# Patient Record
Sex: Female | Born: 1960 | Race: White | Hispanic: No | Marital: Married | Smoking: Former smoker
Health system: Southern US, Community
[De-identification: ages and names within clinical notes are randomized; demographics above are authoritative.]

## PROBLEM LIST (undated history)

## (undated) DIAGNOSIS — I739 Peripheral vascular disease, unspecified: Secondary | ICD-10-CM

## (undated) DIAGNOSIS — D649 Anemia, unspecified: Secondary | ICD-10-CM

## (undated) DIAGNOSIS — N289 Disorder of kidney and ureter, unspecified: Secondary | ICD-10-CM

## (undated) DIAGNOSIS — I10 Essential (primary) hypertension: Secondary | ICD-10-CM

## (undated) DIAGNOSIS — J449 Chronic obstructive pulmonary disease, unspecified: Secondary | ICD-10-CM

## (undated) DIAGNOSIS — N183 Chronic kidney disease, stage 3 unspecified: Secondary | ICD-10-CM

## (undated) DIAGNOSIS — E1129 Type 2 diabetes mellitus with other diabetic kidney complication: Secondary | ICD-10-CM

## (undated) DIAGNOSIS — K219 Gastro-esophageal reflux disease without esophagitis: Secondary | ICD-10-CM

## (undated) DIAGNOSIS — I5032 Chronic diastolic (congestive) heart failure: Secondary | ICD-10-CM

## (undated) DIAGNOSIS — I272 Pulmonary hypertension, unspecified: Secondary | ICD-10-CM

## (undated) DIAGNOSIS — J42 Unspecified chronic bronchitis: Secondary | ICD-10-CM

## (undated) DIAGNOSIS — E669 Obesity, unspecified: Secondary | ICD-10-CM

## (undated) DIAGNOSIS — N186 End stage renal disease: Secondary | ICD-10-CM

## (undated) HISTORY — DX: Anemia, unspecified: D64.9

---

## 2007-03-06 ENCOUNTER — Other Ambulatory Visit: Payer: Self-pay

## 2007-03-06 ENCOUNTER — Emergency Department: Payer: Self-pay | Admitting: Emergency Medicine

## 2012-01-22 ENCOUNTER — Emergency Department: Payer: Self-pay | Admitting: Emergency Medicine

## 2012-11-02 ENCOUNTER — Ambulatory Visit: Payer: Self-pay | Admitting: Physician Assistant

## 2012-11-02 ENCOUNTER — Other Ambulatory Visit: Payer: Self-pay | Admitting: Physician Assistant

## 2012-11-02 LAB — CBC WITH DIFFERENTIAL/PLATELET
Basophil #: 0.1 10*3/uL (ref 0.0–0.1)
Basophil %: 0.8 %
Eosinophil %: 3.9 %
HGB: 13.1 g/dL (ref 12.0–16.0)
Lymphocyte #: 2 10*3/uL (ref 1.0–3.6)
Lymphocyte %: 27.9 %
MCHC: 34.2 g/dL (ref 32.0–36.0)
MCV: 84 fL (ref 80–100)
Monocyte #: 0.4 x10 3/mm (ref 0.2–0.9)
Neutrophil #: 4.3 10*3/uL (ref 1.4–6.5)
Neutrophil %: 61.4 %
Platelet: 172 10*3/uL (ref 150–440)
RBC: 4.54 10*6/uL (ref 3.80–5.20)
RDW: 13.3 % (ref 11.5–14.5)
WBC: 7.1 10*3/uL (ref 3.6–11.0)

## 2012-11-02 LAB — COMPREHENSIVE METABOLIC PANEL
Albumin: 3.1 g/dL — ABNORMAL LOW (ref 3.4–5.0)
Anion Gap: 3 — ABNORMAL LOW (ref 7–16)
Co2: 28 mmol/L (ref 21–32)
Creatinine: 1.21 mg/dL (ref 0.60–1.30)
EGFR (African American): 59 — ABNORMAL LOW
EGFR (Non-African Amer.): 51 — ABNORMAL LOW
Glucose: 220 mg/dL — ABNORMAL HIGH (ref 65–99)
Osmolality: 279 (ref 275–301)
SGOT(AST): 23 U/L (ref 15–37)
SGPT (ALT): 27 U/L (ref 12–78)
Sodium: 136 mmol/L (ref 136–145)
Total Protein: 6.8 g/dL (ref 6.4–8.2)

## 2013-04-07 ENCOUNTER — Emergency Department: Payer: Self-pay | Admitting: Emergency Medicine

## 2013-04-07 LAB — CBC
HCT: 42.3 % (ref 35.0–47.0)
HGB: 13.8 g/dL (ref 12.0–16.0)
MCH: 27.8 pg (ref 26.0–34.0)
MCHC: 32.7 g/dL (ref 32.0–36.0)
MCV: 85 fL (ref 80–100)
PLATELETS: 228 10*3/uL (ref 150–440)
RBC: 4.97 10*6/uL (ref 3.80–5.20)
RDW: 13.8 % (ref 11.5–14.5)
WBC: 10.3 10*3/uL (ref 3.6–11.0)

## 2013-04-07 LAB — URINALYSIS, COMPLETE
BACTERIA: NONE SEEN
BLOOD: NEGATIVE
Bilirubin,UR: NEGATIVE
Glucose,UR: >=500 mg/dL (ref 0–75)
Hyaline Cast: 2
KETONE: NEGATIVE
LEUKOCYTE ESTERASE: NEGATIVE
NITRITE: NEGATIVE
Ph: 5 (ref 4.5–8.0)
Specific Gravity: 1.026 (ref 1.003–1.030)
WBC UR: 7 /HPF (ref 0–5)

## 2013-04-07 LAB — COMPREHENSIVE METABOLIC PANEL
ALT: 16 U/L (ref 12–78)
Albumin: 2.8 g/dL — ABNORMAL LOW (ref 3.4–5.0)
Alkaline Phosphatase: 107 U/L
Anion Gap: 5 — ABNORMAL LOW (ref 7–16)
BUN: 16 mg/dL (ref 7–18)
Bilirubin,Total: 0.4 mg/dL (ref 0.2–1.0)
CALCIUM: 8.9 mg/dL (ref 8.5–10.1)
CO2: 26 mmol/L (ref 21–32)
CREATININE: 1.28 mg/dL (ref 0.60–1.30)
Chloride: 104 mmol/L (ref 98–107)
EGFR (African American): 54 — ABNORMAL LOW
GFR CALC NON AF AMER: 47 — AB
GLUCOSE: 206 mg/dL — AB (ref 65–99)
OSMOLALITY: 277 (ref 275–301)
POTASSIUM: 4.4 mmol/L (ref 3.5–5.1)
SGOT(AST): 23 U/L (ref 15–37)
SODIUM: 135 mmol/L — AB (ref 136–145)
Total Protein: 7.1 g/dL (ref 6.4–8.2)

## 2013-04-07 LAB — TROPONIN I

## 2013-07-13 ENCOUNTER — Emergency Department: Payer: Self-pay | Admitting: Emergency Medicine

## 2013-07-13 LAB — URINALYSIS, COMPLETE
BILIRUBIN, UR: NEGATIVE
Blood: NEGATIVE
Glucose,UR: >=500 mg/dL (ref 0–75)
KETONE: NEGATIVE
Leukocyte Esterase: NEGATIVE
Nitrite: NEGATIVE
Ph: 5 (ref 4.5–8.0)
Specific Gravity: 1.03 (ref 1.003–1.030)

## 2013-07-13 LAB — CBC WITH DIFFERENTIAL/PLATELET
Basophil #: 0 10*3/uL (ref 0.0–0.1)
Basophil %: 0.5 %
EOS ABS: 0.2 10*3/uL (ref 0.0–0.7)
EOS PCT: 4.1 %
HCT: 37.6 % (ref 35.0–47.0)
HGB: 12.3 g/dL (ref 12.0–16.0)
LYMPHS PCT: 26.3 %
Lymphocyte #: 1.4 10*3/uL (ref 1.0–3.6)
MCH: 27.9 pg (ref 26.0–34.0)
MCHC: 32.8 g/dL (ref 32.0–36.0)
MCV: 85 fL (ref 80–100)
Monocyte #: 0.4 x10 3/mm (ref 0.2–0.9)
Monocyte %: 7.9 %
Neutrophil #: 3.3 10*3/uL (ref 1.4–6.5)
Neutrophil %: 61.2 %
Platelet: 171 10*3/uL (ref 150–440)
RBC: 4.42 10*6/uL (ref 3.80–5.20)
RDW: 14 % (ref 11.5–14.5)
WBC: 5.4 10*3/uL (ref 3.6–11.0)

## 2013-07-13 LAB — COMPREHENSIVE METABOLIC PANEL
ANION GAP: 9 (ref 7–16)
AST: 20 U/L (ref 15–37)
Albumin: 2.7 g/dL — ABNORMAL LOW (ref 3.4–5.0)
Alkaline Phosphatase: 92 U/L
BUN: 16 mg/dL (ref 7–18)
Bilirubin,Total: 0.4 mg/dL (ref 0.2–1.0)
CALCIUM: 7.6 mg/dL — AB (ref 8.5–10.1)
Chloride: 105 mmol/L (ref 98–107)
Co2: 21 mmol/L (ref 21–32)
Creatinine: 1.4 mg/dL — ABNORMAL HIGH (ref 0.60–1.30)
EGFR (African American): 49 — ABNORMAL LOW
EGFR (Non-African Amer.): 42 — ABNORMAL LOW
GLUCOSE: 340 mg/dL — AB (ref 65–99)
Osmolality: 285 (ref 275–301)
POTASSIUM: 4.1 mmol/L (ref 3.5–5.1)
SGPT (ALT): 26 U/L (ref 12–78)
Sodium: 135 mmol/L — ABNORMAL LOW (ref 136–145)
Total Protein: 6.7 g/dL (ref 6.4–8.2)

## 2013-12-29 ENCOUNTER — Emergency Department: Payer: Self-pay | Admitting: Emergency Medicine

## 2013-12-29 LAB — COMPREHENSIVE METABOLIC PANEL
ALBUMIN: 2.7 g/dL — AB (ref 3.4–5.0)
ALK PHOS: 96 U/L
ALT: 24 U/L
ANION GAP: 8 (ref 7–16)
AST: 30 U/L (ref 15–37)
BILIRUBIN TOTAL: 0.3 mg/dL (ref 0.2–1.0)
BUN: 25 mg/dL — ABNORMAL HIGH (ref 7–18)
Calcium, Total: 8.1 mg/dL — ABNORMAL LOW (ref 8.5–10.1)
Chloride: 104 mmol/L (ref 98–107)
Co2: 25 mmol/L (ref 21–32)
Creatinine: 1.55 mg/dL — ABNORMAL HIGH (ref 0.60–1.30)
EGFR (African American): 45 — ABNORMAL LOW
EGFR (Non-African Amer.): 37 — ABNORMAL LOW
Glucose: 357 mg/dL — ABNORMAL HIGH (ref 65–99)
OSMOLALITY: 293 (ref 275–301)
POTASSIUM: 4.3 mmol/L (ref 3.5–5.1)
SODIUM: 137 mmol/L (ref 136–145)
TOTAL PROTEIN: 6.8 g/dL (ref 6.4–8.2)

## 2013-12-29 LAB — URINALYSIS, COMPLETE
BILIRUBIN, UR: NEGATIVE
BLOOD: NEGATIVE
Bacteria: NONE SEEN
Glucose,UR: >=500 mg/dL (ref 0–75)
KETONE: NEGATIVE
LEUKOCYTE ESTERASE: NEGATIVE
Nitrite: NEGATIVE
PH: 6 (ref 4.5–8.0)
Protein: >=500
SPECIFIC GRAVITY: 1.028 (ref 1.003–1.030)
Squamous Epithelial: 2

## 2013-12-29 LAB — CBC WITH DIFFERENTIAL/PLATELET
Basophil #: 0.1 10*3/uL (ref 0.0–0.1)
Basophil %: 1.6 %
EOS ABS: 0.3 10*3/uL (ref 0.0–0.7)
Eosinophil %: 3.1 %
HCT: 38.7 % (ref 35.0–47.0)
HGB: 12.8 g/dL (ref 12.0–16.0)
LYMPHS ABS: 1.9 10*3/uL (ref 1.0–3.6)
Lymphocyte %: 22.4 %
MCH: 27.6 pg (ref 26.0–34.0)
MCHC: 33 g/dL (ref 32.0–36.0)
MCV: 84 fL (ref 80–100)
Monocyte #: 0.5 x10 3/mm (ref 0.2–0.9)
Monocyte %: 5.8 %
Neutrophil #: 5.5 10*3/uL (ref 1.4–6.5)
Neutrophil %: 67.1 %
Platelet: 182 10*3/uL (ref 150–440)
RBC: 4.62 10*6/uL (ref 3.80–5.20)
RDW: 13.3 % (ref 11.5–14.5)
WBC: 8.3 10*3/uL (ref 3.6–11.0)

## 2013-12-29 LAB — TROPONIN I

## 2013-12-29 LAB — LIPASE, BLOOD: LIPASE: 810 U/L — AB (ref 73–393)

## 2014-08-25 ENCOUNTER — Encounter: Payer: Self-pay | Admitting: Emergency Medicine

## 2014-08-25 ENCOUNTER — Emergency Department: Payer: Self-pay

## 2014-08-25 ENCOUNTER — Other Ambulatory Visit: Payer: Self-pay

## 2014-08-25 ENCOUNTER — Emergency Department
Admission: EM | Admit: 2014-08-25 | Discharge: 2014-08-26 | Disposition: A | Discharge status: Home / Self Care | Payer: Self-pay | Attending: Emergency Medicine | Admitting: Emergency Medicine

## 2014-08-25 DIAGNOSIS — R0789 Other chest pain: Secondary | ICD-10-CM

## 2014-08-25 DIAGNOSIS — Z79899 Other long term (current) drug therapy: Secondary | ICD-10-CM | POA: Insufficient documentation

## 2014-08-25 DIAGNOSIS — I10 Essential (primary) hypertension: Secondary | ICD-10-CM

## 2014-08-25 DIAGNOSIS — R0602 Shortness of breath: Secondary | ICD-10-CM | POA: Insufficient documentation

## 2014-08-25 DIAGNOSIS — E1165 Type 2 diabetes mellitus with hyperglycemia: Secondary | ICD-10-CM

## 2014-08-25 DIAGNOSIS — Z87891 Personal history of nicotine dependence: Secondary | ICD-10-CM | POA: Insufficient documentation

## 2014-08-25 DIAGNOSIS — IMO0002 Reserved for concepts with insufficient information to code with codable children: Secondary | ICD-10-CM

## 2014-08-25 DIAGNOSIS — R05 Cough: Secondary | ICD-10-CM | POA: Insufficient documentation

## 2014-08-25 DIAGNOSIS — E119 Type 2 diabetes mellitus without complications: Secondary | ICD-10-CM | POA: Insufficient documentation

## 2014-08-25 LAB — BASIC METABOLIC PANEL
ANION GAP: 8 (ref 5–15)
BUN: 24 mg/dL — AB (ref 6–20)
CALCIUM: 8.6 mg/dL — AB (ref 8.9–10.3)
CO2: 23 mmol/L (ref 22–32)
Chloride: 105 mmol/L (ref 101–111)
Creatinine, Ser: 1.79 mg/dL — ABNORMAL HIGH (ref 0.44–1.00)
GFR calc Af Amer: 35 mL/min — ABNORMAL LOW (ref >60)
GFR, EST NON AFRICAN AMERICAN: 31 mL/min — AB (ref >60)
GLUCOSE: 321 mg/dL — AB (ref 65–99)
Potassium: 4.1 mmol/L (ref 3.5–5.1)
SODIUM: 136 mmol/L (ref 135–145)

## 2014-08-25 LAB — CBC
HCT: 37.3 % (ref 35.0–47.0)
HEMOGLOBIN: 12.5 g/dL (ref 12.0–16.0)
MCH: 26.8 pg (ref 26.0–34.0)
MCHC: 33.4 g/dL (ref 32.0–36.0)
MCV: 80.2 fL (ref 80.0–100.0)
PLATELETS: 212 10*3/uL (ref 150–440)
RBC: 4.65 MIL/uL (ref 3.80–5.20)
RDW: 14.9 % — ABNORMAL HIGH (ref 11.5–14.5)
WBC: 8.3 10*3/uL (ref 3.6–11.0)

## 2014-08-25 LAB — TROPONIN I

## 2014-08-25 LAB — GLUCOSE, CAPILLARY: GLUCOSE-CAPILLARY: 254 mg/dL — AB (ref 65–99)

## 2014-08-25 MED ORDER — LABETALOL HCL 5 MG/ML IV SOLN
10.0000 mg | Freq: Once | INTRAVENOUS | Status: AC
  Administered 2014-08-25: 10 mg via INTRAVENOUS
  Filled 2014-08-25: qty 4

## 2014-08-25 MED ORDER — LABETALOL HCL 5 MG/ML IV SOLN: 20.0000 mg | Freq: Once | INTRAVENOUS | Status: DC

## 2014-08-25 MED ORDER — LABETALOL HCL 100 MG PO TABS
100.0000 mg | ORAL_TABLET | Freq: Once | ORAL | Status: AC
  Administered 2014-08-25: 100 mg via ORAL
  Filled 2014-08-25: qty 1

## 2014-08-25 MED ORDER — INSULIN ASPART 100 UNIT/ML ~~LOC~~ SOLN
6.0000 [IU] | Freq: Once | SUBCUTANEOUS | Status: AC
  Administered 2014-08-25: 6 [IU] via SUBCUTANEOUS
  Filled 2014-08-25: qty 6

## 2014-08-25 MED ORDER — ASPIRIN 81 MG PO CHEW
162.0000 mg | CHEWABLE_TABLET | Freq: Once | ORAL | Status: AC
  Administered 2014-08-26: 162 mg via ORAL
  Filled 2014-08-25: qty 2

## 2014-08-25 MED ORDER — SODIUM CHLORIDE 0.9 % IV BOLUS (SEPSIS)
1000.0000 mL | Freq: Once | INTRAVENOUS | Status: AC
  Administered 2014-08-25: 1000 mL via INTRAVENOUS

## 2014-08-25 MED ORDER — IPRATROPIUM-ALBUTEROL 0.5-2.5 (3) MG/3ML IN SOLN
3.0000 mL | Freq: Once | RESPIRATORY_TRACT | Status: AC
  Administered 2014-08-26: 3 mL via RESPIRATORY_TRACT
  Filled 2014-08-25: qty 3

## 2014-08-25 NOTE — ED Provider Notes (Signed)
[facility] Emergency Department Provider Note  ____________________________________________  Time seen: Approximately 9:38 PM  I have reviewed the triage vital signs and the nursing notes.   HISTORY  Chief Complaint Chest Pain and Shortness of Breath    HPI Jacqueline Rodriguez is a 54 y.o. female history of diabetes and hypertension, currently not taking her prescribed medications. She reports that for the last several weeks she has noticed some slight discomfort in her chest along with a cough. She reports that she feels a slight tightness in her chest in the morning, it is not worse with exertion. She reports that she also has diabetes but is not taking her prescribed medications and also has high blood pressure but not taking her prescribed medications because she is too busy taking care of others including her sick mother at home.  She not have any headaches. No numbness or tingling. No weakness in the arms or legs. No vision changes. No abdominal pain or nausea or vomiting. She has no history of heart disease, except for elevated blood pressure. Has a family history of heart disease but not until in dads 53s.  At the present time the patient reports she is not having any shortness of breath or chest pain. Last noticed some this morning.   History reviewed. No pertinent past medical history.  There are no active problems to display for this patient.   History reviewed. No pertinent past surgical history.  Current Outpatient Rx  Name  Route  Sig  Dispense  Refill  . pregabalin (LYRICA) 100 MG capsule   Oral   Take 100 mg by mouth 2 (two) times daily.         [name] glipiZIDE (GLUCOTROL) 5 MG tablet   Oral   Take 0.5 tablets (2.5 mg total) by mouth daily before breakfast.   30 tablet   0   . labetalol (NORMODYNE) 100 MG tablet   Oral   Take 1 tablet (100 mg total) by mouth 2 (two) times daily.   60 tablet   0     Allergies Review of patient's  allergies indicates no known allergies.  History reviewed. No pertinent family history.  Social History Social History  Substance Use Topics  . Smoking status: Former Research scientist (life sciences)  . Smokeless tobacco: None  . Alcohol Use: No    Review of Systems Constitutional: No fever/chills Eyes: No visual changes. ENT: No sore throat. Cardiovascular: See history of present illness Respiratory: Chronic dry cough since age 23, unchanged. Slight shortness of breath in the mornings at times. Gastrointestinal: No abdominal pain.  No nausea, no vomiting.  No diarrhea.  No constipation. Genitourinary: Negative for dysuria. Musculoskeletal: Negative for back pain. Skin: Negative for rash. Neurological: Negative for headaches, focal weakness or numbness.  10-point ROS otherwise negative.  ____________________________________________   PHYSICAL EXAM:  VITAL SIGNS: ED Triage Vitals  Enc Vitals Group     BP 08/25/14 1857 149/68 mmHg     Pulse Rate 08/25/14 1857 84     Resp 08/25/14 1857 20     Temp 08/25/14 1857 98.4 F (36.9 C)     Temp Source 08/25/14 1857 Oral     SpO2 08/25/14 1857 96 %     Weight 08/25/14 1857 220 lb (99.791 kg)     Height 08/25/14 1857 5\' 9"  (1.753 m)     Head Cir --      Peak Flow --      Pain Score 08/25/14 1858 4  Pain Loc --      Pain Edu? --      Excl. in [place]? --     Constitutional: Alert and oriented. Well appearing and in no acute distress. Eyes: Conjunctivae are normal. PERRL. EOMI. Head: Atraumatic. Nose: No congestion/rhinnorhea. Mouth/Throat: Mucous membranes are moist.  Oropharynx non-erythematous. Neck: No stridor.   Cardiovascular: Normal rate, regular rhythm. Grossly normal heart sounds.  Good peripheral circulation. Respiratory: Normal respiratory effort.  No retractions. Lungs CTAB. No wheezes or rales. She does have an occasional dry cough. Gastrointestinal: Soft and nontender. No distention. No abdominal bruits. No CVA  tenderness. Musculoskeletal: No lower extremity tenderness nor edema.  No joint effusions. Neurologic:  Normal speech and language. No gross focal neurologic deficits are appreciated. No gait instability. Skin:  Skin is warm, dry and intact. No rash noted. Psychiatric: Mood and affect are normal. Speech and behavior are normal.  ____________________________________________   LABS (all labs ordered are listed, but only abnormal results are displayed)  Labs Reviewed  BASIC METABOLIC PANEL - Abnormal; Notable for the following:    Glucose, Bld 321 (*)    BUN 24 (*)    Creatinine, Ser 1.79 (*)    Calcium 8.6 (*)    GFR calc non Af Amer 31 (*)    GFR calc Af Amer 35 (*)    All other components within normal limits  CBC - Abnormal; Notable for the following:    RDW 14.9 (*)    All other components within normal limits  GLUCOSE, CAPILLARY - Abnormal; Notable for the following:    Glucose-Capillary 254 (*)    All other components within normal limits  TROPONIN I  TROPONIN I  CBG MONITORING, ED   ____________________________________________  EKG  Reviewed and interpreted by me 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  ____________________________________________  [id number]  DG Chest 2 View (Final result) Result time: 08/25/14 19:28:07   Final result by Rad Results In Interface (08/25/14 19:28:07)   Narrative:   CLINICAL DATA: Chest pain  EXAM: CHEST - 2 VIEW  COMPARISON: 04/07/2013  FINDINGS: Cardiac shadow is within normal limits. The apical density seen previously is less prominent than that seen on the prior exam. No focal infiltrate or sizable effusion is seen. No bony abnormality  is noted.  IMPRESSION: Decrease in left apical density which has been previously shown to represent a benign lesion  No other focal abnormality is noted.    ____________________________________________   PROCEDURES  Procedure(s) performed: None  Critical Care performed: No  ____________________________________________   INITIAL IMPRESSION / ASSESSMENT AND PLAN / ED COURSE  Pertinent labs & imaging results that were available during my care of the patient were reviewed by me and considered in my medical decision making (see chart for details).  Presents for evaluation of chest discomfort which she has experienced in the morning with a dry cough for approximately 2 weeks. Her glucose is also elevated. Her chest pain is atypical in that has been very intermittent and nonexertional and noticed mostly in the morning when waking. She presently has no chest discomfort.  Patient does have elevated blood pressure, and she is not compliant with her previous treatment. In addition, her diabetes is out of control without treatment. She was previously on  metformin, but her creatinine and GFR slowly worsening and I discussed this with her. I highly encouraged her that she needs to get close follow-up and even recommended that she be admitted to the hospital, but because of the care that she provides to her mother, she is unable to stay in the hospital tonight. She is able to see Dr. Caryl Comes, who also sees her husband in follow-up.  I discussed with her that is very important that she get close follow-up, but as she is unable to stay in the hospital even after my recommendation as we rule out acute cardiac disease and also help her to improve her blood pressure and diabetes care, I will provide her with prescriptions for labetalol, a low dose of glipizide and discussed the need to monitor her blood sugars and signs and symptoms and treatment if she does have symptoms of hypoglycemia, as well as  recommend follow-up with Dr. Caryl Comes and cardiology regarding her chest discomfort.  We discussed that without appropriate care, there is a high risk that she could have even worse damaged her kidneys, have a heart attack, stroke, or other catastrophic event or complication from untreated diabetes and high blood pressure. Despite this discussion, she still cannot stay in the hospital, and she seemed very competent and understanding of my recommendation. In lieu of her inability to stay in the hospital we will recommend very close follow-up care provider with prescriptions for control of her diabetes and hypertension.  Her overall heart score is still low risk, and she has a negative troponin. I discussed with her that she develops any severe chest pain, nausea and vomiting, weakness, pain that radiates the arm or neck, or breathlessness that she needs to return the emergency room right away and she is very agreeable with this plan. I discussed with both her and her husband the importance of compliance with medical therapy, and the important need for follow-up. She is agreeable, and I sincerely hope that she will follow-up.  Care and disposition assigned to Dr. Beather Arbour will follow-up on second troponin. If this is normal, we will discharge the patient to home. ____________________________________________   FINAL CLINICAL IMPRESSION(S) / ED DIAGNOSES  Final diagnoses:  Diabetes mellitus type II, uncontrolled  Chest tightness or pressure  Hypertension, uncontrolled      Delman Kitten, MD 08/26/14 [phone]

## 2014-08-25 NOTE — ED Notes (Signed)
[id number]

## 2014-08-25 NOTE — ED Notes (Signed)
Patient to ED with report of chest pain and shortness of breath off and on for several days, patient reports it feels very similar to when she has bronchitis issues.

## 2014-08-25 NOTE — ED Notes (Signed)
Pharmacy called regarding pill form of labetolol, will send to ED

## 2014-08-26 LAB — TROPONIN I: Troponin I: <0.03 ng/mL (ref <0.031)

## 2014-08-26 MED ORDER — LABETALOL HCL 100 MG PO TABS: 100.0000 mg | ORAL_TABLET | Freq: Two times a day (BID) | ORAL | Status: DC

## 2014-08-26 MED ORDER — GLIPIZIDE 5 MG PO TABS: 2.5000 mg | ORAL_TABLET | Freq: Every day | ORAL | Status: DC

## 2014-08-26 NOTE — Discharge Instructions (Signed)
It is super important that you follow-up with a primary care doctor as soon as possible, preferably early this week.  You have been seen in the Emergency Department (ED) today for chest pain.  As we have discussed todays test results are normal, but you may require further testing.  Please follow up with the recommended doctor as instructed above in these documents regarding todays emergent visit and your recent symptoms to discuss further management.  Continue to take your regular medications. If you are not doing so already, please also take a daily baby aspirin (81 mg), at least until you follow up with your doctor.  Return to the Emergency Department (ED) if you experience any further chest pain/pressure/tightness, difficulty breathing, or sudden sweating, or other symptoms that concern you.   Chest Pain Observation It is often hard to give a specific diagnosis for the cause of chest pain. Among other possibilities your symptoms might be caused by inadequate oxygen delivery to your heart (angina). Angina that is not treated or evaluated can lead to a heart attack (myocardial infarction) or death. Blood tests, electrocardiograms, and X-rays may have been done to help determine a possible cause of your chest pain. After evaluation and observation, your health care provider has determined that it is unlikely your pain was caused by an unstable condition that requires hospitalization. However, a full evaluation of your pain may need to be completed, with additional diagnostic testing as directed. It is very important to keep your follow-up appointments. Not keeping your follow-up appointments could result in permanent heart damage, disability, or death. If there is any problem keeping your follow-up appointments, you must call your health care provider. HOME CARE INSTRUCTIONS  Due to the slight chance that your pain could be angina, it is important to follow your health care provider's treatment plan  and also maintain a healthy lifestyle:  Maintain or work toward achieving a healthy weight.  Stay physically active and exercise regularly.  Decrease your salt intake.  Eat a balanced, healthy diet. Talk to a dietitian to learn about heart-healthy foods.  Increase your fiber intake by including whole grains, vegetables, fruits, and nuts in your diet.  Avoid situations that cause stress, anger, or depression.  Take medicines as advised by your health care provider. Report any side effects to your health care provider. Do not stop medicines or adjust the dosages on your own.  Quit smoking. Do not use nicotine patches or gum until you check with your health care provider.  Keep your blood pressure, blood sugar, and cholesterol levels within normal limits.  Limit alcohol intake to no more than 1 drink per day for women who are not pregnant and 2 drinks per day for men.  Do not abuse drugs. SEEK IMMEDIATE MEDICAL CARE IF: You have severe chest pain or pressure which may include symptoms such as:  You feel pain or pressure in your arms, neck, jaw, or back.  You have severe back or abdominal pain, feel sick to your stomach (nauseous), or throw up (vomit).  You are sweating profusely.  You are having a fast or irregular heartbeat.  You feel short of breath while at rest.  You notice increasing shortness of breath during rest, sleep, or with activity.  You have chest pain that does not get better after rest or after taking your usual medicine.  You wake from sleep with chest pain.  You are unable to sleep because you cannot breathe.  You develop a frequent cough or  you are coughing up blood.  You feel dizzy, faint, or experience extreme fatigue.  You develop severe weakness, dizziness, fainting, or chills. Any of these symptoms may represent a serious problem that is an emergency. Do not wait to see if the symptoms will go away. Call your local emergency services (911 in the  [place] Do not drive yourself to the hospital. MAKE SURE YOU:  Understand these instructions.  Will watch your condition.  Will get help right away if you are not doing well or get worse. Document Released: 01/30/2010 Document Revised: 01/02/2013 Document Reviewed: 06/29/2012 [facility] Patient Information 2015 [place], [place]. This information is not intended to replace advice given to you by your health care provider. Make sure you discuss any questions you have with your health care provider.  Type 2 Diabetes Mellitus Type 2 diabetes mellitus, often simply referred to as type 2 diabetes, is a long-lasting (chronic) disease. In type 2 diabetes, the pancreas does not make enough insulin (a hormone), the cells are less responsive to the insulin that is made (insulin resistance), or both. Normally, insulin moves sugars from food into the tissue cells. The tissue cells use the sugars for energy. The lack of insulin or the lack of normal response to insulin causes excess sugars to build up in the blood instead of going into the tissue cells. As a result, high blood sugar (hyperglycemia) develops. The effect of high sugar (glucose) levels can cause many complications. Type 2 diabetes was also previously called adult-onset diabetes, but it can occur at any age.  RISK FACTORS  A person is predisposed to developing type 2 diabetes if someone in the family has the disease and also has one or more of the following primary risk factors:  Overweight.  An inactive lifestyle.  A history of consistently eating high-calorie foods. Maintaining a normal weight and regular physical activity can reduce the chance of developing type 2 diabetes. SYMPTOMS  A person with type 2 diabetes may not show symptoms initially. The symptoms of type 2 diabetes appear slowly. The symptoms include:  Increased thirst (polydipsia).  Increased urination (polyuria).  Increased urination during the night (nocturia).  Weight  loss. This weight loss may be rapid.  Frequent, recurring infections.  Tiredness (fatigue).  Weakness.  Vision changes, such as blurred vision.  Fruity smell to your breath.  Abdominal pain.  Nausea or vomiting.  Cuts or bruises which are slow to heal.  Tingling or numbness in the hands or feet. DIAGNOSIS Type 2 diabetes is frequently not diagnosed until complications of diabetes are present. Type 2 diabetes is diagnosed when symptoms or complications are present and when blood glucose levels are increased. Your blood glucose level may be checked by one or more of the following blood tests:  A fasting blood glucose test. You will not be allowed to eat for at least 8 hours before a blood sample is taken.  A random blood glucose test. Your blood glucose is checked at any time of the day regardless of when you ate.  A hemoglobin A1c blood glucose test. A hemoglobin A1c test provides information about blood glucose control over the previous 3 months.  An oral glucose tolerance test (OGTT). Your blood glucose is measured after you have not eaten (fasted) for 2 hours and then after you drink a glucose-containing beverage. TREATMENT   You may need to take insulin or diabetes medicine daily to keep blood glucose levels in the desired range.  If you use insulin, you may  need to adjust the dosage depending on the carbohydrates that you eat with each meal or snack. The treatment goal is to maintain the before meal blood sugar (preprandial glucose) level at 70-130 mg/dL. HOME CARE INSTRUCTIONS   Have your hemoglobin A1c level checked twice a year.  Perform daily blood glucose monitoring as directed by your health care provider.  Monitor urine ketones when you are ill and as directed by your health care provider.  Take your diabetes medicine or insulin as directed by your health care provider to maintain your blood glucose levels in the desired range.  Never run out of diabetes  medicine or insulin. It is needed every day.  If you are using insulin, you may need to adjust the amount of insulin given based on your intake of carbohydrates. Carbohydrates can raise blood glucose levels but need to be included in your diet. Carbohydrates provide vitamins, minerals, and fiber which are an essential part of a healthy diet. Carbohydrates are found in fruits, vegetables, whole grains, dairy products, legumes, and foods containing added sugars.  Eat healthy foods. You should make an appointment to see a registered dietitian to help you create an eating plan that is right for you.  Lose weight if you are overweight.  Carry a medical alert card or wear your medical alert jewelry.  Carry a 15-gram carbohydrate snack with you at all times to treat low blood glucose (hypoglycemia). Some examples of 15-gram carbohydrate snacks include:  Glucose tablets, 3 or 4.  Glucose gel, 15-gram tube.  Raisins, 2 tablespoons (24 grams).  Jelly beans, 6.  Animal crackers, 8.  Regular pop, 4 ounces (120 mL).  Gummy treats, 9.  Recognize hypoglycemia. Hypoglycemia occurs with blood glucose levels of 70 mg/dL and below. The risk for hypoglycemia increases when fasting or skipping meals, during or after intense exercise, and during sleep. Hypoglycemia symptoms can include:  Tremors or shakes.  Decreased ability to concentrate.  Sweating.  Increased heart rate.  Headache.  Dry mouth.  Hunger.  Irritability.  Anxiety.  Restless sleep.  Altered speech or coordination.  Confusion.  Treat hypoglycemia promptly. If you are alert and able to safely swallow, follow the 15:15 rule:  Take 15-20 grams of rapid-acting glucose or carbohydrate. Rapid-acting options include glucose gel, glucose tablets, or 4 ounces (120 mL) of fruit juice, regular soda, or low-fat milk.  Check your blood glucose level 15 minutes after taking the glucose.  Take 15-20 grams more of glucose if the  repeat blood glucose level is still 70 mg/dL or below.  Eat a meal or snack within 1 hour once blood glucose levels return to normal.  Be alert to feeling very thirsty and urinating more frequently than usual, which are early signs of hyperglycemia. An early awareness of hyperglycemia allows for prompt treatment. Treat hyperglycemia as directed by your health care provider.  Engage in at least 150 minutes of moderate-intensity physical activity a week, spread over at least 3 days of the week or as directed by your health care provider. In addition, you should engage in resistance exercise at least 2 times a week or as directed by your health care provider. Try to spend no more than 90 minutes at one time inactive.  Adjust your medicine and food intake as needed if you start a new exercise or sport.  Follow your sick-day plan anytime you are unable to eat or drink as usual.  Do not use any tobacco products including cigarettes, chewing tobacco, or electronic  cigarettes. If you need help quitting, ask your health care provider.  Limit alcohol intake to no more than 1 drink per day for nonpregnant women and 2 drinks per day for men. You should drink alcohol only when you are also eating food. Talk with your health care provider whether alcohol is safe for you. Tell your health care provider if you drink alcohol several times a week.  Keep all follow-up visits as directed by your health care provider. This is important.  Schedule an eye exam soon after the diagnosis of type 2 diabetes and then annually.  Perform daily skin and foot care. Examine your skin and feet daily for cuts, bruises, redness, nail problems, bleeding, blisters, or sores. A foot exam by a health care provider should be done annually.  Brush your teeth and gums at least twice a day and floss at least once a day. Follow up with your dentist regularly.  Share your diabetes management plan with your workplace or school.  Stay  up-to-date with immunizations. It is recommended that people with diabetes who are over 56 years old get the pneumonia vaccine. In some cases, two separate shots may be given. Ask your health care provider if your pneumonia vaccination is up-to-date.  Learn to manage stress.  Obtain ongoing diabetes education and support as needed.  Participate in or seek rehabilitation as needed to maintain or improve independence and quality of life. Request a physical or occupational therapy referral if you are having foot or hand numbness, or difficulties with grooming, dressing, eating, or physical activity. SEEK MEDICAL CARE IF:   You are unable to eat food or drink fluids for more than 6 hours.  You have nausea and vomiting for more than 6 hours.  Your blood glucose level is over 240 mg/dL.  There is a change in mental status.  You develop an additional serious illness.  You have diarrhea for more than 6 hours.  You have been sick or have had a fever for a couple of days and are not getting better.  You have pain during any physical activity.  SEEK IMMEDIATE MEDICAL CARE IF:  You have difficulty breathing.  You have moderate to large ketone levels. MAKE SURE YOU:  Understand these instructions.  Will watch your condition.  Will get help right away if you are not doing well or get worse. Document Released: 12/28/2004 Document Revised: 05/14/2013 Document Reviewed: 07/27/2011 [facility] Patient Information 2015 [place], [place]. This information is not intended to replace advice given to you by your health care provider. Make sure you discuss any questions you have with your health care provider.

## 2014-08-26 NOTE — ED Provider Notes (Signed)
-----------------------------------------   12:55 AM on 08/26/2014 -----------------------------------------  Repeat troponin is negative. Patient will be discharged home on prescriptions for glipizide and labetalol with instructions as per Dr. Jacqualine Code. Strict return precautions given. Patient verbalizes understanding and agrees with plan of care.  Paulette Blanch, MD 08/26/14 [phone]

## 2014-11-18 ENCOUNTER — Encounter: Payer: Self-pay | Admitting: Emergency Medicine

## 2014-11-18 ENCOUNTER — Emergency Department: Payer: Self-pay

## 2014-11-18 ENCOUNTER — Emergency Department
Admission: EM | Admit: 2014-11-18 | Discharge: 2014-11-18 | Disposition: A | Discharge status: Home / Self Care | Payer: Self-pay | Attending: Emergency Medicine | Admitting: Emergency Medicine

## 2014-11-18 DIAGNOSIS — Z79899 Other long term (current) drug therapy: Secondary | ICD-10-CM | POA: Insufficient documentation

## 2014-11-18 DIAGNOSIS — I10 Essential (primary) hypertension: Secondary | ICD-10-CM | POA: Insufficient documentation

## 2014-11-18 DIAGNOSIS — Z87891 Personal history of nicotine dependence: Secondary | ICD-10-CM | POA: Insufficient documentation

## 2014-11-18 DIAGNOSIS — J189 Pneumonia, unspecified organism: Secondary | ICD-10-CM

## 2014-11-18 DIAGNOSIS — J159 Unspecified bacterial pneumonia: Secondary | ICD-10-CM | POA: Insufficient documentation

## 2014-11-18 DIAGNOSIS — M7592 Shoulder lesion, unspecified, left shoulder: Secondary | ICD-10-CM | POA: Insufficient documentation

## 2014-11-18 DIAGNOSIS — R21 Rash and other nonspecific skin eruption: Secondary | ICD-10-CM | POA: Insufficient documentation

## 2014-11-18 DIAGNOSIS — E119 Type 2 diabetes mellitus without complications: Secondary | ICD-10-CM | POA: Insufficient documentation

## 2014-11-18 DIAGNOSIS — M7591 Shoulder lesion, unspecified, right shoulder: Secondary | ICD-10-CM | POA: Insufficient documentation

## 2014-11-18 LAB — CBC
HCT: 35.5 % (ref 35.0–47.0)
Hemoglobin: 11.7 g/dL — ABNORMAL LOW (ref 12.0–16.0)
MCH: 26.5 pg (ref 26.0–34.0)
MCHC: 32.9 g/dL (ref 32.0–36.0)
MCV: 80.5 fL (ref 80.0–100.0)
PLATELETS: 209 10*3/uL (ref 150–440)
RBC: 4.41 MIL/uL (ref 3.80–5.20)
RDW: 14.1 % (ref 11.5–14.5)
WBC: 14.2 10*3/uL — AB (ref 3.6–11.0)

## 2014-11-18 LAB — BASIC METABOLIC PANEL
Anion gap: 10 (ref 5–15)
BUN: 26 mg/dL — AB (ref 6–20)
CO2: 23 mmol/L (ref 22–32)
CREATININE: 2.16 mg/dL — AB (ref 0.44–1.00)
Calcium: 8.3 mg/dL — ABNORMAL LOW (ref 8.9–10.3)
Chloride: 101 mmol/L (ref 101–111)
GFR calc Af Amer: 28 mL/min — ABNORMAL LOW (ref >60)
GFR, EST NON AFRICAN AMERICAN: 24 mL/min — AB (ref >60)
GLUCOSE: 295 mg/dL — AB (ref 65–99)
Potassium: 4.4 mmol/L (ref 3.5–5.1)
SODIUM: 134 mmol/L — AB (ref 135–145)

## 2014-11-18 MED ORDER — ALBUTEROL SULFATE HFA 108 (90 BASE) MCG/ACT IN AERS: 2.0000 | INHALATION_SPRAY | Freq: Four times a day (QID) | RESPIRATORY_TRACT | Status: DC | PRN

## 2014-11-18 MED ORDER — DEXTROSE 5 % IV SOLN
1.0000 g | Freq: Once | INTRAVENOUS | Status: AC
  Administered 2014-11-18: 1 g via INTRAVENOUS
  Filled 2014-11-18: qty 10

## 2014-11-18 MED ORDER — IPRATROPIUM-ALBUTEROL 0.5-2.5 (3) MG/3ML IN SOLN
3.0000 mL | Freq: Once | RESPIRATORY_TRACT | Status: AC
  Administered 2014-11-18: 3 mL via RESPIRATORY_TRACT
  Filled 2014-11-18: qty 3

## 2014-11-18 MED ORDER — HYDROCOD POLST-CPM POLST ER 10-8 MG/5ML PO SUER
5.0000 mL | Freq: Once | ORAL | Status: AC
  Administered 2014-11-18: 5 mL via ORAL
  Filled 2014-11-18: qty 5

## 2014-11-18 MED ORDER — LEVOFLOXACIN 500 MG PO TABS
750.0000 mg | ORAL_TABLET | Freq: Once | ORAL | Status: AC
  Administered 2014-11-18: 750 mg via ORAL
  Filled 2014-11-18: qty 1

## 2014-11-18 MED ORDER — CEFTRIAXONE SODIUM 1 G IJ SOLR: 1.0000 g | Freq: Once | INTRAMUSCULAR | Status: DC

## 2014-11-18 MED ORDER — HYDROCOD POLST-CPM POLST ER 10-8 MG/5ML PO SUER: 5.0000 mL | Freq: Two times a day (BID) | ORAL | Status: DC | PRN

## 2014-11-18 MED ORDER — LEVOFLOXACIN 750 MG PO TABS: 750.0000 mg | ORAL_TABLET | Freq: Once | ORAL | Status: DC

## 2014-11-18 NOTE — ED Notes (Signed)
States she developed a cough about 3 weeks ago.  Then post left shoulder pain with cont cough began about 2 weeks ago  Unsure of fever .[name] Pain increases with movement and cough

## 2014-11-18 NOTE — ED Provider Notes (Signed)
CSN: [id number]     Arrival date & time 11/18/14  1751 History   First MD Initiated Contact with Patient 11/18/14 1859     Chief Complaint  Patient presents with  . Shoulder Pain     (Consider location/radiation/quality/duration/timing/severity/associated sxs/prior Treatment) HPI  Jacqueline Rodriguez is a 54 yo female who presents with a cough x 3 weeks and left rib pain for 2 and half weeks. Her cough is productive with phlegm but no blood; she has felt fever/chills and some chest tightness. She denies trauma, hemoptysis, SOB, bowel or urinary changes. Last night she could not lie on her left side due to the pain in her left shoulder blade. Pain in the left shoulder blade, left ribs is increased with deep breaths, pain is sharp. She has tried Mucinex cough pills and Mucinex expectorant syrup x 2 days with little improvement. She denies sick contacts. She has secondhand smoke exposure. She has not had a flu shot.   About 1 year ago she had pneumonia and was treated outpatient.  She is not taking her medications for HTN or DM due to expense. She takes Lyrica.  Past Medical History  Diagnosis Date  . Hypertension   . Diabetes mellitus without complication ([place])    History reviewed. No pertinent past surgical history. History reviewed. No pertinent family history. Social History  Substance Use Topics  . Smoking status: Former Research scientist (life sciences)  . Smokeless tobacco: None  . Alcohol Use: No   OB History    No data available     Review of Systems  Constitutional: Positive for fever, chills and activity change (cannot sleep d/t pain). Negative for fatigue.  HENT: Negative for congestion, ear discharge, ear pain, hearing loss and sore throat.   Eyes: Negative for visual disturbance.  Respiratory: Positive for cough and chest tightness. Negative for shortness of breath.   Cardiovascular: Negative for chest pain.  Gastrointestinal: Negative for nausea, vomiting, abdominal pain and diarrhea.  Genitourinary:  Negative for dysuria and difficulty urinating.  Musculoskeletal: Positive for back pain and arthralgias (left shoulder radiating to back and ribs). Negative for gait problem and neck pain.  Skin: Positive for rash (chronic dry skin and scabbing).  Neurological: Negative for weakness, numbness and headaches.  Hematological: Negative for adenopathy.  Psychiatric/Behavioral: Negative for behavioral problems, confusion and agitation.      Allergies  Review of patient's allergies indicates no known allergies.  Home Medications   Prior to Admission medications   Medication Sig Start Date End Date Taking? Authorizing Provider  albuterol (PROVENTIL HFA;VENTOLIN HFA) 108 (90 BASE) MCG/ACT inhaler Inhale 2 puffs into the lungs every 6 (six) hours as needed for wheezing or shortness of breath. 11/18/14   Duanne Guess, PA-C  chlorpheniramine-HYDROcodone (TUSSIONEX) 10-8 MG/5ML SUER Take 5 mLs by mouth every 12 (twelve) hours as needed for cough. 11/18/14   Duanne Guess, PA-C  glipiZIDE (GLUCOTROL) 5 MG tablet Take 0.5 tablets (2.5 mg total) by mouth daily before breakfast. 08/26/14   Delman Kitten, MD  labetalol (NORMODYNE) 100 MG tablet Take 1 tablet (100 mg total) by mouth 2 (two) times daily. 08/26/14   Delman Kitten, MD  levofloxacin (LEVAQUIN) 750 MG tablet Take 1 tablet (750 mg total) by mouth once. 11/18/14   Duanne Guess, PA-C  pregabalin (LYRICA) 100 MG capsule Take 100 mg by mouth 2 (two) times daily.    Historical Provider, MD   BP 182/76 mmHg  Pulse 92  Temp(Src) 98.2 F (36.8 C) (Oral)  Resp 18  Ht 5\' 9"  (1.753 m)  Wt 220 lb (99.791 kg)  BMI 32.47 kg/m2  SpO2 97% Physical Exam  Constitutional: She is oriented to person, place, and time. She appears well-developed and well-nourished. No distress.  HENT:  Head: Normocephalic and atraumatic.  Right Ear: External ear normal.  Left Ear: External ear normal.  Nose: Nose normal.  Mouth/Throat: Oropharynx is clear and moist.  Eyes:  Conjunctivae and EOM are normal. Pupils are equal, round, and reactive to light. Right eye exhibits no discharge. Left eye exhibits no discharge. No scleral icterus.  Neck: Normal range of motion. Neck supple.  Cardiovascular: Normal rate, regular rhythm, normal heart sounds and intact distal pulses.   Pulmonary/Chest: Effort normal. No stridor. No respiratory distress. She exhibits tenderness (Left upper ribs).  Decreased expiratory volume. Patient speaks full sentences.  Abdominal: Soft. Bowel sounds are normal. She exhibits no distension. There is no tenderness. There is no guarding.  Musculoskeletal: Normal range of motion. She exhibits no edema. Tenderness: left superior and inferior scapular border.  Lymphadenopathy:    She has no cervical adenopathy.  Neurological: She is alert and oriented to person, place, and time. She has normal reflexes.  Skin: Skin is warm and dry.  Multiple scabbing lesions < 1 cm on shoulders  Psychiatric: She has a normal mood and affect. Her behavior is normal. Thought content normal.    ED Course  Procedures (including critical care time) Labs Review Labs Reviewed  CBC - Abnormal; Notable for the following:    WBC 14.2 (*)    Hemoglobin 11.7 (*)    All other components within normal limits  BASIC METABOLIC PANEL - Abnormal; Notable for the following:    Sodium 134 (*)    Glucose, Bld 295 (*)    BUN 26 (*)    Creatinine, Ser 2.16 (*)    Calcium 8.3 (*)    GFR calc non Af Amer 24 (*)    GFR calc Af Amer 28 (*)    All other components within normal limits  CULTURE, BLOOD (ROUTINE X 2)  CULTURE, BLOOD (ROUTINE X 2)    Imaging Review Dg Chest 2 View  11/18/2014  CLINICAL DATA:  Three-week history of cough and left shoulder pain. EXAM: CHEST  2 VIEW COMPARISON:  08/25/2014 FINDINGS: The cardiac silhouette, mediastinal and hilar contours are within normal limits and stable. There are bilateral infiltrates most notably in the left lower lobe. Suspect  small associated parapneumonic effusion on the left. IMPRESSION: Bilateral pneumonia. Followup PA and lateral chest X-ray is recommended in 3-4 weeks following trial of antibiotic therapy to ensure resolution and exclude underlying malignancy. Electronically Signed   By: Marijo Sanes M.D.   On: 11/18/2014 19:50   I have personally reviewed and evaluated these images and lab results as part of my medical decision-making.   EKG Interpretation None      MDM   Final diagnoses:  Community acquired pneumonia    54 year old female with bilateral pneumonia. Vital signs are stable. She is given a breathing treatment which did significantly help with air movement. At time of discharge, no chest tightness, chest pain, shortness of breath. Patient was able to take deep breaths with no pain or discomfort. 65 score=1, indicating low probability for mortality, outpatient treatment recommended. Patient was given 1 g of Rocephin IV, blood cultures were ordered. Lab work showed elevated creatinine, 2.16, patient has history of elevated creatinine at baseline. Patient will be discharged with Levaquin 750 mg  by mouth daily 10 days, albuterol inhaler when necessary. Patient will follow-up with PCP or [facility] clinic and 7-10 days for repeat evaluation. The ER for any worsening symptoms urgent changes in health.    Duanne Guess, PA-C 11/18/14 2131  Ahmed Prima, MD 11/18/14 [phone]

## 2014-11-18 NOTE — Discharge Instructions (Signed)
Community-Acquired Pneumonia, Adult Pneumonia is an infection of the lungs. One type of pneumonia can happen while a person is in a hospital. A different type can happen when a person is not in a hospital (community-acquired pneumonia). It is easy for this kind to spread from person to person. It can spread to you if you breathe near an infected person who coughs or sneezes. Some symptoms include:  A dry cough.  A wet (productive) cough.  Fever.  Sweating.  Chest pain. HOME CARE  Take over-the-counter and prescription medicines only as told by your doctor.  Only take cough medicine if you are losing sleep.  If you were prescribed an antibiotic medicine, take it as told by your doctor. Do not stop taking the antibiotic even if you start to feel better.  Sleep with your head and neck raised (elevated). You can do this by putting a few pillows under your head, or you can sleep in a recliner.  Do not use tobacco products. These include cigarettes, chewing tobacco, and e-cigarettes. If you need help quitting, ask your doctor.  Drink enough water to keep your pee (urine) clear or pale yellow. A shot (vaccine) can help prevent pneumonia. Shots are often suggested for:  People older than 56 years of age.  People older than 56 years of age:  Who are having cancer treatment.  Who have long-term (chronic) lung disease.  Who have problems with their body's defense system (immune system). You may also prevent pneumonia if you take these actions:  Get the flu (influenza) shot every year.  Go to the dentist as often as told.  Wash your hands often. If soap and water are not available, use hand sanitizer. GET HELP IF:  You have a fever.  You lose sleep because your cough medicine does not help. GET HELP RIGHT AWAY IF:  You are short of breath and it gets worse.  You have more chest pain.  Your sickness gets worse. This is very serious if:  You are an older adult.  Your  body's defense system is weak.  You cough up blood.   This information is not intended to replace advice given to you by your health care provider. Make sure you discuss any questions you have with your health care provider.   Document Released: 06/16/2007 Document Revised: 09/18/2014 Document Reviewed: 04/24/2014 Elsevier Interactive Patient Education 2016 [place].   Please call  [facility] [place] clinic, schedule appointment with primary care physician in 5-7 days for repeat x-ray. Return to the ER sooner for any worsening symptoms or urgent changes in health.

## 2014-11-18 NOTE — ED Notes (Signed)
Left shoulder pain x 2 weeks.  Also has been coughing a lot.  Yesterday pain worsened to include left back in scapular area.  Worse with cough, deep breath, and movement.

## 2014-11-23 LAB — CULTURE, BLOOD (ROUTINE X 2)
Culture: NO GROWTH
Culture: NO GROWTH

## 2015-01-07 ENCOUNTER — Emergency Department: Payer: Self-pay

## 2015-01-07 DIAGNOSIS — Z79899 Other long term (current) drug therapy: Secondary | ICD-10-CM | POA: Insufficient documentation

## 2015-01-07 DIAGNOSIS — Z87891 Personal history of nicotine dependence: Secondary | ICD-10-CM | POA: Insufficient documentation

## 2015-01-07 DIAGNOSIS — I10 Essential (primary) hypertension: Secondary | ICD-10-CM | POA: Insufficient documentation

## 2015-01-07 DIAGNOSIS — J159 Unspecified bacterial pneumonia: Secondary | ICD-10-CM | POA: Insufficient documentation

## 2015-01-07 DIAGNOSIS — E119 Type 2 diabetes mellitus without complications: Secondary | ICD-10-CM | POA: Insufficient documentation

## 2015-01-07 LAB — CBC
HEMATOCRIT: 32.9 % — AB (ref 35.0–47.0)
Hemoglobin: 10.7 g/dL — ABNORMAL LOW (ref 12.0–16.0)
MCH: 25.6 pg — ABNORMAL LOW (ref 26.0–34.0)
MCHC: 32.6 g/dL (ref 32.0–36.0)
MCV: 78.6 fL — ABNORMAL LOW (ref 80.0–100.0)
PLATELETS: 219 10*3/uL (ref 150–440)
RBC: 4.19 MIL/uL (ref 3.80–5.20)
RDW: 16.4 % — AB (ref 11.5–14.5)
WBC: 12.5 10*3/uL — AB (ref 3.6–11.0)

## 2015-01-07 LAB — COMPREHENSIVE METABOLIC PANEL
ALBUMIN: 2.7 g/dL — AB (ref 3.5–5.0)
ALT: 14 U/L (ref 14–54)
ANION GAP: 7 (ref 5–15)
AST: 18 U/L (ref 15–41)
Alkaline Phosphatase: 75 U/L (ref 38–126)
BILIRUBIN TOTAL: 0.5 mg/dL (ref 0.3–1.2)
BUN: 23 mg/dL — ABNORMAL HIGH (ref 6–20)
CHLORIDE: 105 mmol/L (ref 101–111)
CO2: 22 mmol/L (ref 22–32)
Calcium: 8.4 mg/dL — ABNORMAL LOW (ref 8.9–10.3)
Creatinine, Ser: 1.94 mg/dL — ABNORMAL HIGH (ref 0.44–1.00)
GFR calc Af Amer: 32 mL/min — ABNORMAL LOW (ref >60)
GFR, EST NON AFRICAN AMERICAN: 28 mL/min — AB (ref >60)
GLUCOSE: 236 mg/dL — AB (ref 65–99)
POTASSIUM: 4.1 mmol/L (ref 3.5–5.1)
Sodium: 134 mmol/L — ABNORMAL LOW (ref 135–145)
TOTAL PROTEIN: 7.3 g/dL (ref 6.5–8.1)

## 2015-01-07 MED ORDER — ACETAMINOPHEN 325 MG PO TABS
650.0000 mg | ORAL_TABLET | Freq: Once | ORAL | Status: AC
  Administered 2015-01-07: 650 mg via ORAL

## 2015-01-07 MED ORDER — ACETAMINOPHEN 325 MG PO TABS
ORAL_TABLET | ORAL | Status: AC
  Administered 2015-01-07: 650 mg via ORAL
  Filled 2015-01-07: qty 2

## 2015-01-07 NOTE — ED Notes (Signed)
Pt was dx with pneumonia a month ago and was put on antibiotics.  Pt still co cough and shob states has not improved, also co n.v. No diarrhea.

## 2015-01-08 ENCOUNTER — Emergency Department: Payer: Self-pay

## 2015-01-08 ENCOUNTER — Emergency Department
Admission: EM | Admit: 2015-01-08 | Discharge: 2015-01-08 | Disposition: A | Discharge status: Home / Self Care | Payer: Self-pay | Attending: Emergency Medicine | Admitting: Emergency Medicine

## 2015-01-08 DIAGNOSIS — J189 Pneumonia, unspecified organism: Secondary | ICD-10-CM

## 2015-01-08 MED ORDER — LEVOFLOXACIN 500 MG PO TABS
ORAL_TABLET | ORAL | Status: AC
  Filled 2015-01-08: qty 1

## 2015-01-08 MED ORDER — AZITHROMYCIN 500 MG PO TABS: 500.0000 mg | ORAL_TABLET | Freq: Every day | ORAL | Status: AC

## 2015-01-08 MED ORDER — ALBUTEROL SULFATE HFA 108 (90 BASE) MCG/ACT IN AERS: 2.0000 | INHALATION_SPRAY | Freq: Four times a day (QID) | RESPIRATORY_TRACT | Status: AC | PRN

## 2015-01-08 MED ORDER — LEVOFLOXACIN 750 MG PO TABS: 750.0000 mg | ORAL_TABLET | Freq: Every day | ORAL | Status: DC

## 2015-01-08 MED ORDER — LEVOFLOXACIN 500 MG PO TABS
500.0000 mg | ORAL_TABLET | Freq: Once | ORAL | Status: AC
  Administered 2015-01-08: 500 mg via ORAL

## 2015-01-08 NOTE — ED Notes (Signed)
Meal given to Pt.

## 2015-01-08 NOTE — ED Provider Notes (Signed)
[facility] Emergency Department Provider Note  ____________________________________________  Time seen: 1:00 AM  I have reviewed the triage vital signs and the nursing notes.   HISTORY  Chief Complaint Cough     HPI Jacqueline Rodriguez is a 54 y.o. female presents with cough congestion shortness of breath 3 days accompanied by fever. Patient admits to history of pneumonia which was diagnosed approximately one month ago.    Past Medical History  Diagnosis Date  . Hypertension   . Diabetes mellitus without complication ([place])     There are no active problems to display for this patient.   No past surgical history on file.  Current Outpatient Rx  Name  Route  Sig  Dispense  Refill  . albuterol (PROVENTIL HFA;VENTOLIN HFA) 108 (90 BASE) MCG/ACT inhaler   Inhalation   Inhale 2 puffs into the lungs every 6 (six) hours as needed for wheezing or shortness of breath.   1 Inhaler   2   . chlorpheniramine-HYDROcodone (TUSSIONEX) 10-8 MG/5ML SUER   Oral   Take 5 mLs by mouth every 12 (twelve) hours as needed for cough.   30 mL   0   . glipiZIDE (GLUCOTROL) 5 MG tablet   Oral   Take 0.5 tablets (2.5 mg total) by mouth daily before breakfast.   30 tablet   0   . labetalol (NORMODYNE) 100 MG tablet   Oral   Take 1 tablet (100 mg total) by mouth 2 (two) times daily.   60 tablet   0   . levofloxacin (LEVAQUIN) 750 MG tablet   Oral   Take 1 tablet (750 mg total) by mouth once.   10 tablet   0   . pregabalin (LYRICA) 100 MG capsule   Oral   Take 100 mg by mouth 2 (two) times daily.           Allergies Review of patient's allergies indicates no known allergies.  No family history on file.  Social History Social History  Substance Use Topics  . Smoking status: Former Research scientist (life sciences)  . Smokeless tobacco: Not on file  . Alcohol Use: No    Review of Systems  Constitutional: Negative for fever. Eyes: Negative for visual changes. ENT: Negative  for sore throat. Cardiovascular: Negative for chest pain. Respiratory: Positive for cough and shortness of breath Gastrointestinal: Negative for abdominal pain, vomiting and diarrhea. Genitourinary: Negative for dysuria. Musculoskeletal: Negative for back pain. Skin: Negative for rash. Neurological: Negative for headaches, focal weakness or numbness.   10-point ROS otherwise negative.  ____________________________________________   PHYSICAL EXAM:  VITAL SIGNS: ED Triage Vitals  Enc Vitals Group     BP 01/07/15 1940 195/82 mmHg     Pulse Rate 01/07/15 1940 97     Resp 01/07/15 1940 20     Temp 01/07/15 1940 101.2 F (38.4 C)     Temp Source 01/07/15 1940 Oral     SpO2 01/07/15 1940 93 %     Weight 01/07/15 1940 200 lb (90.719 kg)     Height 01/07/15 1940 5\' 9"  (1.753 m)     Head Cir --      Peak Flow --      Pain Score 01/07/15 1947 5     Pain Loc --      Pain Edu? --      Excl. in [place]? --     Constitutional: Alert and oriented. Well appearing and in no distress. Eyes: Conjunctivae are normal. PERRL. Normal  extraocular movements. ENT   Head: Normocephalic and atraumatic.   Nose: No congestion/rhinnorhea.   Mouth/Throat: Mucous membranes are moist.   Neck: No stridor. Hematological/Lymphatic/Immunilogical: No cervical lymphadenopathy. Cardiovascular: Normal rate, regular rhythm. Normal and symmetric distal pulses are present in all extremities. No murmurs, rubs, or gallops. Respiratory: Normal respiratory effort without tachypnea nor retractions. Breath sounds are clear and equal bilaterally. No wheezes/rales/rhonchi. Gastrointestinal: Soft and nontender. No distention. There is no CVA tenderness. Genitourinary: deferred Musculoskeletal: Nontender with normal range of motion in all extremities. No joint effusions.  No lower extremity tenderness nor edema. Neurologic:  Normal speech and language. No gross focal neurologic deficits are appreciated. Speech is  normal.  Skin:  Skin is warm, dry and intact. No rash noted. Psychiatric: Mood and affect are normal. Speech and behavior are normal. Patient exhibits appropriate insight and judgment.  ____________________________________________    LABS (pertinent positives/negatives)  Labs Reviewed  CBC - Abnormal; Notable for the following:    WBC 12.5 (*)    Hemoglobin 10.7 (*)    HCT 32.9 (*)    MCV 78.6 (*)    MCH 25.6 (*)    RDW 16.4 (*)    All other components within normal limits  COMPREHENSIVE METABOLIC PANEL - Abnormal; Notable for the following:    Sodium 134 (*)    Glucose, Bld 236 (*)    BUN 23 (*)    Creatinine, Ser 1.94 (*)    Calcium 8.4 (*)    Albumin 2.7 (*)    GFR calc non Af Amer 28 (*)    GFR calc Af Amer 32 (*)    All other components within normal limits       RADIOLOGY DG Chest 2 View (Final result) Result time: 01/07/15 20:10:58   Final result by Rad Results In Interface (01/07/15 20:10:58)   Narrative:   CLINICAL DATA: 54 year old female with shortness of breath. Patient was diagnosed with pneumonia a month ago and was treated with antibiotics. Patient still complains of cough and shortness of breath  EXAM: CHEST 2 VIEW  COMPARISON: Chest radiograph dated 11/28/2014 and 04/07/2013  FINDINGS: There has been interval improvement of the previously seen hazy opacities within the lung. There is a stable small left pleural effusion with associated compressive atelectasis versus pneumonia of the left lung base. Stable left apical density as seen on the prior CT, likely scarring Stable cardiac silhouette. The osseous structures appear unremarkable.  IMPRESSION: Persistent small left pleural effusion with associated compressive atelectasis versus pneumonia at the left lung base.   Electronically Signed By: Anner Crete M.D. On: 01/07/2015 20:10      INITIAL IMPRESSION / ASSESSMENT AND PLAN / ED COURSE  Pertinent labs & imaging  results that were available during my care of the patient were reviewed by me and considered in my medical decision making (see chart for details).    ____________________________________________   FINAL CLINICAL IMPRESSION(S) / ED DIAGNOSES  Final diagnoses:  Community acquired pneumonia      Gregor Hams, MD 01/09/15 0800

## 2015-01-08 NOTE — Discharge Instructions (Signed)

## 2015-03-15 ENCOUNTER — Inpatient Hospital Stay
Admission: EM | Admit: 2015-03-15 | Discharge: 2015-03-20 | DRG: 291 | Disposition: A | Discharge status: Home / Self Care | Payer: BLUE CROSS/BLUE SHIELD | Attending: Internal Medicine | Admitting: Internal Medicine

## 2015-03-15 ENCOUNTER — Emergency Department: Payer: BLUE CROSS/BLUE SHIELD

## 2015-03-15 DIAGNOSIS — Z833 Family history of diabetes mellitus: Secondary | ICD-10-CM | POA: Diagnosis not present

## 2015-03-15 DIAGNOSIS — Z87891 Personal history of nicotine dependence: Secondary | ICD-10-CM

## 2015-03-15 DIAGNOSIS — I10 Essential (primary) hypertension: Secondary | ICD-10-CM

## 2015-03-15 DIAGNOSIS — E1122 Type 2 diabetes mellitus with diabetic chronic kidney disease: Secondary | ICD-10-CM | POA: Diagnosis present

## 2015-03-15 DIAGNOSIS — N184 Chronic kidney disease, stage 4 (severe): Secondary | ICD-10-CM | POA: Diagnosis not present

## 2015-03-15 DIAGNOSIS — N179 Acute kidney failure, unspecified: Secondary | ICD-10-CM | POA: Diagnosis present

## 2015-03-15 DIAGNOSIS — I130 Hypertensive heart and chronic kidney disease with heart failure and stage 1 through stage 4 chronic kidney disease, or unspecified chronic kidney disease: Secondary | ICD-10-CM | POA: Diagnosis present

## 2015-03-15 DIAGNOSIS — J9601 Acute respiratory failure with hypoxia: Secondary | ICD-10-CM | POA: Diagnosis present

## 2015-03-15 DIAGNOSIS — Z8249 Family history of ischemic heart disease and other diseases of the circulatory system: Secondary | ICD-10-CM

## 2015-03-15 DIAGNOSIS — E119 Type 2 diabetes mellitus without complications: Secondary | ICD-10-CM

## 2015-03-15 DIAGNOSIS — I43 Cardiomyopathy in diseases classified elsewhere: Secondary | ICD-10-CM | POA: Diagnosis present

## 2015-03-15 DIAGNOSIS — I5033 Acute on chronic diastolic (congestive) heart failure: Secondary | ICD-10-CM

## 2015-03-15 DIAGNOSIS — J9600 Acute respiratory failure, unspecified whether with hypoxia or hypercapnia: Secondary | ICD-10-CM

## 2015-03-15 DIAGNOSIS — D631 Anemia in chronic kidney disease: Secondary | ICD-10-CM | POA: Diagnosis present

## 2015-03-15 DIAGNOSIS — T502X5A Adverse effect of carbonic-anhydrase inhibitors, benzothiadiazides and other diuretics, initial encounter: Secondary | ICD-10-CM | POA: Diagnosis present

## 2015-03-15 DIAGNOSIS — I129 Hypertensive chronic kidney disease with stage 1 through stage 4 chronic kidney disease, or unspecified chronic kidney disease: Secondary | ICD-10-CM | POA: Diagnosis not present

## 2015-03-15 DIAGNOSIS — R809 Proteinuria, unspecified: Secondary | ICD-10-CM | POA: Diagnosis not present

## 2015-03-15 DIAGNOSIS — E785 Hyperlipidemia, unspecified: Secondary | ICD-10-CM | POA: Diagnosis present

## 2015-03-15 DIAGNOSIS — I509 Heart failure, unspecified: Secondary | ICD-10-CM

## 2015-03-15 DIAGNOSIS — Z79899 Other long term (current) drug therapy: Secondary | ICD-10-CM

## 2015-03-15 DIAGNOSIS — N2581 Secondary hyperparathyroidism of renal origin: Secondary | ICD-10-CM | POA: Diagnosis present

## 2015-03-15 DIAGNOSIS — J810 Acute pulmonary edema: Secondary | ICD-10-CM

## 2015-03-15 LAB — TROPONIN I
TROPONIN I: 0.05 ng/mL — AB (ref <0.031)
TROPONIN I: 0.05 ng/mL — AB (ref <0.031)

## 2015-03-15 LAB — COMPREHENSIVE METABOLIC PANEL
ALT: 19 U/L (ref 14–54)
ANION GAP: 7 (ref 5–15)
AST: 25 U/L (ref 15–41)
Albumin: 3.2 g/dL — ABNORMAL LOW (ref 3.5–5.0)
Alkaline Phosphatase: 85 U/L (ref 38–126)
BILIRUBIN TOTAL: 0.5 mg/dL (ref 0.3–1.2)
BUN: 25 mg/dL — AB (ref 6–20)
CO2: 25 mmol/L (ref 22–32)
Calcium: 8.7 mg/dL — ABNORMAL LOW (ref 8.9–10.3)
Chloride: 105 mmol/L (ref 101–111)
Creatinine, Ser: 2.37 mg/dL — ABNORMAL HIGH (ref 0.44–1.00)
GFR calc Af Amer: 25 mL/min — ABNORMAL LOW (ref >60)
GFR, EST NON AFRICAN AMERICAN: 22 mL/min — AB (ref >60)
Glucose, Bld: 176 mg/dL — ABNORMAL HIGH (ref 65–99)
POTASSIUM: 4.9 mmol/L (ref 3.5–5.1)
Sodium: 137 mmol/L (ref 135–145)
TOTAL PROTEIN: 7.1 g/dL (ref 6.5–8.1)

## 2015-03-15 LAB — CBC
HEMATOCRIT: 35.9 % (ref 35.0–47.0)
Hemoglobin: 11.6 g/dL — ABNORMAL LOW (ref 12.0–16.0)
MCH: 26.3 pg (ref 26.0–34.0)
MCHC: 32.4 g/dL (ref 32.0–36.0)
MCV: 81.2 fL (ref 80.0–100.0)
Platelets: 179 10*3/uL (ref 150–440)
RBC: 4.42 MIL/uL (ref 3.80–5.20)
RDW: 16.7 % — AB (ref 11.5–14.5)
WBC: 9.9 10*3/uL (ref 3.6–11.0)

## 2015-03-15 LAB — MRSA PCR SCREENING: MRSA BY PCR: NEGATIVE

## 2015-03-15 LAB — BRAIN NATRIURETIC PEPTIDE: B Natriuretic Peptide: 415 pg/mL — ABNORMAL HIGH (ref 0.0–100.0)

## 2015-03-15 MED ORDER — LABETALOL HCL 5 MG/ML IV SOLN: 10.0000 mg | INTRAVENOUS | Status: DC | PRN

## 2015-03-15 MED ORDER — DOCUSATE SODIUM 100 MG PO CAPS
100.0000 mg | ORAL_CAPSULE | Freq: Two times a day (BID) | ORAL | Status: DC
  Administered 2015-03-15 – 2015-03-18 (×6): 100 mg via ORAL
  Filled 2015-03-15 (×8): qty 1

## 2015-03-15 MED ORDER — ALBUTEROL SULFATE HFA 108 (90 BASE) MCG/ACT IN AERS: 2.0000 | INHALATION_SPRAY | Freq: Four times a day (QID) | RESPIRATORY_TRACT | Status: DC | PRN

## 2015-03-15 MED ORDER — SODIUM CHLORIDE 0.9 % IV BOLUS (SEPSIS): 500.0000 mL | Freq: Once | INTRAVENOUS | Status: DC

## 2015-03-15 MED ORDER — ACETAMINOPHEN 650 MG RE SUPP: 650.0000 mg | Freq: Four times a day (QID) | RECTAL | Status: DC | PRN

## 2015-03-15 MED ORDER — IPRATROPIUM-ALBUTEROL 0.5-2.5 (3) MG/3ML IN SOLN
3.0000 mL | Freq: Four times a day (QID) | RESPIRATORY_TRACT | Status: DC
  Administered 2015-03-15 – 2015-03-20 (×19): 3 mL via RESPIRATORY_TRACT
  Filled 2015-03-15 (×20): qty 3

## 2015-03-15 MED ORDER — GLIPIZIDE 5 MG PO TABS
5.0000 mg | ORAL_TABLET | Freq: Every day | ORAL | Status: DC
  Administered 2015-03-16 – 2015-03-17 (×2): 5 mg via ORAL
  Filled 2015-03-15 (×2): qty 1

## 2015-03-15 MED ORDER — INSULIN ASPART 100 UNIT/ML ~~LOC~~ SOLN
0.0000 [IU] | Freq: Three times a day (TID) | SUBCUTANEOUS | Status: DC
  Administered 2015-03-17: 2 [IU] via SUBCUTANEOUS
  Administered 2015-03-18 (×2): 1 [IU] via SUBCUTANEOUS
  Administered 2015-03-18 – 2015-03-19 (×2): 2 [IU] via SUBCUTANEOUS
  Administered 2015-03-19 – 2015-03-20 (×2): 1 [IU] via SUBCUTANEOUS
  Filled 2015-03-15: qty 1
  Filled 2015-03-15: qty 2
  Filled 2015-03-15 (×3): qty 1
  Filled 2015-03-15 (×2): qty 2

## 2015-03-15 MED ORDER — BISACODYL 10 MG RE SUPP: 10.0000 mg | Freq: Every day | RECTAL | Status: DC | PRN

## 2015-03-15 MED ORDER — MORPHINE SULFATE (PF) 2 MG/ML IV SOLN: 2.0000 mg | INTRAVENOUS | Status: DC | PRN

## 2015-03-15 MED ORDER — ONDANSETRON HCL 4 MG/2ML IJ SOLN: 4.0000 mg | Freq: Four times a day (QID) | INTRAMUSCULAR | Status: DC | PRN

## 2015-03-15 MED ORDER — FUROSEMIDE 10 MG/ML IJ SOLN
40.0000 mg | Freq: Two times a day (BID) | INTRAMUSCULAR | Status: DC
  Administered 2015-03-15 – 2015-03-16 (×2): 40 mg via INTRAVENOUS
  Filled 2015-03-15: qty 4

## 2015-03-15 MED ORDER — PREGABALIN 50 MG PO CAPS
100.0000 mg | ORAL_CAPSULE | Freq: Two times a day (BID) | ORAL | Status: DC
  Administered 2015-03-15 – 2015-03-17 (×4): 100 mg via ORAL
  Filled 2015-03-15 (×5): qty 2

## 2015-03-15 MED ORDER — NITROGLYCERIN 2 % TD OINT
2.0000 [in_us] | TOPICAL_OINTMENT | Freq: Four times a day (QID) | TRANSDERMAL | Status: DC
  Administered 2015-03-15 – 2015-03-19 (×12): 2 [in_us] via TOPICAL
  Filled 2015-03-15 (×12): qty 2

## 2015-03-15 MED ORDER — ALBUTEROL SULFATE (2.5 MG/3ML) 0.083% IN NEBU: 2.5000 mg | INHALATION_SOLUTION | Freq: Four times a day (QID) | RESPIRATORY_TRACT | Status: DC | PRN

## 2015-03-15 MED ORDER — NITROGLYCERIN 2 % TD OINT
1.0000 [in_us] | TOPICAL_OINTMENT | Freq: Once | TRANSDERMAL | Status: AC
  Administered 2015-03-15: 1 [in_us] via TOPICAL
  Filled 2015-03-15: qty 1

## 2015-03-15 MED ORDER — FUROSEMIDE 10 MG/ML IJ SOLN
60.0000 mg | Freq: Once | INTRAMUSCULAR | Status: DC
  Filled 2015-03-15: qty 8

## 2015-03-15 MED ORDER — IPRATROPIUM-ALBUTEROL 0.5-2.5 (3) MG/3ML IN SOLN
3.0000 mL | Freq: Once | RESPIRATORY_TRACT | Status: AC
  Administered 2015-03-15: 3 mL via RESPIRATORY_TRACT
  Filled 2015-03-15: qty 3

## 2015-03-15 MED ORDER — ASPIRIN EC 81 MG PO TBEC
81.0000 mg | DELAYED_RELEASE_TABLET | Freq: Every day | ORAL | Status: DC
  Administered 2015-03-16 – 2015-03-20 (×5): 81 mg via ORAL
  Filled 2015-03-15 (×5): qty 1

## 2015-03-15 MED ORDER — ENOXAPARIN SODIUM 40 MG/0.4ML ~~LOC~~ SOLN
40.0000 mg | SUBCUTANEOUS | Status: DC
  Administered 2015-03-15: 40 mg via SUBCUTANEOUS
  Filled 2015-03-15: qty 0.4

## 2015-03-15 MED ORDER — DEXTROSE 5 % IV SOLN
1.0000 g | INTRAVENOUS | Status: DC
  Administered 2015-03-15 – 2015-03-16 (×2): 1 g via INTRAVENOUS
  Filled 2015-03-15 (×3): qty 10

## 2015-03-15 MED ORDER — ASPIRIN 81 MG PO CHEW
324.0000 mg | CHEWABLE_TABLET | Freq: Once | ORAL | Status: AC
  Administered 2015-03-15: 324 mg via ORAL
  Filled 2015-03-15: qty 4

## 2015-03-15 MED ORDER — SODIUM CHLORIDE 0.9 % IV SOLN: 250.0000 mL | INTRAVENOUS | Status: DC | PRN

## 2015-03-15 MED ORDER — SODIUM CHLORIDE 0.9% FLUSH: 3.0000 mL | INTRAVENOUS | Status: DC | PRN

## 2015-03-15 MED ORDER — ONDANSETRON HCL 4 MG PO TABS: 4.0000 mg | ORAL_TABLET | Freq: Four times a day (QID) | ORAL | Status: DC | PRN

## 2015-03-15 MED ORDER — SODIUM CHLORIDE 0.9% FLUSH
3.0000 mL | Freq: Two times a day (BID) | INTRAVENOUS | Status: DC
  Administered 2015-03-15 – 2015-03-16 (×2): 3 mL via INTRAVENOUS

## 2015-03-15 MED ORDER — ACETAMINOPHEN 325 MG PO TABS
650.0000 mg | ORAL_TABLET | Freq: Four times a day (QID) | ORAL | Status: DC | PRN
  Administered 2015-03-16 – 2015-03-17 (×5): 650 mg via ORAL
  Filled 2015-03-15 (×5): qty 2

## 2015-03-15 MED ORDER — HYDRALAZINE HCL 20 MG/ML IJ SOLN
10.0000 mg | Freq: Once | INTRAMUSCULAR | Status: AC
Start: 2015-03-15 — End: 2015-03-15
  Administered 2015-03-15: 10 mg via INTRAVENOUS
  Filled 2015-03-15: qty 1

## 2015-03-15 MED ORDER — SODIUM CHLORIDE 0.9% FLUSH
3.0000 mL | Freq: Two times a day (BID) | INTRAVENOUS | Status: DC
  Administered 2015-03-16 – 2015-03-20 (×8): 3 mL via INTRAVENOUS

## 2015-03-15 MED ORDER — METOPROLOL TARTRATE 25 MG PO TABS
25.0000 mg | ORAL_TABLET | Freq: Two times a day (BID) | ORAL | Status: DC
  Administered 2015-03-15 – 2015-03-19 (×9): 25 mg via ORAL
  Filled 2015-03-15 (×10): qty 1

## 2015-03-15 MED ORDER — LABETALOL HCL 5 MG/ML IV SOLN
20.0000 mg | Freq: Once | INTRAVENOUS | Status: AC
  Administered 2015-03-15: 20 mg via INTRAVENOUS
  Filled 2015-03-15: qty 4

## 2015-03-15 NOTE — Progress Notes (Signed)
Pt currently on 2L O2 via nasal cannula, sats 100% after settling back in bed from using bedside commode with assistance.

## 2015-03-15 NOTE — ED Notes (Signed)
Pt came to ED c/o worsening of SOB that started this morning. Pt satting 88% r/a. Pt denies chest pain. Pt was in ED in January for SOB as well. Pt reports this is worse. BS 188. Pt takes metformin, no insulin.

## 2015-03-15 NOTE — ED Notes (Addendum)
Pt placed on bipap  

## 2015-03-15 NOTE — H&P (Signed)
History and Physical    Jacqueline Rodriguez [id number] DOB: [birth year] DOA: 03/15/2015  Referring physician: Dr. Kerman Passey PCP: Pcp Not In System  Specialists: none  Chief Complaint: SOB  HPI: Jacqueline Rodriguez is a 55 y.o. female has a past medical history significant for HTN and Dm now with 1-2 month hx of worsening SOB and malaise. No cardiac hx. Denies CP. Has had weight gain and LE edema. In ER, pt was in acute respiratory distress requiring BiPAP. Initial SBP>200 with CXR showing CHF. She is now admitted. No fever. Not a smoker. No N/V/D.  Review of Systems: The patient denies anorexia, fever, weight loss,, vision loss, decreased hearing, hoarseness, chest pain, syncope,  balance deficits, hemoptysis, abdominal pain, melena, hematochezia, severe indigestion/heartburn, hematuria, incontinence, genital sores, muscle weakness, suspicious skin lesions, transient blindness, difficulty walking, depression, unusual weight change, abnormal bleeding, enlarged lymph nodes, angioedema, and breast masses.   Past Medical History  Diagnosis Date  . Hypertension   . Diabetes mellitus without complication ([place])    History reviewed. No pertinent past surgical history. Social History:  reports that she has quit smoking. She does not have any smokeless tobacco history on file. She reports that she does not drink alcohol or use illicit drugs.  No Known Allergies  Family History  Problem Relation Age of Onset  . Hypertension Mother   . Diabetes Mellitus II Mother   . CAD Father   . Hypertension Father     Prior to Admission medications   Medication Sig Start Date End Date Taking? Authorizing Provider  albuterol (PROVENTIL HFA;VENTOLIN HFA) 108 (90 BASE) MCG/ACT inhaler Inhale 2 puffs into the lungs every 6 (six) hours as needed for wheezing or shortness of breath. 11/18/14   Duanne Guess, PA-C  albuterol (PROVENTIL HFA;VENTOLIN HFA) 108 (90 Base) MCG/ACT inhaler Inhale 2 puffs into the lungs every 6  (six) hours as needed for wheezing or shortness of breath. 01/08/15   Gregor Hams, MD  chlorpheniramine-HYDROcodone (TUSSIONEX) 10-8 MG/5ML SUER Take 5 mLs by mouth every 12 (twelve) hours as needed for cough. 11/18/14   Duanne Guess, PA-C  glipiZIDE (GLUCOTROL) 5 MG tablet Take 0.5 tablets (2.5 mg total) by mouth daily before breakfast. 08/26/14   Delman Kitten, MD  labetalol (NORMODYNE) 100 MG tablet Take 1 tablet (100 mg total) by mouth 2 (two) times daily. 08/26/14   Delman Kitten, MD  levofloxacin (LEVAQUIN) 750 MG tablet Take 1 tablet (750 mg total) by mouth daily. 01/08/15   Gregor Hams, MD  pregabalin (LYRICA) 100 MG capsule Take 100 mg by mouth 2 (two) times daily.    Historical Provider, MD   Physical Exam: Filed Vitals:   03/15/15 1715 03/15/15 1717 03/15/15 1720 03/15/15 1722  BP: 166/78 160/81 162/78 156/73  Pulse: 74 74 75 74  Temp:      TempSrc:      Resp: 19 22 20 19   Height:      Weight:      SpO2: 95% 97% 97% 98%     General:  Acutely ill appearing in moderate respiratory distress  Eyes: PERRL, EOMI, no scleral icterus, conjunctiva clear  ENT: moist oropharynx with exudate or lesions. Tm's benign.  Neck: supple, no lymphadenopathy. No thyromegaly or bruits  Cardiovascular: regular rate with 2/6 systolic murmur noted. No rubs or gallops.; 2+ peripheral pulses, 2+ JVD, 1+ peripheral edema  Respiratory: rales 1/3 way up bilaterally without wheezes or rhonchi. Respiratory effort increased, no dullness  Abdomen:  soft, non tender to palpation, positive bowel sounds, no guarding, no rebound, no organomegaly  Skin: no rashes or lesions  Musculoskeletal: normal bulk and tone, no joint swelling  Psychiatric: normal mood and affect, A&OX3  Neurologic: CN 2-12 grossly intact, Motor strength 5/5 in all 4 groups with normal DTR's and sensory exam  Labs on Admission:  Basic Metabolic Panel:  Recent Labs Lab 03/15/15 1601  NA 137  K 4.9  CL 105  CO2 25   GLUCOSE 176*  BUN 25*  CREATININE 2.37*  CALCIUM 8.7*   Liver Function Tests:  Recent Labs Lab 03/15/15 1601  AST 25  ALT 19  ALKPHOS 85  BILITOT 0.5  PROT 7.1  ALBUMIN 3.2*   No results for input(s): LIPASE, AMYLASE in the last 168 hours. No results for input(s): AMMONIA in the last 168 hours. CBC:  Recent Labs Lab 03/15/15 1601  WBC 9.9  HGB 11.6*  HCT 35.9  MCV 81.2  PLT 179   Cardiac Enzymes:  Recent Labs Lab 03/15/15 1601  TROPONINI 0.05*    BNP (last 3 results)  Recent Labs  03/15/15 1601  BNP 415.0*    ProBNP (last 3 results) No results for input(s): PROBNP in the last 8760 hours.  CBG: No results for input(s): GLUCAP in the last 168 hours.  Radiological Exams on Admission: Dg Chest Portable 1 View  03/15/2015  CLINICAL DATA:  Worsening shortness of breath and hypoxia. EXAM: PORTABLE CHEST 1 VIEW COMPARISON:  01/07/2015 FINDINGS: Heart size remains at the upper limit of normal. Increased diffuse interstitial infiltrates are seen with atelectasis or consolidation in the right perihilar region. Increased bilateral pleural effusions are noted, left side greater than right. IMPRESSION: Findings consistent with increased pulmonary edema and bilateral pleural effusions, likely due to congestive heart failure. Increased right perihilar atelectasis versus superimposed pneumonia. Electronically Signed   By: Earle Gell M.D.   On: 03/15/2015 16:23    EKG: Independently reviewed.  Assessment/Plan Principal Problem:   Acute respiratory failure (HCC) Active Problems:   CHF (congestive heart failure) ([place])   Malignant hypertension   Diabetes mellitus without complication ([place])   Will admit to ICU with BiPAP. Follow enzymes and order echo. Consult Cardiology. Check D-dimer. Optimize BP and volume status with nitrates and IV lasix. Repeat labs in AM. Wean BiPAP as tolerated.  Diet: clear liquids Fluids: none DVT Prophylaxis: Lovenox  Code Status: FULL   Family Communication: none  Disposition Plan: home  Time spent: 55 min

## 2015-03-15 NOTE — Progress Notes (Signed)
Herman called into room and wants patient to be transitioned to nasal cannula and off bipap since she looks comfortable, and is only on 35%. Called Respiratory Therapist to inform.

## 2015-03-15 NOTE — ED Notes (Signed)
Iv inserted in left ac for acuity and sob with potential further testing

## 2015-03-15 NOTE — Progress Notes (Signed)
eLink Physician-Brief Progress Note Patient Name: Jacqueline Rodriguez DOB: 1960/03/31 MRN: [id number]   Date of Service  03/15/2015  HPI/Events of Note  Acute pulmonary edema  htn emergency Appears comfortable on bipap Not much diuresis with lasix  eICU Interventions  Attempt trial off bipap Bp much improved     Intervention Category Evaluation Type: New Patient Evaluation  ALVA,RAKESH V. 03/15/2015, 8:24 PM

## 2015-03-15 NOTE — Progress Notes (Signed)
Patient off bipap. States breathing is much better. BBS clear to diminished at bases. Vitals stable. Transitioned over to 2 liter nasal cannula. Will continue to monitor

## 2015-03-15 NOTE — ED Provider Notes (Signed)
[facility] Emergency Department Provider Note  Time seen: 3:56 PM  I have reviewed the triage vital signs and the nursing notes.   HISTORY  Chief Complaint Shortness of Breath    HPI Jacqueline Rodriguez is a 55 y.o. female with a past medical history of hypertension, hyperlipidemia, presents the emergency department for difficulty breathing. According to the patient for the past one week she has been having progressively worsening shortness of breath. She also states intermittent chest tightness sensation especially to the back of her chest. Denies any frontal chest pain. She does note for the past one week she has been having lower extremity swelling which is new for her. Denies any leg pain. Describes her chest tightness is moderate, shortness of breath is moderate to severe.     Past Medical History  Diagnosis Date  . Hypertension   . Diabetes mellitus without complication ([place])     There are no active problems to display for this patient.   History reviewed. No pertinent past surgical history.  Current Outpatient Rx  Name  Route  Sig  Dispense  Refill  . albuterol (PROVENTIL HFA;VENTOLIN HFA) 108 (90 BASE) MCG/ACT inhaler   Inhalation   Inhale 2 puffs into the lungs every 6 (six) hours as needed for wheezing or shortness of breath.   1 Inhaler   2   . albuterol (PROVENTIL HFA;VENTOLIN HFA) 108 (90 Base) MCG/ACT inhaler   Inhalation   Inhale 2 puffs into the lungs every 6 (six) hours as needed for wheezing or shortness of breath.   1 Inhaler   2   . chlorpheniramine-HYDROcodone (TUSSIONEX) 10-8 MG/5ML SUER   Oral   Take 5 mLs by mouth every 12 (twelve) hours as needed for cough.   30 mL   0   . glipiZIDE (GLUCOTROL) 5 MG tablet   Oral   Take 0.5 tablets (2.5 mg total) by mouth daily before breakfast.   30 tablet   0   . labetalol (NORMODYNE) 100 MG tablet   Oral   Take 1 tablet (100 mg total) by mouth 2 (two) times daily.   60 tablet    0   . levofloxacin (LEVAQUIN) 750 MG tablet   Oral   Take 1 tablet (750 mg total) by mouth daily.   10 tablet   0   . pregabalin (LYRICA) 100 MG capsule   Oral   Take 100 mg by mouth 2 (two) times daily.           Allergies Review of patient's allergies indicates no known allergies.  No family history on file.  Social History Social History  Substance Use Topics  . Smoking status: Former Research scientist (life sciences)  . Smokeless tobacco: None  . Alcohol Use: No    Review of Systems Constitutional: Negative for fever. Cardiovascular: Positive for chest tightness Respiratory: Positive for shortness of breath Gastrointestinal: Negative for abdominal pain Musculoskeletal: Negative for back pain. Negative for leg pain. Positive for leg swelling. Neurological: Negative for headache 10-point ROS otherwise negative.  ____________________________________________   PHYSICAL EXAM:  VITAL SIGNS: ED Triage Vitals  Enc Vitals Group     BP 03/15/15 1551 254/162 mmHg     Pulse Rate 03/15/15 1551 98     Resp 03/15/15 1551 20     Temp 03/15/15 1551 97.8 F (36.6 C)     Temp Source 03/15/15 1551 Oral     SpO2 03/15/15 1551 88 %     Weight 03/15/15 1551 201  lb (91.173 kg)     Height 03/15/15 1551 5\' 9"  (1.753 m)     Head Cir --      Peak Flow --      Pain Score --      Pain Loc --      Pain Edu? --      Excl. in [place]? --     Constitutional: Alert and oriented. Well appearing and in no distress. Eyes: Normal exam ENT   Head: Normocephalic and atraumatic.   Mouth/Throat: Mucous membranes are moist. Cardiovascular: Normal rate, regular rhythm. No murmur Respiratory: Normal respiratory effort without tachypnea nor retractions. Mild expiratory wheeze bilaterally. Gastrointestinal: Soft and nontender. No distention.   Musculoskeletal: Nontender with normal range of motion in all extremities. 1+ lower extremity edema bilaterally. No calf tenderness. Neurologic:  Normal speech and language.  No gross focal neurologic deficits Skin:  Skin is warm, dry and intact.  Psychiatric: Mood and affect are normal. Speech and behavior are normal.  ____________________________________________    EKG  EKG reviewed and interpreted by myself appears to show normal sinus rhythm (contrary to computer read) at 99 bpm, appears to have a normal PR interval, narrow QRS, left axis deviation, nonspecific ST changes. Largely unchanged from prior.  ____________________________________________    RADIOLOGY  Chest x-ray consistent pulmonary edema  ____________________________________________   INITIAL IMPRESSION / ASSESSMENT AND PLAN / ED COURSE  Pertinent labs & imaging results that were available during my care of the patient were reviewed by me and considered in my medical decision making (see chart for details).  Patient presents the emergency department 1 week of progressive worsening shortness breath, somewhat worse today. Patient is satting 88% on room air, has no home O2 requirement. Patient has mild expiratory wheeze bilaterally. 1+ lower extremity edema bilaterally which is new for the past one week. Patient's blood pressure significantly elevated 254/162. Given the patient's significant hypertension, lower extremity edema, suspect likely new CHF. We will check labs including troponin and BNP, chest x-ray. We'll place 1 inch of nitroglycerin ointment, and 10 mg of hydralazine IV, and monitor closely in the emergency department.  Chest x-ray consistent with pulmonary edema, patient remains hypoxic 88-89 percent on 6 L nasal cannula, placed on a nonrebreather, ordered BiPAP.  Patient currently on BiPAP, breathing much better with 100% O2 saturation. However the patient has pulmonary edema on chest x-ray remains hypertensive currently 260/138. Patient has received IV hydralazine, I have now dosed IV labetalol. The blood pressure does not decrease and IV labetalol we will start on a  drip.  Blood pressures responded well to IV labetalol. Patient states she is feeling much better on BiPAP. We will admit to the hospital for further treatment. IV Lasix given in the emergency department.  CRITICAL CARE Performed by: Harvest Dark   Total critical care time: 60 minutes  Critical care time was exclusive of separately billable procedures and treating other patients.  Critical care was necessary to treat or prevent imminent or life-threatening deterioration.  Critical care was time spent personally by me on the following activities: development of treatment plan with patient and/or surrogate as well as nursing, discussions with consultants, evaluation of patient's response to treatment, examination of patient, obtaining history from patient or surrogate, ordering and performing treatments and interventions, ordering and review of laboratory studies, ordering and review of radiographic studies, pulse oximetry and re-evaluation of patient's condition.   ____________________________________________   FINAL CLINICAL IMPRESSION(S) / ED DIAGNOSES  Dyspnea Hypoxia Pulmonary edema Congestive  heart failure   Harvest Dark, MD 03/15/15 [phone]

## 2015-03-16 ENCOUNTER — Inpatient Hospital Stay
Admit: 2015-03-16 | Discharge: 2015-03-16 | Disposition: A | Discharge status: Home / Self Care | Payer: BLUE CROSS/BLUE SHIELD | Attending: Internal Medicine | Admitting: Internal Medicine

## 2015-03-16 LAB — GLUCOSE, CAPILLARY
GLUCOSE-CAPILLARY: 155 mg/dL — AB (ref 65–99)
Glucose-Capillary: 118 mg/dL — ABNORMAL HIGH (ref 65–99)
Glucose-Capillary: 128 mg/dL — ABNORMAL HIGH (ref 65–99)
Glucose-Capillary: 75 mg/dL (ref 65–99)

## 2015-03-16 LAB — CBC
HCT: 31.9 % — ABNORMAL LOW (ref 35.0–47.0)
HEMOGLOBIN: 10.4 g/dL — AB (ref 12.0–16.0)
MCH: 26.1 pg (ref 26.0–34.0)
MCHC: 32.5 g/dL (ref 32.0–36.0)
MCV: 80.1 fL (ref 80.0–100.0)
Platelets: 174 10*3/uL (ref 150–440)
RBC: 3.98 MIL/uL (ref 3.80–5.20)
RDW: 16.1 % — ABNORMAL HIGH (ref 11.5–14.5)
WBC: 7.2 10*3/uL (ref 3.6–11.0)

## 2015-03-16 LAB — COMPREHENSIVE METABOLIC PANEL
ALBUMIN: 2.7 g/dL — AB (ref 3.5–5.0)
ALT: 17 U/L (ref 14–54)
ANION GAP: 7 (ref 5–15)
AST: 22 U/L (ref 15–41)
Alkaline Phosphatase: 76 U/L (ref 38–126)
BUN: 29 mg/dL — ABNORMAL HIGH (ref 6–20)
CHLORIDE: 104 mmol/L (ref 101–111)
CO2: 25 mmol/L (ref 22–32)
CREATININE: 2.7 mg/dL — AB (ref 0.44–1.00)
Calcium: 8.2 mg/dL — ABNORMAL LOW (ref 8.9–10.3)
GFR calc non Af Amer: 18 mL/min — ABNORMAL LOW (ref >60)
GFR, EST AFRICAN AMERICAN: 21 mL/min — AB (ref >60)
GLUCOSE: 140 mg/dL — AB (ref 65–99)
Potassium: 4.9 mmol/L (ref 3.5–5.1)
SODIUM: 136 mmol/L (ref 135–145)
Total Bilirubin: 0.6 mg/dL (ref 0.3–1.2)
Total Protein: 6.3 g/dL — ABNORMAL LOW (ref 6.5–8.1)

## 2015-03-16 LAB — TROPONIN I
TROPONIN I: 0.04 ng/mL — AB (ref <0.031)
Troponin I: 0.05 ng/mL — ABNORMAL HIGH (ref <0.031)

## 2015-03-16 MED ORDER — FUROSEMIDE 10 MG/ML IJ SOLN
40.0000 mg | Freq: Every day | INTRAMUSCULAR | Status: DC
  Administered 2015-03-17: 40 mg via INTRAVENOUS
  Filled 2015-03-16: qty 4

## 2015-03-16 MED ORDER — DIPHENHYDRAMINE-ZINC ACETATE 2-0.1 % EX CREA
TOPICAL_CREAM | Freq: Two times a day (BID) | CUTANEOUS | Status: DC | PRN
  Filled 2015-03-16: qty 28

## 2015-03-16 MED ORDER — GUAIFENESIN 100 MG/5ML PO SOLN
5.0000 mL | Freq: Four times a day (QID) | ORAL | Status: DC | PRN
  Administered 2015-03-16 – 2015-03-19 (×3): 100 mg via ORAL
  Filled 2015-03-16 (×3): qty 10

## 2015-03-16 MED ORDER — ENOXAPARIN SODIUM 30 MG/0.3ML ~~LOC~~ SOLN
30.0000 mg | SUBCUTANEOUS | Status: DC
  Administered 2015-03-16: 30 mg via SUBCUTANEOUS
  Filled 2015-03-16: qty 0.3

## 2015-03-16 NOTE — Progress Notes (Signed)
NSR. 1 L of oxygen. Mod fall risk. Ambulated to BR and tolerated it well. Pt was educated on CHF. List of sitterswas provided to family. Pt reports no pain. Pt has no further concerns at this time.

## 2015-03-16 NOTE — Progress Notes (Signed)
Pt with DVT prophylaxis orders for enoxaparin 40mg  SQ q24hrs.  Renal function now worsened (CrCl <30 ml/min), qualifying patient for dose reduction to enoxaparin 30mg  SQ q24hrs. Orders adjusted.  Pharmacy will continue to monitor.  Estimated Creatinine Clearance: 28.7 mL/min (by C-G formula based on Cr of 2.7).   Roe Coombs, PharmD Pharmacy Resident 03/16/2015

## 2015-03-16 NOTE — Progress Notes (Signed)
[facility] [place] NAME: Jacqueline Rodriguez    MR#:  [id number]  DATE OF BIRTH:  01-28-1960  SUBJECTIVE:  CHIEF COMPLAINT:   Chief Complaint  Patient presents with  . Shortness of Breath   SOB better. Off Bipap. Still has cough.  No h/o CHF  REVIEW OF SYSTEMS:    Review of Systems  Constitutional: Positive for malaise/fatigue. Negative for fever and chills.  HENT: Negative for sore throat.   Eyes: Negative for blurred vision, double vision and pain.  Respiratory: Positive for cough and shortness of breath. Negative for hemoptysis and wheezing.   Cardiovascular: Positive for orthopnea and leg swelling. Negative for chest pain and palpitations.  Gastrointestinal: Negative for heartburn, nausea, vomiting, abdominal pain, diarrhea and constipation.  Genitourinary: Negative for dysuria and hematuria.  Musculoskeletal: Negative for back pain and joint pain.  Skin: Positive for itching and rash.  Neurological: Positive for weakness. Negative for sensory change, speech change, focal weakness and headaches.  Endo/Heme/Allergies: Does not bruise/bleed easily.  Psychiatric/Behavioral: Negative for depression. The patient is not nervous/anxious.     DRUG ALLERGIES:  No Known Allergies  VITALS:  Blood pressure 169/74, pulse 84, temperature 98.2 F (36.8 C), temperature source Oral, resp. rate 18, height 5\' 9"  (1.753 m), weight 98.8 kg (217 lb 13 oz), SpO2 95 %.  PHYSICAL EXAMINATION:   Physical Exam  GENERAL:  55 y.o.-year-old patient lying in the bed with no acute distress.obese EYES: Pupils equal, round, reactive to light and accommodation. No scleral icterus. Extraocular muscles intact.  HEENT: Head atraumatic, normocephalic. Oropharynx and nasopharynx clear.  NECK:  Supple, no jugular venous distention. No thyroid enlargement, no tenderness.  LUNGS-Increased work of breathing. Bilateral crackles CARDIOVASCULAR: S1, S2 normal. No  murmurs, rubs, or gallops.  ABDOMEN: Soft, nontender, nondistended. Bowel sounds present. No organomegaly or mass.  EXTREMITIES: No cyanosis, clubbing.  2+ lower extremity edema  NEUROLOGIC: Cranial nerves II through XII are intact. No focal Motor or sensory deficits b/l.   PSYCHIATRIC: The patient is alert and oriented x 3.  SKIN: No obvious rash, lesion, or ulcer.   LABORATORY PANEL:   CBC  Recent Labs Lab 03/16/15 0800  WBC 7.2  HGB 10.4*  HCT 31.9*  PLT 174   ------------------------------------------------------------------------------------------------------------------ Chemistries   Recent Labs Lab 03/16/15 0800  NA 136  K 4.9  CL 104  CO2 25  GLUCOSE 140*  BUN 29*  CREATININE 2.70*  CALCIUM 8.2*  AST 22  ALT 17  ALKPHOS 76  BILITOT 0.6   ------------------------------------------------------------------------------------------------------------------  Cardiac Enzymes  Recent Labs Lab 03/16/15 0800  TROPONINI 0.04*   ------------------------------------------------------------------------------------------------------------------  RADIOLOGY:  Dg Chest Portable 1 View  03/15/2015  CLINICAL DATA:  Worsening shortness of breath and hypoxia. EXAM: PORTABLE CHEST 1 VIEW COMPARISON:  01/07/2015 FINDINGS: Heart size remains at the upper limit of normal. Increased diffuse interstitial infiltrates are seen with atelectasis or consolidation in the right perihilar region. Increased bilateral pleural effusions are noted, left side greater than right. IMPRESSION: Findings consistent with increased pulmonary edema and bilateral pleural effusions, likely due to congestive heart failure. Increased right perihilar atelectasis versus superimposed pneumonia. Electronically Signed   By: Earle Gell M.D.   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 Also has diabetes risk factor. - IV Lasix, Beta blockers - Input  and Output - Counseled to limit fluids and Salt - Monitor Bun/Cr and Potassium - Echo -Cardiology follow up  * Acute renal failure over CKD stage III Due to cardiorenal syndrome and diuresis Reduce Lasix to once a day. Repeat labs in the morning. Consult nephrology for worsening.  * Accelerated hypertension Improving with diuresis. Lisinopril held due to renal failure. Metoprolol started. Continue Nitropaste  * Diabetes mellitus Sliding scale insulin and diabetic diet  * Mild anemia of chronic disease. Needs to be monitored  * DVT prophylaxis with heparin   All the records are reviewed and case discussed with Care Management/Social Workerr. Management plans discussed with the patient, family and they are in agreement.  CODE STATUS: FULL  DVT Prophylaxis: SCDs  TOTAL TIME TAKING CARE OF THIS PATIENT: 40 minutes.   POSSIBLE D/C IN 2-3 DAYS, DEPENDING ON CLINICAL CONDITION.  Hillary Bow R M.D on 03/16/2015 at 10:16 AM  Between 7am to 6pm - Pager - [phone]  After 6pm go to [email or web address] - password [email or web address] [facility]  Office  [phone]  CC: Primary care physician; Pcp Not In System  Note: This dictation was prepared with Dragon dictation along with smaller phrase technology. Any transcriptional errors that result from this process are unintentional.

## 2015-03-16 NOTE — Care Management Note (Signed)
Case Management Note  Patient Details  Name: Jacqueline Rodriguez MRN: [id number] Date of Birth: 30-Sep-1960  Subjective/Objective:    Jacqueline Rodriguez had expressed concern that there was no one to provide care for her Mother while she is in the hospital. This Probation officer provided her with a list of private pay sitters and aides. Jacqueline Rodriguez then stated that her husband was going to take time off work and stay with her Mother.                 Action/Plan:   Expected Discharge Date:  03/17/15               Expected Discharge Plan:     In-House Referral:     Discharge planning Services     Post Acute Care Choice:    Choice offered to:     DME Arranged:    DME Agency:     HH Arranged:    HH Agency:     Status of Service:     Medicare Important Message Given:    Date Medicare IM Given:    Medicare IM give by:    Date Additional Medicare IM Given:    Additional Medicare Important Message give by:     If discussed at [place] of Stay Meetings, dates discussed:    Additional Comments:  Simone Rodenbeck A, RN 03/16/2015, 2:30 PM

## 2015-03-16 NOTE — H&P (Signed)
Jacqueline Rodriguez is a 55 y.o. female  [id number]  Primary Cardiologist: Neoma Laming Reason for Consultation: CHF  HPI: 55 year old white female with a past medical history hypertension diabetes presented to the emergency room with respiratory distress and blood pressure systolic over [id number] and chest x-ray showing congestive heart failure. She also has renal insufficiency. She says that she's been short of breath for the past 6 weeks and gradually got worse past couple of days and was admitted to the ICU. At this time she is feeling much better.   Review of Systems: Positive orthopnea PND and leg swelling but no chest pain   Past Medical History  Diagnosis Date  . Hypertension   . Diabetes mellitus without complication ([place])     Medications Prior to Admission  Medication Sig Dispense Refill  . albuterol (PROVENTIL HFA;VENTOLIN HFA) 108 (90 Base) MCG/ACT inhaler Inhale 2 puffs into the lungs every 6 (six) hours as needed for wheezing or shortness of breath. 1 Inhaler 2  . gabapentin (NEURONTIN) 100 MG capsule Take 100 mg by mouth 3 (three) times daily as needed. For neuropathy.  2  . glipiZIDE-metformin (METAGLIP) 2.5-500 MG tablet Take 1 tablet by mouth daily with breakfast.  2  . lisinopril (PRINIVIL,ZESTRIL) 5 MG tablet Take 5 mg by mouth every morning.  2     . aspirin EC  81 mg Oral Daily  . cefTRIAXone (ROCEPHIN)  IV  1 g Intravenous Q24H  . docusate sodium  100 mg Oral BID  . enoxaparin (LOVENOX) injection  30 mg Subcutaneous Q24H  . [START ON 03/17/2015] furosemide  40 mg Intravenous Daily  . glipiZIDE  5 mg Oral QAC breakfast  . insulin aspart  0-9 Units Subcutaneous TID WC  . ipratropium-albuterol  3 mL Nebulization QID  . metoprolol tartrate  25 mg Oral BID  . nitroGLYCERIN  2 inch Topical 4 times per day  . pregabalin  100 mg Oral BID  . sodium chloride flush  3 mL Intravenous Q12H  . sodium chloride flush  3 mL Intravenous Q12H    Infusions:    No Known  Allergies  Social History   Social History  . Marital Status: Married    Spouse Name: N/A  . Number of Children: N/A  . Years of Education: N/A   Occupational History  . Not on file.   Social History Main Topics  . Smoking status: Former Research scientist (life sciences)  . Smokeless tobacco: Not on file  . Alcohol Use: No  . Drug Use: No  . Sexual Activity: Not on file   Other Topics Concern  . Not on file   Social History Narrative    Family History  Problem Relation Age of Onset  . Hypertension Mother   . Diabetes Mellitus II Mother   . CAD Father   . Hypertension Father     PHYSICAL EXAM: Filed Vitals:   03/16/15 1000 03/16/15 1032  BP: 158/81 143/67  Pulse: 82 80  Temp:  98.9 F (37.2 C)  Resp:       Intake/Output Summary (Last 24 hours) at 03/16/15 1104 Last data filed at 03/16/15 0906  Gross per 24 hour  Intake    243 ml  Output   1800 ml  Net  -1557 ml    General:  Well appearing. No respiratory difficulty HEENT: normal Neck: supple. no JVD. Carotids 2+ bilat; no bruits. No lymphadenopathy or thryomegaly appreciated. Cor: PMI nondisplaced. Regular rate & rhythm. No rubs, gallops or  murmurs. Lungs: clear Abdomen: soft, nontender, nondistended. No hepatosplenomegaly. No bruits or masses. Good bowel sounds. Extremities: no cyanosis, clubbing, rash, edema Neuro: alert & oriented x 3, cranial nerves grossly intact. moves all 4 extremities w/o difficulty. Affect pleasant.  ECG: Sinus tachycardia with nonspecific ST-T changes  Results for orders placed or performed during the hospital encounter of 03/15/15 (from the past 24 hour(s))  CBC     Status: Abnormal   Collection Time: 03/15/15  4:01 PM  Result Value Ref Range   WBC 9.9 3.6 - 11.0 K/uL   RBC 4.42 3.80 - 5.20 MIL/uL   Hemoglobin 11.6 (L) 12.0 - 16.0 g/dL   HCT 35.9 35.0 - 47.0 %   MCV 81.2 80.0 - 100.0 fL   MCH 26.3 26.0 - 34.0 pg   MCHC 32.4 32.0 - 36.0 g/dL   RDW 16.7 (H) 11.5 - 14.5 %   Platelets 179 150 -  440 K/uL  Comprehensive metabolic panel     Status: Abnormal   Collection Time: 03/15/15  4:01 PM  Result Value Ref Range   Sodium 137 135 - 145 mmol/L   Potassium 4.9 3.5 - 5.1 mmol/L   Chloride 105 101 - 111 mmol/L   CO2 25 22 - 32 mmol/L   Glucose, Bld 176 (H) 65 - 99 mg/dL   BUN 25 (H) 6 - 20 mg/dL   Creatinine, Ser 2.37 (H) 0.44 - 1.00 mg/dL   Calcium 8.7 (L) 8.9 - 10.3 mg/dL   Total Protein 7.1 6.5 - 8.1 g/dL   Albumin 3.2 (L) 3.5 - 5.0 g/dL   AST 25 15 - 41 U/L   ALT 19 14 - 54 U/L   Alkaline Phosphatase 85 38 - 126 U/L   Total Bilirubin 0.5 0.3 - 1.2 mg/dL   GFR calc non Af Amer 22 (L) >60 mL/min   GFR calc Af Amer 25 (L) >60 mL/min   Anion gap 7 5 - 15  Troponin I     Status: Abnormal   Collection Time: 03/15/15  4:01 PM  Result Value Ref Range   Troponin I 0.05 (H) <0.031 ng/mL  Brain natriuretic peptide     Status: Abnormal   Collection Time: 03/15/15  4:01 PM  Result Value Ref Range   B Natriuretic Peptide 415.0 (H) 0.0 - 100.0 pg/mL  MRSA PCR Screening     Status: None   Collection Time: 03/15/15  7:56 PM  Result Value Ref Range   MRSA by PCR NEGATIVE NEGATIVE  Troponin I     Status: Abnormal   Collection Time: 03/15/15  8:05 PM  Result Value Ref Range   Troponin I 0.05 (H) <0.031 ng/mL  Troponin I     Status: Abnormal   Collection Time: 03/16/15  2:17 AM  Result Value Ref Range   Troponin I 0.05 (H) <0.031 ng/mL  Glucose, capillary     Status: Abnormal   Collection Time: 03/16/15  7:39 AM  Result Value Ref Range   Glucose-Capillary 118 (H) 65 - 99 mg/dL  Troponin I     Status: Abnormal   Collection Time: 03/16/15  8:00 AM  Result Value Ref Range   Troponin I 0.04 (H) <0.031 ng/mL  Comprehensive metabolic panel     Status: Abnormal   Collection Time: 03/16/15  8:00 AM  Result Value Ref Range   Sodium 136 135 - 145 mmol/L   Potassium 4.9 3.5 - 5.1 mmol/L   Chloride 104 101 - 111 mmol/L  CO2 25 22 - 32 mmol/L   Glucose, Bld 140 (H) 65 - 99 mg/dL    BUN 29 (H) 6 - 20 mg/dL   Creatinine, Ser 2.70 (H) 0.44 - 1.00 mg/dL   Calcium 8.2 (L) 8.9 - 10.3 mg/dL   Total Protein 6.3 (L) 6.5 - 8.1 g/dL   Albumin 2.7 (L) 3.5 - 5.0 g/dL   AST 22 15 - 41 U/L   ALT 17 14 - 54 U/L   Alkaline Phosphatase 76 38 - 126 U/L   Total Bilirubin 0.6 0.3 - 1.2 mg/dL   GFR calc non Af Amer 18 (L) >60 mL/min   GFR calc Af Amer 21 (L) >60 mL/min   Anion gap 7 5 - 15  CBC     Status: Abnormal   Collection Time: 03/16/15  8:00 AM  Result Value Ref Range   WBC 7.2 3.6 - 11.0 K/uL   RBC 3.98 3.80 - 5.20 MIL/uL   Hemoglobin 10.4 (L) 12.0 - 16.0 g/dL   HCT 31.9 (L) 35.0 - 47.0 %   MCV 80.1 80.0 - 100.0 fL   MCH 26.1 26.0 - 34.0 pg   MCHC 32.5 32.0 - 36.0 g/dL   RDW 16.1 (H) 11.5 - 14.5 %   Platelets 174 150 - 440 K/uL   Dg Chest Portable 1 View  03/15/2015  CLINICAL DATA:  Worsening shortness of breath and hypoxia. EXAM: PORTABLE CHEST 1 VIEW COMPARISON:  01/07/2015 FINDINGS: Heart size remains at the upper limit of normal. Increased diffuse interstitial infiltrates are seen with atelectasis or consolidation in the right perihilar region. Increased bilateral pleural effusions are noted, left side greater than right. IMPRESSION: Findings consistent with increased pulmonary edema and bilateral pleural effusions, likely due to congestive heart failure. Increased right perihilar atelectasis versus superimposed pneumonia. Electronically Signed   By: Earle Gell M.D.   On: 03/15/2015 16:23     ASSESSMENT AND PLAN: Congestive heart failure with renal insufficiency and EKG changes suggestive of coronary artery disease along with mild elevation of the troponins. Continue IV Lasix and will get an echocardiogram to evaluate left ventricle ejection fraction fraction. Patient appears to be getting better and blood pressure is much better.  Nelwyn Hebdon A

## 2015-03-16 NOTE — Progress Notes (Signed)
Pt is awake and alert this am, but states she is tired as did not sleep well as up and down to [facility] frequently. O2 Sats 99% on 2 L, 02 turned down to 1L , sats 94 % while awake, Attempted to remove 02, but sats drop to 88% when she sleeps. Currently on 1 L , with sats 93%. Co mild headache this am, given tylenol with good effect, pt is currently sleeping. MD in to see and pt to be transfer to floor, report called to [facility] on floor.  Pt expressed some concern over elderly mother who lives with her and who she cares for, Social worker and Transport planner contacted and will provide a list of possible sitters for family home use. Also concern re 58 year old grandson, but pt states he can go and stay with other relatives

## 2015-03-17 LAB — GLUCOSE, CAPILLARY
GLUCOSE-CAPILLARY: 67 mg/dL (ref 65–99)
GLUCOSE-CAPILLARY: 92 mg/dL (ref 65–99)
Glucose-Capillary: 172 mg/dL — ABNORMAL HIGH (ref 65–99)
Glucose-Capillary: 63 mg/dL — ABNORMAL LOW (ref 65–99)
Glucose-Capillary: 66 mg/dL (ref 65–99)
Glucose-Capillary: 99 mg/dL (ref 65–99)
Glucose-Capillary: 117 mg/dL — ABNORMAL HIGH (ref 65–99)

## 2015-03-17 LAB — BASIC METABOLIC PANEL
ANION GAP: 10 (ref 5–15)
BUN: 35 mg/dL — ABNORMAL HIGH (ref 6–20)
CO2: 23 mmol/L (ref 22–32)
CREATININE: 2.84 mg/dL — AB (ref 0.44–1.00)
Calcium: 8.2 mg/dL — ABNORMAL LOW (ref 8.9–10.3)
Chloride: 103 mmol/L (ref 101–111)
GFR calc non Af Amer: 17 mL/min — ABNORMAL LOW (ref >60)
GFR, EST AFRICAN AMERICAN: 20 mL/min — AB (ref >60)
Glucose, Bld: 207 mg/dL — ABNORMAL HIGH (ref 65–99)
Potassium: 5.1 mmol/L (ref 3.5–5.1)
Sodium: 136 mmol/L (ref 135–145)

## 2015-03-17 MED ORDER — PREGABALIN 50 MG PO CAPS
75.0000 mg | ORAL_CAPSULE | Freq: Two times a day (BID) | ORAL | Status: DC
  Administered 2015-03-17 – 2015-03-20 (×6): 75 mg via ORAL
  Filled 2015-03-17 (×6): qty 1

## 2015-03-17 MED ORDER — FUROSEMIDE 20 MG PO TABS: 20.0000 mg | ORAL_TABLET | Freq: Two times a day (BID) | ORAL | Status: DC

## 2015-03-17 MED ORDER — FLUTICASONE PROPIONATE 50 MCG/ACT NA SUSP
2.0000 | Freq: Every day | NASAL | Status: DC
  Administered 2015-03-17 – 2015-03-20 (×4): 2 via NASAL
  Filled 2015-03-17: qty 16

## 2015-03-17 MED ORDER — HEPARIN SODIUM (PORCINE) 5000 UNIT/ML IJ SOLN
5000.0000 [IU] | Freq: Three times a day (TID) | INTRAMUSCULAR | Status: DC
  Administered 2015-03-17 – 2015-03-20 (×9): 5000 [IU] via SUBCUTANEOUS
  Filled 2015-03-17 (×9): qty 1

## 2015-03-17 MED ORDER — ZOLPIDEM TARTRATE 5 MG PO TABS
5.0000 mg | ORAL_TABLET | Freq: Every evening | ORAL | Status: DC | PRN
  Administered 2015-03-17: 5 mg via ORAL
  Filled 2015-03-17: qty 1

## 2015-03-17 MED ORDER — OXYCODONE-ACETAMINOPHEN 5-325 MG PO TABS
1.0000 | ORAL_TABLET | Freq: Four times a day (QID) | ORAL | Status: DC | PRN
  Administered 2015-03-19 – 2015-03-20 (×2): 1 via ORAL
  Filled 2015-03-17 (×2): qty 1

## 2015-03-17 NOTE — Care Management (Signed)
Patient originally admitted to icu due to the need from continuous bpapp and transferred to 2A within last 24 hours. Diagnosis of heart failure is new for this patient and her current 02 requirement is acute.  Discussed during progression the need to wean and or assess for home 02.  Referral to heart failure clinic.  There are no issues identified with obtaining meds.  Attending anticipates discharge within the next 24 hours.

## 2015-03-17 NOTE — Progress Notes (Signed)
SUBJECTIVE: Patient is less short of breath   Filed Vitals:   03/16/15 2050 03/17/15 0026 03/17/15 0424 03/17/15 0725  BP:  157/66 173/81   Pulse:  80 86   Temp:   97.8 F (36.6 C)   TempSrc:   Oral   Resp:   16   Height:      Weight:   228 lb 1.6 oz (103.465 kg)   SpO2: 96%  95% 94%    Intake/Output Summary (Last 24 hours) at 03/17/15 0856 Last data filed at 03/17/15 0441  Gross per 24 hour  Intake  24081 ml  Output   1000 ml  Net  23081 ml    LABS: Basic Metabolic Panel:  Recent Labs  03/16/15 0800 03/17/15 0547  NA 136 136  K 4.9 5.1  CL 104 103  CO2 25 23  GLUCOSE 140* 207*  BUN 29* 35*  CREATININE 2.70* 2.84*  CALCIUM 8.2* 8.2*   Liver Function Tests:  Recent Labs  03/15/15 1601 03/16/15 0800  AST 25 22  ALT 19 17  ALKPHOS 85 76  BILITOT 0.5 0.6  PROT 7.1 6.3*  ALBUMIN 3.2* 2.7*   No results for input(s): LIPASE, AMYLASE in the last 72 hours. CBC:  Recent Labs  03/15/15 1601 03/16/15 0800  WBC 9.9 7.2  HGB 11.6* 10.4*  HCT 35.9 31.9*  MCV 81.2 80.1  PLT 179 174   Cardiac Enzymes:  Recent Labs  03/15/15 2005 03/16/15 0217 03/16/15 0800  TROPONINI 0.05* 0.05* 0.04*   BNP: Invalid input(s): POCBNP D-Dimer: No results for input(s): DDIMER in the last 72 hours. Hemoglobin A1C: No results for input(s): HGBA1C in the last 72 hours. Fasting Lipid Panel: No results for input(s): CHOL, HDL, LDLCALC, TRIG, CHOLHDL, LDLDIRECT in the last 72 hours. Thyroid Function Tests: No results for input(s): TSH, T4TOTAL, T3FREE, THYROIDAB in the last 72 hours.  Invalid input(s): FREET3 Anemia Panel: No results for input(s): VITAMINB12, FOLATE, FERRITIN, TIBC, IRON, RETICCTPCT in the last 72 hours.   PHYSICAL EXAM General: Well developed, well nourished, in no acute distress HEENT:  Normocephalic and atramatic Neck:  No JVD.  Lungs: Clear bilaterally to auscultation and percussion. Heart: HRRR . Normal S1 and S2 without gallops or murmurs.   Abdomen: Bowel sounds are positive, abdomen soft and non-tender  Msk:  Back normal, normal gait. Normal strength and tone for age. Extremities: No clubbing, cyanosis or edema.   Neuro: Alert and oriented X 3. Psych:  Good affect, responds appropriately The top TELEMETRY: Monitor shows sinus rhythm  ASSESSMENT AND PLAN: Left ventricle ejection fraction on echocardiogram was 50% and patient presented with elevated BNP and CHF. But also has renal insufficiency. Patient appears to be getting better with no chest pain and shortness of breath. Will have to rule out coronary artery disease by doing outpatient workup. Patient can be discharged with follow-up in the office this Thursday at 2 PM  Principal Problem:   Acute respiratory failure ([place]) Active Problems:   CHF (congestive heart failure) ([place])   Malignant hypertension   Diabetes mellitus without complication ([place])   Congestive heart disease (HCC)    Honesti Seaberg A, MD, [facility] 03/17/2015 8:56 AM

## 2015-03-17 NOTE — Plan of Care (Signed)
Problem: Tissue Perfusion: Goal: Adequacy of tissue perfusion will improve Outcome: Progressing Pt denies any SOB with exertion or at rest.

## 2015-03-17 NOTE — Progress Notes (Addendum)
[place] at [place] NAME: Jacqueline Rodriguez    MR#:  [id number]  DATE OF BIRTH:  05/02/1960  SUBJECTIVE:  CHIEF COMPLAINT:   Chief Complaint  Patient presents with  . Shortness of Breath   Better SOB and cough. On O2 [place] 2L. Off Bipap.  REVIEW OF SYSTEMS:    Review of Systems  Constitutional: Positive for malaise/fatigue. Negative for fever and chills.  HENT: Negative for sore throat.   Eyes: Negative for blurred vision, double vision and pain.  Respiratory: Positive for cough and shortness of breath. Negative for hemoptysis and wheezing.   Cardiovascular: Positive for orthopnea and leg swelling. Negative for chest pain and palpitations.  Gastrointestinal: Negative for heartburn, nausea, vomiting, abdominal pain, diarrhea and constipation.  Genitourinary: Negative for dysuria and hematuria.  Musculoskeletal: Negative for back pain and joint pain.  Skin: Positive for itching and rash.  Neurological: Positive for weakness. Negative for sensory change, speech change, focal weakness and headaches.  Endo/Heme/Allergies: Does not bruise/bleed easily.  Psychiatric/Behavioral: Negative for depression. The patient is not nervous/anxious.     DRUG ALLERGIES:  No Known Allergies  VITALS:  Blood pressure 131/68, pulse 69, temperature 97.4 F (36.3 C), temperature source Oral, resp. rate 16, height 5\' 9"  (1.753 m), weight 103.465 kg (228 lb 1.6 oz), SpO2 98 %.  PHYSICAL EXAMINATION:   Physical Exam  GENERAL:  55 y.o.-year-old patient lying in the bed with no acute distress.obese EYES: Pupils equal, round, reactive to light and accommodation. No scleral icterus. Extraocular muscles intact.  HEENT: Head atraumatic, normocephalic. Oropharynx and nasopharynx clear.  NECK:  Supple, no jugular venous distention. No thyroid enlargement, no tenderness.  LUNGS- Normal breath sound bilaterally. No wheezing but has Bilateral rales. No use of accessory  muscles to breath. CARDIOVASCULAR: S1, S2 normal. No murmurs, rubs, or gallops.  ABDOMEN: Soft, nontender, nondistended. Bowel sounds present. No organomegaly or mass.  EXTREMITIES: No cyanosis, clubbing.  1+ lower extremity edema  NEUROLOGIC: Cranial nerves II through XII are intact. No focal Motor or sensory deficits b/l.   PSYCHIATRIC: The patient is alert and oriented x 3.  SKIN: No obvious rash, lesion, or ulcer.   LABORATORY PANEL:   CBC  Recent Labs Lab 03/16/15 0800  WBC 7.2  HGB 10.4*  HCT 31.9*  PLT 174   ------------------------------------------------------------------------------------------------------------------ Chemistries   Recent Labs Lab 03/16/15 0800 03/17/15 0547  NA 136 136  K 4.9 5.1  CL 104 103  CO2 25 23  GLUCOSE 140* 207*  BUN 29* 35*  CREATININE 2.70* 2.84*  CALCIUM 8.2* 8.2*  AST 22  --   ALT 17  --   ALKPHOS 76  --   BILITOT 0.6  --    ------------------------------------------------------------------------------------------------------------------  Cardiac Enzymes  Recent Labs Lab 03/16/15 0800  TROPONINI 0.04*   ------------------------------------------------------------------------------------------------------------------  RADIOLOGY:  Dg Chest Portable 1 View  03/15/2015  CLINICAL DATA:  Worsening shortness of breath and hypoxia. EXAM: PORTABLE CHEST 1 VIEW COMPARISON:  01/07/2015 FINDINGS: Heart size remains at the upper limit of normal. Increased diffuse interstitial infiltrates are seen with atelectasis or consolidation in the right perihilar region. Increased bilateral pleural effusions are noted, left side greater than right. IMPRESSION: Findings consistent with increased pulmonary edema and bilateral pleural effusions, likely due to congestive heart failure. Increased right perihilar atelectasis versus superimposed pneumonia. Electronically Signed   By: Earle Gell M.D.   On: 03/15/2015 16:23     ASSESSMENT AND PLAN:    *  Acute diastolic congestive heart failure.  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 Also has diabetes risk factor. on Lasix, Beta blockers - Echo: EF 50%. Per Dr. Humphrey Rolls, have to rule out coronary artery disease by doing outpatient workup.   * Acute respiratory failure with hypoxia. Off BiPAP. Try to wean off oxygen and it cannula. Continue NEB when necessary.  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 Due to cardiorenal syndrome and diuresis Reduced Lasix to once a day. Repeat BMP in the morning.  * Accelerated hypertension. Better controlled. Lisinopril held due to renal failure. Metoprolol started. Continue Nitropaste  * Diabetes mellitus. Controlled. Sliding scale insulin and diabetic diet. Hold glipizide-metformin due to ARF on CKD.  * Mild anemia of chronic disease. Stable.  * DVT prophylaxis with heparin   All the records are reviewed and case discussed with Care Management/Social Workerr. Management plans discussed with the patient, family and they are in agreement. Greater than 50% time was spent on coordination of care and face-to-face counseling. CODE STATUS: FULL  DVT Prophylaxis: SCDs  TOTAL TIME TAKING CARE OF THIS PATIENT: 41 minutes.   POSSIBLE D/C IN 2-3 DAYS, DEPENDING ON CLINICAL CONDITION.  Demetrios Loll M.D on 03/17/2015 at 2:52 PM  Between 7am to 6pm - Pager - [phone]  After 6pm go to [email or web address] - password [email or web address] [facility]  Office  [phone]  CC: Primary care physician; Pcp Not In System  Note: This dictation was prepared with Dragon dictation along with smaller phrase technology. Any transcriptional errors that result from this process are unintentional.

## 2015-03-18 LAB — BASIC METABOLIC PANEL
ANION GAP: 8 (ref 5–15)
BUN: 42 mg/dL — ABNORMAL HIGH (ref 6–20)
CHLORIDE: 107 mmol/L (ref 101–111)
CO2: 24 mmol/L (ref 22–32)
CREATININE: 2.92 mg/dL — AB (ref 0.44–1.00)
Calcium: 7.8 mg/dL — ABNORMAL LOW (ref 8.9–10.3)
GFR calc non Af Amer: 17 mL/min — ABNORMAL LOW (ref >60)
GFR, EST AFRICAN AMERICAN: 19 mL/min — AB (ref >60)
Glucose, Bld: 135 mg/dL — ABNORMAL HIGH (ref 65–99)
Potassium: 4.8 mmol/L (ref 3.5–5.1)
SODIUM: 139 mmol/L (ref 135–145)

## 2015-03-18 LAB — GLUCOSE, CAPILLARY
GLUCOSE-CAPILLARY: 173 mg/dL — AB (ref 65–99)
GLUCOSE-CAPILLARY: 136 mg/dL — AB (ref 65–99)
Glucose-Capillary: 144 mg/dL — ABNORMAL HIGH (ref 65–99)
Glucose-Capillary: 121 mg/dL — ABNORMAL HIGH (ref 65–99)

## 2015-03-18 LAB — PROTEIN / CREATININE RATIO, URINE
CREATININE, URINE: 102 mg/dL
PROTEIN CREATININE RATIO: 11.84 mg/mg{creat} — AB (ref 0.00–0.15)
TOTAL PROTEIN, URINE: 1208 mg/dL

## 2015-03-18 LAB — MAGNESIUM: MAGNESIUM: 2.2 mg/dL (ref 1.7–2.4)

## 2015-03-18 LAB — PHOSPHORUS: Phosphorus: 4.2 mg/dL (ref 2.5–4.6)

## 2015-03-18 LAB — HEMOGLOBIN A1C: Hgb A1c MFr Bld: 8.2 % — ABNORMAL HIGH (ref 4.0–6.0)

## 2015-03-18 MED ORDER — DIPHENHYDRAMINE HCL 25 MG PO CAPS
25.0000 mg | ORAL_CAPSULE | Freq: Every evening | ORAL | Status: DC | PRN
  Administered 2015-03-18: 25 mg via ORAL
  Filled 2015-03-18: qty 1

## 2015-03-18 MED ORDER — FUROSEMIDE 20 MG PO TABS
20.0000 mg | ORAL_TABLET | Freq: Every day | ORAL | Status: DC
  Administered 2015-03-18 – 2015-03-20 (×3): 20 mg via ORAL
  Filled 2015-03-18 (×3): qty 1

## 2015-03-18 NOTE — Progress Notes (Signed)
Inpatient Diabetes Program Recommendations  AACE/ADA: New Consensus Statement on Inpatient Glycemic Control (2015)  Target Ranges:  Prepandial:   less than 140 mg/dL      Peak postprandial:   less than 180 mg/dL (1-2 hours)      Critically ill patients:  140 - 180 mg/dL   Review of Glycemic Control  Results for Jacqueline Rodriguez, Jacqueline Rodriguez (MRN [id number]) as of 03/18/2015 13:57  Ref. Range 03/17/2015 16:40 03/17/2015 17:36 03/17/2015 21:18 03/18/2015 07:35 03/18/2015 11:39  Glucose-Capillary Latest Ref Range: 65-99 mg/dL 66 99 117 (H) 173 (H) 144 (H)    Diabetes history: Type 2 diabetes Outpatient Diabetes medications: Glipizide/Metfromin 2.5/500mg  qday Current orders for Inpatient glycemic control: Novolog 0-9 units tid with meals  Inpatient Diabetes Program Recommendations:  Agree with current orders for diabetes management.  Gentry Fitz, RN, BA, MHA, CDE Diabetes Coordinator Inpatient Diabetes Program  [phone] (Team Pager) [phone] ([place]) 03/18/2015 1:59 PM

## 2015-03-18 NOTE — Progress Notes (Signed)
Pt requesting something to help her sleep. Pt has Ambien ordered, however per nursing staff and pt ambien caused pt to become confused. Pt seeking alternative option. MD Jannifer Franklin notified. MD to place orders. RN will administer and continue to monitor.   Iran Sizer M

## 2015-03-18 NOTE — Progress Notes (Signed)
SUBJECTIVE: Patient is feeling much better no longer feeling short of breath   Filed Vitals:   03/17/15 1948 03/17/15 2039 03/18/15 0500 03/18/15 0834  BP: 173/78   165/83  Pulse: 88   87  Temp:    99.5 F (37.5 C)  TempSrc:    Oral  Resp: 18   18  Height:      Weight:   226 lb 11.5 oz (102.84 kg)   SpO2: 96% 95%  97%    Intake/Output Summary (Last 24 hours) at 03/18/15 0934 Last data filed at 03/18/15 0738  Gross per 24 hour  Intake    480 ml  Output   2150 ml  Net  -1670 ml    LABS: Basic Metabolic Panel:  Recent Labs  03/17/15 0547 03/18/15 0448  NA 136 139  K 5.1 4.8  CL 103 107  CO2 23 24  GLUCOSE 207* 135*  BUN 35* 42*  CREATININE 2.84* 2.92*  CALCIUM 8.2* 7.8*  MG  --  2.2   Liver Function Tests:  Recent Labs  03/15/15 1601 03/16/15 0800  AST 25 22  ALT 19 17  ALKPHOS 85 76  BILITOT 0.5 0.6  PROT 7.1 6.3*  ALBUMIN 3.2* 2.7*   No results for input(s): LIPASE, AMYLASE in the last 72 hours. CBC:  Recent Labs  03/15/15 1601 03/16/15 0800  WBC 9.9 7.2  HGB 11.6* 10.4*  HCT 35.9 31.9*  MCV 81.2 80.1  PLT 179 174   Cardiac Enzymes:  Recent Labs  03/15/15 2005 03/16/15 0217 03/16/15 0800  TROPONINI 0.05* 0.05* 0.04*   BNP: Invalid input(s): POCBNP D-Dimer: No results for input(s): DDIMER in the last 72 hours. Hemoglobin A1C: No results for input(s): HGBA1C in the last 72 hours. Fasting Lipid Panel: No results for input(s): CHOL, HDL, LDLCALC, TRIG, CHOLHDL, LDLDIRECT in the last 72 hours. Thyroid Function Tests: No results for input(s): TSH, T4TOTAL, T3FREE, THYROIDAB in the last 72 hours.  Invalid input(s): FREET3 Anemia Panel: No results for input(s): VITAMINB12, FOLATE, FERRITIN, TIBC, IRON, RETICCTPCT in the last 72 hours.   PHYSICAL EXAM General: Well developed, well nourished, in no acute distress HEENT:  Normocephalic and atramatic Neck:  No JVD.  Lungs: Clear bilaterally to auscultation and percussion. Heart: HRRR  . Normal S1 and S2 without gallops or murmurs.  Abdomen: Bowel sounds are positive, abdomen soft and non-tender  Msk:  Back normal, normal gait. Normal strength and tone for age. Extremities: No clubbing, cyanosis or edema.   Neuro: Alert and oriented X 3. Psych:  Good affect, responds appropriately  TELEMETRY: Sinus rhythm  ASSESSMENT AND PLAN: CHF with left ventricle ejection fraction [id number] and diastolic dysfunction along with renal insufficiency. Patient probably needs outpatient cardiac workup and can be discharged today with follow-up in the office next week. Patient was given appointment.  Principal Problem:   Acute respiratory failure (HCC) Active Problems:   CHF (congestive heart failure) (HCC)   Malignant hypertension   Diabetes mellitus without complication (HCC)   Congestive heart disease (HCC)    Aritha Huckeba A, MD, [facility] 03/18/2015 9:34 AM

## 2015-03-18 NOTE — Consult Note (Signed)
[facility]  CONSULT NOTE    Date: 03/18/2015                  Patient Name:  Jacqueline Rodriguez  MRN: [id number]  DOB: [birth year]  Age / Sex: 55 y.o., female         PCP: Pcp Not In System                 Service Requesting Consult: Dr. Bridgett Larsson                 Reason for Consult: Acute renal Failure            History of Present Illness: Jacqueline Rodriguez is a 55 y.o. white  female with diastolic congestive heart failure, hypertension, diabetes mellitus type II, who was admitted to [facility] on 03/15/2015 for Acute pulmonary edema ([place]) [J81.0] Acute congestive heart failure, unspecified congestive heart failure type [facility]) [I50.9]  Nephrology consulted for creatinine of 2.92. Baseline creatinine of 1.94, eGFR of 28 on 01/07/15. Admitted with shortness of breath. Started on BiPAP and given furosemide. Now on PO furosemide and close euvolemic.   Given zolpidem last night and now patient is very drowsy.     Medications: Outpatient medications: Prescriptions prior to admission  Medication Sig Dispense Refill Last Dose  . albuterol (PROVENTIL HFA;VENTOLIN HFA) 108 (90 Base) MCG/ACT inhaler Inhale 2 puffs into the lungs every 6 (six) hours as needed for wheezing or shortness of breath. 1 Inhaler 2 Past Month at Unknown time  . gabapentin (NEURONTIN) 100 MG capsule Take 100 mg by mouth 3 (three) times daily as needed. For neuropathy.  2 03/15/2015 at Unknown time  . glipiZIDE-metformin (METAGLIP) 2.5-500 MG tablet Take 1 tablet by mouth daily with breakfast.  2 03/15/2015 at Unknown time  . lisinopril (PRINIVIL,ZESTRIL) 5 MG tablet Take 5 mg by mouth every morning.  2 03/15/2015 at Unknown time    Current medications: Current Facility-Administered Medications  Medication Dose Route Frequency Provider Last Rate Last Dose  . 0.9 %  sodium chloride infusion  250 mL Intravenous PRN Idelle Crouch, MD      . acetaminophen (TYLENOL) tablet 650 mg  650 mg Oral Q6H PRN Idelle Crouch,  MD   650 mg at 03/17/15 2145   Or  . acetaminophen (TYLENOL) suppository 650 mg  650 mg Rectal Q6H PRN Idelle Crouch, MD      . albuterol (PROVENTIL) (2.5 MG/3ML) 0.083% nebulizer solution 2.5 mg  2.5 mg Nebulization Q6H PRN Charlett Nose, RPH      . aspirin EC tablet 81 mg  81 mg Oral Daily Idelle Crouch, MD   81 mg at 03/18/15 0836  . bisacodyl (DULCOLAX) suppository 10 mg  10 mg Rectal Daily PRN Idelle Crouch, MD      . diphenhydrAMINE-zinc acetate (BENADRYL) 2-0.1 % cream   Topical BID PRN Srikar Sudini, MD      . docusate sodium (COLACE) capsule 100 mg  100 mg Oral BID Idelle Crouch, MD   100 mg at 03/18/15 0837  . fluticasone (FLONASE) 50 MCG/ACT nasal spray 2 spray  2 spray Each Nare Daily Demetrios Loll, MD   2 spray at 03/18/15 [phone]  . furosemide (LASIX) tablet 20 mg  20 mg Oral Daily Demetrios Loll, MD   20 mg at 03/18/15 0837  . guaiFENesin (ROBITUSSIN) 100 MG/5ML solution 100 mg  5 mL Oral Q6H PRN Hillary Bow, MD  100 mg at 03/16/15 0907  . heparin injection 5,000 Units  5,000 Units Subcutaneous 3 times per day Demetrios Loll, MD   5,000 Units at 03/18/15 0529  . insulin aspart (novoLOG) injection 0-9 Units  0-9 Units Subcutaneous TID WC Idelle Crouch, MD   2 Units at 03/18/15 [phone]  . ipratropium-albuterol (DUONEB) 0.5-2.5 (3) MG/3ML nebulizer solution 3 mL  3 mL Nebulization QID Idelle Crouch, MD   3 mL at 03/18/15 0808  . labetalol (NORMODYNE,TRANDATE) injection 10 mg  10 mg Intravenous Q2H PRN Idelle Crouch, MD      . metoprolol tartrate (LOPRESSOR) tablet 25 mg  25 mg Oral BID Idelle Crouch, MD   25 mg at 03/18/15 0837  . morphine 2 MG/ML injection 2 mg  2 mg Intravenous Q2H PRN Idelle Crouch, MD      . nitroGLYCERIN (NITROGLYN) 2 % ointment 2 inch  2 inch Topical 4 times per day Idelle Crouch, MD   2 inch at 03/18/15 0529  . ondansetron (ZOFRAN) tablet 4 mg  4 mg Oral Q6H PRN Idelle Crouch, MD       Or  . ondansetron [facility]) injection 4 mg  4 mg  Intravenous Q6H PRN Idelle Crouch, MD      . oxyCODONE-acetaminophen (PERCOCET/ROXICET) 5-325 MG per tablet 1 tablet  1 tablet Oral Q6H PRN Demetrios Loll, MD      . pregabalin (LYRICA) capsule 75 mg  75 mg Oral BID Demetrios Loll, MD   75 mg at 03/18/15 0837  . sodium chloride flush (NS) 0.9 % injection 3 mL  3 mL Intravenous Q12H Idelle Crouch, MD   3 mL at 03/18/15 7416  . sodium chloride flush (NS) 0.9 % injection 3 mL  3 mL Intravenous PRN Idelle Crouch, MD      . zolpidem [facility]) tablet 5 mg  5 mg Oral QHS PRN Vaughan Basta, MD   5 mg at 03/17/15 2223      Allergies: No Known Allergies    Past Medical History: Past Medical History  Diagnosis Date  . Hypertension   . Diabetes mellitus without complication [facility])      Past Surgical History: History reviewed. No pertinent past surgical history.   Family History: Family History  Problem Relation Age of Onset  . Hypertension Mother   . Diabetes Mellitus II Mother   . CAD Father   . Hypertension Father      Social History: Social History   Social History  . Marital Status: Married    Spouse Name: N/A  . Number of Children: N/A  . Years of Education: N/A   Occupational History  . Not on file.   Social History Main Topics  . Smoking status: Former Research scientist (life sciences)  . Smokeless tobacco: Not on file  . Alcohol Use: No  . Drug Use: No  . Sexual Activity: Not on file   Other Topics Concern  . Not on file   Social History Narrative     Review of Systems: Review of Systems  Constitutional: Negative.  Negative for fever, chills, weight loss, malaise/fatigue and diaphoresis.  HENT: Negative.  Negative for congestion, ear discharge, ear pain, hearing loss, nosebleeds, sore throat and tinnitus.   Eyes: Negative.  Negative for blurred vision, double vision, photophobia, pain, discharge and redness.  Respiratory: Positive for shortness of breath. Negative for cough, hemoptysis, sputum production, wheezing and stridor.    Cardiovascular: Positive for leg swelling. Negative for  palpitations, orthopnea, claudication and PND.  Gastrointestinal: Negative.  Negative for heartburn, nausea, vomiting, abdominal pain, diarrhea, constipation, blood in stool and melena.  Genitourinary: Negative.  Negative for dysuria, urgency, frequency, hematuria and flank pain.  Musculoskeletal: Negative.  Negative for myalgias, back pain, joint pain, falls and neck pain.  Skin: Negative.  Negative for itching and rash.  Neurological: Negative.  Negative for dizziness, tingling, tremors, sensory change, speech change, focal weakness, seizures, loss of consciousness, weakness and headaches.  Endo/Heme/Allergies: Negative.  Negative for environmental allergies and polydipsia. Does not bruise/bleed easily.  Psychiatric/Behavioral: Negative for depression, suicidal ideas, hallucinations, memory loss and substance abuse. The patient has insomnia. The patient is not nervous/anxious.     Vital Signs: Blood pressure 165/83, pulse 87, temperature 99.5 F (37.5 C), temperature source Oral, resp. rate 18, height '5\' 9"'  (1.753 m), weight 102.84 kg (226 lb 11.5 oz), SpO2 97 %.  Weight trends: Filed Weights   03/15/15 1951 03/17/15 0424 03/18/15 0500  Weight: 98.8 kg (217 lb 13 oz) 103.465 kg (228 lb 1.6 oz) 102.84 kg (226 lb 11.5 oz)    Physical Exam: General: NAD, laying in bed  Head: Normocephalic, atraumatic. Moist oral mucosal membranes  Eyes: Anicteric, PERRL  Neck: Supple, trachea midline  Lungs:  Clear to auscultation  Heart: Regular rate and rhythm  Abdomen:  Soft, nontender,   Extremities: trace peripheral edema.  Neurologic: Nonfocal, moving all four extremities  Skin: No lesions        Lab results: Basic Metabolic Panel:  Recent Labs Lab 03/16/15 0800 03/17/15 0547 03/18/15 0448  NA 136 136 139  K 4.9 5.1 4.8  CL 104 103 107  CO2 '25 23 24  ' GLUCOSE 140* 207* 135*  BUN 29* 35* 42*  CREATININE 2.70* 2.84* 2.92*   CALCIUM 8.2* 8.2* 7.8*  MG  --   --  2.2    Liver Function Tests:  Recent Labs Lab 03/15/15 1601 03/16/15 0800  AST 25 22  ALT 19 17  ALKPHOS 85 76  BILITOT 0.5 0.6  PROT 7.1 6.3*  ALBUMIN 3.2* 2.7*   No results for input(s): LIPASE, AMYLASE in the last 168 hours. No results for input(s): AMMONIA in the last 168 hours.  CBC:  Recent Labs Lab 03/15/15 1601 03/16/15 0800  WBC 9.9 7.2  HGB 11.6* 10.4*  HCT 35.9 31.9*  MCV 81.2 80.1  PLT 179 174    Cardiac Enzymes:  Recent Labs Lab 03/15/15 1601 03/15/15 2005 03/16/15 0217 03/16/15 0800  TROPONINI 0.05* 0.05* 0.05* 0.04*    BNP: Invalid input(s): POCBNP  CBG:  Recent Labs Lab 03/17/15 1607 03/17/15 1640 03/17/15 1736 03/17/15 2118 03/18/15 0735  GLUCAP 63* 66 99 117* 173*    Microbiology: Results for orders placed or performed during the hospital encounter of 03/15/15  MRSA PCR Screening     Status: None   Collection Time: 03/15/15  7:56 PM  Result Value Ref Range Status   MRSA by PCR NEGATIVE NEGATIVE Final    Comment:        The GeneXpert MRSA Assay (FDA approved for NASAL specimens only), is one component of a comprehensive MRSA colonization surveillance program. It is not intended to diagnose MRSA infection nor to guide or monitor treatment for MRSA infections.     Coagulation Studies: No results for input(s): LABPROT, INR in the last 72 hours.  Urinalysis: No results for input(s): COLORURINE, LABSPEC, PHURINE, GLUCOSEU, HGBUR, BILIRUBINUR, KETONESUR, PROTEINUR, UROBILINOGEN, NITRITE, LEUKOCYTESUR in the last 72 hours.  Invalid input(s): APPERANCEUR    Imaging:  No results found.   Assessment & Plan: Ms. DELAYZA LUNGREN is a 55 y.o. white  female with diastolic congestive heart failure, hypertension, diabetes mellitus type II, who was admitted to [facility] on 03/15/2015  1. Acute renal failure on chronic kidney disease stage IV: baseline creatinine of 1.94, eGFR of 28 on 01/07/15.  Patient's acute renal failure seems to be due to overdiuresis. However continues to have some peripheral edema.  Chronic kidney disease with proteinuria and glucosuria: secondary to hypertension and diabetes.  - Agree with lower dose of furosemide. Monitor volume status and renal function. No acute indication for dialysis. Renally dose all medications.  - Renal ultrasound.   2. Hypertension: with acute exacerbation of diastolic congestive heart failure: blood pressure at goal. Pulmonary and peripheral edema improved.  - furosemide 24m daily. Continue metoprolol. Takes lisinopril at home.   3. Anemia of chronic kidney disease: hemoglobin of 10.4  4. Diabetes mellitus type II with chronic kidney disease:  - continue glucose control.  - Check hemoglobin A1c.    LOS: 3[place]3/7/201711:31 AM

## 2015-03-18 NOTE — Progress Notes (Signed)
[facility] [place] NAME: Jacqueline Rodriguez    MR#:  [id number]  DATE OF BIRTH:  07/19/1960  SUBJECTIVE:  CHIEF COMPLAINT:   Chief Complaint  Patient presents with  . Shortness of Breath   No SOB and cough. Off O2 [place] 2L. Off Bipap.  REVIEW OF SYSTEMS:    Review of Systems  Constitutional: Positive for malaise/fatigue. Negative for fever and chills.  HENT: Negative for sore throat.   Eyes: Negative for blurred vision, double vision and pain.  Respiratory: No cough or shortness of breath. Negative for hemoptysis and wheezing.   Cardiovascular: No orthopnea but has leg swelling. Negative for chest pain and palpitations.  Gastrointestinal: Negative for heartburn, nausea, vomiting, abdominal pain, diarrhea and constipation.  Genitourinary: Negative for dysuria and hematuria.  Musculoskeletal: Negative for back pain and joint pain.  Skin: No itching and rash.  Neurological: Positive for weakness. Negative for sensory change, speech change, focal weakness and headaches.  Endo/Heme/Allergies: Does not bruise/bleed easily.  Psychiatric/Behavioral: Negative for depression. The patient is not nervous/anxious.     DRUG ALLERGIES:  No Known Allergies  VITALS:  Blood pressure 138/57, pulse 70, temperature 97.5 F (36.4 C), temperature source Oral, resp. rate 18, height 5\' 9"  (1.753 m), weight 102.84 kg (226 lb 11.5 oz), SpO2 94 %.  PHYSICAL EXAMINATION:   Physical Exam  GENERAL:  55 y.o.-year-old patient lying in the bed with no acute distress.obese EYES: Pupils equal, round, reactive to light and accommodation. No scleral icterus. Extraocular muscles intact.  HEENT: Head atraumatic, normocephalic. Oropharynx and nasopharynx clear.  NECK:  Supple, no jugular venous distention. No thyroid enlargement, no tenderness.  LUNGS- Normal breath sound bilaterally. No wheezing or rales. No use of accessory muscles to breath. CARDIOVASCULAR: S1, S2  normal. No murmurs, rubs, or gallops.  ABDOMEN: Soft, nontender, nondistended. Bowel sounds present. No organomegaly or mass.  EXTREMITIES: No cyanosis, clubbing.  1+ lower extremity edema  NEUROLOGIC: Cranial nerves II through XII are intact. No focal Motor or sensory deficits b/l.   PSYCHIATRIC: The patient is alert and oriented x 3.  SKIN: No obvious rash, lesion, or ulcer.   LABORATORY PANEL:   CBC  Recent Labs Lab 03/16/15 0800  WBC 7.2  HGB 10.4*  HCT 31.9*  PLT 174   ------------------------------------------------------------------------------------------------------------------ Chemistries   Recent Labs Lab 03/16/15 0800  03/18/15 0448  NA 136  < > 139  K 4.9  < > 4.8  CL 104  < > 107  CO2 25  < > 24  GLUCOSE 140*  < > 135*  BUN 29*  < > 42*  CREATININE 2.70*  < > 2.92*  CALCIUM 8.2*  < > 7.8*  MG  --   --  2.2  AST 22  --   --   ALT 17  --   --   ALKPHOS 76  --   --   BILITOT 0.6  --   --   < > = values in this interval not displayed. ------------------------------------------------------------------------------------------------------------------  Cardiac Enzymes  Recent Labs Lab 03/16/15 0800  TROPONINI 0.04*   ------------------------------------------------------------------------------------------------------------------  RADIOLOGY:  No results found.   ASSESSMENT AND PLAN:   * Acute diastolic congestive heart failure.  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 Also has diabetes risk factor. on Lasix, Beta blockers - Echo: EF 50%. Per Dr. Humphrey Rolls, have to rule out coronary artery disease by doing outpatient workup.   * Acute respiratory  failure with hypoxia. Off BiPAP. Weaned off oxygen via cannula. Continue NEB when necessary.  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 Due to cardiorenal syndrome and diuresis Reduced Lasix to 20 mg once a day. Renal US per Dr. Juleen China. Repeat BMP in the  morning.  * Accelerated hypertension. Better controlled. Lisinopril held due to renal failure. Metoprolol started. Continue Nitropaste  * Diabetes mellitus. Controlled. Sliding scale insulin and diabetic diet. Hold glipizide-metformin due to ARF on CKD.  * Mild anemia of chronic disease. Stable.  * DVT prophylaxis with heparin  I discussed with Dr. Juleen China, who agrees the current plan. All the records are reviewed and case discussed with Care Management/Social Workerr. Management plans discussed with the patient, family and they are in agreement. Greater than 50% time was spent on coordination of care and face-to-face counseling. CODE STATUS: FULL  DVT Prophylaxis: SCDs  TOTAL TIME TAKING CARE OF THIS PATIENT: 37 minutes.   POSSIBLE D/C IN 2 DAYS, DEPENDING ON CLINICAL CONDITION.  Demetrios Loll M.D on 03/18/2015 at 2:15 PM  Between 7am to 6pm - Pager - [phone]  After 6pm go to [email or web address] - password [email or web address] [facility]  Office  [phone]  CC: Primary care physician; Pcp Not In System  Note: This dictation was prepared with Dragon dictation along with smaller phrase technology. Any transcriptional errors that result from this process are unintentional.

## 2015-03-18 NOTE — Progress Notes (Signed)
Pt received medication to aide with sleeping. Pt became confused around 12am. Bed alarm set. Patient reoriented. Will continue to monitor.

## 2015-03-19 ENCOUNTER — Inpatient Hospital Stay: Payer: BLUE CROSS/BLUE SHIELD

## 2015-03-19 LAB — PARATHYROID HORMONE, INTACT (NO CA): PTH: 138 pg/mL — AB (ref 15–65)

## 2015-03-19 LAB — GLUCOSE, CAPILLARY
GLUCOSE-CAPILLARY: 141 mg/dL — AB (ref 65–99)
GLUCOSE-CAPILLARY: 165 mg/dL — AB (ref 65–99)
GLUCOSE-CAPILLARY: 187 mg/dL — AB (ref 65–99)
Glucose-Capillary: 268 mg/dL — ABNORMAL HIGH (ref 65–99)
Glucose-Capillary: 125 mg/dL — ABNORMAL HIGH (ref 65–99)

## 2015-03-19 LAB — BASIC METABOLIC PANEL
ANION GAP: 7 (ref 5–15)
BUN: 38 mg/dL — AB (ref 6–20)
CALCIUM: 7.9 mg/dL — AB (ref 8.9–10.3)
CO2: 25 mmol/L (ref 22–32)
Chloride: 103 mmol/L (ref 101–111)
Creatinine, Ser: 2.63 mg/dL — ABNORMAL HIGH (ref 0.44–1.00)
GFR calc Af Amer: 22 mL/min — ABNORMAL LOW (ref >60)
GFR, EST NON AFRICAN AMERICAN: 19 mL/min — AB (ref >60)
GLUCOSE: 164 mg/dL — AB (ref 65–99)
POTASSIUM: 4.6 mmol/L (ref 3.5–5.1)
SODIUM: 135 mmol/L (ref 135–145)

## 2015-03-19 MED ORDER — GUAIFENESIN ER 600 MG PO TB12
600.0000 mg | ORAL_TABLET | Freq: Two times a day (BID) | ORAL | Status: DC
  Administered 2015-03-19 – 2015-03-20 (×2): 600 mg via ORAL
  Filled 2015-03-19 (×2): qty 1

## 2015-03-19 NOTE — Progress Notes (Signed)
[facility]  ROUNDING NOTE   Subjective:   Lethargic this morning. No zolpidem given last night.  Creatinine 2.63 (2.92)  Objective:  Vital signs in last 24 hours:  Temp:  [97.5 F (36.4 C)-99 F (37.2 C)] 98.1 F (36.7 C) (03/08 0423) Pulse Rate:  [70-88] 79 (03/08 0423) Resp:  [20] 20 (03/08 0423) BP: (138-176)/(57-81) 161/67 mmHg (03/08 0423) SpO2:  [84 %-94 %] 91 % (03/08 0423) Weight:  [102.33 kg (225 lb 9.6 oz)] 102.33 kg (225 lb 9.6 oz) (03/08 0423)  Weight change: -0.51 kg (-1 lb 2 oz) Filed Weights   03/17/15 0424 03/18/15 0500 03/19/15 0423  Weight: 103.465 kg (228 lb 1.6 oz) 102.84 kg (226 lb 11.5 oz) 102.33 kg (225 lb 9.6 oz)    Intake/Output: I/O last 3 completed shifts: In: 240 [P.O.:240] Out: 8110 [Urine:1550]   Intake/Output this shift:     Physical Exam: General: NAD,   Head: Normocephalic, atraumatic. Moist oral mucosal membranes  Eyes: Anicteric, PERRL  Neck: Supple, trachea midline  Lungs:  Clear to auscultation  Heart: Regular rate and rhythm  Abdomen:  Soft, nontender,   Extremities: trace peripheral edema.  Neurologic: Nonfocal, moving all four extremities  Skin: No lesions       Basic Metabolic Panel:  Recent Labs Lab 03/15/15 1601 03/16/15 0800 03/17/15 0547 03/18/15 0448 03/18/15 1231 03/19/15 0450  NA 137 136 136 139  --  135  K 4.9 4.9 5.1 4.8  --  4.6  CL 105 104 103 107  --  103  CO2 '25 25 23 24  ' --  25  GLUCOSE 176* 140* 207* 135*  --  164*  BUN 25* 29* 35* 42*  --  38*  CREATININE 2.37* 2.70* 2.84* 2.92*  --  2.63*  CALCIUM 8.7* 8.2* 8.2* 7.8*  --  7.9*  MG  --   --   --  2.2  --   --   PHOS  --   --   --   --  4.2  --     Liver Function Tests:  Recent Labs Lab 03/15/15 1601 03/16/15 0800  AST 25 22  ALT 19 17  ALKPHOS 85 76  BILITOT 0.5 0.6  PROT 7.1 6.3*  ALBUMIN 3.2* 2.7*   No results for input(s): LIPASE, AMYLASE in the last 168 hours. No results for input(s): AMMONIA in the last 168  hours.  CBC:  Recent Labs Lab 03/15/15 1601 03/16/15 0800  WBC 9.9 7.2  HGB 11.6* 10.4*  HCT 35.9 31.9*  MCV 81.2 80.1  PLT 179 174    Cardiac Enzymes:  Recent Labs Lab 03/15/15 1601 03/15/15 2005 03/16/15 0217 03/16/15 0800  TROPONINI 0.05* 0.05* 0.05* 0.04*    BNP: Invalid input(s): POCBNP  CBG:  Recent Labs Lab 03/18/15 0735 03/18/15 1139 03/18/15 1651 03/18/15 2148 03/19/15 0747  GLUCAP 173* 144* 121* 136* 141*    Microbiology: Results for orders placed or performed during the hospital encounter of 03/15/15  MRSA PCR Screening     Status: None   Collection Time: 03/15/15  7:56 PM  Result Value Ref Range Status   MRSA by PCR NEGATIVE NEGATIVE Final    Comment:        The GeneXpert MRSA Assay (FDA approved for NASAL specimens only), is one component of a comprehensive MRSA colonization surveillance program. It is not intended to diagnose MRSA infection nor to guide or monitor treatment for MRSA infections.     Coagulation Studies: No  results for input(s): LABPROT, INR in the last 72 hours.  Urinalysis: No results for input(s): COLORURINE, LABSPEC, PHURINE, GLUCOSEU, HGBUR, BILIRUBINUR, KETONESUR, PROTEINUR, UROBILINOGEN, NITRITE, LEUKOCYTESUR in the last 72 hours.  Invalid input(s): APPERANCEUR    Imaging: US Renal  03/19/2015  CLINICAL DATA:  Acute renal failure over the past 3 days EXAM: RENAL / URINARY TRACT ULTRASOUND COMPLETE COMPARISON:  None in PACs FINDINGS: Right Kidney: Length: 7.4 cm. The renal cortical echotexture is increased as compared to the liver. There is no focal mass or hydronephrosis. No stones are evident. Left Kidney: Length: 10.2 cm. The cortical echotexture is increased similar to that on the right. There is no hydronephrosis or solid mass. No parenchymal stones are observed. Bladder: Appears normal for degree of bladder distention. A left ureteral jet is observed. No definite right ureteral jet was demonstrated. A  pleural effusion is observed on the right. IMPRESSION: Increased renal cortical echotexture bilaterally consistent with medical renal disease. There is no evidence of obstruction. Electronically Signed   By: David  Martinique M.D.   On: 03/19/2015 08:51     Medications:     . aspirin EC  81 mg Oral Daily  . docusate sodium  100 mg Oral BID  . fluticasone  2 spray Each Nare Daily  . furosemide  20 mg Oral Daily  . heparin subcutaneous  5,000 Units Subcutaneous 3 times per day  . insulin aspart  0-9 Units Subcutaneous TID WC  . ipratropium-albuterol  3 mL Nebulization QID  . metoprolol tartrate  25 mg Oral BID  . nitroGLYCERIN  2 inch Topical 4 times per day  . pregabalin  75 mg Oral BID  . sodium chloride flush  3 mL Intravenous Q12H   sodium chloride, acetaminophen **OR** acetaminophen, albuterol, bisacodyl, diphenhydrAMINE, diphenhydrAMINE-zinc acetate, guaiFENesin, labetalol, morphine injection, ondansetron **OR** ondansetron (ZOFRAN) IV, oxyCODONE-acetaminophen, sodium chloride flush  Assessment/ Plan:  [name] is a 55 y.o. white female with diastolic congestive heart failure, hypertension, diabetes mellitus type II, who was admitted to [facility] on 03/15/2015  1. Acute renal failure on chronic kidney disease stage IV: baseline creatinine of 1.94, eGFR of 28 on 01/07/15. Patient's acute renal failure seems to be due to overdiuresis. However continues to have some peripheral edema.  Chronic kidney disease with proteinuria and glucosuria: secondary to hypertension and diabetes.  - Agree with lower dose of furosemide. Monitor volume status and renal function. No acute indication for dialysis. Renally dose all medications.  - Renal ultrasound reviewed with patient  2. Hypertension: with acute exacerbation of diastolic congestive heart failure: blood pressure at goal. Pulmonary and peripheral edema improved.  - furosemide 11m daily. Continue metoprolol. Takes lisinopril at home.    3. Anemia of chronic kidney disease: hemoglobin of 10.4  4. Diabetes mellitus type II with chronic kidney disease: hemoglobin A1c 8.2% - continue glucose control.   5. Secondary Hyperparathyroidism of renal origin: PTH 138, phosphorus and calcium at goal.    LOS: 4[place] Alyn Riedinger 3/8/201710:49 AM

## 2015-03-19 NOTE — Progress Notes (Signed)
Patient alert and oriented, vss, no complaints of pain.  On 2LNC.  Unable to wean.  Patient desaturates with exertion on RA.

## 2015-03-19 NOTE — Progress Notes (Signed)
Pt requesting mucinex to help with congestion. MD [facility] notified. Order given for Mucinex 600 BID. RN will administer with nighttime medication. Will continue to monitor.   Iran Sizer M

## 2015-03-19 NOTE — Progress Notes (Signed)
Resting O2 saturations on 2L : 94%   Resting O2 saturation on RA: 91%  Ambulating O2 saturations on RA: 88%

## 2015-03-19 NOTE — Progress Notes (Addendum)
  SUBJECTIVE: Pt is alert and talkative. She denies chest pain or shortness of breath, although she is wheezing. She is getting breathing treatments.   Filed Vitals:   03/19/15 0023 03/19/15 0423 03/19/15 1125 03/19/15 1137  BP: 176/81 161/67 163/68   Pulse: 79 79 69   Temp:  98.1 F (36.7 C) 97.4 F (36.3 C)   TempSrc:  Oral Oral   Resp:  20 19   Height:      Weight:  225 lb 9.6 oz (102.33 kg)    SpO2:  91% 98% 100%    Intake/Output Summary (Last 24 hours) at 03/19/15 1353 Last data filed at 03/19/15 1348  Gross per 24 hour  Intake      0 ml  Output    900 ml  Net   -900 ml    LABS: Basic Metabolic Panel:  Recent Labs  03/18/15 0448 03/18/15 1231 03/19/15 0450  NA 139  --  135  K 4.8  --  4.6  CL 107  --  103  CO2 24  --  25  GLUCOSE 135*  --  164*  BUN 42*  --  38*  CREATININE 2.92*  --  2.63*  CALCIUM 7.8*  --  7.9*  MG 2.2  --   --   PHOS  --  4.2  --    Liver Function Tests: No results for input(s): AST, ALT, ALKPHOS, BILITOT, PROT, ALBUMIN in the last 72 hours. No results for input(s): LIPASE, AMYLASE in the last 72 hours. CBC: No results for input(s): WBC, NEUTROABS, HGB, HCT, MCV, PLT in the last 72 hours. Cardiac Enzymes: No results for input(s): CKTOTAL, CKMB, CKMBINDEX, TROPONINI in the last 72 hours. BNP: Invalid input(s): POCBNP D-Dimer: No results for input(s): DDIMER in the last 72 hours. Hemoglobin A1C:  Recent Labs  03/18/15 1231  HGBA1C 8.2*   Fasting Lipid Panel: No results for input(s): CHOL, HDL, LDLCALC, TRIG, CHOLHDL, LDLDIRECT in the last 72 hours. Thyroid Function Tests: No results for input(s): TSH, T4TOTAL, T3FREE, THYROIDAB in the last 72 hours.  Invalid input(s): FREET3 Anemia Panel: No results for input(s): VITAMINB12, FOLATE, FERRITIN, TIBC, IRON, RETICCTPCT in the last 72 hours.   PHYSICAL EXAM General: Well developed, well nourished, in no acute distress HEENT:  Normocephalic and atramatic Neck:  No JVD.   Lungs: Wheezing bilateral Heart: HRRR . Normal S1 and S2 without gallops or murmurs.  Abdomen: Bowel sounds are positive, abdomen soft and non-tender  Extremities: No clubbing or cyanosis.Trace pedal edema.   Neuro: Alert and oriented X 3. Psych:  Good affect, responds appropriately  TELEMETRY: NSR, 71 bpm  ASSESSMENT AND PLAN: Continue current plan with follow up and out patient testing at [facility]. She has an appointment for Monday at 2pm already scheduled.  Principal Problem:   Acute respiratory failure ([place]) Active Problems:   CHF (congestive heart failure) ([place])   Malignant hypertension   Diabetes mellitus without complication ([place])   Congestive heart disease ([place])    Daune Perch, NP 03/19/2015 1:53 PM

## 2015-03-19 NOTE — Progress Notes (Signed)
SATURATION QUALIFICATIONS: (This note is used to comply with regulatory documentation for home oxygen)  Patient Saturations on Room Air at Rest = 92%  Patient Saturations on Room Air while Ambulating = 82%  Patient Saturations on 2L Liters of oxygen while Ambulating = 95%  Please briefly explain why patient needs home oxygen: CHF

## 2015-03-20 LAB — RENAL FUNCTION PANEL
ALBUMIN: 2.4 g/dL — AB (ref 3.5–5.0)
Anion gap: 6 (ref 5–15)
BUN: 39 mg/dL — AB (ref 6–20)
CALCIUM: 7.6 mg/dL — AB (ref 8.9–10.3)
CO2: 25 mmol/L (ref 22–32)
CREATININE: 2.55 mg/dL — AB (ref 0.44–1.00)
Chloride: 100 mmol/L — ABNORMAL LOW (ref 101–111)
GFR calc Af Amer: 23 mL/min — ABNORMAL LOW (ref >60)
GFR calc non Af Amer: 20 mL/min — ABNORMAL LOW (ref >60)
GLUCOSE: 156 mg/dL — AB (ref 65–99)
PHOSPHORUS: 3.8 mg/dL (ref 2.5–4.6)
Potassium: 4.1 mmol/L (ref 3.5–5.1)
SODIUM: 131 mmol/L — AB (ref 135–145)

## 2015-03-20 LAB — GLUCOSE, CAPILLARY
Glucose-Capillary: 149 mg/dL — ABNORMAL HIGH (ref 65–99)
Glucose-Capillary: 112 mg/dL — ABNORMAL HIGH (ref 65–99)

## 2015-03-20 MED ORDER — ASPIRIN 81 MG PO TBEC: 81.0000 mg | DELAYED_RELEASE_TABLET | Freq: Every day | ORAL | Status: DC

## 2015-03-20 MED ORDER — FUROSEMIDE 20 MG PO TABS: 20.0000 mg | ORAL_TABLET | Freq: Every day | ORAL | Status: DC

## 2015-03-20 MED ORDER — MORPHINE SULFATE (PF) 2 MG/ML IV SOLN
INTRAVENOUS | Status: AC
  Filled 2015-03-20: qty 1

## 2015-03-20 MED ORDER — PREGABALIN 100 MG PO CAPS: 100.0000 mg | ORAL_CAPSULE | Freq: Two times a day (BID) | ORAL | Status: DC

## 2015-03-20 MED ORDER — METOPROLOL TARTRATE 50 MG PO TABS
50.0000 mg | ORAL_TABLET | Freq: Two times a day (BID) | ORAL | Status: DC
  Administered 2015-03-20: 50 mg via ORAL
  Filled 2015-03-20: qty 1

## 2015-03-20 MED ORDER — DOCUSATE SODIUM 100 MG PO CAPS: 100.0000 mg | ORAL_CAPSULE | Freq: Two times a day (BID) | ORAL | Status: DC

## 2015-03-20 MED ORDER — METOPROLOL TARTRATE 50 MG PO TABS: 50.0000 mg | ORAL_TABLET | Freq: Two times a day (BID) | ORAL | Status: DC

## 2015-03-20 NOTE — Progress Notes (Signed)
Anticoag Monitoring: Patient on Heparin 5000 units SQ q8h for DVT prevention. Will order CBC for [id number] AM labs per policy.  Paulina Fusi, PharmD, BCPS 03/20/2015 10:42 AM

## 2015-03-20 NOTE — Care Management (Addendum)
Patient has qualified for home 02 and is for discharge home today.  her husband is anxious to leave.  Agency preference is "whoever can get it here the fastest."  [facility] is in network with [employer] and has received the referral.  Patient would benefit from home health nurse for CHF follow up.  She is agreeable and referral called to [facility].   Her PCP is with [facility]

## 2015-03-20 NOTE — Discharge Instructions (Signed)
Heart Failure Clinic appointment on March 25, 2015 at 9:00am with Darylene Price, [place]. Please call [phone] to reschedule.   Fluid restriction up to 1200 ml daily Low salt diet. Daily weigh your self, if > 2 Lb gain in a day or > 5 Lb in 1 week- take lasix 20 mg 2 times a day for 2 days- and still not able to get rid of extra weight- call your doctor or cardiologist.

## 2015-03-20 NOTE — Discharge Summary (Signed)
[place] [place] NAME: Jacqueline Rodriguez    MR#:  [id number]  DATE OF BIRTH:  08-20-1960  DATE OF ADMISSION:  03/15/2015 ADMITTING PHYSICIAN: Fritzi Mandes, MD  DATE OF DISCHARGE: 03/20/2015  PRIMARY CARE PHYSICIAN: Pcp Not In System    ADMISSION DIAGNOSIS:  Acute pulmonary edema (HCC) [J81.0] Acute congestive heart failure, unspecified congestive heart failure type ([place]) [I50.9]  DISCHARGE DIAGNOSIS:  Principal Problem:   Acute respiratory failure (HCC) Active Problems:   CHF (congestive heart failure) (HCC)   Malignant hypertension   Diabetes mellitus without complication (HCC)   Congestive heart disease (HCC)    Ac diastolic CHF   Ac on Ch renal failure  SECONDARY DIAGNOSIS:   Past Medical History  Diagnosis Date  . Hypertension   . Diabetes mellitus without complication [facility])     HOSPITAL COURSE:   * Acute diastolic congestive heart failure. 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 Also has diabetes risk factor. on Lasix, Beta blockers - Echo: EF 50%. Per Dr. Humphrey Rolls, have to rule out coronary artery disease by doing outpatient workup.   * Acute respiratory failure with hypoxia. Off BiPAP. Weaned off oxygen via cannula. Continue NEB when necessary.  She will need o2 on discharge.  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 Due to cardiorenal syndrome and diuresis Reduced Lasix to 20 mg once a day. Renal US per Dr. Juleen China. Repeat BMP in the morning. This appears stable around 2.6- 2.5  * Accelerated hypertension. Better controlled. Lisinopril held due to renal failure. Metoprolol started. Continue Nitropaste  Change metoprolol to 50 mg BID.  resume low dose lasix.   councelled about fluid restriction.  * Diabetes mellitus. Controlled. Sliding scale insulin and diabetic diet. Hold glipizide-metformin due to ARF on CKD.   Blood sugar is nicely under control around  100-150.  * Mild anemia of chronic disease. Stable.  * DVT prophylaxis with heparin   DISCHARGE CONDITIONS:   Stable.  CONSULTS OBTAINED:  Treatment Team:  Dionisio David, MD Lavonia Dana, MD  DRUG ALLERGIES:  No Known Allergies  DISCHARGE MEDICATIONS:   Current Discharge Medication List    START taking these medications   Details  aspirin EC 81 MG EC tablet Take 1 tablet (81 mg total) by mouth daily. Qty: 30 tablet, Refills: 0    docusate sodium (COLACE) 100 MG capsule Take 1 capsule (100 mg total) by mouth 2 (two) times daily. Qty: 10 capsule, Refills: 0    furosemide (LASIX) 20 MG tablet Take 1 tablet (20 mg total) by mouth daily. Qty: 30 tablet, Refills: 0    metoprolol (LOPRESSOR) 50 MG tablet Take 1 tablet (50 mg total) by mouth 2 (two) times daily. Qty: 60 tablet, Refills: 0      CONTINUE these medications which have CHANGED   Details  pregabalin (LYRICA) 100 MG capsule Take 1 capsule (100 mg total) by mouth 2 (two) times daily. Qty: 30 capsule, Refills: 0      CONTINUE these medications which have NOT CHANGED   Details  albuterol (PROVENTIL HFA;VENTOLIN HFA) 108 (90 Base) MCG/ACT inhaler Inhale 2 puffs into the lungs every 6 (six) hours as needed for wheezing or shortness of breath. Qty: 1 Inhaler, Refills: 2    gabapentin (NEURONTIN) 100 MG capsule Take 100 mg by mouth 3 (three) times daily as needed. For neuropathy. Refills: 2      STOP taking these medications  glipiZIDE-metformin (METAGLIP) 2.5-500 MG tablet      lisinopril (PRINIVIL,ZESTRIL) 5 MG tablet          DISCHARGE INSTRUCTIONS:    Follow with Cardio and nephrology clinic in 1 week.  If you experience worsening of your admission symptoms, develop shortness of breath, life threatening emergency, suicidal or homicidal thoughts you must seek medical attention immediately by calling 911 or calling your MD immediately  if symptoms less severe.  You Must read complete  instructions/literature along with all the possible adverse reactions/side effects for all the Medicines you take and that have been prescribed to you. Take any new Medicines after you have completely understood and accept all the possible adverse reactions/side effects.   Please note  You were cared for by a hospitalist during your hospital stay. If you have any questions about your discharge medications or the care you received while you were in the hospital after you are discharged, you can call the unit and asked to speak with the hospitalist on call if the hospitalist that took care of you is not available. Once you are discharged, your primary care physician will handle any further medical issues. Please note that NO REFILLS for any discharge medications will be authorized once you are discharged, as it is imperative that you return to your primary care physician (or establish a relationship with a primary care physician if you do not have one) for your aftercare needs so that they can reassess your need for medications and monitor your lab values.    Today   CHIEF COMPLAINT:   Chief Complaint  Patient presents with  . Shortness of Breath    HISTORY OF PRESENT ILLNESS:  Jacqueline Rodriguez  is a 55 y.o. female with a known history of hypertension diabetes presented to the emergency room with respiratory distress and blood pressure systolic over [id number] and chest x-ray showing congestive heart failure. She also has renal insufficiency. She says that she's been short of breath for the past 6 weeks and gradually got worse past couple of days and was admitted to the ICU. At this time she is feeling much better.  VITAL SIGNS:  Blood pressure 174/79, pulse 79, temperature 97.8 F (36.6 C), temperature source Oral, resp. rate 18, height 5\' 9"  (1.753 m), weight 101.87 kg (224 lb 9.3 oz), SpO2 97 %.  I/O:   Intake/Output Summary (Last 24 hours) at 03/20/15 1046 Last data filed at 03/19/15 2100  Gross per  24 hour  Intake      0 ml  Output    750 ml  Net   -750 ml    PHYSICAL EXAMINATION:   GENERAL: 55 y.o.-year-old patient lying in the bed with no acute distress.obese EYES: Pupils equal, round, reactive to light and accommodation. No scleral icterus. Extraocular muscles intact.  HEENT: Head atraumatic, normocephalic. Oropharynx and nasopharynx clear.  NECK: Supple, no jugular venous distention. No thyroid enlargement, no tenderness.  LUNGS- Normal breath sound bilaterally. No wheezing or rales. Some crepitations. No use of accessory muscles to breath. CARDIOVASCULAR: S1, S2 normal. No murmurs, rubs, or gallops.  ABDOMEN: Soft, nontender, nondistended. Bowel sounds present. No organomegaly or mass.  EXTREMITIES: No cyanosis, clubbing. 1+ lower extremity edema  NEUROLOGIC: Cranial nerves II through XII are intact. No focal Motor or sensory deficits b/l.  PSYCHIATRIC: The patient is alert and oriented x 3.  SKIN: No obvious rash, lesion, or ulcer.   DATA REVIEW:   CBC  Recent Labs Lab 03/16/15 0800  WBC 7.2  HGB 10.4*  HCT 31.9*  PLT 174    Chemistries   Recent Labs Lab 03/16/15 0800  03/18/15 0448  03/20/15 0750  NA 136  < > 139  < > 131*  K 4.9  < > 4.8  < > 4.1  CL 104  < > 107  < > 100*  CO2 25  < > 24  < > 25  GLUCOSE 140*  < > 135*  < > 156*  BUN 29*  < > 42*  < > 39*  CREATININE 2.70*  < > 2.92*  < > 2.55*  CALCIUM 8.2*  < > 7.8*  < > 7.6*  MG  --   --  2.2  --   --   AST 22  --   --   --   --   ALT 17  --   --   --   --   ALKPHOS 76  --   --   --   --   BILITOT 0.6  --   --   --   --   < > = values in this interval not displayed.  Cardiac Enzymes  Recent Labs Lab 03/16/15 0800  TROPONINI 0.04*    Microbiology Results  Results for orders placed or performed during the hospital encounter of 03/15/15  MRSA PCR Screening     Status: None   Collection Time: 03/15/15  7:56 PM  Result Value Ref Range Status   MRSA by PCR NEGATIVE NEGATIVE  Final    Comment:        The GeneXpert MRSA Assay (FDA approved for NASAL specimens only), is one component of a comprehensive MRSA colonization surveillance program. It is not intended to diagnose MRSA infection nor to guide or monitor treatment for MRSA infections.     RADIOLOGY:  US Renal  03/19/2015  CLINICAL DATA:  Acute renal failure over the past 3 days EXAM: RENAL / URINARY TRACT ULTRASOUND COMPLETE COMPARISON:  None in PACs FINDINGS: Right Kidney: Length: 7.4 cm. The renal cortical echotexture is increased as compared to the liver. There is no focal mass or hydronephrosis. No stones are evident. Left Kidney: Length: 10.2 cm. The cortical echotexture is increased similar to that on the right. There is no hydronephrosis or solid mass. No parenchymal stones are observed. Bladder: Appears normal for degree of bladder distention. A left ureteral jet is observed. No definite right ureteral jet was demonstrated. A pleural effusion is observed on the right. IMPRESSION: Increased renal cortical echotexture bilaterally consistent with medical renal disease. There is no evidence of obstruction. Electronically Signed   By: David  Martinique M.D.   On: 03/19/2015 08:51      Management plans discussed with the patient, family and they are in agreement.  CODE STATUS:     Code Status Orders        Start     Ordered   03/15/15 1957  Full code   Continuous     03/15/15 1956    Code Status History    Date Active Date Inactive Code Status Order ID Comments User Context   This patient has a current code status but no historical code status.      TOTAL TIME TAKING CARE OF THIS PATIENT: 35 minutes.    Vaughan Basta M.D on 03/20/2015 at 10:46 AM  Between 7am to 6pm - Pager - [phone]  After 6pm go to [email or web address] - password [email or web address] [facility]  Office  [phone]  CC: Primary care physician; Pcp Not In System   Note: This dictation was prepared  with Dragon dictation along with smaller phrase technology. Any transcriptional errors that result from this process are unintentional.

## 2015-03-20 NOTE — Progress Notes (Signed)
[name]    MR#:  [id number]  DATE OF BIRTH:  21-Oct-1960  SUBJECTIVE:  CHIEF COMPLAINT:   Chief Complaint  Patient presents with  . Shortness of Breath   No SOB and cough. on O2 [place] 2L. Off Bipap. Renal func stable around 2.5-2.7  REVIEW OF SYSTEMS:    Review of Systems  Constitutional: Positive for malaise/fatigue. Negative for fever and chills.  HENT: Negative for sore throat.   Eyes: Negative for blurred vision, double vision and pain.  Respiratory: No cough or shortness of breath. Negative for hemoptysis and wheezing.   Cardiovascular: No orthopnea but has leg swelling. Negative for chest pain and palpitations.  Gastrointestinal: Negative for heartburn, nausea, vomiting, abdominal pain, diarrhea and constipation.  Genitourinary: Negative for dysuria and hematuria.  Musculoskeletal: Negative for back pain and joint pain.  Skin: No itching and rash.  Neurological: Positive for weakness. Negative for sensory change, speech change, focal weakness and headaches.  Endo/Heme/Allergies: Does not bruise/bleed easily.  Psychiatric/Behavioral: Negative for depression. The patient is not nervous/anxious.     DRUG ALLERGIES:  No Known Allergies  VITALS:  Blood pressure 169/61, pulse 77, temperature 98.3 F (36.8 C), temperature source Oral, resp. rate 17, height 5\' 9"  (1.753 m), weight 101.87 kg (224 lb 9.3 oz), SpO2 95 %.  PHYSICAL EXAMINATION:   Physical Exam  GENERAL:  55 y.o.-year-old patient lying in the bed with no acute distress.obese EYES: Pupils equal, round, reactive to light and accommodation. No scleral icterus. Extraocular muscles intact.  HEENT: Head atraumatic, normocephalic. Oropharynx and nasopharynx clear.  NECK:  Supple, no jugular venous distention. No thyroid enlargement, no tenderness.  LUNGS- Normal breath sound bilaterally. No wheezing or rales. Some crepitations. No use of  accessory muscles to breath. CARDIOVASCULAR: S1, S2 normal. No murmurs, rubs, or gallops.  ABDOMEN: Soft, nontender, nondistended. Bowel sounds present. No organomegaly or mass.  EXTREMITIES: No cyanosis, clubbing.  1+ lower extremity edema  NEUROLOGIC: Cranial nerves II through XII are intact. No focal Motor or sensory deficits b/l.   PSYCHIATRIC: The patient is alert and oriented x 3.  SKIN: No obvious rash, lesion, or ulcer.   LABORATORY PANEL:   CBC  Recent Labs Lab 03/16/15 0800  WBC 7.2  HGB 10.4*  HCT 31.9*  PLT 174   ------------------------------------------------------------------------------------------------------------------ Chemistries   Recent Labs Lab 03/16/15 0800  03/18/15 0448 03/19/15 0450  NA 136  < > 139 135  K 4.9  < > 4.8 4.6  CL 104  < > 107 103  CO2 25  < > 24 25  GLUCOSE 140*  < > 135* 164*  BUN 29*  < > 42* 38*  CREATININE 2.70*  < > 2.92* 2.63*  CALCIUM 8.2*  < > 7.8* 7.9*  MG  --   --  2.2  --   AST 22  --   --   --   ALT 17  --   --   --   ALKPHOS 76  --   --   --   BILITOT 0.6  --   --   --   < > = values in this interval not displayed. ------------------------------------------------------------------------------------------------------------------  Cardiac Enzymes  Recent Labs Lab 03/16/15 0800  TROPONINI 0.04*   ------------------------------------------------------------------------------------------------------------------  RADIOLOGY:  US Renal  03/19/2015  CLINICAL DATA:  Acute renal failure over the past 3 days EXAM: RENAL / URINARY TRACT ULTRASOUND COMPLETE COMPARISON:  None  in PACs FINDINGS: Right Kidney: Length: 7.4 cm. The renal cortical echotexture is increased as compared to the liver. There is no focal mass or hydronephrosis. No stones are evident. Left Kidney: Length: 10.2 cm. The cortical echotexture is increased similar to that on the right. There is no hydronephrosis or solid mass. No parenchymal stones are  observed. Bladder: Appears normal for degree of bladder distention. A left ureteral jet is observed. No definite right ureteral jet was demonstrated. A pleural effusion is observed on the right. IMPRESSION: Increased renal cortical echotexture bilaterally consistent with medical renal disease. There is no evidence of obstruction. Electronically Signed   By: David  Martinique M.D.   On: 03/19/2015 08:51     ASSESSMENT AND PLAN:   * Acute diastolic congestive heart failure.  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 Also has diabetes risk factor. on Lasix, Beta blockers - Echo: EF 50%. Per Dr. Humphrey Rolls, have to rule out coronary artery disease by doing outpatient workup.   * Acute respiratory failure with hypoxia. Off BiPAP. Weaned off oxygen via cannula. Continue NEB when necessary.   She may need o2 on discharge.  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 Due to cardiorenal syndrome and diuresis Reduced Lasix to 20 mg once a day. Renal US per Dr. Juleen China. Repeat BMP in the morning. This appears stable around 2.6  * Accelerated hypertension. Better controlled. Lisinopril held due to renal failure. Metoprolol started. Continue Nitropaste   Change metoprolol to 50 mg BID.  * Diabetes mellitus. Controlled. Sliding scale insulin and diabetic diet. Hold glipizide-metformin due to ARF on CKD.  * Mild anemia of chronic disease. Stable.  * DVT prophylaxis with heparin  I discussed with Dr. Juleen China, who agrees the current plan. All the records are reviewed and case discussed with Care Management/Social Workerr. Management plans discussed with the patient, family and they are in agreement. Greater than 50% time was spent on coordination of care and face-to-face counseling. CODE STATUS: FULL  DVT Prophylaxis: SCDs  TOTAL TIME TAKING CARE OF THIS PATIENT: 30 minutes.   POSSIBLE D/C IN 1- 2 DAYS, DEPENDING ON CLINICAL CONDITION.  Vaughan Basta  M.D on 03/20/2015 at 8:05 AM  Between 7am to 6pm - Pager - [phone]  After 6pm go to [email or web address] - password [email or web address] [place] [facility]  Office  [phone]  CC: Primary care physician; Pcp Not In System  Note: This dictation was prepared with Dragon dictation along with smaller phrase technology. Any transcriptional errors that result from this process are unintentional.

## 2015-03-20 NOTE — Progress Notes (Addendum)
SUBJECTIVE: Patient is feeling much better   Filed Vitals:   03/20/15 0454 03/20/15 0720 03/20/15 0818 03/20/15 0822  BP:   186/69 174/79  Pulse:   77 79  Temp:   97.8 F (36.6 C)   TempSrc:   Oral   Resp:   18   Height:      Weight:      SpO2: 92% 95% 97%     Intake/Output Summary (Last 24 hours) at 03/20/15 0909 Last data filed at 03/19/15 2100  Gross per 24 hour  Intake      0 ml  Output    750 ml  Net   -750 ml    LABS: Basic Metabolic Panel:  Recent Labs  03/18/15 0448 03/18/15 1231 03/19/15 0450 03/20/15 0750  NA 139  --  135 131*  K 4.8  --  4.6 4.1  CL 107  --  103 100*  CO2 24  --  25 25  GLUCOSE 135*  --  164* 156*  BUN 42*  --  38* 39*  CREATININE 2.92*  --  2.63* 2.55*  CALCIUM 7.8*  --  7.9* 7.6*  MG 2.2  --   --   --   PHOS  --  4.2  --  3.8   Liver Function Tests:  Recent Labs  03/20/15 0750  ALBUMIN 2.4*   No results for input(s): LIPASE, AMYLASE in the last 72 hours. CBC: No results for input(s): WBC, NEUTROABS, HGB, HCT, MCV, PLT in the last 72 hours. Cardiac Enzymes: No results for input(s): CKTOTAL, CKMB, CKMBINDEX, TROPONINI in the last 72 hours. BNP: Invalid input(s): POCBNP D-Dimer: No results for input(s): DDIMER in the last 72 hours. Hemoglobin A1C:  Recent Labs  03/18/15 1231  HGBA1C 8.2*   Fasting Lipid Panel: No results for input(s): CHOL, HDL, LDLCALC, TRIG, CHOLHDL, LDLDIRECT in the last 72 hours. Thyroid Function Tests: No results for input(s): TSH, T4TOTAL, T3FREE, THYROIDAB in the last 72 hours.  Invalid input(s): FREET3 Anemia Panel: No results for input(s): VITAMINB12, FOLATE, FERRITIN, TIBC, IRON, RETICCTPCT in the last 72 hours.   PHYSICAL EXAM General: Well developed, well nourished, in no acute distress HEENT:  Normocephalic and atramatic Neck:  No JVD.  Lungs: Clear bilaterally to auscultation and percussion. Heart: HRRR . Normal S1 and S2 without gallops or murmurs.  Abdomen: Bowel sounds are  positive, abdomen soft and non-tender  Msk:  Back normal, normal gait. Normal strength and tone for age. Extremities: No clubbing, cyanosis or edema.   Neuro: Alert and oriented X 3. Psych:  Good affect, responds appropriately  TELEMETRY: Sinus rhythm  ASSESSMENT AND PLAN: Patient is feeling much better with shortness of breath much improved. Patient does have congestive heart failure with renal insufficiency and will need outpatient Lexiscan Myoview. Patient was given my card as well as office information and will have a follow-up next week. To go home.  Principal Problem:   Acute respiratory failure (HCC) Active Problems:   CHF (congestive heart failure) (HCC)   Malignant hypertension   Diabetes mellitus without complication (HCC)   Congestive heart disease (HCC)    [place] A, MD, [facility] 03/20/2015 9:09 AM

## 2015-03-20 NOTE — Progress Notes (Signed)
Orders for pt to d/c to home. Pt qualified for home O2. O2 provided by [facility]. Discharge instructions and prescriptions given to pt and husband with verbal acknowledgment of understanding. IV and tele removed and pt escorted of unit via wheelchair by nursing staff.

## 2015-03-20 NOTE — Progress Notes (Signed)
[facility]  ROUNDING NOTE   Subjective:   Creatinine 2.55 ( 2.63)   Objective:  Vital signs in last 24 hours:  Temp:  [97.8 F (36.6 C)-98.9 F (37.2 C)] 97.9 F (36.6 C) (03/09 1108) Pulse Rate:  [61-82] 61 (03/09 1108) Resp:  [16-22] 16 (03/09 1108) BP: (158-186)/(57-79) 166/72 mmHg (03/09 1108) SpO2:  [88 %-100 %] 93 % (03/09 1116) Weight:  [101.87 kg (224 lb 9.3 oz)] 101.87 kg (224 lb 9.3 oz) (03/09 0449)  Weight change: -0.46 kg (-1 lb 0.2 oz) Filed Weights   03/18/15 0500 03/19/15 0423 03/20/15 0449  Weight: 102.84 kg (226 lb 11.5 oz) 102.33 kg (225 lb 9.6 oz) 101.87 kg (224 lb 9.3 oz)    Intake/Output: I/O last 3 completed shifts: In: -  Out: 1150 [Urine:1150]   Intake/Output this shift:     Physical Exam: General: NAD,   Head: Normocephalic, atraumatic. Moist oral mucosal membranes  Eyes: Anicteric, PERRL  Neck: Supple, trachea midline  Lungs:  Clear to auscultation  Heart: Regular rate and rhythm  Abdomen:  Soft, nontender,   Extremities: No peripheral edema.  Neurologic: Nonfocal, moving all four extremities  Skin: No lesions       Basic Metabolic Panel:  Recent Labs Lab 03/16/15 0800 03/17/15 0547 03/18/15 0448 03/18/15 1231 03/19/15 0450 03/20/15 0750  NA 136 136 139  --  135 131*  K 4.9 5.1 4.8  --  4.6 4.1  CL 104 103 107  --  103 100*  CO2 '25 23 24  ' --  25 25  GLUCOSE 140* 207* 135*  --  164* 156*  BUN 29* 35* 42*  --  38* 39*  CREATININE 2.70* 2.84* 2.92*  --  2.63* 2.55*  CALCIUM 8.2* 8.2* 7.8*  --  7.9* 7.6*  MG  --   --  2.2  --   --   --   PHOS  --   --   --  4.2  --  3.8    Liver Function Tests:  Recent Labs Lab 03/15/15 1601 03/16/15 0800 03/20/15 0750  AST 25 22  --   ALT 19 17  --   ALKPHOS 85 76  --   BILITOT 0.5 0.6  --   PROT 7.1 6.3*  --   ALBUMIN 3.2* 2.7* 2.4*   No results for input(s): LIPASE, AMYLASE in the last 168 hours. No results for input(s): AMMONIA in the last 168  hours.  CBC:  Recent Labs Lab 03/15/15 1601 03/16/15 0800  WBC 9.9 7.2  HGB 11.6* 10.4*  HCT 35.9 31.9*  MCV 81.2 80.1  PLT 179 174    Cardiac Enzymes:  Recent Labs Lab 03/15/15 1601 03/15/15 2005 03/16/15 0217 03/16/15 0800  TROPONINI 0.05* 0.05* 0.05* 0.04*    BNP: Invalid input(s): POCBNP  CBG:  Recent Labs Lab 03/19/15 1142 03/19/15 1619 03/19/15 2059 03/20/15 0724 03/20/15 1106  GLUCAP 165* 125* 187* 149* 112*    Microbiology: Results for orders placed or performed during the hospital encounter of 03/15/15  MRSA PCR Screening     Status: None   Collection Time: 03/15/15  7:56 PM  Result Value Ref Range Status   MRSA by PCR NEGATIVE NEGATIVE Final    Comment:        The GeneXpert MRSA Assay (FDA approved for NASAL specimens only), is one component of a comprehensive MRSA colonization surveillance program. It is not intended to diagnose MRSA infection nor to guide or monitor treatment for  MRSA infections.     Coagulation Studies: No results for input(s): LABPROT, INR in the last 72 hours.  Urinalysis: No results for input(s): COLORURINE, LABSPEC, PHURINE, GLUCOSEU, HGBUR, BILIRUBINUR, KETONESUR, PROTEINUR, UROBILINOGEN, NITRITE, LEUKOCYTESUR in the last 72 hours.  Invalid input(s): APPERANCEUR    Imaging: US Renal  03/19/2015  CLINICAL DATA:  Acute renal failure over the past 3 days EXAM: RENAL / URINARY TRACT ULTRASOUND COMPLETE COMPARISON:  None in PACs FINDINGS: Right Kidney: Length: 7.4 cm. The renal cortical echotexture is increased as compared to the liver. There is no focal mass or hydronephrosis. No stones are evident. Left Kidney: Length: 10.2 cm. The cortical echotexture is increased similar to that on the right. There is no hydronephrosis or solid mass. No parenchymal stones are observed. Bladder: Appears normal for degree of bladder distention. A left ureteral jet is observed. No definite right ureteral jet was demonstrated. A  pleural effusion is observed on the right. IMPRESSION: Increased renal cortical echotexture bilaterally consistent with medical renal disease. There is no evidence of obstruction. Electronically Signed   By: David  Martinique M.D.   On: 03/19/2015 08:51     Medications:     . aspirin EC  81 mg Oral Daily  . docusate sodium  100 mg Oral BID  . fluticasone  2 spray Each Nare Daily  . furosemide  20 mg Oral Daily  . guaiFENesin  600 mg Oral BID  . heparin subcutaneous  5,000 Units Subcutaneous 3 times per day  . insulin aspart  0-9 Units Subcutaneous TID WC  . ipratropium-albuterol  3 mL Nebulization QID  . metoprolol tartrate  50 mg Oral BID  . nitroGLYCERIN  2 inch Topical 4 times per day  . pregabalin  75 mg Oral BID  . sodium chloride flush  3 mL Intravenous Q12H   sodium chloride, acetaminophen **OR** acetaminophen, albuterol, bisacodyl, diphenhydrAMINE, diphenhydrAMINE-zinc acetate, guaiFENesin, labetalol, morphine injection, ondansetron **OR** ondansetron (ZOFRAN) IV, oxyCODONE-acetaminophen, sodium chloride flush  Assessment/ Plan:  Jacqueline Rodriguez is a 55 y.o. white female with diastolic congestive heart failure, hypertension, diabetes mellitus type II, who was admitted to [facility] on 03/15/2015  1. Acute renal failure on chronic kidney disease stage IV: baseline creatinine of 1.94, eGFR of 28 on 01/07/15. Patient's acute renal failure seems to be due to overdiuresis. However continues to have some peripheral edema.  Chronic kidney disease with proteinuria and glucosuria: secondary to hypertension and diabetes.  - Agree with lower dose of furosemide. Monitor volume status and renal function. No acute indication for dialysis. Renally dose all medications.  - Renal ultrasound reviewed with patient  2. Hypertension: with acute exacerbation of diastolic congestive heart failure: blood pressure at goal. Pulmonary and peripheral edema improved.  - furosemide 79m daily. Continue  metoprolol. Takes lisinopril at home.   3. Anemia of chronic kidney disease: hemoglobin of 10.4  4. Diabetes mellitus type II with chronic kidney disease: hemoglobin A1c 8.2% - continue glucose control.   5. Secondary Hyperparathyroidism of renal origin: PTH 138, phosphorus and calcium at goal.    LOS: 5[place] 3/9/201711:30 AM

## 2015-03-25 ENCOUNTER — Ambulatory Visit: Payer: BLUE CROSS/BLUE SHIELD | Attending: Family | Admitting: Family

## 2015-03-25 ENCOUNTER — Encounter: Payer: Self-pay | Admitting: Family

## 2015-03-25 VITALS — BP 153/59 | HR 52 | Resp 18 | Ht 69.0 in | Wt 225.0 lb

## 2015-03-25 DIAGNOSIS — E1122 Type 2 diabetes mellitus with diabetic chronic kidney disease: Secondary | ICD-10-CM

## 2015-03-25 DIAGNOSIS — I5032 Chronic diastolic (congestive) heart failure: Secondary | ICD-10-CM | POA: Insufficient documentation

## 2015-03-25 DIAGNOSIS — Z79899 Other long term (current) drug therapy: Secondary | ICD-10-CM | POA: Insufficient documentation

## 2015-03-25 DIAGNOSIS — Z87891 Personal history of nicotine dependence: Secondary | ICD-10-CM | POA: Insufficient documentation

## 2015-03-25 DIAGNOSIS — R809 Proteinuria, unspecified: Secondary | ICD-10-CM | POA: Diagnosis not present

## 2015-03-25 DIAGNOSIS — E119 Type 2 diabetes mellitus without complications: Secondary | ICD-10-CM | POA: Insufficient documentation

## 2015-03-25 DIAGNOSIS — N179 Acute kidney failure, unspecified: Secondary | ICD-10-CM | POA: Diagnosis not present

## 2015-03-25 DIAGNOSIS — E118 Type 2 diabetes mellitus with unspecified complications: Secondary | ICD-10-CM | POA: Insufficient documentation

## 2015-03-25 DIAGNOSIS — R0602 Shortness of breath: Secondary | ICD-10-CM | POA: Diagnosis not present

## 2015-03-25 DIAGNOSIS — Z7982 Long term (current) use of aspirin: Secondary | ICD-10-CM | POA: Diagnosis not present

## 2015-03-25 DIAGNOSIS — I129 Hypertensive chronic kidney disease with stage 1 through stage 4 chronic kidney disease, or unspecified chronic kidney disease: Secondary | ICD-10-CM | POA: Diagnosis not present

## 2015-03-25 DIAGNOSIS — N184 Chronic kidney disease, stage 4 (severe): Secondary | ICD-10-CM | POA: Diagnosis not present

## 2015-03-25 DIAGNOSIS — N289 Disorder of kidney and ureter, unspecified: Secondary | ICD-10-CM | POA: Insufficient documentation

## 2015-03-25 DIAGNOSIS — N2581 Secondary hyperparathyroidism of renal origin: Secondary | ICD-10-CM | POA: Diagnosis not present

## 2015-03-25 DIAGNOSIS — I10 Essential (primary) hypertension: Secondary | ICD-10-CM | POA: Insufficient documentation

## 2015-03-25 DIAGNOSIS — Z8249 Family history of ischemic heart disease and other diseases of the circulatory system: Secondary | ICD-10-CM | POA: Insufficient documentation

## 2015-03-25 NOTE — Progress Notes (Signed)
Subjective:    Patient ID: Jacqueline Rodriguez, female    DOB: 12-24-1960, 55 y.o.   MRN: [id number]  Congestive Heart Failure Presents for initial visit. The disease course has been stable. Associated symptoms include fatigue and shortness of breath. Pertinent negatives include no abdominal pain, chest pain, edema, muscle weakness, orthopnea or palpitations. The symptoms have been stable. Past treatments include beta blockers, oxygen and salt and fluid restriction. The treatment provided moderate relief. Compliance with prior treatments has been good. Her past medical history is significant for DM and HTN. She has one 1st degree relative with heart disease.  Hypertension This is a chronic problem. The current episode started more than 1 year ago. The problem is unchanged. Associated symptoms include malaise/fatigue and shortness of breath. Pertinent negatives include no chest pain, headaches, neck pain, palpitations, peripheral edema or PND. There are no associated agents to hypertension. Risk factors for coronary artery disease include diabetes mellitus, family history and smoking/tobacco exposure. Past treatments include beta blockers, ACE inhibitors, diuretics and lifestyle changes. The current treatment provides moderate improvement. Compliance problems include exercise.  Hypertensive end-organ damage includes kidney disease and heart failure.   Past Medical History  Diagnosis Date  . Hypertension   . Diabetes mellitus without complication ([place])   . CHF (congestive heart failure) ([place])   . Chronic kidney disease     History reviewed. No pertinent past surgical history.  Family History  Problem Relation Age of Onset  . Hypertension Mother   . Diabetes Mellitus II Mother   . CAD Father   . Hypertension Father   . Heart attack Father     Social History  Substance Use Topics  . Smoking status: Former Smoker    Types: Cigarettes    Quit date: 03/24/1985  . Smokeless tobacco: Never Used   . Alcohol Use: No    No Known Allergies  Prior to Admission medications   Medication Sig Start Date End Date Taking? Authorizing Provider  albuterol (PROVENTIL HFA;VENTOLIN HFA) 108 (90 Base) MCG/ACT inhaler Inhale 2 puffs into the lungs every 6 (six) hours as needed for wheezing or shortness of breath. 01/08/15  Yes Gregor Hams, MD  aspirin EC 81 MG EC tablet Take 1 tablet (81 mg total) by mouth daily. 03/20/15  Yes Vaughan Basta, MD  furosemide (LASIX) 20 MG tablet Take 1 tablet (20 mg total) by mouth daily. 03/20/15  Yes Vaughan Basta, MD  gabapentin (NEURONTIN) 100 MG capsule Take 100 mg by mouth 3 (three) times daily as needed. For neuropathy. 03/05/15  Yes Historical Provider, MD  metoprolol (LOPRESSOR) 50 MG tablet Take 1 tablet (50 mg total) by mouth 2 (two) times daily. 03/20/15  Yes Vaughan Basta, MD  pregabalin (LYRICA) 100 MG capsule Take 1 capsule (100 mg total) by mouth 2 (two) times daily. 03/20/15  Yes Vaughan Basta, MD      Review of Systems  Constitutional: Positive for malaise/fatigue, appetite change (not hungry) and fatigue.  HENT: Positive for rhinorrhea. Negative for congestion and sore throat.   Eyes: Negative.   Respiratory: Positive for cough (loose, non-productive) and shortness of breath. Negative for chest tightness and wheezing.   Cardiovascular: Negative for chest pain, palpitations, leg swelling and PND.  Gastrointestinal: Negative for abdominal pain and abdominal distention.  Endocrine: Negative.   Genitourinary: Negative.   Musculoskeletal: Negative for back pain, muscle weakness and neck pain.  Skin: Negative.   Allergic/Immunologic: Negative.   Neurological: Positive for light-headedness (if changing positions too  quickly). Negative for dizziness, weakness and headaches.  Hematological: Negative for adenopathy. Does not bruise/bleed easily.  Psychiatric/Behavioral: Negative for sleep disturbance (wearing oxygen at night  @ 2L) and dysphoric mood. The patient is not nervous/anxious.        Objective:   Physical Exam  Constitutional: She is oriented to person, place, and time. She appears well-developed and well-nourished.  HENT:  Head: Normocephalic and atraumatic.  Eyes: Conjunctivae are normal. Pupils are equal, round, and reactive to light.  Neck: Normal range of motion. Neck supple.  Cardiovascular: Regular rhythm.  Bradycardia present.   Pulmonary/Chest: Effort normal. She has no wheezes. She has no rales.  Abdominal: Soft. She exhibits no distension. There is no tenderness.  Musculoskeletal: She exhibits no edema or tenderness.  Neurological: She is alert and oriented to person, place, and time.  Skin: Skin is warm and dry.  Psychiatric: She has a normal mood and affect. Her behavior is normal. Thought content normal.  Nursing note and vitals reviewed.    BP 153/59 mmHg  Pulse 52  Resp 18  Ht 5\' 9"  (1.753 m)  Wt 225 lb (102.059 kg)  BMI 33.21 kg/m2  SpO2 95%      Assessment & Plan:  1: Chronic heart failure with preserved ejection fraction- Patient presents with fatigue upon exertion along with some shortness of breath. She feels like she's sleeping well and wears oxygen at 2L at bedtime only. Denies any swelling in her legs or abdomen. Hasn't been weighing herself but does have scales at home. Instructed her to weigh daily in the morning after using the bathroom and to call for an overnight weight gain of >2 pounds or a weekly weight gain of >5 pounds. She is now not adding any salt to her food and says that the food tastes different. Her husband usually does the grocery shopping and has been reading food labels. Discussed the importance of following a 2000mg  sodium diet and written information was given to her about this. Will discuss pulmonary rehab referral with her at her next appointment. [facility] PharmD went in and reviewed medications with the patient.  2: HTN- Blood pressure looks good  today. Lisinopril was stopped in the hospital due to renal disease. 3: Diabetes with stage 4 chronic kidney disease- Patient says that her metformin was stopped due to renal disease and she's currently not taking anything for her diabetes. She's also not checking it at home and she was encouraged to begin checking it on a daily basis. She did not bring her medications nor a list and her pharmacy confirms that she's taking both lyrica and gabapentin. She sees her PCP later today to follow-up in regards to her diabetes.   Return here in 1 month or sooner for any questions/problems before then.

## 2015-03-25 NOTE — Patient Instructions (Signed)
Begin weighing daily and call for an overnight weight gain of > 2 pounds or a weekly weight gain of >5 pounds. 

## 2015-04-25 ENCOUNTER — Ambulatory Visit: Payer: BLUE CROSS/BLUE SHIELD | Admitting: Family

## 2015-04-30 ENCOUNTER — Encounter: Payer: Self-pay | Admitting: Family

## 2015-04-30 ENCOUNTER — Ambulatory Visit: Payer: BLUE CROSS/BLUE SHIELD | Attending: Family | Admitting: Family

## 2015-04-30 VITALS — BP 180/76 | HR 78 | Resp 20 | Ht 69.0 in | Wt 214.0 lb

## 2015-04-30 DIAGNOSIS — R5383 Other fatigue: Secondary | ICD-10-CM | POA: Insufficient documentation

## 2015-04-30 DIAGNOSIS — Z7951 Long term (current) use of inhaled steroids: Secondary | ICD-10-CM | POA: Insufficient documentation

## 2015-04-30 DIAGNOSIS — Z87891 Personal history of nicotine dependence: Secondary | ICD-10-CM | POA: Insufficient documentation

## 2015-04-30 DIAGNOSIS — Z833 Family history of diabetes mellitus: Secondary | ICD-10-CM | POA: Diagnosis not present

## 2015-04-30 DIAGNOSIS — I129 Hypertensive chronic kidney disease with stage 1 through stage 4 chronic kidney disease, or unspecified chronic kidney disease: Secondary | ICD-10-CM | POA: Insufficient documentation

## 2015-04-30 DIAGNOSIS — M545 Low back pain, unspecified: Secondary | ICD-10-CM

## 2015-04-30 DIAGNOSIS — Z7982 Long term (current) use of aspirin: Secondary | ICD-10-CM | POA: Diagnosis not present

## 2015-04-30 DIAGNOSIS — I5032 Chronic diastolic (congestive) heart failure: Secondary | ICD-10-CM | POA: Insufficient documentation

## 2015-04-30 DIAGNOSIS — I110 Hypertensive heart disease with heart failure: Secondary | ICD-10-CM | POA: Insufficient documentation

## 2015-04-30 DIAGNOSIS — Z8249 Family history of ischemic heart disease and other diseases of the circulatory system: Secondary | ICD-10-CM | POA: Diagnosis not present

## 2015-04-30 DIAGNOSIS — E1122 Type 2 diabetes mellitus with diabetic chronic kidney disease: Secondary | ICD-10-CM | POA: Insufficient documentation

## 2015-04-30 DIAGNOSIS — I10 Essential (primary) hypertension: Secondary | ICD-10-CM

## 2015-04-30 DIAGNOSIS — Z888 Allergy status to other drugs, medicaments and biological substances status: Secondary | ICD-10-CM | POA: Diagnosis not present

## 2015-04-30 DIAGNOSIS — N189 Chronic kidney disease, unspecified: Secondary | ICD-10-CM | POA: Diagnosis not present

## 2015-04-30 DIAGNOSIS — Z79899 Other long term (current) drug therapy: Secondary | ICD-10-CM | POA: Diagnosis not present

## 2015-04-30 DIAGNOSIS — N184 Chronic kidney disease, stage 4 (severe): Secondary | ICD-10-CM

## 2015-04-30 NOTE — Progress Notes (Addendum)
Subjective:    Patient ID: Jacqueline Rodriguez, female    DOB: [birth year], 55 y.o.   MRN: [id number]  Congestive Heart Failure Presents for follow-up visit. The disease course has been improving. Associated symptoms include fatigue. Pertinent negatives include no abdominal pain, chest pain, chest pressure, edema, orthopnea, palpitations or shortness of breath. The symptoms have been improving. Past treatments include beta blockers, salt and fluid restriction and oxygen. The treatment provided moderate relief. Compliance with prior treatments has been good. Her past medical history is significant for DM and HTN. There is no history of chronic lung disease or CVA. She has one 1st degree relative with heart disease.  Hypertension This is a chronic problem. The current episode started more than 1 year ago. The problem has been waxing and waning since onset. Pertinent negatives include no chest pain, headaches, palpitations, peripheral edema or shortness of breath. There are no associated agents to hypertension. Risk factors for coronary artery disease include family history and stress. Past treatments include beta blockers, diuretics and lifestyle changes. The current treatment provides mild improvement. Compliance problems include exercise.  Hypertensive end-organ damage includes heart failure.  Back Pain This is a new problem. The current episode started yesterday. The problem occurs constantly. The problem is unchanged. The pain is present in the lumbar spine. The quality of the pain is described as aching. The pain does not radiate. The pain is at a severity of 5/10. The pain is mild. The pain is the same all the time. The symptoms are aggravated by sitting. Stiffness is present all day. Pertinent negatives include no abdominal pain, chest pain, headaches, numbness, paresthesias or tingling. Risk factors include recent trauma. She has tried heat and ice for the symptoms. The treatment provided no relief.     Past Medical History  Diagnosis Date  . Hypertension   . Diabetes mellitus without complication ([place])   . CHF (congestive heart failure) ([place])   . Chronic kidney disease     History reviewed. No pertinent past surgical history.  Family History  Problem Relation Age of Onset  . Hypertension Mother   . Diabetes Mellitus II Mother   . CAD Father   . Hypertension Father   . Heart attack Father     Social History  Substance Use Topics  . Smoking status: Former Smoker    Types: Cigarettes    Quit date: 03/24/1985  . Smokeless tobacco: Never Used  . Alcohol Use: No    Allergies  Allergen Reactions  . Ambien [Zolpidem] Other (See Comments)    hallucinations    Prior to Admission medications   Medication Sig Start Date End Date Taking? Authorizing Provider  albuterol (PROVENTIL HFA;VENTOLIN HFA) 108 (90 Base) MCG/ACT inhaler Inhale 2 puffs into the lungs every 6 (six) hours as needed for wheezing or shortness of breath. 01/08/15  Yes Gregor Hams, MD  aspirin EC 81 MG EC tablet Take 1 tablet (81 mg total) by mouth daily. 03/20/15  Yes Vaughan Basta, MD  gabapentin (NEURONTIN) 100 MG capsule Take 100 mg by mouth 3 (three) times daily as needed. For neuropathy. 03/05/15  Yes Historical Provider, MD  glipiZIDE (GLUCOTROL) 5 MG tablet Take 5 mg by mouth 2 (two) times daily before a meal.   Yes Historical Provider, MD  metoprolol (LOPRESSOR) 50 MG tablet Take 1 tablet (50 mg total) by mouth 2 (two) times daily. 03/20/15  Yes Vaughan Basta, MD     Review of Systems  Constitutional: Positive for  fatigue. Negative for appetite change.  HENT: Positive for rhinorrhea. Negative for congestion and sore throat.   Eyes: Negative.   Respiratory: Positive for cough. Negative for chest tightness, shortness of breath and wheezing.   Cardiovascular: Negative for chest pain, palpitations and leg swelling.  Gastrointestinal: Negative for abdominal pain and abdominal  distention.  Endocrine: Negative.   Genitourinary: Negative.   Musculoskeletal: Positive for back pain and arthralgias (right knee).  Skin: Negative.   Allergic/Immunologic: Negative.   Neurological: Negative for dizziness, tingling, light-headedness, numbness, headaches and paresthesias.  Hematological: Negative for adenopathy. Does not bruise/bleed easily.  Psychiatric/Behavioral: Positive for sleep disturbance (trouble falling asleep). Negative for dysphoric mood. The patient is not nervous/anxious.        Objective:   Physical Exam  Constitutional: She is oriented to person, place, and time. She appears well-developed and well-nourished.  HENT:  Head: Normocephalic and atraumatic.  Eyes: Conjunctivae are normal. Pupils are equal, round, and reactive to light.  Neck: Normal range of motion. Neck supple.  Cardiovascular: Normal rate and regular rhythm.   Pulmonary/Chest: Effort normal. She has no wheezes. She has no rales.  Abdominal: Soft. She exhibits no distension. There is no tenderness.  Musculoskeletal: She exhibits no edema.       Lumbar back: She exhibits tenderness.  Neurological: She is alert and oriented to person, place, and time.  Skin: Skin is warm and dry.  Psychiatric: She has a normal mood and affect. Her behavior is normal. Thought content normal.  Nursing note and vitals reviewed.   BP 180/76 mmHg  Pulse 78  Resp 20  Ht 5\' 9"  (1.753 m)  Wt 214 lb (97.07 kg)  BMI 31.59 kg/m2  SpO2 100%       Assessment & Plan:  1: Chronic heart failure with preserved ejection fraction- Patient presents with fatigue upon exertion but she thinks it's more because she has trouble falling asleep at night. She denied feeling tired upon walking into the office. She continues to weigh herself and says that she's lost weight but is unable to say how much. By our scale, she has lost 9 pounds since she was last here on 03/25/15. Reminded her to call for an overnight weight gain of  >2 pounds or a weekly weight gain of >5 pounds. She is not adding salt to her food and uses [employer] seasoning and is draining her canned vegetables prior to using them. She does eat a "few" chips on occasion but allows for them by reducing her sodium consumption elsewhere. She does wear oxygen at 2L at bedtime. She would like to get established with a female cardiologist so an appointment was scheduled with Dr. Yvone Neu on 05/20/15. 2: HTN- Blood pressure is elevated even upon recheck. However, patient is talking nonstop and says that she's under a lot of stress. She's taking care of her mother who is incontinent and relies on her for "everything" and she's also taking care of her grandson. Encouraged her to take time out for herself and discussed adding something for her blood pressure but she doesn't want to take anything else at this time. She says that she'll follow up with her PCP regarding this and that she'll keep an eye on it. 3: Diabetes- She says that her glucose level this morning was 109. She has seen her PCP and has been started on glipizide and insulin (that she hasn't picked up yet). Have called her pharmacy to get names/dosages of her diabetes medication since she didn't  bring them. 4: Low back pain- She says that she slipped on a green bean and fell yesterday and now her back hurts along with her right knee. She's tried ice and heat but without much relief. Encouraged her to call her PCP for further treatment and evaluation.   Patient did not bring her medications nor a list. Each medication was verbally reviewed with the patient and she was encouraged to bring the bottles to every visit to confirm accuracy of list.  Return in 3 months or sooner for any questions/problems before then.

## 2015-04-30 NOTE — Patient Instructions (Addendum)
Continue weighing daily and call for an overnight weight gain of > 2 pounds or a weekly weight gain of >5 pounds.  Please follow up with:  Dr. Juleen China  [address] [place] [address] [phone] May 13, 2015 3:40 PM

## 2015-05-01 DIAGNOSIS — M549 Dorsalgia, unspecified: Secondary | ICD-10-CM | POA: Insufficient documentation

## 2015-05-01 NOTE — Addendum Note (Signed)
Addended by: Darylene Price A on: 05/01/2015 01:21 PM   Modules accepted: Orders, Medications

## 2015-05-02 ENCOUNTER — Ambulatory Visit: Payer: BLUE CROSS/BLUE SHIELD | Admitting: Family

## 2015-05-20 ENCOUNTER — Encounter: Payer: Self-pay | Admitting: *Deleted

## 2015-05-20 ENCOUNTER — Ambulatory Visit: Payer: BLUE CROSS/BLUE SHIELD | Admitting: Cardiology

## 2015-06-12 ENCOUNTER — Ambulatory Visit: Payer: BLUE CROSS/BLUE SHIELD | Admitting: Cardiology

## 2015-06-17 ENCOUNTER — Ambulatory Visit: Payer: BLUE CROSS/BLUE SHIELD | Admitting: Cardiology

## 2015-06-25 DIAGNOSIS — E1122 Type 2 diabetes mellitus with diabetic chronic kidney disease: Secondary | ICD-10-CM | POA: Diagnosis not present

## 2015-06-25 DIAGNOSIS — E875 Hyperkalemia: Secondary | ICD-10-CM | POA: Diagnosis not present

## 2015-06-25 DIAGNOSIS — R809 Proteinuria, unspecified: Secondary | ICD-10-CM | POA: Diagnosis not present

## 2015-06-25 DIAGNOSIS — N184 Chronic kidney disease, stage 4 (severe): Secondary | ICD-10-CM | POA: Diagnosis not present

## 2015-06-25 DIAGNOSIS — N2581 Secondary hyperparathyroidism of renal origin: Secondary | ICD-10-CM | POA: Diagnosis not present

## 2015-06-25 DIAGNOSIS — I129 Hypertensive chronic kidney disease with stage 1 through stage 4 chronic kidney disease, or unspecified chronic kidney disease: Secondary | ICD-10-CM | POA: Diagnosis not present

## 2015-07-01 ENCOUNTER — Ambulatory Visit: Payer: BLUE CROSS/BLUE SHIELD | Admitting: Cardiology

## 2015-07-17 ENCOUNTER — Encounter: Payer: Self-pay | Admitting: Emergency Medicine

## 2015-07-17 ENCOUNTER — Inpatient Hospital Stay
Admission: EM | Admit: 2015-07-17 | Discharge: 2015-07-24 | DRG: 291 | Disposition: A | Discharge status: Home / Self Care | Payer: Medicaid Other | Attending: Internal Medicine | Admitting: Internal Medicine

## 2015-07-17 ENCOUNTER — Emergency Department: Payer: Medicaid Other

## 2015-07-17 ENCOUNTER — Inpatient Hospital Stay: Payer: Medicaid Other

## 2015-07-17 DIAGNOSIS — Z7982 Long term (current) use of aspirin: Secondary | ICD-10-CM | POA: Diagnosis not present

## 2015-07-17 DIAGNOSIS — N2581 Secondary hyperparathyroidism of renal origin: Secondary | ICD-10-CM | POA: Diagnosis not present

## 2015-07-17 DIAGNOSIS — Z87891 Personal history of nicotine dependence: Secondary | ICD-10-CM | POA: Diagnosis not present

## 2015-07-17 DIAGNOSIS — I5033 Acute on chronic diastolic (congestive) heart failure: Secondary | ICD-10-CM | POA: Diagnosis present

## 2015-07-17 DIAGNOSIS — N185 Chronic kidney disease, stage 5: Secondary | ICD-10-CM | POA: Diagnosis not present

## 2015-07-17 DIAGNOSIS — N179 Acute kidney failure, unspecified: Secondary | ICD-10-CM | POA: Diagnosis present

## 2015-07-17 DIAGNOSIS — I10 Essential (primary) hypertension: Secondary | ICD-10-CM | POA: Diagnosis not present

## 2015-07-17 DIAGNOSIS — E1122 Type 2 diabetes mellitus with diabetic chronic kidney disease: Secondary | ICD-10-CM | POA: Diagnosis present

## 2015-07-17 DIAGNOSIS — R195 Other fecal abnormalities: Secondary | ICD-10-CM | POA: Diagnosis present

## 2015-07-17 DIAGNOSIS — E1140 Type 2 diabetes mellitus with diabetic neuropathy, unspecified: Secondary | ICD-10-CM | POA: Diagnosis present

## 2015-07-17 DIAGNOSIS — E1165 Type 2 diabetes mellitus with hyperglycemia: Secondary | ICD-10-CM | POA: Diagnosis present

## 2015-07-17 DIAGNOSIS — K644 Residual hemorrhoidal skin tags: Secondary | ICD-10-CM | POA: Diagnosis present

## 2015-07-17 DIAGNOSIS — Z6835 Body mass index (BMI) 35.0-35.9, adult: Secondary | ICD-10-CM | POA: Diagnosis not present

## 2015-07-17 DIAGNOSIS — I509 Heart failure, unspecified: Secondary | ICD-10-CM | POA: Diagnosis not present

## 2015-07-17 DIAGNOSIS — Z9981 Dependence on supplemental oxygen: Secondary | ICD-10-CM

## 2015-07-17 DIAGNOSIS — N189 Chronic kidney disease, unspecified: Secondary | ICD-10-CM | POA: Insufficient documentation

## 2015-07-17 DIAGNOSIS — E1129 Type 2 diabetes mellitus with other diabetic kidney complication: Secondary | ICD-10-CM | POA: Diagnosis present

## 2015-07-17 DIAGNOSIS — J9601 Acute respiratory failure with hypoxia: Secondary | ICD-10-CM | POA: Diagnosis present

## 2015-07-17 DIAGNOSIS — D631 Anemia in chronic kidney disease: Secondary | ICD-10-CM | POA: Diagnosis not present

## 2015-07-17 DIAGNOSIS — J810 Acute pulmonary edema: Secondary | ICD-10-CM | POA: Diagnosis not present

## 2015-07-17 DIAGNOSIS — N183 Chronic kidney disease, stage 3 unspecified: Secondary | ICD-10-CM | POA: Diagnosis present

## 2015-07-17 DIAGNOSIS — Z7984 Long term (current) use of oral hypoglycemic drugs: Secondary | ICD-10-CM | POA: Diagnosis not present

## 2015-07-17 DIAGNOSIS — J449 Chronic obstructive pulmonary disease, unspecified: Secondary | ICD-10-CM | POA: Diagnosis present

## 2015-07-17 DIAGNOSIS — N186 End stage renal disease: Secondary | ICD-10-CM | POA: Diagnosis not present

## 2015-07-17 DIAGNOSIS — I272 Other secondary pulmonary hypertension: Secondary | ICD-10-CM | POA: Diagnosis present

## 2015-07-17 DIAGNOSIS — R0602 Shortness of breath: Secondary | ICD-10-CM | POA: Insufficient documentation

## 2015-07-17 DIAGNOSIS — I120 Hypertensive chronic kidney disease with stage 5 chronic kidney disease or end stage renal disease: Secondary | ICD-10-CM | POA: Diagnosis not present

## 2015-07-17 DIAGNOSIS — I132 Hypertensive heart and chronic kidney disease with heart failure and with stage 5 chronic kidney disease, or end stage renal disease: Secondary | ICD-10-CM | POA: Diagnosis present

## 2015-07-17 DIAGNOSIS — I129 Hypertensive chronic kidney disease with stage 1 through stage 4 chronic kidney disease, or unspecified chronic kidney disease: Secondary | ICD-10-CM | POA: Diagnosis not present

## 2015-07-17 DIAGNOSIS — E11319 Type 2 diabetes mellitus with unspecified diabetic retinopathy without macular edema: Secondary | ICD-10-CM | POA: Diagnosis present

## 2015-07-17 DIAGNOSIS — N184 Chronic kidney disease, stage 4 (severe): Secondary | ICD-10-CM | POA: Diagnosis not present

## 2015-07-17 DIAGNOSIS — E669 Obesity, unspecified: Secondary | ICD-10-CM | POA: Diagnosis present

## 2015-07-17 HISTORY — DX: Chronic kidney disease, stage 3 unspecified: N18.30

## 2015-07-17 HISTORY — DX: Obesity, unspecified: E66.9

## 2015-07-17 HISTORY — DX: Pulmonary hypertension, unspecified: I27.20

## 2015-07-17 HISTORY — DX: Essential (primary) hypertension: I10

## 2015-07-17 HISTORY — DX: Unspecified chronic bronchitis: J42

## 2015-07-17 HISTORY — DX: Chronic kidney disease, stage 3 (moderate): N18.3

## 2015-07-17 HISTORY — DX: Type 2 diabetes mellitus with other diabetic kidney complication: E11.29

## 2015-07-17 HISTORY — DX: Chronic diastolic (congestive) heart failure: I50.32

## 2015-07-17 LAB — COMPREHENSIVE METABOLIC PANEL
ALBUMIN: 3 g/dL — AB (ref 3.5–5.0)
ALK PHOS: 83 U/L (ref 38–126)
ALT: 19 U/L (ref 14–54)
ANION GAP: 9 (ref 5–15)
AST: 20 U/L (ref 15–41)
BILIRUBIN TOTAL: 0.6 mg/dL (ref 0.3–1.2)
BUN: 52 mg/dL — ABNORMAL HIGH (ref 6–20)
CHLORIDE: 110 mmol/L (ref 101–111)
CO2: 20 mmol/L — AB (ref 22–32)
CREATININE: 4.64 mg/dL — AB (ref 0.44–1.00)
Calcium: 8.3 mg/dL — ABNORMAL LOW (ref 8.9–10.3)
GFR, EST AFRICAN AMERICAN: 11 mL/min — AB (ref >60)
GFR, EST NON AFRICAN AMERICAN: 10 mL/min — AB (ref >60)
GLUCOSE: 159 mg/dL — AB (ref 65–99)
Potassium: 4.9 mmol/L (ref 3.5–5.1)
Sodium: 139 mmol/L (ref 135–145)
Total Protein: 7.1 g/dL (ref 6.5–8.1)

## 2015-07-17 LAB — GLUCOSE, CAPILLARY
Glucose-Capillary: 178 mg/dL — ABNORMAL HIGH (ref 65–99)
Glucose-Capillary: 151 mg/dL — ABNORMAL HIGH (ref 65–99)

## 2015-07-17 LAB — CBC
HEMATOCRIT: 27.4 % — AB (ref 35.0–47.0)
HEMOGLOBIN: 9 g/dL — AB (ref 12.0–16.0)
MCH: 27.2 pg (ref 26.0–34.0)
MCHC: 32.9 g/dL (ref 32.0–36.0)
MCV: 82.8 fL (ref 80.0–100.0)
Platelets: 192 10*3/uL (ref 150–440)
RBC: 3.31 MIL/uL — ABNORMAL LOW (ref 3.80–5.20)
RDW: 16.6 % — AB (ref 11.5–14.5)
WBC: 8.8 10*3/uL (ref 3.6–11.0)

## 2015-07-17 LAB — TROPONIN I: Troponin I: 0.03 ng/mL (ref <0.03)

## 2015-07-17 LAB — BRAIN NATRIURETIC PEPTIDE: B NATRIURETIC PEPTIDE 5: 261 pg/mL — AB (ref 0.0–100.0)

## 2015-07-17 MED ORDER — DOCUSATE SODIUM 100 MG PO CAPS
100.0000 mg | ORAL_CAPSULE | Freq: Two times a day (BID) | ORAL | Status: DC
  Administered 2015-07-18 – 2015-07-24 (×6): 100 mg via ORAL
  Filled 2015-07-17 (×11): qty 1

## 2015-07-17 MED ORDER — ONDANSETRON HCL 4 MG PO TABS
4.0000 mg | ORAL_TABLET | Freq: Four times a day (QID) | ORAL | Status: DC | PRN
Start: 2015-07-17 — End: 2015-07-24
  Filled 2015-07-17: qty 1

## 2015-07-17 MED ORDER — GLIPIZIDE 5 MG PO TABS
5.0000 mg | ORAL_TABLET | Freq: Every day | ORAL | Status: DC
  Administered 2015-07-18 – 2015-07-24 (×5): 5 mg via ORAL
  Filled 2015-07-17 (×5): qty 1

## 2015-07-17 MED ORDER — ACETAMINOPHEN 325 MG PO TABS
650.0000 mg | ORAL_TABLET | Freq: Four times a day (QID) | ORAL | Status: DC | PRN
  Administered 2015-07-17 – 2015-07-24 (×15): 650 mg via ORAL
  Filled 2015-07-17 (×16): qty 2

## 2015-07-17 MED ORDER — ONDANSETRON HCL 4 MG/2ML IJ SOLN
4.0000 mg | Freq: Four times a day (QID) | INTRAMUSCULAR | Status: DC | PRN
  Administered 2015-07-22 – 2015-07-23 (×2): 4 mg via INTRAVENOUS
  Filled 2015-07-17 (×2): qty 2

## 2015-07-17 MED ORDER — ALBUTEROL SULFATE (2.5 MG/3ML) 0.083% IN NEBU: 2.5000 mg | INHALATION_SOLUTION | Freq: Four times a day (QID) | RESPIRATORY_TRACT | Status: DC | PRN

## 2015-07-17 MED ORDER — HYDRALAZINE HCL 20 MG/ML IJ SOLN
20.0000 mg | Freq: Four times a day (QID) | INTRAMUSCULAR | Status: DC | PRN
  Administered 2015-07-17 – 2015-07-20 (×5): 20 mg via INTRAVENOUS
  Filled 2015-07-17 (×5): qty 1

## 2015-07-17 MED ORDER — INSULIN ASPART 100 UNIT/ML ~~LOC~~ SOLN
0.0000 [IU] | Freq: Three times a day (TID) | SUBCUTANEOUS | Status: DC
  Administered 2015-07-17: 3 [IU] via SUBCUTANEOUS
  Administered 2015-07-18: 2 [IU] via SUBCUTANEOUS
  Administered 2015-07-18 – 2015-07-22 (×4): 3 [IU] via SUBCUTANEOUS
  Filled 2015-07-17 (×5): qty 3
  Filled 2015-07-17: qty 1
  Filled 2015-07-17: qty 3

## 2015-07-17 MED ORDER — INSULIN ASPART 100 UNIT/ML ~~LOC~~ SOLN
3.0000 [IU] | Freq: Three times a day (TID) | SUBCUTANEOUS | Status: DC
  Administered 2015-07-17 – 2015-07-19 (×4): 3 [IU] via SUBCUTANEOUS
  Filled 2015-07-17 (×4): qty 3

## 2015-07-17 MED ORDER — ASPIRIN EC 81 MG PO TBEC
81.0000 mg | DELAYED_RELEASE_TABLET | Freq: Every day | ORAL | Status: DC
  Administered 2015-07-17 – 2015-07-19 (×3): 81 mg via ORAL
  Filled 2015-07-17 (×3): qty 1

## 2015-07-17 MED ORDER — FUROSEMIDE 10 MG/ML IJ SOLN
40.0000 mg | Freq: Once | INTRAMUSCULAR | Status: AC
  Administered 2015-07-17: 40 mg via INTRAVENOUS
  Filled 2015-07-17: qty 4

## 2015-07-17 MED ORDER — FUROSEMIDE 10 MG/ML IJ SOLN
40.0000 mg | Freq: Two times a day (BID) | INTRAMUSCULAR | Status: DC
  Administered 2015-07-17 – 2015-07-18 (×2): 40 mg via INTRAVENOUS
  Filled 2015-07-17 (×2): qty 4

## 2015-07-17 MED ORDER — GABAPENTIN 100 MG PO CAPS
100.0000 mg | ORAL_CAPSULE | Freq: Two times a day (BID) | ORAL | Status: DC
  Administered 2015-07-17 – 2015-07-24 (×13): 100 mg via ORAL
  Filled 2015-07-17 (×13): qty 1

## 2015-07-17 MED ORDER — ACETAMINOPHEN 650 MG RE SUPP: 650.0000 mg | Freq: Four times a day (QID) | RECTAL | Status: DC | PRN

## 2015-07-17 MED ORDER — ALBUTEROL SULFATE HFA 108 (90 BASE) MCG/ACT IN AERS: 2.0000 | INHALATION_SPRAY | Freq: Four times a day (QID) | RESPIRATORY_TRACT | Status: DC | PRN

## 2015-07-17 MED ORDER — ENOXAPARIN SODIUM 40 MG/0.4ML ~~LOC~~ SOLN
30.0000 mg | SUBCUTANEOUS | Status: DC
Start: 2015-07-17 — End: 2015-07-18
  Administered 2015-07-17: 30 mg via SUBCUTANEOUS
  Filled 2015-07-17: qty 0.4

## 2015-07-17 NOTE — Progress Notes (Signed)
Admitted with increased shortness of breath,and leg swellings,ambulatory,alert and oriented,open blisters on right foot,skin tear on right hand,bruises and echymosis.

## 2015-07-17 NOTE — ED Notes (Signed)
Patient presents to the ED with increased shortness of breath.  Patient has history of congestive heart failure.  Patient states she uses oxygen at night but last night even with the oxygen she was still having difficulty breathing.  Patient is speaking in full sentences at this time in triage.  Patient denies chest pain.  Patient states, "I've been sick so much, I can tell when I'm getting sick and I'm getting sick."

## 2015-07-17 NOTE — Progress Notes (Signed)
Agree with current treatment. Probably needs dialysis.Will get echo, and full consult to follow.

## 2015-07-17 NOTE — H&P (Signed)
[facility] NAME: Jacqueline Rodriguez    MR#:  [id number]  DATE OF BIRTH:  1960/01/24  DATE OF ADMISSION:  07/17/2015  PRIMARY CARE PHYSICIAN: Christie Nottingham., PA   REQUESTING/REFERRING PHYSICIAN: Dr. Rudene Re  CHIEF COMPLAINT: Shortness of breath    Chief Complaint  Patient presents with  . Shortness of Breath    HISTORY OF PRESENT ILLNESS:  Jacqueline Rodriguez  is a 55 y.o. female with a known history ofEssential hypertension, diabetes mellitus type 2, chronic kidney disease stage III comes in because of shortness of breath for 2 days, lower extremity  edema getting worse progressively for last few days. Patient does have orthopnea, PND. No chest pain, patient has some cough with clear phlegm. Found to have acute on chronic diastolic heart failure, worsening renal failure with creatinine of 4.64. The NP 261. Saturations are 91% on room air. She uses oxygen at night but now she is using the daytime also.  PAST MEDICAL HISTORY:   Past Medical History  Diagnosis Date  . Hypertension   . Diabetes mellitus without complication ([place])   . CHF (congestive heart failure) ([place])   . Chronic kidney disease   . Chronic bronchitis ([place])     PAST SURGICAL HISTOIRY:  History reviewed. No pertinent past surgical history.  SOCIAL HISTORY:   Social History  Substance Use Topics  . Smoking status: Former Smoker    Types: Cigarettes    Quit date: 03/24/1985  . Smokeless tobacco: Never Used  . Alcohol Use: No    FAMILY HISTORY:   Family History  Problem Relation Age of Onset  . Hypertension Mother   . Diabetes Mellitus II Mother   . CAD Father   . Hypertension Father   . Heart attack Father     DRUG ALLERGIES:   Allergies  Allergen Reactions  . Ambien [Zolpidem] Other (See Comments)    hallucinations    REVIEW OF SYSTEMS:  CONSTITUTIONAL: No fever, fatigue or weakness.  EYES: No blurred or double vision.  EARS, NOSE,  AND THROAT: No tinnitus or ear pain.  RESPIRATORY: No cough, shortness of breath, wheezing or hemoptysis.  CARDIOVASCULAR: No chest pain, orthopnea, edema.  GASTROINTESTINAL: No nausea, vomiting, diarrhea or abdominal pain.  GENITOURINARY: No dysuria, hematuria.  ENDOCRINE: No polyuria, nocturia,  HEMATOLOGY: No anemia, easy bruising or bleeding SKIN: No rash or lesion. MUSCULOSKELETAL: No joint pain or arthritis.   NEUROLOGIC: No tingling, numbness, weakness.  PSYCHIATRY: No anxiety or depression.   MEDICATIONS AT HOME:   Prior to Admission medications   Medication Sig Start Date End Date Taking? Authorizing Provider  albuterol (PROVENTIL HFA;VENTOLIN HFA) 108 (90 Base) MCG/ACT inhaler Inhale 2 puffs into the lungs every 6 (six) hours as needed for wheezing or shortness of breath. 01/08/15  Yes Gregor Hams, MD  aspirin EC 81 MG EC tablet Take 1 tablet (81 mg total) by mouth daily. 03/20/15  Yes Vaughan Basta, MD  gabapentin (NEURONTIN) 100 MG capsule Take 100 mg by mouth 3 (three) times daily as needed. For neuropathy. 03/05/15  Yes Historical Provider, MD  glipiZIDE (GLUCOTROL) 5 MG tablet Take 5 mg by mouth daily before breakfast.    Yes Historical Provider, MD  lisinopril (PRINIVIL,ZESTRIL) 5 MG tablet Take 5 mg by mouth daily.   Yes Historical Provider, MD      VITAL SIGNS:  Blood pressure 199/84, pulse 84, temperature 97.8 F (36.6 C), temperature source Oral, resp. rate  24, height 5\' 9"  (1.753 m), weight 99.791 kg (220 lb), SpO2 91 %.  PHYSICAL EXAMINATION:  GENERAL:  55 y.o.-year-old patient lying in the bed with no acute distress.  EYES: Pupils equal, round, reactive to light and accommodation. No scleral icterus. Extraocular muscles intact.  HEENT: Head atraumatic, normocephalic. Oropharynx and nasopharynx clear.  NECK:  Supple, no jugular venous distention. No thyroid enlargement, no tenderness.  LUNGS: Decreased breath sounds bilaterally with basilar  crepitations.  CARDIOVASCULAR: S1, S2 normal. No murmurs, rubs, or gallops.  ABDOMEN: Soft, nontender, nondistended. Bowel sounds present. No organomegaly or mass.  EXTREMITIES: 3+ pitting edema present bilaterally, patient has fluid-filled blister on the dorsum of right foot.  NEUROLOGIC: Cranial nerves II through XII are intact. Muscle strength 5/5 in all extremities. Sensation intact. Gait not checked.  PSYCHIATRIC: The patient is alert and oriented x 3.  SKIN: No obvious rash, lesion, or ulcer.   LABORATORY PANEL:   CBC  Recent Labs Lab 07/17/15 1129  WBC 8.8  HGB 9.0*  HCT 27.4*  PLT 192   ------------------------------------------------------------------------------------------------------------------  Chemistries   Recent Labs Lab 07/17/15 1129  NA 139  K 4.9  CL 110  CO2 20*  GLUCOSE 159*  BUN 52*  CREATININE 4.64*  CALCIUM 8.3*  AST 20  ALT 19  ALKPHOS 83  BILITOT 0.6   ------------------------------------------------------------------------------------------------------------------  Cardiac Enzymes  Recent Labs Lab 07/17/15 1129  TROPONINI 0.03*   ------------------------------------------------------------------------------------------------------------------  RADIOLOGY:  Dg Chest 2 View  07/17/2015  CLINICAL DATA:  Shortness of breath EXAM: CHEST  2 VIEW COMPARISON:  03/15/2015 FINDINGS: Chronic cardiopericardial enlargement. Small pleural effusions including fissural fluid seen laterally. Diffuse septal thickening with Kerley lines. IMPRESSION: CHF. Electronically Signed   By: Monte Fantasia M.D.   On: 07/17/2015 10:37    EKG:   Orders placed or performed during the hospital encounter of 07/17/15  . ED EKG  . ED EKG  . EKG 12-Lead  . EKG 12-Lead  Normal sinus rhythm at 82 bpm no ST T changes..  IMPRESSION AND PLAN:  #59. 55 year old female patient with orthopnea PND, pedal edema with elevated BNP, hypoxia secondary to acute on chronic  diastolic heart failure: Continue monitoring on telemetry, admitted to telemetry, started on IV Lasix 40 mg every 12 hours, check daily weights, salt restricted diet. Monitor urine output. Consulted cardiology. Previous echo in March of this year showed EF more than 50%. Acute respiratory failure with hypoxia secondary to CHF exacerbation: Started on IV Lasix, monitor on telemetry. #3 acute on chronic renal failure with CK distress 3: Patient to baseline creatinine 2.65,  Today it is  is 4.64. Nephrology on call Dr. Holley Raring is consulted. He recommended to continue Lasix 40 mg twice a day, he'll see the patient. I'll obtain a renal ultrasound. Check daily  BMP. Discussed this with patient and the patient's husband.  #4 diabetes mellitus type 2: Continue glipizide, add sliding scale with coverage. #5 diabetes neuropathy: Continue Neurontin. #6 malignant hypertension Mrs. temperature more than 200s: Patient is on Lasix, hydralazine.  Add Metoprolol tartrate 50 mg by mouth twice a day. All the records are reviewed and case discussed with ED provider. Management plans discussed with the patient, family and they are in agreement.  CODE STATUS: full  TOTAL TIME TAKING CARE OF THIS PATIENT:  66minutes.    Epifanio Lesches M.D on 07/17/2015 at 12:46 PM  Between 7am to 6pm - Pager - [phone]  After 6pm go to [email or web address] - password [email or web address] [facility]  [facility]  Office  [phone]  CC: Primary care physician; Christie Nottingham., PA  Note: This dictation was prepared with Dragon dictation along with smaller phrase technology. Any transcriptional errors that result from this process are unintentional.

## 2015-07-17 NOTE — ED Provider Notes (Signed)
[facility] Emergency Department Provider Note  ____________________________________________  Time seen: Approximately 11:42 AM  I have reviewed the triage vital signs and the nursing notes.   HISTORY  Chief Complaint Shortness of Breath   HPI Jacqueline Rodriguez is a 55 y.o. female history of CHF (last echo March 2017 with an EF of 50%), diabetes, hypertension, CKD, and COPD presents for evaluation of shortness of breath. Patient reports that she's been getting progressively more short of breath over the course of 2 weeks. Yesterday night she reports that she was unable to sleep due to severe shortness of breath. She uses oxygen at night time and even with oxygen she was unable to breathe which prompted her visit to the emergency department. She does not know how many liters she uses oxygen at home but does not use oxygen during the day. SOB associated with swelling of her lower extremities and orthopnea. She denies weight gain. She has been off her Lasix since March when her echo was normal. She denies cough, chest pain, fever, nausea, vomiting.   Past Medical History  Diagnosis Date  . Hypertension   . Diabetes mellitus without complication ([place])   . CHF (congestive heart failure) ([place])   . Chronic kidney disease   . Chronic bronchitis [facility])     Patient Active Problem List   Diagnosis Date Noted  . Back pain 05/01/2015  . Essential hypertension 03/25/2015  . Diabetes ([place]) 03/25/2015  . CHF (congestive heart failure) ([place]) 03/15/2015  . Congestive heart disease ([place]) 03/15/2015    History reviewed. No pertinent past surgical history.  Current Outpatient Rx  Name  Route  Sig  Dispense  Refill  . albuterol (PROVENTIL HFA;VENTOLIN HFA) 108 (90 Base) MCG/ACT inhaler   Inhalation   Inhale 2 puffs into the lungs every 6 (six) hours as needed for wheezing or shortness of breath.   1 Inhaler   2   . aspirin EC 81 MG EC tablet   Oral   Take 1 tablet (81 mg  total) by mouth daily.   30 tablet   0   . gabapentin (NEURONTIN) 100 MG capsule   Oral   Take 100 mg by mouth 3 (three) times daily as needed. For neuropathy.      2   . glipiZIDE (GLUCOTROL) 5 MG tablet   Oral   Take 5 mg by mouth daily before breakfast.          . lisinopril (PRINIVIL,ZESTRIL) 5 MG tablet   Oral   Take 5 mg by mouth daily.           Allergies Ambien  Family History  Problem Relation Age of Onset  . Hypertension Mother   . Diabetes Mellitus II Mother   . CAD Father   . Hypertension Father   . Heart attack Father     Social History Social History  Substance Use Topics  . Smoking status: Former Smoker    Types: Cigarettes    Quit date: 03/24/1985  . Smokeless tobacco: Never Used  . Alcohol Use: No    Review of Systems  Constitutional: Negative for fever. Eyes: Negative for visual changes. ENT: Negative for sore throat. Cardiovascular: Negative for chest pain. Respiratory: + shortness of breath and orthopnea Gastrointestinal: Negative for abdominal pain, vomiting or diarrhea. Genitourinary: Negative for dysuria. Musculoskeletal: Negative for back pain. Skin: Negative for rash. Neurological: Negative for headaches, weakness or numbness.  ____________________________________________   PHYSICAL EXAM:  VITAL SIGNS: ED  Triage Vitals  Enc Vitals Group     BP 07/17/15 1014 199/84 mmHg     Pulse Rate 07/17/15 1014 84     Resp 07/17/15 1014 24     Temp 07/17/15 1014 97.8 F (36.6 C)     Temp Source 07/17/15 1014 Oral     SpO2 07/17/15 1014 92 %     Weight 07/17/15 1014 220 lb (99.791 kg)     Height 07/17/15 1014 5\' 9"  (1.753 m)     Head Cir --      Peak Flow --      Pain Score 07/17/15 1015 0     Pain Loc --      Pain Edu? --      Excl. in [place]? --     Constitutional: Alert and oriented. Well appearing and in no apparent distress. HEENT:      Head: Normocephalic and atraumatic.         Eyes: Conjunctivae are normal. Sclera is  non-icteric. EOMI. PERRL      Mouth/Throat: Mucous membranes are moist.       Neck: Supple with no signs of meningismus. Cardiovascular: Regular rate and rhythm. No murmurs, gallops, or rubs. 2+ symmetrical distal pulses are present in all extremities. Elevated JVD to ear lobe Respiratory: Increased work of breathing, tachypnea to 24, satting 92% on room air, no crackles, diminished air sounds on basis, no wheezing.  Gastrointestinal: Soft, non tender, and non distended with positive bowel sounds. No rebound or guarding. Musculoskeletal: Pitting edema to bilateral thighs.  Neurologic: Normal speech and language. Face is symmetric. Moving all extremities. No gross focal neurologic deficits are appreciated. Skin: Skin is warm, dry and intact. No rash noted. Psychiatric: Mood and affect are normal. Speech and behavior are normal.  ____________________________________________   LABS (all labs ordered are listed, but only abnormal results are displayed)  Labs Reviewed  CBC - Abnormal; Notable for the following:    RBC 3.31 (*)    Hemoglobin 9.0 (*)    HCT 27.4 (*)    RDW 16.6 (*)    All other components within normal limits  COMPREHENSIVE METABOLIC PANEL - Abnormal; Notable for the following:    CO2 20 (*)    Glucose, Bld 159 (*)    BUN 52 (*)    Creatinine, Ser 4.64 (*)    Calcium 8.3 (*)    Albumin 3.0 (*)    GFR calc non Af Amer 10 (*)    GFR calc Af Amer 11 (*)    All other components within normal limits  TROPONIN I - Abnormal; Notable for the following:    Troponin I 0.03 (*)    All other components within normal limits  BRAIN NATRIURETIC PEPTIDE - Abnormal; Notable for the following:    B Natriuretic Peptide 261.0 (*)    All other components within normal limits   ____________________________________________  EKG  ED ECG REPORT I, Rudene Re, the attending physician, personally viewed and interpreted this ECG.  Normal sinus rhythm, rate of 82, normal intervals,  left axis deviation, T-wave inversions in 1 and aVL, no ST elevations or depressions. Unchanged from prior. ____________________________________________  RADIOLOGY  CXR: Cardiomegaly, pulmonary edema, , small bilateral pleural effusions  ____________________________________________   PROCEDURES  Procedure(s) performed: None Critical Care performed:  None ____________________________________________   INITIAL IMPRESSION / ASSESSMENT AND PLAN / ED COURSE   55 y.o. female history of CHF (last echo March 2017 with an EF of 50%), diabetes, hypertension,  CKD, and COPD presents for evaluation of shortness of breath, orthopnea, and bilateral pitting edema. Patient has been off of her Lasix since March. Patient looks volume overloaded on exam. EKG is unchanged from prior. Chest x-ray concerning for CHF. Labs pending. Plan to treat with IV Lasix and admitted to hospital.  ----------------------------------------- 12:24 PM on 07/17/2015 -----------------------------------------  Patient with worsening kidney function. Creatinine of 4.64 (baseline is 2.5-2.8). Chest x-ray concerning for CHF. Troponin slightly elevated however patient has a history of elevated troponin which I believe is due to her kidney function. Her BNP is elevated at 261. We'll give her 40 of IV Lasix. I discussed with the hospitalist will accept the patient for admission.   Pertinent labs & imaging results that were available during my care of the patient were reviewed by me and considered in my medical decision making (see chart for details).    ____________________________________________   FINAL CLINICAL IMPRESSION(S) / ED DIAGNOSES  Final diagnoses:  Acute on chronic congestive heart failure, unspecified congestive heart failure type ([place])  AKI (acute kidney injury) ([place])      NEW MEDICATIONS STARTED DURING THIS VISIT:  New Prescriptions   No medications on file     Note:  This document was prepared  using Dragon voice recognition software and may include unintentional dictation errors.    Rudene Re, MD 07/17/15 1225

## 2015-07-18 ENCOUNTER — Encounter: Payer: Self-pay | Admitting: Physician Assistant

## 2015-07-18 ENCOUNTER — Inpatient Hospital Stay
Admit: 2015-07-18 | Discharge: 2015-07-18 | Disposition: A | Discharge status: Home / Self Care | Payer: Medicaid Other | Attending: Cardiovascular Disease | Admitting: Cardiovascular Disease

## 2015-07-18 DIAGNOSIS — N183 Chronic kidney disease, stage 3 unspecified: Secondary | ICD-10-CM | POA: Insufficient documentation

## 2015-07-18 DIAGNOSIS — D631 Anemia in chronic kidney disease: Secondary | ICD-10-CM | POA: Insufficient documentation

## 2015-07-18 DIAGNOSIS — N179 Acute kidney failure, unspecified: Secondary | ICD-10-CM

## 2015-07-18 DIAGNOSIS — E669 Obesity, unspecified: Secondary | ICD-10-CM | POA: Diagnosis present

## 2015-07-18 DIAGNOSIS — I272 Other secondary pulmonary hypertension: Secondary | ICD-10-CM

## 2015-07-18 DIAGNOSIS — R0602 Shortness of breath: Secondary | ICD-10-CM | POA: Insufficient documentation

## 2015-07-18 DIAGNOSIS — N184 Chronic kidney disease, stage 4 (severe): Secondary | ICD-10-CM

## 2015-07-18 DIAGNOSIS — E1129 Type 2 diabetes mellitus with other diabetic kidney complication: Secondary | ICD-10-CM | POA: Diagnosis present

## 2015-07-18 DIAGNOSIS — N189 Chronic kidney disease, unspecified: Secondary | ICD-10-CM

## 2015-07-18 DIAGNOSIS — I5033 Acute on chronic diastolic (congestive) heart failure: Secondary | ICD-10-CM

## 2015-07-18 DIAGNOSIS — E1122 Type 2 diabetes mellitus with diabetic chronic kidney disease: Secondary | ICD-10-CM

## 2015-07-18 LAB — BASIC METABOLIC PANEL
ANION GAP: 7 (ref 5–15)
BUN: 53 mg/dL — ABNORMAL HIGH (ref 6–20)
CHLORIDE: 110 mmol/L (ref 101–111)
CO2: 20 mmol/L — AB (ref 22–32)
CREATININE: 4.78 mg/dL — AB (ref 0.44–1.00)
Calcium: 7.9 mg/dL — ABNORMAL LOW (ref 8.9–10.3)
GFR calc non Af Amer: 9 mL/min — ABNORMAL LOW (ref >60)
GFR, EST AFRICAN AMERICAN: 11 mL/min — AB (ref >60)
GLUCOSE: 154 mg/dL — AB (ref 65–99)
Potassium: 4.8 mmol/L (ref 3.5–5.1)
Sodium: 137 mmol/L (ref 135–145)

## 2015-07-18 LAB — ECHOCARDIOGRAM COMPLETE
Height: 69 in
Weight: 3859.2 oz

## 2015-07-18 LAB — CBC
HCT: 24.8 % — ABNORMAL LOW (ref 35.0–47.0)
HEMOGLOBIN: 8.5 g/dL — AB (ref 12.0–16.0)
MCH: 28.2 pg (ref 26.0–34.0)
MCHC: 34.2 g/dL (ref 32.0–36.0)
MCV: 82.4 fL (ref 80.0–100.0)
Platelets: 168 10*3/uL (ref 150–440)
RBC: 3.01 MIL/uL — AB (ref 3.80–5.20)
RDW: 16 % — ABNORMAL HIGH (ref 11.5–14.5)
WBC: 7.9 10*3/uL (ref 3.6–11.0)

## 2015-07-18 LAB — GLUCOSE, CAPILLARY
GLUCOSE-CAPILLARY: 165 mg/dL — AB (ref 65–99)
GLUCOSE-CAPILLARY: 139 mg/dL — AB (ref 65–99)
GLUCOSE-CAPILLARY: 86 mg/dL (ref 65–99)
GLUCOSE-CAPILLARY: 153 mg/dL — AB (ref 65–99)
Glucose-Capillary: 63 mg/dL — ABNORMAL LOW (ref 65–99)

## 2015-07-18 MED ORDER — FUROSEMIDE 10 MG/ML IJ SOLN
8.0000 mg/h | INTRAVENOUS | Status: DC
  Administered 2015-07-18 – 2015-07-22 (×4): 8 mg/h via INTRAVENOUS
  Filled 2015-07-18 (×4): qty 25

## 2015-07-18 MED ORDER — HEPARIN SODIUM (PORCINE) 5000 UNIT/ML IJ SOLN
5000.0000 [IU] | Freq: Three times a day (TID) | INTRAMUSCULAR | Status: DC
  Administered 2015-07-18 – 2015-07-19 (×4): 5000 [IU] via SUBCUTANEOUS
  Filled 2015-07-18 (×4): qty 1

## 2015-07-18 MED ORDER — DIPHENHYDRAMINE HCL 25 MG PO CAPS
25.0000 mg | ORAL_CAPSULE | Freq: Every evening | ORAL | Status: DC | PRN
  Administered 2015-07-18 – 2015-07-24 (×7): 25 mg via ORAL
  Filled 2015-07-18 (×7): qty 1

## 2015-07-18 NOTE — Progress Notes (Signed)
[facility]  ROUNDING NOTE   Subjective:  atient well-known to [place]. We last saw her at the end of June in the office. At that point in time EGFR had dropped to 12. She presents now with worsening lower extremity edema.She also has shortness of breath. We have started her on a Lasix drip.She may end up requiring renal replacement therapy however.   Objective:  Vital signs in last 24 hours:  Temp:  [97.6 F (36.4 C)-97.8 F (36.6 C)] 97.6 F (36.4 C) (07/07 1139) Pulse Rate:  [78-82] 78 (07/07 1454) Resp:  [18] 18 (07/07 1139) BP: (130-177)/(64-74) 163/74 mmHg (07/07 1454) SpO2:  [93 %-100 %] 93 % (07/07 1139) Weight:  [109.408 kg (241 lb 3.2 oz)] 109.408 kg (241 lb 3.2 oz) (07/07 0413)  Weight change:  Filed Weights   07/17/15 1014 07/18/15 0413  Weight: 99.791 kg (220 lb) 109.408 kg (241 lb 3.2 oz)    Intake/Output: I/O last 3 completed shifts: In: 240 [P.O.:240] Out: 1150 [Urine:1150]   Intake/Output this shift:  Total I/O In: 480 [P.O.:480] Out: 400 [Urine:400]  Physical Exam: General: NAD, laying in bed  Head: Normocephalic, atraumatic. Moist oral mucosal membranes  Eyes: Anicteric  Neck: Supple, trachea midline  Lungs:  Basilar rales, normal effort  Heart: Regular rate and rhythm,no obvious rub  Abdomen:  Soft, nontender, BS present  Extremities: 2+ peripheral edema.  Neurologic: Nonfocal, moving all four extremities  Skin: No lesions       Basic Metabolic Panel:  Recent Labs Lab 07/17/15 1129 07/18/15 0503  NA 139 137  K 4.9 4.8  CL 110 110  CO2 20* 20*  GLUCOSE 159* 154*  BUN 52* 53*  CREATININE 4.64* 4.78*  CALCIUM 8.3* 7.9*    Liver Function Tests:  Recent Labs Lab 07/17/15 1129  AST 20  ALT 19  ALKPHOS 83  BILITOT 0.6  PROT 7.1  ALBUMIN 3.0*   No results for input(s): LIPASE, AMYLASE in the last 168 hours. No results for input(s): AMMONIA in the last 168 hours.  CBC:  Recent Labs Lab 07/17/15 1129  07/18/15 0503  WBC 8.8 7.9  HGB 9.0* 8.5*  HCT 27.4* 24.8*  MCV 82.8 82.4  PLT 192 168    Cardiac Enzymes:  Recent Labs Lab 07/17/15 1129  TROPONINI 0.03*    BNP: Invalid input(s): POCBNP  CBG:  Recent Labs Lab 07/17/15 1635 07/17/15 2108 07/18/15 0730 07/18/15 1141  GLUCAP 178* 151* 165* 139*    Microbiology: Results for orders placed or performed during the hospital encounter of 03/15/15  MRSA PCR Screening     Status: None   Collection Time: 03/15/15  7:56 PM  Result Value Ref Range Status   MRSA by PCR NEGATIVE NEGATIVE Final    Comment:        The GeneXpert MRSA Assay (FDA approved for NASAL specimens only), is one component of a comprehensive MRSA colonization surveillance program. It is not intended to diagnose MRSA infection nor to guide or monitor treatment for MRSA infections.     Coagulation Studies: No results for input(s): LABPROT, INR in the last 72 hours.  Urinalysis: No results for input(s): COLORURINE, LABSPEC, PHURINE, GLUCOSEU, HGBUR, BILIRUBINUR, KETONESUR, PROTEINUR, UROBILINOGEN, NITRITE, LEUKOCYTESUR in the last 72 hours.  Invalid input(s): APPERANCEUR    Imaging: Dg Chest 2 View  07/17/2015  CLINICAL DATA:  Shortness of breath EXAM: CHEST  2 VIEW COMPARISON:  03/15/2015 FINDINGS: Chronic cardiopericardial enlargement. Small pleural effusions including fissural fluid seen laterally.  Diffuse septal thickening with Kerley lines. IMPRESSION: CHF. Electronically Signed   By: Monte Fantasia M.D.   On: 07/17/2015 10:37   US Renal  07/17/2015  CLINICAL DATA:  History of renal failure, acute on chronic, diabetes, hypertension EXAM: RENAL / URINARY TRACT ULTRASOUND COMPLETE COMPARISON:  Ultrasound of the kidneys of 03/19/2015 FINDINGS: Right Kidney: Length: 7.1 cm. No hydronephrosis is seen. The echogenicity of the renal parenchyma is increased consistent with chronic renal medical disease. Within the midportion of the right kidney, there  is a hypoechoic structure present of 1.3 x 2.4 x 2.0 cm. This probably represents a cyst, but with no significant through transmission, a solid lesion is difficult to exclude. Consider CT or MRI if further assessment is warranted Left Kidney: Length: 9.1 cm. No hydronephrosis is seen. The echogenicity of the left kidney also is increased consistent with chronic renal medical disease. Bladder: The urinary bladder is unremarkable. Bilateral ureteral jets are visualized. IMPRESSION: 1. No hydronephrosis. 2. Echogenic renal parenchyma bilaterally consistent with chronic renal medical disease. 3. 2.4 cm hypoechoic structure in the mid right kidney not seen previously, with very little through transmission. Consider CT or MRI if further assessment is warranted. Electronically Signed   By: Ivar Drape M.D.   On: 07/17/2015 13:41     Medications:   . furosemide (LASIX) infusion     . aspirin EC  81 mg Oral Daily  . docusate sodium  100 mg Oral BID  . gabapentin  100 mg Oral BID  . glipiZIDE  5 mg Oral QAC breakfast  . heparin subcutaneous  5,000 Units Subcutaneous Q8H  . insulin aspart  0-15 Units Subcutaneous TID WC  . insulin aspart  3 Units Subcutaneous TID WC   acetaminophen **OR** acetaminophen, albuterol, hydrALAZINE, ondansetron **OR** ondansetron (ZOFRAN) IV  Assessment/ Plan:  55 y.o. female  with diastolic congestive heart failure, hypertension, diabetes mellitus type II, diabetic retinopathy, diabetic neuropathy, CKD stage IV/V  1.  Acute renal failure/chronic kidney disease stage IV/V:  The patient has had rather progressive decline in renal function recently.  Most recent outpatient EGFR was 12.  Patient appears to be having signs of volume overload.  We will attempt a trial of Lasix drip to see if we can treat the underlying volume overload.  If this is unsuccessful we will likely transition to dialysis in the relative near future.  2.  Pulmonary edema. As evidenced by chest x-ray and  symptoms.  Patient started on Lasix drip as above.  3.  Anemia chronic kidney disease.  Hemoglobin currently 8.5. Consider Epogen.  4.  Secondary hyperparathyroidism.  We plan to reevaluate bone mineral metabolism parameters.  Further plan based upon these labs.  5. Thanks for consultation.    LOS: 1 Juniper Cobey 7/7/20172:57 PM

## 2015-07-18 NOTE — Progress Notes (Signed)
*  PRELIMINARY RESULTS* Echocardiogram 2D Echocardiogram has been performed.  Sherrie Sport 07/18/2015, 9:36 AM

## 2015-07-18 NOTE — Progress Notes (Signed)
[place] at [facility]                                                                                                                                                                                            Patient Demographics   Jacqueline Rodriguez, is a 55 y.o. female, DOB - [birth year], [id number]  Admit date - 07/17/2015   Admitting Physician Epifanio Lesches, MD  Outpatient Primary MD for the patient is Christie Nottingham., PA   LOS - 1  Subjective:pt Continues to complain of shortness of breath and swelling Patient did not put out much urine.    Review of Systems:   CONSTITUTIONAL: No documented fever. No fatigue, weakness. No weight gain, no weight loss.  EYES: No blurry or double vision.  ENT: No tinnitus. No postnasal drip. No redness of the oropharynx.  RESPIRATORY: No cough, no wheeze, no hemoptysis. No dyspnea.  CARDIOVASCULAR: No chest pain.+ orthopnea. No palpitations. No syncope.  GASTROINTESTINAL: No nausea, no vomiting or diarrhea. No abdominal pain. No melena or hematochezia.  GENITOURINARY: No dysuria or hematuria.  ENDOCRINE: No polyuria or nocturia. No heat or cold intolerance.  HEMATOLOGY: No anemia. No bruising. No bleeding.  INTEGUMENTARY: No rashes. No lesions.  MUSCULOSKELETAL: No arthritis. Positive swelling. No gout.  NEUROLOGIC: No numbness, tingling, or ataxia. No seizure-type activity.  PSYCHIATRIC: No anxiety. No insomnia. No ADD.    Vitals:   Filed Vitals:   07/17/15 1400 07/17/15 1437 07/18/15 0413 07/18/15 0510  BP: 165/66 172/78 177/73 130/64  Pulse: 84 85 79 82  Temp:  97.6 F (36.4 C) 97.8 F (36.6 C)   TempSrc:  Oral Oral   Resp:  20 18   Height:      Weight:   109.408 kg (241 lb 3.2 oz)   SpO2: 99% 96% 98% 100%    Wt Readings from Last 3 Encounters:  07/18/15 109.408 kg (241 lb 3.2 oz)  04/30/15 97.07 kg (214 lb)  03/25/15 102.059 kg (225 lb)     Intake/Output Summary (Last 24 hours) at  07/18/15 1114 Last data filed at 07/18/15 1034  Gross per 24 hour  Intake    480 ml  Output   1350 ml  Net   -870 ml    Physical Exam:   GENERAL: Pleasant-appearing in no apparent distress.  HEAD, EYES, EARS, NOSE AND THROAT: Atraumatic, normocephalic. Extraocular muscles are intact. Pupils equal and reactive to light. Sclerae anicteric. No conjunctival injection. No oro-pharyngeal erythema.  NECK: Supple. There is no jugular venous distention. No bruits, no lymphadenopathy, no thyromegaly.  HEART: Regular rate and rhythm,.  No murmurs, no rubs, no clicks.  LUNGS: Clear to auscultation bilaterally. No rales or rhonchi. No wheezes.  ABDOMEN: Soft, flat, nontender, nondistended. Has good bowel sounds. No hepatosplenomegaly appreciated.  EXTREMITIES: No evidence of any cyanosis, clubbing, or  2+ peripheral edema.  +2 pedal and radial pulses bilaterally.  NEUROLOGIC: The patient is alert, awake, and oriented x3 with no focal motor or sensory deficits appreciated bilaterally.  SKIN: Moist and warm with no rashes appreciated.  Psych: Not anxious, depressed LN: No inguinal LN enlargement    Antibiotics   Anti-infectives    None      Medications   Scheduled Meds: . aspirin EC  81 mg Oral Daily  . docusate sodium  100 mg Oral BID  . enoxaparin (LOVENOX) injection  30 mg Subcutaneous Q24H  . furosemide  40 mg Intravenous Q12H  . gabapentin  100 mg Oral BID  . glipiZIDE  5 mg Oral QAC breakfast  . insulin aspart  0-15 Units Subcutaneous TID WC  . insulin aspart  3 Units Subcutaneous TID WC   Continuous Infusions:  PRN Meds:.acetaminophen **OR** acetaminophen, albuterol, hydrALAZINE, ondansetron **OR** ondansetron (ZOFRAN) IV   Data Review:   Micro Results No results found for this or any previous visit (from the past 240 hour(s)).  Radiology Reports Dg Chest 2 View  07/17/2015  CLINICAL DATA:  Shortness of breath EXAM: CHEST  2 VIEW COMPARISON:  03/15/2015 FINDINGS: Chronic  cardiopericardial enlargement. Small pleural effusions including fissural fluid seen laterally. Diffuse septal thickening with Kerley lines. IMPRESSION: CHF. Electronically Signed   By: Monte Fantasia M.D.   On: 07/17/2015 10:37   US Renal  07/17/2015  CLINICAL DATA:  History of renal failure, acute on chronic, diabetes, hypertension EXAM: RENAL / URINARY TRACT ULTRASOUND COMPLETE COMPARISON:  Ultrasound of the kidneys of 03/19/2015 FINDINGS: Right Kidney: Length: 7.1 cm. No hydronephrosis is seen. The echogenicity of the renal parenchyma is increased consistent with chronic renal medical disease. Within the midportion of the right kidney, there is a hypoechoic structure present of 1.3 x 2.4 x 2.0 cm. This probably represents a cyst, but with no significant through transmission, a solid lesion is difficult to exclude. Consider CT or MRI if further assessment is warranted Left Kidney: Length: 9.1 cm. No hydronephrosis is seen. The echogenicity of the left kidney also is increased consistent with chronic renal medical disease. Bladder: The urinary bladder is unremarkable. Bilateral ureteral jets are visualized. IMPRESSION: 1. No hydronephrosis. 2. Echogenic renal parenchyma bilaterally consistent with chronic renal medical disease. 3. 2.4 cm hypoechoic structure in the mid right kidney not seen previously, with very little through transmission. Consider CT or MRI if further assessment is warranted. Electronically Signed   By: Ivar Drape M.D.   On: 07/17/2015 13:41     CBC  Recent Labs Lab 07/17/15 1129 07/18/15 0503  WBC 8.8 7.9  HGB 9.0* 8.5*  HCT 27.4* 24.8*  PLT 192 168  MCV 82.8 82.4  MCH 27.2 28.2  MCHC 32.9 34.2  RDW 16.6* 16.0*    Chemistries   Recent Labs Lab 07/17/15 1129 07/18/15 0503  NA 139 137  K 4.9 4.8  CL 110 110  CO2 20* 20*  GLUCOSE 159* 154*  BUN 52* 53*  CREATININE 4.64* 4.78*  CALCIUM 8.3* 7.9*  AST 20  --   ALT 19  --   ALKPHOS 83  --   BILITOT 0.6  --     ------------------------------------------------------------------------------------------------------------------ estimated creatinine clearance is 17.1 mL/min (by  C-G formula based on Cr of 4.78). ------------------------------------------------------------------------------------------------------------------ No results for input(s): HGBA1C in the last 72 hours. ------------------------------------------------------------------------------------------------------------------ No results for input(s): CHOL, HDL, LDLCALC, TRIG, CHOLHDL, LDLDIRECT in the last 72 hours. ------------------------------------------------------------------------------------------------------------------ No results for input(s): TSH, T4TOTAL, T3FREE, THYROIDAB in the last 72 hours.  Invalid input(s): FREET3 ------------------------------------------------------------------------------------------------------------------ No results for input(s): VITAMINB12, FOLATE, FERRITIN, TIBC, IRON, RETICCTPCT in the last 72 hours.  Coagulation profile No results for input(s): INR, PROTIME in the last 168 hours.  No results for input(s): DDIMER in the last 72 hours.  Cardiac Enzymes  Recent Labs Lab 07/17/15 1129  TROPONINI 0.03*   ------------------------------------------------------------------------------------------------------------------ Invalid input(s): POCBNP    Assessment & Plan  Patient is a 55 year old white female with history of diastolic congestive heart failure presents with shortness of breath #1. Acute on chronic diastolic congestive heart failure: Continue IV Lasix Patient has not put out much urine, renal function slightly worst We'll await nephrology evaluation no need to repeat echo since it was done recently  #2 acute respiratory failure due to acute CHF continued therapy as above   #3 acute on chronic renal failure with chronic kidney disease stage 3  Renal function slightly worst  we'll continue to monitor nephrology input  #4 diabetes mellitus type 2: Continue glipizide, add sliding scale with coverage.  #5 diabetes neuropathy: Continue Neurontin.  #6 malignant hypertension . Blood pressure currently improved continue therapy with hydralazine      Code Status Orders        Start     Ordered   07/17/15 1241  Full code   Continuous     07/17/15 1244    Code Status History    Date Active Date Inactive Code Status Order ID Comments User Context   03/15/2015  7:56 PM 03/20/2015  3:48 PM Full Code [id number]  Idelle Crouch, MD Inpatient           Consults  Cardiology and nephrology DVT Prophylaxis  Lovenox  Lab Results  Component Value Date   PLT 168 07/18/2015     Time Spent in minutes   35 minutes Greater than 50% of time spent in care coordination and counseling patient regarding the condition and plan of care.   Dustin Flock M.D on 07/18/2015 at 11:14 AM  Between 7am to 6pm - Pager - [phone]  After 6pm go to [email or web address] - password [email or web address] [facility]   Office  [phone]

## 2015-07-18 NOTE — Consult Note (Signed)
Cardiology Consultation Note  Patient ID: Jacqueline Rodriguez, MRN: [id number], DOB/AGE: [birth year] 55 y.o. Admit date: 07/17/2015   Date of Consult: 07/18/2015 Primary Physician: Christie Nottingham., PA Primary Cardiologist: New to [facility] - previously seen by Dr. Chancy Milroy, MD, patient requested [facility] (transfering to Dr. Yvone Neu, MD) Requesting Physician: Dr. Vianne Bulls, MD  Chief Complaint: SOB Reason for Consult: Acute on chronic diastolic CHF  HPI: 55 y.o. female with h/o chronic diastolic CHF, pulmonary hypertension, CKD stage III, poorly controlled HTN, on nighttime oxygen at 2L, and obesity who presented to [facility] on 7/6 with acute on chronic diastolic CHF, acute on CKD stage III, and malignant HTN.   She was recently admitted to [facility] in March 2017 with acute on chronic diastolic CHF in the setting of malignant hypertension. Echo showed EF of 50-55%, GR1DD, mild MR, LA mildly dilated, PASP 42 mm Hg. At that time BNP was 400+. SCr peaked a 2.9 at that time . She was diuresed and discharged. She previously followed with Dr. Chancy Milroy, MD though requests change to Dr. Yvone Neu, MD. She was seen by the [facility] CHF clinic most recently on 04/30/15. At that time she was noting fatigue with exertion felt to be from difficulty sleeping. She was hypertensive with readings in the 180 range. There was no changes made.   She presented to [facility] on 7/6 with complaints of increased SOB for 2 days prior to her admission along with increased LE edema. Weight has been stable at 220 pounds at home per her report. At time of admission she is noted to weigh 241 pounds. Her BP remains elevated at home in the [id number] systolic range. She reports increased stress. She has stable 2-pillow orthopnea. She denies any early satiety. She does drink more than 2 L of fluids in a day. She eats out at restaurants and attempts to put salt on her food if her husband will let her. She has not had to use her oxygen during the day at this time.   Upon the patient's  arrival to [facility] they were found to have BNP 261, SCR 4.64-->4.78 (baseline 2.6), BUN 52-->53, hgb 9.0-->8.5 (baseline mid 10 range), troponin 0.03 x 1. BP [id number] systolic upon arrival. Now [id number] systolic. ECG showed NSR, 82 bpm, poor R wave progression, lateral TWI, CXR showed CHF. She was started on Lasix 40 mg bid with UOP 910 mL. Currently, on oxygen at 2 L via nasal cannula.   Past Medical History  Diagnosis Date  . Essential hypertension   . Diabetes mellitus with renal complications ([place])   . Chronic diastolic CHF (congestive heart failure) (Jacqueline Rodriguez)   . CKD (chronic kidney disease), stage III   . Chronic bronchitis ([place])   . Pulmonary hypertension ([place])   . Obesity       Most Recent Cardiac Studies: Echo 03/16/2015: Study Conclusions  - Left ventricle: The cavity size was mildly dilated. Systolic  function was normal. The estimated ejection fraction was 50%.  Doppler parameters are consistent with abnormal left ventricular  relaxation (grade 1 diastolic dysfunction). - Aortic valve: Valve area (Vmax): 1.13 cm^2. - Mitral valve: There was mild regurgitation. - Left atrium: The atrium was mildly dilated. - Tricuspid valve: There was moderate regurgitation. - Pulmonary arteries: PA peak pressure: 42 mm Hg (S).  Impressions:  - The right ventricular systolic pressure was increased consistent  with moderate pulmonary hypertension. there is a borderline left  ventricle systolic function with normal LVEF of 51% and mild  diastolic dysfunction  and mild mitral regurgitation and moderate  tricuspid regurgitation and moderate to severe pulmonary  hypertension.   Surgical History: History reviewed. No pertinent past surgical history.   Home Meds: Prior to Admission medications   Medication Sig Start Date End Date Taking? Authorizing Provider  albuterol (PROVENTIL HFA;VENTOLIN HFA) 108 (90 Base) MCG/ACT inhaler Inhale 2 puffs into the lungs every 6 (six) hours as needed for  wheezing or shortness of breath. 01/08/15  Yes Gregor Hams, MD  aspirin EC 81 MG EC tablet Take 1 tablet (81 mg total) by mouth daily. 03/20/15  Yes Vaughan Basta, MD  gabapentin (NEURONTIN) 100 MG capsule Take 100 mg by mouth 3 (three) times daily as needed. For neuropathy. 03/05/15  Yes Historical Provider, MD  glipiZIDE (GLUCOTROL) 5 MG tablet Take 5 mg by mouth daily before breakfast.    Yes Historical Provider, MD  lisinopril (PRINIVIL,ZESTRIL) 5 MG tablet Take 5 mg by mouth daily.   Yes Historical Provider, MD    Inpatient Medications:  . aspirin EC  81 mg Oral Daily  . docusate sodium  100 mg Oral BID  . enoxaparin (LOVENOX) injection  30 mg Subcutaneous Q24H  . furosemide  40 mg Intravenous Q12H  . gabapentin  100 mg Oral BID  . glipiZIDE  5 mg Oral QAC breakfast  . insulin aspart  0-15 Units Subcutaneous TID WC  . insulin aspart  3 Units Subcutaneous TID WC      Allergies:  Allergies  Allergen Reactions  . Ambien [Zolpidem] Other (See Comments)    hallucinations    Social History   Social History  . Marital Status: Married    Spouse Name: N/A  . Number of Children: N/A  . Years of Education: N/A   Occupational History  . Not on file.   Social History Main Topics  . Smoking status: Former Smoker    Types: Cigarettes    Quit date: 03/24/1985  . Smokeless tobacco: Never Used  . Alcohol Use: No  . Drug Use: No  . Sexual Activity: Not on file   Other Topics Concern  . Not on file   Social History Narrative     Family History  Problem Relation Age of Onset  . Hypertension Mother   . Diabetes Mellitus II Mother   . CAD Father   . Hypertension Father   . Heart attack Father      Review of Systems: Review of Systems  Constitutional: Positive for malaise/fatigue. Negative for fever, chills, weight loss and diaphoresis.  HENT: Negative for congestion.   Eyes: Negative for discharge and redness.  Respiratory: Positive for cough and shortness of  breath. Negative for hemoptysis, sputum production and wheezing.   Cardiovascular: Positive for orthopnea and leg swelling. Negative for chest pain, palpitations, claudication and PND.  Gastrointestinal: Negative for heartburn, nausea, vomiting, abdominal pain, blood in stool and melena.  Genitourinary: Negative for hematuria.  Musculoskeletal: Negative for myalgias and falls.  Skin: Negative for rash.  Neurological: Positive for weakness. Negative for dizziness, tingling, tremors, sensory change, speech change, focal weakness and loss of consciousness.  Endo/Heme/Allergies: Does not bruise/bleed easily.  Psychiatric/Behavioral: Negative for substance abuse. The patient is not nervous/anxious.   All other systems reviewed and are negative.   Labs:  Recent Labs  07/17/15 1129  TROPONINI 0.03*   Lab Results  Component Value Date   WBC 7.9 07/18/2015   HGB 8.5* 07/18/2015   HCT 24.8* 07/18/2015   MCV 82.4 07/18/2015   PLT  168 07/18/2015     Recent Labs Lab 07/17/15 1129 07/18/15 0503  NA 139 137  K 4.9 4.8  CL 110 110  CO2 20* 20*  BUN 52* 53*  CREATININE 4.64* 4.78*  CALCIUM 8.3* 7.9*  PROT 7.1  --   BILITOT 0.6  --   ALKPHOS 83  --   ALT 19  --   AST 20  --   GLUCOSE 159* 154*   No results found for: CHOL, HDL, LDLCALC, TRIG No results found for: DDIMER  Radiology/Studies:  Dg Chest 2 View  07/17/2015  CLINICAL DATA:  Shortness of breath EXAM: CHEST  2 VIEW COMPARISON:  03/15/2015 FINDINGS: Chronic cardiopericardial enlargement. Small pleural effusions including fissural fluid seen laterally. Diffuse septal thickening with Kerley lines. IMPRESSION: CHF. Electronically Signed   By: Monte Fantasia M.D.   On: 07/17/2015 10:37   US Renal  07/17/2015  CLINICAL DATA:  History of renal failure, acute on chronic, diabetes, hypertension EXAM: RENAL / URINARY TRACT ULTRASOUND COMPLETE COMPARISON:  Ultrasound of the kidneys of 03/19/2015 FINDINGS: Right Kidney: Length: 7.1  cm. No hydronephrosis is seen. The echogenicity of the renal parenchyma is increased consistent with chronic renal medical disease. Within the midportion of the right kidney, there is a hypoechoic structure present of 1.3 x 2.4 x 2.0 cm. This probably represents a cyst, but with no significant through transmission, a solid lesion is difficult to exclude. Consider CT or MRI if further assessment is warranted Left Kidney: Length: 9.1 cm. No hydronephrosis is seen. The echogenicity of the left kidney also is increased consistent with chronic renal medical disease. Bladder: The urinary bladder is unremarkable. Bilateral ureteral jets are visualized. IMPRESSION: 1. No hydronephrosis. 2. Echogenic renal parenchyma bilaterally consistent with chronic renal medical disease. 3. 2.4 cm hypoechoic structure in the mid right kidney not seen previously, with very little through transmission. Consider CT or MRI if further assessment is warranted. Electronically Signed   By: Ivar Drape M.D.   On: 07/17/2015 13:41    EKG: Interpreted by me showed: NSR, 82 bpm, poor R wave progression, lateral TWI Telemetry: Interpreted by me showed: NSR, 80's bpm  Weights: Filed Weights   07/17/15 1014 07/18/15 0413  Weight: 220 lb (99.791 kg) 241 lb 3.2 oz (109.408 kg)     Physical Exam: Blood pressure 130/64, pulse 82, temperature 97.8 F (36.6 C), temperature source Oral, resp. rate 18, height 5\' 9"  (1.753 m), weight 241 lb 3.2 oz (109.408 kg), SpO2 100 %. Body mass index is 35.6 kg/(m^2). General: Well developed, well nourished, in no acute distress. Head: Normocephalic, atraumatic, sclera non-icteric, no xanthomas, nares are without discharge.  Neck: Negative for carotid bruits. JVD not elevated. Lungs: Crackles along bilateral bases. Breathing is unlabored. Heart: RRR with S1 S2. II/VI systolic murmur RUSB, no rubs, or gallops appreciated. Abdomen: Obese, soft, non-tender, non-distended with normoactive bowel sounds. No  hepatomegaly. No rebound/guarding. No obvious abdominal masses. Msk:  Strength and tone appear normal for age. Extremities: No clubbing or cyanosis. 1+ non-pitting edema along bilateral LE. Neuro: Alert and oriented X 3. No facial asymmetry. No focal deficit. Moves all extremities spontaneously. Psych:  Responds to questions appropriately with a normal affect.    Assessment and Plan:  Principal Problem:   Acute on chronic diastolic CHF (congestive heart failure), NYHA class 1 (HCC) Active Problems:   Pulmonary hypertension (HCC)   Acute renal failure superimposed on stage 3 chronic kidney disease (HCC)   Diabetes mellitus with renal complications (  [place])   Obesity    1. Acute on chronic diastolic CHF/pulmonary hypertension: -Likely in the setting of poor dietary compliance and poorly controlled HTN -On IV Lasix 40 mg bid per renal -Echo pending per prior cardiologist  -Look to start Coreg as breathing improves -Poor dietary compliance with significant volume intake, eating out at restaurants and adding salt to her food -CHF education   2. Acute on CKD stage III: -Renal function is worsening this morning with diuresis on 7/6 -Renal on board to guide diuretic therapy  3. Malignant hypertension: -Improved with medication -Monitor  4. Obesity/possible OHS: -Per IM  5. Acute on chronic anemia: -May be playing a large role inher symptoms -Needs workup -? /ironProcrit -Per IM   Signed, Christell Faith, PA-C [place] Pager: [phone] 07/18/2015, 8:19 AM

## 2015-07-18 NOTE — Progress Notes (Signed)
CBG 139 per [place] NT. Glucometer not synching yet.

## 2015-07-18 NOTE — Care Management (Signed)
Met with patient to discuss discharge planning. Lives at home with her spouse. She does not work, drive but is independent with adls. She is the caregiver of her grandchildren and mother. Uses O2 @ 2L Q HS. No home health. Patient states she doesn't need it. Is active with the CHF clinic. Noncompliant with diet. No discharge needs anticipated.

## 2015-07-19 DIAGNOSIS — I509 Heart failure, unspecified: Secondary | ICD-10-CM

## 2015-07-19 LAB — CBC
HCT: 25.9 % — ABNORMAL LOW (ref 35.0–47.0)
HEMOGLOBIN: 8.6 g/dL — AB (ref 12.0–16.0)
MCH: 28 pg (ref 26.0–34.0)
MCHC: 33.3 g/dL (ref 32.0–36.0)
MCV: 84.2 fL (ref 80.0–100.0)
Platelets: 184 10*3/uL (ref 150–440)
RBC: 3.07 MIL/uL — AB (ref 3.80–5.20)
RDW: 16.6 % — ABNORMAL HIGH (ref 11.5–14.5)
WBC: 9.9 10*3/uL (ref 3.6–11.0)

## 2015-07-19 LAB — GLUCOSE, CAPILLARY
GLUCOSE-CAPILLARY: 101 mg/dL — AB (ref 65–99)
GLUCOSE-CAPILLARY: 56 mg/dL — AB (ref 65–99)
GLUCOSE-CAPILLARY: 59 mg/dL — AB (ref 65–99)
GLUCOSE-CAPILLARY: 78 mg/dL (ref 65–99)
Glucose-Capillary: 77 mg/dL (ref 65–99)
Glucose-Capillary: 105 mg/dL — ABNORMAL HIGH (ref 65–99)

## 2015-07-19 LAB — OCCULT BLOOD X 1 CARD TO LAB, STOOL: Fecal Occult Bld: POSITIVE — AB

## 2015-07-19 LAB — POTASSIUM: POTASSIUM: 4.8 mmol/L (ref 3.5–5.1)

## 2015-07-19 LAB — CREATININE, SERUM
CREATININE: 4.97 mg/dL — AB (ref 0.44–1.00)
GFR calc non Af Amer: 9 mL/min — ABNORMAL LOW (ref >60)
GFR, EST AFRICAN AMERICAN: 10 mL/min — AB (ref >60)

## 2015-07-19 NOTE — Progress Notes (Addendum)
Fecal Occult positive - updated Dr. Anselm Jungling. Order to place GI consult and d/c subq heparin. Also informed MD that patient's blood sugar has dropped twice over the past 2 shifts, both times when patient did not qualify for sliding scale coverage but did qualify for meal coverage (CBG >80 and eating most of each tray). Order to d/c meal coverage insulin and keep sliding scale order as is.

## 2015-07-19 NOTE — Progress Notes (Signed)
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 Renal has been following.  A Lasix drip was started yesterday with a modest diuresis of 1400 cc ie 5020 cc an hour -- creatinine is continued to increase  ECG is notable for T-wave inversions and poor R-wave progression  No  previous catheterization;  She has never had a sleep study  Past Medical History  Diagnosis Date  . Essential hypertension   . Diabetes mellitus with renal complications ([place])   . Chronic diastolic CHF (congestive heart failure) ([place])   . CKD (chronic kidney disease), stage III   . Chronic bronchitis ([place])   . Pulmonary hypertension ([place])   . Obesity     Scheduled Meds:  Scheduled Meds: . aspirin EC  81 mg Oral Daily  . docusate sodium  100 mg Oral BID  . gabapentin  100 mg Oral BID  . glipiZIDE  5 mg Oral QAC breakfast  . heparin subcutaneous  5,000 Units Subcutaneous Q8H  . insulin aspart  0-15 Units Subcutaneous TID WC  . insulin aspart  3 Units Subcutaneous TID WC   Continuous Infusions: . furosemide (LASIX) infusion 8 mg/hr (07/18/15 1456)   acetaminophen **OR** acetaminophen, albuterol, diphenhydrAMINE, hydrALAZINE, ondansetron **OR** ondansetron (ZOFRAN) IV    PHYSICAL EXAM Filed Vitals:   07/18/15 1945 07/18/15 1953 07/19/15 0430 07/19/15 0500  BP: 189/61 160/70 158/66   Pulse: 82  84   Temp: 97.8 F (36.6 C)  98.8 F (37.1 C)   TempSrc: Oral  Oral   Resp: 18  18   Height:      Weight:    246 lb 14.4 oz (111.993 kg)  SpO2: 98%  97%    Well developed and nourished in no acute distress HENT normal Neck supple with JVP-unable to discern  Decreased breath sounds L chest with  some crackles Regular rate and rhythm, no murmurs or gallops Abd-soft with active BS No Clubbing cyanosis 2+ edema Skin-warm and dry A & Oriented  Grossly normal sensory and motor function   TELEMETRY: Reviewed telemetry pt in nsr:    Intake/Output Summary (Last 24 hours) at 07/19/15 1007 Last data filed at 07/19/15 0958  Gross per 24 hour  Intake 953.26 ml  Output   1450 ml  Net -496.74 ml    LABS: Basic Metabolic Panel:  Recent Labs Lab 07/17/15 1129 07/18/15 0503 07/19/15 0421  NA 139 137  --   K 4.9 4.8 4.8  CL 110 110  --   CO2 20* 20*  --   GLUCOSE 159* 154*  --   BUN 52* 53*  --   CREATININE 4.64* 4.78* 4.97*  CALCIUM 8.3* 7.9*  --    Cardiac Enzymes:  Recent Labs  07/17/15 1129  TROPONINI 0.03*   CBC:  Recent Labs Lab 07/17/15 1129 07/18/15 0503 07/19/15 0421  WBC 8.8 7.9 9.9  HGB 9.0* 8.5* 8.6*  HCT 27.4* 24.8* 25.9*  MCV 82.8 82.4 84.2  PLT 192 168 184   PROTIME: No results for input(s): LABPROT, INR in the last 72 hours. Liver Function Tests:  Recent Labs  07/17/15 1129  AST 20  ALT 19  ALKPHOS 83  BILITOT 0.6  PROT 7.1  ALBUMIN 3.0*   No results for input(s): LIPASE, AMYLASE in the last 72 hours. BNP: BNP (last 3 results)  Recent Labs  03/15/15 1601 07/17/15 1129  BNP 415.0* 261.0*      ASSESSMENT AND PLAN:  Principal Problem:   Acute on chronic diastolic CHF (congestive heart failure), NYHA class 1 (HCC) Active Problems:   Diabetes mellitus with renal complications (HCC)   Pulmonary hypertension (HCC)   Obesity   Acute renal failure superimposed on stage 3 chronic kidney disease (HCC)   Morbid obesity due to excess calories (HCC)   Anemia in chronic renal disease   Shortness of breath  Fluid management is deferred to renal.   The use of metolazone and or torsemide might be of more benefit Reviewing guidelines and the use of Procrit, I find form uptodate that it is>>> ?We recommend against the use of  erythropoiesis-stimulating agents in patients with mild to moderate anemia and HF  She needs outpt sleep study and eval for pulm hypertension with failed RV and still PA pressures of 50      Signed, Virl Axe MD  07/19/2015

## 2015-07-19 NOTE — Progress Notes (Signed)
[facility]  ROUNDING NOTE   Subjective:  Renal function predictably worse with Lasix drip. Urine output of 1.3 L yesterday. States her shortness of breath has improved however.   Objective:  Vital signs in last 24 hours:  Temp:  [97.8 F (36.6 C)-98.8 F (37.1 C)] 98.5 F (36.9 C) (07/08 1123) Pulse Rate:  [78-84] 79 (07/08 1203) Resp:  [18-19] 19 (07/08 1123) BP: (147-189)/(55-75) 168/70 mmHg (07/08 1203) SpO2:  [97 %-100 %] 100 % (07/08 1123) Weight:  [111.993 kg (246 lb 14.4 oz)] 111.993 kg (246 lb 14.4 oz) (07/08 0500)  Weight change: 12.202 kg (26 lb 14.4 oz) Filed Weights   07/17/15 1014 07/18/15 0413 07/19/15 0500  Weight: 99.791 kg (220 lb) 109.408 kg (241 lb 3.2 oz) 111.993 kg (246 lb 14.4 oz)    Intake/Output: I/O last 3 completed shifts: In: 833.3 [P.O.:720; I.V.:113.3] Out: 1950 [Urine:1950]   Intake/Output this shift:  Total I/O In: 360 [P.O.:360] Out: 200 [Urine:200]  Physical Exam: General: NAD, laying in bed  Head: Normocephalic, atraumatic. Moist oral mucosal membranes  Eyes: Anicteric  Neck: Supple, trachea midline  Lungs:  Basilar rales, normal effort  Heart: Regular rate and rhythm,no obvious rub  Abdomen:  Soft, nontender, BS present  Extremities: 2+ peripheral edema.  Neurologic: Nonfocal, moving all four extremities  Skin: No lesions       Basic Metabolic Panel:  Recent Labs Lab 07/17/15 1129 07/18/15 0503 07/19/15 0421  NA 139 137  --   K 4.9 4.8 4.8  CL 110 110  --   CO2 20* 20*  --   GLUCOSE 159* 154*  --   BUN 52* 53*  --   CREATININE 4.64* 4.78* 4.97*  CALCIUM 8.3* 7.9*  --     Liver Function Tests:  Recent Labs Lab 07/17/15 1129  AST 20  ALT 19  ALKPHOS 83  BILITOT 0.6  PROT 7.1  ALBUMIN 3.0*   No results for input(s): LIPASE, AMYLASE in the last 168 hours. No results for input(s): AMMONIA in the last 168 hours.  CBC:  Recent Labs Lab 07/17/15 1129 07/18/15 0503 07/19/15 0421  WBC 8.8  7.9 9.9  HGB 9.0* 8.5* 8.6*  HCT 27.4* 24.8* 25.9*  MCV 82.8 82.4 84.2  PLT 192 168 184    Cardiac Enzymes:  Recent Labs Lab 07/17/15 1129  TROPONINI 0.03*    BNP: Invalid input(s): POCBNP  CBG:  Recent Labs Lab 07/18/15 2048 07/19/15 0732 07/19/15 1117 07/19/15 1205 07/19/15 1229  GLUCAP 153* 101* 56* 59* 23    Microbiology: Results for orders placed or performed during the hospital encounter of 03/15/15  MRSA PCR Screening     Status: None   Collection Time: 03/15/15  7:56 PM  Result Value Ref Range Status   MRSA by PCR NEGATIVE NEGATIVE Final    Comment:        The GeneXpert MRSA Assay (FDA approved for NASAL specimens only), is one component of a comprehensive MRSA colonization surveillance program. It is not intended to diagnose MRSA infection nor to guide or monitor treatment for MRSA infections.     Coagulation Studies: No results for input(s): LABPROT, INR in the last 72 hours.  Urinalysis: No results for input(s): COLORURINE, LABSPEC, PHURINE, GLUCOSEU, HGBUR, BILIRUBINUR, KETONESUR, PROTEINUR, UROBILINOGEN, NITRITE, LEUKOCYTESUR in the last 72 hours.  Invalid input(s): APPERANCEUR    Imaging: US Renal  07/17/2015  CLINICAL DATA:  History of renal failure, acute on chronic, diabetes, hypertension EXAM: RENAL / URINARY  TRACT ULTRASOUND COMPLETE COMPARISON:  Ultrasound of the kidneys of 03/19/2015 FINDINGS: Right Kidney: Length: 7.1 cm. No hydronephrosis is seen. The echogenicity of the renal parenchyma is increased consistent with chronic renal medical disease. Within the midportion of the right kidney, there is a hypoechoic structure present of 1.3 x 2.4 x 2.0 cm. This probably represents a cyst, but with no significant through transmission, a solid lesion is difficult to exclude. Consider CT or MRI if further assessment is warranted Left Kidney: Length: 9.1 cm. No hydronephrosis is seen. The echogenicity of the left kidney also is increased  consistent with chronic renal medical disease. Bladder: The urinary bladder is unremarkable. Bilateral ureteral jets are visualized. IMPRESSION: 1. No hydronephrosis. 2. Echogenic renal parenchyma bilaterally consistent with chronic renal medical disease. 3. 2.4 cm hypoechoic structure in the mid right kidney not seen previously, with very little through transmission. Consider CT or MRI if further assessment is warranted. Electronically Signed   By: Ivar Drape M.D.   On: 07/17/2015 13:41     Medications:   . furosemide (LASIX) infusion 8 mg/hr (07/18/15 1456)   . aspirin EC  81 mg Oral Daily  . docusate sodium  100 mg Oral BID  . gabapentin  100 mg Oral BID  . glipiZIDE  5 mg Oral QAC breakfast  . heparin subcutaneous  5,000 Units Subcutaneous Q8H  . insulin aspart  0-15 Units Subcutaneous TID WC  . insulin aspart  3 Units Subcutaneous TID WC   acetaminophen **OR** acetaminophen, albuterol, diphenhydrAMINE, hydrALAZINE, ondansetron **OR** ondansetron (ZOFRAN) IV  Assessment/ Plan:  55 y.o. female  with diastolic congestive heart failure, hypertension, diabetes mellitus type II, diabetic retinopathy, diabetic neuropathy, CKD stage IV/V  1.  Acute renal failure/chronic kidney disease stage IV/V:  The patient has had rather progressive decline in renal function recently.  Most recent outpatient EGFR was 12.  Patient appears to be having signs of volume overload.   -  Patient appears to be tolerating Lasix drip fairly well. We will increase Lasix drip 10 mg per hour. Renal function productively worsening. We will proceed with initiation of renal replacement therapy on Monday once we can have a PermCath placed.  2.  Pulmonary edema. Clinically improved with Lasix drip. However given very low renal function she will end up needing dialysis to maintain her volume status.  3.  Anemia chronic kidney disease.  Consider Epogen once we start dialysis.  4.  Secondary hyperparathyroidism.  We plan to  reevaluate bone mineral metabolism parameters once she is on dialysis.  Further plan based upon these labs.     LOS: 2 Nissi Doffing 7/8/201712:42 PM

## 2015-07-19 NOTE — Plan of Care (Signed)
Problem: Fluid Volume: Goal: Ability to maintain a balanced intake and output will improve Outcome: Not Progressing Patient has generalized edema.

## 2015-07-19 NOTE — Progress Notes (Signed)
[place] [facility]                                                                                                                                                                                            Patient Demographics   Jacqueline Rodriguez, is a 55 y.o. female, DOB - 09-Jun-1960, [id number]  Admit date - 07/17/2015   Admitting Physician Epifanio Lesches, MD  Outpatient Primary MD for the patient is Christie Nottingham., PA   LOS - 2  Subjective:pt Continues to complain of shortness of breath and swelling Patient did not put out much urine. Nephrology started on IV Lasix drip and patient is showing good response to that.   Review of Systems:   CONSTITUTIONAL: No documented fever. No fatigue, weakness. No weight gain, no weight loss.  EYES: No blurry or double vision.  ENT: No tinnitus. No postnasal drip. No redness of the oropharynx.  RESPIRATORY: No cough, no wheeze, no hemoptysis. No dyspnea.  CARDIOVASCULAR: No chest pain.+ orthopnea. No palpitations. No syncope.  GASTROINTESTINAL: No nausea, no vomiting or diarrhea. No abdominal pain. No melena or hematochezia.  GENITOURINARY: No dysuria or hematuria.  ENDOCRINE: No polyuria or nocturia. No heat or cold intolerance.  HEMATOLOGY: No anemia. No bruising. No bleeding.  INTEGUMENTARY: No rashes. No lesions.  MUSCULOSKELETAL: No arthritis. Positive swelling. No gout.  NEUROLOGIC: No numbness, tingling, or ataxia. No seizure-type activity.  PSYCHIATRIC: No anxiety. No insomnia. No ADD.    Vitals:   Filed Vitals:   07/19/15 0500 07/19/15 1123 07/19/15 1203 07/19/15 1347  BP:  170/66 168/70 159/64  Pulse:  80 79 82  Temp:  98.5 F (36.9 C)    TempSrc:  Oral    Resp:  19    Height:      Weight: 111.993 kg (246 lb 14.4 oz)     SpO2:  100%      Wt Readings from Last 3 Encounters:  07/19/15 111.993 kg (246 lb 14.4 oz)  04/30/15 97.07 kg (214 lb)  03/25/15 102.059 kg (225 lb)      Intake/Output Summary (Last 24 hours) at 07/19/15 1412 Last data filed at 07/19/15 1347  Gross per 24 hour  Intake 953.26 ml  Output   1250 ml  Net -296.74 ml    Physical Exam:   GENERAL: Pleasant-appearing in no apparent distress.  HEAD, EYES, EARS, NOSE AND THROAT: Atraumatic, normocephalic. Extraocular muscles are intact. Pupils equal and reactive to light. Sclerae anicteric. No conjunctival injection. No oro-pharyngeal erythema.  NECK: Supple. There is no jugular venous distention. No bruits, no lymphadenopathy, no thyromegaly.  HEART: Regular rate and rhythm,. No murmurs, no rubs, no clicks.  LUNGS: Clear to auscultation bilaterally. No rales or rhonchi. No wheezes.  ABDOMEN: Soft, flat, nontender, nondistended. Has good bowel sounds. No hepatosplenomegaly appreciated.  EXTREMITIES: No evidence of any cyanosis, clubbing, or  2+ peripheral edema.  +2 pedal and radial pulses bilaterally.  NEUROLOGIC: The patient is alert, awake, and oriented x3 with no focal motor or sensory deficits appreciated bilaterally.  SKIN: Moist and warm with no rashes appreciated.  Psych: Not anxious, depressed LN: No inguinal LN enlargement    Antibiotics   Anti-infectives    None      Medications   Scheduled Meds: . aspirin EC  81 mg Oral Daily  . docusate sodium  100 mg Oral BID  . gabapentin  100 mg Oral BID  . glipiZIDE  5 mg Oral QAC breakfast  . heparin subcutaneous  5,000 Units Subcutaneous Q8H  . insulin aspart  0-15 Units Subcutaneous TID WC  . insulin aspart  3 Units Subcutaneous TID WC   Continuous Infusions: . furosemide (LASIX) infusion 8 mg/hr (07/18/15 1456)   PRN Meds:.acetaminophen **OR** acetaminophen, albuterol, diphenhydrAMINE, hydrALAZINE, ondansetron **OR** ondansetron (ZOFRAN) IV   Data Review:   Micro Results No results found for this or any previous visit (from the past 240 hour(s)).  Radiology Reports Dg Chest 2 View  07/17/2015  CLINICAL DATA:   Shortness of breath EXAM: CHEST  2 VIEW COMPARISON:  03/15/2015 FINDINGS: Chronic cardiopericardial enlargement. Small pleural effusions including fissural fluid seen laterally. Diffuse septal thickening with Kerley lines. IMPRESSION: CHF. Electronically Signed   By: Monte Fantasia M.D.   On: 07/17/2015 10:37   US Renal  07/17/2015  CLINICAL DATA:  History of renal failure, acute on chronic, diabetes, hypertension EXAM: RENAL / URINARY TRACT ULTRASOUND COMPLETE COMPARISON:  Ultrasound of the kidneys of 03/19/2015 FINDINGS: Right Kidney: Length: 7.1 cm. No hydronephrosis is seen. The echogenicity of the renal parenchyma is increased consistent with chronic renal medical disease. Within the midportion of the right kidney, there is a hypoechoic structure present of 1.3 x 2.4 x 2.0 cm. This probably represents a cyst, but with no significant through transmission, a solid lesion is difficult to exclude. Consider CT or MRI if further assessment is warranted Left Kidney: Length: 9.1 cm. No hydronephrosis is seen. The echogenicity of the left kidney also is increased consistent with chronic renal medical disease. Bladder: The urinary bladder is unremarkable. Bilateral ureteral jets are visualized. IMPRESSION: 1. No hydronephrosis. 2. Echogenic renal parenchyma bilaterally consistent with chronic renal medical disease. 3. 2.4 cm hypoechoic structure in the mid right kidney not seen previously, with very little through transmission. Consider CT or MRI if further assessment is warranted. Electronically Signed   By: Ivar Drape M.D.   On: 07/17/2015 13:41     CBC  Recent Labs Lab 07/17/15 1129 07/18/15 0503 07/19/15 0421  WBC 8.8 7.9 9.9  HGB 9.0* 8.5* 8.6*  HCT 27.4* 24.8* 25.9*  PLT 192 168 184  MCV 82.8 82.4 84.2  MCH 27.2 28.2 28.0  MCHC 32.9 34.2 33.3  RDW 16.6* 16.0* 16.6*    Chemistries   Recent Labs Lab 07/17/15 1129 07/18/15 0503 07/19/15 0421  NA 139 137  --   K 4.9 4.8 4.8  CL 110 110   --   CO2 20* 20*  --   GLUCOSE 159* 154*  --   BUN 52* 53*  --   CREATININE 4.64* 4.78* 4.97*  CALCIUM 8.3*  7.9*  --   AST 20  --   --   ALT 19  --   --   ALKPHOS 83  --   --   BILITOT 0.6  --   --    ------------------------------------------------------------------------------------------------------------------ estimated creatinine clearance is 16.7 mL/min (by C-G formula based on Cr of 4.97). ------------------------------------------------------------------------------------------------------------------ No results for input(s): HGBA1C in the last 72 hours. ------------------------------------------------------------------------------------------------------------------ No results for input(s): CHOL, HDL, LDLCALC, TRIG, CHOLHDL, LDLDIRECT in the last 72 hours. ------------------------------------------------------------------------------------------------------------------ No results for input(s): TSH, T4TOTAL, T3FREE, THYROIDAB in the last 72 hours.  Invalid input(s): FREET3 ------------------------------------------------------------------------------------------------------------------ No results for input(s): VITAMINB12, FOLATE, FERRITIN, TIBC, IRON, RETICCTPCT in the last 72 hours.  Coagulation profile No results for input(s): INR, PROTIME in the last 168 hours.  No results for input(s): DDIMER in the last 72 hours.  Cardiac Enzymes  Recent Labs Lab 07/17/15 1129  TROPONINI 0.03*   ------------------------------------------------------------------------------------------------------------------ Invalid input(s): POCBNP    Assessment & Plan  Patient is a 55 year old white female with history of diastolic congestive heart failure presents with shortness of breath #1. Acute on chronic diastolic congestive heart failure: Continue IV Lasix Drip Appreciated nephrology help, currently having good diuresis.  Monitor renal function closely.  #2 acute respiratory  failure due to acute CHF continued therapy as above  #3 acute on chronic renal failure with chronic kidney disease stage 3  Renal function slightly worst we'll continue to monitor , appreciate nephrology input  #4 diabetes mellitus type 2: Continue glipizide, add sliding scale with coverage.  #5 diabetes neuropathy: Continue Neurontin.  #6 malignant hypertension . Blood pressure currently improved continue therapy with hydralazine  #7 anemia- likely due to chronic renal failure   Check stool guaiac, continue monitoring.    Code Status Orders        Start     Ordered   07/17/15 1241  Full code   Continuous     07/17/15 1244    Code Status History    Date Active Date Inactive Code Status Order ID Comments User Context   03/15/2015  7:56 PM 03/20/2015  3:48 PM Full Code [id number]  Idelle Crouch, MD Inpatient      Consults  Cardiology and nephrology DVT Prophylaxis  Lovenox  Lab Results  Component Value Date   PLT 184 07/19/2015     Time Spent in minutes   35 minutes Greater than 50% of time spent in care coordination and counseling patient regarding the condition and plan of care.   Vaughan Basta M.D on 07/19/2015 at 2:12 PM  Between 7am to 6pm - Pager - [phone]  After 6pm go to [email or web address] - password [email or web address] [facility]   Office  [phone]

## 2015-07-19 NOTE — Progress Notes (Signed)
Hypoglycemic Event  CBG: 56  Treatment: 15 GM carbohydrate snack  Symptoms: None  Follow-up CBG: Time:1230  CBG Result:78  Possible Reasons for Event: Inadequate meal intake  Patient asymptomatic, sitting up eating lunch now. Will continue to monitor    Terrilyn Saver

## 2015-07-19 NOTE — Progress Notes (Signed)
Hypoglycemic Event  CBG: 63  Treatment: 15 GM carbohydrate snack  Symptoms: None  Follow-up CBG: Time:1649 CBG Result:86  Possible Reasons for Event: Inadequate meal intake     Terrilyn Saver

## 2015-07-20 LAB — BASIC METABOLIC PANEL
ANION GAP: 5 (ref 5–15)
BUN: 59 mg/dL — ABNORMAL HIGH (ref 6–20)
CALCIUM: 7.7 mg/dL — AB (ref 8.9–10.3)
CO2: 21 mmol/L — ABNORMAL LOW (ref 22–32)
Chloride: 108 mmol/L (ref 101–111)
Creatinine, Ser: 5.44 mg/dL — ABNORMAL HIGH (ref 0.44–1.00)
GFR, EST AFRICAN AMERICAN: 9 mL/min — AB (ref >60)
GFR, EST NON AFRICAN AMERICAN: 8 mL/min — AB (ref >60)
Glucose, Bld: 94 mg/dL (ref 65–99)
POTASSIUM: 4.5 mmol/L (ref 3.5–5.1)
SODIUM: 134 mmol/L — AB (ref 135–145)

## 2015-07-20 LAB — GLUCOSE, CAPILLARY
GLUCOSE-CAPILLARY: 102 mg/dL — AB (ref 65–99)
GLUCOSE-CAPILLARY: 44 mg/dL — AB (ref 65–99)
GLUCOSE-CAPILLARY: 85 mg/dL (ref 65–99)
GLUCOSE-CAPILLARY: 109 mg/dL — AB (ref 65–99)
Glucose-Capillary: 147 mg/dL — ABNORMAL HIGH (ref 65–99)

## 2015-07-20 MED ORDER — CEFAZOLIN SODIUM-DEXTROSE 2-4 GM/100ML-% IV SOLN
2.0000 g | INTRAVENOUS | Status: AC
  Filled 2015-07-20: qty 100

## 2015-07-20 NOTE — Progress Notes (Signed)
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 Renal has been following.  A Lasix drip was started yesterday with a modest diuresis of 1400 cc ie 5020 cc an hour -- creatinine is continued to increase  ECG is notable for T-wave inversions and poor R-wave progression  No  previous catheterization;  She has never had a sleep study  Past Medical History  Diagnosis Date  . Essential hypertension   . Diabetes mellitus with renal complications ([place])   . Chronic diastolic CHF (congestive heart failure) ([place])   . CKD (chronic kidney disease), stage III   . Chronic bronchitis ([place])   . Pulmonary hypertension ([place])   . Obesity     Scheduled Meds:  Scheduled Meds: . docusate sodium  100 mg Oral BID  . gabapentin  100 mg Oral BID  . glipiZIDE  5 mg Oral QAC breakfast  . insulin aspart  0-15 Units Subcutaneous TID WC   Continuous Infusions: . furosemide (LASIX) infusion 8 mg/hr (07/19/15 1911)   acetaminophen **OR** acetaminophen, albuterol, diphenhydrAMINE, hydrALAZINE, ondansetron **OR** ondansetron (ZOFRAN) IV    PHYSICAL EXAM Filed Vitals:   07/19/15 1347 07/19/15 1944 07/20/15 0356 07/20/15 1141  BP: 159/64 175/76 154/61 168/67  Pulse: 82 85 85 80  Temp:  98.3 F (36.8 C) 98.9 F (37.2 C) 98 F (36.7 C)  TempSrc:  Oral Oral   Resp:  18 18 20   Height:      Weight:   243 lb 3.2 oz (110.315 kg)   SpO2:  99% 93% 97%   Well developed and nourished in no acute distress HENT normal Neck supple with JVP-unable to discern  Decreased breath sounds L chest with some crackles Regular rate and rhythm, no murmurs or gallops Abd-soft with active BS No Clubbing cyanosis  2+ edema Skin-warm and dry A & Oriented  Grossly normal sensory and motor function   TELEMETRY: Reviewed telemetry pt in nsr:    Intake/Output Summary (Last 24 hours) at 07/20/15 1229 Last data filed at 07/20/15 1143  Gross per 24 hour  Intake    336 ml  Output   1350 ml  Net  -1014 ml    LABS: Basic Metabolic Panel:  Recent Labs Lab 07/17/15 1129 07/18/15 0503 07/19/15 0421 07/20/15 0448  NA 139 137  --  134*  K 4.9 4.8 4.8 4.5  CL 110 110  --  108  CO2 20* 20*  --  21*  GLUCOSE 159* 154*  --  94  BUN 52* 53*  --  59*  CREATININE 4.64* 4.78* 4.97* 5.44*  CALCIUM 8.3* 7.9*  --  7.7*   Cardiac Enzymes: No results for input(s): CKTOTAL, CKMB, CKMBINDEX, TROPONINI in the last 72 hours. CBC:  Recent Labs Lab 07/17/15 1129 07/18/15 0503 07/19/15 0421  WBC 8.8 7.9 9.9  HGB 9.0* 8.5* 8.6*  HCT 27.4* 24.8* 25.9*  MCV 82.8 82.4 84.2  PLT 192 168 184   PROTIME: No results for input(s): LABPROT, INR in the last 72 hours. Liver Function Tests: No results for input(s): AST, ALT, ALKPHOS, BILITOT, PROT, ALBUMIN in the last 72 hours. No results for input(s): LIPASE, AMYLASE in the last  72 hours. BNP: BNP (last 3 results)  Recent Labs  03/15/15 1601 07/17/15 1129  BNP 415.0* 261.0*      ASSESSMENT AND PLAN:  Principal Problem:   Acute on chronic diastolic CHF (congestive heart failure), NYHA class 1 (HCC) Active Problems:   Diabetes mellitus with renal complications (HCC)   Pulmonary hypertension (HCC)   Obesity   Acute renal failure superimposed on stage 3 chronic kidney disease (HCC)   Morbid obesity due to excess calories (HCC)   Anemia in chronic renal disease   Shortness of breath  Cr worsening   Fluid management is deferred to renal.  RRT recommended   The use of metolazone and or torsemide might be of more benefit Reviewing guidelines and the use of Procrit, I find form uptodate that it is>>> ?We recommend against the use of  erythropoiesis-stimulating agents in patients with mild to moderate anemia and HF  I worry ( without knowing ) about the impact of severe RV dysfunction and pulm HTN on her ability to be dialyzed   She needs outpt sleep study and RHC for eval for pulm hypertension with failed RV and still PA pressures of 50      Signed, Virl Axe MD  07/20/2015

## 2015-07-20 NOTE — Progress Notes (Signed)
[facility]                                                                                                                                                                                            Patient Demographics   Jacqueline Rodriguez, is a 55 y.o. female, DOB - 07-25-1960, [id number]  Admit date - 07/17/2015   Admitting Physician Epifanio Lesches, MD  Outpatient Primary MD for the patient is Christie Nottingham., PA   LOS - 3  Subjective:pt Continues to complain of shortness of breath and swelling Patient did not put out much urine. Nephrology started on IV Lasix drip and patient is showing good response to that.   Worsening renal function , and nephro is planning to start HD tomorrow.   Hb dropped Checked- stool guiac was positive.  Review of Systems:   CONSTITUTIONAL: No documented fever. No fatigue, weakness. No weight gain, no weight loss.  EYES: No blurry or double vision.  ENT: No tinnitus. No postnasal drip. No redness of the oropharynx.  RESPIRATORY: No cough, no wheeze, no hemoptysis. No dyspnea.  CARDIOVASCULAR: No chest pain.+ orthopnea. No palpitations. No syncope.  GASTROINTESTINAL: No nausea, no vomiting or diarrhea. No abdominal pain. No melena or hematochezia.  GENITOURINARY: No dysuria or hematuria.  ENDOCRINE: No polyuria or nocturia. No heat or cold intolerance.  HEMATOLOGY: No anemia. No bruising. No bleeding.  INTEGUMENTARY: No rashes. No lesions.  MUSCULOSKELETAL: No arthritis. Positive swelling. No gout.  NEUROLOGIC: No numbness, tingling, or ataxia. No seizure-type activity.  PSYCHIATRIC: No anxiety. No insomnia. No ADD.    Vitals:   Filed Vitals:   07/19/15 1347 07/19/15 1944 07/20/15 0356 07/20/15 1141  BP: 159/64 175/76 154/61 168/67  Pulse: 82 85 85 80  Temp:  98.3 F (36.8 C) 98.9 F (37.2 C) 98 F (36.7 C)  TempSrc:  Oral Oral   Resp:  18 18 20   Height:      Weight:   110.315 kg (243 lb 3.2  oz)   SpO2:  99% 93% 97%    Wt Readings from Last 3 Encounters:  07/20/15 110.315 kg (243 lb 3.2 oz)  04/30/15 97.07 kg (214 lb)  03/25/15 102.059 kg (225 lb)     Intake/Output Summary (Last 24 hours) at 07/20/15 1204 Last data filed at 07/20/15 1143  Gross per 24 hour  Intake    336 ml  Output   1350 ml  Net  -1014 ml    Physical Exam:   GENERAL: Pleasant-appearing in no apparent distress.  HEAD, EYES, EARS, NOSE AND THROAT: Atraumatic, normocephalic. Extraocular muscles  are intact. Pupils equal and reactive to light. Sclerae anicteric. No conjunctival injection. No oro-pharyngeal erythema.  NECK: Supple. There is no jugular venous distention. No bruits, no lymphadenopathy, no thyromegaly.  HEART: Regular rate and rhythm,. No murmurs, no rubs, no clicks.  LUNGS: Clear to auscultation bilaterally. No rales or rhonchi. No wheezes.  ABDOMEN: Soft, flat, nontender, nondistended. Has good bowel sounds. No hepatosplenomegaly appreciated.  EXTREMITIES: No evidence of any cyanosis, clubbing, or  2+ peripheral edema.  +2 pedal and radial pulses bilaterally.  NEUROLOGIC: The patient is alert, awake, and oriented x3 with no focal motor or sensory deficits appreciated bilaterally.  SKIN: Moist and warm with no rashes appreciated.  Psych: Not anxious, depressed LN: No inguinal LN enlargement    Antibiotics   Anti-infectives    None      Medications   Scheduled Meds: . docusate sodium  100 mg Oral BID  . gabapentin  100 mg Oral BID  . glipiZIDE  5 mg Oral QAC breakfast  . insulin aspart  0-15 Units Subcutaneous TID WC   Continuous Infusions: . furosemide (LASIX) infusion 8 mg/hr (07/19/15 1911)   PRN Meds:.acetaminophen **OR** acetaminophen, albuterol, diphenhydrAMINE, hydrALAZINE, ondansetron **OR** ondansetron (ZOFRAN) IV   Data Review:   Micro Results No results found for this or any previous visit (from the past 240 hour(s)).  Radiology Reports Dg Chest 2  View  07/17/2015  CLINICAL DATA:  Shortness of breath EXAM: CHEST  2 VIEW COMPARISON:  03/15/2015 FINDINGS: Chronic cardiopericardial enlargement. Small pleural effusions including fissural fluid seen laterally. Diffuse septal thickening with Kerley lines. IMPRESSION: CHF. Electronically Signed   By: Monte Fantasia M.D.   On: 07/17/2015 10:37   US Renal  07/17/2015  CLINICAL DATA:  History of renal failure, acute on chronic, diabetes, hypertension EXAM: RENAL / URINARY TRACT ULTRASOUND COMPLETE COMPARISON:  Ultrasound of the kidneys of 03/19/2015 FINDINGS: Right Kidney: Length: 7.1 cm. No hydronephrosis is seen. The echogenicity of the renal parenchyma is increased consistent with chronic renal medical disease. Within the midportion of the right kidney, there is a hypoechoic structure present of 1.3 x 2.4 x 2.0 cm. This probably represents a cyst, but with no significant through transmission, a solid lesion is difficult to exclude. Consider CT or MRI if further assessment is warranted Left Kidney: Length: 9.1 cm. No hydronephrosis is seen. The echogenicity of the left kidney also is increased consistent with chronic renal medical disease. Bladder: The urinary bladder is unremarkable. Bilateral ureteral jets are visualized. IMPRESSION: 1. No hydronephrosis. 2. Echogenic renal parenchyma bilaterally consistent with chronic renal medical disease. 3. 2.4 cm hypoechoic structure in the mid right kidney not seen previously, with very little through transmission. Consider CT or MRI if further assessment is warranted. Electronically Signed   By: Ivar Drape M.D.   On: 07/17/2015 13:41     CBC  Recent Labs Lab 07/17/15 1129 07/18/15 0503 07/19/15 0421  WBC 8.8 7.9 9.9  HGB 9.0* 8.5* 8.6*  HCT 27.4* 24.8* 25.9*  PLT 192 168 184  MCV 82.8 82.4 84.2  MCH 27.2 28.2 28.0  MCHC 32.9 34.2 33.3  RDW 16.6* 16.0* 16.6*    Chemistries   Recent Labs Lab 07/17/15 1129 07/18/15 0503 07/19/15 0421  07/20/15 0448  NA 139 137  --  134*  K 4.9 4.8 4.8 4.5  CL 110 110  --  108  CO2 20* 20*  --  21*  GLUCOSE 159* 154*  --  94  BUN 52* 53*  --  59*  CREATININE 4.64* 4.78* 4.97* 5.44*  CALCIUM 8.3* 7.9*  --  7.7*  AST 20  --   --   --   ALT 19  --   --   --   ALKPHOS 83  --   --   --   BILITOT 0.6  --   --   --    ------------------------------------------------------------------------------------------------------------------ estimated creatinine clearance is 15.1 mL/min (by C-G formula based on Cr of 5.44). ------------------------------------------------------------------------------------------------------------------ No results for input(s): HGBA1C in the last 72 hours. ------------------------------------------------------------------------------------------------------------------ No results for input(s): CHOL, HDL, LDLCALC, TRIG, CHOLHDL, LDLDIRECT in the last 72 hours. ------------------------------------------------------------------------------------------------------------------ No results for input(s): TSH, T4TOTAL, T3FREE, THYROIDAB in the last 72 hours.  Invalid input(s): FREET3 ------------------------------------------------------------------------------------------------------------------ No results for input(s): VITAMINB12, FOLATE, FERRITIN, TIBC, IRON, RETICCTPCT in the last 72 hours.  Coagulation profile No results for input(s): INR, PROTIME in the last 168 hours.  No results for input(s): DDIMER in the last 72 hours.  Cardiac Enzymes  Recent Labs Lab 07/17/15 1129  TROPONINI 0.03*   ------------------------------------------------------------------------------------------------------------------ Invalid input(s): POCBNP    Assessment & Plan  Patient is a 55 year old white female with history of diastolic congestive heart failure presents with shortness of breath #1. Acute on chronic diastolic congestive heart failure: Continue IV Lasix  Drip Appreciated nephrology help, currently having good diuresis.  Monitor renal function closely.  #2 acute respiratory failure due to acute CHF continued therapy as above  #3 acute on chronic renal failure with chronic kidney disease stage 3  Renal function slightly worst we'll continue to monitor , appreciate nephrology input   May start on HD tomorrow.  #4 diabetes mellitus type 2: Continue glipizide, add sliding scale with coverage.  #5 diabetes neuropathy: Continue Neurontin.  #6 malignant hypertension . Blood pressure currently improved continue therapy with hydralazine  #7 anemia- likely due to chronic renal failure  positive for stool guaiac, continue monitoring.   GI may not do procedure until renal and cardiac status are stable.     Code Status Orders        Start     Ordered   07/17/15 1241  Full code   Continuous     07/17/15 1244    Code Status History    Date Active Date Inactive Code Status Order ID Comments User Context   03/15/2015  7:56 PM 03/20/2015  3:48 PM Full Code [id number]  Idelle Crouch, MD Inpatient      Consults  Cardiology and nephrology DVT Prophylaxis  Lovenox  Lab Results  Component Value Date   PLT 184 07/19/2015     Time Spent in minutes   35 minutes Greater than 50% of time spent in care coordination and counseling patient regarding the condition and plan of care.   Vaughan Basta M.D on 07/20/2015 at 12:04 PM  Between 7am to 6pm - Pager - [phone]  After 6pm go to [email or web address] - password [email or web address] [facility]   Office  [phone]

## 2015-07-20 NOTE — Consult Note (Signed)
[facility] Vascular Consult Note  MRN : [id number]  Jacqueline Rodriguez is a 55 y.o. (1960/11/30) female who presents with chief complaint of  Chief Complaint  Patient presents with  . Shortness of Breath  .  History of Present Illness: I am asked to see the patient by Dr. Holley Raring for permacath placement. The patient is a 55 y.o. female with a known history ofEssential hypertension, diabetes mellitus type 2, chronic kidney disease stage III comes in because of shortness of breath for 2 days, lower extremity edema getting worse progressively for last few days. Patient does have orthopnea, PND. No chest pain, patient has some cough with clear phlegm. Found to have acute on chronic diastolic heart failure, worsening renal failure with creatinine of 4.64. The NP 261. Saturations are 91% on room air. She uses oxygen at night but now she is using the daytime also. Her renal function has deteriorated and her creatinine has now risen her creatinine clearance is approximately 10 and Dr. Holley Raring feels given the acute on chronic situation dialysis should be initiated.  Current Facility-Administered Medications  Medication Dose Route Frequency Provider Last Rate Last Dose  . acetaminophen (TYLENOL) tablet 650 mg  650 mg Oral Q6H PRN Epifanio Lesches, MD   650 mg at 07/20/15 1050   Or  . acetaminophen (TYLENOL) suppository 650 mg  650 mg Rectal Q6H PRN Epifanio Lesches, MD      . albuterol (PROVENTIL) (2.5 MG/3ML) 0.083% nebulizer solution 2.5 mg  2.5 mg Nebulization Q6H PRN Epifanio Lesches, MD      . diphenhydrAMINE (BENADRYL) capsule 25 mg  25 mg Oral QHS PRN Quintella Baton, MD   25 mg at 07/19/15 2101  . docusate sodium (COLACE) capsule 100 mg  100 mg Oral BID Epifanio Lesches, MD   100 mg at 07/19/15 2101  . furosemide (LASIX) 250 mg in dextrose 5 % 250 mL (1 mg/mL) infusion  8 mg/hr Intravenous Continuous Munsoor Lateef, MD 8 mL/hr at 07/19/15 1911 8 mg/hr at 07/19/15 1911   . gabapentin (NEURONTIN) capsule 100 mg  100 mg Oral BID Epifanio Lesches, MD   100 mg at 07/20/15 1050  . glipiZIDE (GLUCOTROL) tablet 5 mg  5 mg Oral QAC breakfast Epifanio Lesches, MD   5 mg at 07/20/15 0815  . hydrALAZINE (APRESOLINE) injection 20 mg  20 mg Intravenous Q6H PRN Epifanio Lesches, MD   20 mg at 07/20/15 1306  . insulin aspart (novoLOG) injection 0-15 Units  0-15 Units Subcutaneous TID WC Epifanio Lesches, MD   2 Units at 07/18/15 1214  . ondansetron (ZOFRAN) tablet 4 mg  4 mg Oral Q6H PRN Epifanio Lesches, MD       Or  . ondansetron (ZOFRAN) injection 4 mg  4 mg Intravenous Q6H PRN Epifanio Lesches, MD        Past Medical History  Diagnosis Date  . Essential hypertension   . Diabetes mellitus with renal complications ([place])   . Chronic diastolic CHF (congestive heart failure) ([place])   . CKD (chronic kidney disease), stage III   . Chronic bronchitis ([place])   . Pulmonary hypertension ([place])   . Obesity     History reviewed. No pertinent past surgical history.  Social History Social History  Substance Use Topics  . Smoking status: Former Smoker    Types: Cigarettes    Quit date: 03/24/1985  . Smokeless tobacco: Never Used  . Alcohol Use: No    Family History Family History  Problem Relation  Age of Onset  . Hypertension Mother   . Diabetes Mellitus II Mother   . CAD Father   . Hypertension Father   . Heart attack Father   No family history of bleeding clotting disorders, porphyria or autoimmune disease  Allergies  Allergen Reactions  . Ambien [Zolpidem] Other (See Comments)    hallucinations     REVIEW OF SYSTEMS (Negative unless checked)  Constitutional: [] Weight loss  [] Fever  [] Chills Cardiac: [] Chest pain   [] Chest pressure   [] Palpitations   [x] Shortness of breath when laying flat   [x] Shortness of breath at rest   [x] Shortness of breath with exertion. Vascular:  [] Pain in legs with walking   [] Pain in legs at rest   [] Pain in  legs when laying flat   [] Claudication   [] Pain in feet when walking  [] Pain in feet at rest  [] Pain in feet when laying flat   [] History of DVT   [] Phlebitis   [x] Swelling in legs   [] Varicose veins   [] Non-healing ulcers Pulmonary:   [x] Uses home oxygen   [] Productive cough   [] Hemoptysis   [x] Wheeze  [x] COPD   [] Asthma Neurologic:  [] Dizziness  [] Blackouts   [] Seizures   [] History of stroke   [] History of TIA  [] Aphasia   [] Temporary blindness   [] Dysphagia   [] Weakness or numbness in arms   [] Weakness or numbness in legs Musculoskeletal:  [] Arthritis   [] Joint swelling   [] Joint pain   [] Low back pain Hematologic:  [] Easy bruising  [] Easy bleeding   [] Hypercoagulable state   [] Anemic  [] Hepatitis Gastrointestinal:  [] Blood in stool   [] Vomiting blood  [] Gastroesophageal reflux/heartburn   [] Difficulty swallowing. Genitourinary:  [x] Chronic kidney disease   [] Difficult urination  [] Frequent urination  [] Burning with urination   [] Blood in urine Skin:  [] Rashes   [] Ulcers   [] Wounds Psychological:  [] History of anxiety   []  History of major depression.  Physical Examination  Filed Vitals:   07/20/15 0356 07/20/15 1141 07/20/15 1254 07/20/15 1416  BP: 154/61 168/67 171/74 152/55  Pulse: 85 80 83 80  Temp: 98.9 F (37.2 C) 98 F (36.7 C)    TempSrc: Oral     Resp: 18 20    Height:      Weight: 110.315 kg (243 lb 3.2 oz)     SpO2: 93% 97%     Body mass index is 35.9 kg/(m^2). Gen:  WD/WN, NAD Head: [place]/AT, No temporalis wasting. Prominent temp pulse not noted. Ear/Nose/Throat: Hearing grossly intact, nares w/o erythema or drainage, oropharynx w/o Erythema/Exudate Eyes: PERRLA, EOMI.  Neck: Supple, no nuchal rigidity.  No bruit or JVD.  Pulmonary:  Good air movement, clear to auscultation bilaterally.  Cardiac: RRR, normal S1, S2, no Murmurs, rubs or gallops. Vascular:  Vessel Right Left  Radial Palpable Palpable  Ulnar Palpable Palpable  Brachial Palpable Palpable  Carotid  Palpable, without bruit Palpable, without bruit  Aorta Not palpable N/A  Femoral Palpable Palpable  Popliteal Palpable Palpable  PT Palpable Palpable  DP Palpable Palpable   Gastrointestinal: soft, non-tender/non-distended. No guarding/reflex. No masses, surgical incisions, or scars. Musculoskeletal: M/S 5/5 throughout.  Extremities without ischemic changes.  No deformity or atrophy. No edema. Neurologic: CN 2-12 intact. Pain and light touch intact in extremities.  Symmetrical.  Speech is fluent. Motor exam as listed above. Psychiatric: Judgment intact, Mood & affect appropriate for pt's clinical situation. Dermatologic: No rashes or ulcers noted.  No cellulitis or open wounds. Lymph : No Cervical, Axillary, or Inguinal  lymphadenopathy.   CBC Lab Results  Component Value Date   WBC 9.9 07/19/2015   HGB 8.6* 07/19/2015   HCT 25.9* 07/19/2015   MCV 84.2 07/19/2015   PLT 184 07/19/2015    BMET    Component Value Date/Time   NA 134* 07/20/2015 0448   NA 137 12/29/2013 2029   K 4.5 07/20/2015 0448   K 4.3 12/29/2013 2029   CL 108 07/20/2015 0448   CL 104 12/29/2013 2029   CO2 21* 07/20/2015 0448   CO2 25 12/29/2013 2029   GLUCOSE 94 07/20/2015 0448   GLUCOSE 357* 12/29/2013 2029   BUN 59* 07/20/2015 0448   BUN 25* 12/29/2013 2029   CREATININE 5.44* 07/20/2015 0448   CREATININE 1.55* 12/29/2013 2029   CALCIUM 7.7* 07/20/2015 0448   CALCIUM 8.1* 12/29/2013 2029   GFRNONAA 8* 07/20/2015 0448   GFRNONAA 37* 12/29/2013 2029   GFRNONAA 42* 07/13/2013 1941   GFRAA 9* 07/20/2015 0448   GFRAA 45* 12/29/2013 2029   GFRAA 49* 07/13/2013 1941   Estimated Creatinine Clearance: 15.1 mL/min (by C-G formula based on Cr of 5.44).  COAG No results found for: INR, PROTIME  Radiology Dg Chest 2 View  07/17/2015  CLINICAL DATA:  Shortness of breath EXAM: CHEST  2 VIEW COMPARISON:  03/15/2015 FINDINGS: Chronic cardiopericardial enlargement. Small pleural effusions including fissural  fluid seen laterally. Diffuse septal thickening with Kerley lines. IMPRESSION: CHF. Electronically Signed   By: Monte Fantasia M.D.   On: 07/17/2015 10:37   US Renal  07/17/2015  CLINICAL DATA:  History of renal failure, acute on chronic, diabetes, hypertension EXAM: RENAL / URINARY TRACT ULTRASOUND COMPLETE COMPARISON:  Ultrasound of the kidneys of 03/19/2015 FINDINGS: Right Kidney: Length: 7.1 cm. No hydronephrosis is seen. The echogenicity of the renal parenchyma is increased consistent with chronic renal medical disease. Within the midportion of the right kidney, there is a hypoechoic structure present of 1.3 x 2.4 x 2.0 cm. This probably represents a cyst, but with no significant through transmission, a solid lesion is difficult to exclude. Consider CT or MRI if further assessment is warranted Left Kidney: Length: 9.1 cm. No hydronephrosis is seen. The echogenicity of the left kidney also is increased consistent with chronic renal medical disease. Bladder: The urinary bladder is unremarkable. Bilateral ureteral jets are visualized. IMPRESSION: 1. No hydronephrosis. 2. Echogenic renal parenchyma bilaterally consistent with chronic renal medical disease. 3. 2.4 cm hypoechoic structure in the mid right kidney not seen previously, with very little through transmission. Consider CT or MRI if further assessment is warranted. Electronically Signed   By: Ivar Drape M.D.   On: 07/17/2015 13:41    Assessment/Plan #1. acute on chronic renal failure with CK distress 3: Patient to baseline creatinine 2.65, Today it is is 4.64. Nephrology on call Dr. Holley Raring is now planning to start dialysis. Given the circumstances I believe we can place a permacath rather than a temporary catheter. Arrangements will be made for this to be done tomorrow. The risks and benefits were reviewed in detail all questions have been answered patient agrees to proceed.  #2 Acute respiratory failure with hypoxia secondary to CHF  exacerbation: Started on IV Lasix, monitor on telemetry. #3 CHF exacerbation: Patient is now on Lasix drip and cardiology is involved.  #4 diabetes mellitus type 2: Continue glipizide, add sliding scale with coverage. #5 diabetes neuropathy: Continue Neurontin. #6 malignant hypertension Mrs. temperature more than 200s: Patient is on Lasix, hydralazine. Add Metoprolol tartrate 50 mg by  mouth twice a day.   Donney Caraveo, Dolores Lory, MD  07/20/2015 3:08 PM

## 2015-07-20 NOTE — Progress Notes (Signed)
[facility]  ROUNDING NOTE   Subjective:  Renal function continues to worsen with Lasix drip as expected. Urine output was 1.1 L over the preceding 24 hours. We have consulted with vascular surgery for PermCath placement tomorrow. Patient has agreed to proceeding with hemodialysis.  Objective:  Vital signs in last 24 hours:  Temp:  [98 F (36.7 C)-98.9 F (37.2 C)] 98 F (36.7 C) (07/09 1141) Pulse Rate:  [79-85] 80 (07/09 1141) Resp:  [18-20] 20 (07/09 1141) BP: (154-175)/(61-76) 168/67 mmHg (07/09 1141) SpO2:  [93 %-99 %] 97 % (07/09 1141) Weight:  [110.315 kg (243 lb 3.2 oz)] 110.315 kg (243 lb 3.2 oz) (07/09 0356)  Weight change: -1.678 kg (-3 lb 11.2 oz) Filed Weights   07/18/15 0413 07/19/15 0500 07/20/15 0356  Weight: 109.408 kg (241 lb 3.2 oz) 111.993 kg (246 lb 14.4 oz) 110.315 kg (243 lb 3.2 oz)    Intake/Output: I/O last 3 completed shifts: In: 809.3 [P.O.:600; I.V.:209.3] Out: 1950 [Urine:1950]   Intake/Output this shift:  Total I/O In: 0  Out: 400 [Urine:400]  Physical Exam: General: NAD, laying in bed  Head: Normocephalic, atraumatic. Moist oral mucosal membranes  Eyes: Anicteric  Neck: Supple, trachea midline  Lungs:  Basilar rales, normal effort  Heart: Regular rate and rhythm,no obvious rub  Abdomen:  Soft, nontender, BS present  Extremities: 1+ peripheral edema.  Neurologic: Nonfocal, moving all four extremities  Skin: No lesions       Basic Metabolic Panel:  Recent Labs Lab 07/17/15 1129 07/18/15 0503 07/19/15 0421 07/20/15 0448  NA 139 137  --  134*  K 4.9 4.8 4.8 4.5  CL 110 110  --  108  CO2 20* 20*  --  21*  GLUCOSE 159* 154*  --  94  BUN 52* 53*  --  59*  CREATININE 4.64* 4.78* 4.97* 5.44*  CALCIUM 8.3* 7.9*  --  7.7*    Liver Function Tests:  Recent Labs Lab 07/17/15 1129  AST 20  ALT 19  ALKPHOS 83  BILITOT 0.6  PROT 7.1  ALBUMIN 3.0*   No results for input(s): LIPASE, AMYLASE in the last 168  hours. No results for input(s): AMMONIA in the last 168 hours.  CBC:  Recent Labs Lab 07/17/15 1129 07/18/15 0503 07/19/15 0421  WBC 8.8 7.9 9.9  HGB 9.0* 8.5* 8.6*  HCT 27.4* 24.8* 25.9*  MCV 82.8 82.4 84.2  PLT 192 168 184    Cardiac Enzymes:  Recent Labs Lab 07/17/15 1129  TROPONINI 0.03*    BNP: Invalid input(s): POCBNP  CBG:  Recent Labs Lab 07/19/15 1229 07/19/15 1644 07/19/15 2101 07/20/15 0730 07/20/15 1142  GLUCAP 78 77 105* 102* 44*    Microbiology: Results for orders placed or performed during the hospital encounter of 03/15/15  MRSA PCR Screening     Status: None   Collection Time: 03/15/15  7:56 PM  Result Value Ref Range Status   MRSA by PCR NEGATIVE NEGATIVE Final    Comment:        The GeneXpert MRSA Assay (FDA approved for NASAL specimens only), is one component of a comprehensive MRSA colonization surveillance program. It is not intended to diagnose MRSA infection nor to guide or monitor treatment for MRSA infections.     Coagulation Studies: No results for input(s): LABPROT, INR in the last 72 hours.  Urinalysis: No results for input(s): COLORURINE, LABSPEC, PHURINE, GLUCOSEU, HGBUR, BILIRUBINUR, KETONESUR, PROTEINUR, UROBILINOGEN, NITRITE, LEUKOCYTESUR in the last 72 hours.  Invalid   input(s): APPERANCEUR    Imaging: No results found.   Medications:   . furosemide (LASIX) infusion 8 mg/hr (07/19/15 1911)   . docusate sodium  100 mg Oral BID  . gabapentin  100 mg Oral BID  . glipiZIDE  5 mg Oral QAC breakfast  . insulin aspart  0-15 Units Subcutaneous TID WC   acetaminophen **OR** acetaminophen, albuterol, diphenhydrAMINE, hydrALAZINE, ondansetron **OR** ondansetron (ZOFRAN) IV  Assessment/ Plan:  55 y.o. female  with diastolic congestive heart failure, hypertension, diabetes mellitus type II, diabetic retinopathy, diabetic neuropathy, CKD stage IV/V  1.  Acute renal failure/chronic kidney disease stage IV/V:  The  patient has had rather progressive decline in renal function recently.  Most recent outpatient EGFR was 12.  Patient appears to be having signs of volume overload.   -  Patient has only had a modest response to Lasix drip. Therefore she will require renal replacement therapy for additional volume controlled. We have consulted with vascular surgery to place PermCath tomorrow. Thereafter she will require placement in an outpatient dialysis unit.  Dr. Juleen China to follow up tomorrow.  2.  Pulmonary edema. Patient does report that her shortness of breath has improved a bit. Continue Lasix drip for now until dialysis can be started.  3.  Anemia chronic kidney disease.  Consider Epogen once we start dialysis.  4.  Secondary hyperparathyroidism.  We plan to reevaluate bone mineral metabolism parameters once she is on dialysis.  Further plan based upon these labs.     LOS: 3 Alexie Lanni 7/9/201712:02 PM

## 2015-07-20 NOTE — Progress Notes (Signed)
Hypoglycemic Event  CBG: 44  Treatment: 15 GM carbohydrate snack  Symptoms: None  Follow-up CBG: Time: 1210 CBG Result: 87  Possible Reasons for Event: Inadequate meal intake  Asymptomatic, about to eat lunch.     Terrilyn Saver

## 2015-07-20 NOTE — Plan of Care (Signed)
Problem: Fluid Volume: Goal: Ability to maintain a balanced intake and output will improve Outcome: Not Progressing Output has decreased while on lasix drip, patient is being prepared for hemodialysis.

## 2015-07-20 NOTE — Consult Note (Signed)
Discussed case briefly with Dr. Anselm Jungling, due to multiple signif medical problems the heme positive stool is not an urgent matter.  Full consult to follow tomorrow.  After CHF and CRF improved likely will need EGD and colonoscopy

## 2015-07-21 ENCOUNTER — Encounter
Admission: EM | Disposition: A | Discharge status: Home / Self Care | Payer: Self-pay | Source: Home / Self Care | Attending: Internal Medicine

## 2015-07-21 HISTORY — PX: PERIPHERAL VASCULAR CATHETERIZATION: SHX172C

## 2015-07-21 LAB — GLUCOSE, CAPILLARY
GLUCOSE-CAPILLARY: 92 mg/dL (ref 65–99)
GLUCOSE-CAPILLARY: 206 mg/dL — AB (ref 65–99)
Glucose-Capillary: 94 mg/dL (ref 65–99)
Glucose-Capillary: 155 mg/dL — ABNORMAL HIGH (ref 65–99)

## 2015-07-21 LAB — CBC
HEMATOCRIT: 25.6 % — AB (ref 35.0–47.0)
HEMOGLOBIN: 8.6 g/dL — AB (ref 12.0–16.0)
MCH: 28.1 pg (ref 26.0–34.0)
MCHC: 33.4 g/dL (ref 32.0–36.0)
MCV: 84.2 fL (ref 80.0–100.0)
Platelets: 181 10*3/uL (ref 150–440)
RBC: 3.04 MIL/uL — ABNORMAL LOW (ref 3.80–5.20)
RDW: 16.3 % — ABNORMAL HIGH (ref 11.5–14.5)
WBC: 8.4 10*3/uL (ref 3.6–11.0)

## 2015-07-21 LAB — BASIC METABOLIC PANEL
Anion gap: 8 (ref 5–15)
BUN: 59 mg/dL — ABNORMAL HIGH (ref 6–20)
CHLORIDE: 103 mmol/L (ref 101–111)
CO2: 22 mmol/L (ref 22–32)
CREATININE: 5.54 mg/dL — AB (ref 0.44–1.00)
Calcium: 7.8 mg/dL — ABNORMAL LOW (ref 8.9–10.3)
GFR calc non Af Amer: 8 mL/min — ABNORMAL LOW (ref >60)
GFR, EST AFRICAN AMERICAN: 9 mL/min — AB (ref >60)
GLUCOSE: 104 mg/dL — AB (ref 65–99)
Potassium: 4.4 mmol/L (ref 3.5–5.1)
Sodium: 133 mmol/L — ABNORMAL LOW (ref 135–145)

## 2015-07-21 LAB — PROTIME-INR
INR: 1.12
Prothrombin Time: 14.6 seconds (ref 11.4–15.0)

## 2015-07-21 SURGERY — DIALYSIS/PERMA CATHETER REPAIR
Anesthesia: IV Sedation (MBSC Only)

## 2015-07-21 MED ORDER — MIDAZOLAM HCL 2 MG/2ML IJ SOLN
INTRAMUSCULAR | Status: DC | PRN
  Administered 2015-07-21: 2 mg via INTRAVENOUS
  Administered 2015-07-21: 01:00:00

## 2015-07-21 MED ORDER — LIDOCAINE-EPINEPHRINE (PF) 1 %-1:200000 IJ SOLN
INTRAMUSCULAR | Status: AC
  Filled 2015-07-21: qty 30

## 2015-07-21 MED ORDER — MIDAZOLAM HCL 5 MG/5ML IJ SOLN
INTRAMUSCULAR | Status: AC
  Filled 2015-07-21: qty 5

## 2015-07-21 MED ORDER — FENTANYL CITRATE (PF) 100 MCG/2ML IJ SOLN
INTRAMUSCULAR | Status: AC
  Filled 2015-07-21: qty 2

## 2015-07-21 MED ORDER — HEPARIN (PORCINE) IN NACL 2-0.9 UNIT/ML-% IJ SOLN
INTRAMUSCULAR | Status: AC
  Filled 2015-07-21: qty 500

## 2015-07-21 MED ORDER — MIDAZOLAM HCL 2 MG/2ML IJ SOLN
INTRAMUSCULAR | Status: AC | PRN
  Administered 2015-07-21: 1 mg via INTRAVENOUS
  Administered 2015-07-21: 2 mg via INTRAVENOUS

## 2015-07-21 MED ORDER — FENTANYL CITRATE (PF) 100 MCG/2ML IJ SOLN
50.0000 ug | Freq: Once | INTRAMUSCULAR | Status: AC
  Administered 2015-07-21: 50 ug via INTRAVENOUS

## 2015-07-21 MED ORDER — CARVEDILOL 6.25 MG PO TABS
6.2500 mg | ORAL_TABLET | Freq: Two times a day (BID) | ORAL | Status: DC
  Administered 2015-07-21 – 2015-07-23 (×4): 6.25 mg via ORAL
  Filled 2015-07-21 (×4): qty 1

## 2015-07-21 MED ORDER — AMLODIPINE BESYLATE 5 MG PO TABS
2.5000 mg | ORAL_TABLET | Freq: Every day | ORAL | Status: DC
  Administered 2015-07-22: 2.5 mg via ORAL
  Filled 2015-07-21: qty 1

## 2015-07-21 MED ORDER — HEPARIN SODIUM (PORCINE) 10000 UNIT/ML IJ SOLN
INTRAMUSCULAR | Status: AC
  Filled 2015-07-21: qty 1

## 2015-07-21 MED ORDER — FENTANYL CITRATE (PF) 100 MCG/2ML IJ SOLN
INTRAMUSCULAR | Status: AC | PRN
  Administered 2015-07-21: 50 ug via INTRAVENOUS

## 2015-07-21 MED ORDER — DEXTROSE 5 % IV SOLN
1.5000 g | Freq: Once | INTRAVENOUS | Status: AC
  Administered 2015-07-21: 1.5 g via INTRAVENOUS

## 2015-07-21 SURGICAL SUPPLY — 9 items
BIOPATCH RED 1 DISK 7.0 (GAUZE/BANDAGES/DRESSINGS) ×2 IMPLANT
BIOPATCH RED 1IN DISK 7.0MM (GAUZE/BANDAGES/DRESSINGS) ×1
CATH PALINDROME RT-P 15FX19CM (CATHETERS) ×3 IMPLANT
DERMABOND ADVANCED (GAUZE/BANDAGES/DRESSINGS) ×2
DERMABOND ADVANCED .7 DNX12 (GAUZE/BANDAGES/DRESSINGS) ×1 IMPLANT
PACK ANGIOGRAPHY (CUSTOM PROCEDURE TRAY) ×3 IMPLANT
SUT MNCRL AB 4-0 PS2 18 (SUTURE) ×3 IMPLANT
SUT PROLENE 0 CT 1 30 (SUTURE) ×3 IMPLANT
TOWEL OR 17X26 4PK STRL BLUE (TOWEL DISPOSABLE) ×3 IMPLANT

## 2015-07-21 NOTE — Progress Notes (Signed)
Inpatient Diabetes Program Recommendations  AACE/ADA: New Consensus Statement on Inpatient Glycemic Control (2015)  Target Ranges:  Prepandial:   less than 140 mg/dL      Peak postprandial:   less than 180 mg/dL (1-2 hours)      Critically ill patients:  140 - 180 mg/dL  Results for BURNICE, KURZAWA (MRN [id number]) as of 07/21/2015 09:15  Ref. Range 07/20/2015 07:30 07/20/2015 11:42 07/20/2015 12:13 07/20/2015 16:44 07/20/2015 20:43 07/21/2015 07:38  Glucose-Capillary Latest Ref Range: 65-99 mg/dL 102 (H) 44 (LL) 85 109 (H) 147 (H) 94  Results for JAQUELA, SHAKER (MRN [id number]) as of 07/21/2015 09:15  Ref. Range 07/19/2015 07:32 07/19/2015 11:17 07/19/2015 12:05 07/19/2015 12:29 07/19/2015 16:44 07/19/2015 21:01  Glucose-Capillary Latest Ref Range: 65-99 mg/dL 101 (H) 56 (L) 59 (L) 78 77 105 (H)    Review of Glycemic Control  Diabetes history: DM2 Outpatient Diabetes medications: Glipizide 5 mg QAM Current orders for Inpatient glycemic control: Glipizide 5 mg QAM, Novolog 0-15 units TID with meals  Inpatient Diabetes Program Recommendations: Oral Agents: Note patient is NPO and has experienced hypoglycemia for the past 2 days. Please consider discontinuing Glipizide while inpatient and use Novolog correction if needed as ordered.  Thanks, Barnie Alderman, RN, MSN, CDE Diabetes Coordinator Inpatient Diabetes Program [phone] (Team Pager from [place] to [place]) [phone] ([facility] office) [phone] [facility] office) [phone] [facility] office)

## 2015-07-21 NOTE — Progress Notes (Signed)
[facility]  ROUNDING NOTE   Subjective:   UOP 1400 on furosemide gtt   Permcath for later today.   Objective:  Vital signs in last 24 hours:  Temp:  [97.5 F (36.4 C)-98.3 F (36.8 C)] 98.2 F (36.8 C) (07/10 1118) Pulse Rate:  [78-86] 80 (07/10 1118) Resp:  [14-20] 18 (07/10 1118) BP: (144-177)/(48-74) 144/48 mmHg (07/10 1118) SpO2:  [94 %-99 %] 96 % (07/10 1118) Weight:  [110.088 kg (242 lb 11.2 oz)] 110.088 kg (242 lb 11.2 oz) (07/10 0434)  Weight change: -0.227 kg (-8 oz) Filed Weights   07/19/15 0500 07/20/15 0356 07/21/15 0434  Weight: 111.993 kg (246 lb 14.4 oz) 110.315 kg (243 lb 3.2 oz) 110.088 kg (242 lb 11.2 oz)    Intake/Output: I/O last 3 completed shifts: In: 729.7 [P.O.:480; I.V.:249.7] Out: 2250 [Urine:2250]   Intake/Output this shift:  Total I/O In: -  Out: 200 [Urine:200]  Physical Exam: General: NAD, laying in bed  Head: Normocephalic, atraumatic. Moist oral mucosal membranes  Eyes: Anicteric  Neck: Supple, trachea midline  Lungs:  Clear bilaterally  Heart: Regular rate and rhythm  Abdomen:  Soft, nontender, BS present  Extremities: 1+ peripheral edema.  Neurologic: Nonfocal, moving all four extremities  Skin: No lesions       Basic Metabolic Panel:  Recent Labs Lab 07/17/15 1129 07/18/15 0503 07/19/15 0421 07/20/15 0448 07/21/15 0424  NA 139 137  --  134* 133*  K 4.9 4.8 4.8 4.5 4.4  CL 110 110  --  108 103  CO2 20* 20*  --  21* 22  GLUCOSE 159* 154*  --  94 104*  BUN 52* 53*  --  59* 59*  CREATININE 4.64* 4.78* 4.97* 5.44* 5.54*  CALCIUM 8.3* 7.9*  --  7.7* 7.8*    Liver Function Tests:  Recent Labs Lab 07/17/15 1129  AST 20  ALT 19  ALKPHOS 83  BILITOT 0.6  PROT 7.1  ALBUMIN 3.0*   No results for input(s): LIPASE, AMYLASE in the last 168 hours. No results for input(s): AMMONIA in the last 168 hours.  CBC:  Recent Labs Lab 07/17/15 1129 07/18/15 0503 07/19/15 0421 07/21/15 0424  WBC 8.8  7.9 9.9 8.4  HGB 9.0* 8.5* 8.6* 8.6*  HCT 27.4* 24.8* 25.9* 25.6*  MCV 82.8 82.4 84.2 84.2  PLT 192 168 184 181    Cardiac Enzymes:  Recent Labs Lab 07/17/15 1129  TROPONINI 0.03*    BNP: Invalid input(s): POCBNP  CBG:  Recent Labs Lab 07/20/15 1142 07/20/15 1213 07/20/15 1644 07/20/15 2043 07/21/15 0738  GLUCAP 44* 85 109* 147* 94    Microbiology: Results for orders placed or performed during the hospital encounter of 03/15/15  MRSA PCR Screening     Status: None   Collection Time: 03/15/15  7:56 PM  Result Value Ref Range Status   MRSA by PCR NEGATIVE NEGATIVE Final    Comment:        The GeneXpert MRSA Assay (FDA approved for NASAL specimens only), is one component of a comprehensive MRSA colonization surveillance program. It is not intended to diagnose MRSA infection nor to guide or monitor treatment for MRSA infections.     Coagulation Studies:  Recent Labs  07/21/15 0424  LABPROT 14.6  INR 1.12    Urinalysis: No results for input(s): COLORURINE, LABSPEC, PHURINE, GLUCOSEU, HGBUR, BILIRUBINUR, KETONESUR, PROTEINUR, UROBILINOGEN, NITRITE, LEUKOCYTESUR in the last 72 hours.  Invalid input(s): APPERANCEUR    Imaging: No results found.  Medications:   . furosemide (LASIX) infusion 8 mg/hr (07/20/15 2250)   . [MAR Hold] amLODipine  2.5 mg Oral Daily  . [MAR Hold] carvedilol  6.25 mg Oral BID WC  .  ceFAZolin (ANCEF) IV  2 g Intravenous On Call to OR  . [MAR Hold] docusate sodium  100 mg Oral BID  . [MAR Hold] gabapentin  100 mg Oral BID  . [MAR Hold] glipiZIDE  5 mg Oral QAC breakfast  . [MAR Hold] insulin aspart  0-15 Units Subcutaneous TID WC   [MAR Hold] acetaminophen **OR** [MAR Hold] acetaminophen, [MAR Hold] albuterol, [MAR Hold] diphenhydrAMINE, [MAR Hold] hydrALAZINE, [MAR Hold] ondansetron **OR** [MAR Hold] ondansetron (ZOFRAN) IV  Assessment/ Plan:  55 y.o. female  with diastolic congestive heart failure, hypertension,  diabetes mellitus type II, diabetic retinopathy, diabetic neuropathy, CKD stage IV/V  1.  Acute renal failure on chronic kidney disease stage IV/V:  Progressive decline in renal function in last few months. Now with worsening renal function and concern for requiring dialysis.  - permcath for later today.  - continue furosemide gtt for another day - hepatitis screen today - Plan on starting hemodialysis treatment tomorrow.   2.  Hypertension/Diastolic Dysfunction/Pulmonary edema:  - furosemide gtt for another day.  - amlodipine and carvedilol.   3.  Anemia chronic kidney disease.  Consider Epogen once we start dialysis.   LOS: [place], [place] 7/10/201711:32 AM

## 2015-07-21 NOTE — Progress Notes (Signed)
[place] at [facility]                                                                                                                                                                                            Patient Demographics   Jacqueline Rodriguez, is a 55 y.o. female, DOB - 01-Feb-1960, [id number]  Admit date - 07/17/2015   Admitting Physician Epifanio Lesches, MD  Outpatient Primary MD for the patient is Christie Nottingham., PA   LOS - 4  Subjective:pt Continues to complain of shortness of breath and swelling Patient did not put out much urine. Nephrology started on IV Lasix drip and patient is showing good response to that.   Worsening renal function , and nephro is planning to start HD tomorrow.   Hb dropped Checked- stool guiac was positive.   S/p permacath placement 07/21/15.  Review of Systems:   CONSTITUTIONAL: No documented fever. No fatigue, weakness. No weight gain, no weight loss.  EYES: No blurry or double vision.  ENT: No tinnitus. No postnasal drip. No redness of the oropharynx.  RESPIRATORY: No cough, no wheeze, no hemoptysis. No dyspnea.  CARDIOVASCULAR: No chest pain.+ orthopnea. No palpitations. No syncope.  GASTROINTESTINAL: No nausea, no vomiting or diarrhea. No abdominal pain. No melena or hematochezia.  GENITOURINARY: No dysuria or hematuria.  ENDOCRINE: No polyuria or nocturia. No heat or cold intolerance.  HEMATOLOGY: No anemia. No bruising. No bleeding.  INTEGUMENTARY: No rashes. No lesions.  MUSCULOSKELETAL: No arthritis. Positive swelling. No gout.  NEUROLOGIC: No numbness, tingling, or ataxia. No seizure-type activity.  PSYCHIATRIC: No anxiety. No insomnia. No ADD.    Vitals:   Filed Vitals:   07/21/15 1259 07/21/15 1355 07/21/15 1700 07/21/15 1924  BP: 186/71 145/67 160/71 134/56  Pulse: 88 82 79 74  Temp:  98 F (36.7 C)  98.1 F (36.7 C)  TempSrc:  Oral  Oral  Resp: 13 18  16   Height:      Weight:       SpO2: 99% 100%  96%    Wt Readings from Last 3 Encounters:  07/21/15 110.088 kg (242 lb 11.2 oz)  04/30/15 97.07 kg (214 lb)  03/25/15 102.059 kg (225 lb)     Intake/Output Summary (Last 24 hours) at 07/21/15 2100 Last data filed at 07/21/15 1810  Gross per 24 hour  Intake     64 ml  Output   2000 ml  Net  -1936 ml    Physical Exam:   GENERAL: Pleasant-appearing in no apparent distress.  HEAD, EYES, EARS, NOSE AND THROAT: Atraumatic, normocephalic. Extraocular muscles are  intact. Pupils equal and reactive to light. Sclerae anicteric. No conjunctival injection. No oro-pharyngeal erythema.  NECK: Supple. There is no jugular venous distention. No bruits, no lymphadenopathy, no thyromegaly.  HEART: Regular rate and rhythm,. No murmurs, no rubs, no clicks.  LUNGS: Clear to auscultation bilaterally. No rales or rhonchi. No wheezes.  ABDOMEN: Soft, flat, nontender, nondistended. Has good bowel sounds. No hepatosplenomegaly appreciated.  EXTREMITIES: No evidence of any cyanosis, clubbing, or  2+ peripheral edema.  +2 pedal and radial pulses bilaterally.  NEUROLOGIC: The patient is alert, awake, and oriented x3 with no focal motor or sensory deficits appreciated bilaterally.  SKIN: Moist and warm with no rashes appreciated.  Psych: Not anxious, depressed LN: No inguinal LN enlargement    Antibiotics   Anti-infectives    Start     Dose/Rate Route Frequency Ordered Stop   07/21/15 1215  cefUROXime (ZINACEF) 1.5 g in dextrose 5 % 50 mL IVPB     1.5 g 100 mL/hr over 30 Minutes Intravenous  Once 07/21/15 1214 07/21/15 1245   07/21/15 0600  ceFAZolin (ANCEF) IVPB 2g/100 mL premix     2 g 200 mL/hr over 30 Minutes Intravenous On call to O.R. 07/20/15 1515 07/22/15 0559      Medications   Scheduled Meds: . amLODipine  2.5 mg Oral Daily  . carvedilol  6.25 mg Oral BID WC  .  ceFAZolin (ANCEF) IV  2 g Intravenous On Call to OR  . docusate sodium  100 mg Oral BID  . gabapentin  100  mg Oral BID  . glipiZIDE  5 mg Oral QAC breakfast  . insulin aspart  0-15 Units Subcutaneous TID WC   Continuous Infusions: . furosemide (LASIX) infusion 8 mg/hr (07/20/15 2250)   PRN Meds:.acetaminophen **OR** acetaminophen, albuterol, diphenhydrAMINE, hydrALAZINE, ondansetron **OR** ondansetron (ZOFRAN) IV   Data Review:   Micro Results No results found for this or any previous visit (from the past 240 hour(s)).  Radiology Reports Dg Chest 2 View  07/17/2015  CLINICAL DATA:  Shortness of breath EXAM: CHEST  2 VIEW COMPARISON:  03/15/2015 FINDINGS: Chronic cardiopericardial enlargement. Small pleural effusions including fissural fluid seen laterally. Diffuse septal thickening with Kerley lines. IMPRESSION: CHF. Electronically Signed   By: Monte Fantasia M.D.   On: 07/17/2015 10:37   US Renal  07/17/2015  CLINICAL DATA:  History of renal failure, acute on chronic, diabetes, hypertension EXAM: RENAL / URINARY TRACT ULTRASOUND COMPLETE COMPARISON:  Ultrasound of the kidneys of 03/19/2015 FINDINGS: Right Kidney: Length: 7.1 cm. No hydronephrosis is seen. The echogenicity of the renal parenchyma is increased consistent with chronic renal medical disease. Within the midportion of the right kidney, there is a hypoechoic structure present of 1.3 x 2.4 x 2.0 cm. This probably represents a cyst, but with no significant through transmission, a solid lesion is difficult to exclude. Consider CT or MRI if further assessment is warranted Left Kidney: Length: 9.1 cm. No hydronephrosis is seen. The echogenicity of the left kidney also is increased consistent with chronic renal medical disease. Bladder: The urinary bladder is unremarkable. Bilateral ureteral jets are visualized. IMPRESSION: 1. No hydronephrosis. 2. Echogenic renal parenchyma bilaterally consistent with chronic renal medical disease. 3. 2.4 cm hypoechoic structure in the mid right kidney not seen previously, with very little through transmission.  Consider CT or MRI if further assessment is warranted. Electronically Signed   By: Ivar Drape M.D.   On: 07/17/2015 13:41     CBC  Recent Labs Lab 07/17/15  1129 07/18/15 0503 07/19/15 0421 07/21/15 0424  WBC 8.8 7.9 9.9 8.4  HGB 9.0* 8.5* 8.6* 8.6*  HCT 27.4* 24.8* 25.9* 25.6*  PLT 192 168 184 181  MCV 82.8 82.4 84.2 84.2  MCH 27.2 28.2 28.0 28.1  MCHC 32.9 34.2 33.3 33.4  RDW 16.6* 16.0* 16.6* 16.3*    Chemistries   Recent Labs Lab 07/17/15 1129 07/18/15 0503 07/19/15 0421 07/20/15 0448 07/21/15 0424  NA 139 137  --  134* 133*  K 4.9 4.8 4.8 4.5 4.4  CL 110 110  --  108 103  CO2 20* 20*  --  21* 22  GLUCOSE 159* 154*  --  94 104*  BUN 52* 53*  --  59* 59*  CREATININE 4.64* 4.78* 4.97* 5.44* 5.54*  CALCIUM 8.3* 7.9*  --  7.7* 7.8*  AST 20  --   --   --   --   ALT 19  --   --   --   --   ALKPHOS 83  --   --   --   --   BILITOT 0.6  --   --   --   --    ------------------------------------------------------------------------------------------------------------------ estimated creatinine clearance is 14.8 mL/min (by C-G formula based on Cr of 5.54). ------------------------------------------------------------------------------------------------------------------ No results for input(s): HGBA1C in the last 72 hours. ------------------------------------------------------------------------------------------------------------------ No results for input(s): CHOL, HDL, LDLCALC, TRIG, CHOLHDL, LDLDIRECT in the last 72 hours. ------------------------------------------------------------------------------------------------------------------ No results for input(s): TSH, T4TOTAL, T3FREE, THYROIDAB in the last 72 hours.  Invalid input(s): FREET3 ------------------------------------------------------------------------------------------------------------------ No results for input(s): VITAMINB12, FOLATE, FERRITIN, TIBC, IRON, RETICCTPCT in the last 72 hours.  Coagulation  profile  Recent Labs Lab 07/21/15 0424  INR 1.12    No results for input(s): DDIMER in the last 72 hours.  Cardiac Enzymes  Recent Labs Lab 07/17/15 1129  TROPONINI 0.03*   ------------------------------------------------------------------------------------------------------------------ Invalid input(s): POCBNP    Assessment & Plan  Patient is a 55 year old white female with history of diastolic congestive heart failure presents with shortness of breath #1. Acute on chronic diastolic congestive heart failure: Continue IV Lasix Drip Appreciated nephrology help, currently having good diuresis.  Monitor renal function closely. Gradual worsening.  #2 acute respiratory failure due to acute CHF continued therapy as above  #3 acute on chronic renal failure with chronic kidney disease stage 3  Renal function slightly worst we'll continue to monitor , appreciate nephrology input.  s/p permacath 07/21/15. May start on HD tomorrow.  #4 diabetes mellitus type 2: Continue glipizide, add sliding scale with coverage.  #5 diabetes neuropathy: Continue Neurontin.  #6 malignant hypertension . Blood pressure currently improved continue therapy with hydralazine  #7 anemia- likely due to chronic renal failure  positive for stool guaiac, continue monitoring.   GI may not do procedure until renal and cardiac status are stable.     Code Status Orders        Start     Ordered   07/17/15 1241  Full code   Continuous     07/17/15 1244    Code Status History    Date Active Date Inactive Code Status Order ID Comments User Context   03/15/2015  7:56 PM 03/20/2015  3:48 PM Full Code [id number]  Idelle Crouch, MD Inpatient      Consults  Cardiology and nephrology DVT Prophylaxis  Lovenox  Lab Results  Component Value Date   PLT 181 07/21/2015     Time Spent in minutes   35 minutes Greater than  50% of time spent in care coordination and counseling patient regarding the condition and  plan of care.   Vaughan Basta M.D on 07/21/2015 at 9:00 PM  Between 7am to 6pm - Pager - [phone]  After 6pm go to [email or web address] - password [email or web address] [facility]   Office  [phone]

## 2015-07-21 NOTE — Care Management Note (Signed)
I am gathering patients medical records for possible need for outpatient dialysis at discharge.  Iran Sizer  Dialysis Liaison  [phone]

## 2015-07-21 NOTE — Consult Note (Signed)
Consultation  Referring Provider: Dr. Anselm Jungling Primary Care Physician:  Christie Nottingham., PA Consulting  Gastroenterologist:  Dr. Vira Agar       Reason for Consultation: Heme positive stool           HPI:   Jacqueline Rodriguez is a 55 y.o. female  with a known history of essential hypertension, diabetes mellitus type 2, chronic kidney disease stage III was admitted from the ED on 07/17/2015 for  shortness of breath for 2 days, lower extremity edema getting worse progressively for last few days. Patient does have orthopnea, PND. No chest pain, patient has some cough with clear phlegm. She was found to have acute on chronic diastolic heart failure, worsening renal failure with creatinine of 4.64. The BNP 261. Saturations are 91% on room air. She uses oxygen at night but now she is using the daytime also. She had a Hgb  11.6 to 10.4  four mos ago and now Hgb  9.0-  8.5 with  known HX of anemia from chronic kidney disease. She was hemoccult positive and GI is asked to see her. She has been in the surgery unit for Permacath placement. Positive DM with Hgb A1c 8.2.  She denies any UGI or LGI complaints. She has known hemorrhoids but has noted no blood or melena- although she admits that she does not look at her stool. No GERD symptoms. No prior screening colonoscopy or EGD. She refuses a digital rectal exam. She presents drowsy from her surgery and falls asleep during the interview repeatedly.    Past Medical History  Diagnosis Date  . Essential hypertension   . Diabetes mellitus with renal complications ([place])   . Chronic diastolic CHF (congestive heart failure) ([place])   . CKD (chronic kidney disease), stage III   . Chronic bronchitis ([place])   . Pulmonary hypertension ([place])   . Obesity     History reviewed. No pertinent past surgical history.  Family History  Problem Relation Age of Onset  . Hypertension Mother   . Diabetes Mellitus II Mother   . CAD Father   . Hypertension Father   . Heart attack  Father      Social History  Substance Use Topics  . Smoking status: Former Smoker    Types: Cigarettes    Quit date: 03/24/1985  . Smokeless tobacco: Never Used  . Alcohol Use: No    Prior to Admission medications   Medication Sig Start Date End Date Taking? Authorizing Provider  albuterol (PROVENTIL HFA;VENTOLIN HFA) 108 (90 Base) MCG/ACT inhaler Inhale 2 puffs into the lungs every 6 (six) hours as needed for wheezing or shortness of breath. 01/08/15  Yes Gregor Hams, MD  aspirin EC 81 MG EC tablet Take 1 tablet (81 mg total) by mouth daily. 03/20/15  Yes Vaughan Basta, MD  gabapentin (NEURONTIN) 100 MG capsule Take 100 mg by mouth 3 (three) times daily as needed. For neuropathy. 03/05/15  Yes Historical Provider, MD  glipiZIDE (GLUCOTROL) 5 MG tablet Take 5 mg by mouth daily before breakfast.    Yes Historical Provider, MD  lisinopril (PRINIVIL,ZESTRIL) 5 MG tablet Take 5 mg by mouth daily.   Yes Historical Provider, MD    Current Facility-Administered Medications  Medication Dose Route Frequency Provider Last Rate Last Dose  . acetaminophen (TYLENOL) tablet 650 mg  650 mg Oral Q6H PRN Epifanio Lesches, MD   650 mg at 07/20/15 1916   Or  . acetaminophen (TYLENOL) suppository 650 mg  650 mg Rectal  Q6H PRN Epifanio Lesches, MD      . albuterol (PROVENTIL) (2.5 MG/3ML) 0.083% nebulizer solution 2.5 mg  2.5 mg Nebulization Q6H PRN Epifanio Lesches, MD      . amLODipine (NORVASC) tablet 2.5 mg  2.5 mg Oral Daily Wellington Hampshire, MD   2.5 mg at 07/21/15 0906  . carvedilol (COREG) tablet 6.25 mg  6.25 mg Oral BID WC Wellington Hampshire, MD   6.25 mg at 07/21/15 0840  . ceFAZolin (ANCEF) IVPB 2g/100 mL premix  2 g Intravenous On Call to OR Katha Cabal, MD      . diphenhydrAMINE (BENADRYL) capsule 25 mg  25 mg Oral QHS PRN Debby Crosley, MD   25 mg at 07/20/15 2200  . docusate sodium (COLACE) capsule 100 mg  100 mg Oral BID Epifanio Lesches, MD   100 mg at 07/19/15  2101  . furosemide (LASIX) 250 mg in dextrose 5 % 250 mL (1 mg/mL) infusion  8 mg/hr Intravenous Continuous Munsoor Lateef, MD 8 mL/hr at 07/20/15 2250 8 mg/hr at 07/20/15 2250  . gabapentin (NEURONTIN) capsule 100 mg  100 mg Oral BID Epifanio Lesches, MD   100 mg at 07/20/15 2200  . glipiZIDE (GLUCOTROL) tablet 5 mg  5 mg Oral QAC breakfast Epifanio Lesches, MD   5 mg at 07/20/15 0815  . hydrALAZINE (APRESOLINE) injection 20 mg  20 mg Intravenous Q6H PRN Epifanio Lesches, MD   20 mg at 07/20/15 1306  . insulin aspart (novoLOG) injection 0-15 Units  0-15 Units Subcutaneous TID WC Epifanio Lesches, MD   2 Units at 07/18/15 1214  . ondansetron (ZOFRAN) tablet 4 mg  4 mg Oral Q6H PRN Epifanio Lesches, MD       Or  . ondansetron (ZOFRAN) injection 4 mg  4 mg Intravenous Q6H PRN Epifanio Lesches, MD        Allergies as of 07/17/2015 - Review Complete 07/17/2015  Allergen Reaction Noted  . Ambien [zolpidem] Other (See Comments) 04/30/2015     Review of Systems:    A 12 system review was obtained and all negative except positive fatigue.     Physical Exam:  Vital signs in last 24 hours: Temp:  [97.5 F (36.4 C)-98.3 F (36.8 C)] 98 F (36.7 C) (07/10 1355) Pulse Rate:  [78-91] 82 (07/10 1355) Resp:  [11-18] 18 (07/10 1355) BP: (144-186)/(48-86) 145/67 mmHg (07/10 1355) SpO2:  [94 %-100 %] 100 % (07/10 1355) Weight:  [110.088 kg (242 lb 11.2 oz)] 110.088 kg (242 lb 11.2 oz) (07/10 0434) Last BM Date: 07/20/15  General:  Well-developed, edematous, drowsy female, in no acute distress Head:  Head without obvious abnormality, atraumatic  Eyes:   Conjunctiva pink, sclera anicteric   ENT:   Mouth free of lesions, mucosa dry Neck:   Supple w/o thyromegaly or mass, trachea midline, no adenopathy  Lungs: Clear to auscultation bilaterally, respirations unlabored Heart:     Normal S1S2, no rubs, positive systolic murmurs Abdomen: Soft, non tender in all quadrants, cannot  palpate accurately for HSM secondary to obesity Rectal: Rectum visually examined and slight bluish hemorrhoid in present and residual yellow stool smear. No DRE performed as patient refused.  Lymph:  No cervical or supraclavicular adenopathy. Extremities:   Gross edema x 4 extremities, no cyanosis Skin  Skin color, texture, turgor normal, no rashes or lesions Neuro:  She is awake briefly and oriented x 3 , behaving normally with sense of humor, and then falls to sleep s/p Permacath placement.  MOE x 4. Psych:  Appropriate mood and affect.  Data Reviewed:  LAB RESULTS:  Recent Labs  07/19/15 0421 07/21/15 0424  WBC 9.9 8.4  HGB 8.6* 8.6*  HCT 25.9* 25.6*  PLT 184 181   BMET  Recent Labs  07/20/15 0448 07/21/15 0424  NA 134* 133*  K 4.5 4.4  CL 108 103  CO2 21* 22  GLUCOSE 94 104*  BUN 59* 59*  CREATININE 5.44* 5.54*  CALCIUM 7.7* 7.8*   LFT No results for input(s): PROT, ALBUMIN, AST, ALT, ALKPHOS, BILITOT, BILIDIR, IBILI in the last 72 hours. PT/INR  Recent Labs  07/21/15 0424  LABPROT 14.6  INR 1.12    STUDIES: No results found.  Cardiac [place]: EF 65%  Assessment:  GISELE IANDOLI is a 55 y.o. w/ PMH of chronic diastolic CHF (EF 123456 by echo), pulmonary hypertension, Stage 4 CKD, poorly controlled HTN, on nighttime oxygen at 2L, and obesity who presented to [facility] on 7/6 with acute on chronic diastolic CHF, acute on chronic CKD stage 4, and malignant HTN. She has had anemia and heme positive stool with no evidence of active GIB. She does have a slight EXT hemorrhoid and yellow stool stain on visual exam. She refused DRE.   Plan:  Consider luminal evaluation with EGD/colonoscopy as outpatient when her CHF and Renal failure is improved in the absence of active GIB.  Monitor Hgb closely.   This case was discussed with Dr. Manya Silvas in collaboration of care. Thank you for the consultation.  These services provided by Denice Paradise RN, MSN, ANP-BC under  collaborative practice agreement with Manya Silvas, MD.  07/21/2015, 2:29 PM

## 2015-07-21 NOTE — Progress Notes (Signed)
Hospital Problem List     Principal Problem:   Acute on chronic diastolic CHF (congestive heart failure), NYHA class 1 (HCC) Active Problems:   Diabetes mellitus with renal complications (HCC)   Pulmonary hypertension (HCC)   Obesity   Acute renal failure superimposed on stage 3 chronic kidney disease (HCC)   Morbid obesity due to excess calories (HCC)   Anemia in chronic renal disease   Shortness of breath    Patient Profile:   Primary Cardiologist: Dr. Yvone Neu  55 y.o. female w/ PMH of chronic diastolic CHF (EF 123456 by echo), pulmonary hypertension, Stage 4 CKD, poorly controlled HTN, on nighttime oxygen at 2L, and obesity who presented to [facility] on 7/6 with acute on chronic diastolic CHF, acute on chronic CKD stage 4, and malignant HTN.   Subjective   For PermCath today. Respiratory status improving. No chest discomfort.   Inpatient Medications    .  ceFAZolin (ANCEF) IV  2 g Intravenous On Call to OR  . docusate sodium  100 mg Oral BID  . gabapentin  100 mg Oral BID  . glipiZIDE  5 mg Oral QAC breakfast  . insulin aspart  0-15 Units Subcutaneous TID WC    Vital Signs    Filed Vitals:   07/20/15 1254 07/20/15 1416 07/20/15 1927 07/21/15 0434  BP: 171/74 152/55 177/68 155/63  Pulse: 83 80 86 78  Temp:   97.7 F (36.5 C) 98.3 F (36.8 C)  TempSrc:   Oral Oral  Resp:   18 18  Height:      Weight:    242 lb 11.2 oz (110.088 kg)  SpO2:   94% 96%    Intake/Output Summary (Last 24 hours) at 07/21/15 0810 Last data filed at 07/21/15 0700  Gross per 24 hour  Intake 633.73 ml  Output   1400 ml  Net -766.27 ml   Filed Weights   07/19/15 0500 07/20/15 0356 07/21/15 0434  Weight: 246 lb 14.4 oz (111.993 kg) 243 lb 3.2 oz (110.315 kg) 242 lb 11.2 oz (110.088 kg)    Physical Exam    General: Well developed, well nourished, female appearing in no acute distress. Head: Normocephalic, atraumatic.  Neck: Supple without bruits, JVD difficult to assess secondary to  body habitus. Lungs:  Resp regular and unlabored, decreased breath sounds at bases. Heart: RRR, S1, S2, no S3, S4, or murmur; no rub. 2/6 holosystolic murmur at LSB. Abdomen: Soft, non-tender, non-distended with normoactive bowel sounds. No hepatomegaly. No rebound/guarding. No obvious abdominal masses. Extremities: No clubbing or cyanosis, 2+ edema bilaterally. Distal pedal pulses are 2+ bilaterally. Neuro: Alert and oriented X 3. Moves all extremities spontaneously. Psych: Normal affect.  Labs    CBC  Recent Labs  07/19/15 0421 07/21/15 0424  WBC 9.9 8.4  HGB 8.6* 8.6*  HCT 25.9* 25.6*  MCV 84.2 84.2  PLT 184 0000000   Basic Metabolic Panel  Recent Labs  07/20/15 0448 07/21/15 0424  NA 134* 133*  K 4.5 4.4  CL 108 103  CO2 21* 22  GLUCOSE 94 104*  BUN 59* 59*  CREATININE 5.44* 5.54*  CALCIUM 7.7* 7.8*    Telemetry    NSR, HR in 70's - 80's. No atopic events.   ECG    No new tracings.    Cardiac Studies and Radiology    Dg Chest 2 View  07/17/2015  CLINICAL DATA:  Shortness of breath EXAM: CHEST  2 VIEW COMPARISON:  03/15/2015 FINDINGS: Chronic cardiopericardial enlargement.  Small pleural effusions including fissural fluid seen laterally. Diffuse septal thickening with Kerley lines. IMPRESSION: CHF. Electronically Signed   By: Monte Fantasia M.D.   On: 07/17/2015 10:37   US Renal  07/17/2015  CLINICAL DATA:  History of renal failure, acute on chronic, diabetes, hypertension EXAM: RENAL / URINARY TRACT ULTRASOUND COMPLETE COMPARISON:  Ultrasound of the kidneys of 03/19/2015 FINDINGS: Right Kidney: Length: 7.1 cm. No hydronephrosis is seen. The echogenicity of the renal parenchyma is increased consistent with chronic renal medical disease. Within the midportion of the right kidney, there is a hypoechoic structure present of 1.3 x 2.4 x 2.0 cm. This probably represents a cyst, but with no significant through transmission, a solid lesion is difficult to exclude. Consider  CT or MRI if further assessment is warranted Left Kidney: Length: 9.1 cm. No hydronephrosis is seen. The echogenicity of the left kidney also is increased consistent with chronic renal medical disease. Bladder: The urinary bladder is unremarkable. Bilateral ureteral jets are visualized. IMPRESSION: 1. No hydronephrosis. 2. Echogenic renal parenchyma bilaterally consistent with chronic renal medical disease. 3. 2.4 cm hypoechoic structure in the mid right kidney not seen previously, with very little through transmission. Consider CT or MRI if further assessment is warranted. Electronically Signed   By: Ivar Drape M.D.   On: 07/17/2015 13:41    Echocardiogram: 07/18/2015 Study Conclusions  - Left ventricle: The cavity size was mildly dilated. Wall  thickness was increased in a pattern of severe LVH. Systolic  function was normal. The estimated ejection fraction was 65%.  Regional wall motion abnormalities cannot be excluded. Doppler  parameters are consistent with abnormal left ventricular  relaxation (grade 1 diastolic dysfunction). - Aortic valve: Valve area (Vmax): 2.04 cm^2. - Mitral valve: Calcified annulus. Mildly thickened leaflets .  There was mild regurgitation. - Right ventricle: The cavity size was severely dilated. - Pulmonary arteries: PA peak pressure: 51 mm Hg (S).  Impressions:  - The right ventricular systolic pressure was increased consistent  with severe pulmonary hypertension. severely dilated RA/RV/LA,  mild diastolic dysfunction.  Assessment & Plan    1. Acute on chronic diastolic CHF/pulmonary hypertension: - Likely in the setting of poor dietary compliance and poorly controlled HTN. Poor dietary compliance with significant volume intake, eating out at restaurants and adding salt to her food. Education has continued to be reinforced. - echo this admission with EF of 65% and Grade 1 DD.  - initially started on IV Lasix 40mg  BID then transitioned to Lasix  drip, however renal function has continued to worsen. For PermCath placement today and initiation of HD - PA Peak pressure elevated at 51 mm Hg on echo. Would likely benefit from a RHC as an outpatient for further assessment of her pressures.   2. Acute on CKD stage 4 CKD - renal function has continued to worsen, creatinine 5.54 this AM. - per Nephrology, for PermCath today.    3. Malignant hypertension - Improved, at 152/55 - 177/74 in the past 24 hours. - not on any current PO BP medications (has PRN IV Hydralazine).  - no ACE-I/ ARB with CKD. Would recommend initiation of Coreg, Amlodipine, or Hydralazine 25mg  Q8H. Will have to follow BP closely with initiation of HD.  4. Obesity/possible OSA - needs outpatient sleep study.   5. Acute on chronic anemia - likely secondary to underlying renal failure.  - Hgb was 10.4 four months ago, at 8.6 today. - per Dr. Caryl Comes, recommend against use of erythropoiesis-stimulating agents in patients  with mild to moderate anemia and HF.    6. Type 2 DM, uncontrolled with complications - Hemoglobin A1c 8.2, Likely again contributing to worsening renal failure - Will need strict outpatient follow-up, dietary changes  Signed, Erma Heritage , PA-C 8:10 AM 07/21/2015 Pager: [phone]

## 2015-07-21 NOTE — Op Note (Signed)
OPERATIVE NOTE    PRE-OPERATIVE DIAGNOSIS: 1. ESRD   POST-OPERATIVE DIAGNOSIS: same as above  PROCEDURE: 1. Ultrasound guidance for vascular access to the right internal jugular vein 2. Fluoroscopic guidance for placement of catheter 3. Placement of a 19 cm tip to cuff tunneled hemodialysis catheter via the right internal jugular vein  SURGEON: Leotis Pain, MD  ANESTHESIA:  Local with Moderate conscious sedation for approximately 20 minutes using 3 mg of Versed and 100 mcg of Fentanyl  ESTIMATED BLOOD LOSS: 25 cc  FLUORO TIME: less than one minute  CONTRAST: 0 cc  FINDING(S): 1.  Patent right internal jugular vein  SPECIMEN(S):  None  INDICATIONS:   Jacqueline Rodriguez is a 55 y.o. female who presents with renal failure.  The patient needs long term dialysis access for their ESRD, and a Permcath is necessary.  Risks and benefits are discussed and informed consent is obtained.    DESCRIPTION: After obtaining full informed written consent, the patient was brought back to the vascular suited. The patient's right neck and chest were sterilely prepped and draped in a sterile surgical field was created. Moderate conscious sedation was administered during a face to face encounter with the patient throughout the procedure with my supervision of the RN administering medicines and monitoring the patient's vital signs, pulse oximetry, telemetry and mental status throughout from the start of the procedure until the patient was taken to the recovery room.  The right internal jugular vein was visualized with ultrasound and found to be patent. It was then accessed under direct ultrasound guidance and a permanent image was recorded. A wire was placed. After skin nick and dilatation, the peel-away sheath was placed over the wire. I then turned my attention to an area under the clavicle. Approximately 1-2 fingerbreadths below the clavicle a small counterincision was created and tunneled from the subclavicular  incision to the access site. Using fluoroscopic guidance, a 19 centimeter tip to cuff tunneled hemodialysis catheter was selected, and tunneled from the subclavicular incision to the access site. It was then placed through the peel-away sheath and the peel-away sheath was removed. Using fluoroscopic guidance the catheter tips were parked in the right atrium. The appropriate distal connectors were placed. It withdrew blood well and flushed easily with heparinized saline and a concentrated heparin solution was then placed. It was secured to the chest wall with 2 Prolene sutures. The access incision was closed single 4-0 Monocryl. A 4-0 Monocryl pursestring suture was placed around the exit site. Sterile dressings were placed. The patient tolerated the procedure well and was taken to the recovery room in stable condition.  COMPLICATIONS: None  CONDITION: Stable  DEW,JASON  07/21/2015, 12:35 PM

## 2015-07-21 NOTE — Consult Note (Signed)
See note by Dawson Bills,  Pt has heme positive stool on rectal exam.  She has signif CHF and CRF with plans for dialysis.  We do not feel she is in good enough shape to undergo elective colonoscopy/EGD at this time.  Hopefully she will respond to planned interventions and we will be able to do these tests in a few weeks to months.

## 2015-07-21 NOTE — Progress Notes (Signed)
Total of 3mg  versed with fentanyl 113mcg iv for procedure

## 2015-07-21 NOTE — Progress Notes (Signed)
Patient is currently being followed in the [facility] and has a follow-up appointment scheduled on July 30, 2015 at 10:30am. Thank you.

## 2015-07-22 ENCOUNTER — Ambulatory Visit: Payer: BLUE CROSS/BLUE SHIELD | Admitting: Cardiology

## 2015-07-22 ENCOUNTER — Encounter: Payer: Self-pay | Admitting: Vascular Surgery

## 2015-07-22 DIAGNOSIS — I10 Essential (primary) hypertension: Secondary | ICD-10-CM

## 2015-07-22 LAB — GLUCOSE, CAPILLARY
GLUCOSE-CAPILLARY: 156 mg/dL — AB (ref 65–99)
GLUCOSE-CAPILLARY: 210 mg/dL — AB (ref 65–99)
Glucose-Capillary: 115 mg/dL — ABNORMAL HIGH (ref 65–99)
Glucose-Capillary: 169 mg/dL — ABNORMAL HIGH (ref 65–99)

## 2015-07-22 LAB — CBC
HEMATOCRIT: 24.1 % — AB (ref 35.0–47.0)
HEMOGLOBIN: 8 g/dL — AB (ref 12.0–16.0)
MCH: 27.4 pg (ref 26.0–34.0)
MCHC: 33.3 g/dL (ref 32.0–36.0)
MCV: 82.3 fL (ref 80.0–100.0)
PLATELETS: 160 10*3/uL (ref 150–440)
RBC: 2.92 MIL/uL — AB (ref 3.80–5.20)
RDW: 16.1 % — ABNORMAL HIGH (ref 11.5–14.5)
WBC: 7.2 10*3/uL (ref 3.6–11.0)

## 2015-07-22 LAB — BASIC METABOLIC PANEL
ANION GAP: 8 (ref 5–15)
BUN: 66 mg/dL — ABNORMAL HIGH (ref 6–20)
CHLORIDE: 106 mmol/L (ref 101–111)
CO2: 23 mmol/L (ref 22–32)
Calcium: 7.7 mg/dL — ABNORMAL LOW (ref 8.9–10.3)
Creatinine, Ser: 6.07 mg/dL — ABNORMAL HIGH (ref 0.44–1.00)
GFR calc Af Amer: 8 mL/min — ABNORMAL LOW (ref >60)
GFR, EST NON AFRICAN AMERICAN: 7 mL/min — AB (ref >60)
GLUCOSE: 104 mg/dL — AB (ref 65–99)
POTASSIUM: 4.6 mmol/L (ref 3.5–5.1)
Sodium: 137 mmol/L (ref 135–145)

## 2015-07-22 LAB — HEPATITIS B SURFACE ANTIBODY, QUANTITATIVE: Hepatitis B-Post: <3.1 m[IU]/mL — ABNORMAL LOW (ref >9.9)

## 2015-07-22 LAB — HEPATITIS B SURFACE ANTIGEN: Hepatitis B Surface Ag: NEGATIVE

## 2015-07-22 MED ORDER — HYDRALAZINE HCL 25 MG PO TABS
25.0000 mg | ORAL_TABLET | Freq: Three times a day (TID) | ORAL | Status: DC
  Administered 2015-07-22 – 2015-07-24 (×5): 25 mg via ORAL
  Filled 2015-07-22 (×5): qty 1

## 2015-07-22 MED ORDER — ISOSORBIDE MONONITRATE ER 30 MG PO TB24
30.0000 mg | ORAL_TABLET | Freq: Every day | ORAL | Status: DC
  Administered 2015-07-22 – 2015-07-23 (×2): 30 mg via ORAL
  Filled 2015-07-22 (×3): qty 1

## 2015-07-22 NOTE — Progress Notes (Signed)
Patient: Jacqueline Rodriguez / Admit Date: 07/17/2015 / Date of Encounter: 07/22/2015, 8:13 AM   Subjective: She is to start dialysis this morning. Renal function worsening on Lasix gtt. She is minus 4.7 L for the admission.  No acute overnight events.   Review of Systems: ROS  Objective: Telemetry: NSR, 70's to 80's bpm Physical Exam: Blood pressure 154/56, pulse 73, temperature 98.4 F (36.9 C), temperature source Oral, resp. rate 18, height 5\' 9"  (1.753 m), weight 236 lb 8 oz (107.276 kg), SpO2 98 %. Body mass index is 34.91 kg/(m^2). General: Well developed, well nourished, in no acute distress. Head: Normocephalic, atraumatic, sclera non-icteric, no xanthomas, nares are without discharge. Neck: Negative for carotid bruits. JVP not elevated. Lungs: Decreased breath sounds at this bases. Breathing is unlabored. Heart: RRR S1 S2 without murmurs, rubs, or gallops.  Abdomen: Soft, non-tender, non-distended with normoactive bowel sounds. No rebound/guarding. Extremities: No clubbing or cyanosis. 2+ edema. Distal pedal pulses are 2+ and equal bilaterally. Neuro: Alert and oriented X 3. Moves all extremities spontaneously. Psych:  Responds to questions appropriately with a normal affect.   Intake/Output Summary (Last 24 hours) at 07/22/15 0813 Last data filed at 07/22/15 0644  Gross per 24 hour  Intake   93.6 ml  Output   2050 ml  Net -1956.4 ml    Inpatient Medications:  . amLODipine  2.5 mg Oral Daily  . carvedilol  6.25 mg Oral BID WC  . docusate sodium  100 mg Oral BID  . gabapentin  100 mg Oral BID  . glipiZIDE  5 mg Oral QAC breakfast  . insulin aspart  0-15 Units Subcutaneous TID WC   Infusions:    Labs:  Recent Labs  07/21/15 0424 07/22/15 0601  NA 133* 137  K 4.4 4.6  CL 103 106  CO2 22 23  GLUCOSE 104* 104*  BUN 59* 66*  CREATININE 5.54* 6.07*  CALCIUM 7.8* 7.7*   No results for input(s): AST, ALT, ALKPHOS, BILITOT, PROT, ALBUMIN in the last 72  hours.  Recent Labs  07/21/15 0424 07/22/15 0601  WBC 8.4 7.2  HGB 8.6* 8.0*  HCT 25.6* 24.1*  MCV 84.2 82.3  PLT 181 160   No results for input(s): CKTOTAL, CKMB, TROPONINI in the last 72 hours. Invalid input(s): POCBNP No results for input(s): HGBA1C in the last 72 hours.   Weights: Filed Weights   07/20/15 0356 07/21/15 0434 07/22/15 0525  Weight: 243 lb 3.2 oz (110.315 kg) 242 lb 11.2 oz (110.088 kg) 236 lb 8 oz (107.276 kg)     Radiology/Studies:  Dg Chest 2 View  07/17/2015  CLINICAL DATA:  Shortness of breath EXAM: CHEST  2 VIEW COMPARISON:  03/15/2015 FINDINGS: Chronic cardiopericardial enlargement. Small pleural effusions including fissural fluid seen laterally. Diffuse septal thickening with Kerley lines. IMPRESSION: CHF. Electronically Signed   By: Monte Fantasia M.D.   On: 07/17/2015 10:37   US Renal  07/17/2015  CLINICAL DATA:  History of renal failure, acute on chronic, diabetes, hypertension EXAM: RENAL / URINARY TRACT ULTRASOUND COMPLETE COMPARISON:  Ultrasound of the kidneys of 03/19/2015 FINDINGS: Right Kidney: Length: 7.1 cm. No hydronephrosis is seen. The echogenicity of the renal parenchyma is increased consistent with chronic renal medical disease. Within the midportion of the right kidney, there is a hypoechoic structure present of 1.3 x 2.4 x 2.0 cm. This probably represents a cyst, but with no significant through transmission, a solid lesion is difficult to exclude. Consider CT or  MRI if further assessment is warranted Left Kidney: Length: 9.1 cm. No hydronephrosis is seen. The echogenicity of the left kidney also is increased consistent with chronic renal medical disease. Bladder: The urinary bladder is unremarkable. Bilateral ureteral jets are visualized. IMPRESSION: 1. No hydronephrosis. 2. Echogenic renal parenchyma bilaterally consistent with chronic renal medical disease. 3. 2.4 cm hypoechoic structure in the mid right kidney not seen previously, with very  little through transmission. Consider CT or MRI if further assessment is warranted. Electronically Signed   By: Ivar Drape M.D.   On: 07/17/2015 13:41     Assessment and Plan  Principal Problem:   Acute on chronic diastolic CHF (congestive heart failure), NYHA class 1 (HCC) Active Problems:   Pulmonary hypertension (HCC)   Acute renal failure superimposed on stage 3 chronic kidney disease (HCC)   Diabetes mellitus with renal complications (HCC)   Obesity   Morbid obesity due to excess calories (HCC)   Anemia in chronic renal disease   Shortness of breath    1. Acute on chronic diastolic CHF/pulmonary HTN: -Likely in the setting of poor dietary compliance and uncontrolled HTN -Echo this admission with EF of 65% and GR1DD -She underwent successful placement of PermCath on 7/10 -Planning to start dialysis today for volume management  -On Lasix gtt until dialysis today per Renal -May need RHC as an outpatient   2. Acute on CKD stage IV/V: -Continues to worsen -Now with ESRD that will require dialysis -Per Renal  3. Malignant HTN: -Improved -Would recommend hydralazine/Imdur  -Continue Coreg  4. Acute on anemia of chronic disease: -Playing a role in her SOB -Heme positive stools this admission -Not stable enough for EGD/colo per GI -Will need outpatient follow up with GI -Per IM -Hold erythropoiesis-stimulating agents given mild to moderate anemia and HF  5. DM2: -Uncontrolled -Per IM   Signed, Christell Faith, PA-C [facility] Pager: [phone] 07/22/2015, 8:13 AM

## 2015-07-22 NOTE — Progress Notes (Signed)
START OF HD

## 2015-07-22 NOTE — Progress Notes (Signed)
Alert and oriented. Has rested comfortably. Requested tylenol a couple of times for soreness in her neck from perm cath placement yesterday. Patient had her first dialysis treatment today and is scheduled to have her second one tomorrow. BP elevated today, but patient did not receive her meds until this afternoon due to dialysis.

## 2015-07-22 NOTE — Progress Notes (Signed)
PRE HD   

## 2015-07-22 NOTE — Progress Notes (Signed)
POST _HD Assessment 

## 2015-07-22 NOTE — Progress Notes (Addendum)
[place] at [facility]                                                                                                                                                                                            Patient Demographics   Jacqueline Rodriguez, is a 55 y.o. female, DOB - 30-Oct-1960, [id number]  Admit date - 07/17/2015   Admitting Physician Epifanio Lesches, MD  Outpatient Primary MD for the patient is Christie Nottingham., PA   LOS - 5  Subjective: Came with SOB and CHF, initially given lasix inj, not much help, but worsening renal func. Nephrology started on IV Lasix drip and patient is showing good response to that.   Worsening renal function , so nephro is planned to start HD.   Hb dropped Checked- stool guiac was positive.   S/p permacath placement 07/21/15. HD started on 07/22/15.  Review of Systems:   CONSTITUTIONAL: No documented fever. No fatigue, weakness. No weight gain, no weight loss.  EYES: No blurry or double vision.  ENT: No tinnitus. No postnasal drip. No redness of the oropharynx.  RESPIRATORY: No cough, no wheeze, no hemoptysis. No dyspnea.  CARDIOVASCULAR: No chest pain.+ orthopnea. No palpitations. No syncope.  GASTROINTESTINAL: No nausea, no vomiting or diarrhea. No abdominal pain. No melena or hematochezia.  GENITOURINARY: No dysuria or hematuria.  ENDOCRINE: No polyuria or nocturia. No heat or cold intolerance.  HEMATOLOGY: No anemia. No bruising. No bleeding.  INTEGUMENTARY: No rashes. No lesions.  MUSCULOSKELETAL: No arthritis. Positive swelling. No gout.  NEUROLOGIC: No numbness, tingling, or ataxia. No seizure-type activity.  PSYCHIATRIC: No anxiety. No insomnia. No ADD.    Vitals:   Filed Vitals:   07/22/15 1357 07/22/15 1358 07/22/15 1424 07/22/15 1919  BP:  191/85 176/73 126/52  Pulse: 78  80 73  Temp: 97.8 F (36.6 C)  98.1 F (36.7 C) 98.3 F (36.8 C)  TempSrc: Oral  Oral Oral  Resp: 11  20 18   Height:       Weight:   106.051 kg (233 lb 12.8 oz)   SpO2:   100% 98%    Wt Readings from Last 3 Encounters:  07/22/15 106.051 kg (233 lb 12.8 oz)  04/30/15 97.07 kg (214 lb)  03/25/15 102.059 kg (225 lb)     Intake/Output Summary (Last 24 hours) at 07/22/15 2145 Last data filed at 07/22/15 2100  Gross per 24 hour  Intake   93.6 ml  Output   1575 ml  Net -1481.4 ml    Physical Exam:   GENERAL: Pleasant-appearing in no apparent distress.  HEAD, EYES, EARS,  NOSE AND THROAT: Atraumatic, normocephalic. Extraocular muscles are intact. Pupils equal and reactive to light. Sclerae anicteric. No conjunctival injection. No oro-pharyngeal erythema.  NECK: Supple. There is no jugular venous distention. No bruits, no lymphadenopathy, no thyromegaly.  HEART: Regular rate and rhythm,. No murmurs, no rubs, no clicks.  LUNGS: Clear to auscultation bilaterally. No rales or rhonchi. No wheezes.  ABDOMEN: Soft, flat, nontender, nondistended. Has good bowel sounds. No hepatosplenomegaly appreciated.  EXTREMITIES: No evidence of any cyanosis, clubbing, or  2+ peripheral edema.  +2 pedal and radial pulses bilaterally.  NEUROLOGIC: The patient is alert, awake, and oriented x3 with no focal motor or sensory deficits appreciated bilaterally.  SKIN: Moist and warm with no rashes appreciated.  Psych: Not anxious, depressed LN: No inguinal LN enlargement    Antibiotics   Anti-infectives    Start     Dose/Rate Route Frequency Ordered Stop   07/21/15 1215  cefUROXime (ZINACEF) 1.5 g in dextrose 5 % 50 mL IVPB     1.5 g 100 mL/hr over 30 Minutes Intravenous  Once 07/21/15 1214 07/21/15 1245   07/21/15 0600  ceFAZolin (ANCEF) IVPB 2g/100 mL premix     2 g 200 mL/hr over 30 Minutes Intravenous On call to O.R. 07/20/15 1515 07/22/15 0559      Medications   Scheduled Meds: . amLODipine  2.5 mg Oral Daily  . carvedilol  6.25 mg Oral BID WC  . docusate sodium  100 mg Oral BID  . gabapentin  100 mg Oral BID   . glipiZIDE  5 mg Oral QAC breakfast  . hydrALAZINE  25 mg Oral Q8H  . insulin aspart  0-15 Units Subcutaneous TID WC  . isosorbide mononitrate  30 mg Oral Daily   Continuous Infusions:   PRN Meds:.acetaminophen **OR** acetaminophen, albuterol, diphenhydrAMINE, hydrALAZINE, ondansetron **OR** ondansetron (ZOFRAN) IV   Data Review:   Micro Results No results found for this or any previous visit (from the past 240 hour(s)).  Radiology Reports Dg Chest 2 View  07/17/2015  CLINICAL DATA:  Shortness of breath EXAM: CHEST  2 VIEW COMPARISON:  03/15/2015 FINDINGS: Chronic cardiopericardial enlargement. Small pleural effusions including fissural fluid seen laterally. Diffuse septal thickening with Kerley lines. IMPRESSION: CHF. Electronically Signed   By: Monte Fantasia M.D.   On: 07/17/2015 10:37   US Renal  07/17/2015  CLINICAL DATA:  History of renal failure, acute on chronic, diabetes, hypertension EXAM: RENAL / URINARY TRACT ULTRASOUND COMPLETE COMPARISON:  Ultrasound of the kidneys of 03/19/2015 FINDINGS: Right Kidney: Length: 7.1 cm. No hydronephrosis is seen. The echogenicity of the renal parenchyma is increased consistent with chronic renal medical disease. Within the midportion of the right kidney, there is a hypoechoic structure present of 1.3 x 2.4 x 2.0 cm. This probably represents a cyst, but with no significant through transmission, a solid lesion is difficult to exclude. Consider CT or MRI if further assessment is warranted Left Kidney: Length: 9.1 cm. No hydronephrosis is seen. The echogenicity of the left kidney also is increased consistent with chronic renal medical disease. Bladder: The urinary bladder is unremarkable. Bilateral ureteral jets are visualized. IMPRESSION: 1. No hydronephrosis. 2. Echogenic renal parenchyma bilaterally consistent with chronic renal medical disease. 3. 2.4 cm hypoechoic structure in the mid right kidney not seen previously, with very little through  transmission. Consider CT or MRI if further assessment is warranted. Electronically Signed   By: Ivar Drape M.D.   On: 07/17/2015 13:41     CBC  Recent  Labs Lab 07/17/15 1129 07/18/15 0503 07/19/15 0421 07/21/15 0424 07/22/15 0601  WBC 8.8 7.9 9.9 8.4 7.2  HGB 9.0* 8.5* 8.6* 8.6* 8.0*  HCT 27.4* 24.8* 25.9* 25.6* 24.1*  PLT 192 168 184 181 160  MCV 82.8 82.4 84.2 84.2 82.3  MCH 27.2 28.2 28.0 28.1 27.4  MCHC 32.9 34.2 33.3 33.4 33.3  RDW 16.6* 16.0* 16.6* 16.3* 16.1*    Chemistries   Recent Labs Lab 07/17/15 1129 07/18/15 0503 07/19/15 0421 07/20/15 0448 07/21/15 0424 07/22/15 0601  NA 139 137  --  134* 133* 137  K 4.9 4.8 4.8 4.5 4.4 4.6  CL 110 110  --  108 103 106  CO2 20* 20*  --  21* 22 23  GLUCOSE 159* 154*  --  94 104* 104*  BUN 52* 53*  --  59* 59* 66*  CREATININE 4.64* 4.78* 4.97* 5.44* 5.54* 6.07*  CALCIUM 8.3* 7.9*  --  7.7* 7.8* 7.7*  AST 20  --   --   --   --   --   ALT 19  --   --   --   --   --   ALKPHOS 83  --   --   --   --   --   BILITOT 0.6  --   --   --   --   --    ------------------------------------------------------------------------------------------------------------------ estimated creatinine clearance is 13.3 mL/min (by C-G formula based on Cr of 6.07). ------------------------------------------------------------------------------------------------------------------ No results for input(s): HGBA1C in the last 72 hours. ------------------------------------------------------------------------------------------------------------------ No results for input(s): CHOL, HDL, LDLCALC, TRIG, CHOLHDL, LDLDIRECT in the last 72 hours. ------------------------------------------------------------------------------------------------------------------ No results for input(s): TSH, T4TOTAL, T3FREE, THYROIDAB in the last 72 hours.  Invalid input(s):  FREET3 ------------------------------------------------------------------------------------------------------------------ No results for input(s): VITAMINB12, FOLATE, FERRITIN, TIBC, IRON, RETICCTPCT in the last 72 hours.  Coagulation profile  Recent Labs Lab 07/21/15 0424  INR 1.12    No results for input(s): DDIMER in the last 72 hours.  Cardiac Enzymes  Recent Labs Lab 07/17/15 1129  TROPONINI 0.03*   ------------------------------------------------------------------------------------------------------------------ Invalid input(s): POCBNP    Assessment & Plan  Patient is a 55 year old white female with history of diastolic congestive heart failure presents with shortness of breath  #1. Acute on chronic diastolic congestive heart failure: Continue IV Lasix Drip Appreciated nephrology help,   Monitor renal function closely. Gradual worsening.  started on HD now.  #2 acute respiratory failure due to acute CHF continued therapy as above  #3 acute on chronic renal failure with chronic kidney disease stage 3  Renal function gradually worse, appreciate nephrology input.  s/p permacath 07/21/15. Started HD 07/22/15, Need to arrange Out pt HD.  #4 diabetes mellitus type 2: Continue glipizide, add sliding scale with coverage.  #5 diabetes neuropathy: Continue Neurontin.  #6 malignant hypertension . Blood pressure currently improved continue therapy with hydralazine  #7 anemia- likely due to chronic renal failure  positive for stool guaiac, continue monitoring.   GI may not do procedure until renal and cardiac status are stable.     Code Status Orders        Start     Ordered   07/17/15 1241  Full code   Continuous     07/17/15 1244    Code Status History    Date Active Date Inactive Code Status Order ID Comments User Context   03/15/2015  7:56 PM 03/20/2015  3:48 PM Full Code [id number]  Idelle Crouch, MD Inpatient  Consults  Cardiology and nephrology DVT  Prophylaxis  Lovenox  Lab Results  Component Value Date   PLT 160 07/22/2015     Time Spent in minutes   35 minutes Greater than 50% of time spent in care coordination and counseling patient regarding the condition and plan of care.   Vaughan Basta M.D on 07/22/2015 at 9:45 PM  Between 7am to 6pm - Pager - [phone]  After 6pm go to [email or web address] - password [email or web address] [place] [facility]   Office  [phone]

## 2015-07-22 NOTE — Progress Notes (Signed)
Post HD  

## 2015-07-22 NOTE — Progress Notes (Signed)
PRE HD ASSESSMENT 

## 2015-07-22 NOTE — Progress Notes (Signed)
HD tx ended 

## 2015-07-22 NOTE — Progress Notes (Signed)
[facility]  ROUNDING NOTE   Subjective:   Permcath placed today. Initial dialysis treatment for later today.   Objective:  Vital signs in last 24 hours:  Temp:  [98 F (36.7 C)-98.4 F (36.9 C)] 98.4 F (36.9 C) (07/11 0525) Pulse Rate:  [73-91] 73 (07/11 0525) Resp:  [11-18] 18 (07/11 0525) BP: (134-186)/(56-86) 154/56 mmHg (07/11 0525) SpO2:  [95 %-100 %] 98 % (07/11 0525) Weight:  [107.276 kg (236 lb 8 oz)] 107.276 kg (236 lb 8 oz) (07/11 0525)  Weight change: -2.812 kg (-6 lb 3.2 oz) Filed Weights   07/20/15 0356 07/21/15 0434 07/22/15 0525  Weight: 110.315 kg (243 lb 3.2 oz) 110.088 kg (242 lb 11.2 oz) 107.276 kg (236 lb 8 oz)    Intake/Output: I/O last 3 completed shifts: In: 157.6 [I.V.:157.6] Out: 2850 [Urine:2850]   Intake/Output this shift:  Total I/O In: -  Out: 125 [Urine:125]  Physical Exam: General: NAD, laying in bed  Head: Normocephalic, atraumatic. Moist oral mucosal membranes  Eyes: Anicteric  Neck: Supple, trachea midline  Lungs:  Clear bilaterally  Heart: Regular rate and rhythm  Abdomen:  Soft, nontender, BS present  Extremities: 1+ peripheral edema.  Neurologic: Nonfocal, moving all four extremities  Skin: No lesions  Access:  RIJ permcath 7/10 Dr. Lucky Cowboy    Basic Metabolic Panel:  Recent Labs Lab 07/17/15 1129 07/18/15 0503 07/19/15 0421 07/20/15 0448 07/21/15 0424 07/22/15 0601  NA 139 137  --  134* 133* 137  K 4.9 4.8 4.8 4.5 4.4 4.6  CL 110 110  --  108 103 106  CO2 20* 20*  --  21* 22 23  GLUCOSE 159* 154*  --  94 104* 104*  BUN 52* 53*  --  59* 59* 66*  CREATININE 4.64* 4.78* 4.97* 5.44* 5.54* 6.07*  CALCIUM 8.3* 7.9*  --  7.7* 7.8* 7.7*    Liver Function Tests:  Recent Labs Lab 07/17/15 1129  AST 20  ALT 19  ALKPHOS 83  BILITOT 0.6  PROT 7.1  ALBUMIN 3.0*   No results for input(s): LIPASE, AMYLASE in the last 168 hours. No results for input(s): AMMONIA in the last 168 hours.  CBC:  Recent  Labs Lab 07/17/15 1129 07/18/15 0503 07/19/15 0421 07/21/15 0424 07/22/15 0601  WBC 8.8 7.9 9.9 8.4 7.2  HGB 9.0* 8.5* 8.6* 8.6* 8.0*  HCT 27.4* 24.8* 25.9* 25.6* 24.1*  MCV 82.8 82.4 84.2 84.2 82.3  PLT 192 168 184 181 160    Cardiac Enzymes:  Recent Labs Lab 07/17/15 1129  TROPONINI 0.03*    BNP: Invalid input(s): POCBNP  CBG:  Recent Labs Lab 07/21/15 0738 07/21/15 1116 07/21/15 1610 07/21/15 2036 07/22/15 0740  GLUCAP 94 92 155* 206* 156*    Microbiology: Results for orders placed or performed during the hospital encounter of 03/15/15  MRSA PCR Screening     Status: None   Collection Time: 03/15/15  7:56 PM  Result Value Ref Range Status   MRSA by PCR NEGATIVE NEGATIVE Final    Comment:        The GeneXpert MRSA Assay (FDA approved for NASAL specimens only), is one component of a comprehensive MRSA colonization surveillance program. It is not intended to diagnose MRSA infection nor to guide or monitor treatment for MRSA infections.     Coagulation Studies:  Recent Labs  07/21/15 0424  LABPROT 14.6  INR 1.12    Urinalysis: No results for input(s): COLORURINE, LABSPEC, [place], GLUCOSEU, HGBUR, BILIRUBINUR, KETONESUR,  PROTEINUR, UROBILINOGEN, NITRITE, LEUKOCYTESUR in the last 72 hours.  Invalid input(s): APPERANCEUR    Imaging: No results found.   Medications:     . amLODipine  2.5 mg Oral Daily  . carvedilol  6.25 mg Oral BID WC  . docusate sodium  100 mg Oral BID  . gabapentin  100 mg Oral BID  . glipiZIDE  5 mg Oral QAC breakfast  . hydrALAZINE  25 mg Oral Q8H  . insulin aspart  0-15 Units Subcutaneous TID WC  . isosorbide mononitrate  30 mg Oral Daily   acetaminophen **OR** acetaminophen, albuterol, diphenhydrAMINE, hydrALAZINE, ondansetron **OR** ondansetron (ZOFRAN) IV  Assessment/ Plan:  55 y.o. female  with diastolic congestive heart failure, hypertension, diabetes mellitus type II, diabetic retinopathy, diabetic  neuropathy, CKD stage IV/V  1.  Acute renal failure on chronic kidney disease stage IV/V:  Progressive decline in renal function in last few months. Now with worsening renal function and concern for requiring dialysis.  - Treatment for later today.   - hold furosemide  2.  Hypertension/Diastolic Dysfunction/Pulmonary edema:  - amlodipine and carvedilol.   3.  Anemia chronic kidney disease.   - epo with HD treatment.   LOS: [place], Leng Montesdeoca 7/11/201711:27 AM

## 2015-07-23 LAB — CBC
HCT: 23.4 % — ABNORMAL LOW (ref 35.0–47.0)
HEMOGLOBIN: 7.8 g/dL — AB (ref 12.0–16.0)
MCH: 27.6 pg (ref 26.0–34.0)
MCHC: 33.4 g/dL (ref 32.0–36.0)
MCV: 82.5 fL (ref 80.0–100.0)
PLATELETS: 152 10*3/uL (ref 150–440)
RBC: 2.84 MIL/uL — AB (ref 3.80–5.20)
RDW: 16 % — ABNORMAL HIGH (ref 11.5–14.5)
WBC: 7.2 10*3/uL (ref 3.6–11.0)

## 2015-07-23 LAB — COMPREHENSIVE METABOLIC PANEL
ALK PHOS: 55 U/L (ref 38–126)
ALT: 13 U/L — ABNORMAL LOW (ref 14–54)
ANION GAP: 7 (ref 5–15)
AST: 16 U/L (ref 15–41)
Albumin: 2.5 g/dL — ABNORMAL LOW (ref 3.5–5.0)
BUN: 46 mg/dL — ABNORMAL HIGH (ref 6–20)
CALCIUM: 7.3 mg/dL — AB (ref 8.9–10.3)
CO2: 28 mmol/L (ref 22–32)
Chloride: 101 mmol/L (ref 101–111)
Creatinine, Ser: 4.67 mg/dL — ABNORMAL HIGH (ref 0.44–1.00)
GFR, EST AFRICAN AMERICAN: 11 mL/min — AB (ref >60)
GFR, EST NON AFRICAN AMERICAN: 10 mL/min — AB (ref >60)
Glucose, Bld: 117 mg/dL — ABNORMAL HIGH (ref 65–99)
Potassium: 4.2 mmol/L (ref 3.5–5.1)
SODIUM: 136 mmol/L (ref 135–145)
Total Bilirubin: 0.1 mg/dL — ABNORMAL LOW (ref 0.3–1.2)
Total Protein: 5.9 g/dL — ABNORMAL LOW (ref 6.5–8.1)

## 2015-07-23 LAB — HEPATITIS B SURFACE ANTIGEN: Hepatitis B Surface Ag: NEGATIVE

## 2015-07-23 LAB — PHOSPHORUS: PHOSPHORUS: 6.2 mg/dL — AB (ref 2.5–4.6)

## 2015-07-23 LAB — GLUCOSE, CAPILLARY
GLUCOSE-CAPILLARY: 108 mg/dL — AB (ref 65–99)
GLUCOSE-CAPILLARY: 86 mg/dL (ref 65–99)
GLUCOSE-CAPILLARY: 119 mg/dL — AB (ref 65–99)
GLUCOSE-CAPILLARY: 132 mg/dL — AB (ref 65–99)

## 2015-07-23 LAB — HEPATITIS B CORE ANTIBODY, TOTAL: HEP B C TOTAL AB: NEGATIVE

## 2015-07-23 LAB — HEPATITIS B SURFACE ANTIBODY,QUALITATIVE: Hep B S Ab: NONREACTIVE

## 2015-07-23 LAB — PARATHYROID HORMONE, INTACT (NO CA): PTH: 209 pg/mL — AB (ref 15–65)

## 2015-07-23 MED ORDER — LISINOPRIL 10 MG PO TABS
10.0000 mg | ORAL_TABLET | Freq: Every day | ORAL | Status: DC
  Administered 2015-07-23: 10 mg via ORAL
  Filled 2015-07-23: qty 1

## 2015-07-23 MED ORDER — AMLODIPINE BESYLATE 5 MG PO TABS
5.0000 mg | ORAL_TABLET | Freq: Every day | ORAL | Status: DC
  Administered 2015-07-23: 5 mg via ORAL
  Filled 2015-07-23: qty 1

## 2015-07-23 MED ORDER — EPOETIN ALFA 10000 UNIT/ML IJ SOLN
10000.0000 [IU] | Freq: Once | INTRAMUSCULAR | Status: AC
  Administered 2015-07-23: 10000 [IU] via INTRAVENOUS

## 2015-07-23 NOTE — Progress Notes (Signed)
Pre Dialysis 

## 2015-07-23 NOTE — Progress Notes (Signed)
Dialysis complete

## 2015-07-23 NOTE — Progress Notes (Signed)
[facility]  ROUNDING NOTE   Subjective:   Seen and examined on hemodialysis. Second treatment. First treatment yesterday, tolerated yet.   Objective:  Vital signs in last 24 hours:  Temp:  [97.7 F (36.5 C)-98.5 F (36.9 C)] 97.7 F (36.5 C) (07/12 0915) Pulse Rate:  [69-85] 78 (07/12 0915) Resp:  [10-20] 14 (07/12 0915) BP: (126-191)/(52-94) 165/68 mmHg (07/12 0915) SpO2:  [93 %-100 %] 98 % (07/12 0915) Weight:  [106.006 kg (233 lb 11.2 oz)-109.6 kg (241 lb 10 oz)] 109.3 kg (240 lb 15.4 oz) (07/12 0915)  Weight change: 2.324 kg (5 lb 2 oz) Filed Weights   07/22/15 1424 07/23/15 0451 07/23/15 0915  Weight: 106.051 kg (233 lb 12.8 oz) 106.006 kg (233 lb 11.2 oz) 109.3 kg (240 lb 15.4 oz)    Intake/Output: I/O last 3 completed shifts: In: 93.6 [I.V.:93.6] Out: 2125 [Urine:2125]   Intake/Output this shift:  Total I/O In: -  Out: 100 [Urine:100]  Physical Exam: General: NAD, laying in bed  Head: Normocephalic, atraumatic. Moist oral mucosal membranes  Eyes: Anicteric  Neck: Supple, trachea midline  Lungs:  Clear bilaterally  Heart: Regular rate and rhythm  Abdomen:  Soft, nontender, BS present  Extremities: 1+ peripheral edema.  Neurologic: Nonfocal, moving all four extremities  Skin: No lesions  Access:  RIJ permcath 7/10 Dr. Lucky Cowboy    Basic Metabolic Panel:  Recent Labs Lab 07/18/15 0503 07/19/15 0421 07/20/15 0448 07/21/15 0424 07/22/15 0601 07/23/15 0831  NA 137  --  134* 133* 137 136  K 4.8 4.8 4.5 4.4 4.6 4.2  CL 110  --  108 103 106 101  CO2 20*  --  21* 22 23 28   GLUCOSE 154*  --  94 104* 104* 117*  BUN 53*  --  59* 59* 66* 46*  CREATININE 4.78* 4.97* 5.44* 5.54* 6.07* 4.67*  CALCIUM 7.9*  --  7.7* 7.8* 7.7* 7.3*  PHOS  --   --   --   --   --  6.2*    Liver Function Tests:  Recent Labs Lab 07/17/15 1129 07/23/15 0831  AST 20 16  ALT 19 13*  ALKPHOS 83 55  BILITOT 0.6 0.1*  PROT 7.1 5.9*  ALBUMIN 3.0* 2.5*   No results  for input(s): LIPASE, AMYLASE in the last 168 hours. No results for input(s): AMMONIA in the last 168 hours.  CBC:  Recent Labs Lab 07/18/15 0503 07/19/15 0421 07/21/15 0424 07/22/15 0601 07/23/15 0831  WBC 7.9 9.9 8.4 7.2 7.2  HGB 8.5* 8.6* 8.6* 8.0* 7.8*  HCT 24.8* 25.9* 25.6* 24.1* 23.4*  MCV 82.4 84.2 84.2 82.3 82.5  PLT 168 184 181 160 152    Cardiac Enzymes:  Recent Labs Lab 07/17/15 1129  TROPONINI 0.03*    BNP: Invalid input(s): POCBNP  CBG:  Recent Labs Lab 07/22/15 0740 07/22/15 1135 07/22/15 1640 07/22/15 2039 07/23/15 0723  GLUCAP 156* 115* 169* 210* 108*    Microbiology: Results for orders placed or performed during the hospital encounter of 03/15/15  MRSA PCR Screening     Status: None   Collection Time: 03/15/15  7:56 PM  Result Value Ref Range Status   MRSA by PCR NEGATIVE NEGATIVE Final    Comment:        The GeneXpert MRSA Assay (FDA approved for NASAL specimens only), is one component of a comprehensive MRSA colonization surveillance program. It is not intended to diagnose MRSA infection nor to guide or monitor treatment for MRSA  infections.     Coagulation Studies:  Recent Labs  07/21/15 0424  LABPROT 14.6  INR 1.12    Urinalysis: No results for input(s): COLORURINE, LABSPEC, PHURINE, GLUCOSEU, HGBUR, BILIRUBINUR, KETONESUR, PROTEINUR, UROBILINOGEN, NITRITE, LEUKOCYTESUR in the last 72 hours.  Invalid input(s): APPERANCEUR    Imaging: No results found.   Medications:     . amLODipine  5 mg Oral Daily  . carvedilol  6.25 mg Oral BID WC  . docusate sodium  100 mg Oral BID  . gabapentin  100 mg Oral BID  . glipiZIDE  5 mg Oral QAC breakfast  . hydrALAZINE  25 mg Oral Q8H  . insulin aspart  0-15 Units Subcutaneous TID WC  . isosorbide mononitrate  30 mg Oral Daily   acetaminophen **OR** acetaminophen, albuterol, diphenhydrAMINE, hydrALAZINE, ondansetron **OR** ondansetron (ZOFRAN) IV  Assessment/ Plan:  55  y.o. female  with diastolic congestive heart failure, hypertension, diabetes mellitus type II, diabetic retinopathy, diabetic neuropathy, CKD stage IV/V  1.  Acute renal failure on chronic kidney disease stage V:  Progressive decline in renal function in last few months. Now with worsening renal function and concern for requiring dialysis. Nonoliguric but with some uremic symptoms and worsening renal function .  First dialysis through [facility] on 07/22/15. Second treatment today, tolerating treatment well.    - hold furosemide - Will initiate outpatient planning.   2.  Hypertension/Diastolic Dysfunction/Pulmonary edema:  - amlodipine and carvedilol.  - holding diuretics currently - restart lisinopril 10mg  daily.   3.  Anemia chronic kidney disease.   - epo with HD treatment.   LOS: [place], Jacqueline Rodriguez 7/12/20179:27 AM

## 2015-07-23 NOTE — Progress Notes (Signed)
Post dialysis 

## 2015-07-23 NOTE — Progress Notes (Signed)
Dialysis started 

## 2015-07-23 NOTE — Progress Notes (Signed)
Patient lost her PIV access , but refused insertion of another PIV. Permcath site is clean dry and intact. She has no acute event overnight. She remained hemodynamically stable, NSR, and VS WDL for patient. Patient was assisted as needed.

## 2015-07-23 NOTE — Progress Notes (Signed)
[place] at [facility]                                                                                                                                                                                            Patient Demographics   Jacqueline Rodriguez, is a 55 y.o. female, DOB - [birth year], [id number]  Admit date - 07/17/2015   Admitting Physician Epifanio Lesches, MD  Outpatient Primary MD for the patient is Christie Nottingham., PA   LOS - 6  Subjective: Came with SOB and CHF, initially given lasix inj, not much help, but worsening renal func. Nephrology started on IV Lasix drip and patient is showing good response to that.   Worsening renal function , so nephro is planned to start HD.   Hb dropped Checked- stool guiac was positive.   S/p permacath placement 07/21/15. HD started on 07/22/15.  one more dialysis today. Pt tolerated HD yesterday.  Review of Systems:   CONSTITUTIONAL: No documented fever. No fatigue, weakness. No weight gain, no weight loss.  EYES: No blurry or double vision.  ENT: No tinnitus. No postnasal drip. No redness of the oropharynx.  RESPIRATORY: No cough, no wheeze, no hemoptysis. No dyspnea.  CARDIOVASCULAR: No chest pain.+ orthopnea. No palpitations. No syncope.  GASTROINTESTINAL: No nausea, no vomiting or diarrhea. No abdominal pain. No melena or hematochezia.  GENITOURINARY: No dysuria or hematuria.  ENDOCRINE: No polyuria or nocturia. No heat or cold intolerance.  HEMATOLOGY: No anemia. No bruising. No bleeding.  INTEGUMENTARY: No rashes. No lesions.  MUSCULOSKELETAL: No arthritis. Positive swelling. No gout.  NEUROLOGIC: No numbness, tingling, or ataxia. No seizure-type activity.  PSYCHIATRIC: No anxiety. No insomnia. No ADD.    Vitals:   Filed Vitals:   07/23/15 1200 07/23/15 1202 07/23/15 1211 07/23/15 1235  BP: 165/75 166/83  162/56  Pulse: 69 69 70 70  Temp:  97.8 F (36.6 C)  98.7 F (37.1 C)  TempSrc:  Oral   Oral  Resp: 15 20 12 19   Height:      Weight:  108.2 kg (238 lb 8.6 oz)    SpO2: 99% 99% 99% 94%    Wt Readings from Last 3 Encounters:  07/23/15 108.2 kg (238 lb 8.6 oz)  04/30/15 97.07 kg (214 lb)  03/25/15 102.059 kg (225 lb)     Intake/Output Summary (Last 24 hours) at 07/23/15 1416 Last data filed at 07/23/15 1202  Gross per 24 hour  Intake      0 ml  Output   2050 ml  Net  -2050 ml    Physical Exam:  GENERAL: Pleasant-appearing in no apparent distress.  HEAD, EYES, EARS, NOSE AND THROAT: Atraumatic, normocephalic. Extraocular muscles are intact. Pupils equal and reactive to light. Sclerae anicteric. No conjunctival injection. No oro-pharyngeal erythema.  NECK: Supple. There is no jugular venous distention. No bruits, no lymphadenopathy, no thyromegaly.  HEART: Regular rate and rhythm,. No murmurs, no rubs, no clicks.  LUNGS: Clear to auscultation bilaterally. No rales or rhonchi. No wheezes.  ABDOMEN: Soft, flat, nontender, nondistended. Has good bowel sounds. No hepatosplenomegaly appreciated.  EXTREMITIES: No evidence of any cyanosis, clubbing, or  2+ peripheral edema.  +2 pedal and radial pulses bilaterally.  NEUROLOGIC: The patient is alert, awake, and oriented x3 with no focal motor or sensory deficits appreciated bilaterally.  SKIN: Moist and warm with no rashes appreciated.  Psych: Not anxious, depressed LN: No inguinal LN enlargement    Antibiotics   Anti-infectives    Start     Dose/Rate Route Frequency Ordered Stop   07/21/15 1215  cefUROXime (ZINACEF) 1.5 g in dextrose 5 % 50 mL IVPB     1.5 g 100 mL/hr over 30 Minutes Intravenous  Once 07/21/15 1214 07/21/15 1245   07/21/15 0600  ceFAZolin (ANCEF) IVPB 2g/100 mL premix     2 g 200 mL/hr over 30 Minutes Intravenous On call to O.R. 07/20/15 1515 07/22/15 0559      Medications   Scheduled Meds: . amLODipine  5 mg Oral Daily  . carvedilol  6.25 mg Oral BID WC  . docusate sodium  100 mg Oral BID   . gabapentin  100 mg Oral BID  . glipiZIDE  5 mg Oral QAC breakfast  . hydrALAZINE  25 mg Oral Q8H  . insulin aspart  0-15 Units Subcutaneous TID WC  . isosorbide mononitrate  30 mg Oral Daily  . lisinopril  10 mg Oral Daily   Continuous Infusions:   PRN Meds:.acetaminophen **OR** acetaminophen, albuterol, diphenhydrAMINE, hydrALAZINE, ondansetron **OR** ondansetron (ZOFRAN) IV   Data Review:   Micro Results No results found for this or any previous visit (from the past 240 hour(s)).  Radiology Reports Dg Chest 2 View  07/17/2015  CLINICAL DATA:  Shortness of breath EXAM: CHEST  2 VIEW COMPARISON:  03/15/2015 FINDINGS: Chronic cardiopericardial enlargement. Small pleural effusions including fissural fluid seen laterally. Diffuse septal thickening with Kerley lines. IMPRESSION: CHF. Electronically Signed   By: Monte Fantasia M.D.   On: 07/17/2015 10:37   US Renal  07/17/2015  CLINICAL DATA:  History of renal failure, acute on chronic, diabetes, hypertension EXAM: RENAL / URINARY TRACT ULTRASOUND COMPLETE COMPARISON:  Ultrasound of the kidneys of 03/19/2015 FINDINGS: Right Kidney: Length: 7.1 cm. No hydronephrosis is seen. The echogenicity of the renal parenchyma is increased consistent with chronic renal medical disease. Within the midportion of the right kidney, there is a hypoechoic structure present of 1.3 x 2.4 x 2.0 cm. This probably represents a cyst, but with no significant through transmission, a solid lesion is difficult to exclude. Consider CT or MRI if further assessment is warranted Left Kidney: Length: 9.1 cm. No hydronephrosis is seen. The echogenicity of the left kidney also is increased consistent with chronic renal medical disease. Bladder: The urinary bladder is unremarkable. Bilateral ureteral jets are visualized. IMPRESSION: 1. No hydronephrosis. 2. Echogenic renal parenchyma bilaterally consistent with chronic renal medical disease. 3. 2.4 cm hypoechoic structure in the mid  right kidney not seen previously, with very little through transmission. Consider CT or MRI if further assessment is warranted. Electronically Signed  By: Ivar Drape M.D.   On: 07/17/2015 13:41     CBC  Recent Labs Lab 07/18/15 0503 07/19/15 0421 07/21/15 0424 07/22/15 0601 07/23/15 0831  WBC 7.9 9.9 8.4 7.2 7.2  HGB 8.5* 8.6* 8.6* 8.0* 7.8*  HCT 24.8* 25.9* 25.6* 24.1* 23.4*  PLT 168 184 181 160 152  MCV 82.4 84.2 84.2 82.3 82.5  MCH 28.2 28.0 28.1 27.4 27.6  MCHC 34.2 33.3 33.4 33.3 33.4  RDW 16.0* 16.6* 16.3* 16.1* 16.0*    Chemistries   Recent Labs Lab 07/17/15 1129 07/18/15 0503 07/19/15 0421 07/20/15 0448 07/21/15 0424 07/22/15 0601 07/23/15 0831  NA 139 137  --  134* 133* 137 136  K 4.9 4.8 4.8 4.5 4.4 4.6 4.2  CL 110 110  --  108 103 106 101  CO2 20* 20*  --  21* 22 23 28   GLUCOSE 159* 154*  --  94 104* 104* 117*  BUN 52* 53*  --  59* 59* 66* 46*  CREATININE 4.64* 4.78* 4.97* 5.44* 5.54* 6.07* 4.67*  CALCIUM 8.3* 7.9*  --  7.7* 7.8* 7.7* 7.3*  AST 20  --   --   --   --   --  16  ALT 19  --   --   --   --   --  13*  ALKPHOS 83  --   --   --   --   --  55  BILITOT 0.6  --   --   --   --   --  0.1*   ------------------------------------------------------------------------------------------------------------------ estimated creatinine clearance is 17.4 mL/min (by C-G formula based on Cr of 4.67). ------------------------------------------------------------------------------------------------------------------ No results for input(s): HGBA1C in the last 72 hours. ------------------------------------------------------------------------------------------------------------------ No results for input(s): CHOL, HDL, LDLCALC, TRIG, CHOLHDL, LDLDIRECT in the last 72 hours. ------------------------------------------------------------------------------------------------------------------ No results for input(s): TSH, T4TOTAL, T3FREE, THYROIDAB in the last 72  hours.  Invalid input(s): FREET3 ------------------------------------------------------------------------------------------------------------------ No results for input(s): VITAMINB12, FOLATE, FERRITIN, TIBC, IRON, RETICCTPCT in the last 72 hours.  Coagulation profile  Recent Labs Lab 07/21/15 0424  INR 1.12    No results for input(s): DDIMER in the last 72 hours.  Cardiac Enzymes  Recent Labs Lab 07/17/15 1129  TROPONINI 0.03*   ------------------------------------------------------------------------------------------------------------------ Invalid input(s): POCBNP    Assessment & Plan  Patient is a 55 year old white female with history of diastolic congestive heart failure presents with shortness of breath  #1. Acute on chronic diastolic congestive heart failure: Continue IV Lasix Drip Appreciated nephrology help,   Monitor renal function closely. Gradual worsening.  started on HD now.   Will need some more fluid removal.  #2 acute respiratory failure due to acute CHF continued therapy as above   HD to remove fluid.  #3 acute on chronic renal failure with chronic kidney disease stage 3  Renal function gradually worse, appreciate nephrology input.  s/p permacath 07/21/15. Started HD 07/22/15, Need to arrange Out pt HD.  #4 diabetes mellitus type 2: Continue glipizide, add sliding scale with coverage.  #5 diabetes neuropathy: Continue Neurontin.  #6 malignant hypertension . Blood pressure currently improved continue therapy with hydralazine  #7 anemia- likely due to chronic renal failure  positive for stool guaiac, continue monitoring.   GI may not do procedure until renal and cardiac status are stable.   May be as out pt.     Code Status Orders        Start     Ordered   07/17/15 1241  Full  code   Continuous     07/17/15 1244    Code Status History    Date Active Date Inactive Code Status Order ID Comments User Context   03/15/2015  7:56 PM 03/20/2015   3:48 PM Full Code [id number]  Idelle Crouch, MD Inpatient      Consults  Cardiology and nephrology DVT Prophylaxis  Lovenox  Lab Results  Component Value Date   PLT 152 07/23/2015     Time Spent in minutes   35 minutes Greater than 50% of time spent in care coordination and counseling patient regarding the condition and plan of care.   Vaughan Basta M.D on 07/23/2015 at 2:16 PM  Between 7am to 6pm - Pager - [phone]  After 6pm go to [email or web address] - password [email or web address] [place] [facility]   Office  [phone]

## 2015-07-24 LAB — BASIC METABOLIC PANEL
Anion gap: 6 (ref 5–15)
BUN: 30 mg/dL — AB (ref 6–20)
CALCIUM: 7.5 mg/dL — AB (ref 8.9–10.3)
CO2: 29 mmol/L (ref 22–32)
CREATININE: 3.58 mg/dL — AB (ref 0.44–1.00)
Chloride: 102 mmol/L (ref 101–111)
GFR calc Af Amer: 15 mL/min — ABNORMAL LOW (ref >60)
GFR, EST NON AFRICAN AMERICAN: 13 mL/min — AB (ref >60)
GLUCOSE: 104 mg/dL — AB (ref 65–99)
Potassium: 4.1 mmol/L (ref 3.5–5.1)
SODIUM: 137 mmol/L (ref 135–145)

## 2015-07-24 LAB — CBC
HCT: 23.5 % — ABNORMAL LOW (ref 35.0–47.0)
Hemoglobin: 7.8 g/dL — ABNORMAL LOW (ref 12.0–16.0)
MCH: 27.5 pg (ref 26.0–34.0)
MCHC: 33.3 g/dL (ref 32.0–36.0)
MCV: 82.7 fL (ref 80.0–100.0)
PLATELETS: 147 10*3/uL — AB (ref 150–440)
RBC: 2.84 MIL/uL — ABNORMAL LOW (ref 3.80–5.20)
RDW: 16.2 % — AB (ref 11.5–14.5)
WBC: 7.1 10*3/uL (ref 3.6–11.0)

## 2015-07-24 LAB — GLUCOSE, CAPILLARY
Glucose-Capillary: 109 mg/dL — ABNORMAL HIGH (ref 65–99)
Glucose-Capillary: 77 mg/dL (ref 65–99)

## 2015-07-24 LAB — PARATHYROID HORMONE, INTACT (NO CA): PTH: 149 pg/mL — AB (ref 15–65)

## 2015-07-24 MED ORDER — HYDRALAZINE HCL 25 MG PO TABS: 25.0000 mg | ORAL_TABLET | Freq: Three times a day (TID) | ORAL | Status: DC

## 2015-07-24 MED ORDER — LISINOPRIL 10 MG PO TABS: 10.0000 mg | ORAL_TABLET | Freq: Every day | ORAL | Status: DC

## 2015-07-24 MED ORDER — FERROUS SULFATE 325 (65 FE) MG PO TABS: 325.0000 mg | ORAL_TABLET | Freq: Two times a day (BID) | ORAL | Status: DC

## 2015-07-24 MED ORDER — AMLODIPINE BESYLATE 5 MG PO TABS: 5.0000 mg | ORAL_TABLET | Freq: Every day | ORAL | Status: DC

## 2015-07-24 MED ORDER — CARVEDILOL 6.25 MG PO TABS: 6.2500 mg | ORAL_TABLET | Freq: Two times a day (BID) | ORAL | Status: DC

## 2015-07-24 MED ORDER — ISOSORBIDE MONONITRATE ER 30 MG PO TB24: 30.0000 mg | ORAL_TABLET | Freq: Every day | ORAL | Status: DC

## 2015-07-24 NOTE — Care Management (Addendum)
Patient made multiple clinic changes to dialysis plan this am. Dialysis Coordinator unable to complete dialysis plan prior to discharge. Discharge cancelled and patient refused to stay. Patient advised against leaving without a follow up dialysis plan. She was made aware that the dialysis coordinator could not help her if she leaves. Dr. Juleen China had CM tell patient he could get her out tomorrow. She continued to refuse. Dr. Juleen China aware. Dr. Anselm Jungling aware. Patient will have to sign out AMA. Iran Sizer heard conversation with patient conversation with patient. Primary nurse updated.

## 2015-07-24 NOTE — Progress Notes (Signed)
To dialysis unit with transport.

## 2015-07-24 NOTE — Discharge Instructions (Signed)
Heart Failure Clinic appointment on July 30, 2015 at 10:30am with Darylene Price, [place]. Please call [phone] to reschedule.   Follow with GI and Cardio clinic as advised.

## 2015-07-24 NOTE — Progress Notes (Signed)
[place] at [facility]                                                                                                                                                                                            Patient Demographics   Jacqueline Rodriguez, is a 55 y.o. female, DOB - [birth year], [id number]  Admit date - 07/17/2015   Admitting Physician Epifanio Lesches, MD  Outpatient Primary MD for the patient is Christie Nottingham., PA   LOS - 7  Subjective: Came with SOB and CHF, initially given lasix inj, not much help, but worsening renal func. Nephrology started on IV Lasix drip and patient is showing good response to that.   Worsening renal function , so nephro is planned to start HD.   Hb dropped Checked- stool guiac was positive.   S/p permacath placement 07/21/15. HD started on 07/22/15.  one more dialysis today. Pt tolerated HD for 3 days.  Initially CM told- pt have HD out pt set up, but later- there was some problem- and so pt should not go home today.  Review of Systems:   CONSTITUTIONAL: No documented fever. No fatigue, weakness. No weight gain, no weight loss.  EYES: No blurry or double vision.  ENT: No tinnitus. No postnasal drip. No redness of the oropharynx.  RESPIRATORY: No cough, no wheeze, no hemoptysis. No dyspnea.  CARDIOVASCULAR: No chest pain.+ orthopnea. No palpitations. No syncope.  GASTROINTESTINAL: No nausea, no vomiting or diarrhea. No abdominal pain. No melena or hematochezia.  GENITOURINARY: No dysuria or hematuria.  ENDOCRINE: No polyuria or nocturia. No heat or cold intolerance.  HEMATOLOGY: No anemia. No bruising. No bleeding.  INTEGUMENTARY: No rashes. No lesions.  MUSCULOSKELETAL: No arthritis. Positive swelling. No gout.  NEUROLOGIC: No numbness, tingling, or ataxia. No seizure-type activity.  PSYCHIATRIC: No anxiety. No insomnia. No ADD.    Vitals:   Filed Vitals:   07/24/15 1230 07/24/15 1300 07/24/15 1305  07/24/15 1352  BP: 155/76 171/88 166/94 149/57  Pulse: 70 70 69 73  Temp:   98.4 F (36.9 C) 97.7 F (36.5 C)  TempSrc:   Oral Oral  Resp: 15 16 15 17   Height:      Weight:   106.3 kg (234 lb 5.6 oz)   SpO2: 99% 99% 99% 99%    Wt Readings from Last 3 Encounters:  07/24/15 106.3 kg (234 lb 5.6 oz)  04/30/15 97.07 kg (214 lb)  03/25/15 102.059 kg (225 lb)     Intake/Output Summary (Last 24 hours) at 07/24/15 1510 Last data filed at 07/24/15 1305  Gross per 24 hour  Intake    240 ml  Output   1270 ml  Net  -1030 ml    Physical Exam:   GENERAL: Pleasant-appearing in no apparent distress.  HEAD, EYES, EARS, NOSE AND THROAT: Atraumatic, normocephalic. Extraocular muscles are intact. Pupils equal and reactive to light. Sclerae anicteric. No conjunctival injection. No oro-pharyngeal erythema.  NECK: Supple. There is no jugular venous distention. No bruits, no lymphadenopathy, no thyromegaly.  HEART: Regular rate and rhythm,. No murmurs, no rubs, no clicks.  LUNGS: Clear to auscultation bilaterally. No rales or rhonchi. No wheezes.  ABDOMEN: Soft, flat, nontender, nondistended. Has good bowel sounds. No hepatosplenomegaly appreciated.  EXTREMITIES: No evidence of any cyanosis, clubbing, or  2+ peripheral edema.  +2 pedal and radial pulses bilaterally.  NEUROLOGIC: The patient is alert, awake, and oriented x3 with no focal motor or sensory deficits appreciated bilaterally.  SKIN: Moist and warm with no rashes appreciated.  Psych: Not anxious, depressed LN: No inguinal LN enlargement    Antibiotics   Anti-infectives    Start     Dose/Rate Route Frequency Ordered Stop   07/21/15 1215  cefUROXime (ZINACEF) 1.5 g in dextrose 5 % 50 mL IVPB     1.5 g 100 mL/hr over 30 Minutes Intravenous  Once 07/21/15 1214 07/21/15 1245   07/21/15 0600  ceFAZolin (ANCEF) IVPB 2g/100 mL premix     2 g 200 mL/hr over 30 Minutes Intravenous On call to O.R. 07/20/15 1515 07/22/15 0559       Medications   Scheduled Meds: . amLODipine  5 mg Oral Daily  . carvedilol  6.25 mg Oral BID WC  . docusate sodium  100 mg Oral BID  . gabapentin  100 mg Oral BID  . glipiZIDE  5 mg Oral QAC breakfast  . hydrALAZINE  25 mg Oral Q8H  . insulin aspart  0-15 Units Subcutaneous TID WC  . isosorbide mononitrate  30 mg Oral Daily  . lisinopril  10 mg Oral Daily   Continuous Infusions:   PRN Meds:.acetaminophen **OR** acetaminophen, albuterol, diphenhydrAMINE, hydrALAZINE, ondansetron **OR** ondansetron (ZOFRAN) IV   Data Review:   Micro Results No results found for this or any previous visit (from the past 240 hour(s)).  Radiology Reports Dg Chest 2 View  07/17/2015  CLINICAL DATA:  Shortness of breath EXAM: CHEST  2 VIEW COMPARISON:  03/15/2015 FINDINGS: Chronic cardiopericardial enlargement. Small pleural effusions including fissural fluid seen laterally. Diffuse septal thickening with Kerley lines. IMPRESSION: CHF. Electronically Signed   By: Monte Fantasia M.D.   On: 07/17/2015 10:37   US Renal  07/17/2015  CLINICAL DATA:  History of renal failure, acute on chronic, diabetes, hypertension EXAM: RENAL / URINARY TRACT ULTRASOUND COMPLETE COMPARISON:  Ultrasound of the kidneys of 03/19/2015 FINDINGS: Right Kidney: Length: 7.1 cm. No hydronephrosis is seen. The echogenicity of the renal parenchyma is increased consistent with chronic renal medical disease. Within the midportion of the right kidney, there is a hypoechoic structure present of 1.3 x 2.4 x 2.0 cm. This probably represents a cyst, but with no significant through transmission, a solid lesion is difficult to exclude. Consider CT or MRI if further assessment is warranted Left Kidney: Length: 9.1 cm. No hydronephrosis is seen. The echogenicity of the left kidney also is increased consistent with chronic renal medical disease. Bladder: The urinary bladder is unremarkable. Bilateral ureteral jets are visualized. IMPRESSION: 1. No  hydronephrosis. 2. Echogenic renal parenchyma bilaterally consistent with chronic renal medical disease. 3. 2.4 cm hypoechoic structure in  the mid right kidney not seen previously, with very little through transmission. Consider CT or MRI if further assessment is warranted. Electronically Signed   By: Ivar Drape M.D.   On: 07/17/2015 13:41     CBC  Recent Labs Lab 07/19/15 0421 07/21/15 0424 07/22/15 0601 07/23/15 0831 07/24/15 0427  WBC 9.9 8.4 7.2 7.2 7.1  HGB 8.6* 8.6* 8.0* 7.8* 7.8*  HCT 25.9* 25.6* 24.1* 23.4* 23.5*  PLT 184 181 160 152 147*  MCV 84.2 84.2 82.3 82.5 82.7  MCH 28.0 28.1 27.4 27.6 27.5  MCHC 33.3 33.4 33.3 33.4 33.3  RDW 16.6* 16.3* 16.1* 16.0* 16.2*    Chemistries   Recent Labs Lab 07/20/15 0448 07/21/15 0424 07/22/15 0601 07/23/15 0831 07/24/15 0427  NA 134* 133* 137 136 137  K 4.5 4.4 4.6 4.2 4.1  CL 108 103 106 101 102  CO2 21* 22 23 28 29   GLUCOSE 94 104* 104* 117* 104*  BUN 59* 59* 66* 46* 30*  CREATININE 5.44* 5.54* 6.07* 4.67* 3.58*  CALCIUM 7.7* 7.8* 7.7* 7.3* 7.5*  AST  --   --   --  16  --   ALT  --   --   --  13*  --   ALKPHOS  --   --   --  55  --   BILITOT  --   --   --  0.1*  --    ------------------------------------------------------------------------------------------------------------------ estimated creatinine clearance is 22.5 mL/min (by C-G formula based on Cr of 3.58). ------------------------------------------------------------------------------------------------------------------ No results for input(s): HGBA1C in the last 72 hours. ------------------------------------------------------------------------------------------------------------------ No results for input(s): CHOL, HDL, LDLCALC, TRIG, CHOLHDL, LDLDIRECT in the last 72 hours. ------------------------------------------------------------------------------------------------------------------ No results for input(s): TSH, T4TOTAL, T3FREE, THYROIDAB in the last 72  hours.  Invalid input(s): FREET3 ------------------------------------------------------------------------------------------------------------------ No results for input(s): VITAMINB12, FOLATE, FERRITIN, TIBC, IRON, RETICCTPCT in the last 72 hours.  Coagulation profile  Recent Labs Lab 07/21/15 0424  INR 1.12    No results for input(s): DDIMER in the last 72 hours.  Cardiac Enzymes No results for input(s): CKMB, TROPONINI, MYOGLOBIN in the last 168 hours.  Invalid input(s): CK ------------------------------------------------------------------------------------------------------------------ Invalid input(s): [place]  Patient is a 55 year old white female with history of diastolic congestive heart failure presents with shortness of breath  #1. Acute on chronic diastolic congestive heart failure: Continue IV Lasix Drip Appreciated nephrology help,   Monitor renal function closely. Gradual worsening.  started on HD now.   Will need some more fluid removal.   Did HD 3 consecutive days.   Need out pt HD set up.  #2 acute respiratory failure due to acute CHF continued therapy as above   HD to remove fluid.  #3 acute on chronic renal failure with chronic kidney disease stage 3  Renal function gradually worse, appreciate nephrology input.  s/p permacath 07/21/15. Started HD 07/22/15, Need to arrange Out pt HD.  #4 diabetes mellitus type 2: Continue glipizide, add sliding scale with coverage.  #5 diabetes neuropathy: Continue Neurontin.  #6 malignant hypertension . Blood pressure currently improved continue therapy with hydralazine     On amlodipine and coreg.  #7 anemia- likely due to chronic renal failure  positive for stool guaiac, continue monitoring.   GI may not do procedure until renal and cardiac status are stable.   May be as out pt.     Code Status Orders        Start     Ordered  07/17/15 1241  Full code   Continuous     07/17/15 1244     Code Status History    Date Active Date Inactive Code Status Order ID Comments User Context   03/15/2015  7:56 PM 03/20/2015  3:48 PM Full Code [id number]  Idelle Crouch, MD Inpatient      Consults  Cardiology and nephrology DVT Prophylaxis  Lovenox  Lab Results  Component Value Date   PLT 147* 07/24/2015     Time Spent in minutes   35 minutes Greater than 50% of time spent in care coordination and counseling patient regarding the condition and plan of care. Awaiting Out pt HD arrangement for discharge.  Vaughan Basta M.D on 07/24/2015 at 3:10 PM  Between 7am to 6pm - Pager - [phone]  After 6pm go to [email or web address] - password [email or web address] [facility]   Office  [phone]

## 2015-07-24 NOTE — Progress Notes (Signed)
[facility]  ROUNDING NOTE   Subjective:   Seen and examined on hemodialysis. Third treatment. Tolerating treatment well.   Objective:  Vital signs in last 24 hours:  Temp:  [97.8 F (36.6 C)-98.7 F (37.1 C)] 98.1 F (36.7 C) (07/13 0955) Pulse Rate:  [65-76] 74 (07/13 1030) Resp:  [11-20] 14 (07/13 1030) BP: (104-170)/(54-104) 170/73 mmHg (07/13 1030) SpO2:  [93 %-100 %] 99 % (07/13 1030) Weight:  [107.412 kg (236 lb 12.8 oz)-108.2 kg (238 lb 8.6 oz)] 107.412 kg (236 lb 12.8 oz) (07/13 0955)  Weight change: -0.3 kg (-10.6 oz) Filed Weights   07/23/15 1202 07/24/15 0341 07/24/15 0955  Weight: 108.2 kg (238 lb 8.6 oz) 107.412 kg (236 lb 12.8 oz) 107.412 kg (236 lb 12.8 oz)    Intake/Output: I/O last 3 completed shifts: In: -  Out: 1920 [Urine:920; Other:1000]   Intake/Output this shift:  Total I/O In: 240 [P.O.:240] Out: -   Physical Exam: General: NAD, laying in bed  Head: Normocephalic, atraumatic. Moist oral mucosal membranes  Eyes: Anicteric  Neck: Supple, trachea midline  Lungs:  Clear bilaterally  Heart: Regular rate and rhythm  Abdomen:  Soft, nontender, BS present  Extremities: 1+ peripheral edema.  Neurologic: Nonfocal, moving all four extremities  Skin: No lesions  Access:  RIJ permcath 7/10 Dr. Lucky Cowboy    Basic Metabolic Panel:  Recent Labs Lab 07/20/15 0448 07/21/15 0424 07/22/15 0601 07/23/15 0831 07/24/15 0427  NA 134* 133* 137 136 137  K 4.5 4.4 4.6 4.2 4.1  CL 108 103 106 101 102  CO2 21* 22 23 28 29   GLUCOSE 94 104* 104* 117* 104*  BUN 59* 59* 66* 46* 30*  CREATININE 5.44* 5.54* 6.07* 4.67* 3.58*  CALCIUM 7.7* 7.8* 7.7* 7.3* 7.5*  PHOS  --   --   --  6.2*  --     Liver Function Tests:  Recent Labs Lab 07/17/15 1129 07/23/15 0831  AST 20 16  ALT 19 13*  ALKPHOS 83 55  BILITOT 0.6 0.1*  PROT 7.1 5.9*  ALBUMIN 3.0* 2.5*   No results for input(s): LIPASE, AMYLASE in the last 168 hours. No results for input(s):  AMMONIA in the last 168 hours.  CBC:  Recent Labs Lab 07/19/15 0421 07/21/15 0424 07/22/15 0601 07/23/15 0831 07/24/15 0427  WBC 9.9 8.4 7.2 7.2 7.1  HGB 8.6* 8.6* 8.0* 7.8* 7.8*  HCT 25.9* 25.6* 24.1* 23.4* 23.5*  MCV 84.2 84.2 82.3 82.5 82.7  PLT 184 181 160 152 147*    Cardiac Enzymes:  Recent Labs Lab 07/17/15 1129  TROPONINI 0.03*    BNP: Invalid input(s): POCBNP  CBG:  Recent Labs Lab 07/23/15 0723 07/23/15 1206 07/23/15 1645 07/23/15 2041 07/24/15 0722  GLUCAP 108* 86 119* 132* 109*    Microbiology: Results for orders placed or performed during the hospital encounter of 03/15/15  MRSA PCR Screening     Status: None   Collection Time: 03/15/15  7:56 PM  Result Value Ref Range Status   MRSA by PCR NEGATIVE NEGATIVE Final    Comment:        The GeneXpert MRSA Assay (FDA approved for NASAL specimens only), is one component of a comprehensive MRSA colonization surveillance program. It is not intended to diagnose MRSA infection nor to guide or monitor treatment for MRSA infections.     Coagulation Studies: No results for input(s): LABPROT, INR in the last 72 hours.  Urinalysis: No results for input(s): COLORURINE, LABSPEC, [place], [place],  HGBUR, BILIRUBINUR, KETONESUR, PROTEINUR, UROBILINOGEN, NITRITE, LEUKOCYTESUR in the last 72 hours.  Invalid input(s): APPERANCEUR    Imaging: No results found.   Medications:     . amLODipine  5 mg Oral Daily  . carvedilol  6.25 mg Oral BID WC  . docusate sodium  100 mg Oral BID  . gabapentin  100 mg Oral BID  . glipiZIDE  5 mg Oral QAC breakfast  . hydrALAZINE  25 mg Oral Q8H  . insulin aspart  0-15 Units Subcutaneous TID WC  . isosorbide mononitrate  30 mg Oral Daily  . lisinopril  10 mg Oral Daily   acetaminophen **OR** acetaminophen, albuterol, diphenhydrAMINE, hydrALAZINE, ondansetron **OR** ondansetron (ZOFRAN) IV  Assessment/ Plan:  55 y.o. female  with diastolic congestive heart  failure, hypertension, diabetes mellitus type II, diabetic retinopathy, diabetic neuropathy  1.  Acute renal failure on chronic kidney disease stage V:  Progressive decline in renal function in last few months. Will now label as End Stage Renal Disease requiring dialysis.  First dialysis through [facility] on 07/22/15. Third treatment today, tolerating treatment well.    - Will initiate outpatient planning. [place]  2.  Hypertension/Diastolic Dysfunction/Pulmonary edema:  - amlodipine, lisinopril and carvedilol.  - holding diuretics currently   3.  Anemia chronic kidney disease.   - epo with HD treatment.  4. Secondary Hyperparathyroidism: PTH low at 149. Not currently on any vitamin D agents.    LOS: [place], [place] 7/13/201710:41 AM

## 2015-07-24 NOTE — Progress Notes (Signed)
Hemodialysis started

## 2015-07-24 NOTE — Care Management (Signed)
Patient given dialysis coordinator contact number to follow up with her tomorrow regarding dialysis.

## 2015-07-24 NOTE — Discharge Summary (Signed)
[facility] [place] NAME: Jacqueline Rodriguez    MR#:  [id number]  DATE OF BIRTH:  1960-04-16  DATE OF ADMISSION:  07/17/2015 ADMITTING PHYSICIAN: Epifanio Lesches, MD  DATE OF DISCHARGE: 07/24/2015  PRIMARY CARE PHYSICIAN: Christie Nottingham., PA    ADMISSION DIAGNOSIS:  AKI (acute kidney injury) ([place]) [N17.9] Renal failure (ARF), acute on chronic (HCC) [N17.9, N18.9] Acute on chronic congestive heart failure, unspecified congestive heart failure type ([place]) [I50.9]  DISCHARGE DIAGNOSIS:  Principal Problem:   Acute on chronic diastolic CHF (congestive heart failure), NYHA class 1 (HCC) Active Problems:   Diabetes mellitus with renal complications (HCC)   Pulmonary hypertension (HCC)   Obesity   Acute renal failure superimposed on stage 3 chronic kidney disease (HCC)   Morbid obesity due to excess calories (HCC)   Anemia in chronic renal disease   Shortness of breath   SECONDARY DIAGNOSIS:   Past Medical History  Diagnosis Date  . Essential hypertension   . Diabetes mellitus with renal complications ([place])   . Chronic diastolic CHF (congestive heart failure) ([place])   . CKD (chronic kidney disease), stage III   . Chronic bronchitis ([place])   . Pulmonary hypertension ([place])   . Obesity     HOSPITAL COURSE:   #1. Acute on chronic diastolic congestive heart failure: Continue IV Lasix Drip Appreciated nephrology help,  Monitor renal function closely. Gradual worsening. started on HD now.  Will need some more fluid removal.   Arranged for out pt hemodialysis.  #2 acute respiratory failure due to acute CHF continued therapy as above  HD to remove fluid.   Follow up in CHF clinic and cardiology after 1 week.  #3 acute on chronic renal failure with chronic kidney disease stage 3  Renal function gradually worse, appreciate nephrology input. s/p permacath 07/21/15. Started HD 07/22/15, Need to arrange Out pt HD.  #4 diabetes  mellitus type 2: Continue glipizide, add sliding scale with coverage.  #5 diabetes neuropathy: Continue Neurontin.  #6 malignant hypertension . Blood pressure currently improved continue therapy with hydralazine   Stable on oral meds.  #7 anemia- likely due to chronic renal failure positive for stool guaiac, continue monitoring.  GI may not do procedure until renal and cardiac status are stable.  Follow as out pt in one month.  DISCHARGE CONDITIONS:   Stable.  CONSULTS OBTAINED:  Treatment Team:  Anthonette Legato, MD Minna Merritts, MD Manya Silvas, MD Katha Cabal, MD  DRUG ALLERGIES:   Allergies  Allergen Reactions  . Ambien [Zolpidem] Other (See Comments)    hallucinations    DISCHARGE MEDICATIONS:   Current Discharge Medication List    START taking these medications   Details  amLODipine (NORVASC) 5 MG tablet Take 1 tablet (5 mg total) by mouth daily. Qty: 30 tablet, Refills: 0    carvedilol (COREG) 6.25 MG tablet Take 1 tablet (6.25 mg total) by mouth 2 (two) times daily with a meal. Qty: 60 tablet, Refills: 0    ferrous sulfate (FERROUSUL) 325 (65 FE) MG tablet Take 1 tablet (325 mg total) by mouth 2 (two) times daily with a meal. Qty: 60 tablet, Refills: 2    hydrALAZINE (APRESOLINE) 25 MG tablet Take 1 tablet (25 mg total) by mouth every 8 (eight) hours. Qty: 90 tablet, Refills: 0    isosorbide mononitrate (IMDUR) 30 MG 24 hr tablet Take 1 tablet (30 mg total) by mouth daily. Qty: 30 tablet, Refills:  0      CONTINUE these medications which have CHANGED   Details  lisinopril (PRINIVIL,ZESTRIL) 10 MG tablet Take 1 tablet (10 mg total) by mouth daily. Qty: 30 tablet, Refills: 0      CONTINUE these medications which have NOT CHANGED   Details  albuterol (PROVENTIL HFA;VENTOLIN HFA) 108 (90 Base) MCG/ACT inhaler Inhale 2 puffs into the lungs every 6 (six) hours as needed for wheezing or shortness of breath. Qty: 1 Inhaler, Refills: 2     aspirin EC 81 MG EC tablet Take 1 tablet (81 mg total) by mouth daily. Qty: 30 tablet, Refills: 0    gabapentin (NEURONTIN) 100 MG capsule Take 100 mg by mouth 3 (three) times daily as needed. For neuropathy. Refills: 2    glipiZIDE (GLUCOTROL) 5 MG tablet Take 5 mg by mouth daily before breakfast.          DISCHARGE INSTRUCTIONS:    Follow with cardiology and CHF clinic.  If you experience worsening of your admission symptoms, develop shortness of breath, life threatening emergency, suicidal or homicidal thoughts you must seek medical attention immediately by calling 911 or calling your MD immediately  if symptoms less severe.  You Must read complete instructions/literature along with all the possible adverse reactions/side effects for all the Medicines you take and that have been prescribed to you. Take any new Medicines after you have completely understood and accept all the possible adverse reactions/side effects.   Please note  You were cared for by a hospitalist during your hospital stay. If you have any questions about your discharge medications or the care you received while you were in the hospital after you are discharged, you can call the unit and asked to speak with the hospitalist on call if the hospitalist that took care of you is not available. Once you are discharged, your primary care physician will handle any further medical issues. Please note that NO REFILLS for any discharge medications will be authorized once you are discharged, as it is imperative that you return to your primary care physician (or establish a relationship with a primary care physician if you do not have one) for your aftercare needs so that they can reassess your need for medications and monitor your lab values.    Today   CHIEF COMPLAINT:   Chief Complaint  Patient presents with  . Shortness of Breath    HISTORY OF PRESENT ILLNESS:  Jacqueline Rodriguez  is a 55 y.o. female with a known history of  essential hypertension, diabetes mellitus type 2, chronic kidney disease stage III comes in because of shortness of breath for 2 days, lower extremity edema getting worse progressively for last few days. Patient does have orthopnea, PND. No chest pain, patient has some cough with clear phlegm. Found to have acute on chronic diastolic heart failure, worsening renal failure with creatinine of 4.64. The NP 261. Saturations are 91% on room air. She uses oxygen at night but now she is using the daytime also.   VITAL SIGNS:  Blood pressure 165/59, pulse 74, temperature 98.1 F (36.7 C), temperature source Oral, resp. rate 11, height 5\' 9"  (1.753 m), weight 107.412 kg (236 lb 12.8 oz), SpO2 98 %.  I/O:   Intake/Output Summary (Last 24 hours) at 07/24/15 1115 Last data filed at 07/24/15 0719  Gross per 24 hour  Intake    240 ml  Output   1370 ml  Net  -1130 ml    PHYSICAL EXAMINATION:  GENERAL:  Pleasant-appearing in no apparent distress.  HEAD, EYES, EARS, NOSE AND THROAT: Atraumatic, normocephalic. Extraocular muscles are intact. Pupils equal and reactive to light. Sclerae anicteric. No conjunctival injection. No oro-pharyngeal erythema.  NECK: Supple. There is no jugular venous distention. No bruits, no lymphadenopathy, no thyromegaly.  HEART: Regular rate and rhythm,. No murmurs, no rubs, no clicks.  LUNGS: Clear to auscultation bilaterally. No rales or rhonchi. No wheezes.  ABDOMEN: Soft, flat, nontender, nondistended. Has good bowel sounds. No hepatosplenomegaly appreciated.  EXTREMITIES: No evidence of any cyanosis, clubbing, or 2+ peripheral edema. +2 pedal and radial pulses bilaterally.  NEUROLOGIC: The patient is alert, awake, and oriented x3 with no focal motor or sensory deficits appreciated bilaterally.  SKIN: Moist and warm with no rashes appreciated.  Psych: Not anxious, depressed LN: No inguinal LN enlargement  DATA REVIEW:   CBC  Recent Labs Lab 07/24/15 0427   WBC 7.1  HGB 7.8*  HCT 23.5*  PLT 147*    Chemistries   Recent Labs Lab 07/23/15 0831 07/24/15 0427  NA 136 137  K 4.2 4.1  CL 101 102  CO2 28 29  GLUCOSE 117* 104*  BUN 46* 30*  CREATININE 4.67* 3.58*  CALCIUM 7.3* 7.5*  AST 16  --   ALT 13*  --   ALKPHOS 55  --   BILITOT 0.1*  --     Cardiac Enzymes  Recent Labs Lab 07/17/15 1129  TROPONINI 0.03*    Microbiology Results  Results for orders placed or performed during the hospital encounter of 03/15/15  MRSA PCR Screening     Status: None   Collection Time: 03/15/15  7:56 PM  Result Value Ref Range Status   MRSA by PCR NEGATIVE NEGATIVE Final    Comment:        The GeneXpert MRSA Assay (FDA approved for NASAL specimens only), is one component of a comprehensive MRSA colonization surveillance program. It is not intended to diagnose MRSA infection nor to guide or monitor treatment for MRSA infections.     RADIOLOGY:  No results found.  EKG:   Orders placed or performed during the hospital encounter of 07/17/15  . ED EKG  . ED EKG  . EKG 12-Lead  . EKG 12-Lead      Management plans discussed with the patient, family and they are in agreement.  CODE STATUS:     Code Status Orders        Start     Ordered   07/17/15 1241  Full code   Continuous     07/17/15 1244    Code Status History    Date Active Date Inactive Code Status Order ID Comments User Context   03/15/2015  7:56 PM 03/20/2015  3:48 PM Full Code [id number]  Idelle Crouch, MD Inpatient      TOTAL TIME TAKING CARE OF THIS PATIENT: 35 minutes.    Vaughan Basta M.D on 07/24/2015 at 11:15 AM  Between 7am to 6pm - Pager - [phone]  After 6pm go to [email or web address] - password [email or web address] [facility]  Office  [phone]  CC: Primary care physician; Christie Nottingham., PA   Note: This dictation was prepared with Dragon dictation along with smaller phrase technology. Any transcriptional errors  that result from this process are unintentional.

## 2015-07-24 NOTE — Progress Notes (Signed)
Patient left the hospital AMA.  Her discharged was canceled at the last minutes because she changed with dialysis center she wanted to go to and the center center was not yet ready to tx her.  Dr. Anselm Jungling was notifed. Patient was to be instructed to contact the new center in the morning and finish the process.  Patient stated she had to go home to care for her mother and grandchildren so that her husband could go back to work. Instruction provided regarding dialysis, care and trouble shooting of the perm cath, and dialysis diet.  Despite direct information about risks of eating foods high in potassium, it seemed the patient was go ahead and eat tomatoes as she has been doing in the past. Sent home with her daughter.

## 2015-07-24 NOTE — Progress Notes (Signed)
Patient is alert and oriented. Denies pain.  Is hoping to be discharged after hemodialysis today.  Is arranging for transport etc.

## 2015-07-24 NOTE — Progress Notes (Signed)
Pre-hd tx 

## 2015-07-24 NOTE — Progress Notes (Signed)
Post dialysis 

## 2015-07-24 NOTE — Progress Notes (Signed)
Dialysis complete

## 2015-07-30 ENCOUNTER — Encounter: Payer: Self-pay | Admitting: Family

## 2015-07-30 ENCOUNTER — Ambulatory Visit: Payer: Medicaid Other | Attending: Family | Admitting: Family

## 2015-07-30 VITALS — BP 184/76 | HR 71 | Resp 20 | Ht 69.0 in | Wt 225.0 lb

## 2015-07-30 DIAGNOSIS — R0602 Shortness of breath: Secondary | ICD-10-CM | POA: Diagnosis not present

## 2015-07-30 DIAGNOSIS — R002 Palpitations: Secondary | ICD-10-CM | POA: Diagnosis not present

## 2015-07-30 DIAGNOSIS — I272 Other secondary pulmonary hypertension: Secondary | ICD-10-CM | POA: Insufficient documentation

## 2015-07-30 DIAGNOSIS — N183 Chronic kidney disease, stage 3 unspecified: Secondary | ICD-10-CM

## 2015-07-30 DIAGNOSIS — Z7982 Long term (current) use of aspirin: Secondary | ICD-10-CM | POA: Insufficient documentation

## 2015-07-30 DIAGNOSIS — J449 Chronic obstructive pulmonary disease, unspecified: Secondary | ICD-10-CM | POA: Diagnosis not present

## 2015-07-30 DIAGNOSIS — E669 Obesity, unspecified: Secondary | ICD-10-CM | POA: Insufficient documentation

## 2015-07-30 DIAGNOSIS — Z833 Family history of diabetes mellitus: Secondary | ICD-10-CM | POA: Insufficient documentation

## 2015-07-30 DIAGNOSIS — Z992 Dependence on renal dialysis: Secondary | ICD-10-CM | POA: Insufficient documentation

## 2015-07-30 DIAGNOSIS — I10 Essential (primary) hypertension: Secondary | ICD-10-CM

## 2015-07-30 DIAGNOSIS — Z888 Allergy status to other drugs, medicaments and biological substances status: Secondary | ICD-10-CM | POA: Insufficient documentation

## 2015-07-30 DIAGNOSIS — I130 Hypertensive heart and chronic kidney disease with heart failure and stage 1 through stage 4 chronic kidney disease, or unspecified chronic kidney disease: Secondary | ICD-10-CM | POA: Diagnosis not present

## 2015-07-30 DIAGNOSIS — Z6833 Body mass index (BMI) 33.0-33.9, adult: Secondary | ICD-10-CM | POA: Insufficient documentation

## 2015-07-30 DIAGNOSIS — E1122 Type 2 diabetes mellitus with diabetic chronic kidney disease: Secondary | ICD-10-CM | POA: Insufficient documentation

## 2015-07-30 DIAGNOSIS — R42 Dizziness and giddiness: Secondary | ICD-10-CM | POA: Diagnosis not present

## 2015-07-30 DIAGNOSIS — Z959 Presence of cardiac and vascular implant and graft, unspecified: Secondary | ICD-10-CM | POA: Insufficient documentation

## 2015-07-30 DIAGNOSIS — I5032 Chronic diastolic (congestive) heart failure: Secondary | ICD-10-CM | POA: Insufficient documentation

## 2015-07-30 DIAGNOSIS — R5383 Other fatigue: Secondary | ICD-10-CM | POA: Diagnosis not present

## 2015-07-30 DIAGNOSIS — Z87891 Personal history of nicotine dependence: Secondary | ICD-10-CM | POA: Insufficient documentation

## 2015-07-30 DIAGNOSIS — Z8249 Family history of ischemic heart disease and other diseases of the circulatory system: Secondary | ICD-10-CM | POA: Diagnosis not present

## 2015-07-30 NOTE — Patient Instructions (Addendum)
Continue weighing daily and call for an overnight weight gain of > 2 pounds or a weekly weight gain of >5 pounds.  Bring medications to every visit 

## 2015-07-30 NOTE — Progress Notes (Signed)
Subjective:    Patient ID: Jacqueline Rodriguez, female    DOB: 02-19-1960, 55 y.o.   MRN: [id number]  Congestive Heart Failure Presents for follow-up visit. The disease course has been stable. Associated symptoms include fatigue, palpitations (with overexertion) and shortness of breath (easily). Pertinent negatives include no abdominal pain, chest pain, chest pressure, edema, muscle weakness or orthopnea. The symptoms have been stable. Past treatments include salt and fluid restriction and ACE inhibitors. The treatment provided mild relief. Compliance with prior treatments has been variable. Prior compliance problems include difficulty understanding directions. Her past medical history is significant for anemia, chronic lung disease, DM and HTN. She has one 1st degree relative with heart disease. Compliance with total regimen is 26-50%. Compliance with medications is 26-50%.  Hypertension This is a chronic problem. The current episode started more than 1 year ago. The problem has been waxing and waning since onset. The problem is uncontrolled. Associated symptoms include malaise/fatigue, palpitations (with overexertion) and shortness of breath (easily). Pertinent negatives include no chest pain, headaches, neck pain or peripheral edema. There are no associated agents to hypertension. Risk factors for coronary artery disease include diabetes mellitus, family history and stress. Past treatments include ACE inhibitors, diuretics and lifestyle changes. The current treatment provides mild improvement. Compliance problems include psychosocial issues.  Hypertensive end-organ damage includes kidney disease and heart failure.    Past Medical History  Diagnosis Date  . Essential hypertension   . Diabetes mellitus with renal complications ([place])   . Chronic diastolic CHF (congestive heart failure) ([place])   . CKD (chronic kidney disease), stage III   . Chronic bronchitis ([place])   . Pulmonary hypertension ([place])   .  Obesity     Past Surgical History  Procedure Laterality Date  . Peripheral vascular catheterization N/A 07/21/2015    Procedure: Dialysis/Perma Catheter;  Surgeon: Algernon Huxley, MD;  Location: [facility];  Service: Cardiovascular;  Laterality: N/A;    Family History  Problem Relation Age of Onset  . Hypertension Mother   . Diabetes Mellitus II Mother   . CAD Father   . Hypertension Father   . Heart attack Father     Social History  Substance Use Topics  . Smoking status: Former Smoker    Types: Cigarettes    Quit date: 03/24/1985  . Smokeless tobacco: Never Used  . Alcohol Use: No    Allergies  Allergen Reactions  . Ambien [Zolpidem] Other (See Comments)    hallucinations    Prior to Admission medications   Medication Sig Start Date End Date Taking? Authorizing Provider  albuterol (PROVENTIL HFA;VENTOLIN HFA) 108 (90 Base) MCG/ACT inhaler Inhale 2 puffs into the lungs every 6 (six) hours as needed for wheezing or shortness of breath. 01/08/15  Yes Gregor Hams, MD  amLODipine (NORVASC) 5 MG tablet Take 1 tablet (5 mg total) by mouth daily. 07/24/15  Yes Vaughan Basta, MD  aspirin EC 81 MG EC tablet Take 1 tablet (81 mg total) by mouth daily. 03/20/15  Yes Vaughan Basta, MD  gabapentin (NEURONTIN) 100 MG capsule Take 100 mg by mouth 3 (three) times daily as needed. For neuropathy. 03/05/15  Yes Historical Provider, MD  glipiZIDE (GLUCOTROL) 5 MG tablet Take 5 mg by mouth daily before breakfast.    Yes Historical Provider, MD  lisinopril (PRINIVIL,ZESTRIL) 10 MG tablet Take 10 mg by mouth daily.   Yes Historical Provider, MD     Review of Systems  Constitutional: Positive for malaise/fatigue  and fatigue. Negative for appetite change.  HENT: Positive for rhinorrhea and sore throat (with oxygen). Negative for congestion.   Eyes: Negative.   Respiratory: Positive for shortness of breath (easily). Negative for cough and chest tightness.    Cardiovascular: Positive for palpitations (with overexertion). Negative for chest pain and leg swelling.  Gastrointestinal: Negative for abdominal pain and abdominal distention.  Endocrine: Negative.   Genitourinary: Negative.   Musculoskeletal: Negative for back pain, muscle weakness and neck pain.  Skin: Negative.   Allergic/Immunologic: Negative.   Neurological: Positive for dizziness. Negative for light-headedness and headaches.  Hematological: Negative for adenopathy. Does not bruise/bleed easily.  Psychiatric/Behavioral: Negative for sleep disturbance (sleeping on 2 pillows; taking melatonin nightly) and dysphoric mood. The patient is not nervous/anxious.        Objective:   Physical Exam  Constitutional: She is oriented to person, place, and time. She appears well-developed and well-nourished.  HENT:  Head: Normocephalic and atraumatic.  Eyes: Conjunctivae are normal. Pupils are equal, round, and reactive to light.  Neck: Normal range of motion. Neck supple.  Cardiovascular: Normal rate and regular rhythm.   Pulmonary/Chest: Effort normal. She has no wheezes. She has no rales.  Abdominal: Soft. She exhibits no distension. There is no tenderness.  Musculoskeletal: She exhibits no edema or tenderness.  Neurological: She is alert and oriented to person, place, and time.  Skin: Skin is warm and dry.  Psychiatric: She has a normal mood and affect. Her behavior is normal. Thought content normal.  Nursing note and vitals reviewed.   BP 184/76 mmHg  Pulse 71  Resp 20  Ht 5\' 9"  (1.753 m)  Wt 225 lb (102.059 kg)  BMI 33.21 kg/m2  SpO2 90%       Assessment & Plan:  1: Chronic heart failure with preserved ejection fraction- Patient presents with fatigue and shortness of breath with little exertion (Class III). Symptoms do improve upon rest. She denies any chest pain or edema in her abdomen or her ankles. She continues to weigh herself and says that she has gained some weight.  By our scale, she's gained 11 pounds since she was last here on 04/30/15. Reminded to call for an overnight weight gain of >2 pounds or a weekly weight gain of >5 pounds. She is not adding any salt to her food and uses Mrs. Dash for her seasoning. She has an appointment with her cardiologist on 08/06/15. Does wear her oxygen at 2 L at bedtime.  2: HTN- Blood pressure elevated in the office but she says that she hasn't taken her medications yet today. Discussed the importance of taking her medication prior to coming to the office so that her blood pressure can accurately be addressed. 3: Chronic kidney disease- She started outpatient dialysis yesterday and will be going on T, [place] & Sat. She currently has a port a cath for access. She says that she's been quite tired and slightly dizzy since dialysis. Discussed that dialysis can sometimes take awhile for her body to adjust.   Patient did not bring her medications nor a list. Each medication was verbally reviewed with the patient and she was encouraged to bring the bottles to every visit to confirm accuracy of list. What patient says that she's taking does not match what the discharge summary says nor does it match what her pharmacy says. Discussed the importance of her bringing her medications to every visit every time so that providers know what medications she is taking. Daughter-in-law that is  with her verbalizes understanding as well.   She sees her PCP on 08/01/15 and the discharge summary with medications listed was printed for her and told them to go home and see what her bottles actually say and to take her bottles and this list with her to her PCP's office so that he can reconcile what she's taking. Unable to confidently know what patient is taking.   Return here in 3 months or sooner for any questions/problems before then.

## 2015-08-06 ENCOUNTER — Ambulatory Visit (INDEPENDENT_AMBULATORY_CARE_PROVIDER_SITE_OTHER): Payer: BLUE CROSS/BLUE SHIELD | Admitting: Cardiology

## 2015-08-06 ENCOUNTER — Encounter: Payer: Self-pay | Admitting: Cardiology

## 2015-08-06 VITALS — BP 124/78 | HR 60 | Ht 69.0 in | Wt 213.0 lb

## 2015-08-06 DIAGNOSIS — I272 Other secondary pulmonary hypertension: Secondary | ICD-10-CM | POA: Diagnosis not present

## 2015-08-06 DIAGNOSIS — I10 Essential (primary) hypertension: Secondary | ICD-10-CM | POA: Diagnosis not present

## 2015-08-06 DIAGNOSIS — I5032 Chronic diastolic (congestive) heart failure: Secondary | ICD-10-CM

## 2015-08-06 DIAGNOSIS — I444 Left anterior fascicular block: Secondary | ICD-10-CM

## 2015-08-06 DIAGNOSIS — E669 Obesity, unspecified: Secondary | ICD-10-CM

## 2015-08-06 NOTE — Patient Instructions (Signed)
Testing/Procedures: Your physician has requested that you have an echocardiogram. Echocardiography is a painless test that uses sound waves to create images of your heart. It provides your doctor with information about the size and shape of your heart and how well your heart's chambers and valves are working. This procedure takes approximately one hour. There are no restrictions for this procedure.  Follow-Up: Your physician wants you to follow-up in: 3 months with Dr. Yvone Neu. You will receive a reminder letter in the mail two months in advance. If you don't receive a letter, please call our office to schedule the follow-up appointment.  It was a pleasure seeing you today here in the office. Please do not hesitate to give [place] a call back if you have any further questions. [place], BSN

## 2015-08-06 NOTE — Progress Notes (Signed)
Cardiology Office Note   Date:  08/06/2015   ID:  Jacqueline Rodriguez, DOB Dec 12, 1960, MRN [id number]  Referring Doctor:  Christie Nottingham., PA   Cardiologist:   Wende Bushy, MD   Reason for consultation:  Chief Complaint  Patient presents with  . Other    Follow per Darylene Price for CHF. Meds reviewed by the patient verbally.       History of Present Illness: Jacqueline Rodriguez is a 55 y.o. female who presents for Follow-up after hospital stay.  Since being discharged from the hospital, patient is now on regular hemodialysis Tuesdays Thursdays and Saturdays. He overall is feeling much better. She has modified her diet to get rid of sodium. She is following the recommendations of her nephrologist.  She is not very active but denies chest pain and shortness of breath. No fever, cough, colds, abdominal pain. Her weight has significantly improved. She has occasional leg swelling but this improves after dialysis.   ROS:  Please see the history of present illness. Aside from mentioned under HPI, all other systems are reviewed and negative.     Past Medical History:  Diagnosis Date  . Anemia   . Chronic bronchitis ([place])   . Chronic diastolic CHF (congestive heart failure) ([place])   . CKD (chronic kidney disease), stage III   . Diabetes mellitus with renal complications ([place])   . Essential hypertension   . Obesity   . Pulmonary hypertension ([place])     Past Surgical History:  Procedure Laterality Date  . PERIPHERAL VASCULAR CATHETERIZATION N/A 07/21/2015   Procedure: Dialysis/Perma Catheter;  Surgeon: Algernon Huxley, MD;  Location: [facility];  Service: Cardiovascular;  Laterality: N/A;     reports that she quit smoking about 30 years ago. Her smoking use included Cigarettes. She has never used smokeless tobacco. She reports that she does not drink alcohol or use drugs.   family history includes CAD in her father; Diabetes Mellitus II in her mother; Heart attack in her father;  Hypertension in her father and mother.   Outpatient Medications Prior to Visit  Medication Sig Dispense Refill  . albuterol (PROVENTIL HFA;VENTOLIN HFA) 108 (90 Base) MCG/ACT inhaler Inhale 2 puffs into the lungs every 6 (six) hours as needed for wheezing or shortness of breath. 1 Inhaler 2  . amLODipine (NORVASC) 5 MG tablet Take 1 tablet (5 mg total) by mouth daily. 30 tablet 0  . aspirin EC 81 MG EC tablet Take 1 tablet (81 mg total) by mouth daily. 30 tablet 0  . gabapentin (NEURONTIN) 100 MG capsule Take 100 mg by mouth 3 (three) times daily as needed. For neuropathy.  2  . glipiZIDE (GLUCOTROL) 5 MG tablet Take 5 mg by mouth daily before breakfast.     . lisinopril (PRINIVIL,ZESTRIL) 10 MG tablet Take 10 mg by mouth daily.     No facility-administered medications prior to visit.      Allergies: Ambien [zolpidem]    PHYSICAL EXAM: VS:  BP 124/78 (BP Location: Right Leg, Patient Position: Sitting, Cuff Size: Normal)   Pulse 60   Ht 5\' 9"  (1.753 m)   Wt 213 lb (96.6 kg)   BMI 31.45 kg/m  , Body mass index is 31.45 kg/m. Wt Readings from Last 3 Encounters:  08/06/15 213 lb (96.6 kg)  07/30/15 225 lb (102.1 kg)  07/24/15 234 lb 5.6 oz (106.3 kg)    GENERAL:  well developed, well nourished, obese, not in acute distress  HEENT: normocephalic, pink conjunctivae, anicteric sclerae, no xanthelasma, normal dentition, oropharynx clear NECK:  no neck vein engorgement, JVP normal, no hepatojugular reflux, carotid upstroke brisk and symmetric, no bruit, no thyromegaly, no lymphadenopathy LUNGS:  good respiratory effort, clear to auscultation bilaterally CV:  PMI not displaced, no thrills, no lifts, S1 and S2 within normal limits, no palpable S3 or S4, Soft systolic murmur likely TR, no rubs, no gallops ABD:  Soft, nontender, nondistended, normoactive bowel sounds, no abdominal aortic bruit, no hepatomegaly, no splenomegaly MS: nontender back, no kyphosis, no scoliosis, no joint  deformities EXT:  2+ DP/PT pulses, +1 edema, no varicosities, no cyanosis, no clubbing SKIN: warm, nondiaphoretic, normal turgor, no ulcers NEUROPSYCH: alert, oriented to person, place, and time, sensory/motor grossly intact, normal mood, appropriate affect  Recent Labs: 03/18/2015: Magnesium 2.2 07/17/2015: B Natriuretic Peptide 261.0 07/23/2015: ALT 13 07/24/2015: BUN 30; Creatinine, Ser 3.58; Hemoglobin 7.8; Platelets 147; Potassium 4.1; Sodium 137   Lipid Panel No results found for: CHOL, TRIG, HDL, CHOLHDL, VLDL, LDLCALC, LDLDIRECT   Other studies Reviewed:  EKG:  The ekg from 08/06/2015 was personally reviewed by me and it revealed sinus rhythm, 60 BPM. LAFB. Minimal voltage criteria for LVH. T-wave abnormality. No significant change from EKG from 07/25/2015.  Additional studies/ records that were reviewed personally reviewed by me today include:  Echo 07/18/2015: Left ventricle: The cavity size was mildly dilated. Wall   thickness was increased in a pattern of severe LVH. Systolic   function was normal. The estimated ejection fraction was 65%.   Regional wall motion abnormalities cannot be excluded. Doppler   parameters are consistent with abnormal left ventricular   relaxation (grade 1 diastolic dysfunction). - Aortic valve: Valve area (Vmax): 2.04 cm^2. - Mitral valve: Calcified annulus. Mildly thickened leaflets .   There was mild regurgitation. - Right ventricle: The cavity size was severely dilated. - Pulmonary arteries: PA peak pressure: 51 mm Hg (S).  ASSESSMENT AND PLAN: Volume overload, multifactorial: Chronic kidney disease disease/ESRD, on hemodialysis Congestive heart failure, diastolic dysfunction, chronic  Pulmonary hypertension  Noted on echocardiogram. 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 Recommend repeat echocardiogram to reassess PA systolic pressure once volume status is improved with dialysis.  Hypertension  Improved.  Continue medications.   Abnormal EKG LAFB Risk factors for CAD including diabetes, hypertension, obesity. Discussed recommendation of stress testing to rule out ischemia. Patient is reluctant to proceed right now. She understands why it is being recommended and understands risks of coronary artery disease. She would like to think about this first and will let us know if and when she would like to proceed.  Recommend outpatient evaluation for sleep apnea. Patient will like to discuss with PCP. Recommended LDL goal is less than 70 due to history of diabetes. Patient will also like to discuss with PCP.   Current medicines are reviewed at length with the patient today.  The patient does not have concerns regarding medicines.  Labs/ tests ordered today include:  Orders Placed This Encounter  Procedures  . EKG 12-Lead  . ECHOCARDIOGRAM COMPLETE    I had a lengthy and detailed discussion with the patient regarding diagnoses, prognosis, diagnostic options, treatment options , and side effects of medications.   I counseled the patient on importance of lifestyle modification including heart healthy diet, regular physical activity.   Disposition:   FU with undersigned In 3 months   Signed, Wende Bushy, MD  08/06/2015 2:05 PM    [facility]  This note was generated in part with voice recognition software and I apologize for any typographical errors that were not detected and corrected.

## 2015-08-11 DIAGNOSIS — N186 End stage renal disease: Secondary | ICD-10-CM | POA: Diagnosis not present

## 2015-08-11 DIAGNOSIS — Z992 Dependence on renal dialysis: Secondary | ICD-10-CM | POA: Diagnosis not present

## 2015-08-21 ENCOUNTER — Ambulatory Visit: Payer: BLUE CROSS/BLUE SHIELD | Admitting: Cardiology

## 2015-08-22 ENCOUNTER — Other Ambulatory Visit: Payer: BLUE CROSS/BLUE SHIELD

## 2015-08-26 ENCOUNTER — Other Ambulatory Visit: Payer: Self-pay | Admitting: Vascular Surgery

## 2015-08-27 ENCOUNTER — Encounter
Admission: RE | Admit: 2015-08-27 | Discharge: 2015-08-27 | Disposition: A | Discharge status: Home / Self Care | Payer: Medicaid Other | Source: Ambulatory Visit | Attending: Vascular Surgery | Admitting: Vascular Surgery

## 2015-08-27 DIAGNOSIS — Z01812 Encounter for preprocedural laboratory examination: Secondary | ICD-10-CM | POA: Diagnosis not present

## 2015-08-27 HISTORY — DX: Chronic obstructive pulmonary disease, unspecified: J44.9

## 2015-08-27 LAB — SURGICAL PCR SCREEN
MRSA, PCR: NEGATIVE
STAPHYLOCOCCUS AUREUS: POSITIVE — AB

## 2015-08-27 LAB — BASIC METABOLIC PANEL
Anion gap: 8 (ref 5–15)
BUN: 21 mg/dL — AB (ref 6–20)
CALCIUM: 8.6 mg/dL — AB (ref 8.9–10.3)
CO2: 29 mmol/L (ref 22–32)
Chloride: 104 mmol/L (ref 101–111)
Creatinine, Ser: 3.57 mg/dL — ABNORMAL HIGH (ref 0.44–1.00)
GFR calc Af Amer: 15 mL/min — ABNORMAL LOW (ref >60)
GFR, EST NON AFRICAN AMERICAN: 13 mL/min — AB (ref >60)
GLUCOSE: 89 mg/dL (ref 65–99)
Potassium: 4 mmol/L (ref 3.5–5.1)
Sodium: 141 mmol/L (ref 135–145)

## 2015-08-27 LAB — CBC WITH DIFFERENTIAL/PLATELET
BASOS ABS: 0.1 10*3/uL (ref 0–0.1)
BASOS PCT: 1 %
EOS PCT: 6 %
Eosinophils Absolute: 0.5 10*3/uL (ref 0–0.7)
HEMATOCRIT: 30.7 % — AB (ref 35.0–47.0)
Hemoglobin: 10 g/dL — ABNORMAL LOW (ref 12.0–16.0)
Lymphocytes Relative: 16 %
Lymphs Abs: 1.4 10*3/uL (ref 1.0–3.6)
MCH: 27.8 pg (ref 26.0–34.0)
MCHC: 32.8 g/dL (ref 32.0–36.0)
MCV: 84.9 fL (ref 80.0–100.0)
MONO ABS: 0.6 10*3/uL (ref 0.2–0.9)
MONOS PCT: 7 %
NEUTROS ABS: 6.3 10*3/uL (ref 1.4–6.5)
Neutrophils Relative %: 70 %
PLATELETS: 198 10*3/uL (ref 150–440)
RBC: 3.61 MIL/uL — ABNORMAL LOW (ref 3.80–5.20)
RDW: 17 % — AB (ref 11.5–14.5)
WBC: 8.8 10*3/uL (ref 3.6–11.0)

## 2015-08-27 LAB — TYPE AND SCREEN
ABO/RH(D): A NEG
ANTIBODY SCREEN: NEGATIVE

## 2015-08-27 LAB — APTT: APTT: 32 s (ref 24–36)

## 2015-08-27 LAB — PROTIME-INR
INR: 1.05
Prothrombin Time: 13.7 seconds (ref 11.4–15.2)

## 2015-08-27 NOTE — Pre-Procedure Instructions (Addendum)
Faxed clearance  request to patient cardiologist . Message left at Dr Lucky Cowboy office clearance needed.[name]

## 2015-08-27 NOTE — Patient Instructions (Signed)
Your procedure is scheduled on: Wednesday 09/10/15 Report to Day Surgery. 2ND FLOOR MEDICAL MALL ENTRANCE To find out your arrival time please call [phone] between 1PM - 3PM on Tuesday 09/09/15.  Remember: Instructions that are not followed completely may result in serious medical risk, up to and including death, or upon the discretion of your surgeon and anesthesiologist your surgery may need to be rescheduled.    __X__ 1. Do not eat food or drink liquids after midnight. No gum chewing or hard candies.     __X__ 2. No Alcohol for 24 hours before or after surgery.   ____ 3. Bring all medications with you on the day of surgery if instructed.    __X__ 4. Notify your doctor if there is any change in your medical condition     (cold, fever, infections).     Do not wear jewelry, make-up, hairpins, clips or nail polish.  Do not wear lotions, powders, or perfumes.   Do not shave 48 hours prior to surgery. Men may shave face and neck.  Do not bring valuables to the hospital.    [facility] is not responsible for any belongings or valuables.               Contacts, dentures or bridgework may not be worn into surgery.  Leave your suitcase in the car. After surgery it may be brought to your room.  For patients admitted to the hospital, discharge time is determined by your                treatment team.   Patients discharged the day of surgery will not be allowed to drive home.   Please read over the following fact sheets that you were given:   MRSA Information and Surgical Site Infection Prevention   __X__ Take these medicines the morning of surgery with A SIP OF WATER:    1. AMLODIPINE  2. CARVEDILOL  3. HYDRALAZINE  4. IISOSORBIDE  5. LISINOPRIL  6.  ____ Fleet Enema (as directed)   __X__ Use CHG Soap as directed/SAGE WIPES  ____ Use inhalers on the day of surgery  ____ Stop metformin 2 days prior to surgery    ____ Take 1/2 of usual insulin dose the night before surgery and  none on the morning of surgery.   ____ Stop Coumadin/Plavix/aspirin on   ____ Stop Anti-inflammatories on    __X__ Stop supplements until after surgery. STOP GLUCOSAMINE  ____ Bring C-Pap to the hospital.

## 2015-08-28 ENCOUNTER — Other Ambulatory Visit: Payer: BLUE CROSS/BLUE SHIELD

## 2015-08-28 ENCOUNTER — Telehealth: Payer: Self-pay | Admitting: Cardiology

## 2015-08-28 NOTE — Telephone Encounter (Signed)
Cardiac clearance forms received for surgery scheduled 09/10/15 for AV Fistula. Placed cardiac clearance forms in red folder in "To Do" bin on Amesha Bailey's desk. Also sent message for her to schedule follow up.

## 2015-08-29 ENCOUNTER — Telehealth: Payer: Self-pay | Admitting: Cardiology

## 2015-08-29 NOTE — Telephone Encounter (Signed)
Lmov to call back and schedule appointment for Surgery Clearance

## 2015-08-29 NOTE — Telephone Encounter (Signed)
-----   Message from Valora Corporal, RN sent at 08/28/2015  3:59 PM EDT ----- Regarding: appt This pt needs a follow up with [facility]. She also needs cardiac clearance for surgery scheduled for 09/10/15.   Thanks, Olin Hauser

## 2015-09-01 ENCOUNTER — Other Ambulatory Visit: Payer: BLUE CROSS/BLUE SHIELD

## 2015-09-02 ENCOUNTER — Telehealth: Payer: Self-pay | Admitting: Cardiology

## 2015-09-02 NOTE — Telephone Encounter (Signed)
Left detailed voicemail message for patient to call back and schedule appointment with Dr. Yvone Neu for cardiac clearance.

## 2015-09-06 NOTE — Progress Notes (Deleted)
Cardiology Office Note   Date:  09/06/2015   ID:  Jacqueline Rodriguez, DOB [birth year], MRN [id number]  Referring Doctor:  Christie Nottingham., PA   Cardiologist:   Wende Bushy, MD   Reason for consultation:  No chief complaint on file.     History of Present Illness: Jacqueline Rodriguez is a 55 y.o. female who presents for Follow-up after hospital stay.  Since being discharged from the hospital, patient is now on regular hemodialysis Tuesdays Thursdays and Saturdays. He overall is feeling much better. She has modified her diet to get rid of sodium. She is following the recommendations of her nephrologist.  She is not very active but denies chest pain and shortness of breath. No fever, cough, colds, abdominal pain. Her weight has significantly improved. She has occasional leg swelling but this improves after dialysis.   ROS:  Please see the history of present illness. Aside from mentioned under HPI, all other systems are reviewed and negative.     Past Medical History:  Diagnosis Date  . Anemia   . Chronic bronchitis ([place])   . Chronic diastolic CHF (congestive heart failure) ([place])   . CKD (chronic kidney disease), stage III   . COPD (chronic obstructive pulmonary disease) ([place])   . Diabetes mellitus with renal complications ([place])   . Essential hypertension   . Obesity   . Pulmonary hypertension ([place])     Past Surgical History:  Procedure Laterality Date  . PERIPHERAL VASCULAR CATHETERIZATION N/A 07/21/2015   Procedure: Dialysis/Perma Catheter;  Surgeon: Algernon Huxley, MD;  Location: [facility];  Service: Cardiovascular;  Laterality: N/A;     reports that she quit smoking about 30 years ago. Her smoking use included Cigarettes. She has never used smokeless tobacco. She reports that she does not drink alcohol or use drugs.   family history includes CAD in her father; Diabetes Mellitus II in her mother; Heart attack in her father; Hypertension in her father and mother.    Outpatient Medications Prior to Visit  Medication Sig Dispense Refill  . acetaminophen (TYLENOL) 500 MG tablet Take 500 mg by mouth every 6 (six) hours as needed.    [name] albuterol (PROVENTIL HFA;VENTOLIN HFA) 108 (90 Base) MCG/ACT inhaler Inhale 2 puffs into the lungs every 6 (six) hours as needed for wheezing or shortness of breath. 1 Inhaler 2  . amLODipine (NORVASC) 5 MG tablet Take 1 tablet (5 mg total) by mouth daily. 30 tablet 0  . aspirin EC 81 MG EC tablet Take 1 tablet (81 mg total) by mouth daily. 30 tablet 0  . Calcium-Magnesium-Vitamin D (CALCIUM 1200+D3 PO) Take 1 tablet by mouth daily.    . carvedilol (COREG) 6.25 MG tablet Take 6.25 mg by mouth 2 (two) times daily with a meal.    . cetirizine (ZYRTEC) 10 MG tablet Take 10 mg by mouth daily as needed for allergies.    . ferrous sulfate 325 (65 FE) MG tablet Take 325 mg by mouth 2 (two) times daily.    [name] gabapentin (NEURONTIN) 100 MG capsule Take 100 mg by mouth 3 (three) times daily as needed. For neuropathy.  2  . glipiZIDE (GLUCOTROL) 5 MG tablet Take 5 mg by mouth daily before breakfast.     . GLUCOSAMINE-CHONDROITIN PO Take 4,000 mg by mouth daily.    . hydrALAZINE (APRESOLINE) 25 MG tablet Take 25 mg by mouth 3 (three) times daily.    . isosorbide mononitrate (IMDUR) 30 MG 24  hr tablet Take 30 mg by mouth daily.    [name] lisinopril (PRINIVIL,ZESTRIL) 10 MG tablet Take 5 mg by mouth daily.     . Magnesium Oxide 250 MG TABS Take 1 tablet by mouth daily.     No facility-administered medications prior to visit.      Allergies: Ambien [zolpidem]    PHYSICAL EXAM: VS:  There were no vitals taken for this visit. , There is no height or weight on file to calculate BMI. Wt Readings from Last 3 Encounters:  08/27/15 217 lb (98.4 kg)  08/06/15 213 lb (96.6 kg)  07/30/15 225 lb (102.1 kg)    GENERAL:  well developed, well nourished, obese, not in acute distress HEENT: normocephalic, pink conjunctivae, anicteric sclerae, no  xanthelasma, normal dentition, oropharynx clear NECK:  no neck vein engorgement, JVP normal, no hepatojugular reflux, carotid upstroke brisk and symmetric, no bruit, no thyromegaly, no lymphadenopathy LUNGS:  good respiratory effort, clear to auscultation bilaterally CV:  PMI not displaced, no thrills, no lifts, S1 and S2 within normal limits, no palpable S3 or S4, Soft systolic murmur likely TR, no rubs, no gallops ABD:  Soft, nontender, nondistended, normoactive bowel sounds, no abdominal aortic bruit, no hepatomegaly, no splenomegaly MS: nontender back, no kyphosis, no scoliosis, no joint deformities EXT:  2+ DP/PT pulses, +1 edema, no varicosities, no cyanosis, no clubbing SKIN: warm, nondiaphoretic, normal turgor, no ulcers NEUROPSYCH: alert, oriented to person, place, and time, sensory/motor grossly intact, normal mood, appropriate affect  Recent Labs: 03/18/2015: Magnesium 2.2 07/17/2015: B Natriuretic Peptide 261.0 07/23/2015: ALT 13 08/27/2015: BUN 21; Creatinine, Ser 3.57; Hemoglobin 10.0; Platelets 198; Potassium 4.0; Sodium 141   Lipid Panel No results found for: CHOL, TRIG, HDL, CHOLHDL, VLDL, LDLCALC, LDLDIRECT   Other studies Reviewed:  EKG:  The ekg from 08/06/2015 was personally reviewed by me and it revealed sinus rhythm, 60 BPM. LAFB. Minimal voltage criteria for LVH. T-wave abnormality. No significant change from EKG from 07/25/2015.  Additional studies/ records that were reviewed personally reviewed by me today include:  Echo 07/18/2015: Left ventricle: The cavity size was mildly dilated. Wall   thickness was increased in a pattern of severe LVH. Systolic   function was normal. The estimated ejection fraction was 65%.   Regional wall motion abnormalities cannot be excluded. Doppler   parameters are consistent with abnormal left ventricular   relaxation (grade 1 diastolic dysfunction). - Aortic valve: Valve area (Vmax): 2.04 cm^2. - Mitral valve: Calcified annulus. Mildly  thickened leaflets .   There was mild regurgitation. - Right ventricle: The cavity size was severely dilated. - Pulmonary arteries: PA peak pressure: 51 mm Hg (S).  ASSESSMENT AND PLAN: Volume overload, multifactorial: Chronic kidney disease disease/ESRD, on hemodialysis Congestive heart failure, diastolic dysfunction, chronic  Pulmonary hypertension  Noted on echocardiogram. 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 Recommend repeat echocardiogram to reassess PA systolic pressure once volume status is improved with dialysis.  Hypertension  Improved. Continue medications.   Abnormal EKG LAFB Risk factors for CAD including diabetes, hypertension, obesity. Discussed recommendation of stress testing to rule out ischemia. Patient is reluctant to proceed right now. She understands why it is being recommended and understands risks of coronary artery disease. She would like to think about this first and will let us know if and when she would like to proceed.  Recommend outpatient evaluation for sleep apnea. Patient will like to discuss with PCP. Recommended LDL goal is less than 70 due to history of diabetes.  Patient will also like to discuss with PCP.   Current medicines are reviewed at length with the patient today.  The patient does not have concerns regarding medicines.  Labs/ tests ordered today include:  No orders of the defined types were placed in this encounter.   I had a lengthy and detailed discussion with the patient regarding diagnoses, prognosis, diagnostic options, treatment options , and side effects of medications.   I counseled the patient on importance of lifestyle modification including heart healthy diet, regular physical activity.   Disposition:   FU with undersigned In 3 months   Signed, Wende Bushy, MD  09/06/2015 12:17 PM    [facility]  This note was generated in part with voice recognition software  and I apologize for any typographical errors that were not detected and corrected.

## 2015-09-09 ENCOUNTER — Ambulatory Visit: Payer: BLUE CROSS/BLUE SHIELD | Admitting: Cardiology

## 2015-09-10 ENCOUNTER — Encounter: Payer: Self-pay | Admitting: Cardiology

## 2015-09-10 ENCOUNTER — Ambulatory Visit: Payer: BLUE CROSS/BLUE SHIELD | Admitting: Cardiology

## 2015-09-10 ENCOUNTER — Telehealth: Payer: Self-pay | Admitting: Cardiology

## 2015-09-10 ENCOUNTER — Ambulatory Visit (INDEPENDENT_AMBULATORY_CARE_PROVIDER_SITE_OTHER): Payer: BLUE CROSS/BLUE SHIELD | Admitting: Cardiology

## 2015-09-10 VITALS — BP 122/64 | HR 77 | Ht 69.0 in | Wt 211.5 lb

## 2015-09-10 DIAGNOSIS — N183 Chronic kidney disease, stage 3 unspecified: Secondary | ICD-10-CM

## 2015-09-10 DIAGNOSIS — I10 Essential (primary) hypertension: Secondary | ICD-10-CM

## 2015-09-10 DIAGNOSIS — I444 Left anterior fascicular block: Secondary | ICD-10-CM | POA: Diagnosis not present

## 2015-09-10 DIAGNOSIS — E669 Obesity, unspecified: Secondary | ICD-10-CM | POA: Diagnosis not present

## 2015-09-10 DIAGNOSIS — I5032 Chronic diastolic (congestive) heart failure: Secondary | ICD-10-CM

## 2015-09-10 DIAGNOSIS — Z01818 Encounter for other preprocedural examination: Secondary | ICD-10-CM

## 2015-09-10 MED ORDER — CHLORHEXIDINE GLUCONATE CLOTH 2 % EX PADS: 6.0000 | MEDICATED_PAD | Freq: Once | CUTANEOUS | Status: DC

## 2015-09-10 MED ORDER — SODIUM CHLORIDE 0.9 % IV SOLN: INTRAVENOUS | Status: DC

## 2015-09-10 MED ORDER — FAMOTIDINE 20 MG PO TABS
20.0000 mg | ORAL_TABLET | Freq: Once | ORAL | Status: AC
  Administered 2015-10-01: 20 mg via ORAL

## 2015-09-10 MED ORDER — CEFAZOLIN SODIUM-DEXTROSE 2-4 GM/100ML-% IV SOLN: 2.0000 g | INTRAVENOUS | Status: AC

## 2015-09-10 MED ORDER — BUPIVACAINE-EPINEPHRINE (PF) 0.5% -1:200000 IJ SOLN
INTRAMUSCULAR | Status: AC
  Filled 2015-09-10: qty 30

## 2015-09-10 MED ORDER — HEPARIN SODIUM (PORCINE) 5000 UNIT/ML IJ SOLN
INTRAMUSCULAR | Status: AC
  Filled 2015-09-10: qty 1

## 2015-09-10 MED ORDER — PAPAVERINE HCL 30 MG/ML IJ SOLN
INTRAMUSCULAR | Status: AC
  Filled 2015-09-10: qty 2

## 2015-09-10 NOTE — Patient Instructions (Addendum)
Testing/Procedures: [place]  Your caregiver has ordered a Stress Test with nuclear imaging. The purpose of this test is to evaluate the blood supply to your heart muscle. This procedure is referred to as a "Non-Invasive Stress Test." This is because other than having an IV started in your vein, nothing is inserted or "invades" your body. Cardiac stress tests are done to find areas of poor blood flow to the heart by determining the extent of coronary artery disease (CAD). Some patients exercise on a treadmill, which naturally increases the blood flow to your heart, while others who are  unable to walk on a treadmill due to physical limitations have a pharmacologic/chemical stress agent called Lexiscan . This medicine will mimic walking on a treadmill by temporarily increasing your coronary blood flow.   Please note: these test may take anywhere between 2-4 hours to complete  PLEASE REPORT TO [facility] AT THE FIRST DESK WILL DIRECT YOU WHERE TO GO  Date of Procedure:__Friday September 19, 2015 at 08:30AM___  Arrival Time for Procedure:_Arrive at 08:15AM to register___  Instructions regarding medication:   __X__:  Hold Carvedilol (Coreg) the night before procedure and morning of procedure    PLEASE NOTIFY THE OFFICE AT LEAST 24 HOURS IN ADVANCE IF YOU ARE UNABLE TO KEEP YOUR APPOINTMENT.  [phone] AND  PLEASE NOTIFY NUCLEAR MEDICINE AT [facility] AT LEAST 24 HOURS IN ADVANCE IF YOU ARE UNABLE TO KEEP YOUR APPOINTMENT. [phone]  How to prepare for your Myoview test:  1. Do not eat or drink after midnight 2. No caffeine for 24 hours prior to test 3. No smoking 24 hours prior to test. 4. Your medication may be taken with water.  If your doctor stopped a medication because of this test, do not take that medication. 5. Ladies, please do not wear dresses.  Skirts or pants are appropriate. Please wear a short sleeve shirt. 6. No perfume, cologne or  lotion. 7. Wear comfortable walking shoes. No heels!   Follow-Up: Your physician recommends that you schedule a follow-up appointment in: 3 months with Dr. Yvone Neu.   It was a pleasure seeing you today here in the office. Please do not hesitate to give [place] a call back if you have any further questions. [place], BSN      Pharmacologic Stress Electrocardiogram A pharmacologic stress electrocardiogram is a heart (cardiac) test that uses nuclear imaging to evaluate the blood supply to your heart. This test may also be called a pharmacologic stress electrocardiography. Pharmacologic means that a medicine is used to increase your heart rate and blood pressure.  This stress test is done to find areas of poor blood flow to the heart by determining the extent of coronary artery disease (CAD). Some people exercise on a treadmill, which naturally increases the blood flow to the heart. For those people unable to exercise on a treadmill, a medicine is used. This medicine stimulates your heart and will cause your heart to beat harder and more quickly, as if you were exercising.  Pharmacologic stress tests can help determine:  The adequacy of blood flow to your heart during increased levels of activity in order to clear you for discharge home.  The extent of coronary artery blockage caused by CAD.  Your prognosis if you have suffered a heart attack.  The effectiveness of cardiac procedures done, such as an angioplasty, which can increase the circulation in your coronary arteries.  Causes of chest pain or  pressure. LET [facility] CARE PROVIDER KNOW ABOUT:  Any allergies you have.  All medicines you are taking, including vitamins, herbs, eye drops, creams, and over-the-counter medicines.  Previous problems you or members of your family have had with the use of anesthetics.  Any blood disorders you have.  Previous surgeries you have had.  Medical conditions you  have.  Possibility of pregnancy, if this applies.  If you are currently breastfeeding. RISKS AND COMPLICATIONS Generally, this is a safe procedure. However, as with any procedure, complications can occur. Possible complications include:  You develop pain or pressure in the following areas:  Chest.  Jaw or neck.  Between your shoulder blades.  Radiating down your left arm.  Headache.  Dizziness or light-headedness.  Shortness of breath.  Increased or irregular heartbeat.  Low blood pressure.  Nausea or vomiting.  Flushing.  Redness going up the arm and slight pain during injection of medicine.  Heart attack (rare). BEFORE THE PROCEDURE   Avoid all forms of caffeine for 24 hours before your test or as directed by your health care provider. This includes coffee, tea (even decaffeinated tea), caffeinated sodas, chocolate, cocoa, and certain pain medicines.  Follow your health care provider's instructions regarding eating and drinking before the test.  Take your medicines as directed at regular times with water unless instructed otherwise. Exceptions may include:  If you have diabetes, ask how you are to take your insulin or pills. It is common to adjust insulin dosing the morning of the test.  If you are taking beta-blocker medicines, it is important to talk to your health care provider about these medicines well before the date of your test. Taking beta-blocker medicines may interfere with the test. In some cases, these medicines need to be changed or stopped 24 hours or more before the test.  If you wear a nitroglycerin patch, it may need to be removed prior to the test. Ask your health care provider if the patch should be removed before the test.  If you use an inhaler for any breathing condition, bring it with you to the test.  If you are an outpatient, bring a snack so you can eat right after the stress phase of the test.  Do not smoke for 4 hours prior to the  test or as directed by your health care provider.  Do not apply lotions, powders, creams, or oils on your chest prior to the test.  Wear comfortable shoes and clothing. Let your health care provider know if you were unable to complete or follow the preparations for your test. PROCEDURE   Multiple patches (electrodes) will be put on your chest. If needed, small areas of your chest may be shaved to get better contact with the electrodes. Once the electrodes are attached to your body, multiple wires will be attached to the electrodes, and your heart rate will be monitored.  An IV access will be started. A nuclear trace (isotope) is given. The isotope may be given intravenously, or it may be swallowed. Nuclear refers to several types of radioactive isotopes, and the nuclear isotope lights up the arteries so that the nuclear images are clear. The isotope is absorbed by your body. This results in low radiation exposure.  A resting nuclear image is taken to show how your heart functions at rest.  A medicine is given through the IV access.  A second scan is done about 1 hour after the medicine injection and determines how your heart functions under  stress.  During this stress phase, you will be connected to an electrocardiogram machine. Your blood pressure and oxygen levels will be monitored. AFTER THE PROCEDURE   Your heart rate and blood pressure will be monitored after the test.  You may return to your normal schedule, including diet,activities, and medicines, unless your health care provider tells you otherwise.   This information is not intended to replace advice given to you by your health care provider. Make sure you discuss any questions you have with your health care provider.   Document Released: 05/16/2008 Document Revised: 01/02/2013 Document Reviewed: 09/04/2012 Elsevier Interactive Patient Education [employer].

## 2015-09-10 NOTE — Telephone Encounter (Signed)
Cardiac clearance form faxed to [facility]. Patient is scheduled for echo 09/17/15 and stress test 09/19/15. Office visit note will be updated once these have been completed. Placed original form in "Faxed" bin on Victoria Henshaw's desk.

## 2015-09-10 NOTE — Pre-Procedure Instructions (Signed)
Surgeon's office not aware patient not cleared for surgery. This nurse notified ebony at dr dew on 09/01/15 at 1620 that patient did not show for echo at cardiologist x2 and was rescheduled for 09/17/15 for echo. See note 09/02/15 where dr Yvone Neu trying to contact patient to have echo and to be cleared. Left message at dr dew's office on nurse line of above previous info

## 2015-09-10 NOTE — Progress Notes (Signed)
Cardiology Office Note   Date:  09/10/2015   ID:  Jacqueline Rodriguez, DOB 14-Jun-1960, MRN [id number]  Referring Doctor:  Christie Nottingham., PA   Cardiologist:   Wende Bushy, MD   Reason for consultation:  Chief Complaint  Patient presents with  . Other    Cardiac clearance for fistula. Pt would like to discuss cardiac medications. Meds reviewed verbally with pt.      History of Present Illness: Jacqueline Rodriguez is a 55 y.o. female who presents for Follow-up For preoperative evaluation   On her last visit, a stress is was recommended because of her abnormal EKG. Patient wanted to hold off. She didn't want to undergo stress test. Her father had a major heart attack after  a stress test and she cannot get that off her mind. She is not having chest pain or shortness of breath.   ROS:  Please see the history of present illness. Aside from mentioned under HPI, all other systems are reviewed and negative.     Past Medical History:  Diagnosis Date  . Anemia   . Chronic bronchitis ([place])   . Chronic diastolic CHF (congestive heart failure) ([place])   . CKD (chronic kidney disease), stage III   . COPD (chronic obstructive pulmonary disease) ([place])   . Diabetes mellitus with renal complications ([place])   . Essential hypertension   . Obesity   . Pulmonary hypertension ([place])     Past Surgical History:  Procedure Laterality Date  . PERIPHERAL VASCULAR CATHETERIZATION N/A 07/21/2015   Procedure: Dialysis/Perma Catheter;  Surgeon: Algernon Huxley, MD;  Location: [facility];  Service: Cardiovascular;  Laterality: N/A;     reports that she quit smoking about 30 years ago. Her smoking use included Cigarettes. She has never used smokeless tobacco. She reports that she does not drink alcohol or use drugs.   family history includes CAD in her father; Diabetes Mellitus II in her mother; Heart attack in her father; Hypertension in her father and mother.   Outpatient Medications Prior to Visit    Medication Sig Dispense Refill  . acetaminophen (TYLENOL) 500 MG tablet Take 500 mg by mouth every 6 (six) hours as needed.    [name] albuterol (PROVENTIL HFA;VENTOLIN HFA) 108 (90 Base) MCG/ACT inhaler Inhale 2 puffs into the lungs every 6 (six) hours as needed for wheezing or shortness of breath. 1 Inhaler 2  . amLODipine (NORVASC) 5 MG tablet Take 1 tablet (5 mg total) by mouth daily. 30 tablet 0  . aspirin EC 81 MG EC tablet Take 1 tablet (81 mg total) by mouth daily. 30 tablet 0  . Calcium-Magnesium-Vitamin D (CALCIUM 1200+D3 PO) Take 1 tablet by mouth daily.    . carvedilol (COREG) 6.25 MG tablet Take 6.25 mg by mouth 2 (two) times daily with a meal.    . cetirizine (ZYRTEC) 10 MG tablet Take 10 mg by mouth daily as needed for allergies.    . ferrous sulfate 325 (65 FE) MG tablet Take 325 mg by mouth 2 (two) times daily.    [name] gabapentin (NEURONTIN) 100 MG capsule Take 100 mg by mouth 3 (three) times daily as needed. For neuropathy.  2  . glipiZIDE (GLUCOTROL) 5 MG tablet Take 5 mg by mouth daily before breakfast.     . GLUCOSAMINE-CHONDROITIN PO Take 4,000 mg by mouth daily.    . hydrALAZINE (APRESOLINE) 25 MG tablet Take 25 mg by mouth 3 (three) times daily.    [name]  isosorbide mononitrate (IMDUR) 30 MG 24 hr tablet Take 30 mg by mouth daily.    [name] lisinopril (PRINIVIL,ZESTRIL) 10 MG tablet Take 10 mg by mouth every other day.     . Magnesium Oxide 250 MG TABS Take 1 tablet by mouth daily.     Facility-Administered Medications Prior to Visit  Medication Dose Route Frequency Provider Last Rate Last Dose  . 0.9 %  sodium chloride infusion   Intravenous Continuous Precious Haws Piscitello, MD      . ceFAZolin (ANCEF) IVPB 2g/100 mL premix  2 g Intravenous On Call to [place], PA-C      . Chlorhexidine Gluconate Cloth 2 % PADS 6 each  6 each Topical Once [employer], PA-C       And  . Chlorhexidine Gluconate Cloth 2 % PADS 6 each  6 each Topical Once [employer], PA-C       . famotidine (PEPCID) tablet 20 mg  20 mg Oral Once Andria Frames, MD         Allergies: Ambien [zolpidem]    PHYSICAL EXAM: VS:  BP 122/64 (BP Location: Left Arm, Patient Position: Sitting, Cuff Size: Large)   Pulse 77   Ht 5\' 9"  (1.753 m)   Wt 211 lb 8 oz (95.9 kg)   BMI 31.23 kg/m  , Body mass index is 31.23 kg/m. Wt Readings from Last 3 Encounters:  09/10/15 211 lb 8 oz (95.9 kg)  08/27/15 217 lb (98.4 kg)  08/06/15 213 lb (96.6 kg)    GENERAL:  well developed, well nourished, obese, not in acute distress HEENT: normocephalic, pink conjunctivae, anicteric sclerae, no xanthelasma, normal dentition, oropharynx clear NECK:  no neck vein engorgement, JVP normal, no hepatojugular reflux, carotid upstroke brisk and symmetric, no bruit, no thyromegaly, no lymphadenopathy LUNGS:  good respiratory effort, clear to auscultation bilaterally CV:  PMI not displaced, no thrills, no lifts, S1 and S2 within normal limits, no palpable S3 or S4, Soft systolic murmur likely TR, no rubs, no gallops ABD:  Soft, nontender, nondistended, normoactive bowel sounds, no abdominal aortic bruit, no hepatomegaly, no splenomegaly MS: nontender back, no kyphosis, no scoliosis, no joint deformities EXT:  2+ DP/PT pulses, +1 edema, no varicosities, no cyanosis, no clubbing SKIN: warm, nondiaphoretic, normal turgor, no ulcers NEUROPSYCH: alert, oriented to person, place, and time, sensory/motor grossly intact, normal mood, appropriate affect  Recent Labs: 03/18/2015: Magnesium 2.2 07/17/2015: B Natriuretic Peptide 261.0 07/23/2015: ALT 13 08/27/2015: BUN 21; Creatinine, Ser 3.57; Hemoglobin 10.0; Platelets 198; Potassium 4.0; Sodium 141   Lipid Panel No results found for: CHOL, TRIG, HDL, CHOLHDL, VLDL, LDLCALC, LDLDIRECT   Other studies Reviewed:  EKG:  The ekg from 08/06/2015 was personally reviewed by me and it revealed sinus rhythm, 60 BPM. LAFB. Minimal voltage criteria for LVH. T-wave  abnormality. No significant change from EKG from 07/25/2015.  EKG from 09/10/2015 was personally reviewed by me and it revealed sinus rhythm, 77 BPM. LAFB. Minimal voltage criteria for LVH. QT recalculated at 400 ms, QTC 4:30 milliseconds.  Additional studies/ records that were reviewed personally reviewed by me today include:  Echo 07/18/2015: Left ventricle: The cavity size was mildly dilated. Wall   thickness was increased in a pattern of severe LVH. Systolic   function was normal. The estimated ejection fraction was 65%.   Regional wall motion abnormalities cannot be excluded. Doppler   parameters are consistent with abnormal left ventricular   relaxation (grade 1 diastolic dysfunction). -  Aortic valve: Valve area (Vmax): 2.04 cm^2. - Mitral valve: Calcified annulus. Mildly thickened leaflets .   There was mild regurgitation. - Right ventricle: The cavity size was severely dilated. - Pulmonary arteries: PA peak pressure: 51 mm Hg (S).  ASSESSMENT AND PLAN: Volume overload, multifactorial: Chronic kidney disease disease/ESRD, on hemodialysis Congestive heart failure, diastolic dysfunction, chronic  Pulmonary hypertension  Noted on echocardiogram. 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 Recommend repeat echocardiogram to reassess PA systolic pressure once volume status is improved with dialysis.  Hypertension  Improved. Continue medications.   Abnormal EKG LAFB Risk factors for CAD including diabetes, hypertension, obesity. Discussed recommendation of stress testing to rule out ischemia. I had a lengthy discussion with the patient regarding the nature of the procedure, why it is indicated, risks. Patient verbalized understanding and finally would like to proceed with stress testing. We will need to do this as part of preoperative evaluation for AV fistula.  Recommend outpatient evaluation for sleep apnea. Patient will like to discuss with  PCP. Recommended LDL goal is less than 70 due to history of diabetes. Patient will also like to discuss with PCP.   Current medicines are reviewed at length with the patient today.  The patient does not have concerns regarding medicines.  Labs/ tests ordered today include:  Orders Placed This Encounter  Procedures  . EKG 12-Lead    I had a lengthy and detailed discussion with the patient regarding diagnoses, prognosis, diagnostic options, treatment options , and side effects of medications.   I counseled the patient on importance of lifestyle modification including heart healthy diet, regular physical activity.   Disposition:   FU with undersigned In 3 months   Signed, Wende Bushy, MD  09/10/2015 12:09 PM    [place]  This note was generated in part with voice recognition software and I apologize for any typographical errors that were not detected and corrected.

## 2015-09-10 NOTE — OR Nursing (Signed)
Attempted to call patient on her cell phone number, (preferred according to her)to notify her that her surgery is being cancelled today since she had not had her echo as scheduled.  Left message on her phone asking her to call Dr. Ozella Almond' office to reschedule surgery once the echo was done.  I then called Dr. Ozella Almond' office to tell them about situation and to make sure she did have cardiac clearance prior to setting a rescheduled date.  Dr. Amie Critchley and Dr. Lucky Cowboy had previously spoken together about this surgery.  Dr. Lucky Cowboy was okay if she was cancelled and Dr. Mamie Nick said he would not provide anesthesia until she had been cleared.

## 2015-09-11 DIAGNOSIS — Z992 Dependence on renal dialysis: Secondary | ICD-10-CM | POA: Diagnosis not present

## 2015-09-11 DIAGNOSIS — N186 End stage renal disease: Secondary | ICD-10-CM | POA: Diagnosis not present

## 2015-09-12 ENCOUNTER — Telehealth: Payer: Self-pay | Admitting: Cardiology

## 2015-09-12 NOTE — Telephone Encounter (Signed)
Patient had an automated call reminding her of upcoming echocardiogram here next Wednesday. She confirmed appointment and had no further questions at this time.

## 2015-09-12 NOTE — Telephone Encounter (Signed)
Patient returning someones call from [facility]care .

## 2015-09-17 ENCOUNTER — Other Ambulatory Visit: Payer: Self-pay

## 2015-09-17 ENCOUNTER — Other Ambulatory Visit: Payer: Self-pay | Admitting: Cardiology

## 2015-09-17 ENCOUNTER — Ambulatory Visit (INDEPENDENT_AMBULATORY_CARE_PROVIDER_SITE_OTHER): Payer: BLUE CROSS/BLUE SHIELD

## 2015-09-17 DIAGNOSIS — I272 Other secondary pulmonary hypertension: Secondary | ICD-10-CM | POA: Diagnosis not present

## 2015-09-18 ENCOUNTER — Telehealth: Payer: Self-pay | Admitting: Cardiology

## 2015-09-18 NOTE — Telephone Encounter (Signed)
Reviewed myoview instructions w/pt who verbalized understanding.  

## 2015-09-19 ENCOUNTER — Encounter
Admission: RE | Admit: 2015-09-19 | Discharge: 2015-09-19 | Disposition: A | Discharge status: Home / Self Care | Payer: Medicaid Other | Source: Ambulatory Visit | Attending: Cardiology | Admitting: Cardiology

## 2015-09-19 ENCOUNTER — Telehealth: Payer: Self-pay | Admitting: Cardiology

## 2015-09-19 DIAGNOSIS — I130 Hypertensive heart and chronic kidney disease with heart failure and stage 1 through stage 4 chronic kidney disease, or unspecified chronic kidney disease: Secondary | ICD-10-CM | POA: Diagnosis not present

## 2015-09-19 DIAGNOSIS — I10 Essential (primary) hypertension: Secondary | ICD-10-CM

## 2015-09-19 DIAGNOSIS — N289 Disorder of kidney and ureter, unspecified: Secondary | ICD-10-CM

## 2015-09-19 DIAGNOSIS — I5032 Chronic diastolic (congestive) heart failure: Secondary | ICD-10-CM | POA: Insufficient documentation

## 2015-09-19 DIAGNOSIS — N183 Chronic kidney disease, stage 3 unspecified: Secondary | ICD-10-CM

## 2015-09-19 HISTORY — DX: Disorder of kidney and ureter, unspecified: N28.9

## 2015-09-19 LAB — NM MYOCAR MULTI W/SPECT W/WALL MOTION / EF
CHL CUP NUCLEAR SDS: 5
CHL CUP RESTING HR STRESS: 68 {beats}/min
CSEPPHR: 85 {beats}/min
LVDIAVOL: 116 mL (ref 46–106)
LVSYSVOL: 54 mL
Percent HR: 52 %
SRS: 15
SSS: 12
TID: 1.02

## 2015-09-19 MED ORDER — TECHNETIUM TC 99M TETROFOSMIN IV KIT
30.0000 | PACK | Freq: Once | INTRAVENOUS | Status: AC | PRN
  Administered 2015-09-19: 28.54 via INTRAVENOUS

## 2015-09-19 MED ORDER — REGADENOSON 0.4 MG/5ML IV SOLN
0.4000 mg | Freq: Once | INTRAVENOUS | Status: AC
  Administered 2015-09-19: 0.4 mg via INTRAVENOUS

## 2015-09-19 MED ORDER — TECHNETIUM TC 99M TETROFOSMIN IV KIT
13.0000 | PACK | Freq: Once | INTRAVENOUS | Status: AC | PRN
  Administered 2015-09-19: 12.883 via INTRAVENOUS

## 2015-09-19 NOTE — Telephone Encounter (Signed)
Received records request FROM Disability Determination services , forwarded to [facility] for processing.

## 2015-09-24 ENCOUNTER — Ambulatory Visit: Payer: BLUE CROSS/BLUE SHIELD | Admitting: Cardiology

## 2015-09-25 NOTE — Pre-Procedure Instructions (Signed)
NOT SEEN 09/24/15 BY DR Lucky Cowboy FOR H/P. IS RESCHEDULED PREOP

## 2015-09-29 NOTE — OR Nursing (Signed)
Per Charlena Cross at Dr. Ozella Almond office, patient to have preop appointment with Dr. Lucky Cowboy on 09/30/15 at 1pm.

## 2015-10-01 ENCOUNTER — Encounter
Admission: RE | Disposition: A | Discharge status: Home / Self Care | Payer: Self-pay | Source: Ambulatory Visit | Attending: Vascular Surgery

## 2015-10-01 ENCOUNTER — Ambulatory Visit: Payer: Medicaid Other | Admitting: Anesthesiology

## 2015-10-01 ENCOUNTER — Telehealth: Payer: Self-pay | Admitting: Cardiology

## 2015-10-01 ENCOUNTER — Encounter: Payer: Self-pay | Admitting: *Deleted

## 2015-10-01 ENCOUNTER — Ambulatory Visit
Admission: RE | Admit: 2015-10-01 | Discharge: 2015-10-01 | Disposition: A | Discharge status: Home / Self Care | Payer: Medicaid Other | Source: Ambulatory Visit | Attending: Vascular Surgery | Admitting: Vascular Surgery

## 2015-10-01 DIAGNOSIS — I509 Heart failure, unspecified: Secondary | ICD-10-CM | POA: Diagnosis not present

## 2015-10-01 DIAGNOSIS — I132 Hypertensive heart and chronic kidney disease with heart failure and with stage 5 chronic kidney disease, or end stage renal disease: Secondary | ICD-10-CM | POA: Insufficient documentation

## 2015-10-01 DIAGNOSIS — Z79899 Other long term (current) drug therapy: Secondary | ICD-10-CM | POA: Diagnosis not present

## 2015-10-01 DIAGNOSIS — N183 Chronic kidney disease, stage 3 unspecified: Secondary | ICD-10-CM

## 2015-10-01 DIAGNOSIS — Z833 Family history of diabetes mellitus: Secondary | ICD-10-CM | POA: Insufficient documentation

## 2015-10-01 DIAGNOSIS — N179 Acute kidney failure, unspecified: Secondary | ICD-10-CM

## 2015-10-01 DIAGNOSIS — J449 Chronic obstructive pulmonary disease, unspecified: Secondary | ICD-10-CM | POA: Insufficient documentation

## 2015-10-01 DIAGNOSIS — Z7982 Long term (current) use of aspirin: Secondary | ICD-10-CM | POA: Diagnosis not present

## 2015-10-01 DIAGNOSIS — Z8249 Family history of ischemic heart disease and other diseases of the circulatory system: Secondary | ICD-10-CM | POA: Diagnosis not present

## 2015-10-01 DIAGNOSIS — E1122 Type 2 diabetes mellitus with diabetic chronic kidney disease: Secondary | ICD-10-CM | POA: Diagnosis not present

## 2015-10-01 DIAGNOSIS — N186 End stage renal disease: Secondary | ICD-10-CM | POA: Diagnosis present

## 2015-10-01 DIAGNOSIS — D649 Anemia, unspecified: Secondary | ICD-10-CM | POA: Insufficient documentation

## 2015-10-01 DIAGNOSIS — E669 Obesity, unspecified: Secondary | ICD-10-CM | POA: Insufficient documentation

## 2015-10-01 HISTORY — PX: AV FISTULA PLACEMENT: SHX1204

## 2015-10-01 LAB — POCT I-STAT 4, (NA,K, GLUC, HGB,HCT)
Glucose, Bld: 226 mg/dL — ABNORMAL HIGH (ref 65–99)
HCT: 31 % — ABNORMAL LOW (ref 36.0–46.0)
Hemoglobin: 10.5 g/dL — ABNORMAL LOW (ref 12.0–15.0)
POTASSIUM: 4.5 mmol/L (ref 3.5–5.1)
SODIUM: 138 mmol/L (ref 135–145)

## 2015-10-01 LAB — TYPE AND SCREEN
ABO/RH(D): A NEG
ANTIBODY SCREEN: NEGATIVE

## 2015-10-01 LAB — GLUCOSE, CAPILLARY
GLUCOSE-CAPILLARY: 241 mg/dL — AB (ref 65–99)
GLUCOSE-CAPILLARY: 190 mg/dL — AB (ref 65–99)

## 2015-10-01 SURGERY — ARTERIOVENOUS (AV) FISTULA CREATION
Anesthesia: General | Laterality: Left | Wound class: Clean

## 2015-10-01 MED ORDER — PROMETHAZINE HCL 25 MG/ML IJ SOLN: 6.2500 mg | INTRAMUSCULAR | Status: DC | PRN

## 2015-10-01 MED ORDER — OXYCODONE HCL 5 MG/5ML PO SOLN: 5.0000 mg | Freq: Once | ORAL | Status: DC | PRN

## 2015-10-01 MED ORDER — MIDAZOLAM HCL 5 MG/5ML IJ SOLN
1.0000 mg | Freq: Once | INTRAMUSCULAR | Status: AC
  Administered 2015-10-01: 1 mg via INTRAVENOUS

## 2015-10-01 MED ORDER — FAMOTIDINE 20 MG PO TABS
20.0000 mg | ORAL_TABLET | Freq: Once | ORAL | Status: AC
  Administered 2015-10-01: 20 mg via ORAL

## 2015-10-01 MED ORDER — MIDAZOLAM HCL 2 MG/2ML IJ SOLN
INTRAMUSCULAR | Status: DC | PRN
  Administered 2015-10-01: 1 mg via INTRAVENOUS

## 2015-10-01 MED ORDER — SODIUM CHLORIDE 0.9 % IV SOLN
INTRAVENOUS | Status: DC
  Administered 2015-10-01: 10:00:00 via INTRAVENOUS

## 2015-10-01 MED ORDER — PROPOFOL 500 MG/50ML IV EMUL
INTRAVENOUS | Status: DC | PRN
  Administered 2015-10-01: 25 ug/kg/min via INTRAVENOUS

## 2015-10-01 MED ORDER — FENTANYL CITRATE (PF) 100 MCG/2ML IJ SOLN
INTRAMUSCULAR | Status: AC
  Administered 2015-10-01: 50 ug via INTRAVENOUS
  Filled 2015-10-01: qty 2

## 2015-10-01 MED ORDER — HEPARIN SODIUM (PORCINE) 5000 UNIT/ML IJ SOLN
INTRAMUSCULAR | Status: AC
  Filled 2015-10-01: qty 1

## 2015-10-01 MED ORDER — ROPIVACAINE HCL 5 MG/ML IJ SOLN
INTRAMUSCULAR | Status: DC | PRN
  Administered 2015-10-01: 30 mL via PERINEURAL

## 2015-10-01 MED ORDER — HEPARIN SODIUM (PORCINE) 1000 UNIT/ML IJ SOLN
INTRAMUSCULAR | Status: DC | PRN
  Administered 2015-10-01: 11:00:00 via INTRAMUSCULAR

## 2015-10-01 MED ORDER — ONDANSETRON HCL 4 MG/2ML IJ SOLN
INTRAMUSCULAR | Status: DC | PRN
  Administered 2015-10-01: 4 mg via INTRAVENOUS

## 2015-10-01 MED ORDER — MIDAZOLAM HCL 5 MG/5ML IJ SOLN
2.0000 mg | Freq: Once | INTRAMUSCULAR | Status: AC
  Administered 2015-10-01: 1 mg via INTRAVENOUS

## 2015-10-01 MED ORDER — FENTANYL CITRATE (PF) 100 MCG/2ML IJ SOLN: 25.0000 ug | INTRAMUSCULAR | Status: DC | PRN

## 2015-10-01 MED ORDER — CEFAZOLIN SODIUM-DEXTROSE 2-4 GM/100ML-% IV SOLN
2.0000 g | Freq: Once | INTRAVENOUS | Status: AC
  Administered 2015-10-01: 2 g via INTRAVENOUS

## 2015-10-01 MED ORDER — BUPIVACAINE-EPINEPHRINE (PF) 0.5% -1:200000 IJ SOLN
INTRAMUSCULAR | Status: DC | PRN
  Administered 2015-10-01: 10 mL

## 2015-10-01 MED ORDER — HEPARIN SODIUM (PORCINE) 1000 UNIT/ML IJ SOLN
INTRAMUSCULAR | Status: DC | PRN
  Administered 2015-10-01: 3000 [IU] via INTRAVENOUS

## 2015-10-01 MED ORDER — LIDOCAINE HCL (PF) 1 % IJ SOLN
INTRAMUSCULAR | Status: AC
  Filled 2015-10-01: qty 5

## 2015-10-01 MED ORDER — BUPIVACAINE-EPINEPHRINE (PF) 0.5% -1:200000 IJ SOLN
INTRAMUSCULAR | Status: AC
  Filled 2015-10-01: qty 30

## 2015-10-01 MED ORDER — ROPIVACAINE HCL 5 MG/ML IJ SOLN
INTRAMUSCULAR | Status: AC
  Filled 2015-10-01: qty 40

## 2015-10-01 MED ORDER — CEFAZOLIN SODIUM-DEXTROSE 2-4 GM/100ML-% IV SOLN
INTRAVENOUS | Status: AC
  Filled 2015-10-01: qty 100

## 2015-10-01 MED ORDER — FENTANYL CITRATE (PF) 100 MCG/2ML IJ SOLN
50.0000 ug | Freq: Once | INTRAMUSCULAR | Status: AC
  Administered 2015-10-01: 50 ug via INTRAVENOUS

## 2015-10-01 MED ORDER — HYDROCODONE-ACETAMINOPHEN 5-325 MG PO TABS: 1.0000 | ORAL_TABLET | Freq: Four times a day (QID) | ORAL | 0 refills | Status: DC | PRN

## 2015-10-01 MED ORDER — MIDAZOLAM HCL 5 MG/5ML IJ SOLN
INTRAMUSCULAR | Status: AC
  Administered 2015-10-01: 1 mg via INTRAVENOUS
  Filled 2015-10-01: qty 5

## 2015-10-01 MED ORDER — FAMOTIDINE 20 MG PO TABS
ORAL_TABLET | ORAL | Status: AC
  Filled 2015-10-01: qty 1

## 2015-10-01 MED ORDER — LABETALOL HCL 5 MG/ML IV SOLN
INTRAVENOUS | Status: DC | PRN
  Administered 2015-10-01: 5 mg via INTRAVENOUS

## 2015-10-01 MED ORDER — OXYCODONE HCL 5 MG PO TABS: 5.0000 mg | ORAL_TABLET | Freq: Once | ORAL | Status: DC | PRN

## 2015-10-01 MED ORDER — PAPAVERINE HCL 30 MG/ML IJ SOLN
INTRAMUSCULAR | Status: AC
  Filled 2015-10-01: qty 2

## 2015-10-01 MED ORDER — MEPERIDINE HCL 25 MG/ML IJ SOLN: 6.2500 mg | INTRAMUSCULAR | Status: DC | PRN

## 2015-10-01 SURGICAL SUPPLY — 49 items
BAG DECANTER FOR FLEXI CONT (MISCELLANEOUS) ×3 IMPLANT
BLADE SURG SZ11 CARB STEEL (BLADE) ×3 IMPLANT
BOOT SUTURE AID YELLOW STND (SUTURE) ×3 IMPLANT
BRUSH SCRUB 4% CHG (MISCELLANEOUS) ×3 IMPLANT
CANISTER SUCT 1200ML W/VALVE (MISCELLANEOUS) ×3 IMPLANT
CHLORAPREP W/TINT 26ML (MISCELLANEOUS) ×3 IMPLANT
CLIP SPRNG 6MM S-JAW DBL (CLIP) ×3
ELECT CAUTERY BLADE 6.4 (BLADE) ×3 IMPLANT
ELECT REM PT RETURN 9FT ADLT (ELECTROSURGICAL) ×3
ELECTRODE REM PT RTRN 9FT ADLT (ELECTROSURGICAL) ×1 IMPLANT
GEL ULTRASOUND 20GR AQUASONIC (MISCELLANEOUS) IMPLANT
GLOVE BIO SURGEON STRL SZ7 (GLOVE) ×9 IMPLANT
GLOVE INDICATOR 7.5 STRL GRN (GLOVE) IMPLANT
GOWN STRL REUS W/ TWL LRG LVL3 (GOWN DISPOSABLE) ×1 IMPLANT
GOWN STRL REUS W/ TWL XL LVL3 (GOWN DISPOSABLE) ×1 IMPLANT
GOWN STRL REUS W/TWL LRG LVL3 (GOWN DISPOSABLE) ×2
GOWN STRL REUS W/TWL XL LVL3 (GOWN DISPOSABLE) ×2
HEMOSTAT SURGICEL 2X3 (HEMOSTASIS) ×3 IMPLANT
IV NS 500ML (IV SOLUTION) ×2
IV NS 500ML BAXH (IV SOLUTION) ×1 IMPLANT
KIT RM TURNOVER STRD PROC AR (KITS) ×3 IMPLANT
LABEL OR SOLS (LABEL) ×3 IMPLANT
LIQUID BAND (GAUZE/BANDAGES/DRESSINGS) ×3 IMPLANT
LOOP RED MAXI  1X406MM (MISCELLANEOUS) ×2
LOOP VESSEL MAXI 1X406 RED (MISCELLANEOUS) ×1 IMPLANT
LOOP VESSEL MINI 0.8X406 BLUE (MISCELLANEOUS) ×1 IMPLANT
LOOPS BLUE MINI 0.8X406MM (MISCELLANEOUS) ×2
NEEDLE FILTER BLUNT 18X 1/2SAF (NEEDLE) ×2
NEEDLE FILTER BLUNT 18X1 1/2 (NEEDLE) ×1 IMPLANT
NEEDLE HYPO 30X.5 LL (NEEDLE) IMPLANT
NS IRRIG 500ML POUR BTL (IV SOLUTION) ×3 IMPLANT
PACK EXTREMITY ARMC (MISCELLANEOUS) ×3 IMPLANT
PAD PREP 24X41 OB/GYN DISP (PERSONAL CARE ITEMS) ×3 IMPLANT
SOLUTION CELL SAVER (CLIP) ×1 IMPLANT
STOCKINETTE STRL 4IN 9604848 (GAUZE/BANDAGES/DRESSINGS) ×3 IMPLANT
SUT MNCRL AB 4-0 PS2 18 (SUTURE) ×3 IMPLANT
SUT PROLENE 6 0 BV (SUTURE) ×6 IMPLANT
SUT SILK 2 0 (SUTURE) ×2
SUT SILK 2-0 18XBRD TIE 12 (SUTURE) ×1 IMPLANT
SUT SILK 3 0 (SUTURE) ×2
SUT SILK 3-0 18XBRD TIE 12 (SUTURE) ×1 IMPLANT
SUT SILK 4 0 (SUTURE) ×2
SUT SILK 4-0 18XBRD TIE 12 (SUTURE) ×1 IMPLANT
SUT VIC AB 3-0 SH 27 (SUTURE) ×2
SUT VIC AB 3-0 SH 27X BRD (SUTURE) ×1 IMPLANT
SYR 20CC LL (SYRINGE) ×3 IMPLANT
SYR 3ML LL SCALE MARK (SYRINGE) ×3 IMPLANT
SYR TB 1ML 27GX1/2 LL (SYRINGE) IMPLANT
TOWEL OR 17X26 4PK STRL BLUE (TOWEL DISPOSABLE) IMPLANT

## 2015-10-01 NOTE — Anesthesia Preprocedure Evaluation (Signed)
Anesthesia Evaluation  Patient identified by MRN, date of birth, ID band Patient awake    Reviewed: Allergy & Precautions, NPO status , Patient's Chart, lab work & pertinent test results  History of Anesthesia Complications Negative for: history of anesthetic complications  Airway Mallampati: II  TM Distance: >3 FB Neck ROM: Full    Dental  (+) Poor Dentition   Pulmonary shortness of breath, COPD,  COPD inhaler, former smoker,    breath sounds clear to auscultation- rhonchi (-) wheezing      Cardiovascular hypertension, Pt. on medications +CHF (preserved EF)  (-) CAD and (-) Past MI  Rhythm:Regular Rate:Normal - Systolic murmurs and - Diastolic murmurs Echo 05/14/59: - Left ventricle: The cavity size was normal. Wall thickness was   increased in a pattern of moderate LVH. Systolic function was   normal. The estimated ejection fraction was in the range of 55%   to 60%. Wall motion was normal; there were no regional wall   motion abnormalities. - Mitral valve: There was mild regurgitation. - Left atrium: The atrium was mildly dilated. - Right ventricle: The cavity size was mildly dilated. - Right atrium: The atrium was mildly dilated. - Tricuspid valve: There was moderate regurgitation.   Neuro/Psych negative neurological ROS  negative psych ROS   GI/Hepatic negative GI ROS, Neg liver ROS,   Endo/Other  diabetes, Type 2, Oral Hypoglycemic Agents  Renal/GU ESRF and DialysisRenal disease     Musculoskeletal negative musculoskeletal ROS (+)   Abdominal (+) + obese,   Peds  Hematology  (+) anemia ,   Anesthesia Other Findings Past Medical History: No date: Anemia No date: Chronic bronchitis (HCC) No date: Chronic diastolic CHF (congestive heart failur* No date: CKD (chronic kidney disease), stage III No date: COPD (chronic obstructive pulmonary disease) (* No date: Diabetes mellitus with renal complications  (HC* No date: Essential hypertension No date: Obesity No date: Pulmonary hypertension ([place]) 09/19/2015: Renal insufficiency     Comment: Stage 3 CKD. Beginning dialysis.   Reproductive/Obstetrics                             Anesthesia Physical Anesthesia Plan  ASA: IV  Anesthesia Plan: General   Post-op Pain Management:  Regional for Post-op pain   Induction:   Airway Management Planned: Natural Airway  Additional Equipment:   Intra-op Plan:   Post-operative Plan:   Informed Consent: I have reviewed the patients History and Physical, chart, labs and discussed the procedure including the risks, benefits and alternatives for the proposed anesthesia with the patient or authorized representative who has indicated his/her understanding and acceptance.   Dental advisory given  Plan Discussed with: CRNA and Anesthesiologist  Anesthesia Plan Comments:         Anesthesia Quick Evaluation

## 2015-10-01 NOTE — Telephone Encounter (Signed)
Received records request from Disability Determination Services , forwarded to [facility] for processing. ° °

## 2015-10-01 NOTE — Progress Notes (Signed)
Patient taken to PACU for block.  Dr. Randa Lynn Did not want any carvedilol, will give in OR If needed By patient.

## 2015-10-01 NOTE — Transfer of Care (Signed)
Immediate Anesthesia Transfer of Care Note  Patient: Jacqueline Rodriguez  Procedure(s) Performed: Procedure(s): ARTERIOVENOUS (AV) FISTULA CREATION ( BRACHIAL CEPHALIC ) (Left)  Patient Location: PACU  Anesthesia Type:MAC and Regional  Level of Consciousness: awake, alert  and oriented  Airway & Oxygen Therapy: Patient Spontanous Breathing and Patient connected to nasal cannula oxygen  Post-op Assessment: Report given to RN and Post -op Vital signs reviewed and stable  Post vital signs: Reviewed and stable  Last Vitals:  Vitals:   10/01/15 1002 10/01/15 1126  BP: (!) 246/63 (!) (P) 179/77  Pulse: 67   Resp: 16   Temp:  (P) 36.2 C    Last Pain:  Vitals:   10/01/15 0909  TempSrc: Tympanic         Complications: No apparent anesthesia complications

## 2015-10-01 NOTE — Progress Notes (Signed)
Dr. Randa Lynn in with patient, aware patient has Been out of carvedilol for over a week now and couple Other meds.

## 2015-10-01 NOTE — Discharge Instructions (Signed)
AMBULATORY SURGERY  DISCHARGE INSTRUCTIONS   1) The drugs that you were given will stay in your system until tomorrow so for the next 24 hours you should not:  A) Drive an automobile B) Make any legal decisions C) Drink any alcoholic beverage   2) You may resume regular meals tomorrow.  Today it is better to start with liquids and gradually work up to solid foods.  You may eat anything you prefer, but it is better to start with liquids, then soup and crackers, and gradually work up to solid foods.   3) Please notify your doctor immediately if you have any unusual bleeding, trouble breathing, redness and pain at the surgery site, drainage, fever, or pain not relieved by medication.   Please contact your physician with any problems or Same Day Surgery at [phone], Monday through Friday 6 am to 4 pm, or [facility] number at [phone].  AV Fistula, Care After Refer to this sheet in the next few weeks. These instructions provide you with information on caring for yourself after your procedure. Your caregiver may also give you more specific instructions. Your treatment has been planned according to current medical practices, but problems sometimes occur. Call your caregiver if you have any problems or questions after your procedure. HOME CARE INSTRUCTIONS   Have a responsible person with you.  Ask your caregiver to show you how to check your access at home for a vibration (called a "thrill") or for a sound (called a "bruit" pronounced brew-ee).  Your vein will need time to enlarge and mature so needles can be inserted for dialysis. Follow your caregiver's instructions about what you need to do to make this happen.  Keep incision clean and dry.  Keep the arm elevated above your heart. Use a pillow.  Rest.  Use the arm as usual for all activities.  Do not sleep or lie on the area of the fistula or that arm. This may decrease or stop the blood flow through your  fistula.  Do not allow blood pressures to be taken on this arm.  Do not allow blood drawing to be done from the graft.  Do not wear tight clothing around the access site or on the arm.  Avoid lifting heavy objects with the arm that has the fistula.  Do not use creams or lotions over the access site. SEEK MEDICAL CARE IF:   You have a fever.  You have swelling around the fistula that gets worse, or you have new pain.  You have unusual bleeding at the fistula site or from any other area.  You have pus or other drainage at the fistula site.  You have skin redness or red streaking on the skin around, above, or below the fistula site.  Your access site feels warm.  You have any flu-like symptoms. SEEK IMMEDIATE MEDICAL CARE IF:   You have pain, numbness, or an unusual pale skin on the hand or on the side of your fistula.  You have dizziness or weakness that you have not had before.  You have shortness of breath.  You have chest pain.  Your fistula disconnects or breaks, and there is bleeding that cannot be easily controlled. Call for local emergency medical help. Do not try to drive yourself to the hospital. MAKE SURE YOU  Understand these instructions.  Will watch your condition.  Will get help right away if you are not doing well or get worse.   This information is  not intended to replace advice given to you by your health care provider. Make sure you discuss any questions you have with your health care provider.   Document Released: 12/28/2004 Document Revised: 01/18/2014 Document Reviewed: 06/17/2010 Elsevier Interactive Patient Education [employer].

## 2015-10-01 NOTE — Anesthesia Postprocedure Evaluation (Signed)
Anesthesia Post Note  Patient: Jacqueline Rodriguez  Procedure(s) Performed: Procedure(s) (LRB): ARTERIOVENOUS (AV) FISTULA CREATION ( BRACHIAL CEPHALIC ) (Left)  Patient location during evaluation: PACU Anesthesia Type: General Level of consciousness: awake and alert and oriented Pain management: pain level controlled Vital Signs Assessment: post-procedure vital signs reviewed and stable Respiratory status: spontaneous breathing, nonlabored ventilation and respiratory function stable Cardiovascular status: blood pressure returned to baseline and stable Postop Assessment: no signs of nausea or vomiting Anesthetic complications: no    Last Vitals:  Vitals:   10/01/15 1214 10/01/15 1240  BP: (!) 178/72 (!) 193/76  Pulse: 64 65  Resp: 16 17  Temp: 36.4 C     Last Pain:  Vitals:   10/01/15 1214  TempSrc: Oral                 Brayli Klingbeil

## 2015-10-01 NOTE — Progress Notes (Signed)
Patient states she did not take any meds this am. States she has been out of a couple of meds.  Will Let anesthesia know.

## 2015-10-01 NOTE — Op Note (Addendum)
[facility]   OPERATIVE NOTE   PROCEDURE: Left brachiocephalic arteriovenous fistula placement  PRE-OPERATIVE DIAGNOSIS: 1.  ESRD  POST-OPERATIVE DIAGNOSIS: 1. ESRD  SURGEON: Leotis Pain, MD  ASSISTANT(S): none  ANESTHESIA: general  ESTIMATED BLOOD LOSS: minimal  FINDING(S): Adequate cephalic vein for fistula creation  SPECIMEN(S):  none  INDICATIONS:   Jacqueline Rodriguez is a 55 y.o. female who presents with severe renal failure needing dialysis and in need of pemanent dialysis acces.  The patient is scheduled for left arm AVF placement.  The patient is aware the risks include but are not limited to: bleeding, infection, steal syndrome, nerve damage, ischemic monomelic neuropathy, failure to mature, and need for additional procedures.  The patient is aware of the risks of the procedure and elects to proceed forward.  DESCRIPTION: After full informed written consent was obtained from the patient, the patient was brought back to the operating room and placed supine upon the operating table.  Prior to induction, the patient received IV antibiotics.   After obtaining adequate anesthesia, the patient was then prepped and draped in the standard fashion for a left arm access procedure.  I made a curvilinear incision at the level of the antecubital fossa and dissected through the subcutaneous tissue and fascia to gain exposure of the brachial artery.  This was noted to be patent and adequate in size for fistula creation.  This was dissected out proximally and distally and prepared for control with vessel loops .  I then dissected out the cephalic vein.  This was noted to be patent and adequate in size for fistula creation.  I then gave the patient 3000 units of intravenous heparin.  The vein was marked for orientation and the distal segment of the vein was ligated with a  2-0 silk, and the vein was transected.  I then instilled the heparinized saline into the vein and clamped  it.  At this point, I reset my exposure of the brachial artery and pulled up control on the vessel loops.  I made an arteriotomy with a #11 blade, and then I extended the arteriotomy with a Potts scissor.  I injected heparinized saline proximal and distal to this arteriotomy.  The vein was then sewn to the artery in an end-to-side configuration with a running stitch of 6-0 Prolene.  Prior to completing this anastomosis, I allowed the vein and artery to backbleed.  There was no evidence of clot from any vessels.  I completed the anastomosis in the usual fashion and then released all vessel loops and clamps.  There was a palpable  thrill in the venous outflow, and there was a palpable pulse in the artery distal to the anastomosis.  At this point, I irrigated out the surgical wound.  Surgicel was placed. There was no further active bleeding.  The subcutaneous tissue was reapproximated with a running stitch of 3-0 Vicryl.  The skin was then closed with a 4-0 Monocryl suture.  The skin was then cleaned, dried, and reinforced with Dermabond.  The patient tolerated this procedure well and was taken to the recovery room in stable condition  COMPLICATIONS: None  CONDITION: Stable   Jacqueline Rodriguez    10/01/2015, 11:16 AM

## 2015-10-01 NOTE — H&P (Signed)
[facility] History & Physical Update  The patient was interviewed and re-examined.  The patient's previous History and Physical has been reviewed and is unchanged.  There is no change in the plan of care. We plan to proceed with the scheduled procedure.  Jabier Deese, MD  10/01/2015, 9:08 AM

## 2015-10-01 NOTE — Anesthesia Procedure Notes (Signed)
Anesthesia Regional Block:  Supraclavicular block  Pre-Anesthetic Checklist: ,, timeout performed, Correct Patient, Correct Site, Correct Laterality, Correct Procedure, Correct Position, site marked, Risks and benefits discussed,  Surgical consent,  Pre-op evaluation,  At surgeon's request and post-op pain management  Laterality: Left  Prep: chloraprep       Needles:  Injection technique: Single-shot  Needle Type: Stimiplex     Needle Length: 5cm 5 cm Needle Gauge: 22 and 22 G    Additional Needles:  Procedures: ultrasound guided (picture in chart) Supraclavicular block Narrative:  Start time: 10/01/2015 9:55 AM End time: 10/01/2015 10:05 AM Injection made incrementally with aspirations every 5 mL.  Performed by: Personally  Anesthesiologist: Santosh Petter  Additional Notes: Functioning IV was confirmed and monitors were applied.  A 6mm 22ga Stimuplex needle was used. Sterile prep and drape,hand hygiene and sterile gloves were used.  Negative aspiration and negative test dose prior to incremental administration of local anesthetic. The patient tolerated the procedure well.

## 2015-10-11 DIAGNOSIS — N186 End stage renal disease: Secondary | ICD-10-CM | POA: Diagnosis not present

## 2015-10-11 DIAGNOSIS — Z992 Dependence on renal dialysis: Secondary | ICD-10-CM | POA: Diagnosis not present

## 2015-10-13 ENCOUNTER — Ambulatory Visit (INDEPENDENT_AMBULATORY_CARE_PROVIDER_SITE_OTHER): Payer: Self-pay | Admitting: Vascular Surgery

## 2015-10-13 DIAGNOSIS — E162 Hypoglycemia, unspecified: Secondary | ICD-10-CM | POA: Diagnosis not present

## 2015-10-13 DIAGNOSIS — N2581 Secondary hyperparathyroidism of renal origin: Secondary | ICD-10-CM | POA: Diagnosis not present

## 2015-10-13 DIAGNOSIS — D509 Iron deficiency anemia, unspecified: Secondary | ICD-10-CM | POA: Diagnosis not present

## 2015-10-13 DIAGNOSIS — D631 Anemia in chronic kidney disease: Secondary | ICD-10-CM | POA: Diagnosis not present

## 2015-10-13 DIAGNOSIS — E11649 Type 2 diabetes mellitus with hypoglycemia without coma: Secondary | ICD-10-CM | POA: Diagnosis not present

## 2015-10-13 DIAGNOSIS — Z23 Encounter for immunization: Secondary | ICD-10-CM | POA: Diagnosis not present

## 2015-10-13 DIAGNOSIS — Z992 Dependence on renal dialysis: Secondary | ICD-10-CM | POA: Diagnosis not present

## 2015-10-13 DIAGNOSIS — N186 End stage renal disease: Secondary | ICD-10-CM | POA: Diagnosis not present

## 2015-10-15 DIAGNOSIS — D631 Anemia in chronic kidney disease: Secondary | ICD-10-CM | POA: Diagnosis not present

## 2015-10-15 DIAGNOSIS — N2581 Secondary hyperparathyroidism of renal origin: Secondary | ICD-10-CM | POA: Diagnosis not present

## 2015-10-15 DIAGNOSIS — E162 Hypoglycemia, unspecified: Secondary | ICD-10-CM | POA: Diagnosis not present

## 2015-10-15 DIAGNOSIS — Z23 Encounter for immunization: Secondary | ICD-10-CM | POA: Diagnosis not present

## 2015-10-15 DIAGNOSIS — N186 End stage renal disease: Secondary | ICD-10-CM | POA: Diagnosis not present

## 2015-10-15 DIAGNOSIS — D509 Iron deficiency anemia, unspecified: Secondary | ICD-10-CM | POA: Diagnosis not present

## 2015-10-17 DIAGNOSIS — D509 Iron deficiency anemia, unspecified: Secondary | ICD-10-CM | POA: Diagnosis not present

## 2015-10-17 DIAGNOSIS — N2581 Secondary hyperparathyroidism of renal origin: Secondary | ICD-10-CM | POA: Diagnosis not present

## 2015-10-17 DIAGNOSIS — N186 End stage renal disease: Secondary | ICD-10-CM | POA: Diagnosis not present

## 2015-10-17 DIAGNOSIS — D631 Anemia in chronic kidney disease: Secondary | ICD-10-CM | POA: Diagnosis not present

## 2015-10-17 DIAGNOSIS — E162 Hypoglycemia, unspecified: Secondary | ICD-10-CM | POA: Diagnosis not present

## 2015-10-17 DIAGNOSIS — Z23 Encounter for immunization: Secondary | ICD-10-CM | POA: Diagnosis not present

## 2015-10-20 DIAGNOSIS — D631 Anemia in chronic kidney disease: Secondary | ICD-10-CM | POA: Diagnosis not present

## 2015-10-20 DIAGNOSIS — Z992 Dependence on renal dialysis: Secondary | ICD-10-CM | POA: Diagnosis not present

## 2015-10-20 DIAGNOSIS — D509 Iron deficiency anemia, unspecified: Secondary | ICD-10-CM | POA: Diagnosis not present

## 2015-10-20 DIAGNOSIS — Z23 Encounter for immunization: Secondary | ICD-10-CM | POA: Diagnosis not present

## 2015-10-20 DIAGNOSIS — E162 Hypoglycemia, unspecified: Secondary | ICD-10-CM | POA: Diagnosis not present

## 2015-10-20 DIAGNOSIS — E119 Type 2 diabetes mellitus without complications: Secondary | ICD-10-CM | POA: Diagnosis not present

## 2015-10-20 DIAGNOSIS — N186 End stage renal disease: Secondary | ICD-10-CM | POA: Diagnosis not present

## 2015-10-20 DIAGNOSIS — N2581 Secondary hyperparathyroidism of renal origin: Secondary | ICD-10-CM | POA: Diagnosis not present

## 2015-10-22 DIAGNOSIS — N186 End stage renal disease: Secondary | ICD-10-CM | POA: Diagnosis not present

## 2015-10-22 DIAGNOSIS — D509 Iron deficiency anemia, unspecified: Secondary | ICD-10-CM | POA: Diagnosis not present

## 2015-10-22 DIAGNOSIS — E162 Hypoglycemia, unspecified: Secondary | ICD-10-CM | POA: Diagnosis not present

## 2015-10-22 DIAGNOSIS — Z23 Encounter for immunization: Secondary | ICD-10-CM | POA: Diagnosis not present

## 2015-10-22 DIAGNOSIS — N2581 Secondary hyperparathyroidism of renal origin: Secondary | ICD-10-CM | POA: Diagnosis not present

## 2015-10-22 DIAGNOSIS — D631 Anemia in chronic kidney disease: Secondary | ICD-10-CM | POA: Diagnosis not present

## 2015-10-24 DIAGNOSIS — Z23 Encounter for immunization: Secondary | ICD-10-CM | POA: Diagnosis not present

## 2015-10-24 DIAGNOSIS — D631 Anemia in chronic kidney disease: Secondary | ICD-10-CM | POA: Diagnosis not present

## 2015-10-24 DIAGNOSIS — E162 Hypoglycemia, unspecified: Secondary | ICD-10-CM | POA: Diagnosis not present

## 2015-10-24 DIAGNOSIS — D509 Iron deficiency anemia, unspecified: Secondary | ICD-10-CM | POA: Diagnosis not present

## 2015-10-24 DIAGNOSIS — N186 End stage renal disease: Secondary | ICD-10-CM | POA: Diagnosis not present

## 2015-10-24 DIAGNOSIS — N2581 Secondary hyperparathyroidism of renal origin: Secondary | ICD-10-CM | POA: Diagnosis not present

## 2015-10-27 DIAGNOSIS — N186 End stage renal disease: Secondary | ICD-10-CM | POA: Diagnosis not present

## 2015-10-27 DIAGNOSIS — D631 Anemia in chronic kidney disease: Secondary | ICD-10-CM | POA: Diagnosis not present

## 2015-10-27 DIAGNOSIS — D509 Iron deficiency anemia, unspecified: Secondary | ICD-10-CM | POA: Diagnosis not present

## 2015-10-27 DIAGNOSIS — Z23 Encounter for immunization: Secondary | ICD-10-CM | POA: Diagnosis not present

## 2015-10-27 DIAGNOSIS — E162 Hypoglycemia, unspecified: Secondary | ICD-10-CM | POA: Diagnosis not present

## 2015-10-27 DIAGNOSIS — N2581 Secondary hyperparathyroidism of renal origin: Secondary | ICD-10-CM | POA: Diagnosis not present

## 2015-10-29 DIAGNOSIS — Z23 Encounter for immunization: Secondary | ICD-10-CM | POA: Diagnosis not present

## 2015-10-29 DIAGNOSIS — N2581 Secondary hyperparathyroidism of renal origin: Secondary | ICD-10-CM | POA: Diagnosis not present

## 2015-10-29 DIAGNOSIS — D509 Iron deficiency anemia, unspecified: Secondary | ICD-10-CM | POA: Diagnosis not present

## 2015-10-29 DIAGNOSIS — N186 End stage renal disease: Secondary | ICD-10-CM | POA: Diagnosis not present

## 2015-10-29 DIAGNOSIS — D631 Anemia in chronic kidney disease: Secondary | ICD-10-CM | POA: Diagnosis not present

## 2015-10-29 DIAGNOSIS — E162 Hypoglycemia, unspecified: Secondary | ICD-10-CM | POA: Diagnosis not present

## 2015-10-31 ENCOUNTER — Ambulatory Visit: Payer: BLUE CROSS/BLUE SHIELD | Admitting: Family

## 2015-10-31 DIAGNOSIS — E162 Hypoglycemia, unspecified: Secondary | ICD-10-CM | POA: Diagnosis not present

## 2015-10-31 DIAGNOSIS — Z23 Encounter for immunization: Secondary | ICD-10-CM | POA: Diagnosis not present

## 2015-10-31 DIAGNOSIS — N186 End stage renal disease: Secondary | ICD-10-CM | POA: Diagnosis not present

## 2015-10-31 DIAGNOSIS — D631 Anemia in chronic kidney disease: Secondary | ICD-10-CM | POA: Diagnosis not present

## 2015-10-31 DIAGNOSIS — D509 Iron deficiency anemia, unspecified: Secondary | ICD-10-CM | POA: Diagnosis not present

## 2015-10-31 DIAGNOSIS — N2581 Secondary hyperparathyroidism of renal origin: Secondary | ICD-10-CM | POA: Diagnosis not present

## 2015-11-03 DIAGNOSIS — N186 End stage renal disease: Secondary | ICD-10-CM | POA: Diagnosis not present

## 2015-11-03 DIAGNOSIS — Z23 Encounter for immunization: Secondary | ICD-10-CM | POA: Diagnosis not present

## 2015-11-03 DIAGNOSIS — N2581 Secondary hyperparathyroidism of renal origin: Secondary | ICD-10-CM | POA: Diagnosis not present

## 2015-11-03 DIAGNOSIS — D509 Iron deficiency anemia, unspecified: Secondary | ICD-10-CM | POA: Diagnosis not present

## 2015-11-03 DIAGNOSIS — E162 Hypoglycemia, unspecified: Secondary | ICD-10-CM | POA: Diagnosis not present

## 2015-11-03 DIAGNOSIS — D631 Anemia in chronic kidney disease: Secondary | ICD-10-CM | POA: Diagnosis not present

## 2015-11-05 DIAGNOSIS — N2581 Secondary hyperparathyroidism of renal origin: Secondary | ICD-10-CM | POA: Diagnosis not present

## 2015-11-05 DIAGNOSIS — Z23 Encounter for immunization: Secondary | ICD-10-CM | POA: Diagnosis not present

## 2015-11-05 DIAGNOSIS — E162 Hypoglycemia, unspecified: Secondary | ICD-10-CM | POA: Diagnosis not present

## 2015-11-05 DIAGNOSIS — D631 Anemia in chronic kidney disease: Secondary | ICD-10-CM | POA: Diagnosis not present

## 2015-11-05 DIAGNOSIS — N186 End stage renal disease: Secondary | ICD-10-CM | POA: Diagnosis not present

## 2015-11-05 DIAGNOSIS — D509 Iron deficiency anemia, unspecified: Secondary | ICD-10-CM | POA: Diagnosis not present

## 2015-11-06 ENCOUNTER — Ambulatory Visit: Payer: Self-pay | Admitting: Family

## 2015-11-07 DIAGNOSIS — E162 Hypoglycemia, unspecified: Secondary | ICD-10-CM | POA: Diagnosis not present

## 2015-11-07 DIAGNOSIS — D631 Anemia in chronic kidney disease: Secondary | ICD-10-CM | POA: Diagnosis not present

## 2015-11-07 DIAGNOSIS — N186 End stage renal disease: Secondary | ICD-10-CM | POA: Diagnosis not present

## 2015-11-07 DIAGNOSIS — D509 Iron deficiency anemia, unspecified: Secondary | ICD-10-CM | POA: Diagnosis not present

## 2015-11-07 DIAGNOSIS — Z23 Encounter for immunization: Secondary | ICD-10-CM | POA: Diagnosis not present

## 2015-11-07 DIAGNOSIS — N2581 Secondary hyperparathyroidism of renal origin: Secondary | ICD-10-CM | POA: Diagnosis not present

## 2015-11-10 DIAGNOSIS — D631 Anemia in chronic kidney disease: Secondary | ICD-10-CM | POA: Diagnosis not present

## 2015-11-10 DIAGNOSIS — N2581 Secondary hyperparathyroidism of renal origin: Secondary | ICD-10-CM | POA: Diagnosis not present

## 2015-11-10 DIAGNOSIS — N186 End stage renal disease: Secondary | ICD-10-CM | POA: Diagnosis not present

## 2015-11-10 DIAGNOSIS — Z23 Encounter for immunization: Secondary | ICD-10-CM | POA: Diagnosis not present

## 2015-11-10 DIAGNOSIS — D509 Iron deficiency anemia, unspecified: Secondary | ICD-10-CM | POA: Diagnosis not present

## 2015-11-10 DIAGNOSIS — E162 Hypoglycemia, unspecified: Secondary | ICD-10-CM | POA: Diagnosis not present

## 2015-11-11 DIAGNOSIS — Z992 Dependence on renal dialysis: Secondary | ICD-10-CM | POA: Diagnosis not present

## 2015-11-11 DIAGNOSIS — N186 End stage renal disease: Secondary | ICD-10-CM | POA: Diagnosis not present

## 2015-11-12 ENCOUNTER — Other Ambulatory Visit (INDEPENDENT_AMBULATORY_CARE_PROVIDER_SITE_OTHER): Payer: Self-pay | Admitting: Vascular Surgery

## 2015-11-12 DIAGNOSIS — Z992 Dependence on renal dialysis: Secondary | ICD-10-CM | POA: Diagnosis not present

## 2015-11-12 DIAGNOSIS — D631 Anemia in chronic kidney disease: Secondary | ICD-10-CM | POA: Diagnosis not present

## 2015-11-12 DIAGNOSIS — Z23 Encounter for immunization: Secondary | ICD-10-CM | POA: Diagnosis not present

## 2015-11-12 DIAGNOSIS — D509 Iron deficiency anemia, unspecified: Secondary | ICD-10-CM | POA: Diagnosis not present

## 2015-11-12 DIAGNOSIS — T829XXA Unspecified complication of cardiac and vascular prosthetic device, implant and graft, initial encounter: Secondary | ICD-10-CM

## 2015-11-12 DIAGNOSIS — N2581 Secondary hyperparathyroidism of renal origin: Secondary | ICD-10-CM | POA: Diagnosis not present

## 2015-11-12 DIAGNOSIS — N186 End stage renal disease: Secondary | ICD-10-CM

## 2015-11-13 ENCOUNTER — Encounter (INDEPENDENT_AMBULATORY_CARE_PROVIDER_SITE_OTHER): Payer: Medicaid Other

## 2015-11-13 ENCOUNTER — Ambulatory Visit (INDEPENDENT_AMBULATORY_CARE_PROVIDER_SITE_OTHER): Payer: Self-pay | Admitting: Vascular Surgery

## 2015-11-14 DIAGNOSIS — D509 Iron deficiency anemia, unspecified: Secondary | ICD-10-CM | POA: Diagnosis not present

## 2015-11-14 DIAGNOSIS — Z23 Encounter for immunization: Secondary | ICD-10-CM | POA: Diagnosis not present

## 2015-11-14 DIAGNOSIS — Z992 Dependence on renal dialysis: Secondary | ICD-10-CM | POA: Diagnosis not present

## 2015-11-14 DIAGNOSIS — D631 Anemia in chronic kidney disease: Secondary | ICD-10-CM | POA: Diagnosis not present

## 2015-11-14 DIAGNOSIS — N186 End stage renal disease: Secondary | ICD-10-CM | POA: Diagnosis not present

## 2015-11-14 DIAGNOSIS — N2581 Secondary hyperparathyroidism of renal origin: Secondary | ICD-10-CM | POA: Diagnosis not present

## 2015-11-17 DIAGNOSIS — Z23 Encounter for immunization: Secondary | ICD-10-CM | POA: Diagnosis not present

## 2015-11-17 DIAGNOSIS — N186 End stage renal disease: Secondary | ICD-10-CM | POA: Diagnosis not present

## 2015-11-17 DIAGNOSIS — Z992 Dependence on renal dialysis: Secondary | ICD-10-CM | POA: Diagnosis not present

## 2015-11-17 DIAGNOSIS — N2581 Secondary hyperparathyroidism of renal origin: Secondary | ICD-10-CM | POA: Diagnosis not present

## 2015-11-17 DIAGNOSIS — D509 Iron deficiency anemia, unspecified: Secondary | ICD-10-CM | POA: Diagnosis not present

## 2015-11-17 DIAGNOSIS — D631 Anemia in chronic kidney disease: Secondary | ICD-10-CM | POA: Diagnosis not present

## 2015-11-19 DIAGNOSIS — Z23 Encounter for immunization: Secondary | ICD-10-CM | POA: Diagnosis not present

## 2015-11-19 DIAGNOSIS — Z992 Dependence on renal dialysis: Secondary | ICD-10-CM | POA: Diagnosis not present

## 2015-11-19 DIAGNOSIS — D631 Anemia in chronic kidney disease: Secondary | ICD-10-CM | POA: Diagnosis not present

## 2015-11-19 DIAGNOSIS — D509 Iron deficiency anemia, unspecified: Secondary | ICD-10-CM | POA: Diagnosis not present

## 2015-11-19 DIAGNOSIS — N186 End stage renal disease: Secondary | ICD-10-CM | POA: Diagnosis not present

## 2015-11-19 DIAGNOSIS — N2581 Secondary hyperparathyroidism of renal origin: Secondary | ICD-10-CM | POA: Diagnosis not present

## 2015-11-21 DIAGNOSIS — Z23 Encounter for immunization: Secondary | ICD-10-CM | POA: Diagnosis not present

## 2015-11-21 DIAGNOSIS — N2581 Secondary hyperparathyroidism of renal origin: Secondary | ICD-10-CM | POA: Diagnosis not present

## 2015-11-21 DIAGNOSIS — Z992 Dependence on renal dialysis: Secondary | ICD-10-CM | POA: Diagnosis not present

## 2015-11-21 DIAGNOSIS — D631 Anemia in chronic kidney disease: Secondary | ICD-10-CM | POA: Diagnosis not present

## 2015-11-21 DIAGNOSIS — D509 Iron deficiency anemia, unspecified: Secondary | ICD-10-CM | POA: Diagnosis not present

## 2015-11-21 DIAGNOSIS — N186 End stage renal disease: Secondary | ICD-10-CM | POA: Diagnosis not present

## 2015-11-24 DIAGNOSIS — Z992 Dependence on renal dialysis: Secondary | ICD-10-CM | POA: Diagnosis not present

## 2015-11-24 DIAGNOSIS — N186 End stage renal disease: Secondary | ICD-10-CM | POA: Diagnosis not present

## 2015-11-24 DIAGNOSIS — Z23 Encounter for immunization: Secondary | ICD-10-CM | POA: Diagnosis not present

## 2015-11-24 DIAGNOSIS — N2581 Secondary hyperparathyroidism of renal origin: Secondary | ICD-10-CM | POA: Diagnosis not present

## 2015-11-24 DIAGNOSIS — D631 Anemia in chronic kidney disease: Secondary | ICD-10-CM | POA: Diagnosis not present

## 2015-11-24 DIAGNOSIS — D509 Iron deficiency anemia, unspecified: Secondary | ICD-10-CM | POA: Diagnosis not present

## 2015-11-26 DIAGNOSIS — D631 Anemia in chronic kidney disease: Secondary | ICD-10-CM | POA: Diagnosis not present

## 2015-11-26 DIAGNOSIS — Z23 Encounter for immunization: Secondary | ICD-10-CM | POA: Diagnosis not present

## 2015-11-26 DIAGNOSIS — N186 End stage renal disease: Secondary | ICD-10-CM | POA: Diagnosis not present

## 2015-11-26 DIAGNOSIS — N2581 Secondary hyperparathyroidism of renal origin: Secondary | ICD-10-CM | POA: Diagnosis not present

## 2015-11-26 DIAGNOSIS — Z992 Dependence on renal dialysis: Secondary | ICD-10-CM | POA: Diagnosis not present

## 2015-11-26 DIAGNOSIS — D509 Iron deficiency anemia, unspecified: Secondary | ICD-10-CM | POA: Diagnosis not present

## 2015-11-28 DIAGNOSIS — Z23 Encounter for immunization: Secondary | ICD-10-CM | POA: Diagnosis not present

## 2015-11-28 DIAGNOSIS — D509 Iron deficiency anemia, unspecified: Secondary | ICD-10-CM | POA: Diagnosis not present

## 2015-11-28 DIAGNOSIS — N2581 Secondary hyperparathyroidism of renal origin: Secondary | ICD-10-CM | POA: Diagnosis not present

## 2015-11-28 DIAGNOSIS — D631 Anemia in chronic kidney disease: Secondary | ICD-10-CM | POA: Diagnosis not present

## 2015-11-28 DIAGNOSIS — N186 End stage renal disease: Secondary | ICD-10-CM | POA: Diagnosis not present

## 2015-11-28 DIAGNOSIS — Z992 Dependence on renal dialysis: Secondary | ICD-10-CM | POA: Diagnosis not present

## 2015-11-30 DIAGNOSIS — D509 Iron deficiency anemia, unspecified: Secondary | ICD-10-CM | POA: Diagnosis not present

## 2015-11-30 DIAGNOSIS — N2581 Secondary hyperparathyroidism of renal origin: Secondary | ICD-10-CM | POA: Diagnosis not present

## 2015-11-30 DIAGNOSIS — D631 Anemia in chronic kidney disease: Secondary | ICD-10-CM | POA: Diagnosis not present

## 2015-11-30 DIAGNOSIS — Z992 Dependence on renal dialysis: Secondary | ICD-10-CM | POA: Diagnosis not present

## 2015-11-30 DIAGNOSIS — N186 End stage renal disease: Secondary | ICD-10-CM | POA: Diagnosis not present

## 2015-11-30 DIAGNOSIS — Z23 Encounter for immunization: Secondary | ICD-10-CM | POA: Diagnosis not present

## 2015-12-01 ENCOUNTER — Encounter (INDEPENDENT_AMBULATORY_CARE_PROVIDER_SITE_OTHER): Payer: Self-pay

## 2015-12-02 DIAGNOSIS — N186 End stage renal disease: Secondary | ICD-10-CM | POA: Diagnosis not present

## 2015-12-02 DIAGNOSIS — D509 Iron deficiency anemia, unspecified: Secondary | ICD-10-CM | POA: Diagnosis not present

## 2015-12-02 DIAGNOSIS — Z992 Dependence on renal dialysis: Secondary | ICD-10-CM | POA: Diagnosis not present

## 2015-12-02 DIAGNOSIS — D631 Anemia in chronic kidney disease: Secondary | ICD-10-CM | POA: Diagnosis not present

## 2015-12-02 DIAGNOSIS — N2581 Secondary hyperparathyroidism of renal origin: Secondary | ICD-10-CM | POA: Diagnosis not present

## 2015-12-02 DIAGNOSIS — Z23 Encounter for immunization: Secondary | ICD-10-CM | POA: Diagnosis not present

## 2015-12-05 DIAGNOSIS — Z23 Encounter for immunization: Secondary | ICD-10-CM | POA: Diagnosis not present

## 2015-12-05 DIAGNOSIS — N2581 Secondary hyperparathyroidism of renal origin: Secondary | ICD-10-CM | POA: Diagnosis not present

## 2015-12-05 DIAGNOSIS — Z992 Dependence on renal dialysis: Secondary | ICD-10-CM | POA: Diagnosis not present

## 2015-12-05 DIAGNOSIS — D631 Anemia in chronic kidney disease: Secondary | ICD-10-CM | POA: Diagnosis not present

## 2015-12-05 DIAGNOSIS — N186 End stage renal disease: Secondary | ICD-10-CM | POA: Diagnosis not present

## 2015-12-05 DIAGNOSIS — D509 Iron deficiency anemia, unspecified: Secondary | ICD-10-CM | POA: Diagnosis not present

## 2015-12-08 ENCOUNTER — Emergency Department
Admission: EM | Admit: 2015-12-08 | Discharge: 2015-12-08 | Disposition: A | Discharge status: Home / Self Care | Payer: Medicare Other | Attending: Emergency Medicine | Admitting: Emergency Medicine

## 2015-12-08 ENCOUNTER — Encounter: Payer: Self-pay | Admitting: Emergency Medicine

## 2015-12-08 DIAGNOSIS — Z7984 Long term (current) use of oral hypoglycemic drugs: Secondary | ICD-10-CM | POA: Diagnosis not present

## 2015-12-08 DIAGNOSIS — Z87891 Personal history of nicotine dependence: Secondary | ICD-10-CM | POA: Diagnosis not present

## 2015-12-08 DIAGNOSIS — Z79899 Other long term (current) drug therapy: Secondary | ICD-10-CM | POA: Diagnosis not present

## 2015-12-08 DIAGNOSIS — R42 Dizziness and giddiness: Secondary | ICD-10-CM

## 2015-12-08 DIAGNOSIS — N183 Chronic kidney disease, stage 3 (moderate): Secondary | ICD-10-CM | POA: Diagnosis not present

## 2015-12-08 DIAGNOSIS — Z7982 Long term (current) use of aspirin: Secondary | ICD-10-CM | POA: Insufficient documentation

## 2015-12-08 DIAGNOSIS — I130 Hypertensive heart and chronic kidney disease with heart failure and stage 1 through stage 4 chronic kidney disease, or unspecified chronic kidney disease: Secondary | ICD-10-CM | POA: Insufficient documentation

## 2015-12-08 DIAGNOSIS — E1122 Type 2 diabetes mellitus with diabetic chronic kidney disease: Secondary | ICD-10-CM | POA: Diagnosis not present

## 2015-12-08 DIAGNOSIS — K625 Hemorrhage of anus and rectum: Secondary | ICD-10-CM | POA: Insufficient documentation

## 2015-12-08 DIAGNOSIS — R197 Diarrhea, unspecified: Secondary | ICD-10-CM | POA: Diagnosis not present

## 2015-12-08 DIAGNOSIS — J449 Chronic obstructive pulmonary disease, unspecified: Secondary | ICD-10-CM | POA: Diagnosis not present

## 2015-12-08 DIAGNOSIS — I5032 Chronic diastolic (congestive) heart failure: Secondary | ICD-10-CM | POA: Insufficient documentation

## 2015-12-08 LAB — URINALYSIS COMPLETE WITH MICROSCOPIC (ARMC ONLY)
BACTERIA UA: NONE SEEN
BILIRUBIN URINE: NEGATIVE
Glucose, UA: NEGATIVE mg/dL
HGB URINE DIPSTICK: NEGATIVE
KETONES UR: NEGATIVE mg/dL
LEUKOCYTES UA: NEGATIVE
NITRITE: NEGATIVE
PH: 5 (ref 5.0–8.0)
Specific Gravity, Urine: 1.017 (ref 1.005–1.030)

## 2015-12-08 LAB — COMPREHENSIVE METABOLIC PANEL
ALBUMIN: 3.3 g/dL — AB (ref 3.5–5.0)
ALT: 11 U/L — ABNORMAL LOW (ref 14–54)
ANION GAP: 13 (ref 5–15)
AST: 11 U/L — AB (ref 15–41)
Alkaline Phosphatase: 45 U/L (ref 38–126)
BILIRUBIN TOTAL: 0.6 mg/dL (ref 0.3–1.2)
BUN: 81 mg/dL — ABNORMAL HIGH (ref 6–20)
CHLORIDE: 99 mmol/L — AB (ref 101–111)
CO2: 24 mmol/L (ref 22–32)
Calcium: 8.5 mg/dL — ABNORMAL LOW (ref 8.9–10.3)
Creatinine, Ser: 9.28 mg/dL — ABNORMAL HIGH (ref 0.44–1.00)
GFR calc Af Amer: 5 mL/min — ABNORMAL LOW (ref >60)
GFR, EST NON AFRICAN AMERICAN: 4 mL/min — AB (ref >60)
Glucose, Bld: 56 mg/dL — ABNORMAL LOW (ref 65–99)
POTASSIUM: 4.9 mmol/L (ref 3.5–5.1)
Sodium: 136 mmol/L (ref 135–145)
TOTAL PROTEIN: 6.9 g/dL (ref 6.5–8.1)

## 2015-12-08 LAB — CBC
HEMATOCRIT: 35.3 % (ref 35.0–47.0)
Hemoglobin: 11.6 g/dL — ABNORMAL LOW (ref 12.0–16.0)
MCH: 28.8 pg (ref 26.0–34.0)
MCHC: 32.9 g/dL (ref 32.0–36.0)
MCV: 87.6 fL (ref 80.0–100.0)
Platelets: 211 10*3/uL (ref 150–440)
RBC: 4.03 MIL/uL (ref 3.80–5.20)
RDW: 17 % — AB (ref 11.5–14.5)
WBC: 10.3 10*3/uL (ref 3.6–11.0)

## 2015-12-08 LAB — TROPONIN I

## 2015-12-08 MED ORDER — MECLIZINE HCL 25 MG PO TABS
25.0000 mg | ORAL_TABLET | Freq: Once | ORAL | Status: AC
  Administered 2015-12-08: 25 mg via ORAL
  Filled 2015-12-08: qty 1

## 2015-12-08 MED ORDER — MECLIZINE HCL 25 MG PO TABS: 25.0000 mg | ORAL_TABLET | Freq: Three times a day (TID) | ORAL | 0 refills | Status: AC | PRN

## 2015-12-08 NOTE — ED Triage Notes (Signed)
States had rectal bleeding 2 days ago. States had syncopal at that time. States had had constipation with use of laxatives and results throughout the week. States passing mucous and blood.

## 2015-12-08 NOTE — Discharge Instructions (Signed)
Please seek medical attention for any high fevers, chest pain, shortness of breath, change in behavior, persistent vomiting, bloody stool or any other new or concerning symptoms.  

## 2015-12-08 NOTE — ED Notes (Signed)
Pt reports that she was constipated for a week and then that resolved Friday (states it felt like a baseball) - since then she has been having abd pain and getting dizzy - reports "passing out" Saturday - pt reports that when she has a BM it is loose and has mucus with bright red blood in it - states that she is unable to lay flat because it makes her dizzy

## 2015-12-08 NOTE — ED Provider Notes (Signed)
[facility] Emergency Department Provider Note  ____________________________________________   I have reviewed the triage vital signs and the nursing notes.   HISTORY  Chief Complaint Rectal Bleeding and Dizziness   History limited by: Not Limited   HPI Jacqueline Rodriguez is a 55 y.o. female who presents to the emergency department today because of concern for rectal bleeding and dizziness. The patient states that the rectal bleeding and is been going on for a few days. It is bright red. She states that she was having some constipation and took some laxatives last week. 3 days ago she states she passed a large hard stool. She states it was quite painful.Since then with her bowel movements she will have some pain on sitting afterwards. In addition to this patient is also been feeling more dizzy recently. She describes it as a sensation of the room spinning around her. Seems to be worse when she changes positions. She states that then eases off. She denies any chest pain or palpitations.    Past Medical History:  Diagnosis Date  . Anemia   . Chronic bronchitis ([place])   . Chronic diastolic CHF (congestive heart failure) ([place])   . CKD (chronic kidney disease), stage III   . COPD (chronic obstructive pulmonary disease) ([place])   . Diabetes mellitus with renal complications ([place])   . Essential hypertension   . Obesity   . Pulmonary hypertension   . Renal insufficiency 09/19/2015   Stage 3 CKD. Beginning dialysis.    Patient Active Problem List   Diagnosis Date Noted  . Chronic diastolic heart failure ([place]) 07/30/2015  . Acute renal failure superimposed on stage 3 chronic kidney disease ([place]) 07/18/2015  . Diabetes mellitus with renal complications ([place])   . CKD (chronic kidney disease), stage III   . Pulmonary hypertension   . Obesity   . Morbid obesity due to excess calories ([place])   . Anemia in chronic renal disease   . Shortness of breath   . Back pain  05/01/2015  . Essential hypertension 03/25/2015  . Diabetes ([place]) 03/25/2015    Past Surgical History:  Procedure Laterality Date  . AV FISTULA PLACEMENT Left 10/01/2015   Procedure: ARTERIOVENOUS (AV) FISTULA CREATION ( BRACHIAL CEPHALIC );  Surgeon: Algernon Huxley, MD;  Location: [facility] ORS;  Service: Vascular;  Laterality: Left;  . PERIPHERAL VASCULAR CATHETERIZATION N/A 07/21/2015   Procedure: Dialysis/Perma Catheter;  Surgeon: Algernon Huxley, MD;  Location: [facility];  Service: Cardiovascular;  Laterality: N/A;    Prior to Admission medications   Medication Sig Start Date End Date Taking? Authorizing Provider  acetaminophen (TYLENOL) 500 MG tablet Take 500 mg by mouth every 6 (six) hours as needed.    Historical Provider, MD  albuterol (PROVENTIL HFA;VENTOLIN HFA) 108 (90 Base) MCG/ACT inhaler Inhale 2 puffs into the lungs every 6 (six) hours as needed for wheezing or shortness of breath. 01/08/15   Gregor Hams, MD  amLODipine (NORVASC) 5 MG tablet Take 1 tablet (5 mg total) by mouth daily. 07/24/15   Vaughan Basta, MD  aspirin EC 81 MG EC tablet Take 1 tablet (81 mg total) by mouth daily. 03/20/15   Vaughan Basta, MD  Calcium-Magnesium-Vitamin D (CALCIUM 1200+D3 PO) Take 1 tablet by mouth daily.    Historical Provider, MD  carvedilol (COREG) 6.25 MG tablet Take 6.25 mg by mouth 2 (two) times daily with a meal.    Historical Provider, MD  cetirizine (ZYRTEC) 10 MG tablet Take  10 mg by mouth daily as needed for allergies.    Historical Provider, MD  ferrous sulfate 325 (65 FE) MG tablet Take 325 mg by mouth 2 (two) times daily.    Historical Provider, MD  gabapentin (NEURONTIN) 100 MG capsule Take 100 mg by mouth 3 (three) times daily as needed. For neuropathy. 03/05/15   Historical Provider, MD  glipiZIDE (GLUCOTROL) 5 MG tablet Take 5 mg by mouth daily before breakfast.     Historical Provider, MD  GLUCOSAMINE-CHONDROITIN PO Take 4,000 mg by mouth daily.    Historical  Provider, MD  hydrALAZINE (APRESOLINE) 25 MG tablet Take 25 mg by mouth 3 (three) times daily.    Historical Provider, MD  HYDROcodone-acetaminophen (NORCO) 5-325 MG tablet Take 1 tablet by mouth every 6 (six) hours as needed for moderate pain. 10/01/15   Algernon Huxley, MD  isosorbide mononitrate (IMDUR) 30 MG 24 hr tablet Take 30 mg by mouth daily.    Historical Provider, MD  lisinopril (PRINIVIL,ZESTRIL) 10 MG tablet Take 10 mg by mouth every other day.     Historical Provider, MD  Magnesium Oxide 250 MG TABS Take 1 tablet by mouth daily.    Historical Provider, MD    Allergies Ambien [zolpidem]  Family History  Problem Relation Age of Onset  . Hypertension Mother   . Diabetes Mellitus II Mother   . CAD Father   . Hypertension Father   . Heart attack Father     Social History Social History  Substance Use Topics  . Smoking status: Former Smoker    Types: Cigarettes    Quit date: 03/24/1985  . Smokeless tobacco: Never Used  . Alcohol use No    Review of Systems  Constitutional: Negative for fever. Cardiovascular: Negative for chest pain. Respiratory: Negative for shortness of breath. Gastrointestinal: Negative for abdominal pain, vomiting and diarrhea. Positive for rectal bleeding. Genitourinary: Negative for dysuria. Musculoskeletal: Negative for back pain. Skin: Negative for rash. Neurological: Positive for dizziness.  10-point ROS otherwise negative.  ____________________________________________   PHYSICAL EXAM:  VITAL SIGNS: ED Triage Vitals  Enc Vitals Group     BP 12/08/15 1313 (!) 143/59     Pulse Rate 12/08/15 1313 73     Resp 12/08/15 1313 16     Temp 12/08/15 1313 98.1 F (36.7 C)     Temp Source 12/08/15 1313 Oral     SpO2 12/08/15 1313 98 %     Weight 12/08/15 1314 200 lb (90.7 kg)     Height 12/08/15 1314 5\' 9"  (1.753 m)     Head Circumference --      Peak Flow --      Pain Score 12/08/15 1344 0   Constitutional: Alert and oriented. Well  appearing and in no distress. Eyes: Conjunctivae are normal. Normal extraocular movements. ENT   Head: Normocephalic and atraumatic.   Nose: No congestion/rhinnorhea.   Mouth/Throat: Mucous membranes are moist.   Neck: No stridor. Hematological/Lymphatic/Immunilogical: No cervical lymphadenopathy. Cardiovascular: Normal rate, regular rhythm.  No murmurs, rubs, or gallops. Respiratory: Normal respiratory effort without tachypnea nor retractions. Breath sounds are clear and equal bilaterally. No wheezes/rales/rhonchi. Gastrointestinal: Soft and nontender. No distention.  Genitourinary: Deferred Musculoskeletal: Normal range of motion in all extremities. No lower extremity edema. Neurologic:  Normal speech and language. No gross focal neurologic deficits are appreciated.  Skin:  Skin is warm, dry and intact. No rash noted. Psychiatric: Mood and affect are normal. Speech and behavior are normal. Patient exhibits appropriate  insight and judgment.  ____________________________________________    LABS (pertinent positives/negatives)  Labs Reviewed  CBC - Abnormal; Notable for the following:       Result Value   Hemoglobin 11.6 (*)    RDW 17.0 (*)    All other components within normal limits  URINALYSIS COMPLETEWITH MICROSCOPIC ([facility] ONLY) - Abnormal; Notable for the following:    Color, Urine YELLOW (*)    APPearance CLOUDY (*)    Protein, ur >500 (*)    Squamous Epithelial / LPF 6-30 (*)    All other components within normal limits  COMPREHENSIVE METABOLIC PANEL - Abnormal; Notable for the following:    Chloride 99 (*)    Glucose, Bld 56 (*)    BUN 81 (*)    Creatinine, Ser 9.28 (*)    Calcium 8.5 (*)    Albumin 3.3 (*)    AST 11 (*)    ALT 11 (*)    GFR calc non Af Amer 4 (*)    GFR calc Af Amer 5 (*)    All other components within normal limits  TROPONIN I  CBG MONITORING, ED     ____________________________________________   EKG  I, Nance Pear,  attending physician, personally viewed and interpreted this EKG  EKG Time: 1357 Rate: 62 Rhythm: normal sinus rhythm Axis: left axis deviation Intervals: qtc 448 QRS: LAFB ST changes: no st elevation Impression: abnormal ekg   ____________________________________________    RADIOLOGY  None  ____________________________________________   PROCEDURES  Procedures  ____________________________________________   INITIAL IMPRESSION / ASSESSMENT AND PLAN / ED COURSE  Pertinent labs & imaging results that were available during my care of the patient were reviewed by me and considered in my medical decision making (see chart for details).  Patient presents to the emergency department today because of concerns for dizziness. Clinical history is consistent with vertigo. On exam when I did sit the patient up she became quite dizzy. There was some nystagmus noted at that time. This dizziness did abate. At this point I doubt central lesion given intermittent and positional nature of the dizziness. Will try and Antivert and reassess.  Clinical Course   Patient states that she feels much better after the Antivert. She was able to get up and go to the bathroom which muscles dizziness. At this point I doubt central lesion. Blood work was notable for elevated creatinine. Potassium however was within normal limits. Discussed with nephrology and call who states patient can follow up with dialysis center tomorrow to get a session. Furthermore patient is complaining of further diarrhea here. Did discuss with patient that she'll try many. Did offer to test patient for C. difficile hour she chose to leave the emergency department prior to that has been able to be run. ____________________________________________   FINAL CLINICAL IMPRESSION(S) / ED DIAGNOSES  Final diagnoses:  Dizziness  Diarrhea, unspecified type     Note: This dictation was prepared with Dragon dictation. Any transcriptional  errors that result from this process are unintentional    Nance Pear, MD 12/08/15 1645

## 2015-12-08 NOTE — ED Triage Notes (Signed)
Dizzy x 2 days

## 2015-12-09 ENCOUNTER — Other Ambulatory Visit (INDEPENDENT_AMBULATORY_CARE_PROVIDER_SITE_OTHER): Payer: Self-pay | Admitting: Vascular Surgery

## 2015-12-09 DIAGNOSIS — N186 End stage renal disease: Secondary | ICD-10-CM | POA: Diagnosis not present

## 2015-12-09 DIAGNOSIS — N2581 Secondary hyperparathyroidism of renal origin: Secondary | ICD-10-CM | POA: Diagnosis not present

## 2015-12-09 DIAGNOSIS — D509 Iron deficiency anemia, unspecified: Secondary | ICD-10-CM | POA: Diagnosis not present

## 2015-12-09 DIAGNOSIS — Z992 Dependence on renal dialysis: Secondary | ICD-10-CM | POA: Diagnosis not present

## 2015-12-09 DIAGNOSIS — D631 Anemia in chronic kidney disease: Secondary | ICD-10-CM | POA: Diagnosis not present

## 2015-12-09 DIAGNOSIS — Z23 Encounter for immunization: Secondary | ICD-10-CM | POA: Diagnosis not present

## 2015-12-10 DIAGNOSIS — D509 Iron deficiency anemia, unspecified: Secondary | ICD-10-CM | POA: Diagnosis not present

## 2015-12-10 DIAGNOSIS — D631 Anemia in chronic kidney disease: Secondary | ICD-10-CM | POA: Diagnosis not present

## 2015-12-10 DIAGNOSIS — N186 End stage renal disease: Secondary | ICD-10-CM | POA: Diagnosis not present

## 2015-12-10 DIAGNOSIS — N2581 Secondary hyperparathyroidism of renal origin: Secondary | ICD-10-CM | POA: Diagnosis not present

## 2015-12-10 DIAGNOSIS — Z992 Dependence on renal dialysis: Secondary | ICD-10-CM | POA: Diagnosis not present

## 2015-12-10 DIAGNOSIS — Z23 Encounter for immunization: Secondary | ICD-10-CM | POA: Diagnosis not present

## 2015-12-11 ENCOUNTER — Ambulatory Visit
Admission: RE | Admit: 2015-12-11 | Discharge: 2015-12-11 | Disposition: A | Discharge status: Home / Self Care | Payer: Medicare Other | Source: Ambulatory Visit | Attending: Vascular Surgery | Admitting: Vascular Surgery

## 2015-12-11 ENCOUNTER — Encounter
Admission: RE | Disposition: A | Discharge status: Home / Self Care | Payer: Self-pay | Source: Ambulatory Visit | Attending: Vascular Surgery

## 2015-12-11 DIAGNOSIS — Z8249 Family history of ischemic heart disease and other diseases of the circulatory system: Secondary | ICD-10-CM | POA: Diagnosis not present

## 2015-12-11 DIAGNOSIS — I272 Pulmonary hypertension, unspecified: Secondary | ICD-10-CM | POA: Diagnosis not present

## 2015-12-11 DIAGNOSIS — E1122 Type 2 diabetes mellitus with diabetic chronic kidney disease: Secondary | ICD-10-CM | POA: Diagnosis not present

## 2015-12-11 DIAGNOSIS — Z992 Dependence on renal dialysis: Secondary | ICD-10-CM | POA: Diagnosis not present

## 2015-12-11 DIAGNOSIS — Z87891 Personal history of nicotine dependence: Secondary | ICD-10-CM | POA: Insufficient documentation

## 2015-12-11 DIAGNOSIS — Z7982 Long term (current) use of aspirin: Secondary | ICD-10-CM | POA: Insufficient documentation

## 2015-12-11 DIAGNOSIS — N186 End stage renal disease: Secondary | ICD-10-CM | POA: Diagnosis not present

## 2015-12-11 DIAGNOSIS — Z452 Encounter for adjustment and management of vascular access device: Secondary | ICD-10-CM | POA: Insufficient documentation

## 2015-12-11 DIAGNOSIS — Z833 Family history of diabetes mellitus: Secondary | ICD-10-CM | POA: Diagnosis not present

## 2015-12-11 DIAGNOSIS — Z888 Allergy status to other drugs, medicaments and biological substances status: Secondary | ICD-10-CM | POA: Diagnosis not present

## 2015-12-11 DIAGNOSIS — E669 Obesity, unspecified: Secondary | ICD-10-CM | POA: Insufficient documentation

## 2015-12-11 DIAGNOSIS — D649 Anemia, unspecified: Secondary | ICD-10-CM | POA: Diagnosis not present

## 2015-12-11 DIAGNOSIS — J449 Chronic obstructive pulmonary disease, unspecified: Secondary | ICD-10-CM | POA: Diagnosis not present

## 2015-12-11 DIAGNOSIS — I5032 Chronic diastolic (congestive) heart failure: Secondary | ICD-10-CM | POA: Insufficient documentation

## 2015-12-11 DIAGNOSIS — I132 Hypertensive heart and chronic kidney disease with heart failure and with stage 5 chronic kidney disease, or end stage renal disease: Secondary | ICD-10-CM | POA: Diagnosis not present

## 2015-12-11 HISTORY — PX: PERIPHERAL VASCULAR CATHETERIZATION: SHX172C

## 2015-12-11 SURGERY — DIALYSIS/PERMA CATHETER REMOVAL
Anesthesia: Moderate Sedation

## 2015-12-11 MED ORDER — LIDOCAINE-EPINEPHRINE 1 %-1:100000 IJ SOLN
INTRAMUSCULAR | Status: AC
  Filled 2015-12-11: qty 1

## 2015-12-11 MED ORDER — LIDOCAINE-EPINEPHRINE (PF) 1 %-1:200000 IJ SOLN
INTRAMUSCULAR | Status: DC | PRN
  Administered 2015-12-11: 10 mL

## 2015-12-11 SURGICAL SUPPLY — 2 items
FORCEPS HALSTEAD CVD 5IN STRL (INSTRUMENTS) ×2 IMPLANT
TRAY LACERAT/PLASTIC (MISCELLANEOUS) ×2 IMPLANT

## 2015-12-11 NOTE — Discharge Instructions (Signed)
Change dressing tomorrow, may leave off if no drainage noted, may shower after drainage stops

## 2015-12-11 NOTE — H&P (Signed)
[facility] History & Physical Update  The patient was interviewed and re-examined.  The patient's previous History and Physical has been reviewed and is unchanged.  There is no change in the plan of care. We plan to proceed with the scheduled procedure.  Leotis Pain, MD  12/11/2015, 10:44 AM

## 2015-12-11 NOTE — Op Note (Signed)
Operative Note     Preoperative diagnosis:   1. ESRD with functional permanent access  Postoperative diagnosis:  1. ESRD with functional permanent access  Procedure:  Removal of right jugular Permcath  Surgeon:  Leotis Pain, MD  Anesthesia:  Local  EBL:  Minimal  Indication for the Procedure:  The patient has a functional permanent dialysis access and no longer needs their permcath.  This can be removed.  Risks and benefits are discussed and informed consent is obtained.  Description of the Procedure:  The patient's right neck, chest and existing catheter were sterilely prepped and draped. The area around the catheter was anesthetized copiously with 1% lidocaine. The catheter was dissected out with curved hemostats until the cuff was freed from the surrounding fibrous sheath. The fiber sheath was transected, and the catheter was then removed in its entirety using gentle traction. Pressure was held and sterile dressings were placed. The patient tolerated the procedure well and was taken to the recovery room in stable condition.     Leotis Pain  12/11/2015, 12:39 PM This note was created with Dragon Medical transcription system. Any errors in dictation are purely unintentional.

## 2015-12-12 ENCOUNTER — Encounter: Payer: Self-pay | Admitting: Vascular Surgery

## 2015-12-12 DIAGNOSIS — N186 End stage renal disease: Secondary | ICD-10-CM | POA: Diagnosis not present

## 2015-12-12 DIAGNOSIS — Z992 Dependence on renal dialysis: Secondary | ICD-10-CM | POA: Diagnosis not present

## 2015-12-12 DIAGNOSIS — D631 Anemia in chronic kidney disease: Secondary | ICD-10-CM | POA: Diagnosis not present

## 2015-12-12 DIAGNOSIS — N2581 Secondary hyperparathyroidism of renal origin: Secondary | ICD-10-CM | POA: Diagnosis not present

## 2015-12-15 DIAGNOSIS — N186 End stage renal disease: Secondary | ICD-10-CM | POA: Diagnosis not present

## 2015-12-15 DIAGNOSIS — D631 Anemia in chronic kidney disease: Secondary | ICD-10-CM | POA: Diagnosis not present

## 2015-12-15 DIAGNOSIS — Z992 Dependence on renal dialysis: Secondary | ICD-10-CM | POA: Diagnosis not present

## 2015-12-15 DIAGNOSIS — N2581 Secondary hyperparathyroidism of renal origin: Secondary | ICD-10-CM | POA: Diagnosis not present

## 2015-12-15 NOTE — H&P (Signed)
[facility] Admission History & Physical  MRN : [id number]  Jacqueline Rodriguez is a 55 y.o. ([birth year]) female who presents with chief complaint of No chief complaint on file. Jacqueline Rodriguez  History of Present Illness: Patient percent for removal of her dialysis access catheter. She has a permanent dialysis access which is now currently functional. She no longer needs her catheter. She has no other complaints today  No current facility-administered medications for this encounter.    Current Outpatient Prescriptions  Medication Sig Dispense Refill  . acetaminophen (TYLENOL) 500 MG tablet Take 500 mg by mouth every 6 (six) hours as needed.    Jacqueline Rodriguez albuterol (PROVENTIL HFA;VENTOLIN HFA) 108 (90 Base) MCG/ACT inhaler Inhale 2 puffs into the lungs every 6 (six) hours as needed for wheezing or shortness of breath. 1 Inhaler 2  . amLODipine (NORVASC) 5 MG tablet Take 1 tablet (5 mg total) by mouth daily. 30 tablet 0  . aspirin EC 81 MG EC tablet Take 1 tablet (81 mg total) by mouth daily. 30 tablet 0  . Calcium-Magnesium-Vitamin D (CALCIUM 1200+D3 PO) Take 1 tablet by mouth daily.    . carvedilol (COREG) 6.25 MG tablet Take 6.25 mg by mouth 2 (two) times daily with a meal.    . cetirizine (ZYRTEC) 10 MG tablet Take 10 mg by mouth daily as needed for allergies.    . ferrous sulfate 325 (65 FE) MG tablet Take 325 mg by mouth 2 (two) times daily.    Jacqueline Rodriguez gabapentin (NEURONTIN) 100 MG capsule Take 100 mg by mouth 3 (three) times daily as needed. For neuropathy.  2  . glipiZIDE (GLUCOTROL) 5 MG tablet Take 5 mg by mouth daily before breakfast.     . GLUCOSAMINE-CHONDROITIN PO Take 4,000 mg by mouth daily.    . hydrALAZINE (APRESOLINE) 25 MG tablet Take 25 mg by mouth 3 (three) times daily.    Jacqueline Rodriguez HYDROcodone-acetaminophen (NORCO) 5-325 MG tablet Take 1 tablet by mouth every 6 (six) hours as needed for moderate pain. 30 tablet 0  . isosorbide mononitrate (IMDUR) 30 MG 24 hr tablet Take 30 mg by mouth  daily.    Jacqueline Rodriguez lisinopril (PRINIVIL,ZESTRIL) 10 MG tablet Take 10 mg by mouth every other day.     . Magnesium Oxide 250 MG TABS Take 1 tablet by mouth daily.    . meclizine (ANTIVERT) 25 MG tablet Take 1 tablet (25 mg total) by mouth 3 (three) times daily as needed for dizziness. 30 tablet 0    Past Medical History:  Diagnosis Date  . Anemia   . Chronic bronchitis ([place])   . Chronic diastolic CHF (congestive heart failure) ([place])   . CKD (chronic kidney disease), stage III   . COPD (chronic obstructive pulmonary disease) ([place])   . Diabetes mellitus with renal complications ([place])   . Essential hypertension   . Obesity   . Pulmonary hypertension   . Renal insufficiency 09/19/2015   Stage 3 CKD. Beginning dialysis.    Past Surgical History:  Procedure Laterality Date  . AV FISTULA PLACEMENT Left 10/01/2015   Procedure: ARTERIOVENOUS (AV) FISTULA CREATION ( BRACHIAL CEPHALIC );  Surgeon: Algernon Huxley, MD;  Location: [facility];  Service: Vascular;  Laterality: Left;  . PERIPHERAL VASCULAR CATHETERIZATION N/A 07/21/2015   Procedure: Dialysis/Perma Catheter;  Surgeon: Algernon Huxley, MD;  Location: [place] [facility];  Service: Cardiovascular;  Laterality: N/A;  . PERIPHERAL VASCULAR CATHETERIZATION N/A 12/11/2015   Procedure: Dialysis/Perma Catheter Removal;  Surgeon:  Algernon Huxley, MD;  Location: [place] [facility];  Service: Cardiovascular;  Laterality: N/A;    Social History Social History  Substance Use Topics  . Smoking status: Former Smoker    Types: Cigarettes    Quit date: 03/24/1985  . Smokeless tobacco: Never Used  . Alcohol use No    Family History Family History  Problem Relation Age of Onset  . Hypertension Mother   . Diabetes Mellitus II Mother   . CAD Father   . Hypertension Father   . Heart attack Father     Allergies  Allergen Reactions  . Ambien [Zolpidem] Other (See Comments)    hallucinations     REVIEW OF SYSTEMS (Negative unless  checked)  Constitutional: [] Weight loss  [] Fever  [] Chills Cardiac: [] Chest pain   [] Chest pressure   [] Palpitations   [] Shortness of breath when laying flat   [] Shortness of breath at rest   [] Shortness of breath with exertion. Vascular:  [] Pain in legs with walking   [] Pain in legs at rest   [] Pain in legs when laying flat   [] Claudication   [] Pain in feet when walking  [] Pain in feet at rest  [] Pain in feet when laying flat   [] History of DVT   [] Phlebitis   [] Swelling in legs   [] Varicose veins   [] Non-healing ulcers Pulmonary:   [] Uses home oxygen   [] Productive cough   [] Hemoptysis   [] Wheeze  [] COPD   [] Asthma Neurologic:  [] Dizziness  [] Blackouts   [] Seizures   [] History of stroke   [] History of TIA  [] Aphasia   [] Temporary blindness   [] Dysphagia   [] Weakness or numbness in arms   [] Weakness or numbness in legs Musculoskeletal:  [] Arthritis   [] Joint swelling   [] Joint pain   [] Low back pain Hematologic:  [] Easy bruising  [] Easy bleeding   [] Hypercoagulable state   [x] Anemic  [] Hepatitis Gastrointestinal:  [] Blood in stool   [] Vomiting blood  [] Gastroesophageal reflux/heartburn   [] Difficulty swallowing. Genitourinary:  [x] Chronic kidney disease   [] Difficult urination  [] Frequent urination  [] Burning with urination   [] Blood in urine Skin:  [] Rashes   [] Ulcers   [] Wounds Psychological:  [] History of anxiety   []  History of major depression.  Physical Examination  Vitals:   12/11/15 1006 12/11/15 1114  BP: (!) 186/82 (!) 190/83  Pulse: 73   Resp: 18 18  SpO2: 100% 98%   There is no height or weight on file to calculate BMI. Gen: WD/WN, NAD Head: [place]/AT, No temporalis wasting. Prominent temp pulse not noted. Ear/Nose/Throat: Hearing grossly intact, nares w/o erythema or drainage, oropharynx w/o Erythema/Exudate,  Eyes: Conjunctiva clear, sclera non-icteric Neck: Trachea midline.  No JVD.  Pulmonary:  Good air movement, respirations not labored, no use of accessory muscles.   Cardiac: RRR, normal S1, S2. Vascular: Thrill present in her access Vessel Right Left  Radial Palpable Palpable                                   Gastrointestinal: soft, non-tender/non-distended. No guarding/reflex.  Musculoskeletal: M/S 5/5 throughout.  Extremities without ischemic changes.  No deformity or atrophy.  Neurologic: Sensation grossly intact in extremities.  Symmetrical.  Speech is fluent. Motor exam as listed above. Psychiatric: Judgment intact, Mood & affect appropriate for pt's clinical situation. Dermatologic: No rashes or ulcers noted.  No cellulitis or open wounds. Lymph : No Cervical, Axillary, or Inguinal lymphadenopathy.  CBC Lab Results  Component Value Date   WBC 10.3 12/08/2015   HGB 11.6 (L) 12/08/2015   HCT 35.3 12/08/2015   MCV 87.6 12/08/2015   PLT 211 12/08/2015    BMET    Component Value Date/Time   NA 136 12/08/2015 1356   NA 137 12/29/2013 2029   K 4.9 12/08/2015 1356   K 4.3 12/29/2013 2029   CL 99 (L) 12/08/2015 1356   CL 104 12/29/2013 2029   CO2 24 12/08/2015 1356   CO2 25 12/29/2013 2029   GLUCOSE 56 (L) 12/08/2015 1356   GLUCOSE 357 (H) 12/29/2013 2029   BUN 81 (H) 12/08/2015 1356   BUN 25 (H) 12/29/2013 2029   CREATININE 9.28 (H) 12/08/2015 1356   CREATININE 1.55 (H) 12/29/2013 2029   CALCIUM 8.5 (L) 12/08/2015 1356   CALCIUM 8.1 (L) 12/29/2013 2029   GFRNONAA 4 (L) 12/08/2015 1356   GFRNONAA 37 (L) 12/29/2013 2029   GFRNONAA 42 (L) 07/13/2013 1941   GFRAA 5 (L) 12/08/2015 1356   GFRAA 45 (L) 12/29/2013 2029   GFRAA 49 (L) 07/13/2013 1941   Estimated Creatinine Clearance: 8 mL/min (by C-G formula based on SCr of 9.28 mg/dL (H)).  COAG Lab Results  Component Value Date   INR 1.05 08/27/2015   INR 1.12 07/21/2015    Radiology No results found.    Assessment/Plan 1. ESRD with functional dialysis access. No longer needs her PermCath. Will remove today. Risks and benefits discussed. 2. Diabetes. Stable  on her outpatient medications 3. Hypertension. Stable on her outpatient medications   Leotis Pain, MD  12/15/2015 12:41 PM

## 2015-12-17 ENCOUNTER — Encounter: Payer: Self-pay | Admitting: *Deleted

## 2015-12-17 ENCOUNTER — Ambulatory Visit: Payer: BLUE CROSS/BLUE SHIELD | Admitting: Cardiology

## 2015-12-17 DIAGNOSIS — N2581 Secondary hyperparathyroidism of renal origin: Secondary | ICD-10-CM | POA: Diagnosis not present

## 2015-12-17 DIAGNOSIS — N186 End stage renal disease: Secondary | ICD-10-CM | POA: Diagnosis not present

## 2015-12-17 DIAGNOSIS — Z992 Dependence on renal dialysis: Secondary | ICD-10-CM | POA: Diagnosis not present

## 2015-12-17 DIAGNOSIS — D631 Anemia in chronic kidney disease: Secondary | ICD-10-CM | POA: Diagnosis not present

## 2015-12-19 DIAGNOSIS — N186 End stage renal disease: Secondary | ICD-10-CM | POA: Diagnosis not present

## 2015-12-19 DIAGNOSIS — D631 Anemia in chronic kidney disease: Secondary | ICD-10-CM | POA: Diagnosis not present

## 2015-12-19 DIAGNOSIS — Z992 Dependence on renal dialysis: Secondary | ICD-10-CM | POA: Diagnosis not present

## 2015-12-19 DIAGNOSIS — N2581 Secondary hyperparathyroidism of renal origin: Secondary | ICD-10-CM | POA: Diagnosis not present

## 2015-12-22 DIAGNOSIS — Z992 Dependence on renal dialysis: Secondary | ICD-10-CM | POA: Diagnosis not present

## 2015-12-22 DIAGNOSIS — N186 End stage renal disease: Secondary | ICD-10-CM | POA: Diagnosis not present

## 2015-12-22 DIAGNOSIS — D631 Anemia in chronic kidney disease: Secondary | ICD-10-CM | POA: Diagnosis not present

## 2015-12-22 DIAGNOSIS — N2581 Secondary hyperparathyroidism of renal origin: Secondary | ICD-10-CM | POA: Diagnosis not present

## 2015-12-24 DIAGNOSIS — N2581 Secondary hyperparathyroidism of renal origin: Secondary | ICD-10-CM | POA: Diagnosis not present

## 2015-12-24 DIAGNOSIS — N186 End stage renal disease: Secondary | ICD-10-CM | POA: Diagnosis not present

## 2015-12-24 DIAGNOSIS — Z992 Dependence on renal dialysis: Secondary | ICD-10-CM | POA: Diagnosis not present

## 2015-12-24 DIAGNOSIS — D631 Anemia in chronic kidney disease: Secondary | ICD-10-CM | POA: Diagnosis not present

## 2015-12-25 ENCOUNTER — Encounter (INDEPENDENT_AMBULATORY_CARE_PROVIDER_SITE_OTHER): Payer: Medicare Other

## 2015-12-25 ENCOUNTER — Ambulatory Visit (INDEPENDENT_AMBULATORY_CARE_PROVIDER_SITE_OTHER): Payer: Medicare Other | Admitting: Vascular Surgery

## 2015-12-26 DIAGNOSIS — D631 Anemia in chronic kidney disease: Secondary | ICD-10-CM | POA: Diagnosis not present

## 2015-12-26 DIAGNOSIS — N186 End stage renal disease: Secondary | ICD-10-CM | POA: Diagnosis not present

## 2015-12-26 DIAGNOSIS — Z992 Dependence on renal dialysis: Secondary | ICD-10-CM | POA: Diagnosis not present

## 2015-12-26 DIAGNOSIS — N2581 Secondary hyperparathyroidism of renal origin: Secondary | ICD-10-CM | POA: Diagnosis not present

## 2015-12-29 DIAGNOSIS — D631 Anemia in chronic kidney disease: Secondary | ICD-10-CM | POA: Diagnosis not present

## 2015-12-29 DIAGNOSIS — N186 End stage renal disease: Secondary | ICD-10-CM | POA: Diagnosis not present

## 2015-12-29 DIAGNOSIS — Z992 Dependence on renal dialysis: Secondary | ICD-10-CM | POA: Diagnosis not present

## 2015-12-29 DIAGNOSIS — N2581 Secondary hyperparathyroidism of renal origin: Secondary | ICD-10-CM | POA: Diagnosis not present

## 2015-12-31 DIAGNOSIS — D631 Anemia in chronic kidney disease: Secondary | ICD-10-CM | POA: Diagnosis not present

## 2015-12-31 DIAGNOSIS — N2581 Secondary hyperparathyroidism of renal origin: Secondary | ICD-10-CM | POA: Diagnosis not present

## 2015-12-31 DIAGNOSIS — Z992 Dependence on renal dialysis: Secondary | ICD-10-CM | POA: Diagnosis not present

## 2015-12-31 DIAGNOSIS — N186 End stage renal disease: Secondary | ICD-10-CM | POA: Diagnosis not present

## 2016-01-02 DIAGNOSIS — D631 Anemia in chronic kidney disease: Secondary | ICD-10-CM | POA: Diagnosis not present

## 2016-01-02 DIAGNOSIS — N186 End stage renal disease: Secondary | ICD-10-CM | POA: Diagnosis not present

## 2016-01-02 DIAGNOSIS — Z992 Dependence on renal dialysis: Secondary | ICD-10-CM | POA: Diagnosis not present

## 2016-01-02 DIAGNOSIS — N2581 Secondary hyperparathyroidism of renal origin: Secondary | ICD-10-CM | POA: Diagnosis not present

## 2016-01-04 DIAGNOSIS — Z992 Dependence on renal dialysis: Secondary | ICD-10-CM | POA: Diagnosis not present

## 2016-01-04 DIAGNOSIS — N2581 Secondary hyperparathyroidism of renal origin: Secondary | ICD-10-CM | POA: Diagnosis not present

## 2016-01-04 DIAGNOSIS — N186 End stage renal disease: Secondary | ICD-10-CM | POA: Diagnosis not present

## 2016-01-04 DIAGNOSIS — D631 Anemia in chronic kidney disease: Secondary | ICD-10-CM | POA: Diagnosis not present

## 2016-01-07 DIAGNOSIS — N186 End stage renal disease: Secondary | ICD-10-CM | POA: Diagnosis not present

## 2016-01-07 DIAGNOSIS — D631 Anemia in chronic kidney disease: Secondary | ICD-10-CM | POA: Diagnosis not present

## 2016-01-07 DIAGNOSIS — Z992 Dependence on renal dialysis: Secondary | ICD-10-CM | POA: Diagnosis not present

## 2016-01-07 DIAGNOSIS — N2581 Secondary hyperparathyroidism of renal origin: Secondary | ICD-10-CM | POA: Diagnosis not present

## 2016-01-09 DIAGNOSIS — Z992 Dependence on renal dialysis: Secondary | ICD-10-CM | POA: Diagnosis not present

## 2016-01-09 DIAGNOSIS — N186 End stage renal disease: Secondary | ICD-10-CM | POA: Diagnosis not present

## 2016-01-09 DIAGNOSIS — N2581 Secondary hyperparathyroidism of renal origin: Secondary | ICD-10-CM | POA: Diagnosis not present

## 2016-01-09 DIAGNOSIS — D631 Anemia in chronic kidney disease: Secondary | ICD-10-CM | POA: Diagnosis not present

## 2016-01-11 DIAGNOSIS — N186 End stage renal disease: Secondary | ICD-10-CM | POA: Diagnosis not present

## 2016-01-11 DIAGNOSIS — Z992 Dependence on renal dialysis: Secondary | ICD-10-CM | POA: Diagnosis not present

## 2016-01-12 DIAGNOSIS — N186 End stage renal disease: Secondary | ICD-10-CM | POA: Diagnosis not present

## 2016-01-12 DIAGNOSIS — Z992 Dependence on renal dialysis: Secondary | ICD-10-CM | POA: Diagnosis not present

## 2016-01-12 DIAGNOSIS — D509 Iron deficiency anemia, unspecified: Secondary | ICD-10-CM | POA: Diagnosis not present

## 2016-01-12 DIAGNOSIS — D631 Anemia in chronic kidney disease: Secondary | ICD-10-CM | POA: Diagnosis not present

## 2016-01-12 DIAGNOSIS — N2581 Secondary hyperparathyroidism of renal origin: Secondary | ICD-10-CM | POA: Diagnosis not present

## 2016-01-14 DIAGNOSIS — N2581 Secondary hyperparathyroidism of renal origin: Secondary | ICD-10-CM | POA: Diagnosis not present

## 2016-01-14 DIAGNOSIS — N186 End stage renal disease: Secondary | ICD-10-CM | POA: Diagnosis not present

## 2016-01-14 DIAGNOSIS — D509 Iron deficiency anemia, unspecified: Secondary | ICD-10-CM | POA: Diagnosis not present

## 2016-01-14 DIAGNOSIS — Z992 Dependence on renal dialysis: Secondary | ICD-10-CM | POA: Diagnosis not present

## 2016-01-14 DIAGNOSIS — D631 Anemia in chronic kidney disease: Secondary | ICD-10-CM | POA: Diagnosis not present

## 2016-01-19 DIAGNOSIS — Z992 Dependence on renal dialysis: Secondary | ICD-10-CM | POA: Diagnosis not present

## 2016-01-19 DIAGNOSIS — N2581 Secondary hyperparathyroidism of renal origin: Secondary | ICD-10-CM | POA: Diagnosis not present

## 2016-01-19 DIAGNOSIS — D631 Anemia in chronic kidney disease: Secondary | ICD-10-CM | POA: Diagnosis not present

## 2016-01-19 DIAGNOSIS — N186 End stage renal disease: Secondary | ICD-10-CM | POA: Diagnosis not present

## 2016-01-19 DIAGNOSIS — D509 Iron deficiency anemia, unspecified: Secondary | ICD-10-CM | POA: Diagnosis not present

## 2016-01-21 DIAGNOSIS — N2581 Secondary hyperparathyroidism of renal origin: Secondary | ICD-10-CM | POA: Diagnosis not present

## 2016-01-21 DIAGNOSIS — N186 End stage renal disease: Secondary | ICD-10-CM | POA: Diagnosis not present

## 2016-01-21 DIAGNOSIS — D509 Iron deficiency anemia, unspecified: Secondary | ICD-10-CM | POA: Diagnosis not present

## 2016-01-21 DIAGNOSIS — D631 Anemia in chronic kidney disease: Secondary | ICD-10-CM | POA: Diagnosis not present

## 2016-01-21 DIAGNOSIS — Z992 Dependence on renal dialysis: Secondary | ICD-10-CM | POA: Diagnosis not present

## 2016-01-24 DIAGNOSIS — D631 Anemia in chronic kidney disease: Secondary | ICD-10-CM | POA: Diagnosis not present

## 2016-01-24 DIAGNOSIS — N2581 Secondary hyperparathyroidism of renal origin: Secondary | ICD-10-CM | POA: Diagnosis not present

## 2016-01-24 DIAGNOSIS — N186 End stage renal disease: Secondary | ICD-10-CM | POA: Diagnosis not present

## 2016-01-24 DIAGNOSIS — Z992 Dependence on renal dialysis: Secondary | ICD-10-CM | POA: Diagnosis not present

## 2016-01-24 DIAGNOSIS — D509 Iron deficiency anemia, unspecified: Secondary | ICD-10-CM | POA: Diagnosis not present

## 2016-01-26 DIAGNOSIS — D631 Anemia in chronic kidney disease: Secondary | ICD-10-CM | POA: Diagnosis not present

## 2016-01-26 DIAGNOSIS — N186 End stage renal disease: Secondary | ICD-10-CM | POA: Diagnosis not present

## 2016-01-26 DIAGNOSIS — N2581 Secondary hyperparathyroidism of renal origin: Secondary | ICD-10-CM | POA: Diagnosis not present

## 2016-01-26 DIAGNOSIS — E119 Type 2 diabetes mellitus without complications: Secondary | ICD-10-CM | POA: Diagnosis not present

## 2016-01-26 DIAGNOSIS — D509 Iron deficiency anemia, unspecified: Secondary | ICD-10-CM | POA: Diagnosis not present

## 2016-01-26 DIAGNOSIS — Z992 Dependence on renal dialysis: Secondary | ICD-10-CM | POA: Diagnosis not present

## 2016-01-30 DIAGNOSIS — D631 Anemia in chronic kidney disease: Secondary | ICD-10-CM | POA: Diagnosis not present

## 2016-01-30 DIAGNOSIS — N186 End stage renal disease: Secondary | ICD-10-CM | POA: Diagnosis not present

## 2016-01-30 DIAGNOSIS — Z992 Dependence on renal dialysis: Secondary | ICD-10-CM | POA: Diagnosis not present

## 2016-01-30 DIAGNOSIS — N2581 Secondary hyperparathyroidism of renal origin: Secondary | ICD-10-CM | POA: Diagnosis not present

## 2016-01-30 DIAGNOSIS — D509 Iron deficiency anemia, unspecified: Secondary | ICD-10-CM | POA: Diagnosis not present

## 2016-02-02 DIAGNOSIS — D631 Anemia in chronic kidney disease: Secondary | ICD-10-CM | POA: Diagnosis not present

## 2016-02-02 DIAGNOSIS — N2581 Secondary hyperparathyroidism of renal origin: Secondary | ICD-10-CM | POA: Diagnosis not present

## 2016-02-02 DIAGNOSIS — D509 Iron deficiency anemia, unspecified: Secondary | ICD-10-CM | POA: Diagnosis not present

## 2016-02-02 DIAGNOSIS — Z992 Dependence on renal dialysis: Secondary | ICD-10-CM | POA: Diagnosis not present

## 2016-02-02 DIAGNOSIS — N186 End stage renal disease: Secondary | ICD-10-CM | POA: Diagnosis not present

## 2016-02-04 DIAGNOSIS — D509 Iron deficiency anemia, unspecified: Secondary | ICD-10-CM | POA: Diagnosis not present

## 2016-02-04 DIAGNOSIS — Z992 Dependence on renal dialysis: Secondary | ICD-10-CM | POA: Diagnosis not present

## 2016-02-04 DIAGNOSIS — D631 Anemia in chronic kidney disease: Secondary | ICD-10-CM | POA: Diagnosis not present

## 2016-02-04 DIAGNOSIS — N2581 Secondary hyperparathyroidism of renal origin: Secondary | ICD-10-CM | POA: Diagnosis not present

## 2016-02-04 DIAGNOSIS — N186 End stage renal disease: Secondary | ICD-10-CM | POA: Diagnosis not present

## 2016-02-06 DIAGNOSIS — N2581 Secondary hyperparathyroidism of renal origin: Secondary | ICD-10-CM | POA: Diagnosis not present

## 2016-02-06 DIAGNOSIS — D631 Anemia in chronic kidney disease: Secondary | ICD-10-CM | POA: Diagnosis not present

## 2016-02-06 DIAGNOSIS — Z992 Dependence on renal dialysis: Secondary | ICD-10-CM | POA: Diagnosis not present

## 2016-02-06 DIAGNOSIS — N186 End stage renal disease: Secondary | ICD-10-CM | POA: Diagnosis not present

## 2016-02-06 DIAGNOSIS — D509 Iron deficiency anemia, unspecified: Secondary | ICD-10-CM | POA: Diagnosis not present

## 2016-02-09 DIAGNOSIS — N2581 Secondary hyperparathyroidism of renal origin: Secondary | ICD-10-CM | POA: Diagnosis not present

## 2016-02-09 DIAGNOSIS — Z992 Dependence on renal dialysis: Secondary | ICD-10-CM | POA: Diagnosis not present

## 2016-02-09 DIAGNOSIS — N186 End stage renal disease: Secondary | ICD-10-CM | POA: Diagnosis not present

## 2016-02-09 DIAGNOSIS — D631 Anemia in chronic kidney disease: Secondary | ICD-10-CM | POA: Diagnosis not present

## 2016-02-09 DIAGNOSIS — D509 Iron deficiency anemia, unspecified: Secondary | ICD-10-CM | POA: Diagnosis not present

## 2016-02-11 DIAGNOSIS — N186 End stage renal disease: Secondary | ICD-10-CM | POA: Diagnosis not present

## 2016-02-11 DIAGNOSIS — Z992 Dependence on renal dialysis: Secondary | ICD-10-CM | POA: Diagnosis not present

## 2016-02-13 DIAGNOSIS — E162 Hypoglycemia, unspecified: Secondary | ICD-10-CM | POA: Diagnosis not present

## 2016-02-13 DIAGNOSIS — N186 End stage renal disease: Secondary | ICD-10-CM | POA: Diagnosis not present

## 2016-02-13 DIAGNOSIS — N2581 Secondary hyperparathyroidism of renal origin: Secondary | ICD-10-CM | POA: Diagnosis not present

## 2016-02-13 DIAGNOSIS — E11649 Type 2 diabetes mellitus with hypoglycemia without coma: Secondary | ICD-10-CM | POA: Diagnosis not present

## 2016-02-13 DIAGNOSIS — D509 Iron deficiency anemia, unspecified: Secondary | ICD-10-CM | POA: Diagnosis not present

## 2016-02-13 DIAGNOSIS — D631 Anemia in chronic kidney disease: Secondary | ICD-10-CM | POA: Diagnosis not present

## 2016-02-13 DIAGNOSIS — Z992 Dependence on renal dialysis: Secondary | ICD-10-CM | POA: Diagnosis not present

## 2016-02-16 DIAGNOSIS — N2581 Secondary hyperparathyroidism of renal origin: Secondary | ICD-10-CM | POA: Diagnosis not present

## 2016-02-16 DIAGNOSIS — E162 Hypoglycemia, unspecified: Secondary | ICD-10-CM | POA: Diagnosis not present

## 2016-02-16 DIAGNOSIS — N186 End stage renal disease: Secondary | ICD-10-CM | POA: Diagnosis not present

## 2016-02-16 DIAGNOSIS — D631 Anemia in chronic kidney disease: Secondary | ICD-10-CM | POA: Diagnosis not present

## 2016-02-16 DIAGNOSIS — D509 Iron deficiency anemia, unspecified: Secondary | ICD-10-CM | POA: Diagnosis not present

## 2016-02-16 DIAGNOSIS — Z992 Dependence on renal dialysis: Secondary | ICD-10-CM | POA: Diagnosis not present

## 2016-02-20 DIAGNOSIS — N186 End stage renal disease: Secondary | ICD-10-CM | POA: Diagnosis not present

## 2016-02-20 DIAGNOSIS — E162 Hypoglycemia, unspecified: Secondary | ICD-10-CM | POA: Diagnosis not present

## 2016-02-20 DIAGNOSIS — N2581 Secondary hyperparathyroidism of renal origin: Secondary | ICD-10-CM | POA: Diagnosis not present

## 2016-02-20 DIAGNOSIS — Z992 Dependence on renal dialysis: Secondary | ICD-10-CM | POA: Diagnosis not present

## 2016-02-20 DIAGNOSIS — D631 Anemia in chronic kidney disease: Secondary | ICD-10-CM | POA: Diagnosis not present

## 2016-02-20 DIAGNOSIS — D509 Iron deficiency anemia, unspecified: Secondary | ICD-10-CM | POA: Diagnosis not present

## 2016-02-23 DIAGNOSIS — N2581 Secondary hyperparathyroidism of renal origin: Secondary | ICD-10-CM | POA: Diagnosis not present

## 2016-02-23 DIAGNOSIS — D631 Anemia in chronic kidney disease: Secondary | ICD-10-CM | POA: Diagnosis not present

## 2016-02-23 DIAGNOSIS — D509 Iron deficiency anemia, unspecified: Secondary | ICD-10-CM | POA: Diagnosis not present

## 2016-02-23 DIAGNOSIS — N186 End stage renal disease: Secondary | ICD-10-CM | POA: Diagnosis not present

## 2016-02-23 DIAGNOSIS — E162 Hypoglycemia, unspecified: Secondary | ICD-10-CM | POA: Diagnosis not present

## 2016-02-23 DIAGNOSIS — Z992 Dependence on renal dialysis: Secondary | ICD-10-CM | POA: Diagnosis not present

## 2016-02-24 ENCOUNTER — Ambulatory Visit: Payer: Medicaid Other | Admitting: Cardiology

## 2016-02-24 ENCOUNTER — Encounter: Payer: Self-pay | Admitting: *Deleted

## 2016-02-25 DIAGNOSIS — E162 Hypoglycemia, unspecified: Secondary | ICD-10-CM | POA: Diagnosis not present

## 2016-02-25 DIAGNOSIS — N2581 Secondary hyperparathyroidism of renal origin: Secondary | ICD-10-CM | POA: Diagnosis not present

## 2016-02-25 DIAGNOSIS — Z992 Dependence on renal dialysis: Secondary | ICD-10-CM | POA: Diagnosis not present

## 2016-02-25 DIAGNOSIS — D509 Iron deficiency anemia, unspecified: Secondary | ICD-10-CM | POA: Diagnosis not present

## 2016-02-25 DIAGNOSIS — D631 Anemia in chronic kidney disease: Secondary | ICD-10-CM | POA: Diagnosis not present

## 2016-02-25 DIAGNOSIS — N186 End stage renal disease: Secondary | ICD-10-CM | POA: Diagnosis not present

## 2016-03-01 DIAGNOSIS — E162 Hypoglycemia, unspecified: Secondary | ICD-10-CM | POA: Diagnosis not present

## 2016-03-01 DIAGNOSIS — Z992 Dependence on renal dialysis: Secondary | ICD-10-CM | POA: Diagnosis not present

## 2016-03-01 DIAGNOSIS — N186 End stage renal disease: Secondary | ICD-10-CM | POA: Diagnosis not present

## 2016-03-01 DIAGNOSIS — D509 Iron deficiency anemia, unspecified: Secondary | ICD-10-CM | POA: Diagnosis not present

## 2016-03-01 DIAGNOSIS — D631 Anemia in chronic kidney disease: Secondary | ICD-10-CM | POA: Diagnosis not present

## 2016-03-01 DIAGNOSIS — N2581 Secondary hyperparathyroidism of renal origin: Secondary | ICD-10-CM | POA: Diagnosis not present

## 2016-03-02 ENCOUNTER — Encounter: Payer: Self-pay | Admitting: *Deleted

## 2016-03-02 ENCOUNTER — Ambulatory Visit: Payer: Medicaid Other | Admitting: Cardiology

## 2016-03-05 DIAGNOSIS — E162 Hypoglycemia, unspecified: Secondary | ICD-10-CM | POA: Diagnosis not present

## 2016-03-05 DIAGNOSIS — D509 Iron deficiency anemia, unspecified: Secondary | ICD-10-CM | POA: Diagnosis not present

## 2016-03-05 DIAGNOSIS — D631 Anemia in chronic kidney disease: Secondary | ICD-10-CM | POA: Diagnosis not present

## 2016-03-05 DIAGNOSIS — Z992 Dependence on renal dialysis: Secondary | ICD-10-CM | POA: Diagnosis not present

## 2016-03-05 DIAGNOSIS — N186 End stage renal disease: Secondary | ICD-10-CM | POA: Diagnosis not present

## 2016-03-05 DIAGNOSIS — N2581 Secondary hyperparathyroidism of renal origin: Secondary | ICD-10-CM | POA: Diagnosis not present

## 2016-03-10 DIAGNOSIS — N2581 Secondary hyperparathyroidism of renal origin: Secondary | ICD-10-CM | POA: Diagnosis not present

## 2016-03-10 DIAGNOSIS — Z992 Dependence on renal dialysis: Secondary | ICD-10-CM | POA: Diagnosis not present

## 2016-03-10 DIAGNOSIS — D509 Iron deficiency anemia, unspecified: Secondary | ICD-10-CM | POA: Diagnosis not present

## 2016-03-10 DIAGNOSIS — D631 Anemia in chronic kidney disease: Secondary | ICD-10-CM | POA: Diagnosis not present

## 2016-03-10 DIAGNOSIS — T82858A Stenosis of vascular prosthetic devices, implants and grafts, initial encounter: Secondary | ICD-10-CM | POA: Diagnosis not present

## 2016-03-10 DIAGNOSIS — I871 Compression of vein: Secondary | ICD-10-CM | POA: Diagnosis not present

## 2016-03-10 DIAGNOSIS — N186 End stage renal disease: Secondary | ICD-10-CM | POA: Diagnosis not present

## 2016-03-10 DIAGNOSIS — E162 Hypoglycemia, unspecified: Secondary | ICD-10-CM | POA: Diagnosis not present

## 2016-03-11 DIAGNOSIS — H81319 Aural vertigo, unspecified ear: Secondary | ICD-10-CM | POA: Diagnosis not present

## 2016-03-11 DIAGNOSIS — E119 Type 2 diabetes mellitus without complications: Secondary | ICD-10-CM | POA: Diagnosis not present

## 2016-03-11 DIAGNOSIS — N182 Chronic kidney disease, stage 2 (mild): Secondary | ICD-10-CM | POA: Diagnosis not present

## 2016-03-11 DIAGNOSIS — I509 Heart failure, unspecified: Secondary | ICD-10-CM | POA: Diagnosis not present

## 2016-03-12 DIAGNOSIS — D631 Anemia in chronic kidney disease: Secondary | ICD-10-CM | POA: Diagnosis not present

## 2016-03-12 DIAGNOSIS — Z23 Encounter for immunization: Secondary | ICD-10-CM | POA: Diagnosis not present

## 2016-03-12 DIAGNOSIS — E162 Hypoglycemia, unspecified: Secondary | ICD-10-CM | POA: Diagnosis not present

## 2016-03-12 DIAGNOSIS — D509 Iron deficiency anemia, unspecified: Secondary | ICD-10-CM | POA: Diagnosis not present

## 2016-03-12 DIAGNOSIS — N2581 Secondary hyperparathyroidism of renal origin: Secondary | ICD-10-CM | POA: Diagnosis not present

## 2016-03-12 DIAGNOSIS — E11649 Type 2 diabetes mellitus with hypoglycemia without coma: Secondary | ICD-10-CM | POA: Diagnosis not present

## 2016-03-12 DIAGNOSIS — N186 End stage renal disease: Secondary | ICD-10-CM | POA: Diagnosis not present

## 2016-03-12 DIAGNOSIS — Z992 Dependence on renal dialysis: Secondary | ICD-10-CM | POA: Diagnosis not present

## 2016-03-15 DIAGNOSIS — D631 Anemia in chronic kidney disease: Secondary | ICD-10-CM | POA: Diagnosis not present

## 2016-03-15 DIAGNOSIS — E162 Hypoglycemia, unspecified: Secondary | ICD-10-CM | POA: Diagnosis not present

## 2016-03-15 DIAGNOSIS — N2581 Secondary hyperparathyroidism of renal origin: Secondary | ICD-10-CM | POA: Diagnosis not present

## 2016-03-15 DIAGNOSIS — Z23 Encounter for immunization: Secondary | ICD-10-CM | POA: Diagnosis not present

## 2016-03-15 DIAGNOSIS — N186 End stage renal disease: Secondary | ICD-10-CM | POA: Diagnosis not present

## 2016-03-15 DIAGNOSIS — D509 Iron deficiency anemia, unspecified: Secondary | ICD-10-CM | POA: Diagnosis not present

## 2016-03-17 DIAGNOSIS — N186 End stage renal disease: Secondary | ICD-10-CM | POA: Diagnosis not present

## 2016-03-17 DIAGNOSIS — D509 Iron deficiency anemia, unspecified: Secondary | ICD-10-CM | POA: Diagnosis not present

## 2016-03-17 DIAGNOSIS — Z23 Encounter for immunization: Secondary | ICD-10-CM | POA: Diagnosis not present

## 2016-03-17 DIAGNOSIS — E162 Hypoglycemia, unspecified: Secondary | ICD-10-CM | POA: Diagnosis not present

## 2016-03-17 DIAGNOSIS — N2581 Secondary hyperparathyroidism of renal origin: Secondary | ICD-10-CM | POA: Diagnosis not present

## 2016-03-17 DIAGNOSIS — D631 Anemia in chronic kidney disease: Secondary | ICD-10-CM | POA: Diagnosis not present

## 2016-03-19 DIAGNOSIS — D631 Anemia in chronic kidney disease: Secondary | ICD-10-CM | POA: Diagnosis not present

## 2016-03-19 DIAGNOSIS — D509 Iron deficiency anemia, unspecified: Secondary | ICD-10-CM | POA: Diagnosis not present

## 2016-03-19 DIAGNOSIS — N186 End stage renal disease: Secondary | ICD-10-CM | POA: Diagnosis not present

## 2016-03-19 DIAGNOSIS — N2581 Secondary hyperparathyroidism of renal origin: Secondary | ICD-10-CM | POA: Diagnosis not present

## 2016-03-19 DIAGNOSIS — Z23 Encounter for immunization: Secondary | ICD-10-CM | POA: Diagnosis not present

## 2016-03-19 DIAGNOSIS — E162 Hypoglycemia, unspecified: Secondary | ICD-10-CM | POA: Diagnosis not present

## 2016-03-22 DIAGNOSIS — D631 Anemia in chronic kidney disease: Secondary | ICD-10-CM | POA: Diagnosis not present

## 2016-03-22 DIAGNOSIS — E162 Hypoglycemia, unspecified: Secondary | ICD-10-CM | POA: Diagnosis not present

## 2016-03-22 DIAGNOSIS — N2581 Secondary hyperparathyroidism of renal origin: Secondary | ICD-10-CM | POA: Diagnosis not present

## 2016-03-22 DIAGNOSIS — Z23 Encounter for immunization: Secondary | ICD-10-CM | POA: Diagnosis not present

## 2016-03-22 DIAGNOSIS — N186 End stage renal disease: Secondary | ICD-10-CM | POA: Diagnosis not present

## 2016-03-22 DIAGNOSIS — D509 Iron deficiency anemia, unspecified: Secondary | ICD-10-CM | POA: Diagnosis not present

## 2016-03-23 ENCOUNTER — Other Ambulatory Visit: Payer: Self-pay | Admitting: Internal Medicine

## 2016-03-23 DIAGNOSIS — Z1231 Encounter for screening mammogram for malignant neoplasm of breast: Secondary | ICD-10-CM

## 2016-03-24 DIAGNOSIS — Z23 Encounter for immunization: Secondary | ICD-10-CM | POA: Diagnosis not present

## 2016-03-24 DIAGNOSIS — E162 Hypoglycemia, unspecified: Secondary | ICD-10-CM | POA: Diagnosis not present

## 2016-03-24 DIAGNOSIS — N2581 Secondary hyperparathyroidism of renal origin: Secondary | ICD-10-CM | POA: Diagnosis not present

## 2016-03-24 DIAGNOSIS — D631 Anemia in chronic kidney disease: Secondary | ICD-10-CM | POA: Diagnosis not present

## 2016-03-24 DIAGNOSIS — D509 Iron deficiency anemia, unspecified: Secondary | ICD-10-CM | POA: Diagnosis not present

## 2016-03-24 DIAGNOSIS — N186 End stage renal disease: Secondary | ICD-10-CM | POA: Diagnosis not present

## 2016-03-29 DIAGNOSIS — N186 End stage renal disease: Secondary | ICD-10-CM | POA: Diagnosis not present

## 2016-03-29 DIAGNOSIS — N2581 Secondary hyperparathyroidism of renal origin: Secondary | ICD-10-CM | POA: Diagnosis not present

## 2016-03-29 DIAGNOSIS — E162 Hypoglycemia, unspecified: Secondary | ICD-10-CM | POA: Diagnosis not present

## 2016-03-29 DIAGNOSIS — D509 Iron deficiency anemia, unspecified: Secondary | ICD-10-CM | POA: Diagnosis not present

## 2016-03-29 DIAGNOSIS — Z23 Encounter for immunization: Secondary | ICD-10-CM | POA: Diagnosis not present

## 2016-03-29 DIAGNOSIS — D631 Anemia in chronic kidney disease: Secondary | ICD-10-CM | POA: Diagnosis not present

## 2016-04-01 DIAGNOSIS — Z23 Encounter for immunization: Secondary | ICD-10-CM | POA: Diagnosis not present

## 2016-04-01 DIAGNOSIS — E162 Hypoglycemia, unspecified: Secondary | ICD-10-CM | POA: Diagnosis not present

## 2016-04-01 DIAGNOSIS — N2581 Secondary hyperparathyroidism of renal origin: Secondary | ICD-10-CM | POA: Diagnosis not present

## 2016-04-01 DIAGNOSIS — N186 End stage renal disease: Secondary | ICD-10-CM | POA: Diagnosis not present

## 2016-04-01 DIAGNOSIS — D631 Anemia in chronic kidney disease: Secondary | ICD-10-CM | POA: Diagnosis not present

## 2016-04-01 DIAGNOSIS — D509 Iron deficiency anemia, unspecified: Secondary | ICD-10-CM | POA: Diagnosis not present

## 2016-04-05 DIAGNOSIS — N2581 Secondary hyperparathyroidism of renal origin: Secondary | ICD-10-CM | POA: Diagnosis not present

## 2016-04-05 DIAGNOSIS — D631 Anemia in chronic kidney disease: Secondary | ICD-10-CM | POA: Diagnosis not present

## 2016-04-05 DIAGNOSIS — Z992 Dependence on renal dialysis: Secondary | ICD-10-CM | POA: Diagnosis not present

## 2016-04-05 DIAGNOSIS — N186 End stage renal disease: Secondary | ICD-10-CM | POA: Diagnosis not present

## 2016-04-07 DIAGNOSIS — N2581 Secondary hyperparathyroidism of renal origin: Secondary | ICD-10-CM | POA: Diagnosis not present

## 2016-04-07 DIAGNOSIS — Z992 Dependence on renal dialysis: Secondary | ICD-10-CM | POA: Diagnosis not present

## 2016-04-07 DIAGNOSIS — D631 Anemia in chronic kidney disease: Secondary | ICD-10-CM | POA: Diagnosis not present

## 2016-04-07 DIAGNOSIS — N186 End stage renal disease: Secondary | ICD-10-CM | POA: Diagnosis not present

## 2016-04-09 DIAGNOSIS — N186 End stage renal disease: Secondary | ICD-10-CM | POA: Diagnosis not present

## 2016-04-09 DIAGNOSIS — Z992 Dependence on renal dialysis: Secondary | ICD-10-CM | POA: Diagnosis not present

## 2016-04-09 DIAGNOSIS — D631 Anemia in chronic kidney disease: Secondary | ICD-10-CM | POA: Diagnosis not present

## 2016-04-09 DIAGNOSIS — N2581 Secondary hyperparathyroidism of renal origin: Secondary | ICD-10-CM | POA: Diagnosis not present

## 2016-04-10 DIAGNOSIS — Z992 Dependence on renal dialysis: Secondary | ICD-10-CM | POA: Diagnosis not present

## 2016-04-10 DIAGNOSIS — N186 End stage renal disease: Secondary | ICD-10-CM | POA: Diagnosis not present

## 2016-04-14 DIAGNOSIS — D631 Anemia in chronic kidney disease: Secondary | ICD-10-CM | POA: Diagnosis not present

## 2016-04-14 DIAGNOSIS — D509 Iron deficiency anemia, unspecified: Secondary | ICD-10-CM | POA: Diagnosis not present

## 2016-04-14 DIAGNOSIS — Z992 Dependence on renal dialysis: Secondary | ICD-10-CM | POA: Diagnosis not present

## 2016-04-14 DIAGNOSIS — N186 End stage renal disease: Secondary | ICD-10-CM | POA: Diagnosis not present

## 2016-04-14 DIAGNOSIS — N2581 Secondary hyperparathyroidism of renal origin: Secondary | ICD-10-CM | POA: Diagnosis not present

## 2016-04-15 DIAGNOSIS — E113413 Type 2 diabetes mellitus with severe nonproliferative diabetic retinopathy with macular edema, bilateral: Secondary | ICD-10-CM | POA: Diagnosis not present

## 2016-04-19 DIAGNOSIS — Z992 Dependence on renal dialysis: Secondary | ICD-10-CM | POA: Diagnosis not present

## 2016-04-19 DIAGNOSIS — N186 End stage renal disease: Secondary | ICD-10-CM | POA: Diagnosis not present

## 2016-04-19 DIAGNOSIS — N2581 Secondary hyperparathyroidism of renal origin: Secondary | ICD-10-CM | POA: Diagnosis not present

## 2016-04-19 DIAGNOSIS — D509 Iron deficiency anemia, unspecified: Secondary | ICD-10-CM | POA: Diagnosis not present

## 2016-04-19 DIAGNOSIS — D631 Anemia in chronic kidney disease: Secondary | ICD-10-CM | POA: Diagnosis not present

## 2016-04-20 DIAGNOSIS — E113413 Type 2 diabetes mellitus with severe nonproliferative diabetic retinopathy with macular edema, bilateral: Secondary | ICD-10-CM | POA: Diagnosis not present

## 2016-04-21 ENCOUNTER — Ambulatory Visit: Payer: Medicaid Other

## 2016-04-23 DIAGNOSIS — D509 Iron deficiency anemia, unspecified: Secondary | ICD-10-CM | POA: Diagnosis not present

## 2016-04-23 DIAGNOSIS — N186 End stage renal disease: Secondary | ICD-10-CM | POA: Diagnosis not present

## 2016-04-23 DIAGNOSIS — D631 Anemia in chronic kidney disease: Secondary | ICD-10-CM | POA: Diagnosis not present

## 2016-04-23 DIAGNOSIS — N2581 Secondary hyperparathyroidism of renal origin: Secondary | ICD-10-CM | POA: Diagnosis not present

## 2016-04-23 DIAGNOSIS — Z992 Dependence on renal dialysis: Secondary | ICD-10-CM | POA: Diagnosis not present

## 2016-04-26 DIAGNOSIS — N2581 Secondary hyperparathyroidism of renal origin: Secondary | ICD-10-CM | POA: Diagnosis not present

## 2016-04-26 DIAGNOSIS — D631 Anemia in chronic kidney disease: Secondary | ICD-10-CM | POA: Diagnosis not present

## 2016-04-26 DIAGNOSIS — Z992 Dependence on renal dialysis: Secondary | ICD-10-CM | POA: Diagnosis not present

## 2016-04-26 DIAGNOSIS — D509 Iron deficiency anemia, unspecified: Secondary | ICD-10-CM | POA: Diagnosis not present

## 2016-04-26 DIAGNOSIS — N186 End stage renal disease: Secondary | ICD-10-CM | POA: Diagnosis not present

## 2016-04-30 DIAGNOSIS — D509 Iron deficiency anemia, unspecified: Secondary | ICD-10-CM | POA: Diagnosis not present

## 2016-04-30 DIAGNOSIS — Z992 Dependence on renal dialysis: Secondary | ICD-10-CM | POA: Diagnosis not present

## 2016-04-30 DIAGNOSIS — D631 Anemia in chronic kidney disease: Secondary | ICD-10-CM | POA: Diagnosis not present

## 2016-04-30 DIAGNOSIS — N2581 Secondary hyperparathyroidism of renal origin: Secondary | ICD-10-CM | POA: Diagnosis not present

## 2016-04-30 DIAGNOSIS — N186 End stage renal disease: Secondary | ICD-10-CM | POA: Diagnosis not present

## 2016-05-04 ENCOUNTER — Ambulatory Visit: Payer: Medicaid Other

## 2016-05-05 DIAGNOSIS — Z992 Dependence on renal dialysis: Secondary | ICD-10-CM | POA: Diagnosis not present

## 2016-05-05 DIAGNOSIS — N186 End stage renal disease: Secondary | ICD-10-CM | POA: Diagnosis not present

## 2016-05-05 DIAGNOSIS — D631 Anemia in chronic kidney disease: Secondary | ICD-10-CM | POA: Diagnosis not present

## 2016-05-05 DIAGNOSIS — E119 Type 2 diabetes mellitus without complications: Secondary | ICD-10-CM | POA: Diagnosis not present

## 2016-05-05 DIAGNOSIS — D509 Iron deficiency anemia, unspecified: Secondary | ICD-10-CM | POA: Diagnosis not present

## 2016-05-05 DIAGNOSIS — N2581 Secondary hyperparathyroidism of renal origin: Secondary | ICD-10-CM | POA: Diagnosis not present

## 2016-05-07 ENCOUNTER — Ambulatory Visit (INDEPENDENT_AMBULATORY_CARE_PROVIDER_SITE_OTHER): Payer: Medicare Other | Admitting: Vascular Surgery

## 2016-05-07 DIAGNOSIS — Z992 Dependence on renal dialysis: Secondary | ICD-10-CM | POA: Diagnosis not present

## 2016-05-07 DIAGNOSIS — N186 End stage renal disease: Secondary | ICD-10-CM | POA: Diagnosis not present

## 2016-05-07 DIAGNOSIS — D509 Iron deficiency anemia, unspecified: Secondary | ICD-10-CM | POA: Diagnosis not present

## 2016-05-07 DIAGNOSIS — D631 Anemia in chronic kidney disease: Secondary | ICD-10-CM | POA: Diagnosis not present

## 2016-05-07 DIAGNOSIS — N2581 Secondary hyperparathyroidism of renal origin: Secondary | ICD-10-CM | POA: Diagnosis not present

## 2016-05-10 DIAGNOSIS — Z992 Dependence on renal dialysis: Secondary | ICD-10-CM | POA: Diagnosis not present

## 2016-05-10 DIAGNOSIS — N186 End stage renal disease: Secondary | ICD-10-CM | POA: Diagnosis not present

## 2016-05-12 DIAGNOSIS — Z23 Encounter for immunization: Secondary | ICD-10-CM | POA: Diagnosis not present

## 2016-05-12 DIAGNOSIS — D509 Iron deficiency anemia, unspecified: Secondary | ICD-10-CM | POA: Diagnosis not present

## 2016-05-12 DIAGNOSIS — D631 Anemia in chronic kidney disease: Secondary | ICD-10-CM | POA: Diagnosis not present

## 2016-05-12 DIAGNOSIS — N186 End stage renal disease: Secondary | ICD-10-CM | POA: Diagnosis not present

## 2016-05-12 DIAGNOSIS — Z992 Dependence on renal dialysis: Secondary | ICD-10-CM | POA: Diagnosis not present

## 2016-05-12 DIAGNOSIS — N2581 Secondary hyperparathyroidism of renal origin: Secondary | ICD-10-CM | POA: Diagnosis not present

## 2016-05-13 ENCOUNTER — Ambulatory Visit: Payer: Medicaid Other

## 2016-05-14 DIAGNOSIS — D509 Iron deficiency anemia, unspecified: Secondary | ICD-10-CM | POA: Diagnosis not present

## 2016-05-14 DIAGNOSIS — Z23 Encounter for immunization: Secondary | ICD-10-CM | POA: Diagnosis not present

## 2016-05-14 DIAGNOSIS — Z992 Dependence on renal dialysis: Secondary | ICD-10-CM | POA: Diagnosis not present

## 2016-05-14 DIAGNOSIS — N186 End stage renal disease: Secondary | ICD-10-CM | POA: Diagnosis not present

## 2016-05-14 DIAGNOSIS — N2581 Secondary hyperparathyroidism of renal origin: Secondary | ICD-10-CM | POA: Diagnosis not present

## 2016-05-14 DIAGNOSIS — D631 Anemia in chronic kidney disease: Secondary | ICD-10-CM | POA: Diagnosis not present

## 2016-05-18 DIAGNOSIS — N2581 Secondary hyperparathyroidism of renal origin: Secondary | ICD-10-CM | POA: Diagnosis not present

## 2016-05-18 DIAGNOSIS — Z992 Dependence on renal dialysis: Secondary | ICD-10-CM | POA: Diagnosis not present

## 2016-05-18 DIAGNOSIS — N186 End stage renal disease: Secondary | ICD-10-CM | POA: Diagnosis not present

## 2016-05-18 DIAGNOSIS — E113413 Type 2 diabetes mellitus with severe nonproliferative diabetic retinopathy with macular edema, bilateral: Secondary | ICD-10-CM | POA: Diagnosis not present

## 2016-05-18 DIAGNOSIS — D631 Anemia in chronic kidney disease: Secondary | ICD-10-CM | POA: Diagnosis not present

## 2016-05-18 DIAGNOSIS — D509 Iron deficiency anemia, unspecified: Secondary | ICD-10-CM | POA: Diagnosis not present

## 2016-05-18 DIAGNOSIS — Z23 Encounter for immunization: Secondary | ICD-10-CM | POA: Diagnosis not present

## 2016-05-21 DIAGNOSIS — D631 Anemia in chronic kidney disease: Secondary | ICD-10-CM | POA: Diagnosis not present

## 2016-05-21 DIAGNOSIS — D509 Iron deficiency anemia, unspecified: Secondary | ICD-10-CM | POA: Diagnosis not present

## 2016-05-21 DIAGNOSIS — Z992 Dependence on renal dialysis: Secondary | ICD-10-CM | POA: Diagnosis not present

## 2016-05-21 DIAGNOSIS — N186 End stage renal disease: Secondary | ICD-10-CM | POA: Diagnosis not present

## 2016-05-21 DIAGNOSIS — N2581 Secondary hyperparathyroidism of renal origin: Secondary | ICD-10-CM | POA: Diagnosis not present

## 2016-05-21 DIAGNOSIS — Z23 Encounter for immunization: Secondary | ICD-10-CM | POA: Diagnosis not present

## 2016-05-25 ENCOUNTER — Encounter (INDEPENDENT_AMBULATORY_CARE_PROVIDER_SITE_OTHER): Payer: Self-pay | Admitting: Vascular Surgery

## 2016-05-25 ENCOUNTER — Encounter (INDEPENDENT_AMBULATORY_CARE_PROVIDER_SITE_OTHER): Payer: Self-pay

## 2016-05-25 ENCOUNTER — Ambulatory Visit (INDEPENDENT_AMBULATORY_CARE_PROVIDER_SITE_OTHER): Payer: Medicare Other | Admitting: Vascular Surgery

## 2016-05-25 VITALS — BP 147/68 | HR 66 | Resp 16 | Ht 69.0 in | Wt 220.0 lb

## 2016-05-25 DIAGNOSIS — N186 End stage renal disease: Secondary | ICD-10-CM

## 2016-05-25 DIAGNOSIS — I10 Essential (primary) hypertension: Secondary | ICD-10-CM | POA: Diagnosis not present

## 2016-05-25 DIAGNOSIS — N184 Chronic kidney disease, stage 4 (severe): Secondary | ICD-10-CM

## 2016-05-25 DIAGNOSIS — E1122 Type 2 diabetes mellitus with diabetic chronic kidney disease: Secondary | ICD-10-CM | POA: Diagnosis not present

## 2016-05-25 DIAGNOSIS — Z992 Dependence on renal dialysis: Secondary | ICD-10-CM

## 2016-05-25 NOTE — Progress Notes (Signed)
MRN : [id number]  Jacqueline Rodriguez is a 56 y.o. (Sep 06, 1960) female who presents with chief complaint of  Chief Complaint  Patient presents with  . Re-evaluation    Discuss PD placement  .  History of Present Illness: Patient returns today in follow up of ESRD. She had a fistula placed last year and is still using this for hemodialysis. She desires to switch over to peritoneal dialysis, and is here today to discuss this option. She denies previous abdominal surgery. She has no fevers or chills. She is tolerating hemodialysis just fine, but does not like the stress on the body and how poorly she feels on dialysis days.  Current Outpatient Prescriptions  Medication Sig Dispense Refill  . acetaminophen (TYLENOL) 500 MG tablet Take 500 mg by mouth every 6 (six) hours as needed.    [name] albuterol (PROVENTIL HFA;VENTOLIN HFA) 108 (90 Base) MCG/ACT inhaler Inhale 2 puffs into the lungs every 6 (six) hours as needed for wheezing or shortness of breath. 1 Inhaler 2  . amLODipine (NORVASC) 5 MG tablet Take 1 tablet (5 mg total) by mouth daily. 30 tablet 0  . aspirin EC 81 MG EC tablet Take 1 tablet (81 mg total) by mouth daily. 30 tablet 0  . Calcium-Magnesium-Vitamin D (CALCIUM 1200+D3 PO) Take 1 tablet by mouth daily.    . carvedilol (COREG) 6.25 MG tablet Take 6.25 mg by mouth 2 (two) times daily with a meal.    . cetirizine (ZYRTEC) 10 MG tablet Take 10 mg by mouth daily as needed for allergies.    . ferrous sulfate 325 (65 FE) MG tablet Take 325 mg by mouth 2 (two) times daily.    [name] gabapentin (NEURONTIN) 100 MG capsule Take 100 mg by mouth 3 (three) times daily as needed. For neuropathy.  2  . glipiZIDE (GLUCOTROL) 5 MG tablet Take 5 mg by mouth daily before breakfast.     . GLUCOSAMINE-CHONDROITIN PO Take 4,000 mg by mouth daily.    . hydrALAZINE (APRESOLINE) 25 MG tablet Take 25 mg by mouth 3 (three) times daily.    [name] HYDROcodone-acetaminophen (NORCO) 5-325 MG tablet Take 1 tablet by mouth every  6 (six) hours as needed for moderate pain. 30 tablet 0  . isosorbide mononitrate (IMDUR) 30 MG 24 hr tablet Take 30 mg by mouth daily.    [name] lisinopril (PRINIVIL,ZESTRIL) 10 MG tablet Take 10 mg by mouth every other day.     . Magnesium Oxide 250 MG TABS Take 1 tablet by mouth daily.    . meclizine (ANTIVERT) 25 MG tablet Take 1 tablet (25 mg total) by mouth 3 (three) times daily as needed for dizziness. 30 tablet 0   No current facility-administered medications for this visit.     Past Medical History:  Diagnosis Date  . Anemia   . Chronic bronchitis ([place])   . Chronic diastolic CHF (congestive heart failure) ([place])   . CKD (chronic kidney disease), stage III   . COPD (chronic obstructive pulmonary disease) ([place])   . Diabetes mellitus with renal complications ([place])   . Essential hypertension   . Obesity   . Pulmonary hypertension ([place])   . Renal insufficiency 09/19/2015   Stage 3 CKD. Beginning dialysis.    Past Surgical History:  Procedure Laterality Date  . AV FISTULA PLACEMENT Left 10/01/2015   Procedure: ARTERIOVENOUS (AV) FISTULA CREATION ( BRACHIAL CEPHALIC );  Surgeon: Algernon Huxley, MD;  Location: [facility];  Service: Vascular;  Laterality: Left;  .  PERIPHERAL VASCULAR CATHETERIZATION N/A 07/21/2015   Procedure: Dialysis/Perma Catheter;  Surgeon: Algernon Huxley, MD;  Location: [place] [facility];  Service: Cardiovascular;  Laterality: N/A;  . PERIPHERAL VASCULAR CATHETERIZATION N/A 12/11/2015   Procedure: Dialysis/Perma Catheter Removal;  Surgeon: Algernon Huxley, MD;  Location: [place] [facility];  Service: Cardiovascular;  Laterality: N/A;    Social History Social History  Substance Use Topics  . Smoking status: Former Smoker    Types: Cigarettes    Quit date: 03/24/1985  . Smokeless tobacco: Never Used  . Alcohol use No     Family History Family History  Problem Relation Age of Onset  . Hypertension Mother   . Diabetes Mellitus II Mother   . CAD Father   .  Hypertension Father   . Heart attack Father     Allergies  Allergen Reactions  . Ambien [Zolpidem] Other (See Comments)    hallucinations     REVIEW OF SYSTEMS (Negative unless checked)  Constitutional: [] Weight loss  [] Fever  [] Chills Cardiac: [] Chest pain   [] Chest pressure   [] Palpitations   [] Shortness of breath when laying flat   [] Shortness of breath at rest   [] Shortness of breath with exertion. Vascular:  [] Pain in legs with walking   [] Pain in legs at rest   [] Pain in legs when laying flat   [] Claudication   [] Pain in feet when walking  [] Pain in feet at rest  [] Pain in feet when laying flat   [] History of DVT   [] Phlebitis   [x] Swelling in legs   [] Varicose veins   [] Non-healing ulcers Pulmonary:   [] Uses home oxygen   [] Productive cough   [] Hemoptysis   [] Wheeze  [] COPD   [] Asthma Neurologic:  [] Dizziness  [] Blackouts   [] Seizures   [] History of stroke   [] History of TIA  [] Aphasia   [] Temporary blindness   [] Dysphagia   [] Weakness or numbness in arms   [] Weakness or numbness in legs Musculoskeletal:  [x] Arthritis   [] Joint swelling   [] Joint pain   [] Low back pain Hematologic:  [] Easy bruising  [] Easy bleeding   [] Hypercoagulable state   [] Anemic   Gastrointestinal:  [] Blood in stool   [] Vomiting blood  [] Gastroesophageal reflux/heartburn   [] Abdominal pain Genitourinary:  [x] Chronic kidney disease   [] Difficult urination  [] Frequent urination  [] Burning with urination   [] Hematuria Skin:  [] Rashes   [] Ulcers   [] Wounds Psychological:  [] History of anxiety   []  History of major depression.  Physical Examination  BP (!) 147/68 (BP Location: Right Arm)   Pulse 66   Resp 16   Ht 5\' 9"  (1.753 m)   Wt 220 lb (99.8 kg)   LMP 09/18/2013 (Approximate) Comment: years ago per pt prior to 2015  BMI 32.49 kg/m  Gen:  WD/WN, NAD Head: [place]/AT, No temporalis wasting. Ear/Nose/Throat: Hearing grossly intact, nares w/o erythema or drainage, trachea midline Eyes: Conjunctiva clear.  Sclera non-icteric Neck: Supple.  No JVD.  Pulmonary:  Good air movement, no use of accessory muscles.  Cardiac: RRR, normal S1, S2 Vascular: Good thrill in left arm AV fistula Vessel Right Left  Radial Palpable Palpable                                   Gastrointestinal: soft, non-tender/non-distended. No guarding/reflex. No previous surgical scars Musculoskeletal: M/S 5/5 throughout.  No deformity or atrophy. 1+ bilateral lower extremity edema. Neurologic: Sensation grossly intact  in extremities.  Symmetrical.  Speech is fluent.  Psychiatric: Judgment intact, Mood & affect appropriate for pt's clinical situation. Dermatologic: No rashes or ulcers noted.  No cellulitis or open wounds.       Labs No results found for this or any previous visit (from the past 2160 hour(s)).  Radiology No results found.   Assessment/Plan  Essential hypertension blood pressure control important in reducing the progression of atherosclerotic disease. On appropriate oral medications.   Diabetes ([place]) blood glucose control important in reducing the progression of atherosclerotic disease. Also, involved in wound healing. On appropriate medications.   ESRD on dialysis [facility]) The patient has end-stage renal disease. She is on hemodialysis currently, but desires to switch to peritoneal dialysis which is reasonable. I discussed the differences between hemodialysis and peritoneal dialysis. I have discussed the risks of peritoneal dialysis both in the placement of the catheter as well as long-term. She still desires to switch over to peritoneal dialysis so we will schedule her for an outpatient peritoneal dialysis catheter placement.    Leotis Pain, MD  05/25/2016 10:10 AM    This note was created with Dragon medical transcription system.  Any errors from dictation are purely unintentional

## 2016-05-25 NOTE — Patient Instructions (Signed)
Peritoneal Dialysis Dialysis is a procedure that does some of the work healthy kidneys do. Peritoneal dialysis is a kind of dialysis that uses the thin lining of the belly (peritoneum) and a fluid called dialysate to remove wastes, salt, and extra water from the blood. Before beginning peritoneal dialysis you will have surgery to place a thin, plastic tube (catheter) in your belly. The tube will be small, soft, and easy to hide. It will be used to fill your belly with fluid and drain your belly of fluid. How does it work? At the start of the session your belly will be filled with fluid. Wastes, salt, and extra water in your blood will pass through the thin lining of your belly into the fluid. At the end of the session the fluid will be drained into a bag. Then the belly will be refilled with fluid and the procedure will be repeated. The process of draining fluid from the belly and filling the belly with fluid is called an exchange. The time the fluid stays in your body before you drain it is called a dwell. The dwell varies from person to person. It usually takes from 1.5-3 hours. Peritoneal dialysis is done during the day or at night while you are sleeping. When it is done during the day it is called continuous ambulatory peritoneal dialysis (CAPD). In CAPD, exchanges are done up to 5 times a day. Each exchange takes about 30-40 minutes. You may go about your day normally between exchanges. Peritoneal dialysis that is done at night is called continuous cycling peritoneal dialysis (CCPD). In CCPD, a machine called a cycler performs exchanges while you are sleeping. Sometimes a combination of CAPD and CCPD is needed. Preparing for an exchange  Check your blood pressure if told by your doctor.  Warm the fluid bag using a dry heating pad. Leave the fluid in the bag with the cover on while doing this. Do not use a microwave or hot water to warm the fluid bag.  If you are using a machine, make sure it is  set up and programmed correctly.  Keep your tubing free of germs (sterile).  Make sure the tube in your belly and its cap are free of germs.  If you are using a machine, make sure the part that attaches the bag and tubing to the the tube in your belly is free of germs.  Make sure the area around the tube in your belly is free of germs.  Prevent new germs from getting on your tubing.  Wash your hands thoroughly. Use a gel or foam.  Close doors and windows. Turn off any fans. Make sure you are in a space without drafts or air currents. Doing these things reduces the chance of infection.  Wear a mask to cover your nose and mouth. Do this before you wash your hands and touch equipment. Keep the mask on during each exchange.  Examine the fluid bag:  Make sure the bag is the right size. Size information is on the label. Make sure it has the correct liquid.  Gently squeeze the bag to make sure there are no leaks.  Look at the color of the fluid. The fluid should be clear. You should be able to see any writing on the side of the bag clearly through the liquid. Do not use it if it is cloudy.  Check the expiration date. The expiration date is on the label. If it is past the expiration date, throw the  bag away. How to do an exchange These steps show how exchanges are commonly done. The actual way in which an exchange should be done varies from person to person. Always do exchanges exactly the way that your doctor has trained you to. Remember to always wash your hands before touching any tubing. This is very important. It helps prevent infection. CAPD 1. Hang your fluid bag above your belly on the IV pole. 2. Place a drain bag below your belly. 3. Pull out the ring of y-shaped tubing that connects both bags. 4. Uncap the tube that is connected to the tube in your belly (transfer set). Immediately attach it to the y-shaped tubing of the fluid and drain bags. 5. Twist open the clamp on the  transfer set. This will cause the fluid in your belly to drain through the tube that goes to the drain bag (drain line). It usually takes about 20 minutes for all of the fluid to drain. 6. Once the fluid has finished draining, twist lock the clamp of the transfer set. Then clamp the drain line. 7. Break the seal (frangible) on the tubing that is connected to the fluid bag (fill line). Then unclamp the drain line. Air bubbles will flow into the drain line. Make sure your transfer set is still locked so that no air gets into the belly. 8. Count to 5. Then clamp the drain line. 9. Twist the transfer set clamp to open it. Fluid will flow into your belly. This should take about 10 minutes. 10. When this is done, twist the transfer set clamp to lock it. Then clamp the tubing of the fill line. 11. Detach the tubing from the tube in your belly. Cap the transfer set right away. Tape the transfer set to your belly as told. 12. Look at the fluid that has drained. It may look like urine, but it should be clear. 13. Weigh the drain bag. Write down how much was drained. 14. Check your blood pressure, temperature, and pulse if it is the first exchange of the day. 15. Allow the fluid to stay in the belly for as long as told by your doctor. You may go about your day while the fluid is in your belly. If your body cannot go all night without an exchange, a machine may be used to perform exchanges while you sleep. The machine is similar to the one used during CCPD. CCPD 1. When you are ready for bed, put the fluid bags onto the machine. Put on exactly the number of bags that your doctor said to use. 2. The machine has a small cartridge (cassette) with tubing that attaches to each fluid bag and the tube that goes to the patient (patient line). Depending on the number of fluid bags you are to use, all the tubes on the cassette may need to be connected to separate fluid bags. If you use fewer bags than there are tubes on the  cassette, the extra lines will need to be clamped. 3. Insert the cassette with the attached tubing into the front of the machine. 4. Make sure that the drain line extends to the toilet. The tip of the drain line should not touch the water in the toilet bowl. 5. Pull the ring on the tubing from the fluid bag on the heating pad of the machine and attach it to the first tube of the cassette. Then break the seal on the tubing of this fluid bag. 6. Repeat this process with  all the bags you need to use. 7. Start the machine. This will prepare (prime) the machine and tubing by filling it with fluid and getting rid of all the air in the tubes. 8. Once all lines are prepared, the machine will tell you to connect yourself. 9. Remove the pull ring from the patient line with one hand. 10. Uncap your transfer set and attach it to the patient line. 11. Twist open the clamp on the transfer set. 12. Press "GO" on the machine. It will begin the draining and filling process. The machine may do 3-5 exchanges overnight, depending on what your doctor recommended. 13. In the morning, record all volumes and times shown on the machine. 14. Press "GO" on the machine. The machine will then tell you to close all clamps. 15. Close your transfer set twist clamp and then clamp all the tubes that go to the machine. 16. When told by the machine, disconnect the patient line from the transfer set. Re-cap your transfer set right away. 17. Tape the transfer set to your belly as told. 18. Check your blood pressure, temperature, and pulse. The fluid that is in your belly in the morning will stay there during the day. If the body cannot go all day without an exchange, you may need to perform an exchange during the day. Follow the steps under CAPD if an extra exchange is needed.  Follow these instructions at home:  Follow diet instructions as told by your doctor.  Always keep the fluid bags and other supplies in a cool, clean, and  dry place.  Keep a strict schedule. Dialysis must be done every day. Do not skip a day or an exchange. Make sure to make time for each exchange.  Weigh yourself every day. Sudden weight gain may be a sign of a problem.  Take medicines as told by your doctor.  Avoid becoming constipated. Constipation prevents fluid from draining well. To prevent constipation:  Make sure you eat fiber-rich foods.  Avoid foods that cause constipation.  Increase your physical activity.  Go to the restroom when you feel you need to instead of holding it in.  Take drugs such as laxatives if told by your doctor. Only take them as told. Get help if:  You have a fever or chills.  You feel sick to your stomach (nauseous) or throw up (vomit).  You have diarrhea.  You have any problems with an exchange.  Your blood pressure increases.  You suddenly gain weight or feel short of breath.  The tube in your belly seems loose or feels like it is coming out.  The fluid that has drained from your belly is pinkish or reddish. Women having their menstrual period do not need to get help if the fluid is only a little pink or red.  There are white strands in the fluid that are large enough to get stuck in your tubing. Get help right away if:  The area around the tube in your belly swells or becomes red, tender, or painful.  There is pus coming from the area around the tube in your belly.  The fluid that has drained from your belly is cloudy.  You have belly pain or discomfort. This information is not intended to replace advice given to you by your health care provider. Make sure you discuss any questions you have with your health care provider. Document Released: 01/30/2010 Document Revised: 06/05/2015 Document Reviewed: 06/26/2012 Elsevier Interactive Patient Education  2017 [employer].

## 2016-05-25 NOTE — Assessment & Plan Note (Signed)
blood glucose control important in reducing the progression of atherosclerotic disease. Also, involved in wound healing. On appropriate medications.  

## 2016-05-25 NOTE — Assessment & Plan Note (Signed)
The patient has end-stage renal disease. She is on hemodialysis currently, but desires to switch to peritoneal dialysis which is reasonable. I discussed the differences between hemodialysis and peritoneal dialysis. I have discussed the risks of peritoneal dialysis both in the placement of the catheter as well as long-term. She still desires to switch over to peritoneal dialysis so we will schedule her for an outpatient peritoneal dialysis catheter placement.

## 2016-05-25 NOTE — Assessment & Plan Note (Signed)
blood pressure control important in reducing the progression of atherosclerotic disease. On appropriate oral medications.  

## 2016-05-26 DIAGNOSIS — N186 End stage renal disease: Secondary | ICD-10-CM | POA: Diagnosis not present

## 2016-05-26 DIAGNOSIS — Z23 Encounter for immunization: Secondary | ICD-10-CM | POA: Diagnosis not present

## 2016-05-26 DIAGNOSIS — D631 Anemia in chronic kidney disease: Secondary | ICD-10-CM | POA: Diagnosis not present

## 2016-05-26 DIAGNOSIS — N2581 Secondary hyperparathyroidism of renal origin: Secondary | ICD-10-CM | POA: Diagnosis not present

## 2016-05-26 DIAGNOSIS — Z992 Dependence on renal dialysis: Secondary | ICD-10-CM | POA: Diagnosis not present

## 2016-05-26 DIAGNOSIS — D509 Iron deficiency anemia, unspecified: Secondary | ICD-10-CM | POA: Diagnosis not present

## 2016-05-27 ENCOUNTER — Other Ambulatory Visit: Payer: Medicaid Other

## 2016-05-28 DIAGNOSIS — Z23 Encounter for immunization: Secondary | ICD-10-CM | POA: Diagnosis not present

## 2016-05-28 DIAGNOSIS — D509 Iron deficiency anemia, unspecified: Secondary | ICD-10-CM | POA: Diagnosis not present

## 2016-05-28 DIAGNOSIS — D631 Anemia in chronic kidney disease: Secondary | ICD-10-CM | POA: Diagnosis not present

## 2016-05-28 DIAGNOSIS — Z992 Dependence on renal dialysis: Secondary | ICD-10-CM | POA: Diagnosis not present

## 2016-05-28 DIAGNOSIS — N2581 Secondary hyperparathyroidism of renal origin: Secondary | ICD-10-CM | POA: Diagnosis not present

## 2016-05-28 DIAGNOSIS — N186 End stage renal disease: Secondary | ICD-10-CM | POA: Diagnosis not present

## 2016-05-31 ENCOUNTER — Other Ambulatory Visit (INDEPENDENT_AMBULATORY_CARE_PROVIDER_SITE_OTHER): Payer: Self-pay | Admitting: Vascular Surgery

## 2016-05-31 DIAGNOSIS — Z992 Dependence on renal dialysis: Secondary | ICD-10-CM | POA: Diagnosis not present

## 2016-05-31 DIAGNOSIS — N186 End stage renal disease: Secondary | ICD-10-CM | POA: Diagnosis not present

## 2016-05-31 DIAGNOSIS — D509 Iron deficiency anemia, unspecified: Secondary | ICD-10-CM | POA: Diagnosis not present

## 2016-05-31 DIAGNOSIS — D631 Anemia in chronic kidney disease: Secondary | ICD-10-CM | POA: Diagnosis not present

## 2016-05-31 DIAGNOSIS — Z23 Encounter for immunization: Secondary | ICD-10-CM | POA: Diagnosis not present

## 2016-05-31 DIAGNOSIS — N2581 Secondary hyperparathyroidism of renal origin: Secondary | ICD-10-CM | POA: Diagnosis not present

## 2016-06-01 ENCOUNTER — Ambulatory Visit: Payer: Medicare Other | Attending: Internal Medicine

## 2016-06-03 ENCOUNTER — Encounter
Admission: RE | Admit: 2016-06-03 | Discharge: 2016-06-03 | Disposition: A | Discharge status: Home / Self Care | Payer: Medicare Other | Source: Ambulatory Visit | Attending: Vascular Surgery | Admitting: Vascular Surgery

## 2016-06-03 DIAGNOSIS — N186 End stage renal disease: Secondary | ICD-10-CM | POA: Insufficient documentation

## 2016-06-03 DIAGNOSIS — I5032 Chronic diastolic (congestive) heart failure: Secondary | ICD-10-CM | POA: Insufficient documentation

## 2016-06-03 DIAGNOSIS — Z01812 Encounter for preprocedural laboratory examination: Secondary | ICD-10-CM | POA: Insufficient documentation

## 2016-06-03 DIAGNOSIS — I739 Peripheral vascular disease, unspecified: Secondary | ICD-10-CM | POA: Insufficient documentation

## 2016-06-03 DIAGNOSIS — K219 Gastro-esophageal reflux disease without esophagitis: Secondary | ICD-10-CM | POA: Insufficient documentation

## 2016-06-03 DIAGNOSIS — E669 Obesity, unspecified: Secondary | ICD-10-CM | POA: Insufficient documentation

## 2016-06-03 DIAGNOSIS — Z992 Dependence on renal dialysis: Secondary | ICD-10-CM | POA: Diagnosis not present

## 2016-06-03 DIAGNOSIS — E1122 Type 2 diabetes mellitus with diabetic chronic kidney disease: Secondary | ICD-10-CM | POA: Diagnosis not present

## 2016-06-03 DIAGNOSIS — I130 Hypertensive heart and chronic kidney disease with heart failure and stage 1 through stage 4 chronic kidney disease, or unspecified chronic kidney disease: Secondary | ICD-10-CM | POA: Insufficient documentation

## 2016-06-03 DIAGNOSIS — D631 Anemia in chronic kidney disease: Secondary | ICD-10-CM | POA: Insufficient documentation

## 2016-06-03 DIAGNOSIS — I272 Pulmonary hypertension, unspecified: Secondary | ICD-10-CM | POA: Diagnosis not present

## 2016-06-03 HISTORY — DX: Peripheral vascular disease, unspecified: I73.9

## 2016-06-03 HISTORY — DX: Gastro-esophageal reflux disease without esophagitis: K21.9

## 2016-06-03 LAB — BASIC METABOLIC PANEL
ANION GAP: 12 (ref 5–15)
BUN: 55 mg/dL — ABNORMAL HIGH (ref 6–20)
CALCIUM: 9.6 mg/dL (ref 8.9–10.3)
CO2: 25 mmol/L (ref 22–32)
Chloride: 103 mmol/L (ref 101–111)
Creatinine, Ser: 8.96 mg/dL — ABNORMAL HIGH (ref 0.44–1.00)
GFR calc Af Amer: 5 mL/min — ABNORMAL LOW (ref >60)
GFR, EST NON AFRICAN AMERICAN: 4 mL/min — AB (ref >60)
Glucose, Bld: 107 mg/dL — ABNORMAL HIGH (ref 65–99)
POTASSIUM: 5 mmol/L (ref 3.5–5.1)
Sodium: 140 mmol/L (ref 135–145)

## 2016-06-03 LAB — CBC WITH DIFFERENTIAL/PLATELET
BASOS ABS: 0 10*3/uL (ref 0–0.1)
BASOS PCT: 1 %
Eosinophils Absolute: 0.3 10*3/uL (ref 0–0.7)
Eosinophils Relative: 4 %
HCT: 37 % (ref 35.0–47.0)
HEMOGLOBIN: 12 g/dL (ref 12.0–16.0)
Lymphocytes Relative: 14 %
Lymphs Abs: 1 10*3/uL (ref 1.0–3.6)
MCH: 29.1 pg (ref 26.0–34.0)
MCHC: 32.5 g/dL (ref 32.0–36.0)
MCV: 89.5 fL (ref 80.0–100.0)
Monocytes Absolute: 0.5 10*3/uL (ref 0.2–0.9)
Monocytes Relative: 7 %
NEUTROS ABS: 5.2 10*3/uL (ref 1.4–6.5)
NEUTROS PCT: 74 %
Platelets: 174 10*3/uL (ref 150–440)
RBC: 4.13 MIL/uL (ref 3.80–5.20)
RDW: 17 % — ABNORMAL HIGH (ref 11.5–14.5)
WBC: 7 10*3/uL (ref 3.6–11.0)

## 2016-06-03 LAB — SURGICAL PCR SCREEN
MRSA, PCR: NEGATIVE
Staphylococcus aureus: NEGATIVE

## 2016-06-03 LAB — PROTIME-INR
INR: 1.11
Prothrombin Time: 14.3 seconds (ref 11.4–15.2)

## 2016-06-03 LAB — TYPE AND SCREEN
ABO/RH(D): A NEG
ANTIBODY SCREEN: NEGATIVE

## 2016-06-03 LAB — APTT: APTT: 32 s (ref 24–36)

## 2016-06-03 NOTE — Pre-Procedure Instructions (Signed)
Hinderliter, Valetta Fuller, MD - 05/04/2016 12:20 PM EDT Formatting of this note may be different from the original. Cardiology Consultation Note  Requesting Provider: Pcp, None Per Patient  Primary Provider: No PCP Per Patient   Reason for Consult:  Evaluation of cardiovascular risk profile prior to kidney transplantation.  Assessment & Plan: 1. Encounter for pre-transplant evaluation for chronic kidney disease Ms. Rugg has important risk factors for atherosclerosis, including diabetes, hypertension, and end-stage renal disease, but has normal left ventricular function by echocardiography and no evidence of ischemia on a recent myocardial stress perfusion scan. She is not at excessive risk of a cardiovascular complication in association with noncardiac surgery, and is a satisfactory candidate for a transplant from the cardiac standpoint.  I encouraged Ms. Santore to continue treatment with aspirin, lisinopril, and her other antihypertensive medications. We discussed the value of weight loss in minimizing her risk of perioperative complications and in improving her overall cardiovascular risk profile.      ECG 12 Lead1/23/2018 [facility] Component Name Value Ref Range  EKG Ventricular Rate 72 BPM   EKG Atrial Rate 72 BPM   EKG P-R Interval 150 ms  EKG QRS Duration 110 ms  EKG Q-T Interval 422 ms  EKG QTC Calculation 462 ms  EKG Calculated P Axis 22 degrees   EKG Calculated R Axis -55 degrees   EKG Calculated T Axis 77 degrees   Result Narrative  NORMAL SINUS RHYTHM LEFT ANTERIOR FASCICULAR BLOCK NO PREVIOUS ECGS AVAILABLE Confirmed by Ishmael Holter MD, Charles [phone]) on 02/04/2016 2:50:43 PM    Pertinent Test Results: ECG: (02/03/2016): Normal sinus rhythm; left anterior fascicular block; probably left ventricular hypertrophy.  Echocardiogram: (09/17/2015 at [facility]): Moderate left ventricular hypertrophy with normal chamber size and contraction (estimated ejection  fraction greater than 55%); mildly dilated left atrium; mild right ventricular enlargement with normal contraction and mild tricuspid regurgitation with a normal estimated pulmonary arterial systolic pressure; mildly dilated right atrium; no pericardial effusion.  Myocardial stress perfusion study: (09/17/2015 at [facility]): No fixed or reversible perfusion defect; estimated ejection fraction equal 53%.

## 2016-06-03 NOTE — Pre-Procedure Instructions (Signed)
Met B, MRSA nasal swab results faxed to Dr. Bunnie Domino office for evaluation and treatment as indicated.

## 2016-06-03 NOTE — Patient Instructions (Signed)
  Your procedure is scheduled [id number] Jun 10, 2016. Report to Same Day Surgery. To find out your arrival time please call [phone] between 1PM - 3PM on Wednesday Jun 09, 2017.  Remember: Instructions that are not followed completely may result in serious medical risk, up to and including death, or upon the discretion of your surgeon and anesthesiologist your surgery may need to be rescheduled.    _x___ 1. Do not eat food or drink liquids after midnight. No gum chewing or hard candies.     ____ 2. No Alcohol for 24 hours before or after surgery.   ____ 3. Bring all medications with you on the day of surgery if instructed.    __x__ 4. Notify your doctor if there is any change in your medical condition     (cold, fever, infections).    _____ 5. No smoking 24 hours prior to surgery.     Do not wear jewelry, make-up, hairpins, clips or nail polish.  Do not wear lotions, powders, or perfumes.   Do not shave 48 hours prior to surgery. Men may shave face and neck.  Do not bring valuables to the hospital.    [facility] is not responsible for any belongings or valuables.               Contacts, dentures or bridgework may not be worn into surgery.  Leave your suitcase in the car. After surgery it may be brought to your room.  For patients admitted to the hospital, discharge time is determined by your treatment team.   Patients discharged the day of surgery will not be allowed to drive home.    Please read over the following fact sheets that you were given:   [facility] Preparing for Surgery  _x___ Take these medicines the morning of surgery with A SIP OF WATER:    1. amLODipine (NORVASC)  2. carvedilol (COREG)  3. gabapentin (NEURONTIN)  4. hydrALAZINE (APRESOLINE)  5. isosorbide mononitrate (IMDUR)  6. lisinopril (PRINIVIL,ZESTRIL)   ____ Fleet Enema (as directed)   _x___ Use CHG Soap as directed on instruction sheet  _x_ Use inhalers on the day of surgery and bring to  hospital day of surgery  ____ Stop metformin 2 days prior to surgery    ____ Take 1/2 of usual insulin dose the night before surgery and none on the morning of surgery.   ____ Stop Coumadin/Plavix/aspirin on does not apply.  ____ Stop Anti-inflammatories such as Advil, Aleve, Ibuprofen, Motrin, Naproxen,  Naprosyn, Goodies powders or aspirin products. OK to take Tylenol.   _x___ Stop supplements: GLUCOSAMINE-CHONDROITIN until after surgery.    ____ Bring C-Pap to the hospital.

## 2016-06-07 DIAGNOSIS — D509 Iron deficiency anemia, unspecified: Secondary | ICD-10-CM | POA: Diagnosis not present

## 2016-06-07 DIAGNOSIS — D631 Anemia in chronic kidney disease: Secondary | ICD-10-CM | POA: Diagnosis not present

## 2016-06-07 DIAGNOSIS — Z23 Encounter for immunization: Secondary | ICD-10-CM | POA: Diagnosis not present

## 2016-06-07 DIAGNOSIS — Z992 Dependence on renal dialysis: Secondary | ICD-10-CM | POA: Diagnosis not present

## 2016-06-07 DIAGNOSIS — N186 End stage renal disease: Secondary | ICD-10-CM | POA: Diagnosis not present

## 2016-06-07 DIAGNOSIS — N2581 Secondary hyperparathyroidism of renal origin: Secondary | ICD-10-CM | POA: Diagnosis not present

## 2016-06-09 DIAGNOSIS — D509 Iron deficiency anemia, unspecified: Secondary | ICD-10-CM | POA: Diagnosis not present

## 2016-06-09 DIAGNOSIS — Z992 Dependence on renal dialysis: Secondary | ICD-10-CM | POA: Diagnosis not present

## 2016-06-09 DIAGNOSIS — Z23 Encounter for immunization: Secondary | ICD-10-CM | POA: Diagnosis not present

## 2016-06-09 DIAGNOSIS — D631 Anemia in chronic kidney disease: Secondary | ICD-10-CM | POA: Diagnosis not present

## 2016-06-09 DIAGNOSIS — N2581 Secondary hyperparathyroidism of renal origin: Secondary | ICD-10-CM | POA: Diagnosis not present

## 2016-06-09 DIAGNOSIS — N186 End stage renal disease: Secondary | ICD-10-CM | POA: Diagnosis not present

## 2016-06-09 MED ORDER — CEFAZOLIN SODIUM-DEXTROSE 2-4 GM/100ML-% IV SOLN: 2.0000 g | INTRAVENOUS | Status: DC

## 2016-06-10 ENCOUNTER — Ambulatory Visit
Admission: RE | Admit: 2016-06-10 | Discharge: 2016-06-10 | Disposition: A | Discharge status: Home / Self Care | Payer: Medicare Other | Source: Ambulatory Visit | Attending: Vascular Surgery | Admitting: Vascular Surgery

## 2016-06-10 ENCOUNTER — Encounter
Admission: RE | Disposition: A | Discharge status: Home / Self Care | Payer: Self-pay | Source: Ambulatory Visit | Attending: Vascular Surgery

## 2016-06-10 ENCOUNTER — Ambulatory Visit: Payer: Medicare Other | Admitting: Anesthesiology

## 2016-06-10 DIAGNOSIS — N186 End stage renal disease: Secondary | ICD-10-CM | POA: Diagnosis not present

## 2016-06-10 DIAGNOSIS — Z87891 Personal history of nicotine dependence: Secondary | ICD-10-CM | POA: Diagnosis not present

## 2016-06-10 DIAGNOSIS — I5032 Chronic diastolic (congestive) heart failure: Secondary | ICD-10-CM | POA: Diagnosis not present

## 2016-06-10 DIAGNOSIS — I132 Hypertensive heart and chronic kidney disease with heart failure and with stage 5 chronic kidney disease, or end stage renal disease: Secondary | ICD-10-CM | POA: Insufficient documentation

## 2016-06-10 DIAGNOSIS — D631 Anemia in chronic kidney disease: Secondary | ICD-10-CM | POA: Insufficient documentation

## 2016-06-10 DIAGNOSIS — Z79899 Other long term (current) drug therapy: Secondary | ICD-10-CM | POA: Diagnosis not present

## 2016-06-10 DIAGNOSIS — E1122 Type 2 diabetes mellitus with diabetic chronic kidney disease: Secondary | ICD-10-CM | POA: Diagnosis not present

## 2016-06-10 DIAGNOSIS — Z7982 Long term (current) use of aspirin: Secondary | ICD-10-CM | POA: Diagnosis not present

## 2016-06-10 DIAGNOSIS — Z992 Dependence on renal dialysis: Secondary | ICD-10-CM | POA: Insufficient documentation

## 2016-06-10 DIAGNOSIS — Z7984 Long term (current) use of oral hypoglycemic drugs: Secondary | ICD-10-CM | POA: Insufficient documentation

## 2016-06-10 DIAGNOSIS — I509 Heart failure, unspecified: Secondary | ICD-10-CM | POA: Diagnosis not present

## 2016-06-10 DIAGNOSIS — I739 Peripheral vascular disease, unspecified: Secondary | ICD-10-CM | POA: Diagnosis not present

## 2016-06-10 HISTORY — PX: CAPD INSERTION: SHX5233

## 2016-06-10 LAB — POCT I-STAT 4, (NA,K, GLUC, HGB,HCT)
Glucose, Bld: 72 mg/dL (ref 65–99)
HCT: 37 % (ref 36.0–46.0)
Hemoglobin: 12.6 g/dL (ref 12.0–15.0)
Potassium: 4.9 mmol/L (ref 3.5–5.1)
SODIUM: 140 mmol/L (ref 135–145)

## 2016-06-10 LAB — GLUCOSE, CAPILLARY
GLUCOSE-CAPILLARY: 63 mg/dL — AB (ref 65–99)
Glucose-Capillary: 90 mg/dL (ref 65–99)
Glucose-Capillary: 113 mg/dL — ABNORMAL HIGH (ref 65–99)
Glucose-Capillary: 59 mg/dL — ABNORMAL LOW (ref 65–99)

## 2016-06-10 SURGERY — LAPAROSCOPIC INSERTION CONTINUOUS AMBULATORY PERITONEAL DIALYSIS  (CAPD) CATHETER
Anesthesia: General

## 2016-06-10 MED ORDER — SUGAMMADEX SODIUM 200 MG/2ML IV SOLN
INTRAVENOUS | Status: DC | PRN
  Administered 2016-06-10: 193.2 mg via INTRAVENOUS

## 2016-06-10 MED ORDER — DEXTROSE 50 % IV SOLN
INTRAVENOUS | Status: AC
  Filled 2016-06-10: qty 50

## 2016-06-10 MED ORDER — SUGAMMADEX SODIUM 200 MG/2ML IV SOLN
INTRAVENOUS | Status: AC
  Filled 2016-06-10: qty 2

## 2016-06-10 MED ORDER — DEXAMETHASONE SODIUM PHOSPHATE 10 MG/ML IJ SOLN
INTRAMUSCULAR | Status: AC
  Filled 2016-06-10: qty 1

## 2016-06-10 MED ORDER — HYDROCODONE-ACETAMINOPHEN 5-325 MG PO TABS
ORAL_TABLET | ORAL | Status: AC
  Filled 2016-06-10: qty 1

## 2016-06-10 MED ORDER — SODIUM CHLORIDE 0.9 % IV SOLN
INTRAVENOUS | Status: DC
  Administered 2016-06-10 (×2): via INTRAVENOUS

## 2016-06-10 MED ORDER — FENTANYL CITRATE (PF) 100 MCG/2ML IJ SOLN
INTRAMUSCULAR | Status: DC | PRN
  Administered 2016-06-10: 50 ug via INTRAVENOUS
  Administered 2016-06-10: 100 ug via INTRAVENOUS

## 2016-06-10 MED ORDER — CHLORHEXIDINE GLUCONATE CLOTH 2 % EX PADS: 6.0000 | MEDICATED_PAD | Freq: Once | CUTANEOUS | Status: DC

## 2016-06-10 MED ORDER — HYDROCODONE-ACETAMINOPHEN 5-325 MG PO TABS
1.0000 | ORAL_TABLET | Freq: Four times a day (QID) | ORAL | Status: DC | PRN
  Administered 2016-06-10: 1 via ORAL

## 2016-06-10 MED ORDER — HYDROCODONE-ACETAMINOPHEN 5-325 MG PO TABS: 1.0000 | ORAL_TABLET | Freq: Four times a day (QID) | ORAL | 0 refills | Status: DC | PRN

## 2016-06-10 MED ORDER — FENTANYL CITRATE (PF) 100 MCG/2ML IJ SOLN
25.0000 ug | INTRAMUSCULAR | Status: DC | PRN
  Administered 2016-06-10 (×4): 25 ug via INTRAVENOUS

## 2016-06-10 MED ORDER — SUCCINYLCHOLINE CHLORIDE 20 MG/ML IJ SOLN
INTRAMUSCULAR | Status: DC | PRN
Start: 2016-06-10 — End: 2016-06-10
  Administered 2016-06-10: 100 mg via INTRAVENOUS

## 2016-06-10 MED ORDER — DEXTROSE 50 % IV SOLN
6.0000 g | Freq: Once | INTRAVENOUS | Status: AC
  Administered 2016-06-10: 6 g via INTRAVENOUS

## 2016-06-10 MED ORDER — FAMOTIDINE 20 MG PO TABS
ORAL_TABLET | ORAL | Status: AC
Start: 2016-06-10 — End: 2016-06-10
  Filled 2016-06-10: qty 1

## 2016-06-10 MED ORDER — ONDANSETRON HCL 4 MG/2ML IJ SOLN: 4.0000 mg | Freq: Once | INTRAMUSCULAR | Status: DC | PRN

## 2016-06-10 MED ORDER — ROCURONIUM BROMIDE 100 MG/10ML IV SOLN
INTRAVENOUS | Status: DC | PRN
  Administered 2016-06-10 (×2): 10 mg via INTRAVENOUS

## 2016-06-10 MED ORDER — LIDOCAINE 2% (20 MG/ML) 5 ML SYRINGE
INTRAMUSCULAR | Status: DC | PRN
  Administered 2016-06-10: 100 mg via INTRAVENOUS

## 2016-06-10 MED ORDER — FAMOTIDINE 20 MG PO TABS
20.0000 mg | ORAL_TABLET | Freq: Once | ORAL | Status: AC
  Administered 2016-06-10: 20 mg via ORAL

## 2016-06-10 MED ORDER — DEXAMETHASONE SODIUM PHOSPHATE 10 MG/ML IJ SOLN
INTRAMUSCULAR | Status: DC | PRN
Start: 2016-06-10 — End: 2016-06-10
  Administered 2016-06-10: 10 mg via INTRAVENOUS

## 2016-06-10 MED ORDER — ONDANSETRON HCL 4 MG/2ML IJ SOLN
INTRAMUSCULAR | Status: AC
Start: 2016-06-10 — End: 2016-06-10
  Filled 2016-06-10: qty 2

## 2016-06-10 MED ORDER — DEXTROSE 50 % IV SOLN
25.0000 mL | Freq: Once | INTRAVENOUS | Status: AC
  Administered 2016-06-10: 25 mL via INTRAVENOUS

## 2016-06-10 MED ORDER — ONDANSETRON HCL 4 MG/2ML IJ SOLN
INTRAMUSCULAR | Status: DC | PRN
  Administered 2016-06-10: 4 mg via INTRAVENOUS

## 2016-06-10 MED ORDER — FENTANYL CITRATE (PF) 100 MCG/2ML IJ SOLN
INTRAMUSCULAR | Status: AC
  Filled 2016-06-10: qty 2

## 2016-06-10 MED ORDER — PROPOFOL 10 MG/ML IV BOLUS
INTRAVENOUS | Status: DC | PRN
  Administered 2016-06-10: 150 mg via INTRAVENOUS

## 2016-06-10 MED ORDER — PROPOFOL 10 MG/ML IV BOLUS
INTRAVENOUS | Status: AC
  Filled 2016-06-10: qty 20

## 2016-06-10 MED ORDER — FENTANYL CITRATE (PF) 250 MCG/5ML IJ SOLN
INTRAMUSCULAR | Status: AC
  Filled 2016-06-10: qty 5

## 2016-06-10 SURGICAL SUPPLY — 32 items
ADAPTER BETA CAP QUINTON DIALY (ADAPTER) IMPLANT
ADAPTER CATH DIALYSIS 18.75 (CATHETERS) ×2 IMPLANT
ADAPTER CATH DIALYSIS 18.75CM (CATHETERS) ×1
CANISTER SUCT 1200ML W/VALVE (MISCELLANEOUS) ×3 IMPLANT
CATH DLYS SWAN NECK 62.5CM (CATHETERS) ×3 IMPLANT
CHLORAPREP W/TINT 26ML (MISCELLANEOUS) ×3 IMPLANT
DERMABOND ADVANCED (GAUZE/BANDAGES/DRESSINGS) ×2
DERMABOND ADVANCED .7 DNX12 (GAUZE/BANDAGES/DRESSINGS) ×1 IMPLANT
ELECT CAUTERY BLADE 6.4 (BLADE) ×3 IMPLANT
ELECT REM PT RETURN 9FT ADLT (ELECTROSURGICAL) ×3
ELECTRODE REM PT RTRN 9FT ADLT (ELECTROSURGICAL) ×1 IMPLANT
GLOVE BIO SURGEON STRL SZ7 (GLOVE) ×6 IMPLANT
GLOVE INDICATOR 7.5 STRL GRN (GLOVE) ×3 IMPLANT
GOWN STRL REUS W/ TWL LRG LVL3 (GOWN DISPOSABLE) ×2 IMPLANT
GOWN STRL REUS W/ TWL XL LVL3 (GOWN DISPOSABLE) ×1 IMPLANT
GOWN STRL REUS W/TWL LRG LVL3 (GOWN DISPOSABLE) ×4
GOWN STRL REUS W/TWL XL LVL3 (GOWN DISPOSABLE) ×2
IV NS 500ML (IV SOLUTION) ×2
IV NS 500ML BAXH (IV SOLUTION) ×1 IMPLANT
KIT RM TURNOVER STRD PROC AR (KITS) ×3 IMPLANT
LABEL OR SOLS (LABEL) IMPLANT
MINICAP W/POVIDONE IODINE SOL (MISCELLANEOUS) ×3 IMPLANT
PACK LAP CHOLECYSTECTOMY (MISCELLANEOUS) ×3 IMPLANT
PENCIL ELECTRO HAND CTR (MISCELLANEOUS) ×3 IMPLANT
SET CYSTO W/LG BORE CLAMP LF (SET/KITS/TRAYS/PACK) ×3 IMPLANT
SET TRANSFER 6 W/TWIST CLAMP 5 (SET/KITS/TRAYS/PACK) ×3 IMPLANT
SPONGE VERSALON 4X4 4PLY (MISCELLANEOUS) ×3 IMPLANT
SUT MNCRL AB 4-0 PS2 18 (SUTURE) ×3 IMPLANT
SUT VIC AB 2-0 UR6 27 (SUTURE) ×3 IMPLANT
SUT VICRYL+ 3-0 36IN CT-1 (SUTURE) ×3 IMPLANT
TROCAR XCEL NON-BLD 11X100MML (ENDOMECHANICALS) ×3 IMPLANT
TUBING INSUFFLATOR HI FLOW (MISCELLANEOUS) ×3 IMPLANT

## 2016-06-10 NOTE — Anesthesia Postprocedure Evaluation (Signed)
Anesthesia Post Note  Patient: Jacqueline Rodriguez  Procedure(s) Performed: Procedure(s) (LRB): LAPAROSCOPIC INSERTION CONTINUOUS AMBULATORY PERITONEAL DIALYSIS  (CAPD) CATHETER (N/A)  Patient location during evaluation: PACU Anesthesia Type: General Level of consciousness: awake and alert and oriented Pain management: pain level controlled Vital Signs Assessment: post-procedure vital signs reviewed and stable Respiratory status: spontaneous breathing Cardiovascular status: blood pressure returned to baseline Anesthetic complications: no     Last Vitals:  Vitals:   06/10/16 1450 06/10/16 1505  BP: (!) 157/71 (!) 165/75  Pulse: 77 76  Resp: 14 (!) 29  Temp:      Last Pain:  Vitals:   06/10/16 1505  TempSrc:   PainSc: 2                  Emerly Prak

## 2016-06-10 NOTE — Anesthesia Preprocedure Evaluation (Addendum)
Anesthesia Evaluation  Patient identified by MRN, date of birth, ID band Patient awake    Reviewed: Allergy & Precautions, NPO status , Patient's Chart, lab work & pertinent test results, reviewed documented beta blocker date and time   History of Anesthesia Complications Negative for: history of anesthetic complications  Airway Mallampati: III  TM Distance: <3 FB Neck ROM: Full    Dental  (+) Poor Dentition   Pulmonary shortness of breath and with exertion, COPD,  COPD inhaler, former smoker,    breath sounds clear to auscultation- rhonchi (-) wheezing      Cardiovascular hypertension, Pt. on medications and Pt. on home beta blockers + Peripheral Vascular Disease, +CHF (preserved EF) and + Orthopnea  (-) CAD and (-) Past MI  Rhythm:Regular Rate:Normal - Systolic murmurs and - Diastolic murmurs Echo 03/11/52: - Left ventricle: The cavity size was normal. Wall thickness was   increased in a pattern of moderate LVH. Systolic function was   normal. The estimated ejection fraction was in the range of 55%   to 60%. Wall motion was normal; there were no regional wall   motion abnormalities. - Mitral valve: There was mild regurgitation. - Left atrium: The atrium was mildly dilated. - Right ventricle: The cavity size was mildly dilated. - Right atrium: The atrium was mildly dilated. - Tricuspid valve: There was moderate regurgitation.   Neuro/Psych negative neurological ROS  negative psych ROS   GI/Hepatic negative GI ROS, Neg liver ROS, GERD  Medicated,  Endo/Other  diabetes, Type 2, Oral Hypoglycemic Agents  Renal/GU ESRF and DialysisRenal disease     Musculoskeletal negative musculoskeletal ROS (+) Arthritis , Osteoarthritis,    Abdominal (+) + obese,   Peds  Hematology  (+) anemia ,   Anesthesia Other Findings Past Medical History: No date: Anemia No date: Chronic bronchitis (HCC) No date: Chronic diastolic CHF  (congestive heart failur* No date: CKD (chronic kidney disease), stage III No date: COPD (chronic obstructive pulmonary disease) (* No date: Diabetes mellitus with renal complications (HC* No date: Essential hypertension No date: GERD (gastroesophageal reflux disease) No date: Obesity No date: Peripheral vascular disease (HCC) No date: Pulmonary hypertension ([facility]) 09/19/2015: Renal insufficiency     Comment: Stage 3 CKD. Beginning dialysis.  Reproductive/Obstetrics                            Anesthesia Physical  Anesthesia Plan  ASA: IV  Anesthesia Plan: General   Post-op Pain Management:  Regional for Post-op pain   Induction:   Airway Management Planned: Oral ETT  Additional Equipment:   Intra-op Plan:   Post-operative Plan: Extubation in OR  Informed Consent: I have reviewed the patients History and Physical, chart, labs and discussed the procedure including the risks, benefits and alternatives for the proposed anesthesia with the patient or authorized representative who has indicated his/her understanding and acceptance.   Dental advisory given  Plan Discussed with: CRNA, Anesthesiologist and Surgeon  Anesthesia Plan Comments: (Have glidescope available)       Anesthesia Quick Evaluation

## 2016-06-10 NOTE — Anesthesia Procedure Notes (Signed)
Procedure Name: Intubation Date/Time: 06/10/2016 1:57 PM Performed by: Marsh Dolly Pre-anesthesia Checklist: Patient identified, Patient being monitored, Timeout performed, Emergency Drugs available and Suction available Patient Re-evaluated:Patient Re-evaluated prior to inductionOxygen Delivery Method: Circle system utilized Preoxygenation: Pre-oxygenation with 100% oxygen Intubation Type: IV induction Ventilation: Mask ventilation without difficulty Laryngoscope Size: 3 and Miller Grade View: Grade I Tube type: Oral Tube size: 7.0 mm Number of attempts: 1 Airway Equipment and Method: Stylet Placement Confirmation: ETT inserted through vocal cords under direct vision,  positive ETCO2 and breath sounds checked- equal and bilateral Secured at: 21 cm Tube secured with: Tape Dental Injury: Teeth and Oropharynx as per pre-operative assessment

## 2016-06-10 NOTE — H&P (Signed)
[facility] History & Physical Update  The patient was interviewed and re-examined.  The patient's previous History and Physical has been reviewed and is unchanged.  There is no change in the plan of care. We plan to proceed with the scheduled procedure.  Leotis Pain, MD  06/10/2016, 1:30 PM

## 2016-06-10 NOTE — OR Nursing (Signed)
Dr Kayleen Memos notified with FSBS of 63 and potassium of 4.9.  New order received.  Dr Kayleen Memos also made aware that patient ate a piece of heard candy at 10:00 this am.

## 2016-06-10 NOTE — Anesthesia Post-op Follow-up Note (Cosign Needed)
Anesthesia QCDR form completed.        

## 2016-06-10 NOTE — Transfer of Care (Signed)
Immediate Anesthesia Transfer of Care Note  Patient: Jacqueline Rodriguez  Procedure(s) Performed: Procedure(s): LAPAROSCOPIC INSERTION CONTINUOUS AMBULATORY PERITONEAL DIALYSIS  (CAPD) CATHETER (N/A)  Patient Location: PACU  Anesthesia Type:General  Level of Consciousness: awake, alert  and oriented  Airway & Oxygen Therapy: Patient Spontanous Breathing and Patient connected to face mask oxygen  Post-op Assessment: Report given to RN and Post -op Vital signs reviewed and stable  Post vital signs: Reviewed and stable  Last Vitals:  Vitals:   06/10/16 1105  BP: (!) 175/71  Pulse: 67  Resp: 16  Temp: (!) 35.7 C    Last Pain:  Vitals:   06/10/16 1105  TempSrc: Tympanic         Complications: No apparent anesthesia complications

## 2016-06-10 NOTE — Discharge Instructions (Signed)

## 2016-06-10 NOTE — Op Note (Signed)
  OPERATIVE NOTE   PROCEDURE: 1. Laparoscopic peritoneal dialysis catheter placement.  PRE-OPERATIVE DIAGNOSIS: 1. ESRD   POST-OPERATIVE DIAGNOSIS: Same  SURGEON: Leotis Pain, MD  ASSISTANT(S): None  ANESTHESIA: general  ESTIMATED BLOOD LOSS: Minimal   FINDING(S): 1. None  SPECIMEN(S): None  INDICATIONS:  Patient presents with ESRD and decides to switch from hemodialysis to peritoneal dialysis. The patient has decided to do peritoneal dialysis for his long-term dialysis. Risks and benefits of placement were discussed and he is agreeable to proceed.  Differences between peritoneal dialysis and hemodialysis were discussed.    DESCRIPTION: After obtaining full informed written consent, the patient was brought back to the operating room and placed supine upon the operating table. The patient received IV antibiotics prior to induction. After obtaining adequate anesthesia, the abdomen was prepped and draped in the standard fashion. A small transverse incision was created just to the left of the umbilicus and we dissected down to the fascia and placed a pursestring Vicryl suture. I then entered the peritoneum with an 2mm Optiview trocar placed in the right upper quadrant and insufflated the abdomen with carbon dioxide. I then entered the peritoneum just beside the umbilicus with a trocar and the peritoneal dialysis catheter under direct visualization. The coiled portion of the catheter was parked into the pelvis under direct laparoscopic guidance. The deep cuff was secured to the fascial pursestring suture. A small counterincision was made in the left abdomen and the catheter was brought out this site. The appropriate distal connectors were placed, and I then placed 500 cc of saline through the catheter into the pelvis. The abdomen was desufflated. Immediately, 450 cc of effluent returned through the catheter when the bag was placed to gravity. I took one more look with the camera to ensure  that the catheter was in the pelvis and it was. The 51mm trocar was then removed. I then closed the incisions with 3-0 Vicryl and 4-0 Monocryl and placed Dermabond as dressing. Dry dressing was placed around the catheter exit site. The patient was then awakened from anesthesia and taken to the recovery room in stable condition having tolerated the procedure well.  COMPLICATIONS: None  CONDITION: None  Leotis Pain, MD 06/10/2016 2:32 PM   This note was created with Dragon Medical transcription system. Any errors in dictation are purely unintentional.

## 2016-06-14 DIAGNOSIS — D631 Anemia in chronic kidney disease: Secondary | ICD-10-CM | POA: Diagnosis not present

## 2016-06-14 DIAGNOSIS — N186 End stage renal disease: Secondary | ICD-10-CM | POA: Diagnosis not present

## 2016-06-14 DIAGNOSIS — N2581 Secondary hyperparathyroidism of renal origin: Secondary | ICD-10-CM | POA: Diagnosis not present

## 2016-06-14 DIAGNOSIS — D509 Iron deficiency anemia, unspecified: Secondary | ICD-10-CM | POA: Diagnosis not present

## 2016-06-14 DIAGNOSIS — Z992 Dependence on renal dialysis: Secondary | ICD-10-CM | POA: Diagnosis not present

## 2016-06-14 DIAGNOSIS — Z23 Encounter for immunization: Secondary | ICD-10-CM | POA: Diagnosis not present

## 2016-06-21 DIAGNOSIS — D509 Iron deficiency anemia, unspecified: Secondary | ICD-10-CM | POA: Diagnosis not present

## 2016-06-21 DIAGNOSIS — D631 Anemia in chronic kidney disease: Secondary | ICD-10-CM | POA: Diagnosis not present

## 2016-06-21 DIAGNOSIS — N2581 Secondary hyperparathyroidism of renal origin: Secondary | ICD-10-CM | POA: Diagnosis not present

## 2016-06-21 DIAGNOSIS — Z23 Encounter for immunization: Secondary | ICD-10-CM | POA: Diagnosis not present

## 2016-06-21 DIAGNOSIS — N186 End stage renal disease: Secondary | ICD-10-CM | POA: Diagnosis not present

## 2016-06-21 DIAGNOSIS — Z992 Dependence on renal dialysis: Secondary | ICD-10-CM | POA: Diagnosis not present

## 2016-06-23 DIAGNOSIS — D509 Iron deficiency anemia, unspecified: Secondary | ICD-10-CM | POA: Diagnosis not present

## 2016-06-23 DIAGNOSIS — Z23 Encounter for immunization: Secondary | ICD-10-CM | POA: Diagnosis not present

## 2016-06-23 DIAGNOSIS — D631 Anemia in chronic kidney disease: Secondary | ICD-10-CM | POA: Diagnosis not present

## 2016-06-23 DIAGNOSIS — N186 End stage renal disease: Secondary | ICD-10-CM | POA: Diagnosis not present

## 2016-06-23 DIAGNOSIS — Z992 Dependence on renal dialysis: Secondary | ICD-10-CM | POA: Diagnosis not present

## 2016-06-23 DIAGNOSIS — N2581 Secondary hyperparathyroidism of renal origin: Secondary | ICD-10-CM | POA: Diagnosis not present

## 2016-06-24 ENCOUNTER — Encounter (INDEPENDENT_AMBULATORY_CARE_PROVIDER_SITE_OTHER): Payer: Medicare Other | Admitting: Vascular Surgery

## 2016-06-28 DIAGNOSIS — N2581 Secondary hyperparathyroidism of renal origin: Secondary | ICD-10-CM | POA: Diagnosis not present

## 2016-06-28 DIAGNOSIS — N186 End stage renal disease: Secondary | ICD-10-CM | POA: Diagnosis not present

## 2016-06-28 DIAGNOSIS — D631 Anemia in chronic kidney disease: Secondary | ICD-10-CM | POA: Diagnosis not present

## 2016-06-28 DIAGNOSIS — Z992 Dependence on renal dialysis: Secondary | ICD-10-CM | POA: Diagnosis not present

## 2016-06-28 DIAGNOSIS — Z23 Encounter for immunization: Secondary | ICD-10-CM | POA: Diagnosis not present

## 2016-06-28 DIAGNOSIS — D509 Iron deficiency anemia, unspecified: Secondary | ICD-10-CM | POA: Diagnosis not present

## 2016-07-01 ENCOUNTER — Encounter (INDEPENDENT_AMBULATORY_CARE_PROVIDER_SITE_OTHER): Payer: Medicare Other | Admitting: Vascular Surgery

## 2016-07-02 DIAGNOSIS — Z992 Dependence on renal dialysis: Secondary | ICD-10-CM | POA: Diagnosis not present

## 2016-07-02 DIAGNOSIS — N186 End stage renal disease: Secondary | ICD-10-CM | POA: Diagnosis not present

## 2016-07-02 DIAGNOSIS — Z23 Encounter for immunization: Secondary | ICD-10-CM | POA: Diagnosis not present

## 2016-07-02 DIAGNOSIS — D509 Iron deficiency anemia, unspecified: Secondary | ICD-10-CM | POA: Diagnosis not present

## 2016-07-02 DIAGNOSIS — D631 Anemia in chronic kidney disease: Secondary | ICD-10-CM | POA: Diagnosis not present

## 2016-07-02 DIAGNOSIS — N2581 Secondary hyperparathyroidism of renal origin: Secondary | ICD-10-CM | POA: Diagnosis not present

## 2016-07-05 DIAGNOSIS — N186 End stage renal disease: Secondary | ICD-10-CM | POA: Diagnosis not present

## 2016-07-05 DIAGNOSIS — D631 Anemia in chronic kidney disease: Secondary | ICD-10-CM | POA: Diagnosis not present

## 2016-07-05 DIAGNOSIS — N2581 Secondary hyperparathyroidism of renal origin: Secondary | ICD-10-CM | POA: Diagnosis not present

## 2016-07-05 DIAGNOSIS — Z992 Dependence on renal dialysis: Secondary | ICD-10-CM | POA: Diagnosis not present

## 2016-07-05 DIAGNOSIS — D509 Iron deficiency anemia, unspecified: Secondary | ICD-10-CM | POA: Diagnosis not present

## 2016-07-05 DIAGNOSIS — Z23 Encounter for immunization: Secondary | ICD-10-CM | POA: Diagnosis not present

## 2016-07-07 DIAGNOSIS — Z23 Encounter for immunization: Secondary | ICD-10-CM | POA: Diagnosis not present

## 2016-07-07 DIAGNOSIS — Z992 Dependence on renal dialysis: Secondary | ICD-10-CM | POA: Diagnosis not present

## 2016-07-07 DIAGNOSIS — N2581 Secondary hyperparathyroidism of renal origin: Secondary | ICD-10-CM | POA: Diagnosis not present

## 2016-07-07 DIAGNOSIS — D509 Iron deficiency anemia, unspecified: Secondary | ICD-10-CM | POA: Diagnosis not present

## 2016-07-07 DIAGNOSIS — N186 End stage renal disease: Secondary | ICD-10-CM | POA: Diagnosis not present

## 2016-07-07 DIAGNOSIS — D631 Anemia in chronic kidney disease: Secondary | ICD-10-CM | POA: Diagnosis not present

## 2016-07-10 DIAGNOSIS — N186 End stage renal disease: Secondary | ICD-10-CM | POA: Diagnosis not present

## 2016-07-10 DIAGNOSIS — Z992 Dependence on renal dialysis: Secondary | ICD-10-CM | POA: Diagnosis not present

## 2016-07-12 DIAGNOSIS — Z992 Dependence on renal dialysis: Secondary | ICD-10-CM | POA: Diagnosis not present

## 2016-07-12 DIAGNOSIS — N2581 Secondary hyperparathyroidism of renal origin: Secondary | ICD-10-CM | POA: Diagnosis not present

## 2016-07-12 DIAGNOSIS — D509 Iron deficiency anemia, unspecified: Secondary | ICD-10-CM | POA: Diagnosis not present

## 2016-07-12 DIAGNOSIS — D631 Anemia in chronic kidney disease: Secondary | ICD-10-CM | POA: Diagnosis not present

## 2016-07-12 DIAGNOSIS — N186 End stage renal disease: Secondary | ICD-10-CM | POA: Diagnosis not present

## 2016-07-12 DIAGNOSIS — Z23 Encounter for immunization: Secondary | ICD-10-CM | POA: Diagnosis not present

## 2016-07-12 DIAGNOSIS — T85898A Other specified complication of other internal prosthetic devices, implants and grafts, initial encounter: Secondary | ICD-10-CM | POA: Diagnosis not present

## 2016-07-14 DIAGNOSIS — N186 End stage renal disease: Secondary | ICD-10-CM | POA: Diagnosis not present

## 2016-07-14 DIAGNOSIS — N2581 Secondary hyperparathyroidism of renal origin: Secondary | ICD-10-CM | POA: Diagnosis not present

## 2016-07-14 DIAGNOSIS — D631 Anemia in chronic kidney disease: Secondary | ICD-10-CM | POA: Diagnosis not present

## 2016-07-14 DIAGNOSIS — D509 Iron deficiency anemia, unspecified: Secondary | ICD-10-CM | POA: Diagnosis not present

## 2016-07-14 DIAGNOSIS — Z23 Encounter for immunization: Secondary | ICD-10-CM | POA: Diagnosis not present

## 2016-07-14 DIAGNOSIS — T85898A Other specified complication of other internal prosthetic devices, implants and grafts, initial encounter: Secondary | ICD-10-CM | POA: Diagnosis not present

## 2016-07-19 DIAGNOSIS — D509 Iron deficiency anemia, unspecified: Secondary | ICD-10-CM | POA: Diagnosis not present

## 2016-07-19 DIAGNOSIS — T85898A Other specified complication of other internal prosthetic devices, implants and grafts, initial encounter: Secondary | ICD-10-CM | POA: Diagnosis not present

## 2016-07-19 DIAGNOSIS — D631 Anemia in chronic kidney disease: Secondary | ICD-10-CM | POA: Diagnosis not present

## 2016-07-19 DIAGNOSIS — N2581 Secondary hyperparathyroidism of renal origin: Secondary | ICD-10-CM | POA: Diagnosis not present

## 2016-07-19 DIAGNOSIS — Z23 Encounter for immunization: Secondary | ICD-10-CM | POA: Diagnosis not present

## 2016-07-19 DIAGNOSIS — N186 End stage renal disease: Secondary | ICD-10-CM | POA: Diagnosis not present

## 2016-07-23 DIAGNOSIS — Z23 Encounter for immunization: Secondary | ICD-10-CM | POA: Diagnosis not present

## 2016-07-23 DIAGNOSIS — D509 Iron deficiency anemia, unspecified: Secondary | ICD-10-CM | POA: Diagnosis not present

## 2016-07-23 DIAGNOSIS — T85898A Other specified complication of other internal prosthetic devices, implants and grafts, initial encounter: Secondary | ICD-10-CM | POA: Diagnosis not present

## 2016-07-23 DIAGNOSIS — D631 Anemia in chronic kidney disease: Secondary | ICD-10-CM | POA: Diagnosis not present

## 2016-07-23 DIAGNOSIS — N2581 Secondary hyperparathyroidism of renal origin: Secondary | ICD-10-CM | POA: Diagnosis not present

## 2016-07-23 DIAGNOSIS — N186 End stage renal disease: Secondary | ICD-10-CM | POA: Diagnosis not present

## 2016-07-29 ENCOUNTER — Ambulatory Visit: Payer: Medicare Other | Admitting: Family

## 2016-07-30 DIAGNOSIS — T85898A Other specified complication of other internal prosthetic devices, implants and grafts, initial encounter: Secondary | ICD-10-CM | POA: Diagnosis not present

## 2016-07-30 DIAGNOSIS — N2581 Secondary hyperparathyroidism of renal origin: Secondary | ICD-10-CM | POA: Diagnosis not present

## 2016-07-30 DIAGNOSIS — D631 Anemia in chronic kidney disease: Secondary | ICD-10-CM | POA: Diagnosis not present

## 2016-07-30 DIAGNOSIS — Z23 Encounter for immunization: Secondary | ICD-10-CM | POA: Diagnosis not present

## 2016-07-30 DIAGNOSIS — N186 End stage renal disease: Secondary | ICD-10-CM | POA: Diagnosis not present

## 2016-07-30 DIAGNOSIS — D509 Iron deficiency anemia, unspecified: Secondary | ICD-10-CM | POA: Diagnosis not present

## 2016-08-02 DIAGNOSIS — D509 Iron deficiency anemia, unspecified: Secondary | ICD-10-CM | POA: Diagnosis not present

## 2016-08-02 DIAGNOSIS — T85898A Other specified complication of other internal prosthetic devices, implants and grafts, initial encounter: Secondary | ICD-10-CM | POA: Diagnosis not present

## 2016-08-02 DIAGNOSIS — N2581 Secondary hyperparathyroidism of renal origin: Secondary | ICD-10-CM | POA: Diagnosis not present

## 2016-08-02 DIAGNOSIS — E119 Type 2 diabetes mellitus without complications: Secondary | ICD-10-CM | POA: Diagnosis not present

## 2016-08-02 DIAGNOSIS — Z23 Encounter for immunization: Secondary | ICD-10-CM | POA: Diagnosis not present

## 2016-08-02 DIAGNOSIS — Z992 Dependence on renal dialysis: Secondary | ICD-10-CM | POA: Diagnosis not present

## 2016-08-02 DIAGNOSIS — D631 Anemia in chronic kidney disease: Secondary | ICD-10-CM | POA: Diagnosis not present

## 2016-08-02 DIAGNOSIS — N186 End stage renal disease: Secondary | ICD-10-CM | POA: Diagnosis not present

## 2016-08-03 DIAGNOSIS — T85898A Other specified complication of other internal prosthetic devices, implants and grafts, initial encounter: Secondary | ICD-10-CM | POA: Diagnosis not present

## 2016-08-03 DIAGNOSIS — Z23 Encounter for immunization: Secondary | ICD-10-CM | POA: Diagnosis not present

## 2016-08-03 DIAGNOSIS — N186 End stage renal disease: Secondary | ICD-10-CM | POA: Diagnosis not present

## 2016-08-03 DIAGNOSIS — D631 Anemia in chronic kidney disease: Secondary | ICD-10-CM | POA: Diagnosis not present

## 2016-08-03 DIAGNOSIS — D509 Iron deficiency anemia, unspecified: Secondary | ICD-10-CM | POA: Diagnosis not present

## 2016-08-03 DIAGNOSIS — N2581 Secondary hyperparathyroidism of renal origin: Secondary | ICD-10-CM | POA: Diagnosis not present

## 2016-08-04 DIAGNOSIS — D631 Anemia in chronic kidney disease: Secondary | ICD-10-CM | POA: Diagnosis not present

## 2016-08-04 DIAGNOSIS — T85898A Other specified complication of other internal prosthetic devices, implants and grafts, initial encounter: Secondary | ICD-10-CM | POA: Diagnosis not present

## 2016-08-04 DIAGNOSIS — D509 Iron deficiency anemia, unspecified: Secondary | ICD-10-CM | POA: Diagnosis not present

## 2016-08-04 DIAGNOSIS — Z23 Encounter for immunization: Secondary | ICD-10-CM | POA: Diagnosis not present

## 2016-08-04 DIAGNOSIS — N2581 Secondary hyperparathyroidism of renal origin: Secondary | ICD-10-CM | POA: Diagnosis not present

## 2016-08-04 DIAGNOSIS — N186 End stage renal disease: Secondary | ICD-10-CM | POA: Diagnosis not present

## 2016-08-06 DIAGNOSIS — N186 End stage renal disease: Secondary | ICD-10-CM | POA: Diagnosis not present

## 2016-08-06 DIAGNOSIS — D631 Anemia in chronic kidney disease: Secondary | ICD-10-CM | POA: Diagnosis not present

## 2016-08-06 DIAGNOSIS — Z23 Encounter for immunization: Secondary | ICD-10-CM | POA: Diagnosis not present

## 2016-08-06 DIAGNOSIS — T85898A Other specified complication of other internal prosthetic devices, implants and grafts, initial encounter: Secondary | ICD-10-CM | POA: Diagnosis not present

## 2016-08-06 DIAGNOSIS — N2581 Secondary hyperparathyroidism of renal origin: Secondary | ICD-10-CM | POA: Diagnosis not present

## 2016-08-06 DIAGNOSIS — D509 Iron deficiency anemia, unspecified: Secondary | ICD-10-CM | POA: Diagnosis not present

## 2016-08-09 DIAGNOSIS — D509 Iron deficiency anemia, unspecified: Secondary | ICD-10-CM | POA: Diagnosis not present

## 2016-08-09 DIAGNOSIS — N2581 Secondary hyperparathyroidism of renal origin: Secondary | ICD-10-CM | POA: Diagnosis not present

## 2016-08-09 DIAGNOSIS — Z23 Encounter for immunization: Secondary | ICD-10-CM | POA: Diagnosis not present

## 2016-08-09 DIAGNOSIS — T85898A Other specified complication of other internal prosthetic devices, implants and grafts, initial encounter: Secondary | ICD-10-CM | POA: Diagnosis not present

## 2016-08-09 DIAGNOSIS — N186 End stage renal disease: Secondary | ICD-10-CM | POA: Diagnosis not present

## 2016-08-09 DIAGNOSIS — D631 Anemia in chronic kidney disease: Secondary | ICD-10-CM | POA: Diagnosis not present

## 2016-08-10 ENCOUNTER — Ambulatory Visit: Payer: Medicare Other | Admitting: Cardiovascular Disease

## 2016-08-10 DIAGNOSIS — N186 End stage renal disease: Secondary | ICD-10-CM | POA: Diagnosis not present

## 2016-08-10 DIAGNOSIS — Z992 Dependence on renal dialysis: Secondary | ICD-10-CM | POA: Diagnosis not present

## 2016-08-16 DIAGNOSIS — N186 End stage renal disease: Secondary | ICD-10-CM | POA: Diagnosis not present

## 2016-08-16 DIAGNOSIS — D631 Anemia in chronic kidney disease: Secondary | ICD-10-CM | POA: Diagnosis not present

## 2016-08-16 DIAGNOSIS — D509 Iron deficiency anemia, unspecified: Secondary | ICD-10-CM | POA: Diagnosis not present

## 2016-08-16 DIAGNOSIS — Z992 Dependence on renal dialysis: Secondary | ICD-10-CM | POA: Diagnosis not present

## 2016-08-20 DIAGNOSIS — D509 Iron deficiency anemia, unspecified: Secondary | ICD-10-CM | POA: Diagnosis not present

## 2016-08-20 DIAGNOSIS — Z992 Dependence on renal dialysis: Secondary | ICD-10-CM | POA: Diagnosis not present

## 2016-08-20 DIAGNOSIS — D631 Anemia in chronic kidney disease: Secondary | ICD-10-CM | POA: Diagnosis not present

## 2016-08-20 DIAGNOSIS — N186 End stage renal disease: Secondary | ICD-10-CM | POA: Diagnosis not present

## 2016-08-27 DIAGNOSIS — D631 Anemia in chronic kidney disease: Secondary | ICD-10-CM | POA: Diagnosis not present

## 2016-08-27 DIAGNOSIS — D509 Iron deficiency anemia, unspecified: Secondary | ICD-10-CM | POA: Diagnosis not present

## 2016-08-27 DIAGNOSIS — Z992 Dependence on renal dialysis: Secondary | ICD-10-CM | POA: Diagnosis not present

## 2016-08-27 DIAGNOSIS — N186 End stage renal disease: Secondary | ICD-10-CM | POA: Diagnosis not present

## 2016-09-01 DIAGNOSIS — Z992 Dependence on renal dialysis: Secondary | ICD-10-CM | POA: Diagnosis not present

## 2016-09-01 DIAGNOSIS — D509 Iron deficiency anemia, unspecified: Secondary | ICD-10-CM | POA: Diagnosis not present

## 2016-09-01 DIAGNOSIS — D631 Anemia in chronic kidney disease: Secondary | ICD-10-CM | POA: Diagnosis not present

## 2016-09-01 DIAGNOSIS — N186 End stage renal disease: Secondary | ICD-10-CM | POA: Diagnosis not present

## 2016-09-03 DIAGNOSIS — Z992 Dependence on renal dialysis: Secondary | ICD-10-CM | POA: Diagnosis not present

## 2016-09-03 DIAGNOSIS — N186 End stage renal disease: Secondary | ICD-10-CM | POA: Diagnosis not present

## 2016-09-03 DIAGNOSIS — A311 Cutaneous mycobacterial infection: Secondary | ICD-10-CM | POA: Diagnosis not present

## 2016-09-03 DIAGNOSIS — T8571XA Infection and inflammatory reaction due to peritoneal dialysis catheter, initial encounter: Secondary | ICD-10-CM | POA: Diagnosis not present

## 2016-09-07 DIAGNOSIS — D631 Anemia in chronic kidney disease: Secondary | ICD-10-CM | POA: Diagnosis not present

## 2016-09-07 DIAGNOSIS — N186 End stage renal disease: Secondary | ICD-10-CM | POA: Diagnosis not present

## 2016-09-07 DIAGNOSIS — Z992 Dependence on renal dialysis: Secondary | ICD-10-CM | POA: Diagnosis not present

## 2016-09-07 DIAGNOSIS — D509 Iron deficiency anemia, unspecified: Secondary | ICD-10-CM | POA: Diagnosis not present

## 2016-09-08 ENCOUNTER — Emergency Department: Payer: Medicare Other

## 2016-09-08 ENCOUNTER — Encounter: Payer: Self-pay | Admitting: Emergency Medicine

## 2016-09-08 ENCOUNTER — Observation Stay
Admission: EM | Admit: 2016-09-08 | Discharge: 2016-09-09 | Disposition: A | Discharge status: Home / Self Care | Payer: Medicare Other | Attending: Internal Medicine | Admitting: Internal Medicine

## 2016-09-08 ENCOUNTER — Observation Stay: Payer: Medicare Other

## 2016-09-08 DIAGNOSIS — Z79899 Other long term (current) drug therapy: Secondary | ICD-10-CM | POA: Insufficient documentation

## 2016-09-08 DIAGNOSIS — I447 Left bundle-branch block, unspecified: Secondary | ICD-10-CM | POA: Insufficient documentation

## 2016-09-08 DIAGNOSIS — I132 Hypertensive heart and chronic kidney disease with heart failure and with stage 5 chronic kidney disease, or end stage renal disease: Secondary | ICD-10-CM | POA: Diagnosis not present

## 2016-09-08 DIAGNOSIS — I700 Atherosclerosis of aorta: Secondary | ICD-10-CM | POA: Diagnosis not present

## 2016-09-08 DIAGNOSIS — Z6835 Body mass index (BMI) 35.0-35.9, adult: Secondary | ICD-10-CM | POA: Diagnosis not present

## 2016-09-08 DIAGNOSIS — N3000 Acute cystitis without hematuria: Secondary | ICD-10-CM | POA: Diagnosis not present

## 2016-09-08 DIAGNOSIS — R531 Weakness: Secondary | ICD-10-CM

## 2016-09-08 DIAGNOSIS — Z7982 Long term (current) use of aspirin: Secondary | ICD-10-CM | POA: Insufficient documentation

## 2016-09-08 DIAGNOSIS — K219 Gastro-esophageal reflux disease without esophagitis: Secondary | ICD-10-CM | POA: Diagnosis not present

## 2016-09-08 DIAGNOSIS — Z8249 Family history of ischemic heart disease and other diseases of the circulatory system: Secondary | ICD-10-CM | POA: Insufficient documentation

## 2016-09-08 DIAGNOSIS — E1151 Type 2 diabetes mellitus with diabetic peripheral angiopathy without gangrene: Secondary | ICD-10-CM | POA: Insufficient documentation

## 2016-09-08 DIAGNOSIS — R001 Bradycardia, unspecified: Secondary | ICD-10-CM | POA: Insufficient documentation

## 2016-09-08 DIAGNOSIS — E1165 Type 2 diabetes mellitus with hyperglycemia: Secondary | ICD-10-CM | POA: Insufficient documentation

## 2016-09-08 DIAGNOSIS — I4581 Long QT syndrome: Secondary | ICD-10-CM | POA: Diagnosis not present

## 2016-09-08 DIAGNOSIS — Z7984 Long term (current) use of oral hypoglycemic drugs: Secondary | ICD-10-CM | POA: Insufficient documentation

## 2016-09-08 DIAGNOSIS — I5032 Chronic diastolic (congestive) heart failure: Secondary | ICD-10-CM | POA: Diagnosis not present

## 2016-09-08 DIAGNOSIS — Z992 Dependence on renal dialysis: Secondary | ICD-10-CM | POA: Insufficient documentation

## 2016-09-08 DIAGNOSIS — E871 Hypo-osmolality and hyponatremia: Secondary | ICD-10-CM | POA: Diagnosis not present

## 2016-09-08 DIAGNOSIS — D631 Anemia in chronic kidney disease: Secondary | ICD-10-CM | POA: Insufficient documentation

## 2016-09-08 DIAGNOSIS — E1122 Type 2 diabetes mellitus with diabetic chronic kidney disease: Secondary | ICD-10-CM | POA: Insufficient documentation

## 2016-09-08 DIAGNOSIS — R42 Dizziness and giddiness: Secondary | ICD-10-CM | POA: Insufficient documentation

## 2016-09-08 DIAGNOSIS — Z888 Allergy status to other drugs, medicaments and biological substances status: Secondary | ICD-10-CM | POA: Insufficient documentation

## 2016-09-08 DIAGNOSIS — Z87891 Personal history of nicotine dependence: Secondary | ICD-10-CM | POA: Insufficient documentation

## 2016-09-08 DIAGNOSIS — N186 End stage renal disease: Secondary | ICD-10-CM | POA: Insufficient documentation

## 2016-09-08 DIAGNOSIS — E669 Obesity, unspecified: Secondary | ICD-10-CM | POA: Diagnosis not present

## 2016-09-08 DIAGNOSIS — E1140 Type 2 diabetes mellitus with diabetic neuropathy, unspecified: Secondary | ICD-10-CM | POA: Diagnosis not present

## 2016-09-08 DIAGNOSIS — R200 Anesthesia of skin: Secondary | ICD-10-CM | POA: Insufficient documentation

## 2016-09-08 DIAGNOSIS — I6522 Occlusion and stenosis of left carotid artery: Secondary | ICD-10-CM | POA: Diagnosis not present

## 2016-09-08 DIAGNOSIS — R51 Headache: Secondary | ICD-10-CM

## 2016-09-08 DIAGNOSIS — J449 Chronic obstructive pulmonary disease, unspecified: Secondary | ICD-10-CM | POA: Diagnosis not present

## 2016-09-08 DIAGNOSIS — E11319 Type 2 diabetes mellitus with unspecified diabetic retinopathy without macular edema: Secondary | ICD-10-CM | POA: Diagnosis not present

## 2016-09-08 DIAGNOSIS — R55 Syncope and collapse: Secondary | ICD-10-CM | POA: Diagnosis not present

## 2016-09-08 DIAGNOSIS — R519 Headache, unspecified: Secondary | ICD-10-CM

## 2016-09-08 DIAGNOSIS — E162 Hypoglycemia, unspecified: Secondary | ICD-10-CM

## 2016-09-08 DIAGNOSIS — I272 Pulmonary hypertension, unspecified: Secondary | ICD-10-CM | POA: Insufficient documentation

## 2016-09-08 DIAGNOSIS — I10 Essential (primary) hypertension: Secondary | ICD-10-CM | POA: Diagnosis not present

## 2016-09-08 DIAGNOSIS — I509 Heart failure, unspecified: Secondary | ICD-10-CM | POA: Diagnosis not present

## 2016-09-08 LAB — CBC
HEMATOCRIT: 29.1 % — AB (ref 35.0–47.0)
Hemoglobin: 9.9 g/dL — ABNORMAL LOW (ref 12.0–16.0)
MCH: 32.2 pg (ref 26.0–34.0)
MCHC: 33.9 g/dL (ref 32.0–36.0)
MCV: 95 fL (ref 80.0–100.0)
Platelets: 153 10*3/uL (ref 150–440)
RBC: 3.07 MIL/uL — ABNORMAL LOW (ref 3.80–5.20)
RDW: 16.8 % — AB (ref 11.5–14.5)
WBC: 6.1 10*3/uL (ref 3.6–11.0)

## 2016-09-08 LAB — BASIC METABOLIC PANEL
Anion gap: 13 (ref 5–15)
BUN: 61 mg/dL — AB (ref 6–20)
CO2: 18 mmol/L — ABNORMAL LOW (ref 22–32)
CREATININE: 8.48 mg/dL — AB (ref 0.44–1.00)
Calcium: 7.6 mg/dL — ABNORMAL LOW (ref 8.9–10.3)
Chloride: 98 mmol/L — ABNORMAL LOW (ref 101–111)
GFR calc Af Amer: 5 mL/min — ABNORMAL LOW (ref >60)
GFR, EST NON AFRICAN AMERICAN: 5 mL/min — AB (ref >60)
GLUCOSE: 122 mg/dL — AB (ref 65–99)
POTASSIUM: 5 mmol/L (ref 3.5–5.1)
Sodium: 129 mmol/L — ABNORMAL LOW (ref 135–145)

## 2016-09-08 LAB — URINALYSIS, COMPLETE (UACMP) WITH MICROSCOPIC
BILIRUBIN URINE: NEGATIVE
Glucose, UA: 50 mg/dL — AB
Ketones, ur: NEGATIVE mg/dL
NITRITE: NEGATIVE
PROTEIN: 100 mg/dL — AB
Specific Gravity, Urine: 1.008 (ref 1.005–1.030)
pH: 6 (ref 5.0–8.0)

## 2016-09-08 LAB — GLUCOSE, CAPILLARY
GLUCOSE-CAPILLARY: 117 mg/dL — AB (ref 65–99)
GLUCOSE-CAPILLARY: 94 mg/dL (ref 65–99)
Glucose-Capillary: 104 mg/dL — ABNORMAL HIGH (ref 65–99)
Glucose-Capillary: 101 mg/dL — ABNORMAL HIGH (ref 65–99)
Glucose-Capillary: 98 mg/dL (ref 65–99)
Glucose-Capillary: 124 mg/dL — ABNORMAL HIGH (ref 65–99)

## 2016-09-08 LAB — LIPID PANEL
CHOL/HDL RATIO: 3.3 ratio
Cholesterol: 152 mg/dL (ref 0–200)
HDL: 46 mg/dL (ref >40)
LDL Cholesterol: 89 mg/dL (ref 0–99)
Triglycerides: 85 mg/dL (ref <150)
VLDL: 17 mg/dL (ref 0–40)

## 2016-09-08 LAB — TYPE AND SCREEN
ABO/RH(D): A NEG
Antibody Screen: NEGATIVE

## 2016-09-08 LAB — MAGNESIUM: Magnesium: 1.9 mg/dL (ref 1.7–2.4)

## 2016-09-08 LAB — MRSA PCR SCREENING: MRSA BY PCR: NEGATIVE

## 2016-09-08 MED ORDER — HYDRALAZINE HCL 25 MG PO TABS
25.0000 mg | ORAL_TABLET | Freq: Three times a day (TID) | ORAL | Status: DC
  Filled 2016-09-08: qty 1

## 2016-09-08 MED ORDER — ASPIRIN 81 MG PO CHEW
162.0000 mg | CHEWABLE_TABLET | Freq: Every day | ORAL | Status: DC
  Administered 2016-09-08: 162 mg via ORAL
  Filled 2016-09-08: qty 2

## 2016-09-08 MED ORDER — ALBUTEROL SULFATE (2.5 MG/3ML) 0.083% IN NEBU: 3.0000 mL | INHALATION_SOLUTION | Freq: Four times a day (QID) | RESPIRATORY_TRACT | Status: DC | PRN

## 2016-09-08 MED ORDER — LORAZEPAM 2 MG/ML IJ SOLN
1.0000 mg | Freq: Once | INTRAMUSCULAR | Status: AC
  Administered 2016-09-08: 1 mg via INTRAVENOUS

## 2016-09-08 MED ORDER — LISINOPRIL 10 MG PO TABS
10.0000 mg | ORAL_TABLET | Freq: Every day | ORAL | Status: DC
  Administered 2016-09-09: 10 mg via ORAL
  Filled 2016-09-08: qty 1

## 2016-09-08 MED ORDER — ACETAMINOPHEN 650 MG RE SUPP: 650.0000 mg | RECTAL | Status: DC | PRN

## 2016-09-08 MED ORDER — ACETAMINOPHEN 325 MG PO TABS
650.0000 mg | ORAL_TABLET | Freq: Once | ORAL | Status: AC
Start: 2016-09-08 — End: 2016-09-08
  Administered 2016-09-08: 650 mg via ORAL
  Filled 2016-09-08: qty 2

## 2016-09-08 MED ORDER — LORATADINE 10 MG PO TABS
10.0000 mg | ORAL_TABLET | Freq: Every day | ORAL | Status: DC
  Administered 2016-09-09: 10 mg via ORAL
  Filled 2016-09-08: qty 1

## 2016-09-08 MED ORDER — MECLIZINE HCL 25 MG PO TABS
25.0000 mg | ORAL_TABLET | Freq: Three times a day (TID) | ORAL | Status: DC | PRN
  Filled 2016-09-08: qty 1

## 2016-09-08 MED ORDER — MAGNESIUM OXIDE 400 (241.3 MG) MG PO TABS
200.0000 mg | ORAL_TABLET | Freq: Every day | ORAL | Status: DC
  Administered 2016-09-08 – 2016-09-09 (×2): 200 mg via ORAL
  Filled 2016-09-08 (×2): qty 1

## 2016-09-08 MED ORDER — LORAZEPAM 2 MG/ML IJ SOLN
INTRAMUSCULAR | Status: AC
  Administered 2016-09-08: 1 mg via INTRAVENOUS
  Filled 2016-09-08: qty 1

## 2016-09-08 MED ORDER — INSULIN ASPART 100 UNIT/ML ~~LOC~~ SOLN: 0.0000 [IU] | Freq: Three times a day (TID) | SUBCUTANEOUS | Status: DC

## 2016-09-08 MED ORDER — ONDANSETRON HCL 4 MG/2ML IJ SOLN: 4.0000 mg | Freq: Once | INTRAMUSCULAR | Status: DC

## 2016-09-08 MED ORDER — IOPAMIDOL (ISOVUE-370) INJECTION 76%: 75.0000 mL | Freq: Once | INTRAVENOUS | Status: DC | PRN

## 2016-09-08 MED ORDER — HYDROCODONE-ACETAMINOPHEN 5-325 MG PO TABS: 1.0000 | ORAL_TABLET | Freq: Four times a day (QID) | ORAL | Status: DC | PRN

## 2016-09-08 MED ORDER — ACETAMINOPHEN 160 MG/5ML PO SOLN
650.0000 mg | ORAL | Status: DC | PRN
  Filled 2016-09-08: qty 20.3

## 2016-09-08 MED ORDER — ACETAMINOPHEN 325 MG PO TABS
650.0000 mg | ORAL_TABLET | ORAL | Status: DC | PRN
  Administered 2016-09-09 (×2): 650 mg via ORAL
  Filled 2016-09-08 (×2): qty 2

## 2016-09-08 MED ORDER — DEXTROSE 5 % IV SOLN
1.0000 g | INTRAVENOUS | Status: DC
  Filled 2016-09-08: qty 10

## 2016-09-08 MED ORDER — CALCIUM CARBONATE-VITAMIN D 500-200 MG-UNIT PO TABS
1.0000 | ORAL_TABLET | Freq: Every day | ORAL | Status: DC
  Administered 2016-09-08 – 2016-09-09 (×2): 1 via ORAL
  Filled 2016-09-08 (×2): qty 1

## 2016-09-08 MED ORDER — MELATONIN 5 MG PO TABS
5.0000 mg | ORAL_TABLET | Freq: Every evening | ORAL | Status: DC | PRN
  Administered 2016-09-08: 5 mg via ORAL
  Filled 2016-09-08 (×2): qty 1

## 2016-09-08 MED ORDER — AMLODIPINE BESYLATE 5 MG PO TABS
5.0000 mg | ORAL_TABLET | Freq: Every day | ORAL | Status: DC
  Administered 2016-09-09: 5 mg via ORAL
  Filled 2016-09-08: qty 1

## 2016-09-08 MED ORDER — CARVEDILOL 6.25 MG PO TABS
3.1250 mg | ORAL_TABLET | Freq: Two times a day (BID) | ORAL | Status: DC
  Administered 2016-09-09: 3.125 mg via ORAL
  Filled 2016-09-08: qty 1

## 2016-09-08 MED ORDER — STROKE: EARLY STAGES OF RECOVERY BOOK: Freq: Once | Status: DC

## 2016-09-08 MED ORDER — GABAPENTIN 100 MG PO CAPS: 100.0000 mg | ORAL_CAPSULE | Freq: Three times a day (TID) | ORAL | Status: DC | PRN

## 2016-09-08 MED ORDER — CARVEDILOL 6.25 MG PO TABS
6.2500 mg | ORAL_TABLET | Freq: Two times a day (BID) | ORAL | Status: DC
  Filled 2016-09-08: qty 1

## 2016-09-08 MED ORDER — ISOSORBIDE MONONITRATE ER 30 MG PO TB24
30.0000 mg | ORAL_TABLET | Freq: Every day | ORAL | Status: DC
  Administered 2016-09-09: 30 mg via ORAL
  Filled 2016-09-08: qty 1

## 2016-09-08 MED ORDER — HEPARIN SODIUM (PORCINE) 5000 UNIT/ML IJ SOLN
5000.0000 [IU] | Freq: Three times a day (TID) | INTRAMUSCULAR | Status: DC
  Administered 2016-09-08 – 2016-09-09 (×3): 5000 [IU] via SUBCUTANEOUS
  Filled 2016-09-08 (×3): qty 1

## 2016-09-08 MED ORDER — CEFTRIAXONE SODIUM 1 G IJ SOLR
1.0000 g | Freq: Once | INTRAMUSCULAR | Status: AC
  Administered 2016-09-08: 1 g via INTRAVENOUS
  Filled 2016-09-08: qty 10

## 2016-09-08 MED ORDER — FERROUS SULFATE 325 (65 FE) MG PO TABS
325.0000 mg | ORAL_TABLET | Freq: Two times a day (BID) | ORAL | Status: DC
  Administered 2016-09-08 – 2016-09-09 (×2): 325 mg via ORAL
  Filled 2016-09-08 (×2): qty 1

## 2016-09-08 NOTE — ED Triage Notes (Signed)
Pt to ED via EMS from home with c/o dizziness, lethargy, and nausea upon waking this am. Per EMS pt glucose was 63 and was given 1/2 amp of D50 with major improvement, 4mg  zofran, and 500cc of fluid in route. Pt is A&Ox4, denies any dizziness at this time. VS stable.

## 2016-09-08 NOTE — ED Notes (Signed)
cbg 117 at this time

## 2016-09-08 NOTE — ED Notes (Signed)
Family at bedside. 

## 2016-09-08 NOTE — ED Provider Notes (Addendum)
[facility] Emergency Department Provider Note  ____________________________________________  Time seen: Approximately 12:46 PM  I have reviewed the triage vital signs and the nursing notes.   HISTORY  Chief Complaint Dizziness and Emesis    HPI Jacqueline Rodriguez is a 56 y.o. female with a history of ESRD on HD,pulmonary hypertension, CHF, COPD, DM, HTN, presenting with lightheadedness, dizziness, right hand weakness and numbness, and headache, with associated hypoglycemia.  The patient reports that for the past several days she has had some postural lightheadedness. She underwent complete dialysis yesterday. This morning, the patient woke up a and felt lightheaded and dizzy while lying in the bed. She tried to make a phone call but dropped her phone due to right hand numbness and weakness. She noted her right leg to also be numb. She then developed a "all over" headache associated with nausea and had one episode of vomiting. She denies any visual changes but did have some slurred speech when she was being evaluated by the fire department. This has resolved. EMS noted the patient's blood sugar to be 68, and all of her symptoms except for her headache resolved after receiving half an amp of glucose. No recent changes in medications or trauma, no fevers or chills.   Past Medical History:  Diagnosis Date  . Anemia   . Chronic bronchitis ([place])   . Chronic diastolic CHF (congestive heart failure) ([place])   . CKD (chronic kidney disease), stage III   . COPD (chronic obstructive pulmonary disease) ([place])   . Diabetes mellitus with renal complications ([place])   . Essential hypertension   . GERD (gastroesophageal reflux disease)   . Obesity   . Peripheral vascular disease ([place])   . Pulmonary hypertension ([place])   . Renal insufficiency 09/19/2015   Stage 3 CKD. Beginning dialysis.    Patient Active Problem List   Diagnosis Date Noted  . ESRD on dialysis ([place]) 05/25/2016  .  Chronic diastolic heart failure ([place]) 07/30/2015  . Acute renal failure superimposed on stage 3 chronic kidney disease ([place]) 07/18/2015  . Diabetes mellitus with renal complications ([place])   . CKD (chronic kidney disease), stage III   . Pulmonary hypertension ([place])   . Obesity   . Morbid obesity due to excess calories ([place])   . Anemia in chronic renal disease   . Shortness of breath   . Back pain 05/01/2015  . Essential hypertension 03/25/2015  . Diabetes ([place]) 03/25/2015    Past Surgical History:  Procedure Laterality Date  . AV FISTULA PLACEMENT Left 10/01/2015   Procedure: ARTERIOVENOUS (AV) FISTULA CREATION ( BRACHIAL CEPHALIC );  Surgeon: Algernon Huxley, MD;  Location: [facility] ORS;  Service: Vascular;  Laterality: Left;  . CAPD INSERTION N/A 06/10/2016   Procedure: LAPAROSCOPIC INSERTION CONTINUOUS AMBULATORY PERITONEAL DIALYSIS  (CAPD) CATHETER;  Surgeon: Algernon Huxley, MD;  Location: [facility] ORS;  Service: Vascular;  Laterality: N/A;  . PERIPHERAL VASCULAR CATHETERIZATION N/A 07/21/2015   Procedure: Dialysis/Perma Catheter;  Surgeon: Algernon Huxley, MD;  Location: [place] [facility] LAB;  Service: Cardiovascular;  Laterality: N/A;  . PERIPHERAL VASCULAR CATHETERIZATION N/A 12/11/2015   Procedure: Dialysis/Perma Catheter Removal;  Surgeon: Algernon Huxley, MD;  Location: [place] [facility];  Service: Cardiovascular;  Laterality: N/A;    Current Outpatient Rx  . Order #: [id number] Class: Historical Med  . Order #: [id number] Class: Print  . Order #: [id number] Class: Print  . Order #: [id number] Class: Print  . Order #: [id number] Class: Historical Med  .  Order #: [id number] Class: Historical Med  . Order #: [id number] Class: Historical Med  . Order #: [id number] Class: Historical Med  . Order #: [id number] Class: Historical Med  . Order #: [id number] Class: Historical Med  . Order #: [id number] Class: Historical Med  . Order #: [id number] Class: Historical Med  . Order #: [id number] Class: Historical Med  . Order #:  [id number] Class: Print  . Order #: [id number] Class: Print  . Order #: [id number] Class: Historical Med  . Order #: [id number] Class: Historical Med  . Order #: [id number] Class: Historical Med  . Order #: [id number] Class: Print    Allergies Ambien [zolpidem]  Family History  Problem Relation Age of Onset  . Hypertension Mother   . Diabetes Mellitus II Mother   . CAD Father   . Hypertension Father   . Heart attack Father     Social History Social History  Substance Use Topics  . Smoking status: Former Smoker    Types: Cigarettes    Quit date: 03/24/1985  . Smokeless tobacco: Never Used  . Alcohol use No    Review of Systems Constitutional: No fever/chills.positive lightheadedness and dizziness. No syn Eyes: No visual changes.no blurred or double vision. ENT: No sore throat. No congestion or rhinorrhea. Cardiovascular: Denies chest pain. Denies palpitations. Respiratory: Denies shortness of breath.  No cough. Gastrointestinal: No abdominal pain.  + nausea, + vomiting.  No diarrhea.  No constipation. Genitourinary: Negative for dysuria.does continue to urinate. Musculoskeletal: Negative for back pain. Skin: Negative for rash. Neurological: positivefor headaches. Positive for right hand and right lower extremity numbness with hand weakness. No changes in vision. Positive changes in speech.     ____________________________________________   PHYSICAL EXAM:  VITAL SIGNS: ED Triage Vitals  Enc Vitals Group     BP 09/08/16 1211 (!) 159/72     Pulse Rate 09/08/16 1211 69     Resp 09/08/16 1211 16     Temp 09/08/16 1211 (!) 97.4 F (36.3 C)     Temp Source 09/08/16 1211 Oral     SpO2 09/08/16 1211 98 %     Weight 09/08/16 1212 219 lb (99.3 kg)     Height 09/08/16 1212 5\' 8"  (1.727 m)     Head Circumference --      Peak Flow --      Pain Score 09/08/16 1210 0     Pain Loc --      Pain Edu? --      Excl. in [place]? --     Constitutional: Alert and oriented. Chronically ill  appearing and in no acute distress. Answers questions appropriately. Eyes: Conjunctivae are normal.  EOMI. PERRLA. No horizontal or vertical nystagmus.No scleral icterus. Head: Atraumatic. Nose: No congestion/rhinnorhea. Mouth/Throat: Mucous membranes are mildly dry.  Neck: No stridor.  Supple.  No JVD.  No meningismus. Cardiovascular: Normal rate, regular rhythm.  + Holosystolic murmur. No rubs or gallops.  Respiratory: Normal respiratory effort.  No accessory muscle use or retractions. Lungs CTAB.  No wheezes, rales or ronchi. Gastrointestinal: Obese Soft, nontender and nondistended.  No guarding or rebound.  No peritoneal signs. GU: No evidence of external or palpable internal hemorrhoids. No pain with rectal examination. The patient has black stool that is guaiac negative. Musculoskeletal: No LE edema. No ttp in the calves or palpable cords.  Negative Homan's sign. Neurologic: Alert and oriented 3. Speech is clear. Face and smile symmetric. Tongue is midline. EOMI.  PERRLA.  No horizontal or vertical nystagmus. No pronator drift. 5 out of 5 grip, biceps, triceps, hip flexors,  plantar flexion and dorsiflexion. Normal sensation to light touch in the bilateral upper and lower extremities, and face. Normal heel-to-shin. Skin:  Skin is warm, dry and intact. No rash noted. Psychiatric: Mood and affect are normal. Speech and behavior are normal.  Normal judgement.  ____________________________________________   LABS (all labs ordered are listed, but only abnormal results are displayed)  Labs Reviewed  GLUCOSE, CAPILLARY - Abnormal; Notable for the following:       Result Value   Glucose-Capillary 117 (*)    All other components within normal limits  URINALYSIS, COMPLETE (UACMP) WITH MICROSCOPIC - Abnormal; Notable for the following:    Color, Urine YELLOW (*)    APPearance HAZY (*)    Glucose, UA 50 (*)    Hgb urine dipstick SMALL (*)    Protein, ur 100 (*)    Leukocytes, UA TRACE (*)     Bacteria, UA RARE (*)    Squamous Epithelial / LPF 6-30 (*)    All other components within normal limits  BASIC METABOLIC PANEL - Abnormal; Notable for the following:    Sodium 129 (*)    Chloride 98 (*)    CO2 18 (*)    Glucose, Bld 122 (*)    BUN 61 (*)    Creatinine, Ser 8.48 (*)    Calcium 7.6 (*)    GFR calc non Af Amer 5 (*)    GFR calc Af Amer 5 (*)    All other components within normal limits  CBC - Abnormal; Notable for the following:    RBC 3.07 (*)    Hemoglobin 9.9 (*)    HCT 29.1 (*)    RDW 16.8 (*)    All other components within normal limits  GLUCOSE, CAPILLARY - Abnormal; Notable for the following:    Glucose-Capillary 104 (*)    All other components within normal limits  URINE CULTURE  CBG MONITORING, ED  CBG MONITORING, ED  CBG MONITORING, ED  CBG MONITORING, ED  CBG MONITORING, ED  CBG MONITORING, ED  CBG MONITORING, ED  CBG MONITORING, ED  CBG MONITORING, ED  CBG MONITORING, ED  CBG MONITORING, ED  CBG MONITORING, ED  CBG MONITORING, ED  CBG MONITORING, ED  CBG MONITORING, ED  TYPE AND SCREEN   ____________________________________________  EKG  ED ECG REPORT I, Eula Listen, the attending physician, personally viewed and interpreted this ECG.   Date: 09/08/2016  EKG Time: 1221  Rate: 68  Rhythm: normal sinus rhythm  Axis: leftward  Intervals:Prolonged QTc  ST&T Change: Nonspecific T-wave inversion in V1. Incomplete left bundle branch block. No STEMI.  This EKG is compared to 11/17 and is grossly unchanged.  ____________________________________________  RADIOLOGY  Ct Angio Head W Or Wo Contrast  Result Date: 09/08/2016 CLINICAL DATA:  Persistent vertigo, central. EXAM: CT ANGIOGRAPHY HEAD AND NECK TECHNIQUE: Multidetector CT imaging of the head and neck was performed using the standard protocol during bolus administration of intravenous contrast. Multiplanar CT image reconstructions and MIPs were obtained to evaluate the  vascular anatomy. Carotid stenosis measurements (when applicable) are obtained utilizing NASCET criteria, using the distal internal carotid diameter as the denominator. CONTRAST:  75 cc Isovue 370 intravenous COMPARISON:  None. FINDINGS: CT head: Brain: No evidence of infarct, hemorrhage, hydrocephalus, or masslike finding. Vascular: See below. Skull: Negative. Sinuses and orbits: Negative. CTA NECK FINDINGS Aortic arch: Atherosclerotic plaque on the aorta. Three vessel branching. No acute finding. Right carotid system: Mild partially calcified plaque at the common carotid bifurcation.  No stenosis, ulceration, or dissection. Left carotid system: Prominent plaque at the CCA origin with ~50% luminal stenosis. Moderate peripherally calcified centrally low-density plaque at the common carotid bifurcation with 25% proximal ICA narrowing. No definite ulceration. There is mild motion artifact at the level of the left CCA bifurcation. Mild proximal ECA narrowing. Vertebral arteries: No proximal subclavian stenosis. Both vertebral arteries are smooth and widely patent to the dura. Skeleton: No acute or aggressive finding. Other neck: No incidental mass or inflammation noted. Upper chest: Chronic scar-like opacity at the left apex with calcification, also seen on chest CT 01/08/2015. Hazy appearance of the upper lungs which could be from atelectasis. Fissural fluid in the upper left major fissure. Chest x-ray performed today. Review of the MIP images confirms the above findings CTA HEAD FINDINGS Anterior circulation: Atherosclerotic plaque on the carotid siphons without stenosis or ulceration. No branch occlusion, beading, or aneurysm. Posterior circulation: Essentially codominant vertebral arteries. Vertebral and basilar arteries are smooth and widely patent Venous sinuses: Patent Anatomic variants: None significant Delayed phase: No abnormal intracranial enhancement. Review of the MIP images confirms the above findings  IMPRESSION: 1. No acute finding.  No evidence of vertebrobasilar insufficiency. 2. Atherosclerosis, most notable in the left carotid circulation where there is 50% common carotid origins stenosis and 25% proximal ICA stenosis. 3. Electronically Signed   By: Monte Fantasia M.D.   On: 09/08/2016 14:27   Dg Chest 2 View  Result Date: 09/08/2016 CLINICAL DATA:  Hypoglycemia earlier today. History of dialysis dependent renal failure, CHF, COPD, former smoker. EXAM: CHEST  2 VIEW COMPARISON:  Chest x-ray of July 17, 2015 FINDINGS: The lungs are well-expanded. The interstitial markings are mildly increased. The pulmonary vascularity is mildly engorged. The cardiac silhouette is enlarged. The left hemidiaphragm is obscured. There is calcification in the wall of the aortic arch. There is some fluid in the major fissure presumably on the left. No significant costophrenic angle angle blunting is observed. IMPRESSION: Mild CHF.  No alveolar pneumonia nor large pleural effusion. Thoracic aortic atherosclerosis. Electronically Signed   By: David  Martinique M.D.   On: 09/08/2016 13:40   Ct Angio Neck W And/or Wo Contrast  Result Date: 09/08/2016 CLINICAL DATA:  Persistent vertigo, central. EXAM: CT ANGIOGRAPHY HEAD AND NECK TECHNIQUE: Multidetector CT imaging of the head and neck was performed using the standard protocol during bolus administration of intravenous contrast. Multiplanar CT image reconstructions and MIPs were obtained to evaluate the vascular anatomy. Carotid stenosis measurements (when applicable) are obtained utilizing NASCET criteria, using the distal internal carotid diameter as the denominator. CONTRAST:  75 cc Isovue 370 intravenous COMPARISON:  None. FINDINGS: CT head: Brain: No evidence of infarct, hemorrhage, hydrocephalus, or masslike finding. Vascular: See below. Skull: Negative. Sinuses and orbits: Negative. CTA NECK FINDINGS Aortic arch: Atherosclerotic plaque on the aorta. Three vessel branching.  No acute finding. Right carotid system: Mild partially calcified plaque at the common carotid bifurcation. No stenosis, ulceration, or dissection. Left carotid system: Prominent plaque at the CCA origin with ~50% luminal stenosis. Moderate peripherally calcified centrally low-density plaque at the common carotid bifurcation with 25% proximal ICA narrowing. No definite ulceration. There is mild motion artifact at the level of the left CCA bifurcation. Mild proximal ECA narrowing. Vertebral arteries: No proximal subclavian stenosis. Both vertebral arteries are smooth and widely patent to the dura. Skeleton: No acute or aggressive finding. Other neck: No incidental mass or inflammation noted. Upper chest: Chronic scar-like opacity at the left apex with calcification,  also seen on chest CT 01/08/2015. Hazy appearance of the upper lungs which could be from atelectasis. Fissural fluid in the upper left major fissure. Chest x-ray performed today. Review of the MIP images confirms the above findings CTA HEAD FINDINGS Anterior circulation: Atherosclerotic plaque on the carotid siphons without stenosis or ulceration. No branch occlusion, beading, or aneurysm. Posterior circulation: Essentially codominant vertebral arteries. Vertebral and basilar arteries are smooth and widely patent Venous sinuses: Patent Anatomic variants: None significant Delayed phase: No abnormal intracranial enhancement. Review of the MIP images confirms the above findings IMPRESSION: 1. No acute finding.  No evidence of vertebrobasilar insufficiency. 2. Atherosclerosis, most notable in the left carotid circulation where there is 50% common carotid origins stenosis and 25% proximal ICA stenosis. 3. Electronically Signed   By: Monte Fantasia M.D.   On: 09/08/2016 14:27    ____________________________________________   PROCEDURES  Procedure(s) performed: None  Procedures  Critical Care performed:  No ____________________________________________   INITIAL IMPRESSION / ASSESSMENT AND PLAN / ED COURSE  Pertinent labs & imaging results that were available during my care of the patient were reviewed by me and considered in my medical decision making (see chart for details).  56 y.o. female presenting with lightheadedness and dizziness, right hand and right lower extremity numbness and weakness, headache, nausea and vomiting, and hypoglycemia. It is possible that the patient's symptoms are all due to hypoglycemia ad will continue to monitor this; she last took her glipizide asked night. We also do a full stroke evaluation as the patient is high risk and did have multiple neurologic deficits, although she has no focal deficits on my examination at this time. We will also check for other laboratory abnormalities, UTI. Plan reevaluation for final disposition.  ----------------------------------------- 3:05 PM on 09/08/2016 -----------------------------------------  The patient has multiple findings today that may be related her symptoms and will require admission. She has an 8 point drop in her hematocrit, and reports a week of black stools. She isn't an iron supplement and has no bleeding on my examination but this will need to be monitored. In addition, she has a urinary tract infection for which I have ordered Rocephin. Her CT and CT angiogram are reassuring, and I have paged Dr. Doy Mince of neurology and ordered an MR for continued evaluation for possible stroke. She has continued to maintain stable blood sugars, and her headache has improved since arriving. She continues to be mildly lightheaded but has not had any additional neurologic findings. At this time I'll plan to admit the patient to the hospitalist for continued evaluation.  ____________________________________________  FINAL CLINICAL IMPRESSION(S) / ED DIAGNOSES  Final diagnoses:  Acute cystitis without hematuria  Hyponatremia   Numbness  Weakness  Acute nonintractable headache, unspecified headache type  Hypoglycemia         NEW MEDICATIONS STARTED DURING THIS VISIT:  New Prescriptions   No medications on file      Eula Listen, MD 09/08/16 1507    Eula Listen, MD 09/08/16 [phone]

## 2016-09-08 NOTE — ED Notes (Addendum)
Pt dialysis pt , was last fully dialyzed yesterday

## 2016-09-08 NOTE — ED Notes (Signed)
[phone] husband ken

## 2016-09-08 NOTE — ED Notes (Signed)
ED Provider at bedside. 

## 2016-09-08 NOTE — Progress Notes (Signed)
Dr. Posey Pronto texted patient's request for sleep aid with Melatonin in it, not Ambien, as it makes her sleep walk.  Waiting for callback or orders. Barbaraann Faster, RN 7:45 PM 09/08/2016

## 2016-09-08 NOTE — Progress Notes (Signed)
Dr. Ara Kussmaul notified of Centralized Telemetry calls regarding 8 beat runs of V-tach, PVC's and SB, Junctional rhythm; patient asymptomatic; acknowledged; ordered magnesium level; Barbaraann Faster, RN 8:46 PM 09/08/2016

## 2016-09-08 NOTE — H&P (Signed)
[facility] [place] NAME: Jacqueline Rodriguez    MR#:  [id number]  DATE OF BIRTH:  [birth year]  DATE OF ADMISSION:  09/08/2016  PRIMARY CARE PHYSICIAN: Christie Nottingham, PA   REQUESTING/REFERRING PHYSICIAN: Dr Mariea Clonts  CHIEF COMPLAINT:   Dizziness with weakness today in the morning. HISTORY OF PRESENT ILLNESS:  Jacqueline Rodriguez  is a 56 y.o. female with a known history of hypertension, chronic anemia, diabetes, end-stage renal disease on hemodialysis comes to the emergency room after she woke up around 10:30 feeling dizzy lightheaded and almost passed out. Patient called EMS found to have blood sugar of 68. They gave her dextrose half and patient felt better brought to the emergency room where CT head and neck angiogramwere negative. Per ER physician patient had some numbness in the right upper and lower extremity which is resolved she is being admitted with dizziness/presyncope/possible rule out stroke/hypoglycemia transient.  PAST MEDICAL HISTORY:   Past Medical History:  Diagnosis Date  . Anemia   . Chronic bronchitis ([place])   . Chronic diastolic CHF (congestive heart failure) ([place])   . CKD (chronic kidney disease), stage III   . COPD (chronic obstructive pulmonary disease) ([place])   . Diabetes mellitus with renal complications ([place])   . Essential hypertension   . GERD (gastroesophageal reflux disease)   . Obesity   . Peripheral vascular disease ([place])   . Pulmonary hypertension ([place])   . Renal insufficiency 09/19/2015   Stage 3 CKD. Beginning dialysis.    PAST SURGICAL HISTOIRY:   Past Surgical History:  Procedure Laterality Date  . AV FISTULA PLACEMENT Left 10/01/2015   Procedure: ARTERIOVENOUS (AV) FISTULA CREATION ( BRACHIAL CEPHALIC );  Surgeon: Algernon Huxley, MD;  Location: [facility] ORS;  Service: Vascular;  Laterality: Left;  . CAPD INSERTION N/A 06/10/2016   Procedure: LAPAROSCOPIC INSERTION CONTINUOUS AMBULATORY PERITONEAL DIALYSIS  (CAPD)  CATHETER;  Surgeon: Algernon Huxley, MD;  Location: [facility] ORS;  Service: Vascular;  Laterality: N/A;  . PERIPHERAL VASCULAR CATHETERIZATION N/A 07/21/2015   Procedure: Dialysis/Perma Catheter;  Surgeon: Algernon Huxley, MD;  Location: [facility];  Service: Cardiovascular;  Laterality: N/A;  . PERIPHERAL VASCULAR CATHETERIZATION N/A 12/11/2015   Procedure: Dialysis/Perma Catheter Removal;  Surgeon: Algernon Huxley, MD;  Location: [facility];  Service: Cardiovascular;  Laterality: N/A;    SOCIAL HISTORY:   Social History  Substance Use Topics  . Smoking status: Former Smoker    Types: Cigarettes    Quit date: 03/24/1985  . Smokeless tobacco: Never Used  . Alcohol use No    FAMILY HISTORY:   Family History  Problem Relation Age of Onset  . Hypertension Mother   . Diabetes Mellitus II Mother   . CAD Father   . Hypertension Father   . Heart attack Father     DRUG ALLERGIES:   Allergies  Allergen Reactions  . Ambien [Zolpidem] Other (See Comments)    hallucinations    REVIEW OF SYSTEMS:  Review of Systems  Constitutional: Negative for chills, fever and weight loss.  HENT: Negative for ear discharge, ear pain and nosebleeds.   Eyes: Negative for blurred vision, pain and discharge.  Respiratory: Negative for sputum production, shortness of breath, wheezing and stridor.   Cardiovascular: Negative for chest pain, palpitations, orthopnea and PND.  Gastrointestinal: Negative for abdominal pain, diarrhea, nausea and vomiting.  Genitourinary: Negative for frequency and urgency.  Musculoskeletal: Negative for back pain and joint  pain.  Neurological: Positive for dizziness and weakness. Negative for sensory change, speech change and focal weakness.  Psychiatric/Behavioral: Negative for depression and hallucinations. The patient is not nervous/anxious.      MEDICATIONS AT HOME:   Prior to Admission medications   Medication Sig Start Date End Date Taking? Authorizing  Provider  acetaminophen (TYLENOL) 500 MG tablet Take 500 mg by mouth every 6 (six) hours as needed.   Yes [provider]  albuterol (PROVENTIL HFA;VENTOLIN HFA) 108 (90 Base) MCG/ACT inhaler Inhale 2 puffs into the lungs every 6 (six) hours as needed for wheezing or shortness of breath. 01/08/15  Yes Gregor Hams, MD  amLODipine (NORVASC) 5 MG tablet Take 1 tablet (5 mg total) by mouth daily. 07/24/15  Yes Vaughan Basta, MD  aspirin EC 81 MG EC tablet Take 1 tablet (81 mg total) by mouth daily. Patient taking differently: Take 162 mg by mouth at bedtime.  03/20/15  Yes Vaughan Basta, MD  Calcium-Magnesium-Vitamin D (CALCIUM 1200+D3 PO) Take 1 tablet by mouth daily.   Yes [provider]  carvedilol (COREG) 6.25 MG tablet Take 6.25 mg by mouth 2 (two) times daily with a meal.   Yes [provider]  cetirizine (ZYRTEC) 10 MG tablet Take 10 mg by mouth daily as needed for allergies.   Yes [provider]  ferrous sulfate 325 (65 FE) MG tablet Take 325 mg by mouth 2 (two) times daily.   Yes [provider]  gabapentin (NEURONTIN) 100 MG capsule Take 100 mg by mouth 3 (three) times daily as needed. For neuropathy. 03/05/15  Yes [provider]  gentamicin cream (GARAMYCIN) 0.1 % Apply 1 application topically daily. 06/25/16  Yes [provider]  glipiZIDE (GLUCOTROL) 5 MG tablet Take 5 mg by mouth daily before breakfast.    Yes [provider]  GLUCOSAMINE-CHONDROITIN PO Take 4,000 mg by mouth daily.   Yes [provider]  hydrALAZINE (APRESOLINE) 25 MG tablet Take 25 mg by mouth 3 (three) times daily.   Yes [provider]  isosorbide mononitrate (IMDUR) 30 MG 24 hr tablet Take 30 mg by mouth daily.   Yes [provider]  lisinopril (PRINIVIL,ZESTRIL) 10 MG tablet Take 10 mg by mouth daily.    Yes [provider]  meclizine (ANTIVERT) 25 MG tablet Take 1 tablet (25 mg total) by  mouth 3 (three) times daily as needed for dizziness. 12/08/15  Yes Nance Pear, MD  furosemide (LASIX) 20 MG tablet Take 20 mg by mouth daily.    [provider]  HYDROcodone-acetaminophen (NORCO) 5-325 MG tablet Take 1 tablet by mouth every 6 (six) hours as needed for moderate pain. 10/01/15   Algernon Huxley, MD  HYDROcodone-acetaminophen (NORCO) 5-325 MG tablet Take 1 tablet by mouth every 6 (six) hours as needed for moderate pain. 06/10/16   Algernon Huxley, MD  Magnesium Oxide 250 MG TABS Take 1 tablet by mouth daily.    [provider]      VITAL SIGNS:  Blood pressure (!) 159/72, pulse 65, temperature (!) 97.4 F (36.3 C), temperature source Oral, resp. rate 16, height 5\' 8"  (1.727 m), weight 99.3 kg (219 lb), last menstrual period 09/18/2013, SpO2 100 %.  PHYSICAL EXAMINATION:  GENERAL:  56 y.o.-year-old patient lying in the bed with no acute distress.  EYES: Pupils equal, round, reactive to light and accommodation. No scleral icterus. Extraocular muscles intact.  HEENT: Head atraumatic, normocephalic. Oropharynx and nasopharynx clear.  NECK:  Supple, no jugular venous distention. No thyroid enlargement, no tenderness.  LUNGS: Normal breath sounds bilaterally, no wheezing, rales,rhonchi or crepitation. No use of accessory muscles of respiration.  CARDIOVASCULAR: S1, S2 normal. No murmurs, rubs, or gallops.  ABDOMEN: Soft, nontender, nondistended. Bowel sounds present. No organomegaly or mass. pD catheter site looks okay EXTREMITIES: No pedal edema, cyanosis, or clubbing. Left upper extremity HDL axis NEUROLOGIC: Cranial nerves II through XII are intact. Muscle strength 5/5 in all extremities. Sensation intact. Gait not checked.  PSYCHIATRIC: The patient is alert and oriented x 3.  SKIN: No obvious rash, lesion, or ulcer.   LABORATORY PANEL:   CBC  Recent Labs Lab 09/08/16 1227  WBC 6.1  HGB 9.9*  HCT 29.1*  PLT 153    ------------------------------------------------------------------------------------------------------------------  Chemistries   Recent Labs Lab 09/08/16 1227  NA 129*  K 5.0  CL 98*  CO2 18*  GLUCOSE 122*  BUN 61*  CREATININE 8.48*  CALCIUM 7.6*   ------------------------------------------------------------------------------------------------------------------  Cardiac Enzymes No results for input(s): TROPONINI in the last 168 hours. ------------------------------------------------------------------------------------------------------------------  RADIOLOGY:  Ct Angio Head W Or Wo Contrast  Result Date: 09/08/2016 CLINICAL DATA:  Persistent vertigo, central. EXAM: CT ANGIOGRAPHY HEAD AND NECK TECHNIQUE: Multidetector CT imaging of the head and neck was performed using the standard protocol during bolus administration of intravenous contrast. Multiplanar CT image reconstructions and MIPs were obtained to evaluate the vascular anatomy. Carotid stenosis measurements (when applicable) are obtained utilizing NASCET criteria, using the distal internal carotid diameter as the denominator. CONTRAST:  75 cc Isovue 370 intravenous COMPARISON:  None. FINDINGS: CT head: Brain: No evidence of infarct, hemorrhage, hydrocephalus, or masslike finding. Vascular: See below. Skull: Negative. Sinuses and orbits: Negative. CTA NECK FINDINGS Aortic arch: Atherosclerotic plaque on the aorta. Three vessel branching. No acute finding. Right carotid system: Mild partially calcified plaque at the common carotid bifurcation. No stenosis, ulceration, or dissection. Left carotid system: Prominent plaque at the CCA origin with ~50% luminal stenosis. Moderate peripherally calcified centrally low-density plaque at the common carotid bifurcation with 25% proximal ICA narrowing. No definite ulceration. There is mild motion artifact at the level of the left CCA bifurcation. Mild proximal ECA narrowing. Vertebral  arteries: No proximal subclavian stenosis. Both vertebral arteries are smooth and widely patent to the dura. Skeleton: No acute or aggressive finding. Other neck: No incidental mass or inflammation noted. Upper chest: Chronic scar-like opacity at the left apex with calcification, also seen on chest CT 01/08/2015. Hazy appearance of the upper lungs which could be from atelectasis. Fissural fluid in the upper left major fissure. Chest x-ray performed today. Review of the MIP images confirms the above findings CTA HEAD FINDINGS Anterior circulation: Atherosclerotic plaque on the carotid siphons without stenosis or ulceration. No branch occlusion, beading, or aneurysm. Posterior circulation: Essentially codominant vertebral arteries. Vertebral and basilar arteries are smooth and widely patent Venous sinuses: Patent Anatomic variants: None significant Delayed phase: No abnormal intracranial enhancement. Review of the MIP images confirms the above findings IMPRESSION: 1. No acute finding.  No evidence of vertebrobasilar insufficiency. 2. Atherosclerosis, most notable in the left carotid circulation where there is 50% common carotid origins stenosis and 25% proximal ICA stenosis. 3. Electronically Signed   By: Monte Fantasia M.D.   On: 09/08/2016 14:27   Dg Chest 2 View  Result Date: 09/08/2016 CLINICAL DATA:  Hypoglycemia earlier today. History of dialysis dependent renal failure, CHF, COPD, former smoker. EXAM: CHEST  2 VIEW COMPARISON:  Chest x-ray of July 17, 2015 FINDINGS: The lungs are well-expanded. The interstitial markings are mildly increased. The pulmonary vascularity is mildly engorged. The cardiac silhouette is enlarged. The left hemidiaphragm is obscured. There is calcification in the wall of the aortic arch. There is some fluid in the major fissure presumably on the left. No significant costophrenic angle angle blunting is observed. IMPRESSION: Mild CHF.  No alveolar pneumonia nor large pleural effusion.  Thoracic aortic atherosclerosis. Electronically Signed   By: David  Martinique M.D.   On: 09/08/2016 13:40   Ct Angio Neck W And/or Wo Contrast  Result Date: 09/08/2016 CLINICAL DATA:  Persistent vertigo, central. EXAM: CT ANGIOGRAPHY HEAD AND NECK TECHNIQUE: Multidetector CT imaging of the head and neck was performed using the standard protocol during bolus administration of intravenous contrast. Multiplanar CT image reconstructions and MIPs were obtained to evaluate the vascular anatomy. Carotid stenosis measurements (when applicable) are obtained utilizing NASCET criteria, using the distal internal carotid diameter as the denominator. CONTRAST:  75 cc Isovue 370 intravenous COMPARISON:  None. FINDINGS: CT head: Brain: No evidence of infarct, hemorrhage, hydrocephalus, or masslike finding. Vascular: See below. Skull: Negative. Sinuses and orbits: Negative. CTA NECK FINDINGS Aortic arch: Atherosclerotic plaque on the aorta. Three vessel branching. No acute finding. Right carotid system: Mild partially calcified plaque at the common carotid bifurcation. No stenosis, ulceration, or dissection. Left carotid system: Prominent plaque at the CCA origin with ~50% luminal stenosis. Moderate peripherally calcified centrally low-density plaque at the common carotid bifurcation with 25% proximal ICA narrowing. No definite ulceration. There is mild motion artifact at the level of the left CCA bifurcation. Mild proximal ECA narrowing. Vertebral arteries: No proximal subclavian stenosis. Both vertebral arteries are smooth and widely patent to the dura. Skeleton: No acute or aggressive finding. Other neck: No incidental mass or inflammation noted. Upper chest: Chronic scar-like opacity at the left apex with calcification, also seen on chest CT 01/08/2015. Hazy appearance of the upper lungs which could be from atelectasis. Fissural fluid in the upper left major fissure. Chest x-ray performed today. Review of the MIP images  confirms the above findings CTA HEAD FINDINGS Anterior circulation: Atherosclerotic plaque on the carotid siphons without stenosis or ulceration. No branch occlusion, beading, or aneurysm. Posterior circulation: Essentially codominant vertebral arteries. Vertebral and basilar arteries are smooth and widely patent Venous sinuses: Patent Anatomic variants: None significant Delayed phase: No abnormal intracranial enhancement. Review of the MIP images confirms the above findings IMPRESSION: 1. No acute finding.  No evidence of vertebrobasilar insufficiency. 2. Atherosclerosis, most notable in the left carotid circulation where there is 50% common carotid origins stenosis and 25% proximal ICA stenosis. 3. Electronically Signed   By: Monte Fantasia M.D.   On: 09/08/2016 14:27    EKG:    IMPRESSION AND PLAN:   Jacqueline Rodriguez  is a 56 y.o. female with a known history of hypertension, chronic anemia, diabetes, end-stage renal disease on hemodialysis comes to the emergency room after she woke up around 10:30 feeling dizzy lightheaded and almost passed out. Patient called EMS found to have blood sugar of 68  1. Dizziness/numbness right upper extremity and near passing out spell suspected secondary to hypoglycemia transient -Patient feels a whole lot better -admitted for overnight observation -cT headand neck  negative for stroke. -ER M.D. Has already ordered a MRI of the head -Continue aspirin  2. End-stage renal disease on hemodialysis -Nephrology consultation for hemodialysis -Patient recently had a PD catheter placed about a month ago and is in the  process of getting it started soon  3. Hypertension resume all home meds  4. Hyperglycemia in the setting of history of type 2 diabetes Hold glipizide Sliding scale insulin Bedtime snack  5. DVT prophylaxis subcutaneous heparin  Above was discussed with patient no family present All the records are reviewed and case discussed with ED  provider.   CODE STATUS: ull  TOTAL TIME TAKING CARE OF THIS PATIENT: 56minutes.    Cindie Rajagopalan M.D on 09/08/2016 at 3:43 PM  Between 7am to 6pm - Pager - [phone]  After 6pm go to [email or web address] - password [email or web address] [facility]  [facility]  Office  [phone]  CC: Primary care physician; Christie Nottingham, PA

## 2016-09-09 DIAGNOSIS — R4182 Altered mental status, unspecified: Secondary | ICD-10-CM | POA: Diagnosis not present

## 2016-09-09 DIAGNOSIS — E162 Hypoglycemia, unspecified: Secondary | ICD-10-CM | POA: Diagnosis not present

## 2016-09-09 DIAGNOSIS — I10 Essential (primary) hypertension: Secondary | ICD-10-CM | POA: Diagnosis not present

## 2016-09-09 DIAGNOSIS — R55 Syncope and collapse: Secondary | ICD-10-CM | POA: Diagnosis not present

## 2016-09-09 DIAGNOSIS — N186 End stage renal disease: Secondary | ICD-10-CM | POA: Diagnosis not present

## 2016-09-09 DIAGNOSIS — R42 Dizziness and giddiness: Secondary | ICD-10-CM | POA: Diagnosis not present

## 2016-09-09 LAB — GLUCOSE, CAPILLARY
GLUCOSE-CAPILLARY: 104 mg/dL — AB (ref 65–99)
Glucose-Capillary: 77 mg/dL (ref 65–99)

## 2016-09-09 NOTE — Progress Notes (Signed)
09/09/2016 3:02 PM  Jacqueline Rodriguez to be D/C'd Home per MD order.  Discussed prescriptions and follow up appointments with the patient. Prescriptions given to patient, medication list explained in detail. Pt verbalized understanding.  Allergies as of 09/09/2016      Reactions   Ambien [zolpidem] Other (See Comments)   hallucinations      Medication List    STOP taking these medications   carvedilol 6.25 MG tablet Commonly known as:  COREG   glipiZIDE 5 MG tablet Commonly known as:  GLUCOTROL     TAKE these medications   acetaminophen 500 MG tablet Commonly known as:  TYLENOL Take 500 mg by mouth every 6 (six) hours as needed.   albuterol 108 (90 Base) MCG/ACT inhaler Commonly known as:  PROVENTIL HFA;VENTOLIN HFA Inhale 2 puffs into the lungs every 6 (six) hours as needed for wheezing or shortness of breath.   amLODipine 5 MG tablet Commonly known as:  NORVASC Take 1 tablet (5 mg total) by mouth daily.   aspirin 81 MG EC tablet Take 1 tablet (81 mg total) by mouth daily. What changed:  how much to take  when to take this   CALCIUM 1200+D3 PO Take 1 tablet by mouth daily.   cetirizine 10 MG tablet Commonly known as:  ZYRTEC Take 10 mg by mouth daily as needed for allergies.   ferrous sulfate 325 (65 FE) MG tablet Take 325 mg by mouth 2 (two) times daily.   furosemide 20 MG tablet Commonly known as:  LASIX Take 20 mg by mouth daily.   gabapentin 100 MG capsule Commonly known as:  NEURONTIN Take 100 mg by mouth 3 (three) times daily as needed. For neuropathy.   gentamicin cream 0.1 % Commonly known as:  GARAMYCIN Apply 1 application topically daily.   GLUCOSAMINE-CHONDROITIN PO Take 4,000 mg by mouth daily.   hydrALAZINE 25 MG tablet Commonly known as:  APRESOLINE Take 25 mg by mouth 3 (three) times daily.   HYDROcodone-acetaminophen 5-325 MG tablet Commonly known as:  NORCO Take 1 tablet by mouth every 6 (six) hours as needed for moderate pain.    HYDROcodone-acetaminophen 5-325 MG tablet Commonly known as:  NORCO Take 1 tablet by mouth every 6 (six) hours as needed for moderate pain.   isosorbide mononitrate 30 MG 24 hr tablet Commonly known as:  IMDUR Take 30 mg by mouth daily.   lisinopril 10 MG tablet Commonly known as:  PRINIVIL,ZESTRIL Take 10 mg by mouth daily.   Magnesium Oxide 250 MG Tabs Take 1 tablet by mouth daily.   meclizine 25 MG tablet Commonly known as:  ANTIVERT Take 1 tablet (25 mg total) by mouth 3 (three) times daily as needed for dizziness.            Discharge Care Instructions        Start     Ordered   09/09/16 0000  Increase activity slowly     09/09/16 1359   09/09/16 0000  Diet - low sodium heart healthy     09/09/16 1359      Vitals:   09/09/16 0700 09/09/16 1259  BP: (!) 162/46 (!) 150/62  Pulse: (!) 51 63  Resp:  16  Temp: (!) 97.5 F (36.4 C) 98 F (36.7 C)  SpO2: 96% 99%    Skin clean, dry and intact without evidence of skin break down, no evidence of skin tears noted. IV catheter discontinued intact. Site without signs and symptoms of complications. Dressing  and pressure applied. Pt denies pain at this time. No complaints noted.  An After Visit Summary was printed and given to the patient. Patient escorted via [place], and D/C home via private auto.  Dola Argyle

## 2016-09-09 NOTE — Evaluation (Signed)
Physical Therapy Evaluation and Discharge Patient Details Name: Jacqueline Rodriguez MRN: [id number] DOB: [birth year] Today's Date: 09/09/2016   History of Present Illness  Pt is a 56 y/o F who presented after she woke up feeling dizzy and almost passed out.  EMS found blood sugar to be 68.  CT head and neck angiogramwere negative. Per ER physician patient had some numbness in the right upper and lower extremity which is resolved she was admitted with dizziness/presyncope/possible rule out stroke/hypoglycemia transient.  MRI brain was negative for acute stroke.  Pt's PMH includes CHF, CKD, COPD, obesity, PVD.    Clinical Impression  Pt admitted with above diagnosis. The pt denies any symptoms of dizziness throughout session.  She reports fatigue as she did not sleep well and reported fatigue after ambulating in hallway.  No physical assist or cues required for mobility.  Pt independent with bed mobility, transfers, ambulation.  No instability with higher level balance activities as documented below.  No skilled PT needs identified, PT will sign off.     Follow Up Recommendations No PT follow up    Equipment Recommendations  None recommended by PT    Recommendations for Other Services       Precautions / Restrictions Precautions Precautions: Fall Restrictions Weight Bearing Restrictions: No      Mobility  Bed Mobility Overal bed mobility: Independent             General bed mobility comments: No physical assist or cues needed.  Pt performs independently.  Transfers Overall transfer level: Independent Equipment used: None             General transfer comment: Pt performs independently.  No physical assist or cues needed.  Ambulation/Gait Ambulation/Gait assistance: Independent Ambulation Distance (Feet): 200 Feet Assistive device: None Gait Pattern/deviations: Wide base of support   Gait velocity interpretation: at or above normal speed for age/gender General Gait  Details: Pt ambulates with wide BOS but no LOB and no physical assist or cues needed.  Pt reports faituge in [place] after ambulating around nurses station.   Stairs            Wheelchair Mobility    Modified Rankin (Stroke Patients Only)       Balance Overall balance assessment: Needs assistance Sitting-balance support: No upper extremity supported;Feet supported Sitting balance-Leahy Scale: Good     Standing balance support: No upper extremity supported;During functional activity Standing balance-Leahy Scale: Good                   Standardized Balance Assessment Standardized Balance Assessment : Dynamic Gait Index   Dynamic Gait Index Level Surface: Normal Change in Gait Speed: Normal Gait with Horizontal Head Turns: Normal Gait with Vertical Head Turns: Normal Gait and Pivot Turn: Normal Step Over Obstacle: Normal Step Around Obstacles: Normal       Pertinent Vitals/Pain Pain Assessment: No/denies pain    Home Living Family/patient expects to be discharged to:: Private residence Living Arrangements: Spouse/significant other;Children;Other (Comment) (two sons and their families) Available Help at Discharge: Family;Available PRN/intermittently Type of Home: House Home Access: Ramped entrance     Home Layout: One level Home Equipment: Walker - 2 wheels;Wheelchair - manual      Prior Function Level of Independence: Independent         Comments: Pt is independent with all ADLs at baseline.  She ambulates household distances without AD and denies any falls in the past 6 months.  Hand Dominance        Extremity/Trunk Assessment   Upper Extremity Assessment Upper Extremity Assessment: Overall WFL for tasks assessed    Lower Extremity Assessment Lower Extremity Assessment: Overall WFL for tasks assessed    Cervical / Trunk Assessment Cervical / Trunk Assessment: Normal  Communication   Communication: No difficulties  Cognition  Arousal/Alertness: Awake/alert Behavior During Therapy: WFL for tasks assessed/performed Overall Cognitive Status: Within Functional Limits for tasks assessed                                        General Comments      Exercises Other Exercises Other Exercises: Encouraged pt to ambualte in hallway with nursing staff at least 3x/day   Assessment/Plan    PT Assessment Patent does not need any further PT services  PT Problem List         PT Treatment Interventions      PT Goals (Current goals can be found in the Care Plan section)  Acute Rehab PT Goals Patient Stated Goal: to go home PT Goal Formulation: All assessment and education complete, DC therapy    Frequency     Barriers to discharge        Co-evaluation               AM-PAC PT "6 Clicks" Daily Activity  Outcome Measure Difficulty turning over in bed (including adjusting bedclothes, sheets and blankets)?: None Difficulty moving from lying on back to sitting on the side of the bed? : None Difficulty sitting down on and standing up from a chair with arms (e.g., wheelchair, bedside commode, etc,.)?: None Help needed moving to and from a bed to chair (including a wheelchair)?: None Help needed walking in hospital room?: None Help needed climbing 3-5 steps with a railing? : None 6 Click Score: 24    End of Session Equipment Utilized During Treatment: Gait belt Activity Tolerance: Patient tolerated treatment well Patient left: in bed;with call bell/phone within reach;with bed alarm set Nurse Communication: Mobility status PT Visit Diagnosis: Unsteadiness on feet (R26.81)    Time: 0712-1975 PT Time Calculation (min) (ACUTE ONLY): 12 min   Charges:   PT Evaluation $PT Eval Low Complexity: 1 Low     PT G Codes:   PT G-Codes **NOT FOR INPATIENT CLASS** Functional Assessment Tool Used: AM-PAC 6 Clicks Basic Mobility;Clinical judgement Functional Limitation: Mobility: Walking and moving  around Mobility: Walking and Moving Around Current Status (O8325): 0 percent impaired, limited or restricted Mobility: Walking and Moving Around Goal Status (Q9826): 0 percent impaired, limited or restricted Mobility: Walking and Moving Around Discharge Status (E1583): 0 percent impaired, limited or restricted    Collie Siad PT, DPT 09/09/2016, 1:23 PM

## 2016-09-09 NOTE — Care Management Obs Status (Signed)
[facility] NOTIFICATION   Patient Details  Name: Jacqueline Rodriguez MRN: [id number] Date of Birth: 1960-09-10   Medicare Observation Status Notification Given:  No (admitted obs less than 24 hours)    Beverly Sessions, RN 09/09/2016, 2:06 PM

## 2016-09-09 NOTE — Progress Notes (Signed)
[facility]  ROUNDING NOTE   Subjective:  Patient well known to [place] as an outpatient. She is currently undergoing hemodialysis on Monday, Wednesday, Friday. However she will be transitioning to peritoneal dialysis soon. She last went to dialysis on Tuesday as she did not feel well on Monday. On the day of admission the patient was having some dizziness and not feeling herself. EMS was called and patient was found to be relatively hypoglycemic with a blood sugar of 68.  Objective:  Vital signs in last 24 hours:  Temp:  [97.4 F (36.3 C)-98.2 F (36.8 C)] 97.5 F (36.4 C) (08/30 0700) Pulse Rate:  [46-69] 51 (08/30 0700) Resp:  [16-20] 16 (08/30 0454) BP: (131-162)/(42-72) 162/46 (08/30 0700) SpO2:  [96 %-100 %] 96 % (08/30 0700) Weight:  [99.3 kg (219 lb)] 99.3 kg (219 lb) (08/29 1640)  Weight change:  Filed Weights   09/08/16 1212 09/08/16 1640  Weight: 99.3 kg (219 lb) 99.3 kg (219 lb)    Intake/Output: I/O last 3 completed shifts: In: 240 [P.O.:240] Out: 100 [Urine:100]   Intake/Output this shift:  Total I/O In: 240 [P.O.:240] Out: -   Physical Exam: General: No acute distress  Head: Normocephalic, atraumatic. Moist oral mucosal membranes  Eyes: Anicteric  Neck: Supple, trachea midline  Lungs:  Clear to auscultation, normal effort  Heart: S1S2 no rubs  Abdomen:  Soft, nontender, bowel sounds present  Extremities: Trace peripheral edema.  Neurologic: Awake, alert, strength 5/5 in both upper and lower extremeties  Skin: No lesions  Access: PD catheter in place, LUE access    Basic Metabolic Panel:  Recent Labs Lab 09/08/16 1227  NA 129*  K 5.0  CL 98*  CO2 18*  GLUCOSE 122*  BUN 61*  CREATININE 8.48*  CALCIUM 7.6*  MG 1.9    Liver Function Tests: No results for input(s): AST, ALT, ALKPHOS, BILITOT, PROT, ALBUMIN in the last 168 hours. No results for input(s): LIPASE, AMYLASE in the last 168 hours. No results for input(s): AMMONIA in  the last 168 hours.  CBC:  Recent Labs Lab 09/08/16 1227  WBC 6.1  HGB 9.9*  HCT 29.1*  MCV 95.0  PLT 153    Cardiac Enzymes: No results for input(s): CKTOTAL, CKMB, CKMBINDEX, TROPONINI in the last 168 hours.  BNP: Invalid input(s): POCBNP  CBG:  Recent Labs Lab 09/08/16 1504 09/08/16 1608 09/08/16 1635 09/08/16 2123 09/09/16 0734  GLUCAP 94 101* 98 124* 56    Microbiology: Results for orders placed or performed during the hospital encounter of 09/08/16  MRSA PCR Screening     Status: None   Collection Time: 09/08/16  6:05 PM  Result Value Ref Range Status   MRSA by PCR NEGATIVE NEGATIVE Final    Comment:        The GeneXpert MRSA Assay (FDA approved for NASAL specimens only), is one component of a comprehensive MRSA colonization surveillance program. It is not intended to diagnose MRSA infection nor to guide or monitor treatment for MRSA infections.     Coagulation Studies: No results for input(s): LABPROT, INR in the last 72 hours.  Urinalysis:  Recent Labs  09/08/16 1227  COLORURINE YELLOW*  LABSPEC 1.008  PHURINE 6.0  GLUCOSEU 50*  HGBUR SMALL*  BILIRUBINUR NEGATIVE  KETONESUR NEGATIVE  PROTEINUR 100*  NITRITE NEGATIVE  LEUKOCYTESUR TRACE*      Imaging: Ct Angio Head W Or Wo Contrast  Result Date: 09/08/2016 CLINICAL DATA:  Persistent vertigo, central. EXAM: CT ANGIOGRAPHY HEAD  AND NECK TECHNIQUE: Multidetector CT imaging of the head and neck was performed using the standard protocol during bolus administration of intravenous contrast. Multiplanar CT image reconstructions and MIPs were obtained to evaluate the vascular anatomy. Carotid stenosis measurements (when applicable) are obtained utilizing NASCET criteria, using the distal internal carotid diameter as the denominator. CONTRAST:  75 cc Isovue 370 intravenous COMPARISON:  None. FINDINGS: CT head: Brain: No evidence of infarct, hemorrhage, hydrocephalus, or masslike finding.  Vascular: See below. Skull: Negative. Sinuses and orbits: Negative. CTA NECK FINDINGS Aortic arch: Atherosclerotic plaque on the aorta. Three vessel branching. No acute finding. Right carotid system: Mild partially calcified plaque at the common carotid bifurcation. No stenosis, ulceration, or dissection. Left carotid system: Prominent plaque at the CCA origin with ~50% luminal stenosis. Moderate peripherally calcified centrally low-density plaque at the common carotid bifurcation with 25% proximal ICA narrowing. No definite ulceration. There is mild motion artifact at the level of the left CCA bifurcation. Mild proximal ECA narrowing. Vertebral arteries: No proximal subclavian stenosis. Both vertebral arteries are smooth and widely patent to the dura. Skeleton: No acute or aggressive finding. Other neck: No incidental mass or inflammation noted. Upper chest: Chronic scar-like opacity at the left apex with calcification, also seen on chest CT 01/08/2015. Hazy appearance of the upper lungs which could be from atelectasis. Fissural fluid in the upper left major fissure. Chest x-ray performed today. Review of the MIP images confirms the above findings CTA HEAD FINDINGS Anterior circulation: Atherosclerotic plaque on the carotid siphons without stenosis or ulceration. No branch occlusion, beading, or aneurysm. Posterior circulation: Essentially codominant vertebral arteries. Vertebral and basilar arteries are smooth and widely patent Venous sinuses: Patent Anatomic variants: None significant Delayed phase: No abnormal intracranial enhancement. Review of the MIP images confirms the above findings IMPRESSION: 1. No acute finding.  No evidence of vertebrobasilar insufficiency. 2. Atherosclerosis, most notable in the left carotid circulation where there is 50% common carotid origins stenosis and 25% proximal ICA stenosis. 3. Electronically Signed   By: Monte Fantasia M.D.   On: 09/08/2016 14:27   Dg Chest 2  View  Result Date: 09/08/2016 CLINICAL DATA:  Hypoglycemia earlier today. History of dialysis dependent renal failure, CHF, COPD, former smoker. EXAM: CHEST  2 VIEW COMPARISON:  Chest x-ray of July 17, 2015 FINDINGS: The lungs are well-expanded. The interstitial markings are mildly increased. The pulmonary vascularity is mildly engorged. The cardiac silhouette is enlarged. The left hemidiaphragm is obscured. There is calcification in the wall of the aortic arch. There is some fluid in the major fissure presumably on the left. No significant costophrenic angle angle blunting is observed. IMPRESSION: Mild CHF.  No alveolar pneumonia nor large pleural effusion. Thoracic aortic atherosclerosis. Electronically Signed   By: David  Martinique M.D.   On: 09/08/2016 13:40   Ct Angio Neck W And/or Wo Contrast  Result Date: 09/08/2016 CLINICAL DATA:  Persistent vertigo, central. EXAM: CT ANGIOGRAPHY HEAD AND NECK TECHNIQUE: Multidetector CT imaging of the head and neck was performed using the standard protocol during bolus administration of intravenous contrast. Multiplanar CT image reconstructions and MIPs were obtained to evaluate the vascular anatomy. Carotid stenosis measurements (when applicable) are obtained utilizing NASCET criteria, using the distal internal carotid diameter as the denominator. CONTRAST:  75 cc Isovue 370 intravenous COMPARISON:  None. FINDINGS: CT head: Brain: No evidence of infarct, hemorrhage, hydrocephalus, or masslike finding. Vascular: See below. Skull: Negative. Sinuses and orbits: Negative. CTA NECK FINDINGS Aortic arch: Atherosclerotic plaque on the  aorta. Three vessel branching. No acute finding. Right carotid system: Mild partially calcified plaque at the common carotid bifurcation. No stenosis, ulceration, or dissection. Left carotid system: Prominent plaque at the CCA origin with ~50% luminal stenosis. Moderate peripherally calcified centrally low-density plaque at the common carotid  bifurcation with 25% proximal ICA narrowing. No definite ulceration. There is mild motion artifact at the level of the left CCA bifurcation. Mild proximal ECA narrowing. Vertebral arteries: No proximal subclavian stenosis. Both vertebral arteries are smooth and widely patent to the dura. Skeleton: No acute or aggressive finding. Other neck: No incidental mass or inflammation noted. Upper chest: Chronic scar-like opacity at the left apex with calcification, also seen on chest CT 01/08/2015. Hazy appearance of the upper lungs which could be from atelectasis. Fissural fluid in the upper left major fissure. Chest x-ray performed today. Review of the MIP images confirms the above findings CTA HEAD FINDINGS Anterior circulation: Atherosclerotic plaque on the carotid siphons without stenosis or ulceration. No branch occlusion, beading, or aneurysm. Posterior circulation: Essentially codominant vertebral arteries. Vertebral and basilar arteries are smooth and widely patent Venous sinuses: Patent Anatomic variants: None significant Delayed phase: No abnormal intracranial enhancement. Review of the MIP images confirms the above findings IMPRESSION: 1. No acute finding.  No evidence of vertebrobasilar insufficiency. 2. Atherosclerosis, most notable in the left carotid circulation where there is 50% common carotid origins stenosis and 25% proximal ICA stenosis. 3. Electronically Signed   By: Monte Fantasia M.D.   On: 09/08/2016 14:27   Mr Brain Wo Contrast  Result Date: 09/08/2016 CLINICAL DATA:  Numbness or tingling. Paresthesias. Central vertigo. Hypoglycemia. EXAM: MRI HEAD WITHOUT CONTRAST TECHNIQUE: Multiplanar, multiecho pulse sequences of the brain and surrounding structures were obtained without intravenous contrast. COMPARISON:  CTA head and neck from the same day. FINDINGS: Brain: No acute infarct, hemorrhage, or mass lesion is present. The ventricles are of normal size. Study is mildly degraded by patient  motion. Periventricular T2 changes are mildly advanced for age. White matter changes extend into the brainstem. The cerebellum is unremarkable. The internal auditory canals are within normal limits bilaterally. Vascular: Flow is present in the major intracranial arteries. Skull and upper cervical spine: The skullbase is within normal limits. The craniocervical junction is unremarkable. Midline sagittal structures are within normal limits. Sinuses/Orbits: The paranasal sinuses and mastoid air cells are clear. The globes and orbits are within normal limits. IMPRESSION: 1. Periventricular white matter changes are mildly advanced for age. White matter changes extend into the brainstem. This likely reflects the sequela of chronic microvascular ischemia. The finding is nonspecific. 2. No other acute or focal lesion to explain the patient's numbness or central vertigo. Electronically Signed   By: San Morelle M.D.   On: 09/08/2016 16:25     Medications:   . cefTRIAXone (ROCEPHIN) 1 g IVPB     .  stroke: mapping our early stages of recovery book   Does not apply Once  . amLODipine  5 mg Oral Daily  . aspirin  162 mg Oral QHS  . calcium-vitamin D  1 tablet Oral Daily  . carvedilol  3.125 mg Oral BID WC  . ferrous sulfate  325 mg Oral BID  . heparin  5,000 Units Subcutaneous Q8H  . insulin aspart  0-9 Units Subcutaneous TID WC  . isosorbide mononitrate  30 mg Oral Daily  . lisinopril  10 mg Oral Daily  . loratadine  10 mg Oral Daily  . magnesium oxide  200 mg  Oral Daily  . ondansetron (ZOFRAN) IV  4 mg Intravenous Once   acetaminophen **OR** acetaminophen (TYLENOL) oral liquid 160 mg/5 mL **OR** acetaminophen, albuterol, gabapentin, HYDROcodone-acetaminophen, iopamidol, meclizine, Melatonin  Assessment/ Plan:  56 y.o. female with diastolic congestive heart failure, hypertension, diabetes mellitus type II, diabetic retinopathy, diabetic neuropathy, ESRD on HD MWF, anemia of CKD,  SHPTH  [facility]/MWF/[place]  1. Hypoglycemia/altered mental status. Patient reports that she wasn't feeling herself and did appear a bit confused. When EMS was called blood sugar was 68. She feels better now and seems to be back to her baseline.  Blood glucose this a.m. was 122.  Workup thus far has been negative. MRI brain was negative for an acute stroke. Chronic microvascular change has been noted. Otherwise disposition as per hospitalist.  No acute indication for dialysis at the moment.    LOS: 0 Borden Thune 8/30/201810:59 AM

## 2016-09-09 NOTE — Discharge Instructions (Signed)
Stopped Glipizide and Coreg. Follow with your doctor in 1-2 weeks. Check Blood sugar level twice daily.

## 2016-09-09 NOTE — Discharge Summary (Signed)
[facility] NAME: Jacqueline Rodriguez    MR#:  [id number]  DATE OF BIRTH:  [birth year]  DATE OF ADMISSION:  09/08/2016 ADMITTING PHYSICIAN: Fritzi Mandes, MD  DATE OF DISCHARGE: 09/09/2016  PRIMARY CARE PHYSICIAN: [facility]    ADMISSION DIAGNOSIS:  Numbness [R20.0] Hyponatremia [E87.1] Weakness [R53.1] Hypoglycemia [E16.2] Acute cystitis without hematuria [N30.00] Acute nonintractable headache, unspecified headache type [R51]  DISCHARGE DIAGNOSIS:  Active Problems:   Syncope   Hypoglycemia  SECONDARY DIAGNOSIS:   Past Medical History:  Diagnosis Date  . Anemia   . Chronic bronchitis ([place])   . Chronic diastolic CHF (congestive heart failure) ([place])   . CKD (chronic kidney disease), stage III   . COPD (chronic obstructive pulmonary disease) ([place])   . Diabetes mellitus with renal complications ([place])   . Essential hypertension   . GERD (gastroesophageal reflux disease)   . Obesity   . Peripheral vascular disease ([place])   . Pulmonary hypertension ([place])   . Renal insufficiency 09/19/2015   Stage 3 CKD. Beginning dialysis.    HOSPITAL COURSE:   Jacqueline Rodriguez  is a 56 y.o. female with a known history of hypertension, chronic anemia, diabetes, end-stage renal disease on hemodialysis comes to the emergency room after she woke up around 10:30 feeling dizzy lightheaded and almost passed out. Patient called EMS found to have blood sugar of 68  1. Dizziness/numbness right upper extremity and near passing out spell likely secondary to hypoglycemia transient and also bradycardia -Patient feels a whole lot better  -CT headand neck  negative for stroke. - Negative MRI of the head -Continue aspirin - Advised to stop taking glipizide and keep blood sugar check twice daily. - HR noted in 50's on tele, stopped coreg.  2. End-stage renal disease on hemodialysis -Nephrology consultation for hemodialysis -Patient recently  had a PD catheter placed about a month ago and is in the process of getting it started soon  - no need for HD as inpatient.  3. Hypertension resume all home meds except coreg.  4. Hyperglycemia in the setting of history of type 2 diabetes Hold glipizide Sliding scale insulin Bedtime snack  5. DVT prophylaxis subcutaneous heparin  DISCHARGE CONDITIONS:   Stable.  CONSULTS OBTAINED:  Treatment Team:  Catarina Hartshorn, MD Anthonette Legato, MD  DRUG ALLERGIES:   Allergies  Allergen Reactions  . Ambien [Zolpidem] Other (See Comments)    hallucinations    DISCHARGE MEDICATIONS:   Current Discharge Medication List    CONTINUE these medications which have NOT CHANGED   Details  acetaminophen (TYLENOL) 500 MG tablet Take 500 mg by mouth every 6 (six) hours as needed.    albuterol (PROVENTIL HFA;VENTOLIN HFA) 108 (90 Base) MCG/ACT inhaler Inhale 2 puffs into the lungs every 6 (six) hours as needed for wheezing or shortness of breath. Qty: 1 Inhaler, Refills: 2    amLODipine (NORVASC) 5 MG tablet Take 1 tablet (5 mg total) by mouth daily. Qty: 30 tablet, Refills: 0    aspirin EC 81 MG EC tablet Take 1 tablet (81 mg total) by mouth daily. Qty: 30 tablet, Refills: 0    Calcium-Magnesium-Vitamin D (CALCIUM 1200+D3 PO) Take 1 tablet by mouth daily.    cetirizine (ZYRTEC) 10 MG tablet Take 10 mg by mouth daily as needed for allergies.    ferrous sulfate 325 (65 FE) MG tablet Take 325 mg by mouth 2 (two) times daily.    gabapentin (NEURONTIN) 100  MG capsule Take 100 mg by mouth 3 (three) times daily as needed. For neuropathy. Refills: 2    gentamicin cream (GARAMYCIN) 0.1 % Apply 1 application topically daily. Refills: 99    GLUCOSAMINE-CHONDROITIN PO Take 4,000 mg by mouth daily.    hydrALAZINE (APRESOLINE) 25 MG tablet Take 25 mg by mouth 3 (three) times daily.    isosorbide mononitrate (IMDUR) 30 MG 24 hr tablet Take 30 mg by mouth daily.    lisinopril  (PRINIVIL,ZESTRIL) 10 MG tablet Take 10 mg by mouth daily.     meclizine (ANTIVERT) 25 MG tablet Take 1 tablet (25 mg total) by mouth 3 (three) times daily as needed for dizziness. Qty: 30 tablet, Refills: 0    furosemide (LASIX) 20 MG tablet Take 20 mg by mouth daily.    !! HYDROcodone-acetaminophen (NORCO) 5-325 MG tablet Take 1 tablet by mouth every 6 (six) hours as needed for moderate pain. Qty: 30 tablet, Refills: 0    !! HYDROcodone-acetaminophen (NORCO) 5-325 MG tablet Take 1 tablet by mouth every 6 (six) hours as needed for moderate pain. Qty: 30 tablet, Refills: 0    Magnesium Oxide 250 MG TABS Take 1 tablet by mouth daily.     !! - Potential duplicate medications found. Please discuss with provider.    STOP taking these medications     carvedilol (COREG) 6.25 MG tablet      glipiZIDE (GLUCOTROL) 5 MG tablet          DISCHARGE INSTRUCTIONS:    Follow with PMD in 1-2 weeks.  If you experience worsening of your admission symptoms, develop shortness of breath, life threatening emergency, suicidal or homicidal thoughts you must seek medical attention immediately by calling 911 or calling your MD immediately  if symptoms less severe.  You Must read complete instructions/literature along with all the possible adverse reactions/side effects for all the Medicines you take and that have been prescribed to you. Take any new Medicines after you have completely understood and accept all the possible adverse reactions/side effects.   Please note  You were cared for by a hospitalist during your hospital stay. If you have any questions about your discharge medications or the care you received while you were in the hospital after you are discharged, you can call the unit and asked to speak with the hospitalist on call if the hospitalist that took care of you is not available. Once you are discharged, your primary care physician will handle any further medical issues. Please note that  NO REFILLS for any discharge medications will be authorized once you are discharged, as it is imperative that you return to your primary care physician (or establish a relationship with a primary care physician if you do not have one) for your aftercare needs so that they can reassess your need for medications and monitor your lab values.    Today   CHIEF COMPLAINT:   Chief Complaint  Patient presents with  . Dizziness  . Emesis    HISTORY OF PRESENT ILLNESS:  Jacqueline Rodriguez  is a 56 y.o. female with a known history of hypertension, chronic anemia, diabetes, end-stage renal disease on hemodialysis comes to the emergency room after she woke up around 10:30 feeling dizzy lightheaded and almost passed out. Patient called EMS found to have blood sugar of 68. They gave her dextrose half and patient felt better brought to the emergency room where CT head and neck angiogramwere negative. Per ER physician patient had some numbness in the  right upper and lower extremity which is resolved she is being admitted with dizziness/presyncope/possible rule out stroke/hypoglycemia transient.  VITAL SIGNS:  Blood pressure (!) 150/62, pulse 63, temperature 98 F (36.7 C), temperature source Oral, resp. rate 16, height 5\' 6"  (1.676 m), weight 99.3 kg (219 lb), last menstrual period 09/18/2013, SpO2 99 %.  I/O:   Intake/Output Summary (Last 24 hours) at 09/09/16 1401 Last data filed at 09/09/16 1343  Gross per 24 hour  Intake              720 ml  Output              100 ml  Net              620 ml    PHYSICAL EXAMINATION:  GENERAL:  56 y.o.-year-old patient lying in the bed with no acute distress.  EYES: Pupils equal, round, reactive to light and accommodation. No scleral icterus. Extraocular muscles intact.  HEENT: Head atraumatic, normocephalic. Oropharynx and nasopharynx clear.  NECK:  Supple, no jugular venous distention. No thyroid enlargement, no tenderness.  LUNGS: Normal breath sounds bilaterally,  no wheezing, rales,rhonchi or crepitation. No use of accessory muscles of respiration.  CARDIOVASCULAR: S1, S2 normal. No murmurs, rubs, or gallops.  ABDOMEN: Soft, non-tender, non-distended. Bowel sounds present. No organomegaly or mass.  EXTREMITIES: No pedal edema, cyanosis, or clubbing.  NEUROLOGIC: Cranial nerves II through XII are intact. Muscle strength 5/5 in all extremities. Sensation intact. Gait not checked.  PSYCHIATRIC: The patient is alert and oriented x 3.  SKIN: No obvious rash, lesion, or ulcer.   DATA REVIEW:   CBC  Recent Labs Lab 09/08/16 1227  WBC 6.1  HGB 9.9*  HCT 29.1*  PLT 153    Chemistries   Recent Labs Lab 09/08/16 1227  NA 129*  K 5.0  CL 98*  CO2 18*  GLUCOSE 122*  BUN 61*  CREATININE 8.48*  CALCIUM 7.6*  MG 1.9    Cardiac Enzymes No results for input(s): TROPONINI in the last 168 hours.  Microbiology Results  Results for orders placed or performed during the hospital encounter of 09/08/16  MRSA PCR Screening     Status: None   Collection Time: 09/08/16  6:05 PM  Result Value Ref Range Status   MRSA by PCR NEGATIVE NEGATIVE Final    Comment:        The GeneXpert MRSA Assay (FDA approved for NASAL specimens only), is one component of a comprehensive MRSA colonization surveillance program. It is not intended to diagnose MRSA infection nor to guide or monitor treatment for MRSA infections.     RADIOLOGY:  Ct Angio Head W Or Wo Contrast  Result Date: 09/08/2016 CLINICAL DATA:  Persistent vertigo, central. EXAM: CT ANGIOGRAPHY HEAD AND NECK TECHNIQUE: Multidetector CT imaging of the head and neck was performed using the standard protocol during bolus administration of intravenous contrast. Multiplanar CT image reconstructions and MIPs were obtained to evaluate the vascular anatomy. Carotid stenosis measurements (when applicable) are obtained utilizing NASCET criteria, using the distal internal carotid diameter as the denominator.  CONTRAST:  75 cc Isovue 370 intravenous COMPARISON:  None. FINDINGS: CT head: Brain: No evidence of infarct, hemorrhage, hydrocephalus, or masslike finding. Vascular: See below. Skull: Negative. Sinuses and orbits: Negative. CTA NECK FINDINGS Aortic arch: Atherosclerotic plaque on the aorta. Three vessel branching. No acute finding. Right carotid system: Mild partially calcified plaque at the common carotid bifurcation. No stenosis, ulceration, or dissection. Left carotid system: Prominent  plaque at the CCA origin with ~50% luminal stenosis. Moderate peripherally calcified centrally low-density plaque at the common carotid bifurcation with 25% proximal ICA narrowing. No definite ulceration. There is mild motion artifact at the level of the left CCA bifurcation. Mild proximal ECA narrowing. Vertebral arteries: No proximal subclavian stenosis. Both vertebral arteries are smooth and widely patent to the dura. Skeleton: No acute or aggressive finding. Other neck: No incidental mass or inflammation noted. Upper chest: Chronic scar-like opacity at the left apex with calcification, also seen on chest CT 01/08/2015. Hazy appearance of the upper lungs which could be from atelectasis. Fissural fluid in the upper left major fissure. Chest x-ray performed today. Review of the MIP images confirms the above findings CTA HEAD FINDINGS Anterior circulation: Atherosclerotic plaque on the carotid siphons without stenosis or ulceration. No branch occlusion, beading, or aneurysm. Posterior circulation: Essentially codominant vertebral arteries. Vertebral and basilar arteries are smooth and widely patent Venous sinuses: Patent Anatomic variants: None significant Delayed phase: No abnormal intracranial enhancement. Review of the MIP images confirms the above findings IMPRESSION: 1. No acute finding.  No evidence of vertebrobasilar insufficiency. 2. Atherosclerosis, most notable in the left carotid circulation where there is 50% common  carotid origins stenosis and 25% proximal ICA stenosis. 3. Electronically Signed   By: Monte Fantasia M.D.   On: 09/08/2016 14:27   Dg Chest 2 View  Result Date: 09/08/2016 CLINICAL DATA:  Hypoglycemia earlier today. History of dialysis dependent renal failure, CHF, COPD, former smoker. EXAM: CHEST  2 VIEW COMPARISON:  Chest x-ray of July 17, 2015 FINDINGS: The lungs are well-expanded. The interstitial markings are mildly increased. The pulmonary vascularity is mildly engorged. The cardiac silhouette is enlarged. The left hemidiaphragm is obscured. There is calcification in the wall of the aortic arch. There is some fluid in the major fissure presumably on the left. No significant costophrenic angle angle blunting is observed. IMPRESSION: Mild CHF.  No alveolar pneumonia nor large pleural effusion. Thoracic aortic atherosclerosis. Electronically Signed   By: David  Martinique M.D.   On: 09/08/2016 13:40   Ct Angio Neck W And/or Wo Contrast  Result Date: 09/08/2016 CLINICAL DATA:  Persistent vertigo, central. EXAM: CT ANGIOGRAPHY HEAD AND NECK TECHNIQUE: Multidetector CT imaging of the head and neck was performed using the standard protocol during bolus administration of intravenous contrast. Multiplanar CT image reconstructions and MIPs were obtained to evaluate the vascular anatomy. Carotid stenosis measurements (when applicable) are obtained utilizing NASCET criteria, using the distal internal carotid diameter as the denominator. CONTRAST:  75 cc Isovue 370 intravenous COMPARISON:  None. FINDINGS: CT head: Brain: No evidence of infarct, hemorrhage, hydrocephalus, or masslike finding. Vascular: See below. Skull: Negative. Sinuses and orbits: Negative. CTA NECK FINDINGS Aortic arch: Atherosclerotic plaque on the aorta. Three vessel branching. No acute finding. Right carotid system: Mild partially calcified plaque at the common carotid bifurcation. No stenosis, ulceration, or dissection. Left carotid system:  Prominent plaque at the CCA origin with ~50% luminal stenosis. Moderate peripherally calcified centrally low-density plaque at the common carotid bifurcation with 25% proximal ICA narrowing. No definite ulceration. There is mild motion artifact at the level of the left CCA bifurcation. Mild proximal ECA narrowing. Vertebral arteries: No proximal subclavian stenosis. Both vertebral arteries are smooth and widely patent to the dura. Skeleton: No acute or aggressive finding. Other neck: No incidental mass or inflammation noted. Upper chest: Chronic scar-like opacity at the left apex with calcification, also seen on chest CT 01/08/2015. Hazy appearance of  the upper lungs which could be from atelectasis. Fissural fluid in the upper left major fissure. Chest x-ray performed today. Review of the MIP images confirms the above findings CTA HEAD FINDINGS Anterior circulation: Atherosclerotic plaque on the carotid siphons without stenosis or ulceration. No branch occlusion, beading, or aneurysm. Posterior circulation: Essentially codominant vertebral arteries. Vertebral and basilar arteries are smooth and widely patent Venous sinuses: Patent Anatomic variants: None significant Delayed phase: No abnormal intracranial enhancement. Review of the MIP images confirms the above findings IMPRESSION: 1. No acute finding.  No evidence of vertebrobasilar insufficiency. 2. Atherosclerosis, most notable in the left carotid circulation where there is 50% common carotid origins stenosis and 25% proximal ICA stenosis. 3. Electronically Signed   By: Monte Fantasia M.D.   On: 09/08/2016 14:27   Mr Brain Wo Contrast  Result Date: 09/08/2016 CLINICAL DATA:  Numbness or tingling. Paresthesias. Central vertigo. Hypoglycemia. EXAM: MRI HEAD WITHOUT CONTRAST TECHNIQUE: Multiplanar, multiecho pulse sequences of the brain and surrounding structures were obtained without intravenous contrast. COMPARISON:  CTA head and neck from the same day.  FINDINGS: Brain: No acute infarct, hemorrhage, or mass lesion is present. The ventricles are of normal size. Study is mildly degraded by patient motion. Periventricular T2 changes are mildly advanced for age. White matter changes extend into the brainstem. The cerebellum is unremarkable. The internal auditory canals are within normal limits bilaterally. Vascular: Flow is present in the major intracranial arteries. Skull and upper cervical spine: The skullbase is within normal limits. The craniocervical junction is unremarkable. Midline sagittal structures are within normal limits. Sinuses/Orbits: The paranasal sinuses and mastoid air cells are clear. The globes and orbits are within normal limits. IMPRESSION: 1. Periventricular white matter changes are mildly advanced for age. White matter changes extend into the brainstem. This likely reflects the sequela of chronic microvascular ischemia. The finding is nonspecific. 2. No other acute or focal lesion to explain the patient's numbness or central vertigo. Electronically Signed   By: San Morelle M.D.   On: 09/08/2016 16:25    EKG:   Orders placed or performed during the hospital encounter of 09/08/16  . EKG 12-Lead  . EKG 12-Lead  . ED EKG  . ED EKG      Management plans discussed with the patient, family and they are in agreement.  CODE STATUS:     Code Status Orders        Start     Ordered   09/08/16 1631  Full code  Continuous     09/08/16 1631    Code Status History    Date Active Date Inactive Code Status Order ID Comments User Context   07/17/2015 12:44 PM 07/24/2015  7:11 PM Full Code [id number]  Epifanio Lesches, MD ED   03/15/2015  7:56 PM 03/20/2015  3:48 PM Full Code [id number]  Idelle Crouch, MD Inpatient      TOTAL TIME TAKING CARE OF THIS PATIENT: 35 minutes.    Vaughan Basta M.D on 09/09/2016 at 2:01 PM  Between 7am to 6pm - Pager - [phone]  After 6pm go to [email or web address] - password [email or web address]  [facility]  Office  [phone]  CC: Primary care physician; [place], [facility]   Note: This dictation was prepared with Dragon dictation along with smaller phrase technology. Any transcriptional errors that result from this process are unintentional.

## 2016-09-10 LAB — URINE CULTURE

## 2016-09-13 ENCOUNTER — Inpatient Hospital Stay
Admission: EM | Admit: 2016-09-13 | Discharge: 2016-09-17 | DRG: 286 | Disposition: A | Discharge status: Home / Self Care | Payer: Medicare Other | Attending: Internal Medicine | Admitting: Internal Medicine

## 2016-09-13 DIAGNOSIS — E1122 Type 2 diabetes mellitus with diabetic chronic kidney disease: Secondary | ICD-10-CM | POA: Diagnosis not present

## 2016-09-13 DIAGNOSIS — Z79899 Other long term (current) drug therapy: Secondary | ICD-10-CM | POA: Diagnosis not present

## 2016-09-13 DIAGNOSIS — Z7982 Long term (current) use of aspirin: Secondary | ICD-10-CM

## 2016-09-13 DIAGNOSIS — E1151 Type 2 diabetes mellitus with diabetic peripheral angiopathy without gangrene: Secondary | ICD-10-CM | POA: Diagnosis present

## 2016-09-13 DIAGNOSIS — N186 End stage renal disease: Secondary | ICD-10-CM | POA: Diagnosis not present

## 2016-09-13 DIAGNOSIS — I10 Essential (primary) hypertension: Secondary | ICD-10-CM | POA: Diagnosis not present

## 2016-09-13 DIAGNOSIS — E11319 Type 2 diabetes mellitus with unspecified diabetic retinopathy without macular edema: Secondary | ICD-10-CM | POA: Diagnosis present

## 2016-09-13 DIAGNOSIS — K219 Gastro-esophageal reflux disease without esophagitis: Secondary | ICD-10-CM | POA: Diagnosis present

## 2016-09-13 DIAGNOSIS — Z743 Need for continuous supervision: Secondary | ICD-10-CM | POA: Diagnosis not present

## 2016-09-13 DIAGNOSIS — E669 Obesity, unspecified: Secondary | ICD-10-CM | POA: Diagnosis present

## 2016-09-13 DIAGNOSIS — Z789 Other specified health status: Secondary | ICD-10-CM | POA: Diagnosis not present

## 2016-09-13 DIAGNOSIS — E875 Hyperkalemia: Secondary | ICD-10-CM | POA: Diagnosis not present

## 2016-09-13 DIAGNOSIS — Z87891 Personal history of nicotine dependence: Secondary | ICD-10-CM

## 2016-09-13 DIAGNOSIS — Z992 Dependence on renal dialysis: Secondary | ICD-10-CM | POA: Diagnosis not present

## 2016-09-13 DIAGNOSIS — I5032 Chronic diastolic (congestive) heart failure: Secondary | ICD-10-CM | POA: Diagnosis present

## 2016-09-13 DIAGNOSIS — R6889 Other general symptoms and signs: Secondary | ICD-10-CM | POA: Diagnosis not present

## 2016-09-13 DIAGNOSIS — Z9115 Patient's noncompliance with renal dialysis: Secondary | ICD-10-CM | POA: Diagnosis not present

## 2016-09-13 DIAGNOSIS — I120 Hypertensive chronic kidney disease with stage 5 chronic kidney disease or end stage renal disease: Secondary | ICD-10-CM | POA: Diagnosis not present

## 2016-09-13 DIAGNOSIS — J449 Chronic obstructive pulmonary disease, unspecified: Secondary | ICD-10-CM | POA: Diagnosis present

## 2016-09-13 DIAGNOSIS — Z6835 Body mass index (BMI) 35.0-35.9, adult: Secondary | ICD-10-CM | POA: Diagnosis not present

## 2016-09-13 DIAGNOSIS — T82868A Thrombosis of vascular prosthetic devices, implants and grafts, initial encounter: Secondary | ICD-10-CM | POA: Diagnosis not present

## 2016-09-13 DIAGNOSIS — E1140 Type 2 diabetes mellitus with diabetic neuropathy, unspecified: Secondary | ICD-10-CM | POA: Diagnosis present

## 2016-09-13 DIAGNOSIS — N2581 Secondary hyperparathyroidism of renal origin: Secondary | ICD-10-CM | POA: Diagnosis present

## 2016-09-13 DIAGNOSIS — Y832 Surgical operation with anastomosis, bypass or graft as the cause of abnormal reaction of the patient, or of later complication, without mention of misadventure at the time of the procedure: Secondary | ICD-10-CM | POA: Diagnosis present

## 2016-09-13 DIAGNOSIS — E119 Type 2 diabetes mellitus without complications: Secondary | ICD-10-CM | POA: Diagnosis not present

## 2016-09-13 DIAGNOSIS — T85898A Other specified complication of other internal prosthetic devices, implants and grafts, initial encounter: Secondary | ICD-10-CM | POA: Diagnosis not present

## 2016-09-13 DIAGNOSIS — I272 Pulmonary hypertension, unspecified: Secondary | ICD-10-CM | POA: Diagnosis present

## 2016-09-13 DIAGNOSIS — D509 Iron deficiency anemia, unspecified: Secondary | ICD-10-CM | POA: Diagnosis not present

## 2016-09-13 DIAGNOSIS — D631 Anemia in chronic kidney disease: Secondary | ICD-10-CM | POA: Diagnosis present

## 2016-09-13 DIAGNOSIS — T82898A Other specified complication of vascular prosthetic devices, implants and grafts, initial encounter: Secondary | ICD-10-CM | POA: Diagnosis not present

## 2016-09-13 DIAGNOSIS — I132 Hypertensive heart and chronic kidney disease with heart failure and with stage 5 chronic kidney disease, or end stage renal disease: Secondary | ICD-10-CM | POA: Diagnosis present

## 2016-09-13 DIAGNOSIS — R001 Bradycardia, unspecified: Secondary | ICD-10-CM | POA: Diagnosis not present

## 2016-09-13 DIAGNOSIS — A319 Mycobacterial infection, unspecified: Secondary | ICD-10-CM | POA: Diagnosis not present

## 2016-09-13 DIAGNOSIS — N185 Chronic kidney disease, stage 5: Secondary | ICD-10-CM | POA: Diagnosis not present

## 2016-09-13 DIAGNOSIS — T8571XA Infection and inflammatory reaction due to peritoneal dialysis catheter, initial encounter: Secondary | ICD-10-CM | POA: Diagnosis not present

## 2016-09-13 LAB — GLUCOSE, CAPILLARY
GLUCOSE-CAPILLARY: 161 mg/dL — AB (ref 65–99)
GLUCOSE-CAPILLARY: 166 mg/dL — AB (ref 65–99)

## 2016-09-13 LAB — CBC WITH DIFFERENTIAL/PLATELET
Basophils Absolute: 0 10*3/uL (ref 0–0.1)
Basophils Relative: 1 %
EOS ABS: 0.3 10*3/uL (ref 0–0.7)
Eosinophils Relative: 4 %
HEMATOCRIT: 26.1 % — AB (ref 35.0–47.0)
HEMOGLOBIN: 8.6 g/dL — AB (ref 12.0–16.0)
LYMPHS ABS: 0.8 10*3/uL — AB (ref 1.0–3.6)
LYMPHS PCT: 12 %
MCH: 31.6 pg (ref 26.0–34.0)
MCHC: 32.9 g/dL (ref 32.0–36.0)
MCV: 96 fL (ref 80.0–100.0)
Monocytes Absolute: 0.4 10*3/uL (ref 0.2–0.9)
Monocytes Relative: 6 %
NEUTROS PCT: 77 %
Neutro Abs: 5.3 10*3/uL (ref 1.4–6.5)
Platelets: 168 10*3/uL (ref 150–440)
RBC: 2.72 MIL/uL — AB (ref 3.80–5.20)
RDW: 17.7 % — ABNORMAL HIGH (ref 11.5–14.5)
WBC: 6.8 10*3/uL (ref 3.6–11.0)

## 2016-09-13 LAB — BASIC METABOLIC PANEL
ANION GAP: 14 (ref 5–15)
BUN: 93 mg/dL — ABNORMAL HIGH (ref 6–20)
CHLORIDE: 99 mmol/L — AB (ref 101–111)
CO2: 17 mmol/L — ABNORMAL LOW (ref 22–32)
Calcium: 7.2 mg/dL — ABNORMAL LOW (ref 8.9–10.3)
Creatinine, Ser: 12.44 mg/dL — ABNORMAL HIGH (ref 0.44–1.00)
GFR calc non Af Amer: 3 mL/min — ABNORMAL LOW (ref >60)
GFR, EST AFRICAN AMERICAN: 3 mL/min — AB (ref >60)
Glucose, Bld: 170 mg/dL — ABNORMAL HIGH (ref 65–99)
POTASSIUM: 5.7 mmol/L — AB (ref 3.5–5.1)
SODIUM: 130 mmol/L — AB (ref 135–145)

## 2016-09-13 MED ORDER — LORATADINE 10 MG PO TABS: 10.0000 mg | ORAL_TABLET | Freq: Every day | ORAL | Status: DC | PRN

## 2016-09-13 MED ORDER — CALCIUM CHLORIDE 10 % IV SOLN
1.0000 g | Freq: Once | INTRAVENOUS | Status: AC
  Administered 2016-09-13: 1 g via INTRAVENOUS
  Filled 2016-09-13: qty 10

## 2016-09-13 MED ORDER — SODIUM BICARBONATE 8.4 % IV SOLN
50.0000 meq | Freq: Once | INTRAVENOUS | Status: AC
Start: 2016-09-13 — End: 2016-09-13
  Administered 2016-09-13: 50 meq via INTRAVENOUS
  Filled 2016-09-13: qty 50

## 2016-09-13 MED ORDER — HYDRALAZINE HCL 25 MG PO TABS
25.0000 mg | ORAL_TABLET | Freq: Three times a day (TID) | ORAL | Status: DC
  Administered 2016-09-13 – 2016-09-17 (×10): 25 mg via ORAL
  Filled 2016-09-13 (×10): qty 1

## 2016-09-13 MED ORDER — ASPIRIN EC 81 MG PO TBEC
81.0000 mg | DELAYED_RELEASE_TABLET | Freq: Every day | ORAL | Status: DC
  Administered 2016-09-13 – 2016-09-17 (×4): 81 mg via ORAL
  Filled 2016-09-13 (×4): qty 1

## 2016-09-13 MED ORDER — GENTAMICIN SULFATE 0.1 % EX CREA
1.0000 | TOPICAL_CREAM | Freq: Every day | CUTANEOUS | Status: DC
Start: 2016-09-13 — End: 2016-09-17
  Administered 2016-09-14 – 2016-09-15 (×2): 1 via TOPICAL
  Filled 2016-09-13: qty 15

## 2016-09-13 MED ORDER — INSULIN ASPART 100 UNIT/ML ~~LOC~~ SOLN
0.0000 [IU] | Freq: Three times a day (TID) | SUBCUTANEOUS | Status: DC
  Administered 2016-09-14 – 2016-09-16 (×3): 2 [IU] via SUBCUTANEOUS
  Administered 2016-09-16: 1 [IU] via SUBCUTANEOUS
  Administered 2016-09-17: 2 [IU] via SUBCUTANEOUS
  Filled 2016-09-13 (×5): qty 1

## 2016-09-13 MED ORDER — INSULIN ASPART 100 UNIT/ML ~~LOC~~ SOLN
5.0000 [IU] | Freq: Once | SUBCUTANEOUS | Status: AC
  Administered 2016-09-13: 5 [IU] via INTRAVENOUS
  Filled 2016-09-13: qty 1

## 2016-09-13 MED ORDER — DEXTROSE 50 % IV SOLN
1.0000 | Freq: Once | INTRAVENOUS | Status: AC
  Administered 2016-09-13: 50 mL via INTRAVENOUS
  Filled 2016-09-13: qty 50

## 2016-09-13 MED ORDER — TRAZODONE HCL 50 MG PO TABS
25.0000 mg | ORAL_TABLET | Freq: Every evening | ORAL | Status: DC | PRN
  Administered 2016-09-16: 25 mg via ORAL
  Filled 2016-09-13: qty 1

## 2016-09-13 MED ORDER — GABAPENTIN 100 MG PO CAPS
100.0000 mg | ORAL_CAPSULE | Freq: Three times a day (TID) | ORAL | Status: DC
  Administered 2016-09-13 – 2016-09-17 (×11): 100 mg via ORAL
  Filled 2016-09-13 (×11): qty 1

## 2016-09-13 MED ORDER — HEPARIN SODIUM (PORCINE) 5000 UNIT/ML IJ SOLN
5000.0000 [IU] | Freq: Three times a day (TID) | INTRAMUSCULAR | Status: DC
  Administered 2016-09-13 – 2016-09-17 (×12): 5000 [IU] via SUBCUTANEOUS
  Filled 2016-09-13 (×12): qty 1

## 2016-09-13 MED ORDER — ALBUTEROL SULFATE (2.5 MG/3ML) 0.083% IN NEBU: 2.5000 mg | INHALATION_SOLUTION | Freq: Four times a day (QID) | RESPIRATORY_TRACT | Status: DC | PRN

## 2016-09-13 MED ORDER — AMLODIPINE BESYLATE 5 MG PO TABS
5.0000 mg | ORAL_TABLET | Freq: Every day | ORAL | Status: DC
  Administered 2016-09-13 – 2016-09-17 (×4): 5 mg via ORAL
  Filled 2016-09-13 (×4): qty 1

## 2016-09-13 MED ORDER — ONDANSETRON HCL 4 MG PO TABS: 4.0000 mg | ORAL_TABLET | Freq: Four times a day (QID) | ORAL | Status: DC | PRN

## 2016-09-13 MED ORDER — FERROUS SULFATE 325 (65 FE) MG PO TABS
325.0000 mg | ORAL_TABLET | Freq: Two times a day (BID) | ORAL | Status: DC
  Administered 2016-09-13 – 2016-09-17 (×7): 325 mg via ORAL
  Filled 2016-09-13 (×7): qty 1

## 2016-09-13 MED ORDER — ISOSORBIDE MONONITRATE ER 30 MG PO TB24
30.0000 mg | ORAL_TABLET | Freq: Every day | ORAL | Status: DC
  Administered 2016-09-13 – 2016-09-17 (×4): 30 mg via ORAL
  Filled 2016-09-13 (×5): qty 1

## 2016-09-13 MED ORDER — LISINOPRIL 10 MG PO TABS
10.0000 mg | ORAL_TABLET | Freq: Every day | ORAL | Status: DC
  Administered 2016-09-13 – 2016-09-15 (×3): 10 mg via ORAL
  Filled 2016-09-13 (×3): qty 1

## 2016-09-13 MED ORDER — PATIROMER SORBITEX CALCIUM 8.4 G PO PACK
8.4000 g | PACK | Freq: Every day | ORAL | Status: DC
  Administered 2016-09-13 – 2016-09-15 (×3): 8.4 g via ORAL
  Filled 2016-09-13 (×5): qty 4

## 2016-09-13 MED ORDER — HEPARIN 1000 UNIT/ML FOR PERITONEAL DIALYSIS
500.0000 [IU] | INTRAMUSCULAR | Status: DC | PRN
  Filled 2016-09-13: qty 0.5

## 2016-09-13 MED ORDER — ACETAMINOPHEN 325 MG PO TABS
650.0000 mg | ORAL_TABLET | Freq: Four times a day (QID) | ORAL | Status: DC | PRN
  Administered 2016-09-14: 650 mg via ORAL
  Filled 2016-09-13: qty 2

## 2016-09-13 MED ORDER — BISACODYL 5 MG PO TBEC
5.0000 mg | DELAYED_RELEASE_TABLET | Freq: Every day | ORAL | Status: DC | PRN
  Administered 2016-09-15: 5 mg via ORAL
  Filled 2016-09-13: qty 1

## 2016-09-13 MED ORDER — ACETAMINOPHEN 650 MG RE SUPP: 650.0000 mg | Freq: Four times a day (QID) | RECTAL | Status: DC | PRN

## 2016-09-13 MED ORDER — CALCIUM CARBONATE-VITAMIN D 500-200 MG-UNIT PO TABS
1.0000 | ORAL_TABLET | Freq: Every day | ORAL | Status: DC
  Administered 2016-09-13 – 2016-09-17 (×4): 1 via ORAL
  Filled 2016-09-13 (×4): qty 1

## 2016-09-13 MED ORDER — ONDANSETRON HCL 4 MG/2ML IJ SOLN
4.0000 mg | Freq: Four times a day (QID) | INTRAMUSCULAR | Status: DC | PRN
  Administered 2016-09-16 – 2016-09-17 (×2): 4 mg via INTRAVENOUS
  Filled 2016-09-13: qty 2

## 2016-09-13 MED ORDER — MAGNESIUM OXIDE 400 (241.3 MG) MG PO TABS
400.0000 mg | ORAL_TABLET | Freq: Every day | ORAL | Status: DC
  Administered 2016-09-13 – 2016-09-17 (×4): 400 mg via ORAL
  Filled 2016-09-13 (×4): qty 1

## 2016-09-13 MED ORDER — MECLIZINE HCL 25 MG PO TABS
25.0000 mg | ORAL_TABLET | Freq: Three times a day (TID) | ORAL | Status: DC | PRN
  Filled 2016-09-13: qty 1

## 2016-09-13 MED ORDER — DOCUSATE SODIUM 100 MG PO CAPS
100.0000 mg | ORAL_CAPSULE | Freq: Two times a day (BID) | ORAL | Status: DC
  Administered 2016-09-13 – 2016-09-16 (×5): 100 mg via ORAL
  Filled 2016-09-13 (×6): qty 1

## 2016-09-13 MED ORDER — HYDROCODONE-ACETAMINOPHEN 5-325 MG PO TABS
1.0000 | ORAL_TABLET | ORAL | Status: DC | PRN
Start: 2016-09-13 — End: 2016-09-17
  Administered 2016-09-16: 1 via ORAL
  Administered 2016-09-17: 2 via ORAL
  Filled 2016-09-13: qty 1
  Filled 2016-09-13: qty 2

## 2016-09-13 NOTE — ED Provider Notes (Signed)
[facility] Emergency Department Provider Note ____________________________________________   I have reviewed the triage vital signs and the triage nursing note.  HISTORY  Chief Complaint Vascular Access Problem   Historian Patient  HPI Jacqueline Rodriguez is a 56 y.o. female with history of end-stage round disease who typically dialyzes on Monday Wednesday Friday, received her last dialysis last Tuesday. She states that she had missed the past Monday and so she went on Tuesday. The next Wednesday she was admitted to the hospital overnight and discharged on Thursdayand did not go to dialysis on Friday.  She presented today, Monday, Labor Day for dialysis and apparently her back score access was not working and reportedly was "all clots. "Patient states the doctor was called and she was sent to the ED for further evaluation and likely need for new vascular access.  Reports chronic lower extremity edema which is not essentially worse. She reports mild chronic shortness of breath, been on a fairly worse. Denies chest pain.    Past Medical History:  Diagnosis Date  . Anemia   . Chronic bronchitis ([place])   . Chronic diastolic CHF (congestive heart failure) ([place])   . CKD (chronic kidney disease), stage III   . COPD (chronic obstructive pulmonary disease) ([place])   . Diabetes mellitus with renal complications ([place])   . Essential hypertension   . GERD (gastroesophageal reflux disease)   . Obesity   . Peripheral vascular disease ([place])   . Pulmonary hypertension ([place])   . Renal insufficiency 09/19/2015   Stage 3 CKD. Beginning dialysis.    Patient Active Problem List   Diagnosis Date Noted  . Syncope 09/08/2016  . ESRD on dialysis ([place]) 05/25/2016  . Chronic diastolic heart failure ([place]) 07/30/2015  . Acute renal failure superimposed on stage 3 chronic kidney disease ([place]) 07/18/2015  . Diabetes mellitus with renal complications ([place])   . CKD (chronic kidney  disease), stage III   . Pulmonary hypertension ([place])   . Obesity   . Morbid obesity due to excess calories ([place])   . Anemia in chronic renal disease   . Shortness of breath   . Back pain 05/01/2015  . Essential hypertension 03/25/2015  . Diabetes ([place]) 03/25/2015    Past Surgical History:  Procedure Laterality Date  . AV FISTULA PLACEMENT Left 10/01/2015   Procedure: ARTERIOVENOUS (AV) FISTULA CREATION ( BRACHIAL CEPHALIC );  Surgeon: Algernon Huxley, MD;  Location: [facility] ORS;  Service: Vascular;  Laterality: Left;  . CAPD INSERTION N/A 06/10/2016   Procedure: LAPAROSCOPIC INSERTION CONTINUOUS AMBULATORY PERITONEAL DIALYSIS  (CAPD) CATHETER;  Surgeon: Algernon Huxley, MD;  Location: [facility] ORS;  Service: Vascular;  Laterality: N/A;  . PERIPHERAL VASCULAR CATHETERIZATION N/A 07/21/2015   Procedure: Dialysis/Perma Catheter;  Surgeon: Algernon Huxley, MD;  Location: [place] [facility] LAB;  Service: Cardiovascular;  Laterality: N/A;  . PERIPHERAL VASCULAR CATHETERIZATION N/A 12/11/2015   Procedure: Dialysis/Perma Catheter Removal;  Surgeon: Algernon Huxley, MD;  Location: [place] CV LAB;  Service: Cardiovascular;  Laterality: N/A;    Prior to Admission medications   Medication Sig Start Date End Date Taking? Authorizing Provider  acetaminophen (TYLENOL) 500 MG tablet Take 500 mg by mouth every 6 (six) hours as needed.   Yes [provider]  albuterol (PROVENTIL HFA;VENTOLIN HFA) 108 (90 Base) MCG/ACT inhaler Inhale 2 puffs into the lungs every 6 (six) hours as needed for wheezing or shortness of breath. 01/08/15  Yes Gregor Hams, MD  amLODipine [facility])  5 MG tablet Take 1 tablet (5 mg total) by mouth daily. 07/24/15  Yes Vaughan Basta, MD  aspirin EC 81 MG EC tablet Take 1 tablet (81 mg total) by mouth daily. Patient taking differently: Take 162 mg by mouth at bedtime.  03/20/15  Yes Vaughan Basta, MD  Calcium-Magnesium-Vitamin D (CALCIUM 1200+D3 PO) Take 1 tablet by mouth  daily.   Yes [provider]  cetirizine (ZYRTEC) 10 MG tablet Take 10 mg by mouth daily as needed for allergies.   Yes [provider]  ferrous sulfate 325 (65 FE) MG tablet Take 325 mg by mouth 2 (two) times daily.   Yes [provider]  gabapentin (NEURONTIN) 100 MG capsule Take 100 mg by mouth 3 (three) times daily as needed. For neuropathy. 03/05/15  Yes [provider]  gentamicin cream (GARAMYCIN) 0.1 % Apply 1 application topically daily. 06/25/16  Yes [provider]  GLUCOSAMINE-CHONDROITIN PO Take 4,000 mg by mouth daily.   Yes [provider]  hydrALAZINE (APRESOLINE) 25 MG tablet Take 25 mg by mouth 3 (three) times daily.   Yes [provider]  HYDROcodone-acetaminophen (NORCO) 5-325 MG tablet Take 1 tablet by mouth every 6 (six) hours as needed for moderate pain. 06/10/16  Yes Algernon Huxley, MD  isosorbide mononitrate (IMDUR) 30 MG 24 hr tablet Take 30 mg by mouth daily.   Yes [provider]  lisinopril (PRINIVIL,ZESTRIL) 10 MG tablet Take 10 mg by mouth daily.    Yes [provider]  meclizine (ANTIVERT) 25 MG tablet Take 1 tablet (25 mg total) by mouth 3 (three) times daily as needed for dizziness. 12/08/15  Yes Nance Pear, MD  HYDROcodone-acetaminophen (NORCO) 5-325 MG tablet Take 1 tablet by mouth every 6 (six) hours as needed for moderate pain. Patient not taking: Reported on 09/13/2016 10/01/15   Algernon Huxley, MD  Magnesium Oxide 250 MG TABS Take 1 tablet by mouth daily.    [provider]    Allergies  Allergen Reactions  . Ambien [Zolpidem] Other (See Comments)    hallucinations    Family History  Problem Relation Age of Onset  . Hypertension Mother   . Diabetes Mellitus II Mother   . CAD Father   . Hypertension Father   . Heart attack Father     Social History Social History  Substance Use Topics  . Smoking status: Former Smoker    Types: Cigarettes    Quit date:  03/24/1985  . Smokeless tobacco: Never Used  . Alcohol use No    Review of Systems  Constitutional: Negative for fever. Eyes: Negative for visual changes. ENT: Negative for sore throat. Cardiovascular: Negative for chest pain. Respiratory: Negative for shortness of breath. Gastrointestinal: Negative for abdominal pain, vomiting and diarrhea. Genitourinary: Negative for dysuria. Musculoskeletal: Negative for back pain. Skin: Negative for rash. Neurological: Negative for headache.  ____________________________________________   PHYSICAL EXAM:  VITAL SIGNS: ED Triage Vitals  Enc Vitals Group     BP      Pulse      Resp      Temp      Temp src      SpO2      Weight      Height      Head Circumference      Peak Flow      Pain Score      Pain Loc      Pain Edu?      Excl. in [place]?  Constitutional: Alert and oriented. Well appearing and in no distress. HEENT   Head: Normocephalic and atraumatic.      Eyes: Conjunctivae are normal. Pupils equal and round.       Ears:         Nose: No congestion/rhinnorhea.   Mouth/Throat: Mucous membranes are moist.   Neck: No stridor. Cardiovascular/Chest: Normal rate, regular rhythm.  No murmurs, rubs, or gallops. Respiratory: Normal respiratory effort without tachypnea nor retractions. Breath sounds are clear and equal bilaterally. No wheezes/rales/rhonchi. Gastrointestinal: Soft. No distention, no guarding, no rebound. Nontender.  Obese  Genitourinary/rectal:Deferred Musculoskeletal: Nontender with normal range of motion in all extremities. No joint effusions.  No lower extremity tenderness. 2+ lower extremity pitting edema. Neurologic:  Normal speech and language. No gross or focal neurologic deficits are appreciated. Skin:  Skin is warm, dry and intact. No rash noted. Psychiatric: Mood and affect are normal. Speech and behavior are normal. Patient exhibits appropriate insight and  judgment.   ____________________________________________  LABS (pertinent positives/negatives)  Labs Reviewed  BASIC METABOLIC PANEL  CBC WITH DIFFERENTIAL/PLATELET    ____________________________________________    EKG I, Lisa Roca, MD, the attending physician have personally viewed and interpreted all ECGs.  46 bpm.  Appears to be junctional rhythm.  No specific T wave peaking. ____________________________________________  RADIOLOGY All Xrays were viewed by me. Imaging interpreted by Radiologist.  None __________________________________________  PROCEDURES  Procedure(s) performed: None  Critical Care performed: CRITICAL CARE Performed by: Lisa Roca   Total critical care time: 30 minutes  Critical care time was exclusive of separately billable procedures and treating other patients.  Critical care was necessary to treat or prevent imminent or life-threatening deterioration.  Critical care was time spent personally by me on the following activities: development of treatment plan with patient and/or surrogate as well as nursing, discussions with consultants, evaluation of patient's response to treatment, examination of patient, obtaining history from patient or surrogate, ordering and performing treatments and interventions, ordering and review of laboratory studies, ordering and review of radiographic studies, pulse oximetry and re-evaluation of patient's condition.   ____________________________________________   ED COURSE / ASSESSMENT AND PLAN  Pertinent labs & imaging results that were available during my care of the patient were reviewed by me and considered in my medical decision making (see chart for details).   Ms. Rickenbach is not complaining of shortness of breath or trouble breathing, but she has not had dialysis for 6 days now and her access is not working.  Patient will need to be admitted after discussion with Dr. Holley Raring or new vascular access.  Awaiting EKG and laboratory studies from an acute standpoint. Plan will be for patient to be admitted to the hospitalist service.  ECG returned with junctional rhythm, confirm this is new and different from prior EKGs of sinus rhythm. A possibility that this could be related to hyperkalemia patient with high risk for hyperkalemia after having missed her dialysis now for 6 days. I am going to go ahead and treat her for possible emergent hyperkalemia prior to labs being ready given this EKG findings.  Patient given IV calcium and bicarbonate and insulin and glucose.  ED care transferred to Dr. Joni Fears. Awaiting laboratory studies. A party spoken with Dr. Margaretmary Eddy, of the hospitalist team as well as Dr. Holley Raring of nephrology.  Hospitalist awaiting laboratory studies for proceeding further.   CONSULTATIONS:   Nephrology, Dr. Holley Raring by phone. Patient will need to be admitted for vascular access. Hospitalist for admission.  Patient / Family / Caregiver informed of clinical course, medical decision-making process, and agree with plan.  ___________________________________________   FINAL CLINICAL IMPRESSION(S) / ED DIAGNOSES   Final diagnoses:  Problem with vascular access              Note: This dictation was prepared with Dragon dictation. Any transcriptional errors that result from this process are unintentional    Lisa Roca, MD 09/13/16 [phone]

## 2016-09-13 NOTE — Progress Notes (Signed)
Chaplain received and order to visit with pt in room 218. Chaplain provided information for an Advanced Directive.    09/13/16 1950  Clinical Encounter Type  Visited With Patient  Visit Type Initial  Referral From Nurse  Consult/Referral To Chaplain  Spiritual Encounters  Spiritual Needs Other (Comment)

## 2016-09-13 NOTE — H&P (Signed)
[place] [facility] [place] NAME: Jacqueline Rodriguez    MR#:  [id number]  DATE OF BIRTH:  1960-02-13  DATE OF ADMISSION:  09/13/2016  PRIMARY CARE PHYSICIAN: [facility]   REQUESTING/REFERRING PHYSICIAN:Dr.Lord*  CHIEF COMPLAINT:vascular access problem.   Chief Complaint  Patient presents with  . Vascular Access Problem    HISTORY OF PRESENT ILLNESS:  Jacqueline Rodriguez  is a 56 y.o. female with a known history of ESRD on HD Monday,wednesday,fridaycomes in, sent from dialysis center because her dialysis access is not working. Patient last hemodialysis was last Tuesday. Patient got admitted on Wednesday for hyponatremia, hypoglycemia, acute cystitis and discharged on August 30. and patient did not have dialysis after last Tuesday, today went for routine dialysis and sent her here because of access problem. Patient denies shortness of breath. No chest pain. Potassium 5.7. She has peritoneal dialysis catheter, but not started on. It yet.  PAST MEDICAL HISTORY:   Past Medical History:  Diagnosis Date  . Anemia   . Chronic bronchitis ([place])   . Chronic diastolic CHF (congestive heart failure) ([place])   . CKD (chronic kidney disease), stage III   . COPD (chronic obstructive pulmonary disease) ([place])   . Diabetes mellitus with renal complications ([place])   . Essential hypertension   . GERD (gastroesophageal reflux disease)   . Obesity   . Peripheral vascular disease ([place])   . Pulmonary hypertension ([place])   . Renal insufficiency 09/19/2015   Stage 3 CKD. Beginning dialysis.    PAST SURGICAL HISTOIRY:   Past Surgical History:  Procedure Laterality Date  . AV FISTULA PLACEMENT Left 10/01/2015   Procedure: ARTERIOVENOUS (AV) FISTULA CREATION ( BRACHIAL CEPHALIC );  Surgeon: Algernon Huxley, MD;  Location: [facility] ORS;  Service: Vascular;  Laterality: Left;  . CAPD INSERTION N/A 06/10/2016   Procedure: LAPAROSCOPIC INSERTION CONTINUOUS AMBULATORY  PERITONEAL DIALYSIS  (CAPD) CATHETER;  Surgeon: Algernon Huxley, MD;  Location: [facility] ORS;  Service: Vascular;  Laterality: N/A;  . PERIPHERAL VASCULAR CATHETERIZATION N/A 07/21/2015   Procedure: Dialysis/Perma Catheter;  Surgeon: Algernon Huxley, MD;  Location: [place] CV LAB;  Service: Cardiovascular;  Laterality: N/A;  . PERIPHERAL VASCULAR CATHETERIZATION N/A 12/11/2015   Procedure: Dialysis/Perma Catheter Removal;  Surgeon: Algernon Huxley, MD;  Location: [place] CV LAB;  Service: Cardiovascular;  Laterality: N/A;    SOCIAL HISTORY:   Social History  Substance Use Topics  . Smoking status: Former Smoker    Types: Cigarettes    Quit date: 03/24/1985  . Smokeless tobacco: Never Used  . Alcohol use No    FAMILY HISTORY:   Family History  Problem Relation Age of Onset  . Hypertension Mother   . Diabetes Mellitus II Mother   . CAD Father   . Hypertension Father   . Heart attack Father     DRUG ALLERGIES:   Allergies  Allergen Reactions  . Ambien [Zolpidem] Other (See Comments)    hallucinations    REVIEW OF SYSTEMS:  CONSTITUTIONAL: No fever, fatigue or weakness.  EYES: No blurred or double vision.  EARS, NOSE, AND THROAT: No tinnitus or ear pain.  RESPIRATORY: No cough, shortness of breath, wheezing or hemoptysis.  CARDIOVASCULAR: No chest pain, orthopnea, edema.  GASTROINTESTINAL: No nausea, vomiting, diarrhea or abdominal pain.  GENITOURINARY: No dysuria, hematuria.  ENDOCRINE: No polyuria, nocturia,  HEMATOLOGY: No anemia, easy bruising or bleeding SKIN: No rash or lesion. MUSCULOSKELETAL: No joint pain or arthritis.  NEUROLOGIC: No tingling, numbness, weakness.  PSYCHIATRY: No anxiety or depression.   MEDICATIONS AT HOME:   Prior to Admission medications   Medication Sig Start Date End Date Taking? Authorizing Provider  acetaminophen (TYLENOL) 500 MG tablet Take 500 mg by mouth every 6 (six) hours as needed.   Yes [provider]  albuterol  (PROVENTIL HFA;VENTOLIN HFA) 108 (90 Base) MCG/ACT inhaler Inhale 2 puffs into the lungs every 6 (six) hours as needed for wheezing or shortness of breath. 01/08/15  Yes Gregor Hams, MD  amLODipine (NORVASC) 5 MG tablet Take 1 tablet (5 mg total) by mouth daily. 07/24/15  Yes Vaughan Basta, MD  aspirin EC 81 MG EC tablet Take 1 tablet (81 mg total) by mouth daily. Patient taking differently: Take 162 mg by mouth at bedtime.  03/20/15  Yes Vaughan Basta, MD  Calcium-Magnesium-Vitamin D (CALCIUM 1200+D3 PO) Take 1 tablet by mouth daily.   Yes [provider]  cetirizine (ZYRTEC) 10 MG tablet Take 10 mg by mouth daily as needed for allergies.   Yes [provider]  ferrous sulfate 325 (65 FE) MG tablet Take 325 mg by mouth 2 (two) times daily.   Yes [provider]  gabapentin (NEURONTIN) 100 MG capsule Take 100 mg by mouth 3 (three) times daily as needed. For neuropathy. 03/05/15  Yes [provider]  gentamicin cream (GARAMYCIN) 0.1 % Apply 1 application topically daily. 06/25/16  Yes [provider]  GLUCOSAMINE-CHONDROITIN PO Take 4,000 mg by mouth daily.   Yes [provider]  hydrALAZINE (APRESOLINE) 25 MG tablet Take 25 mg by mouth 3 (three) times daily.   Yes [provider]  HYDROcodone-acetaminophen (NORCO) 5-325 MG tablet Take 1 tablet by mouth every 6 (six) hours as needed for moderate pain. 06/10/16  Yes Algernon Huxley, MD  isosorbide mononitrate (IMDUR) 30 MG 24 hr tablet Take 30 mg by mouth daily.   Yes [provider]  lisinopril (PRINIVIL,ZESTRIL) 10 MG tablet Take 10 mg by mouth daily.    Yes [provider]  meclizine (ANTIVERT) 25 MG tablet Take 1 tablet (25 mg total) by mouth 3 (three) times daily as needed for dizziness. 12/08/15  Yes Nance Pear, MD  HYDROcodone-acetaminophen (NORCO) 5-325 MG tablet Take 1 tablet by mouth every 6 (six) hours as needed for moderate pain. Patient  not taking: Reported on 09/13/2016 10/01/15   Algernon Huxley, MD  Magnesium Oxide 250 MG TABS Take 1 tablet by mouth daily.    [provider]      VITAL SIGNS:  Blood pressure (!) 173/55, temperature 97.7 F (36.5 C), resp. rate 16, height 5\' 9"  (1.753 m), last menstrual period 09/18/2013, SpO2 100 %.  PHYSICAL EXAMINATION:  GENERAL:  56 y.o.-year-old patient lying in the bed with no acute distress.  EYES: Pupils equal, round, reactive to light and accommodation. No scleral icterus. Extraocular muscles intact.  HEENT: Head atraumatic, normocephalic. Oropharynx and nasopharynx clear.  NECK:  Supple, no jugular venous distention. No thyroid enlargement, no tenderness.  LUNGS: Normal breath sounds bilaterally, no wheezing, rales,rhonchi or crepitation. No use of accessory muscles of respiration.  CARDIOVASCULAR: S1, S2 normal. No murmurs, rubs, or gallops.  ABDOMEN: Soft, nontender, nondistended. Bowel sounds present. No organomegaly or mass.  EXTREMITIES: No pedal edema, cyanosis, or clubbing.patient has dressing present left AV fistula site.[name]  NEUROLOGIC: Cranial nerves II through XII are intact. Muscle strength 5/5 in all extremities. Sensation intact. Gait not checked.  PSYCHIATRIC: The patient  is alert and oriented x 3.  SKIN: No obvious rash, lesion, or ulcer.   LABORATORY PANEL:   CBC  Recent Labs Lab 09/13/16 1453  WBC 6.8  HGB 8.6*  HCT 26.1*  PLT 168   ------------------------------------------------------------------------------------------------------------------  Chemistries   Recent Labs Lab 09/08/16 1227 09/13/16 1453  NA 129* 130*  K 5.0 5.7*  CL 98* 99*  CO2 18* 17*  GLUCOSE 122* 170*  BUN 61* 93*  CREATININE 8.48* 12.44*  CALCIUM 7.6* 7.2*  MG 1.9  --    ------------------------------------------------------------------------------------------------------------------  Cardiac Enzymes No results for input(s): TROPONINI in the last 168  hours. ------------------------------------------------------------------------------------------------------------------  RADIOLOGY:  No results found.  EKG:   Orders placed or performed during the hospital encounter of 09/13/16  . ED EKG  . ED EKG  EKG showed junctional rhythm with 46 bpm.  IMPRESSION AND PLAN:  56 year old female patient with a dialysis access problems ; admit her to medical floor on off unit telemetry, I spoke with nephrology, Dr. Holley Raring suggested that patient can have peritoneal dialysis starting today. #2 mild hyperkalemia secondary to missed hemodialysis sessions. Patient did receive d calcium, bicarbonate, insulin and dextrose. #2 essential hypertension: Controlled #3 obesity #4 GERD #5 COPD #6 chronic diastolic heart failure #7.diabetes mellitus type 2: Recent episode of hypoglycemia, admission so glipizide was stopped at that time. Continue sliding scale insulin with coverage for now.  All the records are reviewed and case discussed with ED provider. Management plans discussed with the patient, family and they are in agreement.  CODE STATUS:full  TOTAL TIME TAKING CARE OF THIS PATIENT: 55 minutes.    Epifanio Lesches M.D on 09/13/2016 at 4:50 PM  Between 7am to 6pm - Pager - [phone]  After 6pm go to [email or web address] - password [email or web address] [place] [facility]  Office  [phone]  CC: Primary care physician; [place]  Note: This dictation was prepared with Dragon dictation along with smaller phrase technology. Any transcriptional errors that result from this process are unintentional.

## 2016-09-13 NOTE — ED Triage Notes (Signed)
Pt. EMS from dialysis center for fistula problems. Fistula kept clotting at dialysis. Pt. Mon, wed, fri, dialysis. Has been off schedule for about a week. Last full dialysis was Tuesday.

## 2016-09-13 NOTE — Progress Notes (Signed)
PD START

## 2016-09-13 NOTE — Progress Notes (Signed)
PD started w/ no complications. Pt alert, oriented and no c/o.

## 2016-09-13 NOTE — Progress Notes (Signed)
[facility]  ROUNDING NOTE   Subjective:  Patient well known to [place]. She presents now with a clotted dialysis access. She last had dialysis on Tuesday. She also has a peritoneal dialysis catheter in place and has not yet started peritoneal dialysis.   Objective:  Vital signs in last 24 hours:  Temp:  [97.7 F (36.5 C)] 97.7 F (36.5 C) (09/03 1436) Resp:  [16] 16 (09/03 1436) BP: (173)/(55) 173/55 (09/03 1436) SpO2:  [100 %] 100 % (09/03 1436)  Weight change:  There were no vitals filed for this visit.  Intake/Output: No intake/output data recorded.   Intake/Output this shift:  No intake/output data recorded.  Physical Exam: General: No acute distress  Head: Normocephalic, atraumatic. Moist oral mucosal membranes  Eyes: Anicteric  Neck: Supple, trachea midline  Lungs:  Basilar rales, normal effort  Heart: S1S2 no rubs  Abdomen:  Soft, nontender, bowel sounds present  Extremities: 1+ peripheral edema.  Neurologic: Awake, alert, following commands  Skin: No lesions  Access: Clotted left upper extremity AV access, peritoneal dialysis catheter in place     Basic Metabolic Panel:  Recent Labs Lab 09/08/16 1227 09/13/16 1453  NA 129* 130*  K 5.0 5.7*  CL 98* 99*  CO2 18* 17*  GLUCOSE 122* 170*  BUN 61* 93*  CREATININE 8.48* 12.44*  CALCIUM 7.6* 7.2*  MG 1.9  --     Liver Function Tests: No results for input(s): AST, ALT, ALKPHOS, BILITOT, PROT, ALBUMIN in the last 168 hours. No results for input(s): LIPASE, AMYLASE in the last 168 hours. No results for input(s): AMMONIA in the last 168 hours.  CBC:  Recent Labs Lab 09/08/16 1227 09/13/16 1453  WBC 6.1 6.8  NEUTROABS  --  5.3  HGB 9.9* 8.6*  HCT 29.1* 26.1*  MCV 95.0 96.0  PLT 153 168    Cardiac Enzymes: No results for input(s): CKTOTAL, CKMB, CKMBINDEX, TROPONINI in the last 168 hours.  BNP: Invalid input(s): POCBNP  CBG:  Recent Labs Lab 09/08/16 1635 09/08/16 2123  09/09/16 0734 09/09/16 1138 09/13/16 1536  GLUCAP 98 124* 23 104* 161*    Microbiology: Results for orders placed or performed during the hospital encounter of 09/08/16  Urine culture     Status: Abnormal   Collection Time: 09/08/16 12:27 PM  Result Value Ref Range Status   Specimen Description URINE, RANDOM  Final   Special Requests NONE  Final   Culture MULTIPLE SPECIES PRESENT, SUGGEST RECOLLECTION (A)  Final   Report Status 09/10/2016 FINAL  Final  MRSA PCR Screening     Status: None   Collection Time: 09/08/16  6:05 PM  Result Value Ref Range Status   MRSA by PCR NEGATIVE NEGATIVE Final    Comment:        The GeneXpert MRSA Assay (FDA approved for NASAL specimens only), is one component of a comprehensive MRSA colonization surveillance program. It is not intended to diagnose MRSA infection nor to guide or monitor treatment for MRSA infections.     Coagulation Studies: No results for input(s): LABPROT, INR in the last 72 hours.  Urinalysis: No results for input(s): COLORURINE, LABSPEC, PHURINE, GLUCOSEU, HGBUR, BILIRUBINUR, KETONESUR, PROTEINUR, UROBILINOGEN, NITRITE, LEUKOCYTESUR in the last 72 hours.  Invalid input(s): APPERANCEUR    Imaging: No results found.   Medications:    . amLODipine  5 mg Oral Daily  . aspirin  81 mg Oral Daily  . Calcium-Magnesium-Vitamin D   Oral Daily  . docusate sodium  100 mg Oral BID  . ferrous sulfate  325 mg Oral BID  . gabapentin  100 mg Oral TID  . heparin  5,000 Units Subcutaneous Q8H  . hydrALAZINE  25 mg Oral TID  . isosorbide mononitrate  30 mg Oral Daily  . lisinopril  10 mg Oral Daily  . loratadine  10 mg Oral Daily   acetaminophen **OR** acetaminophen, albuterol, bisacodyl, HYDROcodone-acetaminophen, meclizine, ondansetron **OR** ondansetron (ZOFRAN) IV, traZODone  Assessment/ Plan:  56 y.o. female with diastolic congestive heart failure, hypertension, diabetes mellitus type II, diabetic retinopathy,  diabetic neuropathy, ESRD on HD MWF, anemia of CKD, SHPTH  [facility]/MWF/[facility]  1. ESRD on HD MWF. The patient's last dialysis treatment was last Tuesday. She was to be starting peritoneal dialysis training in the relative near future but officially remains on hemodialysis. Her dialysis access is now found to be clotted. We will go ahead and proceed with urgent start peritoneal dialysis at the moment. She could be continued on this as an outpatient as well.  2. Hyperkalemia. This should be treated with peritoneal dialysis. Continue to monitor serum potassium.  3. Anemia of chronic kidney disease.  Hemoglobin currently 8.6. She will need Epogen as an outpatient.  4. Secondary hyperparathyroidism. We plan to monitor serum phosphorus during her hospitalization.  5. Thanks for consultation.   LOS: 0 Jacqueline Rodriguez 9/3/20184:59 PM

## 2016-09-14 DIAGNOSIS — T82868A Thrombosis of vascular prosthetic devices, implants and grafts, initial encounter: Secondary | ICD-10-CM

## 2016-09-14 DIAGNOSIS — I10 Essential (primary) hypertension: Secondary | ICD-10-CM

## 2016-09-14 DIAGNOSIS — N186 End stage renal disease: Secondary | ICD-10-CM

## 2016-09-14 LAB — CBC
HCT: 24.2 % — ABNORMAL LOW (ref 35.0–47.0)
HEMOGLOBIN: 8.1 g/dL — AB (ref 12.0–16.0)
MCH: 31.6 pg (ref 26.0–34.0)
MCHC: 33.6 g/dL (ref 32.0–36.0)
MCV: 94 fL (ref 80.0–100.0)
PLATELETS: 162 10*3/uL (ref 150–440)
RBC: 2.57 MIL/uL — AB (ref 3.80–5.20)
RDW: 17.7 % — ABNORMAL HIGH (ref 11.5–14.5)
WBC: 7.6 10*3/uL (ref 3.6–11.0)

## 2016-09-14 LAB — BASIC METABOLIC PANEL
Anion gap: 14 (ref 5–15)
BUN: 91 mg/dL — ABNORMAL HIGH (ref 6–20)
CALCIUM: 8.5 mg/dL — AB (ref 8.9–10.3)
CO2: 18 mmol/L — AB (ref 22–32)
CREATININE: 12.13 mg/dL — AB (ref 0.44–1.00)
Chloride: 99 mmol/L — ABNORMAL LOW (ref 101–111)
GFR, EST AFRICAN AMERICAN: 3 mL/min — AB (ref >60)
GFR, EST NON AFRICAN AMERICAN: 3 mL/min — AB (ref >60)
Glucose, Bld: 113 mg/dL — ABNORMAL HIGH (ref 65–99)
Potassium: 5.7 mmol/L — ABNORMAL HIGH (ref 3.5–5.1)
SODIUM: 131 mmol/L — AB (ref 135–145)

## 2016-09-14 LAB — GLUCOSE, CAPILLARY
GLUCOSE-CAPILLARY: 97 mg/dL (ref 65–99)
GLUCOSE-CAPILLARY: 254 mg/dL — AB (ref 65–99)
Glucose-Capillary: 109 mg/dL — ABNORMAL HIGH (ref 65–99)
Glucose-Capillary: 161 mg/dL — ABNORMAL HIGH (ref 65–99)

## 2016-09-14 MED ORDER — CALCIUM ACETATE (PHOS BINDER) 667 MG PO CAPS
2001.0000 mg | ORAL_CAPSULE | Freq: Three times a day (TID) | ORAL | Status: DC
  Administered 2016-09-14 – 2016-09-17 (×6): 2001 mg via ORAL
  Filled 2016-09-14 (×7): qty 3

## 2016-09-14 MED ORDER — TOBRAMYCIN SULFATE 80 MG/2ML IJ SOLN
2.5000 mg/kg | INTRAVENOUS | Status: DC | PRN
  Filled 2016-09-14: qty 5.25

## 2016-09-14 MED ORDER — TOBRAMYCIN SULFATE 80 MG/2ML IJ SOLN
1.5000 mg/kg | Freq: Once | INTRAVENOUS | Status: DC
  Filled 2016-09-14: qty 3

## 2016-09-14 MED ORDER — CALCIUM GLUCONATE 10 % IV SOLN
1.0000 g | Freq: Once | INTRAVENOUS | Status: AC
Start: 2016-09-14 — End: 2016-09-14
  Administered 2016-09-14: 1 g via INTRAVENOUS
  Filled 2016-09-14: qty 10

## 2016-09-14 MED ORDER — AZITHROMYCIN 250 MG PO TABS
250.0000 mg | ORAL_TABLET | Freq: Every day | ORAL | Status: DC
  Administered 2016-09-15 – 2016-09-17 (×2): 250 mg via ORAL
  Filled 2016-09-14 (×2): qty 1

## 2016-09-14 MED ORDER — TOBRAMYCIN SULFATE 80 MG/2ML IJ SOLN
2.0000 mg/kg | INTRAVENOUS | Status: DC | PRN
  Filled 2016-09-14: qty 4

## 2016-09-14 NOTE — Progress Notes (Signed)
CCPD initiated without issue.

## 2016-09-14 NOTE — Progress Notes (Signed)
Post CCPD. Patient tolerated procedure well. Exit site care performed. No drainage. Slight reddness. Patient currently has no complaints.

## 2016-09-14 NOTE — Progress Notes (Addendum)
Pharmacy Antibiotic Note  Jacqueline Rodriguez is a 56 y.o. female admitted on 09/13/2016 with abscess at dialysis site.  Pharmacy has been consulted for tobramycin iv dosing.  Plan: tobramycin 2mg /kg iv based on ABW due to obesity  Height: 5\' 9"  (175.3 cm) Weight: 234 lb 8 oz (106.4 kg) IBW/kg (Calculated) : 66.2  Temp (24hrs), Avg:98 F (36.7 C), Min:97.6 F (36.4 C), Max:98.2 F (36.8 C)   Recent Labs Lab 09/08/16 1227 09/13/16 1453 09/14/16 0327  WBC 6.1 6.8 7.6  CREATININE 8.48* 12.44* 12.13*    Estimated Creatinine Clearance: 6.6 mL/min (A) (by C-G formula based on SCr of 12.13 mg/dL (H)).    Allergies  Allergen Reactions  . Ambien [Zolpidem] Other (See Comments)    hallucinations   Tobramycin 210mg  iv x 1 dose   Dosing weight is adjusted body weight of 82kg  Patient is on both peritoneal dialysis and HD. Levels will be drawn randomly or 30 mins prior to HD initially and re-dosed when appropriate.   Re-dose 1.5-2mg /kg would be indicated in pre-HD level < 3-5mg /L or post-HD concentration 2mg /L.    Antimicrobials this admission: 9/4 tobramycin >>    Thank you for allowing pharmacy to be a part of this patient's care.  Jacqueline Rodriguez 09/14/2016 4:13 PM

## 2016-09-14 NOTE — Progress Notes (Signed)
Pre CCPD

## 2016-09-14 NOTE — Progress Notes (Signed)
[facility]  ROUNDING NOTE   Subjective:   Peritoneal dialysis last night. Tolerated first treatment well.   Objective:  Vital signs in last 24 hours:  Temp:  [97.6 F (36.4 C)-98.2 F (36.8 C)] 98.2 F (36.8 C) (09/04 0408) Pulse Rate:  [48-59] 59 (09/04 0408) Resp:  [9-20] 18 (09/04 0408) BP: (104-186)/(44-61) 179/56 (09/04 0408) SpO2:  [97 %-100 %] 100 % (09/04 0408) Weight:  [103.9 kg (229 lb)-106.4 kg (234 lb 8 oz)] 106.4 kg (234 lb 8 oz) (09/04 0500)  Weight change:  Filed Weights   09/13/16 1840 09/14/16 0500  Weight: 103.9 kg (229 lb) 106.4 kg (234 lb 8 oz)    Intake/Output: I/O last 3 completed shifts: In: 120 [P.O.:120] Out: 225 [Urine:225]   Intake/Output this shift:  Total I/O In: 240 [P.O.:240] Out: 187 [Other:187]  Physical Exam: General: No acute distress  Head: Normocephalic, atraumatic. Moist oral mucosal membranes  Eyes: Anicteric  Neck: Supple, trachea midline  Lungs:  Clear   Heart: S1S2 no rubs  Abdomen:  Soft, nontender, bowel sounds present  Extremities: no peripheral edema.  Neurologic: Awake, alert, following commands  Skin: No lesions  Access: +thrill, +bruit left upper extremity AV F peritoneal dialysis catheter in place     Basic Metabolic Panel:  Recent Labs Lab 09/08/16 1227 09/13/16 1453 09/14/16 0327  NA 129* 130* 131*  K 5.0 5.7* 5.7*  CL 98* 99* 99*  CO2 18* 17* 18*  GLUCOSE 122* 170* 113*  BUN 61* 93* 91*  CREATININE 8.48* 12.44* 12.13*  CALCIUM 7.6* 7.2* 8.5*  MG 1.9  --   --     Liver Function Tests: No results for input(s): AST, ALT, ALKPHOS, BILITOT, PROT, ALBUMIN in the last 168 hours. No results for input(s): LIPASE, AMYLASE in the last 168 hours. No results for input(s): AMMONIA in the last 168 hours.  CBC:  Recent Labs Lab 09/08/16 1227 09/13/16 1453 09/14/16 0327  WBC 6.1 6.8 7.6  NEUTROABS  --  5.3  --   HGB 9.9* 8.6* 8.1*  HCT 29.1* 26.1* 24.2*  MCV 95.0 96.0 94.0  PLT 153  168 162    Cardiac Enzymes: No results for input(s): CKTOTAL, CKMB, CKMBINDEX, TROPONINI in the last 168 hours.  BNP: Invalid input(s): POCBNP  CBG:  Recent Labs Lab 09/09/16 1138 09/13/16 1536 09/13/16 2118 09/14/16 0725 09/14/16 1143  GLUCAP 104* 161* 166* 109* 161*    Microbiology: Results for orders placed or performed during the hospital encounter of 09/08/16  Urine culture     Status: Abnormal   Collection Time: 09/08/16 12:27 PM  Result Value Ref Range Status   Specimen Description URINE, RANDOM  Final   Special Requests NONE  Final   Culture MULTIPLE SPECIES PRESENT, SUGGEST RECOLLECTION (A)  Final   Report Status 09/10/2016 FINAL  Final  MRSA PCR Screening     Status: None   Collection Time: 09/08/16  6:05 PM  Result Value Ref Range Status   MRSA by PCR NEGATIVE NEGATIVE Final    Comment:        The GeneXpert MRSA Assay (FDA approved for NASAL specimens only), is one component of a comprehensive MRSA colonization surveillance program. It is not intended to diagnose MRSA infection nor to guide or monitor treatment for MRSA infections.     Coagulation Studies: No results for input(s): LABPROT, INR in the last 72 hours.  Urinalysis: No results for input(s): COLORURINE, LABSPEC, PHURINE, GLUCOSEU, HGBUR, BILIRUBINUR, KETONESUR, PROTEINUR, UROBILINOGEN, NITRITE,  LEUKOCYTESUR in the last 72 hours.  Invalid input(s): APPERANCEUR    Imaging: No results found.   Medications:    . amLODipine  5 mg Oral Daily  . aspirin EC  81 mg Oral Daily  . calcium-vitamin D  1 tablet Oral Daily  . docusate sodium  100 mg Oral BID  . ferrous sulfate  325 mg Oral BID  . gabapentin  100 mg Oral TID  . gentamicin cream  1 application Topical Daily  . heparin  5,000 Units Subcutaneous Q8H  . hydrALAZINE  25 mg Oral TID  . insulin aspart  0-9 Units Subcutaneous TID WC  . isosorbide mononitrate  30 mg Oral Daily  . lisinopril  10 mg Oral Daily  . magnesium oxide   400 mg Oral Daily  . patiromer  8.4 g Oral Daily   acetaminophen **OR** acetaminophen, albuterol, bisacodyl, heparin, HYDROcodone-acetaminophen, loratadine, meclizine, ondansetron **OR** ondansetron (ZOFRAN) IV, traZODone  Assessment/ Plan:  56 y.o. white female with diastolic congestive heart failure, hypertension, diabetes mellitus type II, diabetic retinopathy, diabetic neuropathy, ESRD on HD MWF, anemia of CKD, SHPTH  [facility]/MWF/[facility]  1. ESRD on HD MWF: with hyperkalemia. Transitioned to peritoneal dialysis due to nonfunctioning AVF for hemodialysis. Patient tolerated treatment well.  - Will plan on peritoneal dialysis again tonight: prescription of CCPD 8 hours 4 cycles with 2 liter fills. 8.8% dextrose - Complication with hemodialysis access: consult vascular surgery for angiogram/fistulogram.  - Veltassa for potassium binding  2. Hypertension: elevated.  - amlodipine, hydralazine, imdur, lisinopril.   3. Anemia of chronic kidney disease.  Hemoglobin 8.1 - EPO to be ordered  4. Secondary hyperparathyroidism with hypocalcemia and hyperphosphatemia - restart calcium acetate with meals.   5. Diabetes mellitus type II with chronic kidney disease: Hemoglobin A1c 5.8% on 08/02/16 - sliding scale insulin   LOS: 1 Jacqueline Rodriguez 9/4/20181:49 PM

## 2016-09-14 NOTE — Progress Notes (Signed)
Patient ID: Jacqueline Rodriguez, female   DOB: Oct 22, 1960, 56 y.o.   MRN: [id number]  [facility] PROGRESS NOTE  CARMISHA LARUSSO SWN:[id number] DOB: Dec 22, 1960 DOA: 09/13/2016 PCP: [facility] [place]  HPI/Subjective: Patient sent in because her left arm dialysis fistula was not working. They were able to use the PD catheter last night.  Objective: Vitals:   09/14/16 0408 09/14/16 1300  BP: (!) 179/56 (!) 174/57  Pulse: (!) 59 60  Resp: 18 19  Temp: 98.2 F (36.8 C) 98.1 F (36.7 C)  SpO2: 100% 99%    Filed Weights   09/13/16 1840 09/14/16 0500  Weight: 103.9 kg (229 lb) 106.4 kg (234 lb 8 oz)    ROS: Review of Systems  Constitutional: Negative for chills and fever.  Eyes: Negative for blurred vision.  Respiratory: Negative for cough and shortness of breath.   Cardiovascular: Negative for chest pain.  Gastrointestinal: Negative for abdominal pain, constipation, diarrhea, nausea and vomiting.  Genitourinary: Negative for dysuria.  Musculoskeletal: Negative for joint pain.  Neurological: Negative for dizziness and headaches.   Exam: Physical Exam  HENT:  Nose: No mucosal edema.  Mouth/Throat: No oropharyngeal exudate or posterior oropharyngeal edema.  Eyes: Pupils are equal, round, and reactive to light. Conjunctivae, EOM and lids are normal.  Neck: No JVD present. Carotid bruit is not present. No edema present. No thyroid mass and no thyromegaly present.  Cardiovascular: S1 normal and S2 normal.  Exam reveals no gallop.   No murmur heard. Pulses:      Dorsalis pedis pulses are 2+ on the right side, and 2+ on the left side.  Respiratory: No respiratory distress. She has no wheezes. She has no rhonchi. She has no rales.  GI: Soft. Bowel sounds are normal. There is no tenderness.  Musculoskeletal:       Right ankle: She exhibits no swelling.       Left ankle: She exhibits no swelling.  Lymphadenopathy:    She has no cervical adenopathy.  Neurological: She is alert.  No cranial nerve deficit.  Skin: Skin is warm. No rash noted. Nails show no clubbing.  Psychiatric: She has a normal mood and affect.      Data Reviewed: Basic Metabolic Panel:  Recent Labs Lab 09/08/16 1227 09/13/16 1453 09/14/16 0327  NA 129* 130* 131*  K 5.0 5.7* 5.7*  CL 98* 99* 99*  CO2 18* 17* 18*  GLUCOSE 122* 170* 113*  BUN 61* 93* 91*  CREATININE 8.48* 12.44* 12.13*  CALCIUM 7.6* 7.2* 8.5*  MG 1.9  --   --    CBC:  Recent Labs Lab 09/08/16 1227 09/13/16 1453 09/14/16 0327  WBC 6.1 6.8 7.6  NEUTROABS  --  5.3  --   HGB 9.9* 8.6* 8.1*  HCT 29.1* 26.1* 24.2*  MCV 95.0 96.0 94.0  PLT 153 168 162    CBG:  Recent Labs Lab 09/09/16 1138 09/13/16 1536 09/13/16 2118 09/14/16 0725 09/14/16 1143  GLUCAP 104* 161* 166* 109* 161*    Recent Results (from the past 240 hour(s))  Urine culture     Status: Abnormal   Collection Time: 09/08/16 12:27 PM  Result Value Ref Range Status   Specimen Description URINE, RANDOM  Final   Special Requests NONE  Final   Culture MULTIPLE SPECIES PRESENT, SUGGEST RECOLLECTION (A)  Final   Report Status 09/10/2016 FINAL  Final  MRSA PCR Screening     Status: None   Collection Time: 09/08/16  6:05 PM  Result Value Ref Range Status   MRSA by PCR NEGATIVE NEGATIVE Final    Comment:        The GeneXpert MRSA Assay (FDA approved for NASAL specimens only), is one component of a comprehensive MRSA colonization surveillance program. It is not intended to diagnose MRSA infection nor to guide or monitor treatment for MRSA infections.       Scheduled Meds: . amLODipine  5 mg Oral Daily  . aspirin EC  81 mg Oral Daily  . calcium acetate  2,001 mg Oral TID WC  . calcium-vitamin D  1 tablet Oral Daily  . docusate sodium  100 mg Oral BID  . ferrous sulfate  325 mg Oral BID  . gabapentin  100 mg Oral TID  . gentamicin cream  1 application Topical Daily  . heparin  5,000 Units Subcutaneous Q8H  . hydrALAZINE  25 mg Oral  TID  . insulin aspart  0-9 Units Subcutaneous TID WC  . isosorbide mononitrate  30 mg Oral Daily  . lisinopril  10 mg Oral Daily  . magnesium oxide  400 mg Oral Daily  . patiromer  8.4 g Oral Daily    Assessment/Plan:  1. Hyperkalemia did not improve with peritoneal dialysis last night. Will again have peritoneal dialysis tonight. Veltassa given.  May have to stop lisinopril depending on repeat potassium.  I will give IV calcium. 2. Clotted vascular graft. Case discussed with Dr. Rolly Salter nephrology and he will have vascular surgery see the patient while here. 3. Essential hypertension on amlodipine, imdur and lisinopril 4. Diabetic neuropathy on gabapentin 5. Type 2 diabetes mellitus on sliding scale 6. Chronic diastolic congestive heart failure. This will be harder to manage on peritoneal dialysis. 7. Junctional rhythm on chair if this is potassium related or not. Patient not on any medications that can cause bradycardia  Code Status:     Code Status Orders        Start     Ordered   09/13/16 1628  Full code  Continuous     09/13/16 1629    Code Status History    Date Active Date Inactive Code Status Order ID Comments User Context   09/08/2016  4:31 PM 09/09/2016  6:23 PM Full Code [id number]  Fritzi Mandes, MD Inpatient   07/17/2015 12:44 PM 07/24/2015  7:11 PM Full Code [id number]  Epifanio Lesches, MD ED   03/15/2015  7:56 PM 03/20/2015  3:48 PM Full Code [id number]  Idelle Crouch, MD Inpatient     Disposition Plan: potassium must be better prior to disposition  Consultants:  Nephrology  Vascular surgery  Time spent: 28 minutes  [place], [facility]

## 2016-09-14 NOTE — Progress Notes (Signed)
Post CCPD

## 2016-09-14 NOTE — Consult Note (Signed)
[facility] Consultation Note  Patient ID: Jacqueline Rodriguez, MRN: [id number], DOB/AGE: 03-12-1960 56 y.o. Admit date: 09/13/2016   Date of Consult: 09/14/2016 Primary Physician: Llc, [place] Primary Cardiologist: None  Chief Complaint:  Chief Complaint  Patient presents with  . Vascular Access Problem   Reason for Consult: bradycardia junctional rhythm  HPI: 56 y.o. female with the known apparent significant chronic kidney disease stage V on dialysis who is had significant abnormality of her shunt and AV fistula for which she is needed to be on peritoneal dialysis. The patient has had apparent chronic diastolic dysfunction heart failure diabetes with complication essential hypertension in the past. When arrival with these new symptoms of fatigue weakness and unable to get appropriate to the access the patient had significant electrolyte abnormalities with a potassium of 5.7 a hemoglobin of 8.1 and a GFR of 5. The patient does feel sluggish and weak but no other significant symptoms at this time. Telemetry has shown that the patient does have a junctional rhythm at 40-47 bpm consistent with possible hyperkalemia. Currently she is on appropriate medications for hypertension and this is stable. She will need continued peritoneal dialysis  Past Medical History:  Diagnosis Date  . Anemia   . Chronic bronchitis ([place])   . Chronic diastolic CHF (congestive heart failure) ([place])   . CKD (chronic kidney disease), stage III   . COPD (chronic obstructive pulmonary disease) ([place])   . Diabetes mellitus with renal complications ([place])   . Essential hypertension   . GERD (gastroesophageal reflux disease)   . Obesity   . Peripheral vascular disease ([place])   . Pulmonary hypertension ([place])   . Renal insufficiency 09/19/2015   Stage 3 CKD. Beginning dialysis.      Surgical History:  Past Surgical History:  Procedure Laterality Date  . AV FISTULA PLACEMENT Left 10/01/2015    Procedure: ARTERIOVENOUS (AV) FISTULA CREATION ( BRACHIAL CEPHALIC );  Surgeon: Algernon Huxley, MD;  Location: [facility] ORS;  Service: Vascular;  Laterality: Left;  . CAPD INSERTION N/A 06/10/2016   Procedure: LAPAROSCOPIC INSERTION CONTINUOUS AMBULATORY PERITONEAL DIALYSIS  (CAPD) CATHETER;  Surgeon: Algernon Huxley, MD;  Location: [facility] ORS;  Service: Vascular;  Laterality: N/A;  . PERIPHERAL VASCULAR CATHETERIZATION N/A 07/21/2015   Procedure: Dialysis/Perma Catheter;  Surgeon: Algernon Huxley, MD;  Location: [place] [facility];  Service: Cardiovascular;  Laterality: N/A;  . PERIPHERAL VASCULAR CATHETERIZATION N/A 12/11/2015   Procedure: Dialysis/Perma Catheter Removal;  Surgeon: Algernon Huxley, MD;  Location: [place] [facility];  Service: Cardiovascular;  Laterality: N/A;     Home Meds: Prior to Admission medications   Medication Sig Start Date End Date Taking? Authorizing Provider  acetaminophen (TYLENOL) 500 MG tablet Take 500 mg by mouth every 6 (six) hours as needed.   Yes [provider]  albuterol (PROVENTIL HFA;VENTOLIN HFA) 108 (90 Base) MCG/ACT inhaler Inhale 2 puffs into the lungs every 6 (six) hours as needed for wheezing or shortness of breath. 01/08/15  Yes Gregor Hams, MD  amLODipine (NORVASC) 5 MG tablet Take 1 tablet (5 mg total) by mouth daily. 07/24/15  Yes Vaughan Basta, MD  aspirin EC 81 MG EC tablet Take 1 tablet (81 mg total) by mouth daily. Patient taking differently: Take 162 mg by mouth at bedtime.  03/20/15  Yes Vaughan Basta, MD  Calcium-Magnesium-Vitamin D (CALCIUM 1200+D3 PO) Take 1 tablet by mouth daily.   Yes [provider]  cetirizine (ZYRTEC) 10 MG tablet  Take 10 mg by mouth daily as needed for allergies.   Yes [provider]  ferrous sulfate 325 (65 FE) MG tablet Take 325 mg by mouth 2 (two) times daily.   Yes [provider]  gabapentin (NEURONTIN) 100 MG capsule Take 100 mg by mouth 3 (three) times daily as needed.  For neuropathy. 03/05/15  Yes [provider]  gentamicin cream (GARAMYCIN) 0.1 % Apply 1 application topically daily. 06/25/16  Yes [provider]  GLUCOSAMINE-CHONDROITIN PO Take 4,000 mg by mouth daily.   Yes [provider]  hydrALAZINE (APRESOLINE) 25 MG tablet Take 25 mg by mouth 3 (three) times daily.   Yes [provider]  HYDROcodone-acetaminophen (NORCO) 5-325 MG tablet Take 1 tablet by mouth every 6 (six) hours as needed for moderate pain. 06/10/16  Yes Algernon Huxley, MD  isosorbide mononitrate (IMDUR) 30 MG 24 hr tablet Take 30 mg by mouth daily.   Yes [provider]  lisinopril (PRINIVIL,ZESTRIL) 10 MG tablet Take 10 mg by mouth daily.    Yes [provider]  meclizine (ANTIVERT) 25 MG tablet Take 1 tablet (25 mg total) by mouth 3 (three) times daily as needed for dizziness. 12/08/15  Yes Nance Pear, MD  HYDROcodone-acetaminophen (NORCO) 5-325 MG tablet Take 1 tablet by mouth every 6 (six) hours as needed for moderate pain. Patient not taking: Reported on 09/13/2016 10/01/15   Algernon Huxley, MD  Magnesium Oxide 250 MG TABS Take 1 tablet by mouth daily.    [provider]    Inpatient Medications:  . amLODipine  5 mg Oral Daily  . aspirin EC  81 mg Oral Daily  . calcium acetate  2,001 mg Oral TID WC  . calcium-vitamin D  1 tablet Oral Daily  . docusate sodium  100 mg Oral BID  . ferrous sulfate  325 mg Oral BID  . gabapentin  100 mg Oral TID  . gentamicin cream  1 application Topical Daily  . heparin  5,000 Units Subcutaneous Q8H  . hydrALAZINE  25 mg Oral TID  . insulin aspart  0-9 Units Subcutaneous TID WC  . isosorbide mononitrate  30 mg Oral Daily  . lisinopril  10 mg Oral Daily  . magnesium oxide  400 mg Oral Daily  . patiromer  8.4 g Oral Daily   . tobramycin      Allergies:  Allergies  Allergen Reactions  . Ambien [Zolpidem] Other (See Comments)    hallucinations    Social History   Social  History  . Marital status: Married    Spouse name: N/A  . Number of children: N/A  . Years of education: N/A   Occupational History  . Not on file.   Social History Main Topics  . Smoking status: Former Smoker    Types: Cigarettes    Quit date: 03/24/1985  . Smokeless tobacco: Never Used  . Alcohol use No  . Drug use: No  . Sexual activity: Not on file   Other Topics Concern  . Not on file   Social History Narrative  . No narrative on file     Family History  Problem Relation Age of Onset  . Hypertension Mother   . Diabetes Mellitus II Mother   . CAD Father   . Hypertension Father   . Heart attack Father      Review of Systems Positive for Weakness fatigue Negative for: General:  chills, fever, night sweats or weight changes.  Cardiovascular: PND  orthopnea syncope dizziness  Dermatological skin lesions rashes Respiratory: Cough congestion Urologic: Frequent urination urination at night and hematuria Abdominal: negative for nausea, vomiting, diarrhea, bright red blood per rectum, melena, or hematemesis Neurologic: negative for visual changes, and/or hearing changes  All other systems reviewed and are otherwise negative except as noted above.  Labs: No results for input(s): CKTOTAL, CKMB, TROPONINI in the last 72 hours. Lab Results  Component Value Date   WBC 7.6 09/14/2016   HGB 8.1 (L) 09/14/2016   HCT 24.2 (L) 09/14/2016   MCV 94.0 09/14/2016   PLT 162 09/14/2016    Recent Labs Lab 09/14/16 0327  NA 131*  K 5.7*  CL 99*  CO2 18*  BUN 91*  CREATININE 12.13*  CALCIUM 8.5*  GLUCOSE 113*   Lab Results  Component Value Date   CHOL 152 09/08/2016   HDL 46 09/08/2016   LDLCALC 89 09/08/2016   TRIG 85 09/08/2016   No results found for: DDIMER  Radiology/Studies:  Ct Angio Head W Or Wo Contrast  Result Date: 09/08/2016 CLINICAL DATA:  Persistent vertigo, central. EXAM: CT ANGIOGRAPHY HEAD AND NECK TECHNIQUE: Multidetector CT imaging of the head  and neck was performed using the standard protocol during bolus administration of intravenous contrast. Multiplanar CT image reconstructions and MIPs were obtained to evaluate the vascular anatomy. Carotid stenosis measurements (when applicable) are obtained utilizing NASCET criteria, using the distal internal carotid diameter as the denominator. CONTRAST:  75 cc Isovue 370 intravenous COMPARISON:  None. FINDINGS: CT head: Brain: No evidence of infarct, hemorrhage, hydrocephalus, or masslike finding. Vascular: See below. Skull: Negative. Sinuses and orbits: Negative. CTA NECK FINDINGS Aortic arch: Atherosclerotic plaque on the aorta. Three vessel branching. No acute finding. Right carotid system: Mild partially calcified plaque at the common carotid bifurcation. No stenosis, ulceration, or dissection. Left carotid system: Prominent plaque at the CCA origin with ~50% luminal stenosis. Moderate peripherally calcified centrally low-density plaque at the common carotid bifurcation with 25% proximal ICA narrowing. No definite ulceration. There is mild motion artifact at the level of the left CCA bifurcation. Mild proximal ECA narrowing. Vertebral arteries: No proximal subclavian stenosis. Both vertebral arteries are smooth and widely patent to the dura. Skeleton: No acute or aggressive finding. Other neck: No incidental mass or inflammation noted. Upper chest: Chronic scar-like opacity at the left apex with calcification, also seen on chest CT 01/08/2015. Hazy appearance of the upper lungs which could be from atelectasis. Fissural fluid in the upper left major fissure. Chest x-ray performed today. Review of the MIP images confirms the above findings CTA HEAD FINDINGS Anterior circulation: Atherosclerotic plaque on the carotid siphons without stenosis or ulceration. No branch occlusion, beading, or aneurysm. Posterior circulation: Essentially codominant vertebral arteries. Vertebral and basilar arteries are smooth and  widely patent Venous sinuses: Patent Anatomic variants: None significant Delayed phase: No abnormal intracranial enhancement. Review of the MIP images confirms the above findings IMPRESSION: 1. No acute finding.  No evidence of vertebrobasilar insufficiency. 2. Atherosclerosis, most notable in the left carotid circulation where there is 50% common carotid origins stenosis and 25% proximal ICA stenosis. 3. Electronically Signed   By: Monte Fantasia M.D.   On: 09/08/2016 14:27   Dg Chest 2 View  Result Date: 09/08/2016 CLINICAL DATA:  Hypoglycemia earlier today. History of dialysis dependent renal failure, CHF, COPD, former smoker. EXAM: CHEST  2 VIEW COMPARISON:  Chest x-ray of July 17, 2015 FINDINGS: The lungs are well-expanded. The interstitial markings are mildly increased. The  pulmonary vascularity is mildly engorged. The cardiac silhouette is enlarged. The left hemidiaphragm is obscured. There is calcification in the wall of the aortic arch. There is some fluid in the major fissure presumably on the left. No significant costophrenic angle angle blunting is observed. IMPRESSION: Mild CHF.  No alveolar pneumonia nor large pleural effusion. Thoracic aortic atherosclerosis. Electronically Signed   By: David  Martinique M.D.   On: 09/08/2016 13:40   Ct Angio Neck W And/or Wo Contrast  Result Date: 09/08/2016 CLINICAL DATA:  Persistent vertigo, central. EXAM: CT ANGIOGRAPHY HEAD AND NECK TECHNIQUE: Multidetector CT imaging of the head and neck was performed using the standard protocol during bolus administration of intravenous contrast. Multiplanar CT image reconstructions and MIPs were obtained to evaluate the vascular anatomy. Carotid stenosis measurements (when applicable) are obtained utilizing NASCET criteria, using the distal internal carotid diameter as the denominator. CONTRAST:  75 cc Isovue 370 intravenous COMPARISON:  None. FINDINGS: CT head: Brain: No evidence of infarct, hemorrhage, hydrocephalus, or  masslike finding. Vascular: See below. Skull: Negative. Sinuses and orbits: Negative. CTA NECK FINDINGS Aortic arch: Atherosclerotic plaque on the aorta. Three vessel branching. No acute finding. Right carotid system: Mild partially calcified plaque at the common carotid bifurcation. No stenosis, ulceration, or dissection. Left carotid system: Prominent plaque at the CCA origin with ~50% luminal stenosis. Moderate peripherally calcified centrally low-density plaque at the common carotid bifurcation with 25% proximal ICA narrowing. No definite ulceration. There is mild motion artifact at the level of the left CCA bifurcation. Mild proximal ECA narrowing. Vertebral arteries: No proximal subclavian stenosis. Both vertebral arteries are smooth and widely patent to the dura. Skeleton: No acute or aggressive finding. Other neck: No incidental mass or inflammation noted. Upper chest: Chronic scar-like opacity at the left apex with calcification, also seen on chest CT 01/08/2015. Hazy appearance of the upper lungs which could be from atelectasis. Fissural fluid in the upper left major fissure. Chest x-ray performed today. Review of the MIP images confirms the above findings CTA HEAD FINDINGS Anterior circulation: Atherosclerotic plaque on the carotid siphons without stenosis or ulceration. No branch occlusion, beading, or aneurysm. Posterior circulation: Essentially codominant vertebral arteries. Vertebral and basilar arteries are smooth and widely patent Venous sinuses: Patent Anatomic variants: None significant Delayed phase: No abnormal intracranial enhancement. Review of the MIP images confirms the above findings IMPRESSION: 1. No acute finding.  No evidence of vertebrobasilar insufficiency. 2. Atherosclerosis, most notable in the left carotid circulation where there is 50% common carotid origins stenosis and 25% proximal ICA stenosis. 3. Electronically Signed   By: Monte Fantasia M.D.   On: 09/08/2016 14:27   Mr  Brain Wo Contrast  Result Date: 09/08/2016 CLINICAL DATA:  Numbness or tingling. Paresthesias. Central vertigo. Hypoglycemia. EXAM: MRI HEAD WITHOUT CONTRAST TECHNIQUE: Multiplanar, multiecho pulse sequences of the brain and surrounding structures were obtained without intravenous contrast. COMPARISON:  CTA head and neck from the same day. FINDINGS: Brain: No acute infarct, hemorrhage, or mass lesion is present. The ventricles are of normal size. Study is mildly degraded by patient motion. Periventricular T2 changes are mildly advanced for age. White matter changes extend into the brainstem. The cerebellum is unremarkable. The internal auditory canals are within normal limits bilaterally. Vascular: Flow is present in the major intracranial arteries. Skull and upper cervical spine: The skullbase is within normal limits. The craniocervical junction is unremarkable. Midline sagittal structures are within normal limits. Sinuses/Orbits: The paranasal sinuses and mastoid air cells are clear. The globes  and orbits are within normal limits. IMPRESSION: 1. Periventricular white matter changes are mildly advanced for age. White matter changes extend into the brainstem. This likely reflects the sequela of chronic microvascular ischemia. The finding is nonspecific. 2. No other acute or focal lesion to explain the patient's numbness or central vertigo. Electronically Signed   By: San Morelle M.D.   On: 09/08/2016 16:25    EKG: Junctional rhythm at 47 bpm  Weights: Filed Weights   09/13/16 1840 09/14/16 0500  Weight: 103.9 kg (229 lb) 106.4 kg (234 lb 8 oz)     Physical Exam: Blood pressure (!) 174/57, pulse 60, temperature 98.1 F (36.7 C), temperature source Oral, resp. rate 19, height 5\' 9"  (1.753 m), weight 106.4 kg (234 lb 8 oz), last menstrual period 09/18/2013, SpO2 99 %. Body mass index is 34.63 kg/m. General: Well developed, well nourished, in no acute distress. Head eyes ears nose throat:  Normocephalic, atraumatic, sclera non-icteric, no xanthomas, nares are without discharge. No apparent thyromegaly and/or mass  Lungs: Normal respiratory effort.  no wheezes, Few basilar rales, no rhonchi.  Heart: Slow rhythm with normal S1 S2. no murmur gallop, no rub, PMI is normal size and placement, carotid upstroke normal without bruit, jugular venous pressure is normal Abdomen: Soft, non-tender, non-distended with normoactive bowel sounds. No hepatomegaly. No rebound/guarding. No obvious abdominal masses. Abdominal aorta is normal size without bruit Extremities: 1+ edema. no cyanosis, no clubbing, no ulcers  Peripheral : 2+ bilateral upper extremity pulses, 2+ bilateral femoral pulses, 2+ bilateral dorsal pedal pulse Neuro: Alert and oriented. No facial asymmetry. No focal deficit. Moves all extremities spontaneously. Musculoskeletal: Normal muscle tone without kyphosis Psych:  Responds to questions appropriately with a normal affect.    Assessment: 56 year old female with anemia essential hypertension mixed hyperlipidemia diabetes with complication chronic kidney disease stage V with hyperkalemia likely causing junctional rhythm and bradycardia without current symptoms of heart failure or anginal symptoms or syncope  Plan: 1. Continue supportive care of chronic kidney disease stage V and start peritoneal dialysis to improve electrolytes and hyperkalemia 2. Watch closely for any significant symptoms or other rhythm disturbances and follow for improvements of junctional rhythm with improvements of electrolytes as above 3. Continue hypertension medication management with ACE inhibitor amlodipine 4. Avoid any beta-blocking agents due to concerns of bradycardia  5. No further cardiac diagnostics necessary at this time  Signed, Corey Skains M.D. [facility] 09/14/2016, 5:06 PM

## 2016-09-14 NOTE — Consult Note (Signed)
[facility] Vascular Consult Note  MRN : [id number]  Jacqueline Rodriguez is a 56 y.o. ([birth year]) female who presents with chief complaint of  Chief Complaint  Patient presents with  . Vascular Access Problem  .  History of Present Illness: I am asked to evaluate the patient by Dr. Juleen China.  She is a 56 year old woman known to our service who has end-stage renal disease and is currently on hemodialysis Monday Wednesday and Fridays. She presented to the hospital yesterday secondary to difficulties with dialysis. She reports that after proximally one hour the venous needle began "pulling clots".  She presented with a potassium 5.7.  The patient has also had a peritoneal catheter placed and this was utilized yesterday for her dialysis and has functioned quite well.  Current Facility-Administered Medications  Medication Dose Route Frequency Provider Last Rate Last Dose  . acetaminophen (TYLENOL) tablet 650 mg  650 mg Oral Q6H PRN Epifanio Lesches, MD       Or  . acetaminophen (TYLENOL) suppository 650 mg  650 mg Rectal Q6H PRN Epifanio Lesches, MD      . albuterol (PROVENTIL) (2.5 MG/3ML) 0.083% nebulizer solution 2.5 mg  2.5 mg Inhalation Q6H PRN Epifanio Lesches, MD      . amLODipine (NORVASC) tablet 5 mg  5 mg Oral Daily Epifanio Lesches, MD   5 mg at 09/14/16 8182  . aspirin EC tablet 81 mg  81 mg Oral Daily Epifanio Lesches, MD   81 mg at 09/14/16 9937  . bisacodyl (DULCOLAX) EC tablet 5 mg  5 mg Oral Daily PRN Epifanio Lesches, MD      . calcium acetate (PHOSLO) capsule 2,001 mg  2,001 mg Oral TID WC Kolluru, Sarath, MD   2,001 mg at 09/14/16 1758  . calcium-vitamin D (OSCAL WITH D) 500-200 MG-UNIT per tablet 1 tablet  1 tablet Oral Daily Epifanio Lesches, MD   1 tablet at 09/14/16 0831  . docusate sodium (COLACE) capsule 100 mg  100 mg Oral BID Epifanio Lesches, MD   100 mg at 09/14/16 1696  . ferrous sulfate tablet 325 mg  325 mg Oral  BID Epifanio Lesches, MD   325 mg at 09/14/16 7893  . gabapentin (NEURONTIN) capsule 100 mg  100 mg Oral TID Epifanio Lesches, MD   100 mg at 09/14/16 1516  . gentamicin cream (GARAMYCIN) 0.1 % 1 application  1 application Topical Daily Lateef, Munsoor, MD   1 application at 81/01/75 0834  . heparin 1000 unit/ml injection 500 Units  500 Units Intraperitoneal PRN Lateef, Munsoor, MD      . heparin injection 5,000 Units  5,000 Units Subcutaneous Q8H Epifanio Lesches, MD   5,000 Units at 09/14/16 1516  . hydrALAZINE (APRESOLINE) tablet 25 mg  25 mg Oral TID Epifanio Lesches, MD   25 mg at 09/14/16 1516  . HYDROcodone-acetaminophen (NORCO/VICODIN) 5-325 MG per tablet 1-2 tablet  1-2 tablet Oral Q4H PRN Epifanio Lesches, MD      . insulin aspart (novoLOG) injection 0-9 Units  0-9 Units Subcutaneous TID WC Epifanio Lesches, MD   2 Units at 09/14/16 1232  . isosorbide mononitrate (IMDUR) 24 hr tablet 30 mg  30 mg Oral Daily Epifanio Lesches, MD   30 mg at 09/14/16 1046  . lisinopril (PRINIVIL,ZESTRIL) tablet 10 mg  10 mg Oral Daily Epifanio Lesches, MD   10 mg at 09/14/16 1025  . loratadine (CLARITIN) tablet 10 mg  10 mg Oral Daily PRN Epifanio Lesches, MD      .  magnesium oxide (MAG-OX) tablet 400 mg  400 mg Oral Daily Epifanio Lesches, MD   400 mg at 09/14/16 0539  . meclizine (ANTIVERT) tablet 25 mg  25 mg Oral TID PRN Epifanio Lesches, MD      . ondansetron (ZOFRAN) tablet 4 mg  4 mg Oral Q6H PRN Epifanio Lesches, MD       Or  . ondansetron (ZOFRAN) injection 4 mg  4 mg Intravenous Q6H PRN Epifanio Lesches, MD      . patiromer Daryll Drown) packet 8.4 g  8.4 g Oral Daily Lateef, Munsoor, MD   8.4 g at 09/14/16 1027  . tobramycin (NEBCIN) 210 mg in dextrose 5 % 50 mL IVPB  2.5 mg/kg (Adjusted) Intravenous Q dialysis Coffee, Donna Christen, RPH      . traZODone (DESYREL) tablet 25 mg  25 mg Oral QHS PRN Epifanio Lesches, MD        Past Medical History:   Diagnosis Date  . Anemia   . Chronic bronchitis ([place])   . Chronic diastolic CHF (congestive heart failure) ([place])   . CKD (chronic kidney disease), stage III   . COPD (chronic obstructive pulmonary disease) ([place])   . Diabetes mellitus with renal complications ([place])   . Essential hypertension   . GERD (gastroesophageal reflux disease)   . Obesity   . Peripheral vascular disease ([place])   . Pulmonary hypertension ([place])   . Renal insufficiency 09/19/2015   Stage 3 CKD. Beginning dialysis.    Past Surgical History:  Procedure Laterality Date  . AV FISTULA PLACEMENT Left 10/01/2015   Procedure: ARTERIOVENOUS (AV) FISTULA CREATION ( BRACHIAL CEPHALIC );  Surgeon: Algernon Huxley, MD;  Location: [facility]S;  Service: Vascular;  Laterality: Left;  . CAPD INSERTION N/A 06/10/2016   Procedure: LAPAROSCOPIC INSERTION CONTINUOUS AMBULATORY PERITONEAL DIALYSIS  (CAPD) CATHETER;  Surgeon: Algernon Huxley, MD;  Location: [facility] ORS;  Service: Vascular;  Laterality: N/A;  . PERIPHERAL VASCULAR CATHETERIZATION N/A 07/21/2015   Procedure: Dialysis/Perma Catheter;  Surgeon: Algernon Huxley, MD;  Location: [facility];  Service: Cardiovascular;  Laterality: N/A;  . PERIPHERAL VASCULAR CATHETERIZATION N/A 12/11/2015   Procedure: Dialysis/Perma Catheter Removal;  Surgeon: Algernon Huxley, MD;  Location: [facility];  Service: Cardiovascular;  Laterality: N/A;    Social History Social History  Substance Use Topics  . Smoking status: Former Smoker    Types: Cigarettes    Quit date: 03/24/1985  . Smokeless tobacco: Never Used  . Alcohol use No    Family History Family History  Problem Relation Age of Onset  . Hypertension Mother   . Diabetes Mellitus II Mother   . CAD Father   . Hypertension Father   . Heart attack Father   No family history of bleeding/clotting disorders, porphyria or autoimmune disease   Allergies  Allergen Reactions  . Ambien [Zolpidem] Other (See Comments)    hallucinations      REVIEW OF SYSTEMS (Negative unless checked)  Constitutional: [] Weight loss  [] Fever  [] Chills Cardiac: [] Chest pain   [] Chest pressure   [] Palpitations   [] Shortness of breath when laying flat   [] Shortness of breath at rest   [] Shortness of breath with exertion. Vascular:  [] Pain in legs with walking   [] Pain in legs at rest   [] Pain in legs when laying flat   [] Claudication   [] Pain in feet when walking  [] Pain in feet at rest  [] Pain in feet when laying flat   [] History of DVT   []   Phlebitis   [] Swelling in legs   [] Varicose veins   [] Non-healing ulcers Pulmonary:   [] Uses home oxygen   [] Productive cough   [] Hemoptysis   [] Wheeze  [] COPD   [] Asthma Neurologic:  [] Dizziness  [] Blackouts   [] Seizures   [] History of stroke   [] History of TIA  [] Aphasia   [] Temporary blindness   [] Dysphagia   [] Weakness or numbness in arms   [] Weakness or numbness in legs Musculoskeletal:  [] Arthritis   [] Joint swelling   [] Joint pain   [] Low back pain Hematologic:  [] Easy bruising  [] Easy bleeding   [] Hypercoagulable state   [] Anemic  [] Hepatitis Gastrointestinal:  [] Blood in stool   [] Vomiting blood  [] Gastroesophageal reflux/heartburn   [] Difficulty swallowing. Genitourinary:  [x] Chronic kidney disease   [] Difficult urination  [] Frequent urination  [] Burning with urination   [] Blood in urine Skin:  [] Rashes   [] Ulcers   [] Wounds Psychological:  [] History of anxiety   []  History of major depression.  Physical Examination  Vitals:   09/14/16 0408 09/14/16 0500 09/14/16 1300 09/14/16 1732  BP: (!) 179/56  (!) 174/57 (!) 164/52  Pulse: (!) 59  60 (!) 51  Resp: 18  19   Temp: 98.2 F (36.8 C)  98.1 F (36.7 C)   TempSrc: Oral  Oral   SpO2: 100%  99%   Weight:  106.4 kg (234 lb 8 oz)    Height:       Body mass index is 34.63 kg/m. Gen:  WD/WN, NAD Head: [place]/AT, No temporalis wasting. Prominent temp pulse not noted. Ear/Nose/Throat: Hearing grossly intact, nares w/o erythema or drainage, oropharynx  w/o Erythema/Exudate Eyes: Sclera non-icteric, conjunctiva clear Neck: Trachea midline.  No JVD.  Pulmonary:  Good air movement, respirations not labored, equal bilaterally.  Cardiac: RRR, normal S1, S2. Vascular: eft arm brachial cephalic fistula with an excellent thrill and a good bruit. There is ecchymoses and hardness surrounding the fistula at the venous access site consistent with hematoma .  pD catheter in place and intact Vessel Right Left  Radial Palpable Palpable  Ulnar Palpable Palpable  Brachial Palpable Palpable  Gastrointestinal: soft, non-tender/non-distended. No guarding/reflex.  Musculoskeletal: M/S 5/5 throughout.  Extremities without ischemic changes.  No deformity or atrophy. No edema. Neurologic: Sensation grossly intact in extremities.  Symmetrical.  Speech is fluent. Motor exam as listed above. Psychiatric: Judgment intact, Mood & affect appropriate for pt's clinical situation. Dermatologic: No rashes or ulcers noted.  No cellulitis or open wounds. Lymph : No Cervical, Axillary, or Inguinal lymphadenopathy.      CBC Lab Results  Component Value Date   WBC 7.6 09/14/2016   HGB 8.1 (L) 09/14/2016   HCT 24.2 (L) 09/14/2016   MCV 94.0 09/14/2016   PLT 162 09/14/2016    BMET    Component Value Date/Time   NA 131 (L) 09/14/2016 0327   NA 137 12/29/2013 2029   K 5.7 (H) 09/14/2016 0327   K 4.3 12/29/2013 2029   CL 99 (L) 09/14/2016 0327   CL 104 12/29/2013 2029   CO2 18 (L) 09/14/2016 0327   CO2 25 12/29/2013 2029   GLUCOSE 113 (H) 09/14/2016 0327   GLUCOSE 357 (H) 12/29/2013 2029   BUN 91 (H) 09/14/2016 0327   BUN 25 (H) 12/29/2013 2029   CREATININE 12.13 (H) 09/14/2016 0327   CREATININE 1.55 (H) 12/29/2013 2029   CALCIUM 8.5 (L) 09/14/2016 0327   CALCIUM 8.1 (L) 12/29/2013 2029   GFRNONAA 3 (L) 09/14/2016 0327   GFRNONAA 37 (  L) 12/29/2013 2029   GFRNONAA 42 (L) 07/13/2013 1941   GFRAA 3 (L) 09/14/2016 0327   GFRAA 45 (L) 12/29/2013 2029   GFRAA  49 (L) 07/13/2013 1941   Estimated Creatinine Clearance: 6.6 mL/min (A) (by C-G formula based on SCr of 12.13 mg/dL (H)).  COAG Lab Results  Component Value Date   INR 1.11 06/03/2016   INR 1.05 08/27/2015   INR 1.12 07/21/2015    Radiology Ct Angio Head W Or Wo Contrast  Result Date: 09/08/2016 CLINICAL DATA:  Persistent vertigo, central. EXAM: CT ANGIOGRAPHY HEAD AND NECK TECHNIQUE: Multidetector CT imaging of the head and neck was performed using the standard protocol during bolus administration of intravenous contrast. Multiplanar CT image reconstructions and MIPs were obtained to evaluate the vascular anatomy. Carotid stenosis measurements (when applicable) are obtained utilizing NASCET criteria, using the distal internal carotid diameter as the denominator. CONTRAST:  75 cc Isovue 370 intravenous COMPARISON:  None. FINDINGS: CT head: Brain: No evidence of infarct, hemorrhage, hydrocephalus, or masslike finding. Vascular: See below. Skull: Negative. Sinuses and orbits: Negative. CTA NECK FINDINGS Aortic arch: Atherosclerotic plaque on the aorta. Three vessel branching. No acute finding. Right carotid system: Mild partially calcified plaque at the common carotid bifurcation. No stenosis, ulceration, or dissection. Left carotid system: Prominent plaque at the CCA origin with ~50% luminal stenosis. Moderate peripherally calcified centrally low-density plaque at the common carotid bifurcation with 25% proximal ICA narrowing. No definite ulceration. There is mild motion artifact at the level of the left CCA bifurcation. Mild proximal ECA narrowing. Vertebral arteries: No proximal subclavian stenosis. Both vertebral arteries are smooth and widely patent to the dura. Skeleton: No acute or aggressive finding. Other neck: No incidental mass or inflammation noted. Upper chest: Chronic scar-like opacity at the left apex with calcification, also seen on chest CT 01/08/2015. Hazy appearance of the upper lungs  which could be from atelectasis. Fissural fluid in the upper left major fissure. Chest x-ray performed today. Review of the MIP images confirms the above findings CTA HEAD FINDINGS Anterior circulation: Atherosclerotic plaque on the carotid siphons without stenosis or ulceration. No branch occlusion, beading, or aneurysm. Posterior circulation: Essentially codominant vertebral arteries. Vertebral and basilar arteries are smooth and widely patent Venous sinuses: Patent Anatomic variants: None significant Delayed phase: No abnormal intracranial enhancement. Review of the MIP images confirms the above findings IMPRESSION: 1. No acute finding.  No evidence of vertebrobasilar insufficiency. 2. Atherosclerosis, most notable in the left carotid circulation where there is 50% common carotid origins stenosis and 25% proximal ICA stenosis. 3. Electronically Signed   By: Monte Fantasia M.D.   On: 09/08/2016 14:27   Dg Chest 2 View  Result Date: 09/08/2016 CLINICAL DATA:  Hypoglycemia earlier today. History of dialysis dependent renal failure, CHF, COPD, former smoker. EXAM: CHEST  2 VIEW COMPARISON:  Chest x-ray of July 17, 2015 FINDINGS: The lungs are well-expanded. The interstitial markings are mildly increased. The pulmonary vascularity is mildly engorged. The cardiac silhouette is enlarged. The left hemidiaphragm is obscured. There is calcification in the wall of the aortic arch. There is some fluid in the major fissure presumably on the left. No significant costophrenic angle angle blunting is observed. IMPRESSION: Mild CHF.  No alveolar pneumonia nor large pleural effusion. Thoracic aortic atherosclerosis. Electronically Signed   By: David  Martinique M.D.   On: 09/08/2016 13:40   Ct Angio Neck W And/or Wo Contrast  Result Date: 09/08/2016 CLINICAL DATA:  Persistent vertigo, central. EXAM: CT ANGIOGRAPHY  HEAD AND NECK TECHNIQUE: Multidetector CT imaging of the head and neck was performed using the standard protocol  during bolus administration of intravenous contrast. Multiplanar CT image reconstructions and MIPs were obtained to evaluate the vascular anatomy. Carotid stenosis measurements (when applicable) are obtained utilizing NASCET criteria, using the distal internal carotid diameter as the denominator. CONTRAST:  75 cc Isovue 370 intravenous COMPARISON:  None. FINDINGS: CT head: Brain: No evidence of infarct, hemorrhage, hydrocephalus, or masslike finding. Vascular: See below. Skull: Negative. Sinuses and orbits: Negative. CTA NECK FINDINGS Aortic arch: Atherosclerotic plaque on the aorta. Three vessel branching. No acute finding. Right carotid system: Mild partially calcified plaque at the common carotid bifurcation. No stenosis, ulceration, or dissection. Left carotid system: Prominent plaque at the CCA origin with ~50% luminal stenosis. Moderate peripherally calcified centrally low-density plaque at the common carotid bifurcation with 25% proximal ICA narrowing. No definite ulceration. There is mild motion artifact at the level of the left CCA bifurcation. Mild proximal ECA narrowing. Vertebral arteries: No proximal subclavian stenosis. Both vertebral arteries are smooth and widely patent to the dura. Skeleton: No acute or aggressive finding. Other neck: No incidental mass or inflammation noted. Upper chest: Chronic scar-like opacity at the left apex with calcification, also seen on chest CT 01/08/2015. Hazy appearance of the upper lungs which could be from atelectasis. Fissural fluid in the upper left major fissure. Chest x-ray performed today. Review of the MIP images confirms the above findings CTA HEAD FINDINGS Anterior circulation: Atherosclerotic plaque on the carotid siphons without stenosis or ulceration. No branch occlusion, beading, or aneurysm. Posterior circulation: Essentially codominant vertebral arteries. Vertebral and basilar arteries are smooth and widely patent Venous sinuses: Patent Anatomic  variants: None significant Delayed phase: No abnormal intracranial enhancement. Review of the MIP images confirms the above findings IMPRESSION: 1. No acute finding.  No evidence of vertebrobasilar insufficiency. 2. Atherosclerosis, most notable in the left carotid circulation where there is 50% common carotid origins stenosis and 25% proximal ICA stenosis. 3. Electronically Signed   By: Monte Fantasia M.D.   On: 09/08/2016 14:27   Mr Brain Wo Contrast  Result Date: 09/08/2016 CLINICAL DATA:  Numbness or tingling. Paresthesias. Central vertigo. Hypoglycemia. EXAM: MRI HEAD WITHOUT CONTRAST TECHNIQUE: Multiplanar, multiecho pulse sequences of the brain and surrounding structures were obtained without intravenous contrast. COMPARISON:  CTA head and neck from the same day. FINDINGS: Brain: No acute infarct, hemorrhage, or mass lesion is present. The ventricles are of normal size. Study is mildly degraded by patient motion. Periventricular T2 changes are mildly advanced for age. White matter changes extend into the brainstem. The cerebellum is unremarkable. The internal auditory canals are within normal limits bilaterally. Vascular: Flow is present in the major intracranial arteries. Skull and upper cervical spine: The skullbase is within normal limits. The craniocervical junction is unremarkable. Midline sagittal structures are within normal limits. Sinuses/Orbits: The paranasal sinuses and mastoid air cells are clear. The globes and orbits are within normal limits. IMPRESSION: 1. Periventricular white matter changes are mildly advanced for age. White matter changes extend into the brainstem. This likely reflects the sequela of chronic microvascular ischemia. The finding is nonspecific. 2. No other acute or focal lesion to explain the patient's numbness or central vertigo. Electronically Signed   By: San Morelle M.D.   On: 09/08/2016 16:25      Assessment/Plan 1.complication of dialysis access:  At  the present time the patient's PD catheter is functioning well and therefore she has adequate access  for dialysis. There is no emergency with respect to treating her AV fistula. Given my findings on physical exam I would recommend that we follow her fistula up as an outpatient. We'll allow time for the hematoma to resorb and obtain a noninvasive duplex ultrasound before moving forward with angiography and possible intervention.  One caveat if she cannot arrange for PD at home immediately and will therefore be required to continue hemodialysis she will need a fistulogram prior to discharge in order to more adequately assess her access for continued use. 2.end stage renal disease: Continue PD dialysis for now. Nephrology following 3. Hypertension: Home medications reviewed no changes at this time. 4.  Gastroesophageal reflux disease: Continue PPI as ordered   Hortencia Pilar, MD  09/14/2016 6:39 PM    This note was created with Dragon medical transcription system.  Any error is purely unintentional

## 2016-09-14 NOTE — Progress Notes (Signed)
Telemetry tech notified this nurse of patient abnormal heart rhythm (wide QRS and V-tach beats).VS stable except being bradycardic.Pt. was alert and oriented. No signs of acute distress. Pt. was asymptomtic. Dr. Leslye Peer and Dr. Nehemiah Massed were made aware. No new orders received. Patient is connected to PD at this time. We will continue to monitor.

## 2016-09-14 NOTE — Consult Note (Signed)
[facility]ious Disease     Reason for Consult:PD cath infection     Referring Physician: Karlton Lemon Date of Admission:  09/13/2016   Active Problems:   Hyperkalemia   Hypocalcemia   HPI: Jacqueline Rodriguez is a 56 y.o. 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 She had to start on PD (the cath had not yet been used due to potential site infection. She has been on doxy, bactrim and azithro pending culture results.   Today sensitivities revealed sensitive to clarithromycin but was previously resistant to multiple oral agents. It was only sensitive previously to tobramycin. Currently she denies fevers chills, pain at PD site or abd pain. The cath seems to be working well and PD exit site is no longer draining.    Past Medical History:  Diagnosis Date  . Anemia   . Chronic bronchitis ([place])   . Chronic diastolic CHF (congestive heart failure) ([place])   . CKD (chronic kidney disease), stage III   . COPD (chronic obstructive pulmonary disease) ([place])   . Diabetes mellitus with renal complications ([place])   . Essential hypertension   . GERD (gastroesophageal reflux disease)   . Obesity   . Peripheral vascular disease ([place])   . Pulmonary hypertension ([place])   . Renal insufficiency 09/19/2015   Stage 3 CKD. Beginning dialysis.   Past Surgical History:  Procedure Laterality Date  . AV FISTULA PLACEMENT Left 10/01/2015   Procedure: ARTERIOVENOUS (AV) FISTULA CREATION ( BRACHIAL CEPHALIC );  Surgeon: Algernon Huxley, MD;  Location: [facility] ORS;  Service: Vascular;  Laterality: Left;  . CAPD INSERTION N/A 06/10/2016   Procedure: LAPAROSCOPIC INSERTION CONTINUOUS AMBULATORY PERITONEAL DIALYSIS  (CAPD) CATHETER;  Surgeon: Algernon Huxley, MD;  Location: [facility] ORS;  Service: Vascular;  Laterality: N/A;  . PERIPHERAL VASCULAR CATHETERIZATION N/A 07/21/2015   Procedure: Dialysis/Perma Catheter;  Surgeon: Algernon Huxley, MD;   Location: [facility];  Service: Cardiovascular;  Laterality: N/A;  . PERIPHERAL VASCULAR CATHETERIZATION N/A 12/11/2015   Procedure: Dialysis/Perma Catheter Removal;  Surgeon: Algernon Huxley, MD;  Location: [facility];  Service: Cardiovascular;  Laterality: N/A;   Social History  Substance Use Topics  . Smoking status: Former Smoker    Types: Cigarettes    Quit date: 03/24/1985  . Smokeless tobacco: Never Used  . Alcohol use No   Family History  Problem Relation Age of Onset  . Hypertension Mother   . Diabetes Mellitus II Mother   . CAD Father   . Hypertension Father   . Heart attack Father     Allergies:  Allergies  Allergen Reactions  . Ambien [Zolpidem] Other (See Comments)    hallucinations    Current antibiotics: Antibiotics Given (last 72 hours)    None      MEDICATIONS: . amLODipine  5 mg Oral Daily  . aspirin EC  81 mg Oral Daily  . calcium acetate  2,001 mg Oral TID WC  . calcium-vitamin D  1 tablet Oral Daily  . docusate sodium  100 mg Oral BID  . ferrous sulfate  325 mg Oral BID  . gabapentin  100 mg Oral TID  . gentamicin cream  1 application Topical Daily  . heparin  5,000 Units Subcutaneous Q8H  . hydrALAZINE  25 mg Oral TID  . insulin aspart  0-9 Units Subcutaneous TID WC  . isosorbide mononitrate  30 mg Oral  Daily  . lisinopril  10 mg Oral Daily  . magnesium oxide  400 mg Oral Daily  . patiromer  8.4 g Oral Daily    Review of Systems - 11 systems reviewed and negative per HPI   OBJECTIVE: Temp:  [97.6 F (36.4 C)-98.2 F (36.8 C)] 98.1 F (36.7 C) (09/04 1300) Pulse Rate:  [48-60] 60 (09/04 1300) Resp:  [9-20] 19 (09/04 1300) BP: (104-186)/(44-61) 174/57 (09/04 1300) SpO2:  [97 %-100 %] 99 % (09/04 1300) Weight:  [103.9 kg (229 lb)-106.4 kg (234 lb 8 oz)] 106.4 kg (234 lb 8 oz) (09/04 0500) GENERAL: The patient is alert, oriented and in no acute distress.  HEENT: PERRLA. Oropharynx moist without lesions. Head is  normocephalic/atraumatic. Pupils equal, round and reactive to light and accommodation. Extraocular movements are intact. Nasal mucosa is normal. External ear canals normal. Tympanic membranes are clear.  NECK: Supple without thyromegaly or lymphadenopathy.  LUNGS: Clear to auscultation and percussion.  CARDIAC: Regular rate and rhythm, normal S1 and S2 without murmurs, rubs or gallops.  VASCULAR: Carotid and radial pulses 2+. Distal pulses are 2+.  ABDOMEN: Soft, with normal bowel sounds. No organomegaly or tenderness was found. Large pannus. Her Peritoneal dialysis catheter is clean dry and intact there is no drainage or redness. No tenderness. EXTREMITIES: Full range of motion with no erythema, heat or effusions. No cyanosis, clubbing or edema noted.  NEUROLOGIC: The patient is alert and oriented. Cranial nerves II-XII intact. Motor and sensory examination within normal limits. Gait normal. Reflexes symmetric and brisk.  DERMATOLOGIC: No skin lesions, scaling or bruising noted.  LYMPH: No cervical or supraclavicular nodes.     LABS: Results for orders placed or performed during the hospital encounter of 09/13/16 (from the past 48 hour(s))  Basic metabolic panel     Status: Abnormal   Collection Time: 09/13/16  2:53 PM  Result Value Ref Range   Sodium 130 (L) 135 - 145 mmol/L   Potassium 5.7 (H) 3.5 - 5.1 mmol/L   Chloride 99 (L) 101 - 111 mmol/L   CO2 17 (L) 22 - 32 mmol/L   Glucose, Bld 170 (H) 65 - 99 mg/dL   BUN 93 (H) 6 - 20 mg/dL   Creatinine, Ser 12.44 (H) 0.44 - 1.00 mg/dL   Calcium 7.2 (L) 8.9 - 10.3 mg/dL   GFR calc non Af Amer 3 (L) >60 mL/min   GFR calc Af Amer 3 (L) >60 mL/min    Comment: (NOTE) The eGFR has been calculated using the CKD EPI equation. This calculation has not been validated in all clinical situations. eGFR's persistently <60 mL/min signify possible Chronic Kidney Disease.    Anion gap 14 5 - 15  CBC with Differential     Status: Abnormal   Collection  Time: 09/13/16  2:53 PM  Result Value Ref Range   WBC 6.8 3.6 - 11.0 K/uL   RBC 2.72 (L) 3.80 - 5.20 MIL/uL   Hemoglobin 8.6 (L) 12.0 - 16.0 g/dL   HCT 26.1 (L) 35.0 - 47.0 %   MCV 96.0 80.0 - 100.0 fL   MCH 31.6 26.0 - 34.0 pg   MCHC 32.9 32.0 - 36.0 g/dL   RDW 17.7 (H) 11.5 - 14.5 %   Platelets 168 150 - 440 K/uL   Neutrophils Relative % 77 %   Neutro Abs 5.3 1.4 - 6.5 K/uL   Lymphocytes Relative 12 %   Lymphs Abs 0.8 (L) 1.0 - 3.6 K/uL   Monocytes Relative  6 %   Monocytes Absolute 0.4 0.2 - 0.9 K/uL   Eosinophils Relative 4 %   Eosinophils Absolute 0.3 0 - 0.7 K/uL   Basophils Relative 1 %   Basophils Absolute 0.0 0 - 0.1 K/uL  Glucose, capillary     Status: Abnormal   Collection Time: 09/13/16  3:36 PM  Result Value Ref Range   Glucose-Capillary 161 (H) 65 - 99 mg/dL  Glucose, capillary     Status: Abnormal   Collection Time: 09/13/16  9:18 PM  Result Value Ref Range   Glucose-Capillary 166 (H) 65 - 99 mg/dL   Comment 1 Notify RN   Basic metabolic panel     Status: Abnormal   Collection Time: 09/14/16  3:27 AM  Result Value Ref Range   Sodium 131 (L) 135 - 145 mmol/L   Potassium 5.7 (H) 3.5 - 5.1 mmol/L   Chloride 99 (L) 101 - 111 mmol/L   CO2 18 (L) 22 - 32 mmol/L   Glucose, Bld 113 (H) 65 - 99 mg/dL   BUN 91 (H) 6 - 20 mg/dL   Creatinine, Ser 12.13 (H) 0.44 - 1.00 mg/dL   Calcium 8.5 (L) 8.9 - 10.3 mg/dL   GFR calc non Af Amer 3 (L) >60 mL/min   GFR calc Af Amer 3 (L) >60 mL/min    Comment: (NOTE) The eGFR has been calculated using the CKD EPI equation. This calculation has not been validated in all clinical situations. eGFR's persistently <60 mL/min signify possible Chronic Kidney Disease.    Anion gap 14 5 - 15  CBC     Status: Abnormal   Collection Time: 09/14/16  3:27 AM  Result Value Ref Range   WBC 7.6 3.6 - 11.0 K/uL   RBC 2.57 (L) 3.80 - 5.20 MIL/uL   Hemoglobin 8.1 (L) 12.0 - 16.0 g/dL   HCT 24.2 (L) 35.0 - 47.0 %   MCV 94.0 80.0 - 100.0 fL    MCH 31.6 26.0 - 34.0 pg   MCHC 33.6 32.0 - 36.0 g/dL   RDW 17.7 (H) 11.5 - 14.5 %   Platelets 162 150 - 440 K/uL  Glucose, capillary     Status: Abnormal   Collection Time: 09/14/16  7:25 AM  Result Value Ref Range   Glucose-Capillary 109 (H) 65 - 99 mg/dL  Glucose, capillary     Status: Abnormal   Collection Time: 09/14/16 11:43 AM  Result Value Ref Range   Glucose-Capillary 161 (H) 65 - 99 mg/dL   No components found for: ESR, C REACTIVE PROTEIN MICRO: Recent Results (from the past 720 hour(s))  Urine culture     Status: Abnormal   Collection Time: 09/08/16 12:27 PM  Result Value Ref Range Status   Specimen Description URINE, RANDOM  Final   Special Requests NONE  Final   Culture MULTIPLE SPECIES PRESENT, SUGGEST RECOLLECTION (A)  Final   Report Status 09/10/2016 FINAL  Final  MRSA PCR Screening     Status: None   Collection Time: 09/08/16  6:05 PM  Result Value Ref Range Status   MRSA by PCR NEGATIVE NEGATIVE Final    Comment:        The GeneXpert MRSA Assay (FDA approved for NASAL specimens only), is one component of a comprehensive MRSA colonization surveillance program. It is not intended to diagnose MRSA infection nor to guide or monitor treatment for MRSA infections.     IMAGING: Ct Angio Head W Or Wo Contrast  Result Date: 09/08/2016  CLINICAL DATA:  Persistent vertigo, central. EXAM: CT ANGIOGRAPHY HEAD AND NECK TECHNIQUE: Multidetector CT imaging of the head and neck was performed using the standard protocol during bolus administration of intravenous contrast. Multiplanar CT image reconstructions and MIPs were obtained to evaluate the vascular anatomy. Carotid stenosis measurements (when applicable) are obtained utilizing NASCET criteria, using the distal internal carotid diameter as the denominator. CONTRAST:  75 cc Isovue 370 intravenous COMPARISON:  None. FINDINGS: CT head: Brain: No evidence of infarct, hemorrhage, hydrocephalus, or masslike finding. Vascular:  See below. Skull: Negative. Sinuses and orbits: Negative. CTA NECK FINDINGS Aortic arch: Atherosclerotic plaque on the aorta. Three vessel branching. No acute finding. Right carotid system: Mild partially calcified plaque at the common carotid bifurcation. No stenosis, ulceration, or dissection. Left carotid system: Prominent plaque at the CCA origin with ~50% luminal stenosis. Moderate peripherally calcified centrally low-density plaque at the common carotid bifurcation with 25% proximal ICA narrowing. No definite ulceration. There is mild motion artifact at the level of the left CCA bifurcation. Mild proximal ECA narrowing. Vertebral arteries: No proximal subclavian stenosis. Both vertebral arteries are smooth and widely patent to the dura. Skeleton: No acute or aggressive finding. Other neck: No incidental mass or inflammation noted. Upper chest: Chronic scar-like opacity at the left apex with calcification, also seen on chest CT 01/08/2015. Hazy appearance of the upper lungs which could be from atelectasis. Fissural fluid in the upper left major fissure. Chest x-ray performed today. Review of the MIP images confirms the above findings CTA HEAD FINDINGS Anterior circulation: Atherosclerotic plaque on the carotid siphons without stenosis or ulceration. No branch occlusion, beading, or aneurysm. Posterior circulation: Essentially codominant vertebral arteries. Vertebral and basilar arteries are smooth and widely patent Venous sinuses: Patent Anatomic variants: None significant Delayed phase: No abnormal intracranial enhancement. Review of the MIP images confirms the above findings IMPRESSION: 1. No acute finding.  No evidence of vertebrobasilar insufficiency. 2. Atherosclerosis, most notable in the left carotid circulation where there is 50% common carotid origins stenosis and 25% proximal ICA stenosis. 3. Electronically Signed   By: Monte Fantasia M.D.   On: 09/08/2016 14:27   Dg Chest 2 View  Result Date:  09/08/2016 CLINICAL DATA:  Hypoglycemia earlier today. History of dialysis dependent renal failure, CHF, COPD, former smoker. EXAM: CHEST  2 VIEW COMPARISON:  Chest x-ray of July 17, 2015 FINDINGS: The lungs are well-expanded. The interstitial markings are mildly increased. The pulmonary vascularity is mildly engorged. The cardiac silhouette is enlarged. The left hemidiaphragm is obscured. There is calcification in the wall of the aortic arch. There is some fluid in the major fissure presumably on the left. No significant costophrenic angle angle blunting is observed. IMPRESSION: Mild CHF.  No alveolar pneumonia nor large pleural effusion. Thoracic aortic atherosclerosis. Electronically Signed   By: Marylyn Appenzeller  [place] M.D.   On: 09/08/2016 13:40   Ct Angio Neck W And/or Wo Contrast  Result Date: 09/08/2016 CLINICAL DATA:  Persistent vertigo, central. EXAM: CT ANGIOGRAPHY HEAD AND NECK TECHNIQUE: Multidetector CT imaging of the head and neck was performed using the standard protocol during bolus administration of intravenous contrast. Multiplanar CT image reconstructions and MIPs were obtained to evaluate the vascular anatomy. Carotid stenosis measurements (when applicable) are obtained utilizing NASCET criteria, using the distal internal carotid diameter as the denominator. CONTRAST:  75 cc Isovue 370 intravenous COMPARISON:  None. FINDINGS: CT head: Brain: No evidence of infarct, hemorrhage, hydrocephalus, or masslike finding. Vascular: See below. Skull: Negative. Sinuses and orbits:  Negative. CTA NECK FINDINGS Aortic arch: Atherosclerotic plaque on the aorta. Three vessel branching. No acute finding. Right carotid system: Mild partially calcified plaque at the common carotid bifurcation. No stenosis, ulceration, or dissection. Left carotid system: Prominent plaque at the CCA origin with ~50% luminal stenosis. Moderate peripherally calcified centrally low-density plaque at the common carotid bifurcation with 25%  proximal ICA narrowing. No definite ulceration. There is mild motion artifact at the level of the left CCA bifurcation. Mild proximal ECA narrowing. Vertebral arteries: No proximal subclavian stenosis. Both vertebral arteries are smooth and widely patent to the dura. Skeleton: No acute or aggressive finding. Other neck: No incidental mass or inflammation noted. Upper chest: Chronic scar-like opacity at the left apex with calcification, also seen on chest CT 01/08/2015. Hazy appearance of the upper lungs which could be from atelectasis. Fissural fluid in the upper left major fissure. Chest x-ray performed today. Review of the MIP images confirms the above findings CTA HEAD FINDINGS Anterior circulation: Atherosclerotic plaque on the carotid siphons without stenosis or ulceration. No branch occlusion, beading, or aneurysm. Posterior circulation: Essentially codominant vertebral arteries. Vertebral and basilar arteries are smooth and widely patent Venous sinuses: Patent Anatomic variants: None significant Delayed phase: No abnormal intracranial enhancement. Review of the MIP images confirms the above findings IMPRESSION: 1. No acute finding.  No evidence of vertebrobasilar insufficiency. 2. Atherosclerosis, most notable in the left carotid circulation where there is 50% common carotid origins stenosis and 25% proximal ICA stenosis. 3. Electronically Signed   By: Monte Fantasia M.D.   On: 09/08/2016 14:27   Mr Brain Wo Contrast  Result Date: 09/08/2016 CLINICAL DATA:  Numbness or tingling. Paresthesias. Central vertigo. Hypoglycemia. EXAM: MRI HEAD WITHOUT CONTRAST TECHNIQUE: Multiplanar, multiecho pulse sequences of the brain and surrounding structures were obtained without intravenous contrast. COMPARISON:  CTA head and neck from the same day. FINDINGS: Brain: No acute infarct, hemorrhage, or mass lesion is present. The ventricles are of normal size. Study is mildly degraded by patient motion. Periventricular T2  changes are mildly advanced for age. White matter changes extend into the brainstem. The cerebellum is unremarkable. The internal auditory canals are within normal limits bilaterally. Vascular: Flow is present in the major intracranial arteries. Skull and upper cervical spine: The skullbase is within normal limits. The craniocervical junction is unremarkable. Midline sagittal structures are within normal limits. Sinuses/Orbits: The paranasal sinuses and mastoid air cells are clear. The globes and orbits are within normal limits. IMPRESSION: 1. Periventricular white matter changes are mildly advanced for age. White matter changes extend into the brainstem. This likely reflects the sequela of chronic microvascular ischemia. The finding is nonspecific. 2. No other acute or focal lesion to explain the patient's numbness or central vertigo. Electronically Signed   By: [place] More[place] M.D.   On: 09/08/2016 16:25    Assessment:   KAYONA FOOR is a 56 y.o. 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 Previously on Bactrim, azithromycin and doxycycline since August 9 however found to be resistant to all oral agents except clarithromycin/azithromyin. It was also sensitive to tobramycin. Now admitted with HD cath malfunction and started on PD. These infections tend to be relatively indolent and will need to be treated for 3-4 months in order to prevent progression down the tunnel and into active peritonitis.  Prefer to have 2 active agents for the duration of treatment.   Recommendations Continue azithromycin Add Tobramcyin - will ask pharmacy to assist dosing since she  will now be on PD until HD access site can be reestablished. Thank you very much for allowing me to participate in the care of this patient. Please call with questions.   Jacqueline Rodriguez. Ola Spurr, MD

## 2016-09-15 LAB — BASIC METABOLIC PANEL
ANION GAP: 14 (ref 5–15)
BUN: 84 mg/dL — ABNORMAL HIGH (ref 6–20)
CO2: 20 mmol/L — ABNORMAL LOW (ref 22–32)
Calcium: 8.6 mg/dL — ABNORMAL LOW (ref 8.9–10.3)
Chloride: 96 mmol/L — ABNORMAL LOW (ref 101–111)
Creatinine, Ser: 12.25 mg/dL — ABNORMAL HIGH (ref 0.44–1.00)
GFR, EST AFRICAN AMERICAN: 3 mL/min — AB (ref >60)
GFR, EST NON AFRICAN AMERICAN: 3 mL/min — AB (ref >60)
Glucose, Bld: 181 mg/dL — ABNORMAL HIGH (ref 65–99)
POTASSIUM: 6.3 mmol/L — AB (ref 3.5–5.1)
SODIUM: 130 mmol/L — AB (ref 135–145)

## 2016-09-15 LAB — GLUCOSE, CAPILLARY
GLUCOSE-CAPILLARY: 185 mg/dL — AB (ref 65–99)
GLUCOSE-CAPILLARY: 110 mg/dL — AB (ref 65–99)
Glucose-Capillary: 99 mg/dL (ref 65–99)
Glucose-Capillary: 237 mg/dL — ABNORMAL HIGH (ref 65–99)

## 2016-09-15 LAB — HIV ANTIBODY (ROUTINE TESTING W REFLEX): HIV SCREEN 4TH GENERATION: NONREACTIVE

## 2016-09-15 MED ORDER — FUROSEMIDE 40 MG PO TABS
40.0000 mg | ORAL_TABLET | Freq: Every day | ORAL | Status: DC
  Administered 2016-09-15: 40 mg via ORAL
  Filled 2016-09-15: qty 1

## 2016-09-15 MED ORDER — SODIUM POLYSTYRENE SULFONATE 15 GM/60ML PO SUSP
60.0000 g | Freq: Once | ORAL | Status: AC
Start: 2016-09-15 — End: 2016-09-15
  Administered 2016-09-15: 60 g via ORAL
  Filled 2016-09-15: qty 240

## 2016-09-15 MED ORDER — SODIUM CHLORIDE 0.9 % IV SOLN
1.0000 g | Freq: Once | INTRAVENOUS | Status: AC
  Administered 2016-09-15: 1 g via INTRAVENOUS
  Filled 2016-09-15: qty 10

## 2016-09-15 MED ORDER — TOBRAMYCIN SULFATE 80 MG/2ML IJ SOLN
210.0000 mg | Freq: Once | INTRAVENOUS | Status: AC
  Administered 2016-09-15: 210 mg via INTRAVENOUS
  Filled 2016-09-15: qty 5.25

## 2016-09-15 MED ORDER — MENTHOL 3 MG MT LOZG
1.0000 | LOZENGE | OROMUCOSAL | Status: DC | PRN
  Administered 2016-09-15: 3 mg via ORAL
  Filled 2016-09-15: qty 9

## 2016-09-15 NOTE — Progress Notes (Signed)
[facility] Cardiology [facility] Encounter Note  Patient: Jacqueline Rodriguez / Admit Date: 09/13/2016 / Date of Encounter: 09/15/2016, 1:03 PM   Subjective: Patient started on peritoneal dialysis without event. Potassium more elevated at 6.3 although possibly secondary to ACE inhibitor use. Lasix started for the possibility of the help with hyperkalemia. Blood pressure still somewhat elevated likely due to volume overload. Bradycardia improved at this time without any further significant symptoms  Review of Systems: Positive for: Weakness Negative for: Vision change, hearing change, syncope, dizziness, nausea, vomiting,diarrhea, bloody stool, stomach pain, cough, congestion, diaphoresis, urinary frequency, urinary pain,skin lesions, skin rashes Others previously listed  Objective: Telemetry: Normal sinus rhythm Physical Exam: Blood pressure (!) 172/42, pulse (!) 56, temperature 98.1 F (36.7 C), temperature source Oral, resp. rate 19, height 5\' 9"  (1.753 m), weight 110.3 kg (243 lb 1.6 oz), last menstrual period 09/18/2013, SpO2 97 %. Body mass index is 35.9 kg/m. General: Well developed, well nourished, in no acute distress. Head: Normocephalic, atraumatic, sclera non-icteric, no xanthomas, nares are without discharge. Neck: No apparent masses Lungs: Normal respirations with no wheezes, no rhonchi, no rales , no crackles   Heart: Regular rate and rhythm, normal S1 S2, no murmur, no rub, no gallop, PMI is normal size and placement, carotid upstroke normal without bruit, jugular venous pressure normal Abdomen: Soft, non-tender, non-distended with normoactive bowel sounds. No hepatosplenomegaly. Abdominal aorta is normal size without bruit Extremities: No edema, no clubbing, no cyanosis, no ulcers,  Peripheral: 2+ radial, 2+ femoral, 2+ dorsal pedal pulses Neuro: Alert and oriented. Moves all extremities spontaneously. Psych:  Responds to questions appropriately with a normal  affect.   Intake/Output Summary (Last 24 hours) at 09/15/16 1303 Last data filed at 09/15/16 1010  Gross per 24 hour  Intake              120 ml  Output              587 ml  Net             -467 ml    Inpatient Medications:  . amLODipine  5 mg Oral Daily  . aspirin EC  81 mg Oral Daily  . azithromycin  250 mg Oral Daily  . calcium acetate  2,001 mg Oral TID WC  . calcium-vitamin D  1 tablet Oral Daily  . docusate sodium  100 mg Oral BID  . ferrous sulfate  325 mg Oral BID  . furosemide  40 mg Oral Daily  . gabapentin  100 mg Oral TID  . gentamicin cream  1 application Topical Daily  . heparin  5,000 Units Subcutaneous Q8H  . hydrALAZINE  25 mg Oral TID  . insulin aspart  0-9 Units Subcutaneous TID WC  . isosorbide mononitrate  30 mg Oral Daily  . magnesium oxide  400 mg Oral Daily  . patiromer  8.4 g Oral Daily   Infusions:  . calcium gluconate 1 g (09/15/16 1231)    Labs:  Recent Labs  09/14/16 0327 09/15/16 0954  NA 131* 130*  K 5.7* 6.3*  CL 99* 96*  CO2 18* 20*  GLUCOSE 113* 181*  BUN 91* 84*  CREATININE 12.13* 12.25*  CALCIUM 8.5* 8.6*   No results for input(s): AST, ALT, ALKPHOS, BILITOT, PROT, ALBUMIN in the last 72 hours.  Recent Labs  09/13/16 1453 09/14/16 0327  WBC 6.8 7.6  NEUTROABS 5.3  --   HGB 8.6* 8.1*  HCT 26.1* 24.2*  MCV 96.0 94.0  PLT 168 162   No results for input(s): CKTOTAL, CKMB, TROPONINI in the last 72 hours. Invalid input(s): POCBNP No results for input(s): HGBA1C in the last 72 hours.   Weights: Filed Weights   09/13/16 1840 09/14/16 0500 09/15/16 0342  Weight: 103.9 kg (229 lb) 106.4 kg (234 lb 8 oz) 110.3 kg (243 lb 1.6 oz)     Radiology/Studies:  Ct Angio Head W Or Wo Contrast  Result Date: 09/08/2016 CLINICAL DATA:  Persistent vertigo, central. EXAM: CT ANGIOGRAPHY HEAD AND NECK TECHNIQUE: Multidetector CT imaging of the head and neck was performed using the standard protocol during bolus administration of  intravenous contrast. Multiplanar CT image reconstructions and MIPs were obtained to evaluate the vascular anatomy. Carotid stenosis measurements (when applicable) are obtained utilizing NASCET criteria, using the distal internal carotid diameter as the denominator. CONTRAST:  75 cc Isovue 370 intravenous COMPARISON:  None. FINDINGS: CT head: Brain: No evidence of infarct, hemorrhage, hydrocephalus, or masslike finding. Vascular: See below. Skull: Negative. Sinuses and orbits: Negative. CTA NECK FINDINGS Aortic arch: Atherosclerotic plaque on the aorta. Three vessel branching. No acute finding. Right carotid system: Mild partially calcified plaque at the common carotid bifurcation. No stenosis, ulceration, or dissection. Left carotid system: Prominent plaque at the CCA origin with ~50% luminal stenosis. Moderate peripherally calcified centrally low-density plaque at the common carotid bifurcation with 25% proximal ICA narrowing. No definite ulceration. There is mild motion artifact at the level of the left CCA bifurcation. Mild proximal ECA narrowing. Vertebral arteries: No proximal subclavian stenosis. Both vertebral arteries are smooth and widely patent to the dura. Skeleton: No acute or aggressive finding. Other neck: No incidental mass or inflammation noted. Upper chest: Chronic scar-like opacity at the left apex with calcification, also seen on chest CT 01/08/2015. Hazy appearance of the upper lungs which could be from atelectasis. Fissural fluid in the upper left major fissure. Chest x-ray performed today. Review of the MIP images confirms the above findings CTA HEAD FINDINGS Anterior circulation: Atherosclerotic plaque on the carotid siphons without stenosis or ulceration. No branch occlusion, beading, or aneurysm. Posterior circulation: Essentially codominant vertebral arteries. Vertebral and basilar arteries are smooth and widely patent Venous sinuses: Patent Anatomic variants: None significant Delayed  phase: No abnormal intracranial enhancement. Review of the MIP images confirms the above findings IMPRESSION: 1. No acute finding.  No evidence of vertebrobasilar insufficiency. 2. Atherosclerosis, most notable in the left carotid circulation where there is 50% common carotid origins stenosis and 25% proximal ICA stenosis. 3. Electronically Signed   By: Monte Fantasia M.D.   On: 09/08/2016 14:27   Dg Chest 2 View  Result Date: 09/08/2016 CLINICAL DATA:  Hypoglycemia earlier today. History of dialysis dependent renal failure, CHF, COPD, former smoker. EXAM: CHEST  2 VIEW COMPARISON:  Chest x-ray of July 17, 2015 FINDINGS: The lungs are well-expanded. The interstitial markings are mildly increased. The pulmonary vascularity is mildly engorged. The cardiac silhouette is enlarged. The left hemidiaphragm is obscured. There is calcification in the wall of the aortic arch. There is some fluid in the major fissure presumably on the left. No significant costophrenic angle angle blunting is observed. IMPRESSION: Mild CHF.  No alveolar pneumonia nor large pleural effusion. Thoracic aortic atherosclerosis. Electronically Signed   By: David  Martinique M.D.   On: 09/08/2016 13:40   Ct Angio Neck W And/or Wo Contrast  Result Date: 09/08/2016 CLINICAL DATA:  Persistent vertigo, central. EXAM: CT ANGIOGRAPHY HEAD AND NECK TECHNIQUE: Multidetector CT imaging of the  head and neck was performed using the standard protocol during bolus administration of intravenous contrast. Multiplanar CT image reconstructions and MIPs were obtained to evaluate the vascular anatomy. Carotid stenosis measurements (when applicable) are obtained utilizing NASCET criteria, using the distal internal carotid diameter as the denominator. CONTRAST:  75 cc Isovue 370 intravenous COMPARISON:  None. FINDINGS: CT head: Brain: No evidence of infarct, hemorrhage, hydrocephalus, or masslike finding. Vascular: See below. Skull: Negative. Sinuses and orbits:  Negative. CTA NECK FINDINGS Aortic arch: Atherosclerotic plaque on the aorta. Three vessel branching. No acute finding. Right carotid system: Mild partially calcified plaque at the common carotid bifurcation. No stenosis, ulceration, or dissection. Left carotid system: Prominent plaque at the CCA origin with ~50% luminal stenosis. Moderate peripherally calcified centrally low-density plaque at the common carotid bifurcation with 25% proximal ICA narrowing. No definite ulceration. There is mild motion artifact at the level of the left CCA bifurcation. Mild proximal ECA narrowing. Vertebral arteries: No proximal subclavian stenosis. Both vertebral arteries are smooth and widely patent to the dura. Skeleton: No acute or aggressive finding. Other neck: No incidental mass or inflammation noted. Upper chest: Chronic scar-like opacity at the left apex with calcification, also seen on chest CT 01/08/2015. Hazy appearance of the upper lungs which could be from atelectasis. Fissural fluid in the upper left major fissure. Chest x-ray performed today. Review of the MIP images confirms the above findings CTA HEAD FINDINGS Anterior circulation: Atherosclerotic plaque on the carotid siphons without stenosis or ulceration. No branch occlusion, beading, or aneurysm. Posterior circulation: Essentially codominant vertebral arteries. Vertebral and basilar arteries are smooth and widely patent Venous sinuses: Patent Anatomic variants: None significant Delayed phase: No abnormal intracranial enhancement. Review of the MIP images confirms the above findings IMPRESSION: 1. No acute finding.  No evidence of vertebrobasilar insufficiency. 2. Atherosclerosis, most notable in the left carotid circulation where there is 50% common carotid origins stenosis and 25% proximal ICA stenosis. 3. Electronically Signed   By: Monte Fantasia M.D.   On: 09/08/2016 14:27   Mr Brain Wo Contrast  Result Date: 09/08/2016 CLINICAL DATA:  Numbness or  tingling. Paresthesias. Central vertigo. Hypoglycemia. EXAM: MRI HEAD WITHOUT CONTRAST TECHNIQUE: Multiplanar, multiecho pulse sequences of the brain and surrounding structures were obtained without intravenous contrast. COMPARISON:  CTA head and neck from the same day. FINDINGS: Brain: No acute infarct, hemorrhage, or mass lesion is present. The ventricles are of normal size. Study is mildly degraded by patient motion. Periventricular T2 changes are mildly advanced for age. White matter changes extend into the brainstem. The cerebellum is unremarkable. The internal auditory canals are within normal limits bilaterally. Vascular: Flow is present in the major intracranial arteries. Skull and upper cervical spine: The skullbase is within normal limits. The craniocervical junction is unremarkable. Midline sagittal structures are within normal limits. Sinuses/Orbits: The paranasal sinuses and mastoid air cells are clear. The globes and orbits are within normal limits. IMPRESSION: 1. Periventricular white matter changes are mildly advanced for age. White matter changes extend into the brainstem. This likely reflects the sequela of chronic microvascular ischemia. The finding is nonspecific. 2. No other acute or focal lesion to explain the patient's numbness or central vertigo. Electronically Signed   By: San Morelle M.D.   On: 09/08/2016 16:25     Assessment and Recommendation  56 y.o. female with apparent previous diastolic dysfunction congestive heart failure anemia chronic kidney disease stage V peripheral vascular disease diabetes with complication with junctional rhythm and symptomatic bradycardia now  improved to normal sinus rhythm without need for further intervention 1. No further cardiac intervention at this time due to no significant symptoms from junctional rhythm and/or bradycardia 2. Watch closely with telemetry for reemergence of bradycardia possibly secondary to hyperkalemia 3. Supportive  care for chronic kidney disease stage V and peritoneal dialysis 4. Hypertension control with hydralazine and amlodipine 5. Avoid ACE inhibitor due to hyperkalemia 6. Avoid beta blockers due to bradycardia currently asymptomatic  Signed, Serafina Royals M.D. FACC

## 2016-09-15 NOTE — Progress Notes (Signed)
CCPD completed without issue. UF 537 ml. No alarms. Patient tolerated well. Report given to primary RN and MD notified.

## 2016-09-15 NOTE — Progress Notes (Signed)
Post ccpd assessment unchanged

## 2016-09-15 NOTE — Progress Notes (Signed)
PD start with no complications. Alert, no c/o.

## 2016-09-15 NOTE — Progress Notes (Signed)
Patient is refusing bed alarm.  Risks were explained to patient; she verbalized understanding and reiterated that she did not wish bed alarm on.

## 2016-09-15 NOTE — Care Management (Signed)
Jacqueline Rodriguez HD liaison is aware of admission.  Patient was receiving HD prior to admission, however has been receiving PD while inpatient.  [name] to update RNCM on discharge disposition.

## 2016-09-15 NOTE — Progress Notes (Signed)
Jacqueline Rodriguez Date of Admission:  09/13/2016     ID: Jacqueline Rodriguez is a 56 y.o. female with PD cath exit site infection  Active Problems:   Hyperkalemia   Hypocalcemia  Subjective: No fevers  ROS  Eleven systems are reviewed and negative except per hpi  Medications:  Antibiotics Given (last 72 hours)    Date/Time Action Medication Dose Rate   09/15/16 1028 New Bag/Given   tobramycin (NEBCIN) 210 mg in dextrose 5 % 50 mL IVPB 210 mg 110.5 mL/hr   09/15/16 1029 Given   azithromycin (ZITHROMAX) tablet 250 mg 250 mg      . amLODipine  5 mg Oral Daily  . aspirin EC  81 mg Oral Daily  . azithromycin  250 mg Oral Daily  . calcium acetate  2,001 mg Oral TID WC  . calcium-vitamin D  1 tablet Oral Daily  . docusate sodium  100 mg Oral BID  . ferrous sulfate  325 mg Oral BID  . furosemide  40 mg Oral Daily  . gabapentin  100 mg Oral TID  . gentamicin cream  1 application Topical Daily  . heparin  5,000 Units Subcutaneous Q8H  . hydrALAZINE  25 mg Oral TID  . insulin aspart  0-9 Units Subcutaneous TID WC  . isosorbide mononitrate  30 mg Oral Daily  . magnesium oxide  400 mg Oral Daily  . patiromer  8.4 g Oral Daily    Objective: Vital signs in last 24 hours: Temp:  [98 F (36.7 C)-98.1 F (36.7 C)] 98.1 F (36.7 C) (09/05 0342) Pulse Rate:  [51-56] 56 (09/05 0951) Resp:  [19-20] 19 (09/05 0951) BP: (139-177)/(42-53) 172/42 (09/05 0951) SpO2:  [94 %-97 %] 97 % (09/05 0951) Weight:  [110.3 kg (243 lb 1.6 oz)] 110.3 kg (243 lb 1.6 oz) (09/05 0342) GENERAL: The patient is alert, oriented and in no acute distress.  HEENT: PERRLA. Oropharynx moist without lesions. Head is normocephalic/atraumatic. Pupils equal, round and reactive to light and accommodation. Extraocular movements are intact. Nasal mucosa is normal. External ear canals normal. Tympanic membranes are clear.  NECK: Supple without thyromegaly or lymphadenopathy.  LUNGS: Clear to  auscultation and percussion.  CARDIAC: Regular rate and rhythm, normal S1 and S2 without murmurs, rubs or gallops.  VASCULAR: Carotid and radial pulses 2+. Distal pulses are 2+.  ABDOMEN: Soft, with normal bowel sounds. No organomegaly or tenderness was found. Large pannus. Her Peritoneal dialysis catheter is clean dry and intact there is no drainage or redness. No tenderness. EXTREMITIES: Full range of motion with no erythema, heat or effusions. No cyanosis, clubbing or edema noted.  NEUROLOGIC: The patient is alert and oriented. Cranial nerves II-XII intact. Motor and sensory examination within normal limits. Gait normal. Reflexes symmetric and brisk.  DERMATOLOGIC: No skin lesions, scaling or bruising noted.  LYMPH: No cervical or supraclavicular nodes.   Lab Results  Recent Labs  09/13/16 1453 09/14/16 0327 09/15/16 0954  WBC 6.8 7.6  --   HGB 8.6* 8.1*  --   HCT 26.1* 24.2*  --   NA 130* 131* 130*  K 5.7* 5.7* 6.3*  CL 99* 99* 96*  CO2 17* 18* 20*  BUN 93* 91* 84*  CREATININE 12.44* 12.13* 12.25*    Microbiology: Results for orders placed or performed during the hospital encounter of 09/08/16  Urine culture     Status: Abnormal   Collection Time: 09/08/16 12:27 PM  Result Value Ref Range Status  Specimen Description URINE, RANDOM  Final   Special Requests NONE  Final   Culture MULTIPLE SPECIES PRESENT, SUGGEST RECOLLECTION (A)  Final   Report Status 09/10/2016 FINAL  Final  MRSA PCR Screening     Status: None   Collection Time: 09/08/16  6:05 PM  Result Value Ref Range Status   MRSA by PCR NEGATIVE NEGATIVE Final    Comment:        The GeneXpert MRSA Assay (FDA approved for NASAL specimens only), is one component of a comprehensive MRSA colonization surveillance program. It is not intended to diagnose MRSA infection nor to guide or monitor treatment for MRSA infections.     Studies/Results: No results found.  Assessment/Plan: Jacqueline Rodriguez is a 56 y.o.  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 Previously on Bactrim, azithromycin and doxycycline since August 9 however found to be resistant to all oral agents except clarithromycin/azithromyin. It was also sensitive to tobramycin. Now admitted with HD cath malfunction and started on PD.  These infections tend to be relatively indolent and will need to be treated for 3-4 months in order to prevent progression down the tunnel and into active peritonitis.  Prefer to have 2 active agents for the duration of treatment.   Recommendations Continue azithromycin Continue Tobramcyin - will ask pharmacy to assist dosing since she will now be on PD until HD access site can be reestablished. I am not sure if can get systemic levels with the PD instillation of the tobra but will check with Pharmacy Thank you very much for the consult. Will follow with you.  Leonel Ramsay   09/15/2016, 2:15 PM

## 2016-09-15 NOTE — Progress Notes (Addendum)
CRITICAL VALUE ALERT  Critical Value:  K 6.3  Date & Time Notied:  09/15/16 11:53 a.m.  Provider Notified: Dr. Leslye Peer

## 2016-09-15 NOTE — Progress Notes (Signed)
[facility]  ROUNDING NOTE   Subjective:   Peritoneal dialysis last night. Tolerated treatment well. UF 537mL.   Potassium 6.3 - continues to be in junctional rhythm.   Objective:  Vital signs in last 24 hours:  Temp:  [98 F (36.7 C)-98.1 F (36.7 C)] 98.1 F (36.7 C) (09/05 0342) Pulse Rate:  [51-60] 56 (09/05 0951) Resp:  [19-20] 19 (09/05 0951) BP: (139-177)/(42-57) 172/42 (09/05 0951) SpO2:  [94 %-99 %] 97 % (09/05 0951) Weight:  [110.3 kg (243 lb 1.6 oz)] 110.3 kg (243 lb 1.6 oz) (09/05 0342)  Weight change: 6.396 kg (14 lb 1.6 oz) Filed Weights   09/13/16 1840 09/14/16 0500 09/15/16 0342  Weight: 103.9 kg (229 lb) 106.4 kg (234 lb 8 oz) 110.3 kg (243 lb 1.6 oz)    Intake/Output: I/O last 3 completed shifts: In: 360 [P.O.:360] Out: 412 [Urine:225; Other:187]   Intake/Output this shift:  Total I/O In: -  Out: 587 [Urine:50; Other:537]  Physical Exam: General: No acute distress  Head: Normocephalic, atraumatic. Moist oral mucosal membranes  Eyes: Anicteric  Neck: Supple, trachea midline  Lungs:  Clear   Heart: +systolic murmur  Abdomen:  Soft, nontender, bowel sounds present  Extremities: no peripheral edema.  Neurologic: Awake, alert, following commands  Skin: No lesions  Access: +thrill, +bruit left upper extremity AVF peritoneal dialysis catheter in place     Basic Metabolic Panel:  Recent Labs Lab 09/08/16 1227 09/13/16 1453 09/14/16 0327 09/15/16 0954  NA 129* 130* 131* 130*  K 5.0 5.7* 5.7* 6.3*  CL 98* 99* 99* 96*  CO2 18* 17* 18* 20*  GLUCOSE 122* 170* 113* 181*  BUN 61* 93* 91* 84*  CREATININE 8.48* 12.44* 12.13* 12.25*  CALCIUM 7.6* 7.2* 8.5* 8.6*  MG 1.9  --   --   --     Liver Function Tests: No results for input(s): AST, ALT, ALKPHOS, BILITOT, PROT, ALBUMIN in the last 168 hours. No results for input(s): LIPASE, AMYLASE in the last 168 hours. No results for input(s): AMMONIA in the last 168  hours.  CBC:  Recent Labs Lab 09/08/16 1227 09/13/16 1453 09/14/16 0327  WBC 6.1 6.8 7.6  NEUTROABS  --  5.3  --   HGB 9.9* 8.6* 8.1*  HCT 29.1* 26.1* 24.2*  MCV 95.0 96.0 94.0  PLT 153 168 162    Cardiac Enzymes: No results for input(s): CKTOTAL, CKMB, CKMBINDEX, TROPONINI in the last 168 hours.  BNP: Invalid input(s): POCBNP  CBG:  Recent Labs Lab 09/14/16 0725 09/14/16 1143 09/14/16 1656 09/14/16 2142 09/15/16 0740  GLUCAP 109* 161* 97 254* 99    Microbiology: Results for orders placed or performed during the hospital encounter of 09/08/16  Urine culture     Status: Abnormal   Collection Time: 09/08/16 12:27 PM  Result Value Ref Range Status   Specimen Description URINE, RANDOM  Final   Special Requests NONE  Final   Culture MULTIPLE SPECIES PRESENT, SUGGEST RECOLLECTION (A)  Final   Report Status 09/10/2016 FINAL  Final  MRSA PCR Screening     Status: None   Collection Time: 09/08/16  6:05 PM  Result Value Ref Range Status   MRSA by PCR NEGATIVE NEGATIVE Final    Comment:        The GeneXpert MRSA Assay (FDA approved for NASAL specimens only), is one component of a comprehensive MRSA colonization surveillance program. It is not intended to diagnose MRSA infection nor to guide or monitor treatment for  MRSA infections.     Coagulation Studies: No results for input(s): LABPROT, INR in the last 72 hours.  Urinalysis: No results for input(s): COLORURINE, LABSPEC, PHURINE, GLUCOSEU, HGBUR, BILIRUBINUR, KETONESUR, PROTEINUR, UROBILINOGEN, NITRITE, LEUKOCYTESUR in the last 72 hours.  Invalid input(s): APPERANCEUR    Imaging: No results found.   Medications:    . amLODipine  5 mg Oral Daily  . aspirin EC  81 mg Oral Daily  . azithromycin  250 mg Oral Daily  . calcium acetate  2,001 mg Oral TID WC  . calcium-vitamin D  1 tablet Oral Daily  . docusate sodium  100 mg Oral BID  . ferrous sulfate  325 mg Oral BID  . gabapentin  100 mg Oral TID   . gentamicin cream  1 application Topical Daily  . heparin  5,000 Units Subcutaneous Q8H  . hydrALAZINE  25 mg Oral TID  . insulin aspart  0-9 Units Subcutaneous TID WC  . isosorbide mononitrate  30 mg Oral Daily  . lisinopril  10 mg Oral Daily  . magnesium oxide  400 mg Oral Daily  . patiromer  8.4 g Oral Daily   acetaminophen **OR** acetaminophen, albuterol, bisacodyl, heparin, HYDROcodone-acetaminophen, loratadine, meclizine, ondansetron **OR** ondansetron (ZOFRAN) IV, traZODone  Assessment/ Plan:  56 y.o. white female with diastolic congestive heart failure, hypertension, diabetes mellitus type II, diabetic retinopathy, diabetic neuropathy, ESRD on HD MWF, anemia of CKD, SHPTH  [facility]/MWF/[place]  1. ESRD on HD MWF: with hyperkalemia. Transitioned to peritoneal dialysis due to nonfunctioning AVF for hemodialysis. Patient tolerated treatment well.   - Will plan on peritoneal dialysis again tonight: prescription of CCPD 8 hours 4 cycles with 2.5 liter fills. 2.5% dextrose - Complication with hemodialysis access: consult vascular surgery for angiogram/fistulogram.  - Veltassa for potassium binding. One time dose of kayexalate and calcium gluconate - Discontinue lisinopril due to hyperkalemia  2. Hypertension: elevated.  - amlodipine, hydralazine, imdur.  - Add furosemide 40mg  PO daily  3. Anemia of chronic kidney disease.  Hemoglobin 8.1 - EPO to be ordered for tomorrow (9/6)   4. Secondary hyperparathyroidism with hypocalcemia and hyperphosphatemia - calcium acetate with meals.   5. Diabetes mellitus type II with chronic kidney disease: Hemoglobin A1c 5.8% on 08/02/16 - sliding scale insulin   LOS: 2 Sidonia Nutter 9/5/201812:00 PM

## 2016-09-15 NOTE — Progress Notes (Signed)
PD START

## 2016-09-15 NOTE — Progress Notes (Signed)
Patient ID: Jacqueline Rodriguez, female   DOB: 30-Mar-1960, 56 y.o.   MRN: [id number]   [facility] PROGRESS NOTE  Jacqueline Rodriguez KGU:[id number] DOB: [birth year] DOA: 09/13/2016 PCP: [facility]  HPI/Subjective: Patient seen early in woken up from sleep. She feels okay.  Offered no complaints. Potassium still high again today.  Objective: Vitals:   09/15/16 0342 09/15/16 0951  BP: (!) 139/43 (!) 172/42  Pulse: (!) 53 (!) 56  Resp: 20 19  Temp: 98.1 F (36.7 C)   SpO2: 94% 97%    Filed Weights   09/13/16 1840 09/14/16 0500 09/15/16 0342  Weight: 103.9 kg (229 lb) 106.4 kg (234 lb 8 oz) 110.3 kg (243 lb 1.6 oz)    ROS: Review of Systems  Constitutional: Negative for chills and fever.  Eyes: Negative for blurred vision.  Respiratory: Negative for cough and shortness of breath.   Cardiovascular: Negative for chest pain.  Gastrointestinal: Negative for abdominal pain, constipation, diarrhea, nausea and vomiting.  Genitourinary: Negative for dysuria.  Musculoskeletal: Negative for joint pain.  Neurological: Negative for dizziness and headaches.   Exam: Physical Exam  HENT:  Nose: No mucosal edema.  Mouth/Throat: No oropharyngeal exudate or posterior oropharyngeal edema.  Eyes: Pupils are equal, round, and reactive to light. Conjunctivae, EOM and lids are normal.  Neck: No JVD present. Carotid bruit is not present. No edema present. No thyroid mass and no thyromegaly present.  Cardiovascular: S1 normal and S2 normal.  Exam reveals no gallop.   No murmur heard. Pulses:      Dorsalis pedis pulses are 2+ on the right side, and 2+ on the left side.  Respiratory: No respiratory distress. She has no wheezes. She has no rhonchi. She has no rales.  GI: Soft. Bowel sounds are normal. There is no tenderness.  Musculoskeletal:       Right ankle: She exhibits no swelling.       Left ankle: She exhibits no swelling.  Lymphadenopathy:    She has no cervical adenopathy.   Neurological: She is alert. No cranial nerve deficit.  Skin: Skin is warm. No rash noted. Nails show no clubbing.  Psychiatric: She has a normal mood and affect.      Data Reviewed: Basic Metabolic Panel:  Recent Labs Lab 09/13/16 1453 09/14/16 0327 09/15/16 0954  NA 130* 131* 130*  K 5.7* 5.7* 6.3*  CL 99* 99* 96*  CO2 17* 18* 20*  GLUCOSE 170* 113* 181*  BUN 93* 91* 84*  CREATININE 12.44* 12.13* 12.25*  CALCIUM 7.2* 8.5* 8.6*   CBC:  Recent Labs Lab 09/13/16 1453 09/14/16 0327  WBC 6.8 7.6  NEUTROABS 5.3  --   HGB 8.6* 8.1*  HCT 26.1* 24.2*  MCV 96.0 94.0  PLT 168 162    CBG:  Recent Labs Lab 09/14/16 1143 09/14/16 1656 09/14/16 2142 09/15/16 0740 09/15/16 1156  GLUCAP 161* 97 254* 99 185*    Recent Results (from the past 240 hour(s))  Urine culture     Status: Abnormal   Collection Time: 09/08/16 12:27 PM  Result Value Ref Range Status   Specimen Description URINE, RANDOM  Final   Special Requests NONE  Final   Culture MULTIPLE SPECIES PRESENT, SUGGEST RECOLLECTION (A)  Final   Report Status 09/10/2016 FINAL  Final  MRSA PCR Screening     Status: None   Collection Time: 09/08/16  6:05 PM  Result Value Ref Range Status   MRSA by PCR NEGATIVE NEGATIVE Final  Comment:        The GeneXpert MRSA Assay (FDA approved for NASAL specimens only), is one component of a comprehensive MRSA colonization surveillance program. It is not intended to diagnose MRSA infection nor to guide or monitor treatment for MRSA infections.       Scheduled Meds: . amLODipine  5 mg Oral Daily  . aspirin EC  81 mg Oral Daily  . azithromycin  250 mg Oral Daily  . calcium acetate  2,001 mg Oral TID WC  . calcium-vitamin D  1 tablet Oral Daily  . docusate sodium  100 mg Oral BID  . ferrous sulfate  325 mg Oral BID  . furosemide  40 mg Oral Daily  . gabapentin  100 mg Oral TID  . gentamicin cream  1 application Topical Daily  . heparin  5,000 Units  Subcutaneous Q8H  . hydrALAZINE  25 mg Oral TID  . insulin aspart  0-9 Units Subcutaneous TID WC  . isosorbide mononitrate  30 mg Oral Daily  . magnesium oxide  400 mg Oral Daily  . patiromer  8.4 g Oral Daily    Assessment/Plan:  1. Hyperkalemia did not improve with peritoneal dialysis last night. Will again have peritoneal dialysis tonight. Kayexalate ordered. I gave IV calcium.case discussed with nephrology. 2. Clotted vascular graft. The patient will need the graft unclotted versus a permacath placement. Patient will need hemodialysis until peritoneal dialysis teaching able to be done as outpatient. 3. Essential hypertension on amlodipine, imdur and lisinopril 4. Diabetic neuropathy on gabapentin 5. Type 2 diabetes mellitus on sliding scale 6. Chronic diastolic congestive heart failure. This will be harder to manage on peritoneal dialysis. 7. Junctional rhythm on telemetry monitoring. Corrected potassium. Case discussed with cardiology and since the patient is asymptomatic no further treatment.  Code Status:     Code Status Orders        Start     Ordered   09/13/16 1628  Full code  Continuous     09/13/16 1629    Code Status History    Date Active Date Inactive Code Status Order ID Comments User Context   09/08/2016  4:31 PM 09/09/2016  6:23 PM Full Code [id number]  Fritzi Mandes, MD Inpatient   07/17/2015 12:44 PM 07/24/2015  7:11 PM Full Code [id number]  Epifanio Lesches, MD ED   03/15/2015  7:56 PM 03/20/2015  3:48 PM Full Code [id number]  Idelle Crouch, MD Inpatient     Disposition Plan: potassium must be better prior to disposition  Consultants:  Nephrology  Vascular surgery  Time spent: 26 minutes  [place], [facility]

## 2016-09-16 ENCOUNTER — Encounter
Admission: EM | Disposition: A | Discharge status: Home / Self Care | Payer: Self-pay | Source: Home / Self Care | Attending: Internal Medicine

## 2016-09-16 DIAGNOSIS — N186 End stage renal disease: Secondary | ICD-10-CM

## 2016-09-16 HISTORY — PX: DIALYSIS/PERMA CATHETER INSERTION: CATH118288

## 2016-09-16 HISTORY — PX: A/V FISTULAGRAM: CATH118298

## 2016-09-16 LAB — GLUCOSE, CAPILLARY
GLUCOSE-CAPILLARY: 93 mg/dL (ref 65–99)
GLUCOSE-CAPILLARY: 123 mg/dL — AB (ref 65–99)
GLUCOSE-CAPILLARY: 162 mg/dL — AB (ref 65–99)
Glucose-Capillary: 125 mg/dL — ABNORMAL HIGH (ref 65–99)
Glucose-Capillary: 93 mg/dL (ref 65–99)

## 2016-09-16 LAB — BASIC METABOLIC PANEL
Anion gap: 14 (ref 5–15)
BUN: 77 mg/dL — AB (ref 6–20)
CHLORIDE: 97 mmol/L — AB (ref 101–111)
CO2: 24 mmol/L (ref 22–32)
Calcium: 8.2 mg/dL — ABNORMAL LOW (ref 8.9–10.3)
Creatinine, Ser: 12.03 mg/dL — ABNORMAL HIGH (ref 0.44–1.00)
GFR calc Af Amer: 3 mL/min — ABNORMAL LOW (ref >60)
GFR calc non Af Amer: 3 mL/min — ABNORMAL LOW (ref >60)
GLUCOSE: 88 mg/dL (ref 65–99)
POTASSIUM: 4.6 mmol/L (ref 3.5–5.1)
Sodium: 135 mmol/L (ref 135–145)

## 2016-09-16 LAB — TOBRAMYCIN LEVEL, RANDOM: TOBRAMYCIN RM: 6.1 ug/mL

## 2016-09-16 SURGERY — A/V FISTULAGRAM
Anesthesia: Moderate Sedation

## 2016-09-16 MED ORDER — HEPARIN (PORCINE) IN NACL 2-0.9 UNIT/ML-% IJ SOLN
INTRAMUSCULAR | Status: AC
  Filled 2016-09-16: qty 500

## 2016-09-16 MED ORDER — HEPARIN SODIUM (PORCINE) 10000 UNIT/ML IJ SOLN
INTRAMUSCULAR | Status: AC
  Filled 2016-09-16: qty 1

## 2016-09-16 MED ORDER — LIDOCAINE-EPINEPHRINE (PF) 2 %-1:200000 IJ SOLN
INTRAMUSCULAR | Status: AC
  Filled 2016-09-16: qty 20

## 2016-09-16 MED ORDER — SODIUM CHLORIDE 0.9 % IV SOLN
INTRAVENOUS | Status: DC
  Administered 2016-09-16: 10:00:00 via INTRAVENOUS

## 2016-09-16 MED ORDER — MIDAZOLAM HCL 2 MG/2ML IJ SOLN
INTRAMUSCULAR | Status: DC | PRN
  Administered 2016-09-16: 1 mg via INTRAVENOUS

## 2016-09-16 MED ORDER — FENTANYL CITRATE (PF) 100 MCG/2ML IJ SOLN
INTRAMUSCULAR | Status: DC | PRN
  Administered 2016-09-16: 50 ug via INTRAVENOUS

## 2016-09-16 MED ORDER — ONDANSETRON HCL 4 MG/2ML IJ SOLN
INTRAMUSCULAR | Status: AC
  Filled 2016-09-16: qty 2

## 2016-09-16 MED ORDER — CEFAZOLIN SODIUM-DEXTROSE 2-4 GM/100ML-% IV SOLN
2.0000 g | Freq: Once | INTRAVENOUS | Status: DC
  Administered 2016-09-16: 2 g via INTRAVENOUS
  Filled 2016-09-16: qty 100

## 2016-09-16 MED ORDER — CEFAZOLIN SODIUM-DEXTROSE 2-4 GM/100ML-% IV SOLN
INTRAVENOUS | Status: AC
  Filled 2016-09-16: qty 100

## 2016-09-16 MED ORDER — MIDAZOLAM HCL 2 MG/2ML IJ SOLN
INTRAMUSCULAR | Status: AC
  Filled 2016-09-16: qty 2

## 2016-09-16 MED ORDER — FENTANYL CITRATE (PF) 100 MCG/2ML IJ SOLN
INTRAMUSCULAR | Status: AC
  Filled 2016-09-16: qty 2

## 2016-09-16 SURGICAL SUPPLY — 7 items
CANNULA 5F STIFF (CANNULA) ×4 IMPLANT
CATH PALINDROME RT-P 15FX19CM (CATHETERS) ×4 IMPLANT
DERMABOND ADVANCED (GAUZE/BANDAGES/DRESSINGS) ×2
DERMABOND ADVANCED .7 DNX12 (GAUZE/BANDAGES/DRESSINGS) ×2 IMPLANT
PACK ANGIOGRAPHY (CUSTOM PROCEDURE TRAY) ×4 IMPLANT
SUT MNCRL AB 4-0 PS2 18 (SUTURE) ×4 IMPLANT
SUT PROLENE 0 CT 1 30 (SUTURE) ×4 IMPLANT

## 2016-09-16 NOTE — Progress Notes (Signed)
Patient ID: Jacqueline Rodriguez, female   DOB: 1960-09-07, 56 y.o.   MRN: [id number]    [facility] PROGRESS NOTE  PALMA BUSTER YFV:[id number] DOB: 1960-04-10 DOA: 09/13/2016 PCP: [facility]  HPI/Subjective: Patient did not sleep well. Awaiting vascular catheter today. Feels okay. Hoping to go home.  Objective: Vitals:   09/16/16 0929 09/16/16 1357  BP: (!) 163/48 (!) 159/42  Pulse: 68 65  Resp: 18 16  Temp: 98 F (36.7 C) 98.1 F (36.7 C)  SpO2: 95% 96%    Filed Weights   09/15/16 0342 09/16/16 0500 09/16/16 0929  Weight: 110.3 kg (243 lb 1.6 oz) 109.8 kg (242 lb 1 oz) 98.9 kg (218 lb)    ROS: Review of Systems  Constitutional: Negative for chills and fever.  Eyes: Negative for blurred vision.  Respiratory: Negative for cough and shortness of breath.   Cardiovascular: Negative for chest pain.  Gastrointestinal: Negative for abdominal pain, constipation, diarrhea, nausea and vomiting.  Genitourinary: Negative for dysuria.  Musculoskeletal: Negative for joint pain.  Neurological: Negative for dizziness and headaches.   Exam: Physical Exam  HENT:  Nose: No mucosal edema.  Mouth/Throat: No oropharyngeal exudate or posterior oropharyngeal edema.  Eyes: Pupils are equal, round, and reactive to light. Conjunctivae, EOM and lids are normal.  Neck: No JVD present. Carotid bruit is not present. No edema present. No thyroid mass and no thyromegaly present.  Cardiovascular: S1 normal and S2 normal.  Exam reveals no gallop.   No murmur heard. Pulses:      Dorsalis pedis pulses are 2+ on the right side, and 2+ on the left side.  Respiratory: No respiratory distress. She has no wheezes. She has no rhonchi. She has no rales.  GI: Soft. Bowel sounds are normal. There is no tenderness.  Musculoskeletal:       Right ankle: She exhibits no swelling.       Left ankle: She exhibits no swelling.  Lymphadenopathy:    She has no cervical adenopathy.  Neurological: She is  alert. No cranial nerve deficit.  Skin: Skin is warm. No rash noted. Nails show no clubbing.  Psychiatric: She has a normal mood and affect.      Data Reviewed: Basic Metabolic Panel:  Recent Labs Lab 09/13/16 1453 09/14/16 0327 09/15/16 0954 09/16/16 0521  NA 130* 131* 130* 135  K 5.7* 5.7* 6.3* 4.6  CL 99* 99* 96* 97*  CO2 17* 18* 20* 24  GLUCOSE 170* 113* 181* 88  BUN 93* 91* 84* 77*  CREATININE 12.44* 12.13* 12.25* 12.03*  CALCIUM 7.2* 8.5* 8.6* 8.2*   CBC:  Recent Labs Lab 09/13/16 1453 09/14/16 0327  WBC 6.8 7.6  NEUTROABS 5.3  --   HGB 8.6* 8.1*  HCT 26.1* 24.2*  MCV 96.0 94.0  PLT 168 162    CBG:  Recent Labs Lab 09/15/16 1156 09/15/16 1653 09/15/16 2112 09/16/16 0731 09/16/16 1144  GLUCAP 185* 110* 237* 125* 93    Recent Results (from the past 240 hour(s))  Urine culture     Status: Abnormal   Collection Time: 09/08/16 12:27 PM  Result Value Ref Range Status   Specimen Description URINE, RANDOM  Final   Special Requests NONE  Final   Culture MULTIPLE SPECIES PRESENT, SUGGEST RECOLLECTION (A)  Final   Report Status 09/10/2016 FINAL  Final  MRSA PCR Screening     Status: None   Collection Time: 09/08/16  6:05 PM  Result Value Ref Range Status  MRSA by PCR NEGATIVE NEGATIVE Final    Comment:        The GeneXpert MRSA Assay (FDA approved for NASAL specimens only), is one component of a comprehensive MRSA colonization surveillance program. It is not intended to diagnose MRSA infection nor to guide or monitor treatment for MRSA infections.       Scheduled Meds: . amLODipine  5 mg Oral Daily  . aspirin EC  81 mg Oral Daily  . azithromycin  250 mg Oral Daily  . calcium acetate  2,001 mg Oral TID WC  . calcium-vitamin D  1 tablet Oral Daily  . docusate sodium  100 mg Oral BID  . ferrous sulfate  325 mg Oral BID  . furosemide  40 mg Oral Daily  . gabapentin  100 mg Oral TID  . gentamicin cream  1 application Topical Daily  .  heparin  5,000 Units Subcutaneous Q8H  . hydrALAZINE  25 mg Oral TID  . insulin aspart  0-9 Units Subcutaneous TID WC  . isosorbide mononitrate  30 mg Oral Daily  . magnesium oxide  400 mg Oral Daily  . patiromer  8.4 g Oral Daily    Assessment/Plan:  1. Hyperkalemia- improved with treatment 2. Clotted vascular graft. The patient will need the graft unclotted versus a permacath placement. Patient will need hemodialysis until peritoneal dialysis teaching able to be done as outpatient. 3. Essential hypertension on amlodipine, imdur and lisinopril 4. Diabetic neuropathy on gabapentin 5. Type 2 diabetes mellitus on sliding scale 6. Chronic diastolic congestive heart failure. This will be harder to manage on peritoneal dialysis. 7. Junctional rhythm on telemetry monitoring previously now in normal sinus rhythm after potassium corrected. 8. 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 This will need to be dosed with dialysis.  Code Status:     Code Status Orders        Start     Ordered   09/13/16 1628  Full code  Continuous     09/13/16 1629    Code Status History    Date Active Date Inactive Code Status Order ID Comments User Context   09/08/2016  4:31 PM 09/09/2016  6:23 PM Full Code [id number]  Fritzi Mandes, MD Inpatient   07/17/2015 12:44 PM 07/24/2015  7:11 PM Full Code [id number]  Epifanio Lesches, MD ED   03/15/2015  7:56 PM 03/20/2015  3:48 PM Full Code [id number]  Idelle Crouch, MD Inpatient     Disposition Plan:  needs vascular catheter placement and antibiotics to be dosed with dialysis to be set up  Consultants:  Nephrology  Vascular surgery  Time spent: 30 minutes  [place], [facility]

## 2016-09-16 NOTE — Progress Notes (Signed)
Nurse from special procedures called to say that Dr. Lucky Cowboy will do perm cath placement later on today and will not be able to do the perm cath insertion at this time.

## 2016-09-16 NOTE — Progress Notes (Signed)
[facility]  ROUNDING NOTE   Subjective:   Patient received peritoneal dialysis. UF of 147.   Permcath for today.   Tobramycin level of 6.1  K 4.6  Back in sinus rhythm  Objective:  Vital signs in last 24 hours:  Temp:  [97.9 F (36.6 C)-98.1 F (36.7 C)] 98 F (36.7 C) (09/06 1508) Pulse Rate:  [59-68] 66 (09/06 1508) Resp:  [11-19] 11 (09/06 1508) BP: (119-207)/(42-82) 187/59 (09/06 1508) SpO2:  [94 %-100 %] 100 % (09/06 1612) Weight:  [98.9 kg (218 lb)-109.8 kg (242 lb 1 oz)] 98.9 kg (218 lb) (09/06 1508)  Weight change: -0.469 kg (-1 lb 0.6 oz) Filed Weights   09/16/16 0500 09/16/16 0929 09/16/16 1508  Weight: 109.8 kg (242 lb 1 oz) 98.9 kg (218 lb) 98.9 kg (218 lb)    Intake/Output: I/O last 3 completed shifts: In: 68 [P.O.:60] Out: 587 [Urine:50; Other:537]   Intake/Output this shift:  Total I/O In: -  Out: 300 [Urine:300]  Physical Exam: General: No acute distress  Head: Normocephalic, atraumatic. Moist oral mucosal membranes  Eyes: Anicteric  Neck: Supple, trachea midline  Lungs:  Clear   Heart: +systolic murmur  Abdomen:  Soft, nontender, bowel sounds present  Extremities: no peripheral edema.  Neurologic: Awake, alert, following commands  Skin: No lesions  Access: +thrill, +bruit left upper extremity AVF peritoneal dialysis catheter in place     Basic Metabolic Panel:  Recent Labs Lab 09/13/16 1453 09/14/16 0327 09/15/16 0954 09/16/16 0521  NA 130* 131* 130* 135  K 5.7* 5.7* 6.3* 4.6  CL 99* 99* 96* 97*  CO2 17* 18* 20* 24  GLUCOSE 170* 113* 181* 88  BUN 93* 91* 84* 77*  CREATININE 12.44* 12.13* 12.25* 12.03*  CALCIUM 7.2* 8.5* 8.6* 8.2*    Liver Function Tests: No results for input(s): AST, ALT, ALKPHOS, BILITOT, PROT, ALBUMIN in the last 168 hours. No results for input(s): LIPASE, AMYLASE in the last 168 hours. No results for input(s): AMMONIA in the last 168 hours.  CBC:  Recent Labs Lab 09/13/16 1453  09/14/16 0327  WBC 6.8 7.6  NEUTROABS 5.3  --   HGB 8.6* 8.1*  HCT 26.1* 24.2*  MCV 96.0 94.0  PLT 168 162    Cardiac Enzymes: No results for input(s): CKTOTAL, CKMB, CKMBINDEX, TROPONINI in the last 168 hours.  BNP: Invalid input(s): POCBNP  CBG:  Recent Labs Lab 09/15/16 1156 09/15/16 1653 09/15/16 2112 09/16/16 0731 09/16/16 1144  GLUCAP 185* 110* 237* 125* 2    Microbiology: Results for orders placed or performed during the hospital encounter of 09/08/16  Urine culture     Status: Abnormal   Collection Time: 09/08/16 12:27 PM  Result Value Ref Range Status   Specimen Description URINE, RANDOM  Final   Special Requests NONE  Final   Culture MULTIPLE SPECIES PRESENT, SUGGEST RECOLLECTION (A)  Final   Report Status 09/10/2016 FINAL  Final  MRSA PCR Screening     Status: None   Collection Time: 09/08/16  6:05 PM  Result Value Ref Range Status   MRSA by PCR NEGATIVE NEGATIVE Final    Comment:        The GeneXpert MRSA Assay (FDA approved for NASAL specimens only), is one component of a comprehensive MRSA colonization surveillance program. It is not intended to diagnose MRSA infection nor to guide or monitor treatment for MRSA infections.     Coagulation Studies: No results for input(s): LABPROT, INR in the last 72 hours.  Urinalysis: No results for input(s): COLORURINE, LABSPEC, PHURINE, GLUCOSEU, HGBUR, BILIRUBINUR, KETONESUR, PROTEINUR, UROBILINOGEN, NITRITE, LEUKOCYTESUR in the last 72 hours.  Invalid input(s): APPERANCEUR    Imaging: No results found.   Medications:   . ceFAZolin     . amLODipine  5 mg Oral Daily  . aspirin EC  81 mg Oral Daily  . azithromycin  250 mg Oral Daily  . calcium acetate  2,001 mg Oral TID WC  . calcium-vitamin D  1 tablet Oral Daily  . docusate sodium  100 mg Oral BID  . ferrous sulfate  325 mg Oral BID  . furosemide  40 mg Oral Daily  . gabapentin  100 mg Oral TID  . gentamicin cream  1 application  Topical Daily  . heparin  5,000 Units Subcutaneous Q8H  . hydrALAZINE  25 mg Oral TID  . insulin aspart  0-9 Units Subcutaneous TID WC  . isosorbide mononitrate  30 mg Oral Daily  . magnesium oxide  400 mg Oral Daily  . patiromer  8.4 g Oral Daily   acetaminophen **OR** acetaminophen, albuterol, bisacodyl, fentaNYL, heparin, HYDROcodone-acetaminophen, loratadine, meclizine, menthol-cetylpyridinium, midazolam, ondansetron **OR** ondansetron (ZOFRAN) IV, traZODone  Assessment/ Plan:  56 y.o. white female with diastolic congestive heart failure, hypertension, diabetes mellitus type II, diabetic retinopathy, diabetic neuropathy, ESRD on HD MWF, anemia of CKD, SHPTH  [facility]/MWF/[place]  1. ESRD on HD MWF: with hyperkalemia. Transitioned to peritoneal dialysis due to nonfunctioning AVF for hemodialysis. Patient tolerated treatment well.  Prescription: CCPD 8 hours 4 cycles with 2.5 liter fills. 1.5% dextrose Plan on hemodialysis tomorrow and then Monday. Start outpatient peritoneal dialysis training on 9/12.   2. Complication with hemodialysis access: consult vascular surgery for tunneled catheter placement.   3. 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  After discussion with Dr. Ola Spurr, ID, and pharmacy:  - Azithromycin 250mg  PO daily.  - Tobramycin IV with HD 210mg  post treatment and then when transitions to PD: 40mg  daily IP for long dwell of 6 hours.  - may need yeast prophylaxis as well.   4. Hyperkalemia: with junctional rhythm. Transitioned to sinus with corrected calcium - Veltassa for potassium binding. - Discontinued lisinopril due to hyperkalemia  5. Hypertension: elevated.  - amlodipine, hydralazine, imdur.  - started furosemide 40mg  PO daily on this admission  6. Anemia of chronic kidney disease.  Hemoglobin 8.1 - EPO to be ordered for HD treatment  7. Secondary hyperparathyroidism with hypocalcemia and  hyperphosphatemia - calcium acetate with meals.   8. Diabetes mellitus type II with chronic kidney disease: Hemoglobin A1c 5.8% on 08/02/16 - sliding scale insulin   LOS: [place], Amil Bouwman 9/6/20184:14 PM

## 2016-09-16 NOTE — H&P (Signed)
 [facility] History & Physical Update  The patient was interviewed and re-examined.  The patient's previous History and Physical has been reviewed and is unchanged.  There is no change in the plan of care. We plan to proceed with the scheduled procedure.  Leotis Pain, MD  09/16/2016, 8:01 AM

## 2016-09-16 NOTE — Progress Notes (Signed)
[facility] Encounter Note  Patient: Jacqueline Rodriguez / Admit Date: 09/13/2016 / Date of Encounter: 09/16/2016, 8:27 AM   Subjective: Patient started on peritoneal dialysis without event. Potassium improved since Kayexalate was given. Lasix started for the possibility of the help with hyperkalemia and edema. Blood pressure improved after dialysis. Bradycardia improved at this time without any further significant symptoms with telemetry showing normal sinus rhythm  Review of Systems: Positive for: Weakness Negative for: Vision change, hearing change, syncope, dizziness, nausea, vomiting,diarrhea, bloody stool, stomach pain, cough, congestion, diaphoresis, urinary frequency, urinary pain,skin lesions, skin rashes Others previously listed  Objective: Telemetry: Normal sinus rhythm Physical Exam: Blood pressure 119/79, pulse 66, temperature 97.9 F (36.6 C), temperature source Oral, resp. rate 18, height 5\' 9"  (1.753 m), weight 109.8 kg (242 lb 1 oz), last menstrual period 09/18/2013, SpO2 97 %. Body mass index is 35.75 kg/m. General: Well developed, well nourished, in no acute distress. Head: Normocephalic, atraumatic, sclera non-icteric, no xanthomas, nares are without discharge. Neck: No apparent masses Lungs: Normal respirations with no wheezes, no rhonchi, no rales , no crackles   Heart: Regular rate and rhythm, normal S1 S2, no murmur, no rub, no gallop, PMI is normal size and placement, carotid upstroke normal without bruit, jugular venous pressure normal Abdomen: Soft, non-tender, non-distended with normoactive bowel sounds. No hepatosplenomegaly. Abdominal aorta is normal size without bruit Extremities: No edema, no clubbing, no cyanosis, no ulcers,  Peripheral: 2+ radial, 2+ femoral, 2+ dorsal pedal pulses Neuro: Alert and oriented. Moves all extremities spontaneously. Psych:  Responds to questions appropriately with a normal affect.   Intake/Output Summary (Last  24 hours) at 09/16/16 0827 Last data filed at 09/16/16 0453  Gross per 24 hour  Intake               60 ml  Output              587 ml  Net             -527 ml    Inpatient Medications:  . amLODipine  5 mg Oral Daily  . aspirin EC  81 mg Oral Daily  . azithromycin  250 mg Oral Daily  . calcium acetate  2,001 mg Oral TID WC  . calcium-vitamin D  1 tablet Oral Daily  . docusate sodium  100 mg Oral BID  . ferrous sulfate  325 mg Oral BID  . furosemide  40 mg Oral Daily  . gabapentin  100 mg Oral TID  . gentamicin cream  1 application Topical Daily  . heparin  5,000 Units Subcutaneous Q8H  . hydrALAZINE  25 mg Oral TID  . insulin aspart  0-9 Units Subcutaneous TID WC  . isosorbide mononitrate  30 mg Oral Daily  . magnesium oxide  400 mg Oral Daily  . patiromer  8.4 g Oral Daily   Infusions:    Labs:  Recent Labs  09/15/16 0954 09/16/16 0521  NA 130* 135  K 6.3* 4.6  CL 96* 97*  CO2 20* 24  GLUCOSE 181* 88  BUN 84* 77*  CREATININE 12.25* 12.03*  CALCIUM 8.6* 8.2*   No results for input(s): AST, ALT, ALKPHOS, BILITOT, PROT, ALBUMIN in the last 72 hours.  Recent Labs  09/13/16 1453 09/14/16 0327  WBC 6.8 7.6  NEUTROABS 5.3  --   HGB 8.6* 8.1*  HCT 26.1* 24.2*  MCV 96.0 94.0  PLT 168 162   No results for input(s): CKTOTAL, CKMB, TROPONINI  in the last 72 hours. Invalid input(s): POCBNP No results for input(s): HGBA1C in the last 72 hours.   Weights: Filed Weights   09/14/16 0500 09/15/16 0342 09/16/16 0500  Weight: 106.4 kg (234 lb 8 oz) 110.3 kg (243 lb 1.6 oz) 109.8 kg (242 lb 1 oz)     Radiology/Studies:  Ct Angio Head W Or Wo Contrast  Result Date: 09/08/2016 CLINICAL DATA:  Persistent vertigo, central. EXAM: CT ANGIOGRAPHY HEAD AND NECK TECHNIQUE: Multidetector CT imaging of the head and neck was performed using the standard protocol during bolus administration of intravenous contrast. Multiplanar CT image reconstructions and MIPs were obtained to  evaluate the vascular anatomy. Carotid stenosis measurements (when applicable) are obtained utilizing NASCET criteria, using the distal internal carotid diameter as the denominator. CONTRAST:  75 cc Isovue 370 intravenous COMPARISON:  None. FINDINGS: CT head: Brain: No evidence of infarct, hemorrhage, hydrocephalus, or masslike finding. Vascular: See below. Skull: Negative. Sinuses and orbits: Negative. CTA NECK FINDINGS Aortic arch: Atherosclerotic plaque on the aorta. Three vessel branching. No acute finding. Right carotid system: Mild partially calcified plaque at the common carotid bifurcation. No stenosis, ulceration, or dissection. Left carotid system: Prominent plaque at the CCA origin with ~50% luminal stenosis. Moderate peripherally calcified centrally low-density plaque at the common carotid bifurcation with 25% proximal ICA narrowing. No definite ulceration. There is mild motion artifact at the level of the left CCA bifurcation. Mild proximal ECA narrowing. Vertebral arteries: No proximal subclavian stenosis. Both vertebral arteries are smooth and widely patent to the dura. Skeleton: No acute or aggressive finding. Other neck: No incidental mass or inflammation noted. Upper chest: Chronic scar-like opacity at the left apex with calcification, also seen on chest CT 01/08/2015. Hazy appearance of the upper lungs which could be from atelectasis. Fissural fluid in the upper left major fissure. Chest x-ray performed today. Review of the MIP images confirms the above findings CTA HEAD FINDINGS Anterior circulation: Atherosclerotic plaque on the carotid siphons without stenosis or ulceration. No branch occlusion, beading, or aneurysm. Posterior circulation: Essentially codominant vertebral arteries. Vertebral and basilar arteries are smooth and widely patent Venous sinuses: Patent Anatomic variants: None significant Delayed phase: No abnormal intracranial enhancement. Review of the MIP images confirms the above  findings IMPRESSION: 1. No acute finding.  No evidence of vertebrobasilar insufficiency. 2. Atherosclerosis, most notable in the left carotid circulation where there is 50% common carotid origins stenosis and 25% proximal ICA stenosis. 3. Electronically Signed   By: Monte Fantasia M.D.   On: 09/08/2016 14:27   Dg Chest 2 View  Result Date: 09/08/2016 CLINICAL DATA:  Hypoglycemia earlier today. History of dialysis dependent renal failure, CHF, COPD, former smoker. EXAM: CHEST  2 VIEW COMPARISON:  Chest x-ray of July 17, 2015 FINDINGS: The lungs are well-expanded. The interstitial markings are mildly increased. The pulmonary vascularity is mildly engorged. The cardiac silhouette is enlarged. The left hemidiaphragm is obscured. There is calcification in the wall of the aortic arch. There is some fluid in the major fissure presumably on the left. No significant costophrenic angle angle blunting is observed. IMPRESSION: Mild CHF.  No alveolar pneumonia nor large pleural effusion. Thoracic aortic atherosclerosis. Electronically Signed   By: David  Martinique M.D.   On: 09/08/2016 13:40   Ct Angio Neck W And/or Wo Contrast  Result Date: 09/08/2016 CLINICAL DATA:  Persistent vertigo, central. EXAM: CT ANGIOGRAPHY HEAD AND NECK TECHNIQUE: Multidetector CT imaging of the head and neck was performed using the standard protocol during  bolus administration of intravenous contrast. Multiplanar CT image reconstructions and MIPs were obtained to evaluate the vascular anatomy. Carotid stenosis measurements (when applicable) are obtained utilizing NASCET criteria, using the distal internal carotid diameter as the denominator. CONTRAST:  75 cc Isovue 370 intravenous COMPARISON:  None. FINDINGS: CT head: Brain: No evidence of infarct, hemorrhage, hydrocephalus, or masslike finding. Vascular: See below. Skull: Negative. Sinuses and orbits: Negative. CTA NECK FINDINGS Aortic arch: Atherosclerotic plaque on the aorta. Three vessel  branching. No acute finding. Right carotid system: Mild partially calcified plaque at the common carotid bifurcation. No stenosis, ulceration, or dissection. Left carotid system: Prominent plaque at the CCA origin with ~50% luminal stenosis. Moderate peripherally calcified centrally low-density plaque at the common carotid bifurcation with 25% proximal ICA narrowing. No definite ulceration. There is mild motion artifact at the level of the left CCA bifurcation. Mild proximal ECA narrowing. Vertebral arteries: No proximal subclavian stenosis. Both vertebral arteries are smooth and widely patent to the dura. Skeleton: No acute or aggressive finding. Other neck: No incidental mass or inflammation noted. Upper chest: Chronic scar-like opacity at the left apex with calcification, also seen on chest CT 01/08/2015. Hazy appearance of the upper lungs which could be from atelectasis. Fissural fluid in the upper left major fissure. Chest x-ray performed today. Review of the MIP images confirms the above findings CTA HEAD FINDINGS Anterior circulation: Atherosclerotic plaque on the carotid siphons without stenosis or ulceration. No branch occlusion, beading, or aneurysm. Posterior circulation: Essentially codominant vertebral arteries. Vertebral and basilar arteries are smooth and widely patent Venous sinuses: Patent Anatomic variants: None significant Delayed phase: No abnormal intracranial enhancement. Review of the MIP images confirms the above findings IMPRESSION: 1. No acute finding.  No evidence of vertebrobasilar insufficiency. 2. Atherosclerosis, most notable in the left carotid circulation where there is 50% common carotid origins stenosis and 25% proximal ICA stenosis. 3. Electronically Signed   By: Monte Fantasia M.D.   On: 09/08/2016 14:27   Mr Brain Wo Contrast  Result Date: 09/08/2016 CLINICAL DATA:  Numbness or tingling. Paresthesias. Central vertigo. Hypoglycemia. EXAM: MRI HEAD WITHOUT CONTRAST TECHNIQUE:  Multiplanar, multiecho pulse sequences of the brain and surrounding structures were obtained without intravenous contrast. COMPARISON:  CTA head and neck from the same day. FINDINGS: Brain: No acute infarct, hemorrhage, or mass lesion is present. The ventricles are of normal size. Study is mildly degraded by patient motion. Periventricular T2 changes are mildly advanced for age. White matter changes extend into the brainstem. The cerebellum is unremarkable. The internal auditory canals are within normal limits bilaterally. Vascular: Flow is present in the major intracranial arteries. Skull and upper cervical spine: The skullbase is within normal limits. The craniocervical junction is unremarkable. Midline sagittal structures are within normal limits. Sinuses/Orbits: The paranasal sinuses and mastoid air cells are clear. The globes and orbits are within normal limits. IMPRESSION: 1. Periventricular white matter changes are mildly advanced for age. White matter changes extend into the brainstem. This likely reflects the sequela of chronic microvascular ischemia. The finding is nonspecific. 2. No other acute or focal lesion to explain the patient's numbness or central vertigo. Electronically Signed   By: San Morelle M.D.   On: 09/08/2016 16:25     Assessment and Recommendation  56 y.o. female with apparent previous diastolic dysfunction congestive heart failure anemia chronic kidney disease stage V peripheral vascular disease diabetes with complication with junctional rhythm and symptomatic bradycardia now improved to normal sinus rhythm without need for further intervention  1. No further cardiac intervention at this time due to no significant symptoms from junctional rhythm and/or bradycardiaNow resolving 2. Watch closely with telemetry for reemergence of bradycardia possibly secondary to hyperkalemia 3. Supportive care for chronic kidney disease stage V and peritoneal dialysis without restrictions 4.  Hypertension control with hydralazine and amlodipine without need an change her to the 5. Avoid ACE inhibitor due to hyperkalemia until dialysis more consistent 6. Avoid beta blockers due to bradycardia currently asymptomatic 7. No further cardiac diagnostics necessary at this time  Signed, Serafina Royals M.D. FACC

## 2016-09-16 NOTE — Op Note (Signed)
OPERATIVE NOTE    PRE-OPERATIVE DIAGNOSIS: 1. ESRD 2. Infiltration of AVF  POST-OPERATIVE DIAGNOSIS: same as above  PROCEDURE: 1. Ultrasound guidance for vascular access to the right internal jugular vein 2. Fluoroscopic guidance for placement of catheter 3. Placement of a 19 cm tip to cuff tunneled hemodialysis catheter via the right internal jugular vein  SURGEON: Leotis Pain, MD  ANESTHESIA:  Local with Moderate conscious sedation for approximately 15 minutes using 1 mg of Versed and 50 mcg of Fentanyl  ESTIMATED BLOOD LOSS: 15 cc  FLUORO TIME: less than one minute  CONTRAST: none  FINDING(S): 1.  Patent right internal jugular vein  SPECIMEN(S):  None  INDICATIONS:   Jacqueline Rodriguez is a 56 y.o.female who presents with infiltration of the AVF and needs a Permcath back for HD.  The patient needs long term dialysis access for their ESRD, and a Permcath is necessary.  Risks and benefits are discussed and informed consent is obtained.    DESCRIPTION: After obtaining full informed written consent, the patient was brought back to the vascular suited. The patient's right neck and chest were sterilely prepped and draped in a sterile surgical field was created. Moderate conscious sedation was administered during a face to face encounter with the patient throughout the procedure with my supervision of the RN administering medicines and monitoring the patient's vital signs, pulse oximetry, telemetry and mental status throughout from the start of the procedure until the patient was taken to the recovery room.  The right internal jugular vein was visualized with ultrasound and found to be patent. It was then accessed under direct ultrasound guidance and a permanent image was recorded. A wire was placed. After skin nick and dilatation, the peel-away sheath was placed over the wire. I then turned my attention to an area under the clavicle. Approximately 1-2 fingerbreadths below the clavicle a small  counterincision was created and tunneled from the subclavicular incision to the access site. Using fluoroscopic guidance, a 19 centimeter tip to cuff tunneled hemodialysis catheter was selected, and tunneled from the subclavicular incision to the access site. It was then placed through the peel-away sheath and the peel-away sheath was removed. Using fluoroscopic guidance the catheter tips were parked in the right atrium. The appropriate distal connectors were placed. It withdrew blood well and flushed easily with heparinized saline and a concentrated heparin solution was then placed. It was secured to the chest wall with 2 Prolene sutures. The access incision was closed single 4-0 Monocryl. A 4-0 Monocryl pursestring suture was placed around the exit site. Sterile dressings were placed. The patient tolerated the procedure well and was taken to the recovery room in stable condition.  COMPLICATIONS: None  CONDITION: Stable  Leotis Pain, MD 09/16/2016 4:45 PM   This note was created with Dragon Medical transcription system. Any errors in dictation are purely unintentional.

## 2016-09-16 NOTE — Care Management Important Message (Signed)
Important Message  Patient Details  Name: Jacqueline Rodriguez MRN: [id number] Date of Birth: [birth year]   Medicare Important Message Given:  Yes    Beverly Sessions, RN 09/16/2016, 2:45 PM

## 2016-09-16 NOTE — Care Management (Signed)
Per Jacqu[facility] patient to return to [facility] for outpatient HD when discharged.

## 2016-09-16 NOTE — OR Nursing (Signed)
Report called to RN for 218 by Mitchell Heir, RN in [facility]. Medications and orders were reviewed. The site is without bleeding or hematoma. Pt vital signs are stable and on RA.

## 2016-09-16 NOTE — H&P (Signed)
[facility] History & Physical Update  The patient was interviewed and re-examined.  The patient's previous History and Physical has been reviewed and is unchanged.  There is no change in the plan of care. We plan to proceed with the scheduled procedure.  Leotis Pain, MD  09/16/2016, 1:41 PM

## 2016-09-17 ENCOUNTER — Encounter: Payer: Self-pay | Admitting: Vascular Surgery

## 2016-09-17 LAB — RENAL FUNCTION PANEL
ANION GAP: 19 — AB (ref 5–15)
Albumin: 3.3 g/dL — ABNORMAL LOW (ref 3.5–5.0)
BUN: 80 mg/dL — ABNORMAL HIGH (ref 6–20)
CALCIUM: 7.1 mg/dL — AB (ref 8.9–10.3)
CHLORIDE: 92 mmol/L — AB (ref 101–111)
CO2: 21 mmol/L — AB (ref 22–32)
CREATININE: 12.08 mg/dL — AB (ref 0.44–1.00)
GFR, EST AFRICAN AMERICAN: 3 mL/min — AB (ref >60)
GFR, EST NON AFRICAN AMERICAN: 3 mL/min — AB (ref >60)
Glucose, Bld: 161 mg/dL — ABNORMAL HIGH (ref 65–99)
Phosphorus: 10.8 mg/dL — ABNORMAL HIGH (ref 2.5–4.6)
Potassium: 4.5 mmol/L (ref 3.5–5.1)
Sodium: 132 mmol/L — ABNORMAL LOW (ref 135–145)

## 2016-09-17 LAB — CBC
HEMATOCRIT: 25.1 % — AB (ref 35.0–47.0)
Hemoglobin: 8.4 g/dL — ABNORMAL LOW (ref 12.0–16.0)
MCH: 32 pg (ref 26.0–34.0)
MCHC: 33.7 g/dL (ref 32.0–36.0)
MCV: 95.2 fL (ref 80.0–100.0)
PLATELETS: 161 10*3/uL (ref 150–440)
RBC: 2.63 MIL/uL — AB (ref 3.80–5.20)
RDW: 18 % — AB (ref 11.5–14.5)
WBC: 6.8 10*3/uL (ref 3.6–11.0)

## 2016-09-17 LAB — GLUCOSE, CAPILLARY: GLUCOSE-CAPILLARY: 168 mg/dL — AB (ref 65–99)

## 2016-09-17 MED ORDER — TOBRAMYCIN SULFATE 80 MG/2ML IJ SOLN: 160.0000 mg | INTRAVENOUS | Status: DC

## 2016-09-17 MED ORDER — MAGNESIUM OXIDE 400 (241.3 MG) MG PO TABS: 400.0000 mg | ORAL_TABLET | Freq: Every day | ORAL | 0 refills | Status: DC

## 2016-09-17 MED ORDER — AZITHROMYCIN 250 MG PO TABS: ORAL_TABLET | ORAL | 0 refills | Status: DC

## 2016-09-17 MED ORDER — PROMETHAZINE HCL 25 MG/ML IJ SOLN: 6.2500 mg | Freq: Once | INTRAMUSCULAR | Status: DC

## 2016-09-17 MED ORDER — TOBRAMYCIN SULFATE 80 MG/2ML IJ SOLN
160.0000 mg | INTRAVENOUS | Status: DC
  Administered 2016-09-17: 160 mg via INTRAVENOUS
  Filled 2016-09-17: qty 4

## 2016-09-17 MED ORDER — NYSTATIN 500000 UNITS PO TABS
500000.0000 [IU] | ORAL_TABLET | Freq: Three times a day (TID) | ORAL | Status: DC
  Administered 2016-09-17: 500000 [IU] via ORAL
  Filled 2016-09-17 (×2): qty 1

## 2016-09-17 MED ORDER — DIPHENHYDRAMINE HCL 50 MG/ML IJ SOLN
12.5000 mg | Freq: Four times a day (QID) | INTRAMUSCULAR | Status: DC | PRN
  Administered 2016-09-17: 12.5 mg via INTRAMUSCULAR

## 2016-09-17 MED ORDER — DIPHENHYDRAMINE HCL 25 MG PO CAPS
25.0000 mg | ORAL_CAPSULE | ORAL | Status: DC | PRN
Start: 2016-09-17 — End: 2016-09-17

## 2016-09-17 MED ORDER — FERROUS SULFATE 325 (65 FE) MG PO TBEC: 325.0000 mg | DELAYED_RELEASE_TABLET | Freq: Two times a day (BID) | ORAL | 0 refills | Status: DC

## 2016-09-17 MED ORDER — EPOETIN ALFA 10000 UNIT/ML IJ SOLN
10000.0000 [IU] | INTRAMUSCULAR | Status: DC
  Administered 2016-09-17: 10000 [IU] via INTRAVENOUS

## 2016-09-17 MED ORDER — ONDANSETRON HCL 4 MG PO TABS: 4.0000 mg | ORAL_TABLET | Freq: Four times a day (QID) | ORAL | 0 refills | Status: DC | PRN

## 2016-09-17 MED ORDER — TOBRAMYCIN SULFATE 80 MG/2ML IJ SOLN: 160.0000 mg | INTRAVENOUS | Status: DC | PRN

## 2016-09-17 MED ORDER — FUROSEMIDE 40 MG PO TABS: 40.0000 mg | ORAL_TABLET | Freq: Every day | ORAL | 0 refills | Status: DC

## 2016-09-17 MED ORDER — CALCIUM ACETATE (PHOS BINDER) 667 MG PO CAPS: 2001.0000 mg | ORAL_CAPSULE | Freq: Three times a day (TID) | ORAL | 0 refills | Status: DC

## 2016-09-17 NOTE — Progress Notes (Signed)
Post hd assessment 

## 2016-09-17 NOTE — Progress Notes (Signed)
Patient taken off the floor for dialysis. Patient is alert and oriented no acute distress noted.

## 2016-09-17 NOTE — Progress Notes (Signed)
Patient discharged to home as ordered, discharge instructions and follow up appointments given as ordered. Patient dressing changed prior to patient leaving small amount of soiled blood noted on dressing. Patient is alert and oriented, no acute distress noted.

## 2016-09-17 NOTE — Progress Notes (Signed)
Pre hd info 

## 2016-09-17 NOTE — Progress Notes (Signed)
Post hd vitals 

## 2016-09-17 NOTE — Progress Notes (Signed)
  End of hd 

## 2016-09-17 NOTE — Progress Notes (Signed)
Hd start, pt alert, nauseous, stable.

## 2016-09-17 NOTE — Care Management (Signed)
[name] HD liaison notified of discharge.

## 2016-09-17 NOTE — Discharge Summary (Signed)
[facility] [place] NAME: Jacqueline Rodriguez    MR#:  [id number]  DATE OF BIRTH:  Mar 27, 1960  DATE OF ADMISSION:  09/13/2016 ADMITTING PHYSICIAN: Epifanio Lesches, MD  DATE OF DISCHARGE: 09/17/2016  3:12 PM  PRIMARY CARE PHYSICIAN: [facility]    ADMISSION DIAGNOSIS:  Problem with vascular access [Z78.9]  DISCHARGE DIAGNOSIS:  Active Problems:   Hyperkalemia   Hypocalcemia   SECONDARY DIAGNOSIS:   Past Medical History:  Diagnosis Date  . Anemia   . Chronic bronchitis ([place])   . Chronic diastolic CHF (congestive heart failure) ([place])   . CKD (chronic kidney disease), stage III   . COPD (chronic obstructive pulmonary disease) ([place])   . Diabetes mellitus with renal complications ([place])   . Essential hypertension   . GERD (gastroesophageal reflux disease)   . Obesity   . Peripheral vascular disease ([place])   . Pulmonary hypertension ([place])   . Renal insufficiency 09/19/2015   Stage 3 CKD. Beginning dialysis.    HOSPITAL COURSE:   1.  Hyperkalemia. The patient had resistant hyperkalemia. Lisinopril needed to be discontinued. The patient was given Kayexalate and peritoneal dialysis treatments. Potassium finally improved into the normal range. Patient had a PermCath placed in the right chest with hemodialysis prior to disposition.  2. clotted vascular graft. The patient had a PermCath placed yesterday evening. It had some bleeding from that site. The patient will have peritoneal dialysis teaching done at the dialysis center. She will be on hemodialysis in the meantime. 3. Essential hypertension on amlodipine, Imdur 4. Diabetic neuropathy on gabapentin 5. Type 2 diabetes mellitus diet control 6. Chronic diastolic congestive heart failure. Hemodialysis to manage fluid 7. Junctional rhythm on telemetry monitoring. This had corrected to normal sinus rhythm once potassium improved. Seen by [facility] clinic cardiology. 8. Chronic PD  catheter infection. Dr. Ola Spurr recommended IV tobramycin while on hemodialysis and peritoneal tobramycin when switched over to peritoneal dialysis. This needs to be continued for 2 months. Oral Zithromax will need to be continued for 2 months I wrote a prescription for one month. These follow-up for refills. 9. Chronic anemia I prescribed iron. Procrit with dialysis  DISCHARGE CONDITIONS:   satisfactory  CONSULTS OBTAINED:  Treatment Team:  Lavonia Dana, MD Schnier, Dolores Lory, MD Leonel Ramsay, MD Corey Skains, MD  DRUG ALLERGIES:   Allergies  Allergen Reactions  . Ambien [Zolpidem] Other (See Comments)    hallucinations    DISCHARGE MEDICATIONS:   Discharge Medication List as of 09/17/2016  2:14 PM    START taking these medications   Details  azithromycin (ZITHROMAX) 250 MG tablet One tablet daily as per DR Ola Spurr infectious disease, Print    calcium acetate (PHOSLO) 667 MG capsule Take 3 capsules (2,001 mg total) by mouth 3 (three) times daily with meals., Starting Fri 09/17/2016, Print    ferrous sulfate 325 (65 FE) MG EC tablet Take 1 tablet (325 mg total) by mouth 2 (two) times daily., Starting Fri 09/17/2016, Until Sat 09/17/2017, Print    furosemide (LASIX) 40 MG tablet Take 1 tablet (40 mg total) by mouth daily., Starting Fri 09/17/2016, Print    magnesium oxide (MAG-OX) 400 (241.3 Mg) MG tablet Take 1 tablet (400 mg total) by mouth daily., Starting Fri 09/17/2016, Print    ondansetron (ZOFRAN) 4 MG tablet Take 1 tablet (4 mg total) by mouth every 6 (six) hours as needed for nausea., Starting Fri 09/17/2016, Print    tobramycin  160 mg in dextrose 5 % 50 mL Inject 160 mg into the vein every Monday, Wednesday, and Friday at 6 PM., Starting Fri 09/17/2016, No Print      CONTINUE these medications which have NOT CHANGED   Details  acetaminophen (TYLENOL) 500 MG tablet Take 500 mg by mouth every 6 (six) hours as needed., Historical Med    albuterol (PROVENTIL  HFA;VENTOLIN HFA) 108 (90 Base) MCG/ACT inhaler Inhale 2 puffs into the lungs every 6 (six) hours as needed for wheezing or shortness of breath., Starting Wed 01/08/2015, Print    amLODipine (NORVASC) 5 MG tablet Take 1 tablet (5 mg total) by mouth daily., Starting Thu 07/24/2015, Print    aspirin EC 81 MG EC tablet Take 1 tablet (81 mg total) by mouth daily., Starting Thu 03/20/2015, Print    Calcium-Magnesium-Vitamin D (CALCIUM 1200+D3 PO) Take 1 tablet by mouth daily., Historical Med    cetirizine (ZYRTEC) 10 MG tablet Take 10 mg by mouth daily as needed for allergies., Historical Med    gabapentin (NEURONTIN) 100 MG capsule Take 100 mg by mouth 3 (three) times daily as needed. For neuropathy., Starting Wed 03/05/2015, Historical Med    gentamicin cream (GARAMYCIN) 0.1 % Apply 1 application topically daily., Starting Fri 06/25/2016, Historical Med    GLUCOSAMINE-CHONDROITIN PO Take 4,000 mg by mouth daily., Historical Med    hydrALAZINE (APRESOLINE) 25 MG tablet Take 25 mg by mouth 3 (three) times daily., Historical Med    HYDROcodone-acetaminophen (NORCO) 5-325 MG tablet Take 1 tablet by mouth every 6 (six) hours as needed for moderate pain., Starting Thu 06/10/2016, Print    isosorbide mononitrate (IMDUR) 30 MG 24 hr tablet Take 30 mg by mouth daily., Historical Med    meclizine (ANTIVERT) 25 MG tablet Take 1 tablet (25 mg total) by mouth 3 (three) times daily as needed for dizziness., Starting Mon 12/08/2015, Print    Magnesium Oxide 250 MG TABS Take 1 tablet by mouth daily., Historical Med      STOP taking these medications     ferrous sulfate 325 (65 FE) MG tablet      lisinopril (PRINIVIL,ZESTRIL) 10 MG tablet          DISCHARGE INSTRUCTIONS:   Follow-up dialysis Monday Wednesday Friday Follow-up PMD one week  If you experience worsening of your admission symptoms, develop shortness of breath, life threatening emergency, suicidal or homicidal thoughts you must seek  medical attention immediately by calling 911 or calling your MD immediately  if symptoms less severe.  You Must read complete instructions/literature along with all the possible adverse reactions/side effects for all the Medicines you take and that have been prescribed to you. Take any new Medicines after you have completely understood and accept all the possible adverse reactions/side effects.   Please note  You were cared for by a hospitalist during your hospital stay. If you have any questions about your discharge medications or the care you received while you were in the hospital after you are discharged, you can call the unit and asked to speak with the hospitalist on call if the hospitalist that took care of you is not available. Once you are discharged, your primary care physician will handle any further medical issues. Please note that NO REFILLS for any discharge medications will be authorized once you are discharged, as it is imperative that you return to your primary care physician (or establish a relationship with a primary care physician if you do not have one) for your  aftercare needs so that they can reassess your need for medications and monitor your lab values.    Today   CHIEF COMPLAINT:   Chief Complaint  Patient presents with  . Vascular Access Problem    HISTORY OF PRESENT ILLNESS:  Jacqueline Rodriguez  is a 56 y.o. female sent in for hyperkalemia and vascular access problem   VITAL SIGNS:  Blood pressure (!) 191/58, pulse 84, temperature 97.8 F (36.6 C), temperature source Oral, resp. rate 16, height 5\' 9"  (1.753 m), weight 107.9 kg (237 lb 14 oz), last menstrual period 09/18/2013, SpO2 95 %.   PHYSICAL EXAMINATION:  GENERAL:  56 y.o.-year-old patient lying in the bed with no acute distress.  EYES: Pupils equal, round, reactive to light and accommodation. No scleral icterus. Extraocular muscles intact.  HEENT: Head atraumatic, normocephalic. Oropharynx and nasopharynx  clear.  NECK:  Supple, no jugular venous distention. No thyroid enlargement, no tenderness.  LUNGS: Normal breath sounds bilaterally, no wheezing, rales,rhonchi or crepitation. No use of accessory muscles of respiration.  CARDIOVASCULAR: S1, S2 normal. No murmurs, rubs, or gallops.  ABDOMEN: Soft, non-tender, non-distended. Bowel sounds present. No organomegaly or mass.  EXTREMITIES: No pedal edema, cyanosis, or clubbing.  NEUROLOGIC: Cranial nerves II through XII are intact. Muscle strength 5/5 in all extremities. Sensation intact. Gait not checked.  PSYCHIATRIC: The patient is alert and oriented x 3.  SKIN: No obvious rash, lesion, or ulcer.   DATA REVIEW:   CBC  Recent Labs Lab 09/17/16 0433  WBC 6.8  HGB 8.4*  HCT 25.1*  PLT 161    Chemistries   Recent Labs Lab 09/17/16 1000  NA 132*  K 4.5  CL 92*  CO2 21*  GLUCOSE 161*  BUN 80*  CREATININE 12.08*  CALCIUM 7.1*    istribution Microbiology Results  Results for orders placed or performed during the hospital encounter of 09/08/16  Urine culture     Status: Abnormal   Collection Time: 09/08/16 12:27 PM  Result Value Ref Range Status   Specimen Description URINE, RANDOM  Final   Special Requests NONE  Final   Culture MULTIPLE SPECIES PRESENT, SUGGEST RECOLLECTION (A)  Final   Report Status 09/10/2016 FINAL  Final  MRSA PCR Screening     Status: None   Collection Time: 09/08/16  6:05 PM  Result Value Ref Range Status   MRSA by PCR NEGATIVE NEGATIVE Final    Comment:        The GeneXpert MRSA Assay (FDA approved for NASAL specimens only), is one component of a comprehensive MRSA colonization surveillance program. It is not intended to diagnose MRSA infection nor to guide or monitor treatment for MRSA infections.       Management plans discussed with the patient, family and they are in agreement.  CODE STATUS:     Code Status Orders        Start     Ordered   09/13/16 1628  Full code   Continuous     09/13/16 1629    Code Status History    Date Active Date Inactive Code Status Order ID Comments User Context   09/08/2016  4:31 PM 09/09/2016  6:23 PM Full Code [id number]  Fritzi Mandes, MD Inpatient   07/17/2015 12:44 PM 07/24/2015  7:11 PM Full Code [id number]  Epifanio Lesches, MD ED   03/15/2015  7:56 PM 03/20/2015  3:48 PM Full Code [id number]  Idelle Crouch, MD Inpatient      TOTAL TIME TAKING CARE  OF THIS PATIENT: 35 minutes.    Loletha Grayer M.D on 09/17/2016 at 4:19 PM  Between 7am to 6pm - Pager - [phone]  After 6pm go to [email or web address] - Proofreader  [employer] Office  [phone]  CC: Primary care physician; [place], [place]

## 2016-09-17 NOTE — Progress Notes (Signed)
New central line dressing on right chest bloody, this AM dressing reinforced and attempting to notify dialysis nurse since patient is to have dialysis this morning. Dialysis coordinator Elmer Picker consulted on protocol to do dressing changes on new central catheter and writer instructed to call dialysis and notify them that dressing needs to be changed if patient does not go this morning then writer will do sterile dressing change.

## 2016-09-17 NOTE — Progress Notes (Signed)
Pharmacy Antibiotic Note  Jacqueline Rodriguez is a 56 y.o. female admitted on 09/13/2016 with abscess at dialysis site.  Pharmacy has been consulted for tobramycin iv dosing.  Plan: 9/5 patient received 1 dose of Tobramycin 210mg  IV. Dosing weight is adjusted body weight of 82kg. Re-dose 2-3mg /kg would be indicated in pre-HD level < 3-5mg /L or post-HD concentration 2mg /L.   9/6 Tobramycin random level 6.1.    9/7 Patient to receive 1st HD treatment today. Will order Tobramycin 160mg  (2mg /kg) IV to be given after HD. Plan is for patient to receive HD on Monday, then begin PD on Wednesday. Tobramycin dosing for IP and plan for trough level checks have been discussed with Dr. Juleen China. Duration of treatment is expected to be 2 months.    Height: 5\' 9"  (175.3 cm) Weight: 236 lb 12.4 oz (107.4 kg) IBW/kg (Calculated) : 66.2  Temp (24hrs), Avg:97.9 F (36.6 C), Min:97.7 F (36.5 C), Max:98.2 F (36.8 C)   Recent Labs Lab 09/13/16 1453 09/14/16 0327 09/15/16 0954 09/16/16 0521 09/16/16 1220 09/17/16 0433  WBC 6.8 7.6  --   --   --  6.8  CREATININE 12.44* 12.13* 12.25* 12.03*  --   --   TOBRARND  --   --   --   --  6.1  --     Estimated Creatinine Clearance: 6.7 mL/min (A) (by C-G formula based on SCr of 12.03 mg/dL (H)).    Allergies  Allergen Reactions  . Ambien [Zolpidem] Other (See Comments)    hallucinations    Antimicrobials this admission: 9/4 tobramycin >>   Thank you for allowing pharmacy to be a part of this patient's care.  Pernell Dupre, PharmD, BCPS Clinical Pharmacist 09/17/2016 9:00 AM

## 2016-09-17 NOTE — Progress Notes (Signed)
Pre hd assessment  

## 2016-09-17 NOTE — Progress Notes (Signed)
[facility]  ROUNDING NOTE   Subjective:   Seen and examined on hemodialysis. Some bleeding from exit site of permcath.   Nausea and vomiting reported.    Objective:  Vital signs in last 24 hours:  Temp:  [97.7 F (36.5 C)-98.2 F (36.8 C)] 97.8 F (36.6 C) (09/07 0950) Pulse Rate:  [52-68] 63 (09/07 0950) Resp:  [10-20] 16 (09/07 0950) BP: (140-187)/(40-59) 176/57 (09/07 0950) SpO2:  [93 %-100 %] 99 % (09/07 0950) Weight:  [98.9 kg (218 lb)-107.9 kg (237 lb 14 oz)] 107.9 kg (237 lb 14 oz) (09/07 0950)  Weight change: -10.916 kg (-24 lb 1 oz) Filed Weights   09/16/16 1508 09/17/16 0500 09/17/16 0950  Weight: 98.9 kg (218 lb) 107.4 kg (236 lb 12.4 oz) 107.9 kg (237 lb 14 oz)    Intake/Output: I/O last 3 completed shifts: In: 180 [P.O.:180] Out: 300 [Urine:300]   Intake/Output this shift:  Total I/O In: 240 [P.O.:240] Out: -   Physical Exam: General: No acute distress  Head: Normocephalic, atraumatic. Moist oral mucosal membranes  Eyes: Anicteric  Neck: Supple, trachea midline  Lungs:  Clear   Heart: +systolic murmur  Abdomen:  Soft, nontender, bowel sounds present  Extremities: no peripheral edema.  Neurologic: Awake, alert, following commands  Skin: No lesions  Access: +thrill, +bruit left upper extremity AVF peritoneal dialysis catheter in place  RIJ permcath Dr. Lucky Cowboy 9/6.     Basic Metabolic Panel:  Recent Labs Lab 09/13/16 1453 09/14/16 0327 09/15/16 0954 09/16/16 0521  NA 130* 131* 130* 135  K 5.7* 5.7* 6.3* 4.6  CL 99* 99* 96* 97*  CO2 17* 18* 20* 24  GLUCOSE 170* 113* 181* 88  BUN 93* 91* 84* 77*  CREATININE 12.44* 12.13* 12.25* 12.03*  CALCIUM 7.2* 8.5* 8.6* 8.2*    Liver Function Tests: No results for input(s): AST, ALT, ALKPHOS, BILITOT, PROT, ALBUMIN in the last 168 hours. No results for input(s): LIPASE, AMYLASE in the last 168 hours. No results for input(s): AMMONIA in the last 168 hours.  CBC:  Recent Labs Lab  09/13/16 1453 09/14/16 0327 09/17/16 0433  WBC 6.8 7.6 6.8  NEUTROABS 5.3  --   --   HGB 8.6* 8.1* 8.4*  HCT 26.1* 24.2* 25.1*  MCV 96.0 94.0 95.2  PLT 168 162 161    Cardiac Enzymes: No results for input(s): CKTOTAL, CKMB, CKMBINDEX, TROPONINI in the last 168 hours.  BNP: Invalid input(s): POCBNP  CBG:  Recent Labs Lab 09/16/16 1144 09/16/16 1520 09/16/16 1746 09/16/16 2144 09/17/16 0742  GLUCAP 93 93 123* 162* 168*    Microbiology: Results for orders placed or performed during the hospital encounter of 09/08/16  Urine culture     Status: Abnormal   Collection Time: 09/08/16 12:27 PM  Result Value Ref Range Status   Specimen Description URINE, RANDOM  Final   Special Requests NONE  Final   Culture MULTIPLE SPECIES PRESENT, SUGGEST RECOLLECTION (A)  Final   Report Status 09/10/2016 FINAL  Final  MRSA PCR Screening     Status: None   Collection Time: 09/08/16  6:05 PM  Result Value Ref Range Status   MRSA by PCR NEGATIVE NEGATIVE Final    Comment:        The GeneXpert MRSA Assay (FDA approved for NASAL specimens only), is one component of a comprehensive MRSA colonization surveillance program. It is not intended to diagnose MRSA infection nor to guide or monitor treatment for MRSA infections.  Coagulation Studies: No results for input(s): LABPROT, INR in the last 72 hours.  Urinalysis: No results for input(s): COLORURINE, LABSPEC, PHURINE, GLUCOSEU, HGBUR, BILIRUBINUR, KETONESUR, PROTEINUR, UROBILINOGEN, NITRITE, LEUKOCYTESUR in the last 72 hours.  Invalid input(s): APPERANCEUR    Imaging: No results found.   Medications:    . amLODipine  5 mg Oral Daily  . aspirin EC  81 mg Oral Daily  . azithromycin  250 mg Oral Daily  . calcium acetate  2,001 mg Oral TID WC  . calcium-vitamin D  1 tablet Oral Daily  . docusate sodium  100 mg Oral BID  . ferrous sulfate  325 mg Oral BID  . furosemide  40 mg Oral Daily  . gabapentin  100 mg Oral TID   . gentamicin cream  1 application Topical Daily  . heparin  5,000 Units Subcutaneous Q8H  . hydrALAZINE  25 mg Oral TID  . insulin aspart  0-9 Units Subcutaneous TID WC  . isosorbide mononitrate  30 mg Oral Daily  . magnesium oxide  400 mg Oral Daily  . patiromer  8.4 g Oral Daily  . promethazine  6.25 mg Intravenous Once   acetaminophen **OR** acetaminophen, albuterol, bisacodyl, heparin, HYDROcodone-acetaminophen, loratadine, meclizine, menthol-cetylpyridinium, ondansetron **OR** ondansetron (ZOFRAN) IV, traZODone  Assessment/ Plan:  56 y.o. white female with diastolic congestive heart failure, hypertension, diabetes mellitus type II, diabetic retinopathy, diabetic neuropathy, ESRD on HD MWF, anemia of CKD, SHPTH  [facility]/MWF/[place]  1. ESRD on HD MWF: with hyperkalemia. Transitioned to peritoneal dialysis due to nonfunctioning AVF for hemodialysis. Patient tolerated treatment well.  Prescription: CCPD 8 hours 4 cycles with 2.5 liter fills. 1.5% dextrose Plan on hemodialysis today and then Monday. Start outpatient peritoneal dialysis training on 9/12.   2. Complication with hemodialysis access: consult vascular surgery for tunneled catheter placement.   3. 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  After discussion with Dr. Ola Spurr, ID, and pharmacy:  - Azithromycin 250mg  PO daily.  - Tobramycin IV with HD 210mg  post treatment and then when transitions to PD: 40mg  daily IP for long dwell of 6 hours.  - Yeast prophylaxis: nystatin.   4. Hyperkalemia: with junctional rhythm. Transitioned to sinus with IV calcium - Veltassa for potassium binding. - Discontinued lisinopril due to hyperkalemia  5. Hypertension: 170/56.  - amlodipine, hydralazine, imdur.  - started furosemide 40mg  PO daily on this admission  6. Anemia of chronic kidney disease.  Hemoglobin 8.4 - EPO with HD treatment  7. Secondary  hyperparathyroidism with hypocalcemia and hyperphosphatemia - calcium acetate with meals.   8. Diabetes mellitus type II with chronic kidney disease: Hemoglobin A1c 5.8% on 08/02/16 - sliding scale insulin   LOS: [place], Elbert Polyakov 9/7/201810:25 AM

## 2016-09-18 LAB — HEPATITIS B SURFACE ANTIGEN: Hepatitis B Surface Ag: NEGATIVE

## 2016-09-18 LAB — HEPATITIS B SURFACE ANTIBODY,QUALITATIVE: Hep B S Ab: NONREACTIVE

## 2016-09-20 DIAGNOSIS — D509 Iron deficiency anemia, unspecified: Secondary | ICD-10-CM | POA: Diagnosis not present

## 2016-09-20 DIAGNOSIS — T85898A Other specified complication of other internal prosthetic devices, implants and grafts, initial encounter: Secondary | ICD-10-CM | POA: Diagnosis not present

## 2016-09-20 DIAGNOSIS — N2581 Secondary hyperparathyroidism of renal origin: Secondary | ICD-10-CM | POA: Diagnosis not present

## 2016-09-20 DIAGNOSIS — D631 Anemia in chronic kidney disease: Secondary | ICD-10-CM | POA: Diagnosis not present

## 2016-09-20 DIAGNOSIS — Z992 Dependence on renal dialysis: Secondary | ICD-10-CM | POA: Diagnosis not present

## 2016-09-20 DIAGNOSIS — N186 End stage renal disease: Secondary | ICD-10-CM | POA: Diagnosis not present

## 2016-09-22 DIAGNOSIS — D509 Iron deficiency anemia, unspecified: Secondary | ICD-10-CM | POA: Diagnosis not present

## 2016-09-22 DIAGNOSIS — N2581 Secondary hyperparathyroidism of renal origin: Secondary | ICD-10-CM | POA: Diagnosis not present

## 2016-09-22 DIAGNOSIS — Z992 Dependence on renal dialysis: Secondary | ICD-10-CM | POA: Diagnosis not present

## 2016-09-22 DIAGNOSIS — T85898A Other specified complication of other internal prosthetic devices, implants and grafts, initial encounter: Secondary | ICD-10-CM | POA: Diagnosis not present

## 2016-09-22 DIAGNOSIS — N186 End stage renal disease: Secondary | ICD-10-CM | POA: Diagnosis not present

## 2016-09-22 DIAGNOSIS — D631 Anemia in chronic kidney disease: Secondary | ICD-10-CM | POA: Diagnosis not present

## 2016-09-27 DIAGNOSIS — N2581 Secondary hyperparathyroidism of renal origin: Secondary | ICD-10-CM | POA: Diagnosis not present

## 2016-09-27 DIAGNOSIS — D509 Iron deficiency anemia, unspecified: Secondary | ICD-10-CM | POA: Diagnosis not present

## 2016-09-27 DIAGNOSIS — D631 Anemia in chronic kidney disease: Secondary | ICD-10-CM | POA: Diagnosis not present

## 2016-09-27 DIAGNOSIS — T85898A Other specified complication of other internal prosthetic devices, implants and grafts, initial encounter: Secondary | ICD-10-CM | POA: Diagnosis not present

## 2016-09-27 DIAGNOSIS — Z992 Dependence on renal dialysis: Secondary | ICD-10-CM | POA: Diagnosis not present

## 2016-09-27 DIAGNOSIS — N186 End stage renal disease: Secondary | ICD-10-CM | POA: Diagnosis not present

## 2016-09-28 DIAGNOSIS — D509 Iron deficiency anemia, unspecified: Secondary | ICD-10-CM | POA: Diagnosis not present

## 2016-09-28 DIAGNOSIS — N186 End stage renal disease: Secondary | ICD-10-CM | POA: Diagnosis not present

## 2016-09-28 DIAGNOSIS — Z992 Dependence on renal dialysis: Secondary | ICD-10-CM | POA: Diagnosis not present

## 2016-09-28 DIAGNOSIS — N2581 Secondary hyperparathyroidism of renal origin: Secondary | ICD-10-CM | POA: Diagnosis not present

## 2016-09-28 DIAGNOSIS — D631 Anemia in chronic kidney disease: Secondary | ICD-10-CM | POA: Diagnosis not present

## 2016-09-28 DIAGNOSIS — Z298 Encounter for other specified prophylactic measures: Secondary | ICD-10-CM | POA: Diagnosis not present

## 2016-09-29 DIAGNOSIS — D631 Anemia in chronic kidney disease: Secondary | ICD-10-CM | POA: Diagnosis not present

## 2016-09-29 DIAGNOSIS — D509 Iron deficiency anemia, unspecified: Secondary | ICD-10-CM | POA: Diagnosis not present

## 2016-09-29 DIAGNOSIS — Z298 Encounter for other specified prophylactic measures: Secondary | ICD-10-CM | POA: Diagnosis not present

## 2016-09-29 DIAGNOSIS — N186 End stage renal disease: Secondary | ICD-10-CM | POA: Diagnosis not present

## 2016-09-29 DIAGNOSIS — N2581 Secondary hyperparathyroidism of renal origin: Secondary | ICD-10-CM | POA: Diagnosis not present

## 2016-09-29 DIAGNOSIS — Z992 Dependence on renal dialysis: Secondary | ICD-10-CM | POA: Diagnosis not present

## 2016-09-30 DIAGNOSIS — N186 End stage renal disease: Secondary | ICD-10-CM | POA: Diagnosis not present

## 2016-09-30 DIAGNOSIS — D509 Iron deficiency anemia, unspecified: Secondary | ICD-10-CM | POA: Diagnosis not present

## 2016-09-30 DIAGNOSIS — N2581 Secondary hyperparathyroidism of renal origin: Secondary | ICD-10-CM | POA: Diagnosis not present

## 2016-09-30 DIAGNOSIS — Z992 Dependence on renal dialysis: Secondary | ICD-10-CM | POA: Diagnosis not present

## 2016-09-30 DIAGNOSIS — D631 Anemia in chronic kidney disease: Secondary | ICD-10-CM | POA: Diagnosis not present

## 2016-09-30 DIAGNOSIS — Z298 Encounter for other specified prophylactic measures: Secondary | ICD-10-CM | POA: Diagnosis not present

## 2016-10-01 DIAGNOSIS — Z992 Dependence on renal dialysis: Secondary | ICD-10-CM | POA: Diagnosis not present

## 2016-10-01 DIAGNOSIS — D631 Anemia in chronic kidney disease: Secondary | ICD-10-CM | POA: Diagnosis not present

## 2016-10-01 DIAGNOSIS — N186 End stage renal disease: Secondary | ICD-10-CM | POA: Diagnosis not present

## 2016-10-01 DIAGNOSIS — D509 Iron deficiency anemia, unspecified: Secondary | ICD-10-CM | POA: Diagnosis not present

## 2016-10-01 DIAGNOSIS — N2581 Secondary hyperparathyroidism of renal origin: Secondary | ICD-10-CM | POA: Diagnosis not present

## 2016-10-01 DIAGNOSIS — T85898A Other specified complication of other internal prosthetic devices, implants and grafts, initial encounter: Secondary | ICD-10-CM | POA: Diagnosis not present

## 2016-10-04 DIAGNOSIS — D631 Anemia in chronic kidney disease: Secondary | ICD-10-CM | POA: Diagnosis not present

## 2016-10-04 DIAGNOSIS — N186 End stage renal disease: Secondary | ICD-10-CM | POA: Diagnosis not present

## 2016-10-04 DIAGNOSIS — N2581 Secondary hyperparathyroidism of renal origin: Secondary | ICD-10-CM | POA: Diagnosis not present

## 2016-10-04 DIAGNOSIS — Z992 Dependence on renal dialysis: Secondary | ICD-10-CM | POA: Diagnosis not present

## 2016-10-04 DIAGNOSIS — D509 Iron deficiency anemia, unspecified: Secondary | ICD-10-CM | POA: Diagnosis not present

## 2016-10-04 DIAGNOSIS — Z298 Encounter for other specified prophylactic measures: Secondary | ICD-10-CM | POA: Diagnosis not present

## 2016-10-05 ENCOUNTER — Encounter: Payer: Self-pay | Admitting: Infectious Diseases

## 2016-10-05 DIAGNOSIS — N186 End stage renal disease: Secondary | ICD-10-CM | POA: Diagnosis not present

## 2016-10-05 DIAGNOSIS — A319 Mycobacterial infection, unspecified: Secondary | ICD-10-CM | POA: Insufficient documentation

## 2016-10-05 DIAGNOSIS — Z298 Encounter for other specified prophylactic measures: Secondary | ICD-10-CM | POA: Diagnosis not present

## 2016-10-05 DIAGNOSIS — A318 Other mycobacterial infections: Secondary | ICD-10-CM | POA: Insufficient documentation

## 2016-10-05 DIAGNOSIS — D631 Anemia in chronic kidney disease: Secondary | ICD-10-CM | POA: Diagnosis not present

## 2016-10-05 DIAGNOSIS — N2581 Secondary hyperparathyroidism of renal origin: Secondary | ICD-10-CM | POA: Diagnosis not present

## 2016-10-05 DIAGNOSIS — T8571XA Infection and inflammatory reaction due to peritoneal dialysis catheter, initial encounter: Secondary | ICD-10-CM | POA: Insufficient documentation

## 2016-10-05 DIAGNOSIS — D509 Iron deficiency anemia, unspecified: Secondary | ICD-10-CM | POA: Diagnosis not present

## 2016-10-05 DIAGNOSIS — Z992 Dependence on renal dialysis: Secondary | ICD-10-CM | POA: Diagnosis not present

## 2016-10-06 DIAGNOSIS — N2581 Secondary hyperparathyroidism of renal origin: Secondary | ICD-10-CM | POA: Diagnosis not present

## 2016-10-06 DIAGNOSIS — D509 Iron deficiency anemia, unspecified: Secondary | ICD-10-CM | POA: Diagnosis not present

## 2016-10-06 DIAGNOSIS — N186 End stage renal disease: Secondary | ICD-10-CM | POA: Diagnosis not present

## 2016-10-06 DIAGNOSIS — D631 Anemia in chronic kidney disease: Secondary | ICD-10-CM | POA: Diagnosis not present

## 2016-10-06 DIAGNOSIS — Z298 Encounter for other specified prophylactic measures: Secondary | ICD-10-CM | POA: Diagnosis not present

## 2016-10-06 DIAGNOSIS — Z992 Dependence on renal dialysis: Secondary | ICD-10-CM | POA: Diagnosis not present

## 2016-10-07 DIAGNOSIS — N2581 Secondary hyperparathyroidism of renal origin: Secondary | ICD-10-CM | POA: Diagnosis not present

## 2016-10-07 DIAGNOSIS — D631 Anemia in chronic kidney disease: Secondary | ICD-10-CM | POA: Diagnosis not present

## 2016-10-07 DIAGNOSIS — Z992 Dependence on renal dialysis: Secondary | ICD-10-CM | POA: Diagnosis not present

## 2016-10-07 DIAGNOSIS — N186 End stage renal disease: Secondary | ICD-10-CM | POA: Diagnosis not present

## 2016-10-07 DIAGNOSIS — T85898A Other specified complication of other internal prosthetic devices, implants and grafts, initial encounter: Secondary | ICD-10-CM | POA: Diagnosis not present

## 2016-10-07 DIAGNOSIS — D509 Iron deficiency anemia, unspecified: Secondary | ICD-10-CM | POA: Diagnosis not present

## 2016-10-10 DIAGNOSIS — Z992 Dependence on renal dialysis: Secondary | ICD-10-CM | POA: Diagnosis not present

## 2016-10-10 DIAGNOSIS — N186 End stage renal disease: Secondary | ICD-10-CM | POA: Diagnosis not present

## 2016-10-12 DIAGNOSIS — D509 Iron deficiency anemia, unspecified: Secondary | ICD-10-CM | POA: Diagnosis not present

## 2016-10-12 DIAGNOSIS — D631 Anemia in chronic kidney disease: Secondary | ICD-10-CM | POA: Diagnosis not present

## 2016-10-12 DIAGNOSIS — Z23 Encounter for immunization: Secondary | ICD-10-CM | POA: Diagnosis not present

## 2016-10-12 DIAGNOSIS — N2581 Secondary hyperparathyroidism of renal origin: Secondary | ICD-10-CM | POA: Diagnosis not present

## 2016-10-12 DIAGNOSIS — T85898A Other specified complication of other internal prosthetic devices, implants and grafts, initial encounter: Secondary | ICD-10-CM | POA: Diagnosis not present

## 2016-10-12 DIAGNOSIS — N186 End stage renal disease: Secondary | ICD-10-CM | POA: Diagnosis not present

## 2016-10-12 DIAGNOSIS — Z992 Dependence on renal dialysis: Secondary | ICD-10-CM | POA: Diagnosis not present

## 2016-10-16 DIAGNOSIS — Z23 Encounter for immunization: Secondary | ICD-10-CM | POA: Diagnosis not present

## 2016-10-16 DIAGNOSIS — D509 Iron deficiency anemia, unspecified: Secondary | ICD-10-CM | POA: Diagnosis not present

## 2016-10-16 DIAGNOSIS — D631 Anemia in chronic kidney disease: Secondary | ICD-10-CM | POA: Diagnosis not present

## 2016-10-16 DIAGNOSIS — T85898A Other specified complication of other internal prosthetic devices, implants and grafts, initial encounter: Secondary | ICD-10-CM | POA: Diagnosis not present

## 2016-10-16 DIAGNOSIS — N186 End stage renal disease: Secondary | ICD-10-CM | POA: Diagnosis not present

## 2016-10-16 DIAGNOSIS — Z992 Dependence on renal dialysis: Secondary | ICD-10-CM | POA: Diagnosis not present

## 2016-10-19 DIAGNOSIS — D509 Iron deficiency anemia, unspecified: Secondary | ICD-10-CM | POA: Diagnosis not present

## 2016-10-19 DIAGNOSIS — T85898A Other specified complication of other internal prosthetic devices, implants and grafts, initial encounter: Secondary | ICD-10-CM | POA: Diagnosis not present

## 2016-10-19 DIAGNOSIS — Z23 Encounter for immunization: Secondary | ICD-10-CM | POA: Diagnosis not present

## 2016-10-19 DIAGNOSIS — Z992 Dependence on renal dialysis: Secondary | ICD-10-CM | POA: Diagnosis not present

## 2016-10-19 DIAGNOSIS — D631 Anemia in chronic kidney disease: Secondary | ICD-10-CM | POA: Diagnosis not present

## 2016-10-19 DIAGNOSIS — N186 End stage renal disease: Secondary | ICD-10-CM | POA: Diagnosis not present

## 2016-10-23 DIAGNOSIS — D631 Anemia in chronic kidney disease: Secondary | ICD-10-CM | POA: Diagnosis not present

## 2016-10-23 DIAGNOSIS — N186 End stage renal disease: Secondary | ICD-10-CM | POA: Diagnosis not present

## 2016-10-23 DIAGNOSIS — D509 Iron deficiency anemia, unspecified: Secondary | ICD-10-CM | POA: Diagnosis not present

## 2016-10-23 DIAGNOSIS — Z23 Encounter for immunization: Secondary | ICD-10-CM | POA: Diagnosis not present

## 2016-10-23 DIAGNOSIS — T85898A Other specified complication of other internal prosthetic devices, implants and grafts, initial encounter: Secondary | ICD-10-CM | POA: Diagnosis not present

## 2016-10-23 DIAGNOSIS — Z992 Dependence on renal dialysis: Secondary | ICD-10-CM | POA: Diagnosis not present

## 2016-10-26 DIAGNOSIS — D631 Anemia in chronic kidney disease: Secondary | ICD-10-CM | POA: Diagnosis not present

## 2016-10-26 DIAGNOSIS — D509 Iron deficiency anemia, unspecified: Secondary | ICD-10-CM | POA: Diagnosis not present

## 2016-10-26 DIAGNOSIS — T85898A Other specified complication of other internal prosthetic devices, implants and grafts, initial encounter: Secondary | ICD-10-CM | POA: Diagnosis not present

## 2016-10-26 DIAGNOSIS — N186 End stage renal disease: Secondary | ICD-10-CM | POA: Diagnosis not present

## 2016-10-26 DIAGNOSIS — Z992 Dependence on renal dialysis: Secondary | ICD-10-CM | POA: Diagnosis not present

## 2016-10-26 DIAGNOSIS — Z23 Encounter for immunization: Secondary | ICD-10-CM | POA: Diagnosis not present

## 2016-10-28 DIAGNOSIS — T85898A Other specified complication of other internal prosthetic devices, implants and grafts, initial encounter: Secondary | ICD-10-CM | POA: Diagnosis not present

## 2016-10-28 DIAGNOSIS — D509 Iron deficiency anemia, unspecified: Secondary | ICD-10-CM | POA: Diagnosis not present

## 2016-10-28 DIAGNOSIS — N186 End stage renal disease: Secondary | ICD-10-CM | POA: Diagnosis not present

## 2016-10-28 DIAGNOSIS — D631 Anemia in chronic kidney disease: Secondary | ICD-10-CM | POA: Diagnosis not present

## 2016-10-28 DIAGNOSIS — Z992 Dependence on renal dialysis: Secondary | ICD-10-CM | POA: Diagnosis not present

## 2016-10-28 DIAGNOSIS — Z23 Encounter for immunization: Secondary | ICD-10-CM | POA: Diagnosis not present

## 2016-10-29 ENCOUNTER — Encounter (INDEPENDENT_AMBULATORY_CARE_PROVIDER_SITE_OTHER): Payer: Medicare Other

## 2016-10-29 ENCOUNTER — Ambulatory Visit (INDEPENDENT_AMBULATORY_CARE_PROVIDER_SITE_OTHER): Payer: Medicare Other | Admitting: Vascular Surgery

## 2016-10-29 DIAGNOSIS — A319 Mycobacterial infection, unspecified: Secondary | ICD-10-CM | POA: Diagnosis not present

## 2016-10-29 DIAGNOSIS — T8571XD Infection and inflammatory reaction due to peritoneal dialysis catheter, subsequent encounter: Secondary | ICD-10-CM | POA: Diagnosis not present

## 2016-11-01 DIAGNOSIS — N186 End stage renal disease: Secondary | ICD-10-CM | POA: Diagnosis not present

## 2016-11-01 DIAGNOSIS — Z992 Dependence on renal dialysis: Secondary | ICD-10-CM | POA: Diagnosis not present

## 2016-11-02 DIAGNOSIS — N186 End stage renal disease: Secondary | ICD-10-CM | POA: Diagnosis not present

## 2016-11-02 DIAGNOSIS — Z992 Dependence on renal dialysis: Secondary | ICD-10-CM | POA: Diagnosis not present

## 2016-11-03 DIAGNOSIS — Z992 Dependence on renal dialysis: Secondary | ICD-10-CM | POA: Diagnosis not present

## 2016-11-03 DIAGNOSIS — N186 End stage renal disease: Secondary | ICD-10-CM | POA: Diagnosis not present

## 2016-11-04 DIAGNOSIS — N186 End stage renal disease: Secondary | ICD-10-CM | POA: Diagnosis not present

## 2016-11-04 DIAGNOSIS — Z992 Dependence on renal dialysis: Secondary | ICD-10-CM | POA: Diagnosis not present

## 2016-11-09 DIAGNOSIS — N186 End stage renal disease: Secondary | ICD-10-CM | POA: Diagnosis not present

## 2016-11-09 DIAGNOSIS — Z992 Dependence on renal dialysis: Secondary | ICD-10-CM | POA: Diagnosis not present

## 2016-11-10 DIAGNOSIS — N186 End stage renal disease: Secondary | ICD-10-CM | POA: Diagnosis not present

## 2016-11-10 DIAGNOSIS — Z992 Dependence on renal dialysis: Secondary | ICD-10-CM | POA: Diagnosis not present

## 2016-11-12 DIAGNOSIS — N186 End stage renal disease: Secondary | ICD-10-CM | POA: Diagnosis not present

## 2016-11-12 DIAGNOSIS — D631 Anemia in chronic kidney disease: Secondary | ICD-10-CM | POA: Diagnosis not present

## 2016-11-12 DIAGNOSIS — D509 Iron deficiency anemia, unspecified: Secondary | ICD-10-CM | POA: Diagnosis not present

## 2016-11-12 DIAGNOSIS — Z992 Dependence on renal dialysis: Secondary | ICD-10-CM | POA: Diagnosis not present

## 2016-11-13 DIAGNOSIS — N186 End stage renal disease: Secondary | ICD-10-CM | POA: Diagnosis not present

## 2016-11-13 DIAGNOSIS — D631 Anemia in chronic kidney disease: Secondary | ICD-10-CM | POA: Diagnosis not present

## 2016-11-13 DIAGNOSIS — Z992 Dependence on renal dialysis: Secondary | ICD-10-CM | POA: Diagnosis not present

## 2016-11-13 DIAGNOSIS — D509 Iron deficiency anemia, unspecified: Secondary | ICD-10-CM | POA: Diagnosis not present

## 2016-11-14 DIAGNOSIS — Z992 Dependence on renal dialysis: Secondary | ICD-10-CM | POA: Diagnosis not present

## 2016-11-14 DIAGNOSIS — D631 Anemia in chronic kidney disease: Secondary | ICD-10-CM | POA: Diagnosis not present

## 2016-11-14 DIAGNOSIS — D509 Iron deficiency anemia, unspecified: Secondary | ICD-10-CM | POA: Diagnosis not present

## 2016-11-14 DIAGNOSIS — N186 End stage renal disease: Secondary | ICD-10-CM | POA: Diagnosis not present

## 2016-11-15 DIAGNOSIS — Z992 Dependence on renal dialysis: Secondary | ICD-10-CM | POA: Diagnosis not present

## 2016-11-15 DIAGNOSIS — N186 End stage renal disease: Secondary | ICD-10-CM | POA: Diagnosis not present

## 2016-11-15 DIAGNOSIS — D509 Iron deficiency anemia, unspecified: Secondary | ICD-10-CM | POA: Diagnosis not present

## 2016-11-15 DIAGNOSIS — D631 Anemia in chronic kidney disease: Secondary | ICD-10-CM | POA: Diagnosis not present

## 2016-11-16 DIAGNOSIS — Z992 Dependence on renal dialysis: Secondary | ICD-10-CM | POA: Diagnosis not present

## 2016-11-16 DIAGNOSIS — D509 Iron deficiency anemia, unspecified: Secondary | ICD-10-CM | POA: Diagnosis not present

## 2016-11-16 DIAGNOSIS — E119 Type 2 diabetes mellitus without complications: Secondary | ICD-10-CM | POA: Diagnosis not present

## 2016-11-16 DIAGNOSIS — D631 Anemia in chronic kidney disease: Secondary | ICD-10-CM | POA: Diagnosis not present

## 2016-11-16 DIAGNOSIS — N186 End stage renal disease: Secondary | ICD-10-CM | POA: Diagnosis not present

## 2016-11-17 DIAGNOSIS — N186 End stage renal disease: Secondary | ICD-10-CM | POA: Diagnosis not present

## 2016-11-17 DIAGNOSIS — D509 Iron deficiency anemia, unspecified: Secondary | ICD-10-CM | POA: Diagnosis not present

## 2016-11-17 DIAGNOSIS — D631 Anemia in chronic kidney disease: Secondary | ICD-10-CM | POA: Diagnosis not present

## 2016-11-17 DIAGNOSIS — Z992 Dependence on renal dialysis: Secondary | ICD-10-CM | POA: Diagnosis not present

## 2016-11-18 DIAGNOSIS — Z992 Dependence on renal dialysis: Secondary | ICD-10-CM | POA: Diagnosis not present

## 2016-11-18 DIAGNOSIS — N186 End stage renal disease: Secondary | ICD-10-CM | POA: Diagnosis not present

## 2016-11-18 DIAGNOSIS — D509 Iron deficiency anemia, unspecified: Secondary | ICD-10-CM | POA: Diagnosis not present

## 2016-11-18 DIAGNOSIS — D631 Anemia in chronic kidney disease: Secondary | ICD-10-CM | POA: Diagnosis not present

## 2016-11-19 DIAGNOSIS — D631 Anemia in chronic kidney disease: Secondary | ICD-10-CM | POA: Diagnosis not present

## 2016-11-19 DIAGNOSIS — D509 Iron deficiency anemia, unspecified: Secondary | ICD-10-CM | POA: Diagnosis not present

## 2016-11-19 DIAGNOSIS — Z992 Dependence on renal dialysis: Secondary | ICD-10-CM | POA: Diagnosis not present

## 2016-11-19 DIAGNOSIS — N186 End stage renal disease: Secondary | ICD-10-CM | POA: Diagnosis not present

## 2016-11-20 DIAGNOSIS — D509 Iron deficiency anemia, unspecified: Secondary | ICD-10-CM | POA: Diagnosis not present

## 2016-11-20 DIAGNOSIS — Z992 Dependence on renal dialysis: Secondary | ICD-10-CM | POA: Diagnosis not present

## 2016-11-20 DIAGNOSIS — N186 End stage renal disease: Secondary | ICD-10-CM | POA: Diagnosis not present

## 2016-11-20 DIAGNOSIS — D631 Anemia in chronic kidney disease: Secondary | ICD-10-CM | POA: Diagnosis not present

## 2016-11-21 DIAGNOSIS — D631 Anemia in chronic kidney disease: Secondary | ICD-10-CM | POA: Diagnosis not present

## 2016-11-21 DIAGNOSIS — Z992 Dependence on renal dialysis: Secondary | ICD-10-CM | POA: Diagnosis not present

## 2016-11-21 DIAGNOSIS — D509 Iron deficiency anemia, unspecified: Secondary | ICD-10-CM | POA: Diagnosis not present

## 2016-11-21 DIAGNOSIS — N186 End stage renal disease: Secondary | ICD-10-CM | POA: Diagnosis not present

## 2016-11-22 DIAGNOSIS — D509 Iron deficiency anemia, unspecified: Secondary | ICD-10-CM | POA: Diagnosis not present

## 2016-11-22 DIAGNOSIS — N186 End stage renal disease: Secondary | ICD-10-CM | POA: Diagnosis not present

## 2016-11-22 DIAGNOSIS — D631 Anemia in chronic kidney disease: Secondary | ICD-10-CM | POA: Diagnosis not present

## 2016-11-22 DIAGNOSIS — Z992 Dependence on renal dialysis: Secondary | ICD-10-CM | POA: Diagnosis not present

## 2016-11-23 DIAGNOSIS — N186 End stage renal disease: Secondary | ICD-10-CM | POA: Diagnosis not present

## 2016-11-23 DIAGNOSIS — D509 Iron deficiency anemia, unspecified: Secondary | ICD-10-CM | POA: Diagnosis not present

## 2016-11-23 DIAGNOSIS — Z992 Dependence on renal dialysis: Secondary | ICD-10-CM | POA: Diagnosis not present

## 2016-11-23 DIAGNOSIS — D631 Anemia in chronic kidney disease: Secondary | ICD-10-CM | POA: Diagnosis not present

## 2016-11-24 DIAGNOSIS — D509 Iron deficiency anemia, unspecified: Secondary | ICD-10-CM | POA: Diagnosis not present

## 2016-11-24 DIAGNOSIS — D631 Anemia in chronic kidney disease: Secondary | ICD-10-CM | POA: Diagnosis not present

## 2016-11-24 DIAGNOSIS — N186 End stage renal disease: Secondary | ICD-10-CM | POA: Diagnosis not present

## 2016-11-24 DIAGNOSIS — Z992 Dependence on renal dialysis: Secondary | ICD-10-CM | POA: Diagnosis not present

## 2016-11-25 DIAGNOSIS — Z992 Dependence on renal dialysis: Secondary | ICD-10-CM | POA: Diagnosis not present

## 2016-11-25 DIAGNOSIS — D631 Anemia in chronic kidney disease: Secondary | ICD-10-CM | POA: Diagnosis not present

## 2016-11-25 DIAGNOSIS — N186 End stage renal disease: Secondary | ICD-10-CM | POA: Diagnosis not present

## 2016-11-25 DIAGNOSIS — D509 Iron deficiency anemia, unspecified: Secondary | ICD-10-CM | POA: Diagnosis not present

## 2016-11-26 DIAGNOSIS — N186 End stage renal disease: Secondary | ICD-10-CM | POA: Diagnosis not present

## 2016-11-26 DIAGNOSIS — D631 Anemia in chronic kidney disease: Secondary | ICD-10-CM | POA: Diagnosis not present

## 2016-11-26 DIAGNOSIS — Z992 Dependence on renal dialysis: Secondary | ICD-10-CM | POA: Diagnosis not present

## 2016-11-26 DIAGNOSIS — D509 Iron deficiency anemia, unspecified: Secondary | ICD-10-CM | POA: Diagnosis not present

## 2016-11-27 DIAGNOSIS — D509 Iron deficiency anemia, unspecified: Secondary | ICD-10-CM | POA: Diagnosis not present

## 2016-11-27 DIAGNOSIS — Z992 Dependence on renal dialysis: Secondary | ICD-10-CM | POA: Diagnosis not present

## 2016-11-27 DIAGNOSIS — D631 Anemia in chronic kidney disease: Secondary | ICD-10-CM | POA: Diagnosis not present

## 2016-11-27 DIAGNOSIS — N186 End stage renal disease: Secondary | ICD-10-CM | POA: Diagnosis not present

## 2016-11-28 DIAGNOSIS — D509 Iron deficiency anemia, unspecified: Secondary | ICD-10-CM | POA: Diagnosis not present

## 2016-11-28 DIAGNOSIS — N186 End stage renal disease: Secondary | ICD-10-CM | POA: Diagnosis not present

## 2016-11-28 DIAGNOSIS — D631 Anemia in chronic kidney disease: Secondary | ICD-10-CM | POA: Diagnosis not present

## 2016-11-28 DIAGNOSIS — Z992 Dependence on renal dialysis: Secondary | ICD-10-CM | POA: Diagnosis not present

## 2016-11-29 DIAGNOSIS — Z992 Dependence on renal dialysis: Secondary | ICD-10-CM | POA: Diagnosis not present

## 2016-11-29 DIAGNOSIS — D509 Iron deficiency anemia, unspecified: Secondary | ICD-10-CM | POA: Diagnosis not present

## 2016-11-29 DIAGNOSIS — D631 Anemia in chronic kidney disease: Secondary | ICD-10-CM | POA: Diagnosis not present

## 2016-11-29 DIAGNOSIS — N186 End stage renal disease: Secondary | ICD-10-CM | POA: Diagnosis not present

## 2016-11-30 DIAGNOSIS — D509 Iron deficiency anemia, unspecified: Secondary | ICD-10-CM | POA: Diagnosis not present

## 2016-11-30 DIAGNOSIS — D631 Anemia in chronic kidney disease: Secondary | ICD-10-CM | POA: Diagnosis not present

## 2016-11-30 DIAGNOSIS — N186 End stage renal disease: Secondary | ICD-10-CM | POA: Diagnosis not present

## 2016-11-30 DIAGNOSIS — Z992 Dependence on renal dialysis: Secondary | ICD-10-CM | POA: Diagnosis not present

## 2016-12-01 ENCOUNTER — Other Ambulatory Visit (INDEPENDENT_AMBULATORY_CARE_PROVIDER_SITE_OTHER): Payer: Self-pay | Admitting: Vascular Surgery

## 2016-12-01 ENCOUNTER — Encounter (INDEPENDENT_AMBULATORY_CARE_PROVIDER_SITE_OTHER): Payer: Self-pay

## 2016-12-01 DIAGNOSIS — N186 End stage renal disease: Secondary | ICD-10-CM | POA: Diagnosis not present

## 2016-12-01 DIAGNOSIS — D631 Anemia in chronic kidney disease: Secondary | ICD-10-CM | POA: Diagnosis not present

## 2016-12-01 DIAGNOSIS — D509 Iron deficiency anemia, unspecified: Secondary | ICD-10-CM | POA: Diagnosis not present

## 2016-12-01 DIAGNOSIS — Z992 Dependence on renal dialysis: Secondary | ICD-10-CM | POA: Diagnosis not present

## 2016-12-02 DIAGNOSIS — D509 Iron deficiency anemia, unspecified: Secondary | ICD-10-CM | POA: Diagnosis not present

## 2016-12-02 DIAGNOSIS — N186 End stage renal disease: Secondary | ICD-10-CM | POA: Diagnosis not present

## 2016-12-02 DIAGNOSIS — Z992 Dependence on renal dialysis: Secondary | ICD-10-CM | POA: Diagnosis not present

## 2016-12-02 DIAGNOSIS — D631 Anemia in chronic kidney disease: Secondary | ICD-10-CM | POA: Diagnosis not present

## 2016-12-03 ENCOUNTER — Emergency Department: Payer: Medicare Other

## 2016-12-03 ENCOUNTER — Encounter: Payer: Self-pay | Admitting: Emergency Medicine

## 2016-12-03 ENCOUNTER — Observation Stay (HOSPITAL_BASED_OUTPATIENT_CLINIC_OR_DEPARTMENT_OTHER)
Admit: 2016-12-03 | Discharge: 2016-12-03 | Disposition: A | Discharge status: Home / Self Care | Payer: Medicare Other | Attending: Internal Medicine | Admitting: Internal Medicine

## 2016-12-03 ENCOUNTER — Observation Stay
Admission: EM | Admit: 2016-12-03 | Discharge: 2016-12-04 | Disposition: A | Discharge status: Home / Self Care | Payer: Medicare Other | Attending: Internal Medicine | Admitting: Internal Medicine

## 2016-12-03 ENCOUNTER — Other Ambulatory Visit: Payer: Self-pay

## 2016-12-03 DIAGNOSIS — I5032 Chronic diastolic (congestive) heart failure: Secondary | ICD-10-CM | POA: Insufficient documentation

## 2016-12-03 DIAGNOSIS — R55 Syncope and collapse: Secondary | ICD-10-CM

## 2016-12-03 DIAGNOSIS — W19XXXA Unspecified fall, initial encounter: Secondary | ICD-10-CM | POA: Insufficient documentation

## 2016-12-03 DIAGNOSIS — N186 End stage renal disease: Secondary | ICD-10-CM | POA: Insufficient documentation

## 2016-12-03 DIAGNOSIS — I951 Orthostatic hypotension: Secondary | ICD-10-CM | POA: Diagnosis not present

## 2016-12-03 DIAGNOSIS — M4802 Spinal stenosis, cervical region: Secondary | ICD-10-CM | POA: Insufficient documentation

## 2016-12-03 DIAGNOSIS — Z87891 Personal history of nicotine dependence: Secondary | ICD-10-CM | POA: Diagnosis not present

## 2016-12-03 DIAGNOSIS — M50223 Other cervical disc displacement at C6-C7 level: Secondary | ICD-10-CM | POA: Insufficient documentation

## 2016-12-03 DIAGNOSIS — Z7982 Long term (current) use of aspirin: Secondary | ICD-10-CM | POA: Diagnosis not present

## 2016-12-03 DIAGNOSIS — Y9301 Activity, walking, marching and hiking: Secondary | ICD-10-CM | POA: Insufficient documentation

## 2016-12-03 DIAGNOSIS — J8410 Pulmonary fibrosis, unspecified: Secondary | ICD-10-CM | POA: Insufficient documentation

## 2016-12-03 DIAGNOSIS — E1122 Type 2 diabetes mellitus with diabetic chronic kidney disease: Secondary | ICD-10-CM | POA: Diagnosis not present

## 2016-12-03 DIAGNOSIS — I272 Pulmonary hypertension, unspecified: Secondary | ICD-10-CM | POA: Diagnosis not present

## 2016-12-03 DIAGNOSIS — E1140 Type 2 diabetes mellitus with diabetic neuropathy, unspecified: Secondary | ICD-10-CM | POA: Insufficient documentation

## 2016-12-03 DIAGNOSIS — R112 Nausea with vomiting, unspecified: Secondary | ICD-10-CM | POA: Diagnosis not present

## 2016-12-03 DIAGNOSIS — J449 Chronic obstructive pulmonary disease, unspecified: Secondary | ICD-10-CM | POA: Diagnosis not present

## 2016-12-03 DIAGNOSIS — I6529 Occlusion and stenosis of unspecified carotid artery: Secondary | ICD-10-CM | POA: Diagnosis not present

## 2016-12-03 DIAGNOSIS — Z992 Dependence on renal dialysis: Secondary | ICD-10-CM | POA: Diagnosis not present

## 2016-12-03 DIAGNOSIS — E1151 Type 2 diabetes mellitus with diabetic peripheral angiopathy without gangrene: Secondary | ICD-10-CM | POA: Insufficient documentation

## 2016-12-03 DIAGNOSIS — S0990XA Unspecified injury of head, initial encounter: Secondary | ICD-10-CM | POA: Insufficient documentation

## 2016-12-03 DIAGNOSIS — Z888 Allergy status to other drugs, medicaments and biological substances status: Secondary | ICD-10-CM | POA: Insufficient documentation

## 2016-12-03 DIAGNOSIS — Z79899 Other long term (current) drug therapy: Secondary | ICD-10-CM | POA: Insufficient documentation

## 2016-12-03 DIAGNOSIS — S299XXA Unspecified injury of thorax, initial encounter: Secondary | ICD-10-CM | POA: Diagnosis not present

## 2016-12-03 DIAGNOSIS — E119 Type 2 diabetes mellitus without complications: Secondary | ICD-10-CM | POA: Diagnosis not present

## 2016-12-03 DIAGNOSIS — R7989 Other specified abnormal findings of blood chemistry: Secondary | ICD-10-CM | POA: Diagnosis not present

## 2016-12-03 DIAGNOSIS — S199XXA Unspecified injury of neck, initial encounter: Secondary | ICD-10-CM | POA: Diagnosis not present

## 2016-12-03 DIAGNOSIS — Z9889 Other specified postprocedural states: Secondary | ICD-10-CM | POA: Insufficient documentation

## 2016-12-03 DIAGNOSIS — M47812 Spondylosis without myelopathy or radiculopathy, cervical region: Secondary | ICD-10-CM | POA: Diagnosis not present

## 2016-12-03 DIAGNOSIS — Z833 Family history of diabetes mellitus: Secondary | ICD-10-CM | POA: Insufficient documentation

## 2016-12-03 DIAGNOSIS — D509 Iron deficiency anemia, unspecified: Secondary | ICD-10-CM | POA: Diagnosis not present

## 2016-12-03 DIAGNOSIS — I132 Hypertensive heart and chronic kidney disease with heart failure and with stage 5 chronic kidney disease, or end stage renal disease: Secondary | ICD-10-CM | POA: Diagnosis not present

## 2016-12-03 DIAGNOSIS — I2510 Atherosclerotic heart disease of native coronary artery without angina pectoris: Secondary | ICD-10-CM | POA: Diagnosis not present

## 2016-12-03 DIAGNOSIS — I252 Old myocardial infarction: Secondary | ICD-10-CM | POA: Insufficient documentation

## 2016-12-03 DIAGNOSIS — Z8249 Family history of ischemic heart disease and other diseases of the circulatory system: Secondary | ICD-10-CM | POA: Insufficient documentation

## 2016-12-03 DIAGNOSIS — Z6830 Body mass index (BMI) 30.0-30.9, adult: Secondary | ICD-10-CM | POA: Insufficient documentation

## 2016-12-03 DIAGNOSIS — E11319 Type 2 diabetes mellitus with unspecified diabetic retinopathy without macular edema: Secondary | ICD-10-CM | POA: Diagnosis not present

## 2016-12-03 DIAGNOSIS — R748 Abnormal levels of other serum enzymes: Secondary | ICD-10-CM | POA: Diagnosis present

## 2016-12-03 DIAGNOSIS — R778 Other specified abnormalities of plasma proteins: Secondary | ICD-10-CM

## 2016-12-03 DIAGNOSIS — E860 Dehydration: Secondary | ICD-10-CM | POA: Insufficient documentation

## 2016-12-03 DIAGNOSIS — D631 Anemia in chronic kidney disease: Secondary | ICD-10-CM | POA: Diagnosis not present

## 2016-12-03 DIAGNOSIS — I129 Hypertensive chronic kidney disease with stage 1 through stage 4 chronic kidney disease, or unspecified chronic kidney disease: Secondary | ICD-10-CM | POA: Diagnosis not present

## 2016-12-03 DIAGNOSIS — I10 Essential (primary) hypertension: Secondary | ICD-10-CM | POA: Diagnosis not present

## 2016-12-03 LAB — URINALYSIS, COMPLETE (UACMP) WITH MICROSCOPIC
BACTERIA UA: NONE SEEN
BILIRUBIN URINE: NEGATIVE
KETONES UR: NEGATIVE mg/dL
NITRITE: NEGATIVE
Specific Gravity, Urine: 1.016 (ref 1.005–1.030)
pH: 5 (ref 5.0–8.0)

## 2016-12-03 LAB — CBC WITH DIFFERENTIAL/PLATELET
BASOS PCT: 1 %
Basophils Absolute: 0.1 10*3/uL (ref 0–0.1)
EOS ABS: 0.4 10*3/uL (ref 0–0.7)
Eosinophils Relative: 4 %
HEMATOCRIT: 41.8 % (ref 35.0–47.0)
HEMOGLOBIN: 13.7 g/dL (ref 12.0–16.0)
LYMPHS ABS: 1.8 10*3/uL (ref 1.0–3.6)
Lymphocytes Relative: 16 %
MCH: 30.3 pg (ref 26.0–34.0)
MCHC: 32.8 g/dL (ref 32.0–36.0)
MCV: 92.6 fL (ref 80.0–100.0)
MONOS PCT: 8 %
Monocytes Absolute: 1 10*3/uL — ABNORMAL HIGH (ref 0.2–0.9)
NEUTROS ABS: 8.3 10*3/uL — AB (ref 1.4–6.5)
NEUTROS PCT: 71 %
Platelets: 243 10*3/uL (ref 150–440)
RBC: 4.52 MIL/uL (ref 3.80–5.20)
RDW: 16.5 % — ABNORMAL HIGH (ref 11.5–14.5)
WBC: 11.6 10*3/uL — AB (ref 3.6–11.0)

## 2016-12-03 LAB — COMPREHENSIVE METABOLIC PANEL
ALT: 16 U/L (ref 14–54)
ANION GAP: 19 — AB (ref 5–15)
AST: 23 U/L (ref 15–41)
Albumin: 3.7 g/dL (ref 3.5–5.0)
Alkaline Phosphatase: 80 U/L (ref 38–126)
BUN: 57 mg/dL — ABNORMAL HIGH (ref 6–20)
CHLORIDE: 93 mmol/L — AB (ref 101–111)
CO2: 25 mmol/L (ref 22–32)
CREATININE: 13.1 mg/dL — AB (ref 0.44–1.00)
Calcium: 10.6 mg/dL — ABNORMAL HIGH (ref 8.9–10.3)
GFR, EST AFRICAN AMERICAN: 3 mL/min — AB (ref >60)
GFR, EST NON AFRICAN AMERICAN: 3 mL/min — AB (ref >60)
Glucose, Bld: 197 mg/dL — ABNORMAL HIGH (ref 65–99)
POTASSIUM: 4.1 mmol/L (ref 3.5–5.1)
SODIUM: 137 mmol/L (ref 135–145)
Total Bilirubin: 0.6 mg/dL (ref 0.3–1.2)
Total Protein: 7.8 g/dL (ref 6.5–8.1)

## 2016-12-03 LAB — TROPONIN I: TROPONIN I: 0.06 ng/mL — AB (ref <0.03)

## 2016-12-03 LAB — TSH: TSH: 8.589 u[IU]/mL — AB (ref 0.350–4.500)

## 2016-12-03 MED ORDER — SODIUM CHLORIDE 0.9% FLUSH
3.0000 mL | Freq: Two times a day (BID) | INTRAVENOUS | Status: DC
  Administered 2016-12-03 (×2): 3 mL via INTRAVENOUS

## 2016-12-03 MED ORDER — ISOSORBIDE MONONITRATE ER 30 MG PO TB24
30.0000 mg | ORAL_TABLET | Freq: Every day | ORAL | Status: DC
  Administered 2016-12-04: 30 mg via ORAL
  Filled 2016-12-03: qty 1

## 2016-12-03 MED ORDER — CALCIUM ACETATE (PHOS BINDER) 667 MG PO CAPS
2001.0000 mg | ORAL_CAPSULE | Freq: Three times a day (TID) | ORAL | Status: DC
  Administered 2016-12-03 – 2016-12-04 (×3): 2001 mg via ORAL
  Filled 2016-12-03 (×3): qty 3

## 2016-12-03 MED ORDER — HEPARIN SODIUM (PORCINE) 5000 UNIT/ML IJ SOLN
5000.0000 [IU] | Freq: Three times a day (TID) | INTRAMUSCULAR | Status: DC
  Administered 2016-12-03 (×2): 5000 [IU] via SUBCUTANEOUS
  Filled 2016-12-03 (×2): qty 1

## 2016-12-03 MED ORDER — ONDANSETRON HCL 4 MG PO TABS: 4.0000 mg | ORAL_TABLET | Freq: Four times a day (QID) | ORAL | Status: DC | PRN

## 2016-12-03 MED ORDER — ACETAMINOPHEN 325 MG PO TABS
650.0000 mg | ORAL_TABLET | Freq: Four times a day (QID) | ORAL | Status: DC | PRN
  Administered 2016-12-03 – 2016-12-04 (×3): 650 mg via ORAL
  Filled 2016-12-03 (×3): qty 2

## 2016-12-03 MED ORDER — FERROUS SULFATE 325 (65 FE) MG PO TABS
325.0000 mg | ORAL_TABLET | Freq: Two times a day (BID) | ORAL | Status: DC
  Administered 2016-12-03 – 2016-12-04 (×2): 325 mg via ORAL
  Filled 2016-12-03 (×2): qty 1

## 2016-12-03 MED ORDER — SODIUM CHLORIDE 0.9 % IV SOLN
INTRAVENOUS | Status: DC
  Administered 2016-12-03: 13:00:00 via INTRAVENOUS

## 2016-12-03 MED ORDER — SODIUM CHLORIDE 0.9 % IV SOLN
Freq: Once | INTRAVENOUS | Status: AC
  Administered 2016-12-03: 10:00:00 via INTRAVENOUS

## 2016-12-03 MED ORDER — MAGNESIUM OXIDE 400 (241.3 MG) MG PO TABS
400.0000 mg | ORAL_TABLET | Freq: Every day | ORAL | Status: DC
  Administered 2016-12-04: 400 mg via ORAL
  Filled 2016-12-03: qty 1

## 2016-12-03 MED ORDER — TOBRAMYCIN SULFATE 80 MG/2ML IJ SOLN: 160.0000 mg | INTRAMUSCULAR | Status: DC

## 2016-12-03 MED ORDER — BISACODYL 5 MG PO TBEC
5.0000 mg | DELAYED_RELEASE_TABLET | Freq: Every day | ORAL | Status: DC | PRN
  Administered 2016-12-03: 5 mg via ORAL
  Filled 2016-12-03: qty 1

## 2016-12-03 MED ORDER — HYDRALAZINE HCL 25 MG PO TABS
25.0000 mg | ORAL_TABLET | Freq: Three times a day (TID) | ORAL | Status: DC
  Administered 2016-12-03 (×2): 25 mg via ORAL
  Filled 2016-12-03 (×2): qty 1

## 2016-12-03 MED ORDER — ACETAMINOPHEN 650 MG RE SUPP: 650.0000 mg | Freq: Four times a day (QID) | RECTAL | Status: DC | PRN

## 2016-12-03 MED ORDER — ALUM & MAG HYDROXIDE-SIMETH 200-200-20 MG/5ML PO SUSP: 30.0000 mL | Freq: Four times a day (QID) | ORAL | Status: DC | PRN

## 2016-12-03 MED ORDER — LORATADINE 10 MG PO TABS
10.0000 mg | ORAL_TABLET | Freq: Every day | ORAL | Status: DC
  Administered 2016-12-04: 10 mg via ORAL
  Filled 2016-12-03: qty 1

## 2016-12-03 MED ORDER — AMLODIPINE BESYLATE 5 MG PO TABS: 5.0000 mg | ORAL_TABLET | Freq: Every day | ORAL | Status: DC

## 2016-12-03 MED ORDER — ALBUTEROL SULFATE (2.5 MG/3ML) 0.083% IN NEBU: 3.0000 mL | INHALATION_SOLUTION | Freq: Four times a day (QID) | RESPIRATORY_TRACT | Status: DC | PRN

## 2016-12-03 MED ORDER — ASPIRIN EC 81 MG PO TBEC
81.0000 mg | DELAYED_RELEASE_TABLET | Freq: Every day | ORAL | Status: DC
  Administered 2016-12-04: 81 mg via ORAL
  Filled 2016-12-03: qty 1

## 2016-12-03 MED ORDER — ONDANSETRON HCL 4 MG/2ML IJ SOLN
INTRAMUSCULAR | Status: AC
  Administered 2016-12-03: 07:00:00
  Filled 2016-12-03: qty 2

## 2016-12-03 MED ORDER — SENNOSIDES-DOCUSATE SODIUM 8.6-50 MG PO TABS
1.0000 | ORAL_TABLET | Freq: Every evening | ORAL | Status: DC | PRN
  Administered 2016-12-03: 1 via ORAL
  Filled 2016-12-03: qty 1

## 2016-12-03 MED ORDER — IBUPROFEN 400 MG PO TABS
400.0000 mg | ORAL_TABLET | Freq: Four times a day (QID) | ORAL | Status: DC | PRN
  Filled 2016-12-03: qty 1

## 2016-12-03 NOTE — ED Notes (Signed)
Patient transported to CT 

## 2016-12-03 NOTE — Progress Notes (Signed)
VS were completed by Cyril Mourning with physical therapy

## 2016-12-03 NOTE — H&P (Signed)
[facility] [facility] [place] NAME: Jacqueline Rodriguez    MR#:  [id number]  DATE OF BIRTH:  1960/06/22  DATE OF ADMISSION:  12/03/2016  PRIMARY CARE PHYSICIAN: Hortencia Pilar, MD   REQUESTING/REFERRING PHYSICIAN: Earleen Newport, MD  CHIEF COMPLAINT:   Chief Complaint  Patient presents with  . Emesis  . Weakness   HISTORY OF PRESENT ILLNESS:  Jacqueline Rodriguez  is a 56 y.o. female with a known history of end-stage renal disease on peritoneal dialysis currently who presents to the ED for a fall and possible syncope.  Husband states she was walking this morning and her legs gave way because she was weak.  She subsequently leaned against the wall at some point looks like she passed out.  Her nose was bleeding from bumping her nose on the wall.  Husband states she then passed out and convulsed briefly. Per husband she was incoherent for few seconds and had eyes rolled over as well. This has reportedly happened at one point in the past when her blood sugar was low.  She feels this time she removed much more on last PD session. EDP discussed case with Dr Holley Raring who requested to bring her in and start fluids as he's concerned for dehydration. PAST MEDICAL HISTORY:   Past Medical History:  Diagnosis Date  . Anemia   . Chronic bronchitis ([place])   . Chronic diastolic CHF (congestive heart failure) ([place])   . CKD (chronic kidney disease), stage III ([place])   . COPD (chronic obstructive pulmonary disease) ([place])   . Diabetes mellitus with renal complications ([place])   . Essential hypertension   . GERD (gastroesophageal reflux disease)   . Obesity   . Peripheral vascular disease ([place])   . Pulmonary hypertension ([place])   . Renal insufficiency 09/19/2015   Stage 3 CKD. Beginning dialysis.    PAST SURGICAL HISTORY:   Past Surgical History:  Procedure Laterality Date  . A/V FISTULAGRAM N/A 09/16/2016   Procedure: A/V Fistulagram;  Surgeon: Algernon Huxley, MD;  Location:  [facility];  Service: Cardiovascular;  Laterality: N/A;  . AV FISTULA PLACEMENT Left 10/01/2015   Procedure: ARTERIOVENOUS (AV) FISTULA CREATION ( BRACHIAL CEPHALIC );  Surgeon: Algernon Huxley, MD;  Location: [facility];  Service: Vascular;  Laterality: Left;  . CAPD INSERTION N/A 06/10/2016   Procedure: LAPAROSCOPIC INSERTION CONTINUOUS AMBULATORY PERITONEAL DIALYSIS  (CAPD) CATHETER;  Surgeon: Algernon Huxley, MD;  Location: [facility];  Service: Vascular;  Laterality: N/A;  . DIALYSIS/PERMA CATHETER INSERTION  09/16/2016   Procedure: DIALYSIS/PERMA CATHETER INSERTION;  Surgeon: Algernon Huxley, MD;  Location: [facility];  Service: Cardiovascular;;  . PERIPHERAL VASCULAR CATHETERIZATION N/A 07/21/2015   Procedure: Dialysis/Perma Catheter;  Surgeon: Algernon Huxley, MD;  Location: [facility];  Service: Cardiovascular;  Laterality: N/A;  . PERIPHERAL VASCULAR CATHETERIZATION N/A 12/11/2015   Procedure: Dialysis/Perma Catheter Removal;  Surgeon: Algernon Huxley, MD;  Location: [facility];  Service: Cardiovascular;  Laterality: N/A;    SOCIAL HISTORY:   Social History   Tobacco Use  . Smoking status: Former Smoker    Types: Cigarettes    Last attempt to quit: 03/24/1985    Years since quitting: 31.7  . Smokeless tobacco: Never Used  Substance Use Topics  . Alcohol use: No    Alcohol/week: 0.0 oz    FAMILY HISTORY:   Family History  Problem Relation Age of Onset  .  Hypertension Mother   . Diabetes Mellitus II Mother   . CAD Father   . Hypertension Father   . Heart attack Father     DRUG ALLERGIES:   Allergies  Allergen Reactions  . Ambien [Zolpidem] Other (See Comments)    hallucinations    REVIEW OF SYSTEMS:   Review of Systems  Constitutional: Positive for malaise/fatigue. Negative for chills, fever and weight loss.  HENT: Negative for nosebleeds and sore throat.   Eyes: Negative for blurred vision.  Respiratory: Negative for cough, shortness of breath  and wheezing.   Cardiovascular: Negative for chest pain, orthopnea, leg swelling and PND.  Gastrointestinal: Negative for abdominal pain, constipation, diarrhea, heartburn, nausea and vomiting.  Genitourinary: Negative for dysuria and urgency.  Musculoskeletal: Positive for falls. Negative for back pain.  Skin: Negative for rash.  Neurological: Positive for dizziness and weakness. Negative for speech change, focal weakness and headaches.  Endo/Heme/Allergies: Does not bruise/bleed easily.  Psychiatric/Behavioral: Negative for depression.   MEDICATIONS AT HOME:   Prior to Admission medications   Medication Sig Start Date End Date Taking? Authorizing Provider  amLODipine (NORVASC) 5 MG tablet Take 1 tablet (5 mg total) by mouth daily. 07/24/15  Yes Vaughan Basta, MD  aspirin EC 81 MG EC tablet Take 1 tablet (81 mg total) by mouth daily. Patient taking differently: Take 162 mg by mouth at bedtime.  03/20/15  Yes Vaughan Basta, MD  azithromycin [facility]) 250 MG tablet One tablet daily as per DR Ola Spurr infectious disease 09/17/16  Yes Loletha Grayer, MD  calcium acetate (PHOSLO) 667 MG capsule Take 3 capsules (2,001 mg total) by mouth 3 (three) times daily with meals. 09/17/16  Yes Wieting, Richard, MD  Calcium-Magnesium-Vitamin D (CALCIUM 1200+D3 PO) Take 1 tablet by mouth daily.   Yes [provider]  cetirizine (ZYRTEC) 10 MG tablet Take 10 mg by mouth daily as needed for allergies.   Yes [provider]  ferrous sulfate 325 (65 FE) MG EC tablet Take 1 tablet (325 mg total) by mouth 2 (two) times daily. 09/17/16 09/17/17 Yes Wieting, Richard, MD  furosemide (LASIX) 40 MG tablet Take 1 tablet (40 mg total) by mouth daily. 09/17/16  Yes Wieting, Richard, MD  gabapentin (NEURONTIN) 100 MG capsule Take 100 mg by mouth 3 (three) times daily as needed. For neuropathy. 03/05/15  Yes [provider]  GLUCOSAMINE-CHONDROITIN PO Take 4,000 mg by mouth daily.   Yes  [provider]  hydrALAZINE (APRESOLINE) 25 MG tablet Take 25 mg by mouth 3 (three) times daily.   Yes [provider]  isosorbide mononitrate (IMDUR) 30 MG 24 hr tablet Take 30 mg by mouth daily.   Yes [provider]  magnesium oxide (MAG-OX) 400 (241.3 Mg) MG tablet Take 1 tablet (400 mg total) by mouth daily. 09/17/16  Yes Wieting, Richard, MD  acetaminophen (TYLENOL) 500 MG tablet Take 500 mg by mouth every 6 (six) hours as needed.    [provider]  albuterol (PROVENTIL HFA;VENTOLIN HFA) 108 (90 Base) MCG/ACT inhaler Inhale 2 puffs into the lungs every 6 (six) hours as needed for wheezing or shortness of breath. 01/08/15   Gregor Hams, MD  gentamicin cream (GARAMYCIN) 0.1 % Apply 1 application topically daily. 06/25/16   [provider]  HYDROcodone-acetaminophen (NORCO) 5-325 MG tablet Take 1 tablet by mouth every 6 (six) hours as needed for moderate pain. 06/10/16   Algernon Huxley, MD  meclizine (ANTIVERT) 25 MG tablet Take 1 tablet (25 mg  total) by mouth 3 (three) times daily as needed for dizziness. 12/08/15   Nance Pear, MD  ondansetron (ZOFRAN) 4 MG tablet Take 1 tablet (4 mg total) by mouth every 6 (six) hours as needed for nausea. 09/17/16   Loletha Grayer, MD  tobramycin 160 mg in dextrose 5 % 50 mL Inject 160 mg into the vein every Monday, Wednesday, and Friday at 6 PM. 09/17/16   Loletha Grayer, MD      VITAL SIGNS:  Blood pressure (!) 171/84, pulse 76, temperature (!) 97.5 F (36.4 C), temperature source Oral, resp. rate 15, last menstrual period 09/18/2013, SpO2 99 %.  PHYSICAL EXAMINATION:  Physical Exam  GENERAL:  56 y.o.-year-old patient lying in the bed with no acute distress.  EYES: Pupils equal, round, reactive to light and accommodation. No scleral icterus. Extraocular muscles intact.  HEENT: Head atraumatic, normocephalic. Oropharynx and nasopharynx clear.  NECK:  Supple, no jugular venous distention. No thyroid  enlargement, no tenderness.  LUNGS: Normal breath sounds bilaterally, no wheezing, rales,rhonchi or crepitation. No use of accessory muscles of respiration.  CARDIOVASCULAR: S1, S2 normal. No murmurs, rubs, or gallops.  ABDOMEN: Soft, nontender, nondistended. Bowel sounds present. No organomegaly or mass.  EXTREMITIES: No pedal edema, cyanosis, or clubbing.  NEUROLOGIC: Cranial nerves II through XII are intact. Muscle strength 5/5 in all extremities. Sensation intact. Gait not checked.  PSYCHIATRIC: The patient is alert and oriented x 3.  SKIN: No obvious rash, lesion, or ulcer.  LABORATORY PANEL:   CBC Recent Labs  Lab 12/03/16 0659  WBC 11.6*  HGB 13.7  HCT 41.8  PLT 243   ------------------------------------------------------------------------------------------------------------------  Chemistries  Recent Labs  Lab 12/03/16 0812  NA 137  K 4.1  CL 93*  CO2 25  GLUCOSE 197*  BUN 57*  CREATININE 13.10*  CALCIUM 10.6*  AST 23  ALT 16  ALKPHOS 80  BILITOT 0.6   ------------------------------------------------------------------------------------------------------------------  Cardiac Enzymes Recent Labs  Lab 12/03/16 0812  TROPONINI 0.06*   ------------------------------------------------------------------------------------------------------------------  RADIOLOGY:  Dg Chest 1 View  Result Date: 12/03/2016 CLINICAL DATA:  Weakness and fall. EXAM: CHEST 1 VIEW COMPARISON:  09/08/2016 and prior exams FINDINGS: The cardiomediastinal silhouette is unremarkable. A right IJ central venous catheter is noted with tip overlying the lower SVC. There is no evidence of focal airspace disease, pulmonary edema, suspicious pulmonary nodule/mass, pleural effusion, or pneumothorax. Left apical pleuroparenchymal scarring again noted. No acute bony abnormalities are identified. IMPRESSION: No active disease. Electronically Signed   By: Margarette Canada M.D.   On: 12/03/2016 09:37   Ct  Head Wo Contrast  Result Date: 12/03/2016 CLINICAL DATA:  Fall, head injury, cervical spine trauma. EXAM: CT HEAD WITHOUT CONTRAST CT CERVICAL SPINE WITHOUT CONTRAST TECHNIQUE: Multidetector CT imaging of the head and cervical spine was performed following the standard protocol without intravenous contrast. Multiplanar CT image reconstructions of the cervical spine were also generated. COMPARISON:  Head CT dated 09/08/2016.  Brain MRI dated 09/08/2016. FINDINGS: CT HEAD FINDINGS Brain: Ventricles are within normal limits in size and configuration. Mild chronic small vessel ischemic change noted within the deep periventricular white matter regions. There is no mass, hemorrhage, edema or other evidence of acute parenchymal abnormality. No extra-axial hemorrhage. Vascular: There are chronic calcified atherosclerotic changes of the large vessels at the skull base. No unexpected hyperdense vessel. Skull: Normal. Negative for fracture or focal lesion. Sinuses/Orbits: No acute finding. Other: Soft tissue edema within the scalp overlying the midline occipital bones. No underlying fracture seen. Deformity  of the anterior nasal bones, likely chronic. CT CERVICAL SPINE FINDINGS Alignment: No evidence of acute vertebral body subluxation. Mild scoliosis. Reversal of the normal cervical lordosis is likely related to underlying degenerative change. Skull base and vertebrae: No fracture line or displaced fracture fragment identified. Facet joints appear intact and normally aligned. Soft tissues and spinal canal: No prevertebral fluid or swelling. No visible canal hematoma. Disc levels: Disc desiccations throughout the cervical spine, most pronounced at the C4-5 through C6-7 levels with associated disc space narrowings and osteophyte formation. Associated disc-osteophytic bulge at the C4-5 level is causing moderate to severe central canal stenosis. Additional disc -osteophytic bulges at the C5-6 and C6-7 levels causing moderate  degrees of central canal stenosis. Additional degenerative hypertrophy of the uncovertebral and facet joints causing severe right neural foramen stenosis at C3-4 with possible associated nerve root impingement. Upper chest: Chronic scarring/ fibrosis at the left lung apex. No acute findings. Other: Carotid atherosclerosis. IMPRESSION: 1. Soft tissue edema within the scalp overlying the midline occipital bones. No underlying fracture. 2. No acute intracranial abnormality. No intracranial mass, hemorrhage or edema. No skull fracture. 3. No fracture or acute subluxation within the cervical spine. Degenerative changes of the cervical spine, as detailed above. 4. Carotid atherosclerosis. Electronically Signed   By: Franki Cabot M.D.   On: 12/03/2016 07:56   Ct Cervical Spine Wo Contrast  Result Date: 12/03/2016 CLINICAL DATA:  Fall, head injury, cervical spine trauma. EXAM: CT HEAD WITHOUT CONTRAST CT CERVICAL SPINE WITHOUT CONTRAST TECHNIQUE: Multidetector CT imaging of the head and cervical spine was performed following the standard protocol without intravenous contrast. Multiplanar CT image reconstructions of the cervical spine were also generated. COMPARISON:  Head CT dated 09/08/2016.  Brain MRI dated 09/08/2016. FINDINGS: CT HEAD FINDINGS Brain: Ventricles are within normal limits in size and configuration. Mild chronic small vessel ischemic change noted within the deep periventricular white matter regions. There is no mass, hemorrhage, edema or other evidence of acute parenchymal abnormality. No extra-axial hemorrhage. Vascular: There are chronic calcified atherosclerotic changes of the large vessels at the skull base. No unexpected hyperdense vessel. Skull: Normal. Negative for fracture or focal lesion. Sinuses/Orbits: No acute finding. Other: Soft tissue edema within the scalp overlying the midline occipital bones. No underlying fracture seen. Deformity of the anterior nasal bones, likely chronic. CT  CERVICAL SPINE FINDINGS Alignment: No evidence of acute vertebral body subluxation. Mild scoliosis. Reversal of the normal cervical lordosis is likely related to underlying degenerative change. Skull base and vertebrae: No fracture line or displaced fracture fragment identified. Facet joints appear intact and normally aligned. Soft tissues and spinal canal: No prevertebral fluid or swelling. No visible canal hematoma. Disc levels: Disc desiccations throughout the cervical spine, most pronounced at the C4-5 through C6-7 levels with associated disc space narrowings and osteophyte formation. Associated disc-osteophytic bulge at the C4-5 level is causing moderate to severe central canal stenosis. Additional disc -osteophytic bulges at the C5-6 and C6-7 levels causing moderate degrees of central canal stenosis. Additional degenerative hypertrophy of the uncovertebral and facet joints causing severe right neural foramen stenosis at C3-4 with possible associated nerve root impingement. Upper chest: Chronic scarring/ fibrosis at the left lung apex. No acute findings. Other: Carotid atherosclerosis. IMPRESSION: 1. Soft tissue edema within the scalp overlying the midline occipital bones. No underlying fracture. 2. No acute intracranial abnormality. No intracranial mass, hemorrhage or edema. No skull fracture. 3. No fracture or acute subluxation within the cervical spine. Degenerative changes of the cervical  spine, as detailed above. 4. Carotid atherosclerosis. Electronically Signed   By: Franki Cabot M.D.   On: 12/03/2016 07:56   IMPRESSION AND PLAN:  64 Y F being admitted for syncope  * Syncope/Seizure - Neuro c/s - echo, consider EEG per Neuro input - IVFs - PT, OT c/s - off unit tele - TSH, HbA1c, Orthostatics  * ESRD on PD - Nephro c/s  * DM - continue home meds - ssi, check HbA1c  * HTN - continue Norvasc and Hydralazine - Hold Lasix  * CAD: continue asa - She is planning to switch her Cardio  (From Dr Candis Musa to Dr Ubaldo Glassing)   Also, planing to switch her PCP to [facility] starting Dec.   All the records are reviewed and case discussed with ED provider. Management plans discussed with the patient, NURSING and they are in agreement.  CODE STATUS: FULL CODE  TOTAL TIME TAKING CARE OF THIS PATIENT: 45 minutes.    Max Sane M.D on 12/03/2016 at 10:33 AM  Between 7am to 6pm - Pager - [phone]  After 6pm go to [email or web address] - Proofreader  [facility]  Office  [phone]  CC: Primary care physician; Hortencia Pilar, MD   Note: This dictation was prepared with Dragon dictation along with smaller phrase technology. Any transcriptional errors that result from this process are unintentional.

## 2016-12-03 NOTE — ED Triage Notes (Signed)
Pt bib [facility] from home following dialysis last night. EMS reports more fluid drawn off than normal. Pt states getting up and feeling weak, pt fell and hit head. Denies LOC. 1 episode vomiting in route, continued nausea. VSS. CBG 220

## 2016-12-03 NOTE — ED Provider Notes (Signed)
[facility] Emergency Department Provider Note       Time seen: ----------------------------------------- 7:15 AM on 12/03/2016 -----------------------------------------   I have reviewed the triage vital signs and the nursing notes.  HISTORY   Chief Complaint No chief complaint on file.    HPI Jacqueline Rodriguez is a 56 y.o. female with a history of end-stage renal disease on peritoneal dialysis currently who presents to the ED for a fall and possible syncope.  Husband states she was walking this morning and her legs gave way because she was weak.  She subsequently leaned against the wall at some point looks like she passed out.  Her nose was bleeding from bumping her nose on the wall.  Husband states she then passed out and convulsed briefly.  This has reportedly happened at one point in the past.  She has been on peritoneal dialysis for several weeks after being on hemodialysis.  They have increased the duration of her dialysis  Past Medical History:  Diagnosis Date  . Anemia   . Chronic bronchitis ([place])   . Chronic diastolic CHF (congestive heart failure) ([place])   . CKD (chronic kidney disease), stage III   . COPD (chronic obstructive pulmonary disease) ([place])   . Diabetes mellitus with renal complications ([place])   . Essential hypertension   . GERD (gastroesophageal reflux disease)   . Obesity   . Peripheral vascular disease ([place])   . Pulmonary hypertension ([place])   . Renal insufficiency 09/19/2015   Stage 3 CKD. Beginning dialysis.    Patient Active Problem List   Diagnosis Date Noted  . Peritoneal dialysis catheter exit site infection ([place]) 10/05/2016  . 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 Hypocalcemia 09/14/2016  . Hyperkalemia 09/13/2016  . Syncope 09/08/2016  . ESRD on dialysis ([place]) 05/25/2016  . Chronic diastolic heart failure ([place]) 07/30/2015  . Acute renal failure superimposed on stage 3 chronic kidney disease ([place]) 07/18/2015  .  Diabetes mellitus with renal complications ([place])   . CKD (chronic kidney disease), stage III ([place])   . Pulmonary hypertension ([place])   . Obesity   . Morbid obesity due to excess calories ([place])   . Anemia in chronic renal disease   . Shortness of breath   . Back pain 05/01/2015  . Essential hypertension 03/25/2015  . Diabetes ([place]) 03/25/2015    Past Surgical History:  Procedure Laterality Date  . A/V FISTULAGRAM N/A 09/16/2016   Procedure: A/V Fistulagram;  Surgeon: Algernon Huxley, MD;  Location: [place] [facility];  Service: Cardiovascular;  Laterality: N/A;  . AV FISTULA PLACEMENT Left 10/01/2015   Procedure: ARTERIOVENOUS (AV) FISTULA CREATION ( BRACHIAL CEPHALIC );  Surgeon: Algernon Huxley, MD;  Location: [facility];  Service: Vascular;  Laterality: Left;  . CAPD INSERTION N/A 06/10/2016   Procedure: LAPAROSCOPIC INSERTION CONTINUOUS AMBULATORY PERITONEAL DIALYSIS  (CAPD) CATHETER;  Surgeon: Algernon Huxley, MD;  Location: [facility];  Service: Vascular;  Laterality: N/A;  . DIALYSIS/PERMA CATHETER INSERTION  09/16/2016   Procedure: DIALYSIS/PERMA CATHETER INSERTION;  Surgeon: Algernon Huxley, MD;  Location: [place] [facility];  Service: Cardiovascular;;  . PERIPHERAL VASCULAR CATHETERIZATION N/A 07/21/2015   Procedure: Dialysis/Perma Catheter;  Surgeon: Algernon Huxley, MD;  Location: [place] [facility];  Service: Cardiovascular;  Laterality: N/A;  . PERIPHERAL VASCULAR CATHETERIZATION N/A 12/11/2015   Procedure: Dialysis/Perma Catheter Removal;  Surgeon: Algernon Huxley, MD;  Location: [place] [facility];  Service: Cardiovascular;  Laterality: N/A;  Allergies Ambien [zolpidem]  Social History Social History   Tobacco Use  . Smoking status: Former Smoker    Types: Cigarettes    Last attempt to quit: 03/24/1985    Years since quitting: 31.7  . Smokeless tobacco: Never Used  Substance Use Topics  . Alcohol use: No    Alcohol/week: 0.0 oz  . Drug use: No    Review of  Systems Constitutional: Negative for fever. Eyes: Negative for vision changes ENT: Positive for nosebleed Cardiovascular: Negative for chest pain. Respiratory: Negative for shortness of breath. Gastrointestinal: Negative for abdominal pain, vomiting and diarrhea. Musculoskeletal: Negative for back pain.  Positive for neck pain  Skin: Negative for rash. Neurological: Positive for headache  All systems negative/normal/unremarkable except as stated in the HPI  ____________________________________________   PHYSICAL EXAM:  VITAL SIGNS: ED Triage Vitals  Enc Vitals Group     BP      Pulse      Resp      Temp      Temp src      SpO2      Weight      Height      Head Circumference      Peak Flow      Pain Score      Pain Loc      Pain Edu?      Excl. in [place]?     Constitutional: Alert and oriented.  Lethargic, no distress Eyes: Conjunctivae are normal. Normal extraocular movements. ENT   Head: Normocephalic, posterior scalp contusion   Nose: No congestion/rhinnorhea.  Recent bleeding from the left naris anteriorly   Mouth/Throat: Mucous membranes are dry   Neck: No stridor. Cardiovascular: Normal rate, regular rhythm. No murmurs, rubs, or gallops. Respiratory: Normal respiratory effort without tachypnea nor retractions. Breath sounds are clear and equal bilaterally. No wheezes/rales/rhonchi. Gastrointestinal: Soft and nontender. Normal bowel sounds Musculoskeletal: Nontender with normal range of motion in extremities. No lower extremity tenderness nor edema. Neurologic:  Normal speech and language. No gross focal neurologic deficits are appreciated.  Generalized weakness, nothing focal Skin:  Skin is warm, dry and intact. No rash noted. Psychiatric: Mood and affect are normal. Speech and behavior are normal.  ____________________________________________  EKG: Interpreted by me.  Sinus rhythm the rate is 72 bpm, normal PR interval, LVH, left anterior fascicular  block  ____________________________________________  ED COURSE:  Pertinent labs & imaging results that were available during my care of the patient were reviewed by me and considered in my medical decision making (see chart for details). Patient presents for weakness, fall and syncope, we will assess with labs and imaging as indicated.   Procedures ____________________________________________   LABS (pertinent positives/negatives)  Labs Reviewed  CBC WITH DIFFERENTIAL/PLATELET - Abnormal; Notable for the following components:      Result Value   WBC 11.6 (*)    RDW 16.5 (*)    Neutro Abs 8.3 (*)    Monocytes Absolute 1.0 (*)    All other components within normal limits  COMPREHENSIVE METABOLIC PANEL - Abnormal; Notable for the following components:   Chloride 93 (*)    Glucose, Bld 197 (*)    BUN 57 (*)    Creatinine, Ser 13.10 (*)    Calcium 10.6 (*)    GFR calc non Af Amer 3 (*)    GFR calc Af Amer 3 (*)    Anion gap 19 (*)    All other components within normal limits  TROPONIN I -  Abnormal; Notable for the following components:   Troponin I 0.06 (*)    All other components within normal limits  URINALYSIS, COMPLETE (UACMP) WITH MICROSCOPIC    RADIOLOGY Images were viewed by me  CT head, C-spine  IMPRESSION: 1. Soft tissue edema within the scalp overlying the midline occipital bones. No underlying fracture. 2. No acute intracranial abnormality. No intracranial mass, hemorrhage or edema. No skull fracture. 3. No fracture or acute subluxation within the cervical spine. Degenerative changes of the cervical spine, as detailed above. 4. Carotid atherosclerosis. ____________________________________________  DIFFERENTIAL DIAGNOSIS   Dehydration, electrolyte abnormality, osmotic shift, end-stage renal disease, occult infection, medication side effect  FINAL ASSESSMENT AND PLAN  Syncope, head injury, end-stage renal disease on peritoneal dialysis   Plan: Patient  had presented for syncope with fall and head injury. Patient's labs are reflective of her underlying end-stage renal disease. Patient's imaging did not reveal any acute process.  I think the reason for the syncope is likely related to the third bag of peritoneal dialysis that has been added.  I will recommend observation with nephrology consultation.   Earleen Newport, MD   Note: This note was generated in part or whole with voice recognition software. Voice recognition is usually quite accurate but there are transcription errors that can and very often do occur. I apologize for any typographical errors that were not detected and corrected.     Earleen Newport, MD 12/03/16 [phone]

## 2016-12-03 NOTE — Progress Notes (Signed)
Consult received.  We will hold off on peritoneal dialysis for now given the fact that there is some concern for dehydration.  We will fully evaluate the patient tomorrow and determine if she needs ongoing peritoneal dialysis.

## 2016-12-03 NOTE — Evaluation (Signed)
Physical Therapy Evaluation Patient Details Name: RAILEY GLAD MRN: [id number] DOB: 1960-07-22 Today's Date: 12/03/2016   History of Present Illness  presented to ER and admitted under observation after syncopal episode, fall in home environment.   Clinical Impression  Upon evaluation, patient alert and oriented; follows all commands and demonstrates fair insight into situation.  Bilat UE/LE strength and ROM grossly symmetrical and WFL; no focal weakness or sensory deficit (beyond baseline neuropathy) noted.  Demonstrates ability to complete bed mobility with mod indep; sit/stand, basic transfers with RW, cga.  Requires cuing for hand placement and overall safety (patient less than enthused with use of RW).  Additional mobility/gait efforts deferred due to symptomatic orthostasis (see vitals flowsheet) with transition to upright (recovers with return to sitting and supine).  RN informed/aware.  Will continue mobility assessment and progression in subsequent session as medically appropriate. Would benefit from skilled PT to address above deficits and promote optimal return to PLOF; Recommend transition to [place] upon discharge from acute hospitalization.     Follow Up Recommendations Home health PT    Equipment Recommendations  Rolling walker with 5" wheels    Recommendations for Other Services       Precautions / Restrictions Precautions Precautions: Fall Precaution Comments: L AVF, R permcath and Tenckoff catheter Restrictions Weight Bearing Restrictions: No      Mobility  Bed Mobility Overal bed mobility: Modified Independent Bed Mobility: Supine to Sit;Sit to Supine              Transfers Overall transfer level: Needs assistance Equipment used: Rolling walker (2 wheeled) Transfers: Sit to/from Stand Sit to Stand: Min guard         General transfer comment: cuing for hand placement  Ambulation/Gait             General Gait Details: deferred due to noted  orthostasis with transition to upright; will continue assessment/progression in subsequent sessions.  Stairs            Wheelchair Mobility    Modified Rankin (Stroke Patients Only)       Balance Overall balance assessment: Needs assistance Sitting-balance support: No upper extremity supported;Feet supported Sitting balance-Leahy Scale: Good     Standing balance support: Bilateral upper extremity supported Standing balance-Leahy Scale: Fair                               Pertinent Vitals/Pain Pain Assessment: No/denies pain    Home Living Family/patient expects to be discharged to:: Private residence Living Arrangements: Spouse/significant other Available Help at Discharge: Family;Available PRN/intermittently Type of Home: House Home Access: Ramped entrance     Home Layout: One level Home Equipment: Walker - 2 wheels;Wheelchair - manual      Prior Function Level of Independence: Independent         Comments: Pt is independent with all ADLs at baseline.  She ambulates household distances without AD, but reports 3 falls in previous six months.  Refuses use of assist device even if needed.     Hand Dominance        Extremity/Trunk Assessment   Upper Extremity Assessment Upper Extremity Assessment: Overall WFL for tasks assessed    Lower Extremity Assessment Lower Extremity Assessment: Overall WFL for tasks assessed(grossly 4-/5 throughout; baseline neuropathy mid-calf distally, unchanged with this admission)       Communication   Communication: No difficulties  Cognition Arousal/Alertness: Awake/alert Behavior During Therapy: WFL for tasks  assessed/performed Overall Cognitive Status: Within Functional Limits for tasks assessed                                        General Comments      Exercises     Assessment/Plan    PT Assessment Patient needs continued PT services  PT Problem List Decreased  strength;Decreased activity tolerance;Decreased balance;Decreased mobility;Cardiopulmonary status limiting activity       PT Treatment Interventions DME instruction;Gait training;Functional mobility training;Therapeutic activities;Therapeutic exercise;Balance training;Patient/family education    PT Goals (Current goals can be found in the Care Plan section)  Acute Rehab PT Goals Patient Stated Goal: to return home; not use a cane or walker PT Goal Formulation: With patient Time For Goal Achievement: 12/17/16 Potential to Achieve Goals: Good    Frequency Min 2X/week   Barriers to discharge        Co-evaluation               AM-PAC PT "6 Clicks" Daily Activity  Outcome Measure Difficulty turning over in bed (including adjusting bedclothes, sheets and blankets)?: None Difficulty moving from lying on back to sitting on the side of the bed? : None Difficulty sitting down on and standing up from a chair with arms (e.g., wheelchair, bedside commode, etc,.)?: Unable Help needed moving to and from a bed to chair (including a wheelchair)?: A Little Help needed walking in hospital room?: A Little Help needed climbing 3-5 steps with a railing? : A Little 6 Click Score: 18    End of Session Equipment Utilized During Treatment: Gait belt Activity Tolerance: Treatment limited secondary to medical complications (Comment)(symtomatic orthostasis) Patient left: in bed;with call bell/phone within reach;with bed alarm set Nurse Communication: Mobility status PT Visit Diagnosis: Difficulty in walking, not elsewhere classified (R26.2);Unsteadiness on feet (R26.81);Muscle weakness (generalized) (M62.81)    Time: 1451-1520 PT Time Calculation (min) (ACUTE ONLY): 29 min   Charges:   PT Evaluation $PT Eval Moderate Complexity: 1 Mod PT Treatments $Therapeutic Activity: 8-22 mins   PT G Codes:   PT G-Codes **NOT FOR INPATIENT CLASS** Functional Assessment Tool Used: AM-PAC 6 Clicks Basic  Mobility Functional Limitation: Mobility: Walking and moving around Mobility: Walking and Moving Around Current Status (W4315): At least 20 percent but less than 40 percent impaired, limited or restricted Mobility: Walking and Moving Around Goal Status [phone]): At least 1 percent but less than 20 percent impaired, limited or restricted   Sheriann Newmann H. Owens Shark, Caroleen, DPT, NCS 12/03/16, 4:57 PM [phone]

## 2016-12-04 ENCOUNTER — Other Ambulatory Visit: Payer: Self-pay

## 2016-12-04 DIAGNOSIS — D631 Anemia in chronic kidney disease: Secondary | ICD-10-CM | POA: Diagnosis not present

## 2016-12-04 DIAGNOSIS — R55 Syncope and collapse: Secondary | ICD-10-CM

## 2016-12-04 DIAGNOSIS — Z992 Dependence on renal dialysis: Secondary | ICD-10-CM | POA: Diagnosis not present

## 2016-12-04 DIAGNOSIS — N186 End stage renal disease: Secondary | ICD-10-CM | POA: Diagnosis not present

## 2016-12-04 DIAGNOSIS — E119 Type 2 diabetes mellitus without complications: Secondary | ICD-10-CM | POA: Diagnosis not present

## 2016-12-04 DIAGNOSIS — D509 Iron deficiency anemia, unspecified: Secondary | ICD-10-CM | POA: Diagnosis not present

## 2016-12-04 DIAGNOSIS — I10 Essential (primary) hypertension: Secondary | ICD-10-CM | POA: Diagnosis not present

## 2016-12-04 DIAGNOSIS — I951 Orthostatic hypotension: Secondary | ICD-10-CM | POA: Diagnosis not present

## 2016-12-04 LAB — BASIC METABOLIC PANEL
ANION GAP: 14 (ref 5–15)
BUN: 66 mg/dL — ABNORMAL HIGH (ref 6–20)
CALCIUM: 9.8 mg/dL (ref 8.9–10.3)
CO2: 26 mmol/L (ref 22–32)
Chloride: 92 mmol/L — ABNORMAL LOW (ref 101–111)
Creatinine, Ser: 13.4 mg/dL — ABNORMAL HIGH (ref 0.44–1.00)
GFR, EST AFRICAN AMERICAN: 3 mL/min — AB (ref >60)
GFR, EST NON AFRICAN AMERICAN: 3 mL/min — AB (ref >60)
Glucose, Bld: 68 mg/dL (ref 65–99)
Potassium: 4.6 mmol/L (ref 3.5–5.1)
Sodium: 132 mmol/L — ABNORMAL LOW (ref 135–145)

## 2016-12-04 LAB — HEMOGLOBIN A1C
Hgb A1c MFr Bld: 6 % — ABNORMAL HIGH (ref 4.8–5.6)
MEAN PLASMA GLUCOSE: 126 mg/dL

## 2016-12-04 LAB — CBC
HCT: 35.6 % (ref 35.0–47.0)
HEMOGLOBIN: 12 g/dL (ref 12.0–16.0)
MCH: 31.4 pg (ref 26.0–34.0)
MCHC: 33.8 g/dL (ref 32.0–36.0)
MCV: 92.8 fL (ref 80.0–100.0)
Platelets: 191 10*3/uL (ref 150–440)
RBC: 3.83 MIL/uL (ref 3.80–5.20)
RDW: 16.6 % — ABNORMAL HIGH (ref 11.5–14.5)
WBC: 10.3 10*3/uL (ref 3.6–11.0)

## 2016-12-04 LAB — GLUCOSE, CAPILLARY: GLUCOSE-CAPILLARY: 78 mg/dL (ref 65–99)

## 2016-12-04 MED ORDER — HYDRALAZINE HCL 10 MG PO TABS
10.0000 mg | ORAL_TABLET | Freq: Three times a day (TID) | ORAL | Status: DC
  Administered 2016-12-04: 10 mg via ORAL
  Filled 2016-12-04 (×3): qty 1

## 2016-12-04 MED ORDER — HYDRALAZINE HCL 10 MG PO TABS: 10.0000 mg | ORAL_TABLET | Freq: Three times a day (TID) | ORAL | 0 refills | Status: DC

## 2016-12-04 MED ORDER — GABAPENTIN 600 MG PO TABS
300.0000 mg | ORAL_TABLET | Freq: Every morning | ORAL | Status: DC
  Administered 2016-12-04: 300 mg via ORAL
  Filled 2016-12-04: qty 1

## 2016-12-04 NOTE — Progress Notes (Signed)
Jacqueline Rodriguez  A and O x 4. VSS. Pt tolerating diet well. No complaints of pain or nausea. IV removed intact, prescriptions given. Pt voiced understanding of discharge instructions with no further questions. Pt discharged via wheelchair with RN. Home health to send walker to house.  Lynann Bologna MSN, RN-BC  Allergies as of 12/04/2016      Reactions   Ambien [zolpidem] Other (See Comments)   hallucinations      Medication List    STOP taking these medications   amLODipine 5 MG tablet Commonly known as:  NORVASC     TAKE these medications   acetaminophen 500 MG tablet Commonly known as:  TYLENOL Take 500 mg by mouth every 6 (six) hours as needed.   albuterol 108 (90 Base) MCG/ACT inhaler Commonly known as:  PROVENTIL HFA;VENTOLIN HFA Inhale 2 puffs into the lungs every 6 (six) hours as needed for wheezing or shortness of breath.   aspirin 81 MG EC tablet Take 1 tablet (81 mg total) by mouth daily. What changed:    how much to take  when to take this   azithromycin 250 MG tablet Commonly known as:  ZITHROMAX One tablet daily as per DR Ola Spurr infectious disease   CALCIUM 1200+D3 PO Take 1 tablet by mouth daily.   calcium acetate 667 MG capsule Commonly known as:  PHOSLO Take 3 capsules (2,001 mg total) by mouth 3 (three) times daily with meals.   cetirizine 10 MG tablet Commonly known as:  ZYRTEC Take 10 mg by mouth daily as needed for allergies.   ferrous sulfate 325 (65 FE) MG EC tablet Take 1 tablet (325 mg total) by mouth 2 (two) times daily.   furosemide 40 MG tablet Commonly known as:  LASIX Take 1 tablet (40 mg total) by mouth daily.   gabapentin 100 MG capsule Commonly known as:  NEURONTIN Take 100 mg by mouth 3 (three) times daily as needed. For neuropathy.   gentamicin cream 0.1 % Commonly known as:  GARAMYCIN Apply 1 application topically daily.   GLUCOSAMINE-CHONDROITIN PO Take 4,000 mg by mouth daily.   hydrALAZINE 10 MG tablet Commonly  known as:  APRESOLINE Take 1 tablet (10 mg total) by mouth 3 (three) times daily. What changed:    medication strength  how much to take   HYDROcodone-acetaminophen 5-325 MG tablet Commonly known as:  NORCO Take 1 tablet by mouth every 6 (six) hours as needed for moderate pain.   isosorbide mononitrate 30 MG 24 hr tablet Commonly known as:  IMDUR Take 30 mg by mouth daily.   magnesium oxide 400 (241.3 Mg) MG tablet Commonly known as:  MAG-OX Take 1 tablet (400 mg total) by mouth daily.   meclizine 25 MG tablet Commonly known as:  ANTIVERT Take 1 tablet (25 mg total) by mouth 3 (three) times daily as needed for dizziness.   ondansetron 4 MG tablet Commonly known as:  ZOFRAN Take 1 tablet (4 mg total) by mouth every 6 (six) hours as needed for nausea.   tobramycin 160 mg in dextrose 5 % 50 mL Inject 160 mg into the vein every Monday, Wednesday, and Friday at 6 PM.            Durable Medical Equipment  (From admission, onward)        Start     Ordered   12/04/16 1301  For home use only DME Walker rolling  Once    Question:  Patient needs a walker to treat  with the following condition  Answer:  Seizures ([place])   12/04/16 1306   12/04/16 1300  For home use only DME Walker rolling  Once    Question:  Patient needs a walker to treat with the following condition  Answer:  At risk for injury associated with seizures   12/04/16 1306      Vitals:   12/04/16 0458 12/04/16 1215  BP: (!) 166/63 (!) 150/81  Pulse: 83 79  Resp: 17   Temp: 98.1 F (36.7 C)   SpO2: 97% 100%

## 2016-12-04 NOTE — Care Management Note (Signed)
Case Management Note  Patient Details  Name: BRENYA TAULBEE MRN: [id number] Date of Birth: June 29, 1960  Subjective/Objective:  Discussed discharge planning with Dr Molli Hazard and Ms Idolina Primer. Per her choice, a referral for HH=PT, Aide, was called to [place] [facility]. [facility] will also deliver a RW to Ms Canny in her [facility] room 206 today.                  Action/Plan:   Expected Discharge Date:  12/04/16               Expected Discharge Plan:  [place]  In-House Referral:     Discharge planning Services  CM Consult  Post Acute Care Choice:    Choice offered to:  Patient  DME Arranged:  Walker rolling DME Agency:  [place] Arranged:  PT, Nurse's Aide [place] Agency:  [place]  Status of Service:  Completed, signed off  If discussed at [place] of Stay Meetings, dates discussed:    Additional Comments:  Lillian Tigges A, RN 12/04/2016, 1:09 PM

## 2016-12-04 NOTE — Progress Notes (Signed)
[facility]  ROUNDING NOTE   Subjective:  Patient well-known to Jacqueline Rodriguez. We follow her for outpatient peritoneal dialysis. Recently she has had some trouble passing dialysis adequacy. Recently she has been using higher dextrose solution dialysate. She experienced a syncopal event. Patient found to be orthostatic here.  Objective:  Vital signs in last 24 hours:  Temp:  [97.5 F (36.4 C)-98.5 F (36.9 C)] 98.1 F (36.7 C) (11/24 0458) Pulse Rate:  [75-85] 83 (11/24 0458) Resp:  [17-19] 17 (11/24 0458) BP: (153-167)/(49-66) 166/63 (11/24 0458) SpO2:  [96 %-100 %] 97 % (11/24 0458) Weight:  [91.3 kg (201 lb 4.8 oz)-94.8 kg (208 lb 14.4 oz)] 94.8 kg (208 lb 14.4 oz) (11/24 0500)  Weight change:  Filed Weights   12/03/16 1148 12/04/16 0500  Weight: 91.3 kg (201 lb 4.8 oz) 94.8 kg (208 lb 14.4 oz)    Intake/Output: I/O last 3 completed shifts: In: 382 [P.O.:240; I.V.:393] Out: 100 [Urine:100]   Intake/Output this shift:  Total I/O In: 360 [P.O.:360] Out: 0   Physical Exam: General: No acute distress  Head: Normocephalic, atraumatic. Moist oral mucosal membranes  Eyes: Anicteric  Neck: Supple, trachea midline  Lungs:  Clear to auscultation, normal effort  Heart: S1S2 no rubs  Abdomen:  Soft, nontender, bowel sounds present  Extremities: No peripheral edema.  Neurologic: Awake, alert, following commands  Skin: No lesions  Access: PD catheter in place    Basic Metabolic Panel: Recent Labs  Lab 12/03/16 0812 12/04/16 0532  NA 137 132*  K 4.1 4.6  CL 93* 92*  CO2 25 26  GLUCOSE 197* 68  BUN 57* 66*  CREATININE 13.10* 13.40*  CALCIUM 10.6* 9.8    Liver Function Tests: Recent Labs  Lab 12/03/16 0812  AST 23  ALT 16  ALKPHOS 80  BILITOT 0.6  PROT 7.8  ALBUMIN 3.7   No results for input(s): LIPASE, AMYLASE in the last 168 hours. No results for input(s): AMMONIA in the last 168 hours.  CBC: Recent Labs  Lab 12/03/16 0659 12/04/16 0532   WBC 11.6* 10.3  NEUTROABS 8.3*  --   HGB 13.7 12.0  HCT 41.8 35.6  MCV 92.6 92.8  PLT 243 191    Cardiac Enzymes: Recent Labs  Lab 12/03/16 0812  TROPONINI 0.06*    BNP: Invalid input(s): POCBNP  CBG: Recent Labs  Lab 12/04/16 5053  ZJQBHA 19    Microbiology: Results for orders placed or performed during the hospital encounter of 09/08/16  Urine culture     Status: Abnormal   Collection Time: 09/08/16 12:27 PM  Result Value Ref Range Status   Specimen Description URINE, RANDOM  Final   Special Requests NONE  Final   Culture MULTIPLE SPECIES PRESENT, SUGGEST RECOLLECTION (A)  Final   Report Status 09/10/2016 FINAL  Final  MRSA PCR Screening     Status: None   Collection Time: 09/08/16  6:05 PM  Result Value Ref Range Status   MRSA by PCR NEGATIVE NEGATIVE Final    Comment:        The GeneXpert MRSA Assay (FDA approved for NASAL specimens only), is one component of a comprehensive MRSA colonization surveillance program. It is not intended to diagnose MRSA infection nor to guide or monitor treatment for MRSA infections.     Coagulation Studies: No results for input(s): LABPROT, INR in the last 72 hours.  Urinalysis: Recent Labs    12/03/16 1059  COLORURINE YELLOW*  LABSPEC 1.016  PHURINE 5.0  GLUCOSEU >=500*  HGBUR SMALL*  BILIRUBINUR NEGATIVE  KETONESUR NEGATIVE  PROTEINUR >=300*  NITRITE NEGATIVE  LEUKOCYTESUR TRACE*      Imaging: Dg Chest 1 View  Result Date: 12/03/2016 CLINICAL DATA:  Weakness and fall. EXAM: CHEST 1 VIEW COMPARISON:  09/08/2016 and prior exams FINDINGS: The cardiomediastinal silhouette is unremarkable. A right IJ central venous catheter is noted with tip overlying the lower SVC. There is no evidence of focal airspace disease, pulmonary edema, suspicious pulmonary nodule/mass, pleural effusion, or pneumothorax. Left apical pleuroparenchymal scarring again noted. No acute bony abnormalities are identified. IMPRESSION: No  active disease. Electronically Signed   By: Margarette Canada M.D.   On: 12/03/2016 09:37   Ct Head Wo Contrast  Result Date: 12/03/2016 CLINICAL DATA:  Fall, head injury, cervical spine trauma. EXAM: CT HEAD WITHOUT CONTRAST CT CERVICAL SPINE WITHOUT CONTRAST TECHNIQUE: Multidetector CT imaging of the head and cervical spine was performed following the standard protocol without intravenous contrast. Multiplanar CT image reconstructions of the cervical spine were also generated. COMPARISON:  Head CT dated 09/08/2016.  Brain MRI dated 09/08/2016. FINDINGS: CT HEAD FINDINGS Brain: Ventricles are within normal limits in size and configuration. Mild chronic small vessel ischemic change noted within the deep periventricular white matter regions. There is no mass, hemorrhage, edema or other evidence of acute parenchymal abnormality. No extra-axial hemorrhage. Vascular: There are chronic calcified atherosclerotic changes of the large vessels at the skull base. No unexpected hyperdense vessel. Skull: Normal. Negative for fracture or focal lesion. Sinuses/Orbits: No acute finding. Other: Soft tissue edema within the scalp overlying the midline occipital bones. No underlying fracture seen. Deformity of the anterior nasal bones, likely chronic. CT CERVICAL SPINE FINDINGS Alignment: No evidence of acute vertebral body subluxation. Mild scoliosis. Reversal of the normal cervical lordosis is likely related to underlying degenerative change. Skull base and vertebrae: No fracture line or displaced fracture fragment identified. Facet joints appear intact and normally aligned. Soft tissues and spinal canal: No prevertebral fluid or swelling. No visible canal hematoma. Disc levels: Disc desiccations throughout the cervical spine, most pronounced at the C4-5 through C6-7 levels with associated disc space narrowings and osteophyte formation. Associated disc-osteophytic bulge at the C4-5 level is causing moderate to severe central canal  stenosis. Additional disc -osteophytic bulges at the C5-6 and C6-7 levels causing moderate degrees of central canal stenosis. Additional degenerative hypertrophy of the uncovertebral and facet joints causing severe right neural foramen stenosis at C3-4 with possible associated nerve root impingement. Upper chest: Chronic scarring/ fibrosis at the left lung apex. No acute findings. Other: Carotid atherosclerosis. IMPRESSION: 1. Soft tissue edema within the scalp overlying the midline occipital bones. No underlying fracture. 2. No acute intracranial abnormality. No intracranial mass, hemorrhage or edema. No skull fracture. 3. No fracture or acute subluxation within the cervical spine. Degenerative changes of the cervical spine, as detailed above. 4. Carotid atherosclerosis. Electronically Signed   By: Franki Cabot M.D.   On: 12/03/2016 07:56   Ct Cervical Spine Wo Contrast  Result Date: 12/03/2016 CLINICAL DATA:  Fall, head injury, cervical spine trauma. EXAM: CT HEAD WITHOUT CONTRAST CT CERVICAL SPINE WITHOUT CONTRAST TECHNIQUE: Multidetector CT imaging of the head and cervical spine was performed following the standard protocol without intravenous contrast. Multiplanar CT image reconstructions of the cervical spine were also generated. COMPARISON:  Head CT dated 09/08/2016.  Brain MRI dated 09/08/2016. FINDINGS: CT HEAD FINDINGS Brain: Ventricles are within normal limits in size and configuration. Mild chronic small vessel ischemic change  noted within the deep periventricular white matter regions. There is no mass, hemorrhage, edema or other evidence of acute parenchymal abnormality. No extra-axial hemorrhage. Vascular: There are chronic calcified atherosclerotic changes of the large vessels at the skull base. No unexpected hyperdense vessel. Skull: Normal. Negative for fracture or focal lesion. Sinuses/Orbits: No acute finding. Other: Soft tissue edema within the scalp overlying the midline occipital bones.  No underlying fracture seen. Deformity of the anterior nasal bones, likely chronic. CT CERVICAL SPINE FINDINGS Alignment: No evidence of acute vertebral body subluxation. Mild scoliosis. Reversal of the normal cervical lordosis is likely related to underlying degenerative change. Skull base and vertebrae: No fracture line or displaced fracture fragment identified. Facet joints appear intact and normally aligned. Soft tissues and spinal canal: No prevertebral fluid or swelling. No visible canal hematoma. Disc levels: Disc desiccations throughout the cervical spine, most pronounced at the C4-5 through C6-7 levels with associated disc space narrowings and osteophyte formation. Associated disc-osteophytic bulge at the C4-5 level is causing moderate to severe central canal stenosis. Additional disc -osteophytic bulges at the C5-6 and C6-7 levels causing moderate degrees of central canal stenosis. Additional degenerative hypertrophy of the uncovertebral and facet joints causing severe right neural foramen stenosis at C3-4 with possible associated nerve root impingement. Upper chest: Chronic scarring/ fibrosis at the left lung apex. No acute findings. Other: Carotid atherosclerosis. IMPRESSION: 1. Soft tissue edema within the scalp overlying the midline occipital bones. No underlying fracture. 2. No acute intracranial abnormality. No intracranial mass, hemorrhage or edema. No skull fracture. 3. No fracture or acute subluxation within the cervical spine. Degenerative changes of the cervical spine, as detailed above. 4. Carotid atherosclerosis. Electronically Signed   By: Franki Cabot M.D.   On: 12/03/2016 07:56     Medications:   . sodium chloride 75 mL/hr at 12/03/16 1237   . aspirin EC  81 mg Oral Daily  . calcium acetate  2,001 mg Oral TID WC  . ferrous sulfate  325 mg Oral BID  . gabapentin  300 mg Oral q morning - 10a  . heparin  5,000 Units Subcutaneous Q8H  . hydrALAZINE  10 mg Oral TID  . isosorbide  mononitrate  30 mg Oral Daily  . loratadine  10 mg Oral Daily  . magnesium oxide  400 mg Oral Daily  . sodium chloride flush  3 mL Intravenous Q12H   acetaminophen **OR** acetaminophen, albuterol, alum & mag hydroxide-simeth, bisacodyl, ibuprofen, ondansetron, senna-docusate  Assessment/ Plan:  56 y.o. female with diastolic congestive heart failure, hypertension, diabetes mellitus type II, diabetic retinopathy, diabetic neuropathy, ESRD on PD, anemia of CKD, SHPTH admitted now with syncope.   [facility]  1.  Syncope.  2.  Orthostatic hypotension. 3.  Diabetes mellitus type 2  Plan:  Patient well-known to Jacqueline Rodriguez as an outpatient.  Recently she has had some difficulties passing adequacy.  Therefore her prescription was changed to enhance ultrafiltration which would hopefully thereby allow her to pass adequacy.  However it appears that the patient became dehydrated in the process.  She was using higher dextrose solutions before peritoneal dialysis.  I have asked her to use 1.5% dextrose solution for the next several days.  She verbalized understanding of this.  She was found to be orthostatic on vitals.  Encourage patient to increase her fluid intake to 40 ounces per day for a few days and then to go back down to 32 ounces per day.  Otherwise disposition as per hospitalist.   LOS: 0  Tajia Szeliga 11/24/201810:48 AM

## 2016-12-04 NOTE — Progress Notes (Signed)
Notified Dr. Molli Hazard that patient did not receive fluids overnight and does not have IV access. Per MD check orthostatic vitals if BP does not drop okay for patient to have no IV access. MD also to place order for gabapentin at request of patient.

## 2016-12-04 NOTE — Consult Note (Signed)
Reason for Consult:syncope  Referring Physician: Dr. Anselm Jungling   CC: syncope  HPI: Jacqueline Rodriguez is an 56 y.o. female  with diastolic congestive heart failure, hypertension, diabetes mellitus type II, diabetic retinopathy, diabetic neuropathy, ESRD on PD, anemia of CKD, SHPTH admitted now with syncope. Pt states she was walking and felt diaphoretic and fell prior to event. Currently back to baseline.  No hx of seizures.  CTH no acute intracranial pathology.     Past Medical History:  Diagnosis Date  . Anemia   . Chronic bronchitis ([place])   . Chronic diastolic CHF (congestive heart failure) ([place])   . CKD (chronic kidney disease), stage III ([place])   . COPD (chronic obstructive pulmonary disease) ([place])   . Diabetes mellitus with renal complications ([place])   . Essential hypertension   . GERD (gastroesophageal reflux disease)   . Obesity   . Peripheral vascular disease ([place])   . Pulmonary hypertension ([place])   . Renal insufficiency 09/19/2015   Stage 3 CKD. Beginning dialysis.    Past Surgical History:  Procedure Laterality Date  . A/V FISTULAGRAM N/A 09/16/2016   Procedure: A/V Fistulagram;  Surgeon: Algernon Huxley, MD;  Location: [facility];  Service: Cardiovascular;  Laterality: N/A;  . AV FISTULA PLACEMENT Left 10/01/2015   Procedure: ARTERIOVENOUS (AV) FISTULA CREATION ( BRACHIAL CEPHALIC );  Surgeon: Algernon Huxley, MD;  Location: [facility];  Service: Vascular;  Laterality: Left;  . CAPD INSERTION N/A 06/10/2016   Procedure: LAPAROSCOPIC INSERTION CONTINUOUS AMBULATORY PERITONEAL DIALYSIS  (CAPD) CATHETER;  Surgeon: Algernon Huxley, MD;  Location: [facility];  Service: Vascular;  Laterality: N/A;  . DIALYSIS/PERMA CATHETER INSERTION  09/16/2016   Procedure: DIALYSIS/PERMA CATHETER INSERTION;  Surgeon: Algernon Huxley, MD;  Location: [facility];  Service: Cardiovascular;;  . PERIPHERAL VASCULAR CATHETERIZATION N/A 07/21/2015   Procedure: Dialysis/Perma Catheter;  Surgeon: Algernon Huxley,  MD;  Location: [facility];  Service: Cardiovascular;  Laterality: N/A;  . PERIPHERAL VASCULAR CATHETERIZATION N/A 12/11/2015   Procedure: Dialysis/Perma Catheter Removal;  Surgeon: Algernon Huxley, MD;  Location: [place];  Service: Cardiovascular;  Laterality: N/A;    Family History  Problem Relation Age of Onset  . Hypertension Mother   . Diabetes Mellitus II Mother   . CAD Father   . Hypertension Father   . Heart attack Father     Social History:  reports that she quit smoking about 31 years ago. Her smoking use included cigarettes. she has never used smokeless tobacco. She reports that she does not drink alcohol or use drugs.  Allergies  Allergen Reactions  . Ambien [Zolpidem] Other (See Comments)    hallucinations    Medications: I have reviewed the patient's current medications.  ROS: History obtained from the patient  General ROS: negative for - chills, fatigue, fever, night sweats, weight gain or weight loss Psychological ROS: negative for - behavioral disorder, hallucinations, memory difficulties, mood swings or suicidal ideation Ophthalmic ROS: negative for - blurry vision, double vision, eye pain or loss of vision ENT ROS: negative for - epistaxis, nasal discharge, oral lesions, sore throat, tinnitus or vertigo Allergy and Immunology ROS: negative for - hives or itchy/watery eyes Hematological and Lymphatic ROS: negative for - bleeding problems, bruising or swollen lymph nodes Endocrine ROS: negative for - galactorrhea, hair pattern changes, polydipsia/polyuria or temperature intolerance Respiratory ROS: negative for - cough, hemoptysis, shortness of breath or wheezing Cardiovascular ROS: negative for - chest pain,  dyspnea on exertion, edema or irregular heartbeat Gastrointestinal ROS: negative for - abdominal pain, diarrhea, hematemesis, nausea/vomiting or stool incontinence Genito-Urinary ROS: negative for - dysuria, hematuria, incontinence or  urinary frequency/urgency Musculoskeletal ROS: negative for - joint swelling or muscular weakness Neurological ROS: as noted in HPI Dermatological ROS: negative for rash and skin lesion changes  Physical Examination: Blood pressure (!) 150/81, pulse 79, temperature 98.1 F (36.7 C), temperature source Oral, resp. rate 17, height 5\' 9"  (1.753 m), weight 94.8 kg (208 lb 14.4 oz), last menstrual period 09/18/2013, SpO2 100 %.   Neurological Examination   Mental Status: Alert, oriented, thought content appropriate.  Speech fluent without evidence of aphasia.  Able to follow 3 step commands without difficulty. Cranial Nerves: II: Discs flat bilaterally; Visual fields grossly normal, pupils equal, round, reactive to light and accommodation III,IV, VI: ptosis not present, extra-ocular motions intact bilaterally V,VII: smile symmetric, facial light touch sensation normal bilaterally VIII: hearing normal bilaterally IX,X: gag reflex present XI: bilateral shoulder shrug XII: midline tongue extension Motor: Right : Upper extremity   5/5    Left:     Upper extremity   5/5  Lower extremity   5/5     Lower extremity   5/5 Tone and bulk:normal tone throughout; no atrophy noted Sensory: decreased sensation in LE consistent with diabetic neuropathy.   Deep Tendon Reflexes: 1+ and symmetric throughout Plantars: Right: downgoing   Left: downgoing Cerebellar: normal finger-to-nose, normal rapid alternating movements and normal heel-to-shin test Gait: normal gait and station      Laboratory Studies:   Basic Metabolic Panel: Recent Labs  Lab 12/03/16 0812 12/04/16 0532  NA 137 132*  K 4.1 4.6  CL 93* 92*  CO2 25 26  GLUCOSE 197* 68  BUN 57* 66*  CREATININE 13.10* 13.40*  CALCIUM 10.6* 9.8    Liver Function Tests: Recent Labs  Lab 12/03/16 0812  AST 23  ALT 16  ALKPHOS 80  BILITOT 0.6  PROT 7.8  ALBUMIN 3.7   No results for input(s): LIPASE, AMYLASE in the last 168 hours. No  results for input(s): AMMONIA in the last 168 hours.  CBC: Recent Labs  Lab 12/03/16 0659 12/04/16 0532  WBC 11.6* 10.3  NEUTROABS 8.3*  --   HGB 13.7 12.0  HCT 41.8 35.6  MCV 92.6 92.8  PLT 243 191    Cardiac Enzymes: Recent Labs  Lab 12/03/16 0812  TROPONINI 0.06*    BNP: Invalid input(s): POCBNP  CBG: Recent Labs  Lab 12/04/16 1191  YNWGNF 62    Microbiology: Results for orders placed or performed during the hospital encounter of 09/08/16  Urine culture     Status: Abnormal   Collection Time: 09/08/16 12:27 PM  Result Value Ref Range Status   Specimen Description URINE, RANDOM  Final   Special Requests NONE  Final   Culture MULTIPLE SPECIES PRESENT, SUGGEST RECOLLECTION (A)  Final   Report Status 09/10/2016 FINAL  Final  MRSA PCR Screening     Status: None   Collection Time: 09/08/16  6:05 PM  Result Value Ref Range Status   MRSA by PCR NEGATIVE NEGATIVE Final    Comment:        The GeneXpert MRSA Assay (FDA approved for NASAL specimens only), is one component of a comprehensive MRSA colonization surveillance program. It is not intended to diagnose MRSA infection nor to guide or monitor treatment for MRSA infections.     Coagulation Studies: No results for input(s): LABPROT, INR  in the last 72 hours.  Urinalysis:  Recent Labs  Lab 12/03/16 1059  COLORURINE YELLOW*  LABSPEC 1.016  PHURINE 5.0  GLUCOSEU >=500*  HGBUR SMALL*  BILIRUBINUR NEGATIVE  KETONESUR NEGATIVE  PROTEINUR >=300*  NITRITE NEGATIVE  LEUKOCYTESUR TRACE*    Lipid Panel:     Component Value Date/Time   CHOL 152 09/08/2016 1648   TRIG 85 09/08/2016 1648   HDL 46 09/08/2016 1648   CHOLHDL 3.3 09/08/2016 1648   VLDL 17 09/08/2016 1648   LDLCALC 89 09/08/2016 1648    HgbA1C:  Lab Results  Component Value Date   HGBA1C 6.0 (H) 12/03/2016    Urine Drug Screen:  No results found for: LABOPIA, COCAINSCRNUR, LABBENZ, AMPHETMU, THCU, LABBARB  Alcohol Level: No  results for input(s): ETH in the last 168 hours.  Other results: EKG: normal EKG, normal sinus rhythm, unchanged from previous tracings.  Imaging: Dg Chest 1 View  Result Date: 12/03/2016 CLINICAL DATA:  Weakness and fall. EXAM: CHEST 1 VIEW COMPARISON:  09/08/2016 and prior exams FINDINGS: The cardiomediastinal silhouette is unremarkable. A right IJ central venous catheter is noted with tip overlying the lower SVC. There is no evidence of focal airspace disease, pulmonary edema, suspicious pulmonary nodule/mass, pleural effusion, or pneumothorax. Left apical pleuroparenchymal scarring again noted. No acute bony abnormalities are identified. IMPRESSION: No active disease. Electronically Signed   By: Margarette Canada M.D.   On: 12/03/2016 09:37   Ct Head Wo Contrast  Result Date: 12/03/2016 CLINICAL DATA:  Fall, head injury, cervical spine trauma. EXAM: CT HEAD WITHOUT CONTRAST CT CERVICAL SPINE WITHOUT CONTRAST TECHNIQUE: Multidetector CT imaging of the head and cervical spine was performed following the standard protocol without intravenous contrast. Multiplanar CT image reconstructions of the cervical spine were also generated. COMPARISON:  Head CT dated 09/08/2016.  Brain MRI dated 09/08/2016. FINDINGS: CT HEAD FINDINGS Brain: Ventricles are within normal limits in size and configuration. Mild chronic small vessel ischemic change noted within the deep periventricular white matter regions. There is no mass, hemorrhage, edema or other evidence of acute parenchymal abnormality. No extra-axial hemorrhage. Vascular: There are chronic calcified atherosclerotic changes of the large vessels at the skull base. No unexpected hyperdense vessel. Skull: Normal. Negative for fracture or focal lesion. Sinuses/Orbits: No acute finding. Other: Soft tissue edema within the scalp overlying the midline occipital bones. No underlying fracture seen. Deformity of the anterior nasal bones, likely chronic. CT CERVICAL SPINE  FINDINGS Alignment: No evidence of acute vertebral body subluxation. Mild scoliosis. Reversal of the normal cervical lordosis is likely related to underlying degenerative change. Skull base and vertebrae: No fracture line or displaced fracture fragment identified. Facet joints appear intact and normally aligned. Soft tissues and spinal canal: No prevertebral fluid or swelling. No visible canal hematoma. Disc levels: Disc desiccations throughout the cervical spine, most pronounced at the C4-5 through C6-7 levels with associated disc space narrowings and osteophyte formation. Associated disc-osteophytic bulge at the C4-5 level is causing moderate to severe central canal stenosis. Additional disc -osteophytic bulges at the C5-6 and C6-7 levels causing moderate degrees of central canal stenosis. Additional degenerative hypertrophy of the uncovertebral and facet joints causing severe right neural foramen stenosis at C3-4 with possible associated nerve root impingement. Upper chest: Chronic scarring/ fibrosis at the left lung apex. No acute findings. Other: Carotid atherosclerosis. IMPRESSION: 1. Soft tissue edema within the scalp overlying the midline occipital bones. No underlying fracture. 2. No acute intracranial abnormality. No intracranial mass, hemorrhage or edema. No skull  fracture. 3. No fracture or acute subluxation within the cervical spine. Degenerative changes of the cervical spine, as detailed above. 4. Carotid atherosclerosis. Electronically Signed   By: Franki Cabot M.D.   On: 12/03/2016 07:56   Ct Cervical Spine Wo Contrast  Result Date: 12/03/2016 CLINICAL DATA:  Fall, head injury, cervical spine trauma. EXAM: CT HEAD WITHOUT CONTRAST CT CERVICAL SPINE WITHOUT CONTRAST TECHNIQUE: Multidetector CT imaging of the head and cervical spine was performed following the standard protocol without intravenous contrast. Multiplanar CT image reconstructions of the cervical spine were also generated.  COMPARISON:  Head CT dated 09/08/2016.  Brain MRI dated 09/08/2016. FINDINGS: CT HEAD FINDINGS Brain: Ventricles are within normal limits in size and configuration. Mild chronic small vessel ischemic change noted within the deep periventricular white matter regions. There is no mass, hemorrhage, edema or other evidence of acute parenchymal abnormality. No extra-axial hemorrhage. Vascular: There are chronic calcified atherosclerotic changes of the large vessels at the skull base. No unexpected hyperdense vessel. Skull: Normal. Negative for fracture or focal lesion. Sinuses/Orbits: No acute finding. Other: Soft tissue edema within the scalp overlying the midline occipital bones. No underlying fracture seen. Deformity of the anterior nasal bones, likely chronic. CT CERVICAL SPINE FINDINGS Alignment: No evidence of acute vertebral body subluxation. Mild scoliosis. Reversal of the normal cervical lordosis is likely related to underlying degenerative change. Skull base and vertebrae: No fracture line or displaced fracture fragment identified. Facet joints appear intact and normally aligned. Soft tissues and spinal canal: No prevertebral fluid or swelling. No visible canal hematoma. Disc levels: Disc desiccations throughout the cervical spine, most pronounced at the C4-5 through C6-7 levels with associated disc space narrowings and osteophyte formation. Associated disc-osteophytic bulge at the C4-5 level is causing moderate to severe central canal stenosis. Additional disc -osteophytic bulges at the C5-6 and C6-7 levels causing moderate degrees of central canal stenosis. Additional degenerative hypertrophy of the uncovertebral and facet joints causing severe right neural foramen stenosis at C3-4 with possible associated nerve root impingement. Upper chest: Chronic scarring/ fibrosis at the left lung apex. No acute findings. Other: Carotid atherosclerosis. IMPRESSION: 1. Soft tissue edema within the scalp overlying the  midline occipital bones. No underlying fracture. 2. No acute intracranial abnormality. No intracranial mass, hemorrhage or edema. No skull fracture. 3. No fracture or acute subluxation within the cervical spine. Degenerative changes of the cervical spine, as detailed above. 4. Carotid atherosclerosis. Electronically Signed   By: Franki Cabot M.D.   On: 12/03/2016 07:56     Assessment/Plan:  56 y.o. female  with diastolic congestive heart failure, hypertension, diabetes mellitus type II, diabetic retinopathy, diabetic neuropathy, ESRD on PD, anemia of CKD, SHPTH admitted now with syncope. Pt states she was walking and felt diaphoretic and fell prior to event. Currently back to baseline.  No hx of seizures.  CTH no acute intracranial pathology.   Currently back to baseline  Does complain of diabetic neuropathy specifically in finger tips and toes.  She is already on Neurontin 300 daily q hs Due to renal function will not increase it.   Follow up as out pt.  No further imaging from neuro stand point. Not convinced seizure activity.   12/04/2016, 12:22 PM

## 2016-12-04 NOTE — Evaluation (Signed)
Occupational Therapy Evaluation Patient Details Name: Jacqueline Rodriguez MRN: [id number] DOB: July 17, 1960 Today's Date: 12/04/2016    History of Present Illness presented to ER and admitted under observation after syncopal episode, fall in home environment.    Clinical Impression   Patient seen for OT evaluation this date.  Patient had a syncopal episode at home after a home dialysis treatment.  She reports feeling better now but still presents with weakness and recent onset of neuropathy in her bilateral UEs.  She requires min guard for functional mobility this date and is able to complete dressing skills with min guard for clothing negotiation and toilet transfers. She would benefit from skilled OT for 1-2 sessions to address neuropathy and strategies to help compensate as well as safety with functional mobility during self care activities.  Do not anticipate further OT needs at discharge.    Follow Up Recommendations  No OT follow up    Equipment Recommendations       Recommendations for Other Services       Precautions / Restrictions Precautions Precautions: Fall Restrictions Weight Bearing Restrictions: No      Mobility Bed Mobility Overal bed mobility: Modified Independent Bed Mobility: Supine to Sit;Sit to Supine              Transfers Overall transfer level: Needs assistance Equipment used: Rolling walker (2 wheeled) Transfers: Sit to/from Stand Sit to Stand: Min guard              Balance Overall balance assessment: Needs assistance Sitting-balance support: No upper extremity supported;Feet supported Sitting balance-Leahy Scale: Good     Standing balance support: Bilateral upper extremity supported Standing balance-Leahy Scale: Fair                             ADL either performed or assessed with clinical judgement   ADL Overall ADL's : Needs assistance/impaired   Eating/Feeding Details (indicate cue type and reason): Drops utensil  frequently, may benefit from adapted utensils or cuff to assist with self feeding Grooming: Modified independent   Upper Body Bathing: Modified independent   Lower Body Bathing: Modified independent   Upper Body Dressing : Modified independent   Lower Body Dressing: Min guard   Toilet Transfer: Min guard   Toileting- Clothing Manipulation and Hygiene: Min guard       Functional mobility during ADLs: Min guard General ADL Comments: Min guard for transfers and functional mobility this date without use of assistive device.       Vision Baseline Vision/History: Wears glasses Wears Glasses: At all times Patient Visual Report: No change from baseline       Perception     Praxis      Pertinent Vitals/Pain Pain Assessment: No/denies pain     Hand Dominance Right   Extremity/Trunk Assessment Upper Extremity Assessment Upper Extremity Assessment: RUE deficits/detail;LUE deficits/detail RUE Deficits / Details: Recent change in BUE with neuropathy now in hands and wrists.  Reports numbness, tingling, buring at the wrists and drops items frequently.  Decreased functional grasping patterns.   RUE Sensation: history of peripheral neuropathy RUE Coordination: decreased fine motor LUE Deficits / Details: Recent change in BUE with neuropathy now in hands and wrists.  Reports numbness, tingling, buring at the wrists and drops items frequently.  Decreased functional grasping patterns.   LUE Sensation: history of peripheral neuropathy LUE Coordination: decreased fine motor   Lower Extremity Assessment Lower Extremity Assessment: Defer to  PT evaluation   Cervical / Trunk Assessment Cervical / Trunk Assessment: Normal   Communication Communication Communication: No difficulties   Cognition Arousal/Alertness: Awake/alert Behavior During Therapy: WFL for tasks assessed/performed Overall Cognitive Status: Within Functional Limits for tasks assessed                                      General Comments       Exercises     Shoulder Instructions      Home Living Family/patient expects to be discharged to:: Private residence Living Arrangements: Spouse/significant other Available Help at Discharge: Family;Available PRN/intermittently Type of Home: House Home Access: Ramped entrance     Home Layout: One level     Bathroom Shower/Tub: Occupational psychologist: Standard Bathroom Accessibility: Yes   Home Equipment: Environmental consultant - 2 wheels;Wheelchair - manual          Prior Functioning/Environment Level of Independence: Independent        Comments: Pt is independent with all ADLs at baseline.  She ambulates household distances without AD, but reports 3 falls in previous six months.  Refuses use of assist device even if needed.        OT Problem List: Decreased strength;Impaired balance (sitting and/or standing);Impaired UE functional use;Decreased coordination      OT Treatment/Interventions: Self-care/ADL training;DME and/or AE instruction;Therapeutic activities;Balance training;Therapeutic exercise;Patient/family education    OT Goals(Current goals can be found in the care plan section) Acute Rehab OT Goals Patient Stated Goal: to go home and do as much as I can for myself" OT Goal Formulation: With patient Time For Goal Achievement: 12/11/16 Potential to Achieve Goals: Good  OT Frequency: Min 1X/week   Barriers to D/C:            Co-evaluation              AM-PAC PT "6 Clicks" Daily Activity     Outcome Measure Help from another person eating meals?: A Little Help from another person taking care of personal grooming?: None Help from another person toileting, which includes using toliet, bedpan, or urinal?: A Little Help from another person bathing (including washing, rinsing, drying)?: None Help from another person to put on and taking off regular upper body clothing?: None Help from another person to put on and  taking off regular lower body clothing?: A Little 6 Click Score: 21   End of Session Equipment Utilized During Treatment: Gait belt  Activity Tolerance: Patient tolerated treatment well Patient left: in chair;with call bell/phone within reach;with chair alarm set  OT Visit Diagnosis: Unsteadiness on feet (R26.81);Muscle weakness (generalized) (M62.81)                Time: 6599-3570 OT Time Calculation (min): 25 min Charges:  OT General Charges $OT Visit: 1 Visit OT Evaluation $OT Eval Low Complexity: 1 Low OT Treatments $Self Care/Home Management : 8-22 mins G-Codes: OT G-codes **NOT FOR INPATIENT CLASS** Functional Assessment Tool Used: AM-PAC 6 Clicks Daily Activity Functional Limitation: Self care Self Care Current Status (V7793): At least 20 percent but less than 40 percent impaired, limited or restricted Self Care Goal Status (J0300): At least 1 percent but less than 20 percent impaired, limited or restricted   Addalyn Speedy T Amish Mintzer, OTR/L, CLT   Lugene Hitt 12/04/2016, 11:22 AM

## 2016-12-05 DIAGNOSIS — D631 Anemia in chronic kidney disease: Secondary | ICD-10-CM | POA: Diagnosis not present

## 2016-12-05 DIAGNOSIS — Z992 Dependence on renal dialysis: Secondary | ICD-10-CM | POA: Diagnosis not present

## 2016-12-05 DIAGNOSIS — D509 Iron deficiency anemia, unspecified: Secondary | ICD-10-CM | POA: Diagnosis not present

## 2016-12-05 DIAGNOSIS — N186 End stage renal disease: Secondary | ICD-10-CM | POA: Diagnosis not present

## 2016-12-05 LAB — ECHOCARDIOGRAM COMPLETE
Height: 69 in
WEIGHTICAEL: 3220.8 [oz_av]

## 2016-12-05 NOTE — Discharge Summary (Signed)
[facility] NAME: Jacqueline Rodriguez    MR#:  [id number]  DATE OF BIRTH:  Oct 13, 1960  DATE OF ADMISSION:  12/03/2016 ADMITTING PHYSICIAN: Max Sane, MD  DATE OF DISCHARGE: 12/04/2016  3:45 PM  PRIMARY CARE PHYSICIAN: Hortencia Pilar, MD    ADMISSION DIAGNOSIS:  Syncope and collapse [R55] End stage renal disease ([place]) [N18.6] Elevated troponin I level [R74.8] Non-intractable vomiting with nausea, unspecified vomiting type [R11.2]  DISCHARGE DIAGNOSIS:  Active Problems:   Syncope    Orthostatic hypotension  SECONDARY DIAGNOSIS:   Past Medical History:  Diagnosis Date  . Anemia   . Chronic bronchitis ([place])   . Chronic diastolic CHF (congestive heart failure) ([place])   . CKD (chronic kidney disease), stage III ([place])   . COPD (chronic obstructive pulmonary disease) ([place])   . Diabetes mellitus with renal complications ([place])   . Essential hypertension   . GERD (gastroesophageal reflux disease)   . Obesity   . Peripheral vascular disease ([place])   . Pulmonary hypertension ([place])   . Renal insufficiency 09/19/2015   Stage 3 CKD. Beginning dialysis.    HOSPITAL COURSE:   * Syncope- severe orthostatic hypotension - Neuro consult appreciated, no further inputs. - echo, done- pending results. - IVFs- BP improved. - PT, OT c/s - off unit tele - TSH, HbA1c, Orthostatics - Made some changes in BP meds. Will not goal for tighter control as it drops significantly while standing up. - Nephro also explained the pt about PD , as she may have removed too much liquids.  * ESRD on PD - Nephro c/s appreciated.  * DM - continue home meds - ssi, check HbA1c  * HTN - continue lower dose Hydralazine, stopped norvasc - Hold Lasix  * CAD: continue asa - She is planning to switch her Cardio (From Dr Candis Musa to Dr Ubaldo Glassing)   [place]:   Stable.  CONSULTS OBTAINED:  Treatment Team:  Anthonette Legato, MD Alexis Goodell, MD Leotis Pain, MD  DRUG ALLERGIES:   Allergies  Allergen Reactions  . Ambien [Zolpidem] Other (See Comments)    hallucinations    DISCHARGE MEDICATIONS:   Discharge Medication List as of 12/04/2016 12:11 PM    CONTINUE these medications which have CHANGED   Details  hydrALAZINE (APRESOLINE) 10 MG tablet Take 1 tablet (10 mg total) by mouth 3 (three) times daily., Starting Sat 12/04/2016, Print      CONTINUE these medications which have NOT CHANGED   Details  acetaminophen (TYLENOL) 500 MG tablet Take 500 mg by mouth every 6 (six) hours as needed., Historical Med    albuterol (PROVENTIL HFA;VENTOLIN HFA) 108 (90 Base) MCG/ACT inhaler Inhale 2 puffs into the lungs every 6 (six) hours as needed for wheezing or shortness of breath., Starting Wed 01/08/2015, Print    aspirin EC 81 MG EC tablet Take 1 tablet (81 mg total) by mouth daily., Starting Thu 03/20/2015, Print    azithromycin (ZITHROMAX) 250 MG tablet One tablet daily as per DR Ola Spurr infectious disease, Print    calcium acetate (PHOSLO) 667 MG capsule Take 3 capsules (2,001 mg total) by mouth 3 (three) times daily with meals., Starting Fri 09/17/2016, Print    Calcium-Magnesium-Vitamin D (CALCIUM 1200+D3 PO) Take 1 tablet by mouth daily., Historical Med    cetirizine (ZYRTEC) 10 MG tablet Take 10 mg by mouth daily as needed for allergies., Historical Med    ferrous sulfate 325 (65 FE) MG EC tablet  Take 1 tablet (325 mg total) by mouth 2 (two) times daily., Starting Fri 09/17/2016, Until Sat 09/17/2017, Print    furosemide (LASIX) 40 MG tablet Take 1 tablet (40 mg total) by mouth daily., Starting Fri 09/17/2016, Print    gabapentin (NEURONTIN) 100 MG capsule Take 100 mg by mouth 3 (three) times daily as needed. For neuropathy., Starting Wed 03/05/2015, Historical Med    gentamicin cream (GARAMYCIN) 0.1 % Apply 1 application topically daily., Starting Fri 06/25/2016, Historical Med    GLUCOSAMINE-CHONDROITIN PO  Take 4,000 mg by mouth daily., Historical Med    HYDROcodone-acetaminophen (NORCO) 5-325 MG tablet Take 1 tablet by mouth every 6 (six) hours as needed for moderate pain., Starting Thu 06/10/2016, Print    isosorbide mononitrate (IMDUR) 30 MG 24 hr tablet Take 30 mg by mouth daily., Historical Med    magnesium oxide (MAG-OX) 400 (241.3 Mg) MG tablet Take 1 tablet (400 mg total) by mouth daily., Starting Fri 09/17/2016, Print    meclizine (ANTIVERT) 25 MG tablet Take 1 tablet (25 mg total) by mouth 3 (three) times daily as needed for dizziness., Starting Mon 12/08/2015, Print    ondansetron (ZOFRAN) 4 MG tablet Take 1 tablet (4 mg total) by mouth every 6 (six) hours as needed for nausea., Starting Fri 09/17/2016, Print    tobramycin 160 mg in dextrose 5 % 50 mL Inject 160 mg into the vein every Monday, Wednesday, and Friday at 6 PM., Starting Fri 09/17/2016, No Print      STOP taking these medications     amLODipine (NORVASC) 5 MG tablet          DISCHARGE INSTRUCTIONS:    Follow with nephro clinic and PMD in 1-2 weeks.  If you experience worsening of your admission symptoms, develop shortness of breath, life threatening emergency, suicidal or homicidal thoughts you must seek medical attention immediately by calling 911 or calling your MD immediately  if symptoms less severe.  You Must read complete instructions/literature along with all the possible adverse reactions/side effects for all the Medicines you take and that have been prescribed to you. Take any new Medicines after you have completely understood and accept all the possible adverse reactions/side effects.   Please note  You were cared for by a hospitalist during your hospital stay. If you have any questions about your discharge medications or the care you received while you were in the hospital after you are discharged, you can call the unit and asked to speak with the hospitalist on call if the hospitalist that took care of you  is not available. Once you are discharged, your primary care physician will handle any further medical issues. Please note that NO REFILLS for any discharge medications will be authorized once you are discharged, as it is imperative that you return to your primary care physician (or establish a relationship with a primary care physician if you do not have one) for your aftercare needs so that they can reassess your need for medications and monitor your lab values.    Today   CHIEF COMPLAINT:   Chief Complaint  Patient presents with  . Emesis  . Weakness    HISTORY OF PRESENT ILLNESS:  Jacqueline Rodriguez  is a 56 y.o. female with a known history of end-stage renal disease on peritoneal dialysis currentlywho presents to the ED for a fall and possible syncope. Husband states she was walking this morning and her legs gave way because she was weak. She subsequently leaned against the  wall at some point looks like she passed out. Her nose was bleeding from bumping her nose on the wall. Husband states she then passed out and convulsed briefly. Per husband she was incoherent for few seconds and had eyes rolled over as well.This has reportedly happened at one point in the past when her blood sugar was low. She feels this time she removed much more on last PD session. EDP discussed case with Dr Holley Raring who requested to bring her in and start fluids as he's concerned for dehydration.   VITAL SIGNS:  Blood pressure (!) 150/81, pulse 79, temperature 98.1 F (36.7 C), temperature source Oral, resp. rate 17, height 5\' 9"  (1.753 m), weight 94.8 kg (208 lb 14.4 oz), last menstrual period 09/18/2013, SpO2 100 %.  I/O:    Intake/Output Summary (Last 24 hours) at 12/05/2016 0742 Last data filed at 12/04/2016 1520 Gross per 24 hour  Intake 600 ml  Output 0 ml  Net 600 ml    PHYSICAL EXAMINATION:  GENERAL:  56 y.o.-year-old patient lying in the bed with no acute distress.  EYES: Pupils equal, round,  reactive to light and accommodation. No scleral icterus. Extraocular muscles intact.  HEENT: Head atraumatic, normocephalic. Oropharynx and nasopharynx clear.  NECK:  Supple, no jugular venous distention. No thyroid enlargement, no tenderness.  LUNGS: Normal breath sounds bilaterally, no wheezing, rales,rhonchi or crepitation. No use of accessory muscles of respiration.  CARDIOVASCULAR: S1, S2 normal. No murmurs, rubs, or gallops.  ABDOMEN: Soft, non-tender, non-distended. Bowel sounds present. No organomegaly or mass.  EXTREMITIES: No pedal edema, cyanosis, or clubbing.  NEUROLOGIC: Cranial nerves II through XII are intact. Muscle strength 5/5 in all extremities. Sensation intact. Gait not checked.  PSYCHIATRIC: The patient is alert and oriented x 3.  SKIN: No obvious rash, lesion, or ulcer.   DATA REVIEW:   CBC Recent Labs  Lab 12/04/16 0532  WBC 10.3  HGB 12.0  HCT 35.6  PLT 191    Chemistries  Recent Labs  Lab 12/03/16 0812 12/04/16 0532  NA 137 132*  K 4.1 4.6  CL 93* 92*  CO2 25 26  GLUCOSE 197* 68  BUN 57* 66*  CREATININE 13.10* 13.40*  CALCIUM 10.6* 9.8  AST 23  --   ALT 16  --   ALKPHOS 80  --   BILITOT 0.6  --     Cardiac Enzymes Recent Labs  Lab 12/03/16 0812  TROPONINI 0.06*    Microbiology Results  Results for orders placed or performed during the hospital encounter of 09/08/16  Urine culture     Status: Abnormal   Collection Time: 09/08/16 12:27 PM  Result Value Ref Range Status   Specimen Description URINE, RANDOM  Final   Special Requests NONE  Final   Culture MULTIPLE SPECIES PRESENT, SUGGEST RECOLLECTION (A)  Final   Report Status 09/10/2016 FINAL  Final  MRSA PCR Screening     Status: None   Collection Time: 09/08/16  6:05 PM  Result Value Ref Range Status   MRSA by PCR NEGATIVE NEGATIVE Final    Comment:        The GeneXpert MRSA Assay (FDA approved for NASAL specimens only), is one component of a comprehensive MRSA  colonization surveillance program. It is not intended to diagnose MRSA infection nor to guide or monitor treatment for MRSA infections.     RADIOLOGY:  Dg Chest 1 View  Result Date: 12/03/2016 CLINICAL DATA:  Weakness and fall. EXAM: CHEST 1 VIEW COMPARISON:  09/08/2016 and prior exams FINDINGS: The cardiomediastinal silhouette is unremarkable. A right IJ central venous catheter is noted with tip overlying the lower SVC. There is no evidence of focal airspace disease, pulmonary edema, suspicious pulmonary nodule/mass, pleural effusion, or pneumothorax. Left apical pleuroparenchymal scarring again noted. No acute bony abnormalities are identified. IMPRESSION: No active disease. Electronically Signed   By: Margarette Canada M.D.   On: 12/03/2016 09:37    EKG:   Orders placed or performed during the hospital encounter of 12/03/16  . ED EKG  . ED EKG  . EKG 12-Lead  . EKG 12-Lead      Management plans discussed with the patient, family and they are in agreement.  CODE STATUS:  Code Status History    Date Active Date Inactive Code Status Order ID Comments User Context   12/03/2016 12:01 12/04/2016 18:50 Full Code [id number]  Max Sane, MD Inpatient   09/13/2016 16:29 09/17/2016 18:17 Full Code [id number]  Epifanio Lesches, MD ED   09/08/2016 16:31 09/09/2016 18:23 Full Code [id number]  Fritzi Mandes, MD Inpatient   07/17/2015 12:44 07/24/2015 19:11 Full Code [id number]  Epifanio Lesches, MD ED   03/15/2015 19:56 03/20/2015 15:48 Full Code [id number]  Idelle Crouch, MD Inpatient      TOTAL TIME TAKING CARE OF THIS PATIENT: 35 minutes.    Vaughan Basta M.D on 12/05/2016 at 7:42 AM  Between 7am to 6pm - Pager - [phone]  After 6pm go to [email or web address] - password [email or web address] [facility]  Office  [phone]  CC: Primary care physician; Hortencia Pilar, MD   Note: This dictation was prepared with Dragon dictation along with smaller phrase technology. Any  transcriptional errors that result from this process are unintentional.

## 2016-12-06 ENCOUNTER — Encounter
Admission: RE | Disposition: A | Discharge status: Home / Self Care | Payer: Self-pay | Source: Ambulatory Visit | Attending: Vascular Surgery

## 2016-12-06 ENCOUNTER — Ambulatory Visit
Admission: RE | Admit: 2016-12-06 | Discharge: 2016-12-06 | Disposition: A | Discharge status: Home / Self Care | Payer: Medicare Other | Source: Ambulatory Visit | Attending: Vascular Surgery | Admitting: Vascular Surgery

## 2016-12-06 ENCOUNTER — Encounter: Payer: Self-pay | Admitting: Vascular Surgery

## 2016-12-06 DIAGNOSIS — Z452 Encounter for adjustment and management of vascular access device: Secondary | ICD-10-CM | POA: Insufficient documentation

## 2016-12-06 DIAGNOSIS — K219 Gastro-esophageal reflux disease without esophagitis: Secondary | ICD-10-CM | POA: Insufficient documentation

## 2016-12-06 DIAGNOSIS — I5032 Chronic diastolic (congestive) heart failure: Secondary | ICD-10-CM | POA: Diagnosis not present

## 2016-12-06 DIAGNOSIS — J449 Chronic obstructive pulmonary disease, unspecified: Secondary | ICD-10-CM | POA: Diagnosis not present

## 2016-12-06 DIAGNOSIS — E119 Type 2 diabetes mellitus without complications: Secondary | ICD-10-CM | POA: Diagnosis not present

## 2016-12-06 DIAGNOSIS — Z9889 Other specified postprocedural states: Secondary | ICD-10-CM | POA: Diagnosis not present

## 2016-12-06 DIAGNOSIS — Z888 Allergy status to other drugs, medicaments and biological substances status: Secondary | ICD-10-CM | POA: Diagnosis not present

## 2016-12-06 DIAGNOSIS — E11649 Type 2 diabetes mellitus with hypoglycemia without coma: Secondary | ICD-10-CM | POA: Insufficient documentation

## 2016-12-06 DIAGNOSIS — T82868A Thrombosis of vascular prosthetic devices, implants and grafts, initial encounter: Secondary | ICD-10-CM | POA: Diagnosis not present

## 2016-12-06 DIAGNOSIS — E1151 Type 2 diabetes mellitus with diabetic peripheral angiopathy without gangrene: Secondary | ICD-10-CM | POA: Insufficient documentation

## 2016-12-06 DIAGNOSIS — I132 Hypertensive heart and chronic kidney disease with heart failure and with stage 5 chronic kidney disease, or end stage renal disease: Secondary | ICD-10-CM | POA: Insufficient documentation

## 2016-12-06 DIAGNOSIS — I6529 Occlusion and stenosis of unspecified carotid artery: Secondary | ICD-10-CM | POA: Insufficient documentation

## 2016-12-06 DIAGNOSIS — E1122 Type 2 diabetes mellitus with diabetic chronic kidney disease: Secondary | ICD-10-CM | POA: Diagnosis not present

## 2016-12-06 DIAGNOSIS — E669 Obesity, unspecified: Secondary | ICD-10-CM | POA: Diagnosis not present

## 2016-12-06 DIAGNOSIS — N186 End stage renal disease: Secondary | ICD-10-CM | POA: Diagnosis not present

## 2016-12-06 DIAGNOSIS — I10 Essential (primary) hypertension: Secondary | ICD-10-CM | POA: Diagnosis not present

## 2016-12-06 DIAGNOSIS — D509 Iron deficiency anemia, unspecified: Secondary | ICD-10-CM | POA: Diagnosis not present

## 2016-12-06 DIAGNOSIS — Z833 Family history of diabetes mellitus: Secondary | ICD-10-CM | POA: Insufficient documentation

## 2016-12-06 DIAGNOSIS — D631 Anemia in chronic kidney disease: Secondary | ICD-10-CM | POA: Diagnosis not present

## 2016-12-06 DIAGNOSIS — J8410 Pulmonary fibrosis, unspecified: Secondary | ICD-10-CM | POA: Insufficient documentation

## 2016-12-06 DIAGNOSIS — Z87891 Personal history of nicotine dependence: Secondary | ICD-10-CM | POA: Insufficient documentation

## 2016-12-06 DIAGNOSIS — Z8249 Family history of ischemic heart disease and other diseases of the circulatory system: Secondary | ICD-10-CM | POA: Insufficient documentation

## 2016-12-06 DIAGNOSIS — I272 Pulmonary hypertension, unspecified: Secondary | ICD-10-CM | POA: Diagnosis not present

## 2016-12-06 DIAGNOSIS — Z992 Dependence on renal dialysis: Secondary | ICD-10-CM | POA: Insufficient documentation

## 2016-12-06 HISTORY — PX: DIALYSIS/PERMA CATHETER REMOVAL: CATH118289

## 2016-12-06 LAB — GLUCOSE, CAPILLARY: Glucose-Capillary: 114 mg/dL — ABNORMAL HIGH (ref 65–99)

## 2016-12-06 SURGERY — DIALYSIS/PERMA CATHETER REMOVAL
Anesthesia: LOCAL

## 2016-12-06 MED ORDER — LIDOCAINE-EPINEPHRINE (PF) 1 %-1:200000 IJ SOLN
INTRAMUSCULAR | Status: DC | PRN
  Administered 2016-12-06: 20 mL via INTRADERMAL

## 2016-12-06 SURGICAL SUPPLY — 2 items
FORCEPS HALSTEAD CVD 5IN STRL (INSTRUMENTS) ×2 IMPLANT
TRAY LACERAT/PLASTIC (MISCELLANEOUS) ×2 IMPLANT

## 2016-12-06 NOTE — H&P (Signed)
[facility] Admission History & Physical  MRN : [id number]  Jacqueline Rodriguez is a 56 y.o. (Apr 15, 1960) female who presents with chief complaint of No chief complaint on file. [name]  History of Present Illness: I am asked to evaluate the patient by the dialysis center. The patient was sent here because they have a nonfunctioning tunneled catheter and a functioning access.  The patient reports they're not been any problems with any of their dialysis runs. They are reporting good flows with good parameters at dialysis.  Patient denies pain or tenderness overlying the access.  There is no pain with dialysis.  The patient denies hand pain or finger pain consistent with steal syndrome.  No fevers or chills while on dialysis.   No current facility-administered medications for this encounter.     Past Medical History:  Diagnosis Date  . Anemia   . Chronic bronchitis ([place])   . Chronic diastolic CHF (congestive heart failure) ([place])   . CKD (chronic kidney disease), stage III ([place])   . COPD (chronic obstructive pulmonary disease) ([place])   . Diabetes mellitus with renal complications ([place])   . Essential hypertension   . GERD (gastroesophageal reflux disease)   . Obesity   . Peripheral vascular disease ([place])   . Pulmonary hypertension ([place])   . Renal insufficiency 09/19/2015   Stage 3 CKD. Beginning dialysis.    Past Surgical History:  Procedure Laterality Date  . A/V FISTULAGRAM N/A 09/16/2016   Procedure: A/V Fistulagram;  Surgeon: Algernon Huxley, MD;  Location: [facility];  Service: Cardiovascular;  Laterality: N/A;  . AV FISTULA PLACEMENT Left 10/01/2015   Procedure: ARTERIOVENOUS (AV) FISTULA CREATION ( BRACHIAL CEPHALIC );  Surgeon: Algernon Huxley, MD;  Location: [facility];  Service: Vascular;  Laterality: Left;  . CAPD INSERTION N/A 06/10/2016   Procedure: LAPAROSCOPIC INSERTION CONTINUOUS AMBULATORY PERITONEAL DIALYSIS  (CAPD) CATHETER;  Surgeon: Algernon Huxley, MD;   Location: [facility];  Service: Vascular;  Laterality: N/A;  . DIALYSIS/PERMA CATHETER INSERTION  09/16/2016   Procedure: DIALYSIS/PERMA CATHETER INSERTION;  Surgeon: Algernon Huxley, MD;  Location: [facility];  Service: Cardiovascular;;  . PERIPHERAL VASCULAR CATHETERIZATION N/A 07/21/2015   Procedure: Dialysis/Perma Catheter;  Surgeon: Algernon Huxley, MD;  Location: [facility];  Service: Cardiovascular;  Laterality: N/A;  . PERIPHERAL VASCULAR CATHETERIZATION N/A 12/11/2015   Procedure: Dialysis/Perma Catheter Removal;  Surgeon: Algernon Huxley, MD;  Location: [facility];  Service: Cardiovascular;  Laterality: N/A;    Social History Social History   Tobacco Use  . Smoking status: Former Smoker    Types: Cigarettes    Last attempt to quit: 03/24/1985    Years since quitting: 31.7  . Smokeless tobacco: Never Used  Substance Use Topics  . Alcohol use: No    Alcohol/week: 0.0 oz  . Drug use: No    Family History Family History  Problem Relation Age of Onset  . Hypertension Mother   . Diabetes Mellitus II Mother   . CAD Father   . Hypertension Father   . Heart attack Father     No family history of bleeding or clotting disorders, autoimmune disease or porphyria  Allergies  Allergen Reactions  . Ambien [Zolpidem] Other (See Comments)    hallucinations     REVIEW OF SYSTEMS (Negative unless checked)  Constitutional: [] Weight loss  [] Fever  [] Chills Cardiac: [] Chest pain   [] Chest pressure   [] Palpitations   [] Shortness  of breath when laying flat   [] Shortness of breath at rest   [x] Shortness of breath with exertion. Vascular:  [] Pain in legs with walking   [] Pain in legs at rest   [] Pain in legs when laying flat   [] Claudication   [] Pain in feet when walking  [] Pain in feet at rest  [] Pain in feet when laying flat   [] History of DVT   [] Phlebitis   [x] Swelling in legs   [] Varicose veins   [] Non-healing ulcers Pulmonary:   [] Uses home oxygen   [] Productive cough    [] Hemoptysis   [] Wheeze  [] COPD   [] Asthma Neurologic:  [] Dizziness  [] Blackouts   [] Seizures   [] History of stroke   [] History of TIA  [] Aphasia   [] Temporary blindness   [] Dysphagia   [] Weakness or numbness in arms   [] Weakness or numbness in legs Musculoskeletal:  [] Arthritis   [] Joint swelling   [] Joint pain   [] Low back pain Hematologic:  [] Easy bruising  [] Easy bleeding   [] Hypercoagulable state   [] Anemic  [] Hepatitis Gastrointestinal:  [] Blood in stool   [] Vomiting blood  [] Gastroesophageal reflux/heartburn   [] Difficulty swallowing. Genitourinary:  [x] Chronic kidney disease   [] Difficult urination  [] Frequent urination  [] Burning with urination   [] Blood in urine Skin:  [] Rashes   [] Ulcers   [] Wounds Psychological:  [] History of anxiety   []  History of major depression.  Physical Examination  There were no vitals filed for this visit. There is no height or weight on file to calculate BMI. Gen: WD/WN, NAD Head: [place]/AT, No temporalis wasting. Prominent temp pulse not noted. Ear/Nose/Throat: Hearing grossly intact, nares w/o erythema or drainage, oropharynx w/o Erythema/Exudate,  Eyes: Conjunctiva clear, sclera non-icteric Neck: Trachea midline.  No JVD.  Pulmonary:  Good air movement, respirations not labored, no use of accessory muscles.  Cardiac: RRR, normal S1, S2. Vascular: good thrill in AVF Vessel Right Left  Radial Palpable Palpable               Musculoskeletal: M/S 5/5 throughout.  Extremities without ischemic changes.  No deformity or atrophy.  Neurologic: Sensation grossly intact in extremities.  Symmetrical.  Speech is fluent. Motor exam as listed above. Psychiatric: Judgment intact, Mood & affect appropriate for pt's clinical situation. Dermatologic: No rashes or ulcers noted.  No cellulitis or open wounds.    CBC Lab Results  Component Value Date   WBC 10.3 12/04/2016   HGB 12.0 12/04/2016   HCT 35.6 12/04/2016   MCV 92.8 12/04/2016   PLT 191 12/04/2016     BMET    Component Value Date/Time   NA 132 (L) 12/04/2016 0532   NA 137 12/29/2013 2029   K 4.6 12/04/2016 0532   K 4.3 12/29/2013 2029   CL 92 (L) 12/04/2016 0532   CL 104 12/29/2013 2029   CO2 26 12/04/2016 0532   CO2 25 12/29/2013 2029   GLUCOSE 68 12/04/2016 0532   GLUCOSE 357 (H) 12/29/2013 2029   BUN 66 (H) 12/04/2016 0532   BUN 25 (H) 12/29/2013 2029   CREATININE 13.40 (H) 12/04/2016 0532   CREATININE 1.55 (H) 12/29/2013 2029   CALCIUM 9.8 12/04/2016 0532   CALCIUM 8.1 (L) 12/29/2013 2029   GFRNONAA 3 (L) 12/04/2016 0532   GFRNONAA 37 (L) 12/29/2013 2029   GFRNONAA 42 (L) 07/13/2013 1941   GFRAA 3 (L) 12/04/2016 0532   GFRAA 45 (L) 12/29/2013 2029   GFRAA 49 (L) 07/13/2013 1941   Estimated Creatinine Clearance: 5.6 mL/min (A) (by  C-G formula based on SCr of 13.4 mg/dL (H)).  COAG Lab Results  Component Value Date   INR 1.11 06/03/2016   INR 1.05 08/27/2015   INR 1.12 07/21/2015    Radiology Dg Chest 1 View  Result Date: 12/03/2016 CLINICAL DATA:  Weakness and fall. EXAM: CHEST 1 VIEW COMPARISON:  09/08/2016 and prior exams FINDINGS: The cardiomediastinal silhouette is unremarkable. A right IJ central venous catheter is noted with tip overlying the lower SVC. There is no evidence of focal airspace disease, pulmonary edema, suspicious pulmonary nodule/mass, pleural effusion, or pneumothorax. Left apical pleuroparenchymal scarring again noted. No acute bony abnormalities are identified. IMPRESSION: No active disease. Electronically Signed   By: Margarette Canada M.D.   On: 12/03/2016 09:37   Ct Head Wo Contrast  Result Date: 12/03/2016 CLINICAL DATA:  Fall, head injury, cervical spine trauma. EXAM: CT HEAD WITHOUT CONTRAST CT CERVICAL SPINE WITHOUT CONTRAST TECHNIQUE: Multidetector CT imaging of the head and cervical spine was performed following the standard protocol without intravenous contrast. Multiplanar CT image reconstructions of the cervical spine were also  generated. COMPARISON:  Head CT dated 09/08/2016.  Brain MRI dated 09/08/2016. FINDINGS: CT HEAD FINDINGS Brain: Ventricles are within normal limits in size and configuration. Mild chronic small vessel ischemic change noted within the deep periventricular white matter regions. There is no mass, hemorrhage, edema or other evidence of acute parenchymal abnormality. No extra-axial hemorrhage. Vascular: There are chronic calcified atherosclerotic changes of the large vessels at the skull base. No unexpected hyperdense vessel. Skull: Normal. Negative for fracture or focal lesion. Sinuses/Orbits: No acute finding. Other: Soft tissue edema within the scalp overlying the midline occipital bones. No underlying fracture seen. Deformity of the anterior nasal bones, likely chronic. CT CERVICAL SPINE FINDINGS Alignment: No evidence of acute vertebral body subluxation. Mild scoliosis. Reversal of the normal cervical lordosis is likely related to underlying degenerative change. Skull base and vertebrae: No fracture line or displaced fracture fragment identified. Facet joints appear intact and normally aligned. Soft tissues and spinal canal: No prevertebral fluid or swelling. No visible canal hematoma. Disc levels: Disc desiccations throughout the cervical spine, most pronounced at the C4-5 through C6-7 levels with associated disc space narrowings and osteophyte formation. Associated disc-osteophytic bulge at the C4-5 level is causing moderate to severe central canal stenosis. Additional disc -osteophytic bulges at the C5-6 and C6-7 levels causing moderate degrees of central canal stenosis. Additional degenerative hypertrophy of the uncovertebral and facet joints causing severe right neural foramen stenosis at C3-4 with possible associated nerve root impingement. Upper chest: Chronic scarring/ fibrosis at the left lung apex. No acute findings. Other: Carotid atherosclerosis. IMPRESSION: 1. Soft tissue edema within the scalp  overlying the midline occipital bones. No underlying fracture. 2. No acute intracranial abnormality. No intracranial mass, hemorrhage or edema. No skull fracture. 3. No fracture or acute subluxation within the cervical spine. Degenerative changes of the cervical spine, as detailed above. 4. Carotid atherosclerosis. Electronically Signed   By: Franki Cabot M.D.   On: 12/03/2016 07:56   Ct Cervical Spine Wo Contrast  Result Date: 12/03/2016 CLINICAL DATA:  Fall, head injury, cervical spine trauma. EXAM: CT HEAD WITHOUT CONTRAST CT CERVICAL SPINE WITHOUT CONTRAST TECHNIQUE: Multidetector CT imaging of the head and cervical spine was performed following the standard protocol without intravenous contrast. Multiplanar CT image reconstructions of the cervical spine were also generated. COMPARISON:  Head CT dated 09/08/2016.  Brain MRI dated 09/08/2016. FINDINGS: CT HEAD FINDINGS Brain: Ventricles are within normal limits  in size and configuration. Mild chronic small vessel ischemic change noted within the deep periventricular white matter regions. There is no mass, hemorrhage, edema or other evidence of acute parenchymal abnormality. No extra-axial hemorrhage. Vascular: There are chronic calcified atherosclerotic changes of the large vessels at the skull base. No unexpected hyperdense vessel. Skull: Normal. Negative for fracture or focal lesion. Sinuses/Orbits: No acute finding. Other: Soft tissue edema within the scalp overlying the midline occipital bones. No underlying fracture seen. Deformity of the anterior nasal bones, likely chronic. CT CERVICAL SPINE FINDINGS Alignment: No evidence of acute vertebral body subluxation. Mild scoliosis. Reversal of the normal cervical lordosis is likely related to underlying degenerative change. Skull base and vertebrae: No fracture line or displaced fracture fragment identified. Facet joints appear intact and normally aligned. Soft tissues and spinal canal: No prevertebral  fluid or swelling. No visible canal hematoma. Disc levels: Disc desiccations throughout the cervical spine, most pronounced at the C4-5 through C6-7 levels with associated disc space narrowings and osteophyte formation. Associated disc-osteophytic bulge at the C4-5 level is causing moderate to severe central canal stenosis. Additional disc -osteophytic bulges at the C5-6 and C6-7 levels causing moderate degrees of central canal stenosis. Additional degenerative hypertrophy of the uncovertebral and facet joints causing severe right neural foramen stenosis at C3-4 with possible associated nerve root impingement. Upper chest: Chronic scarring/ fibrosis at the left lung apex. No acute findings. Other: Carotid atherosclerosis. IMPRESSION: 1. Soft tissue edema within the scalp overlying the midline occipital bones. No underlying fracture. 2. No acute intracranial abnormality. No intracranial mass, hemorrhage or edema. No skull fracture. 3. No fracture or acute subluxation within the cervical spine. Degenerative changes of the cervical spine, as detailed above. 4. Carotid atherosclerosis. Electronically Signed   By: Franki Cabot M.D.   On: 12/03/2016 07:56    Assessment/Plan 1.  Complication dialysis device:  Patient's Tunneled catheter is not being used. The patient has an extremity access that is functioning well. Therefore, the patient will undergo removal of the tunneled catheter under local anesthesia.  The risks and benefits were described to the patient.  All questions were answered.  The patient agrees to proceed with angiography and intervention. Potassium will be drawn to ensure that it is an appropriate level prior to performing intervention. 2.  End-stage renal disease requiring hemodialysis:  Patient will continue dialysis therapy without further interruption if a successful intervention is not achieved then a tunneled catheter will be placed. Dialysis has already been arranged. 3.  Hypertension:   Patient will continue medical management; nephrology is following no changes in oral medications. 4.  Diabetes mellitus:  Glucose will be monitored and oral medications been held this morning once the patient has undergone the patient's procedure po intake will be reinitiated and again Accu-Cheks will be used to assess the blood glucose level and treat as needed. The patient will be restarted on the patient's usual hypoglycemic regime     Leotis Pain, MD  12/06/2016 10:58 AM

## 2016-12-06 NOTE — Op Note (Signed)
Operative Note     Preoperative diagnosis:   1. ESRD with functional permanent access  Postoperative diagnosis:  1. ESRD with functional permanent access  Procedure:  Removal of right jugular Permcath  Surgeon:  Leotis Pain, MD  Anesthesia:  Local  EBL:  Minimal  Indication for the Procedure:  The patient has a functional permanent dialysis access and no longer needs their permcath.  This can be removed.  Risks and benefits are discussed and informed consent is obtained.  Description of the Procedure:  The patient's right neck, chest and existing catheter were sterilely prepped and draped. The area around the catheter was anesthetized copiously with 1% lidocaine. The catheter was dissected out with curved hemostats until the cuff was freed from the surrounding fibrous sheath. The fiber sheath was transected, and the catheter was then removed in its entirety using gentle traction. Pressure was held and sterile dressings were placed. The patient tolerated the procedure well and was taken to the recovery room in stable condition.     Leotis Pain  12/06/2016, 11:27 AM This note was created with Dragon Medical transcription system. Any errors in dictation are purely unintentional.

## 2016-12-06 NOTE — Discharge Instructions (Signed)
Incision and Drainage, Care After  Refer to this sheet in the next few weeks. These instructions provide you with information about caring for yourself after your procedure. Your health care provider may also give you more specific instructions. Your treatment has been planned according to current medical practices, but problems sometimes occur. Call your health care provider if you have any problems or questions after your procedure.  What can I expect after the procedure?  After the procedure, it is common to have:  · Pain or discomfort around your incision site.  · Drainage from your incision.     Follow these instructions at home:  ·   · Take over-the-counter and prescription medicines only as told by your health care provider.  · If you were prescribed an antibiotic medicine, take it as told by your health care provider. Do not stop taking the antibiotic even if you start to feel better.  · Follow instructions from your health care provider about:  ? How to take care of your incision.  ? When and how you should change your packing and bandage (dressing). Wash your hands with soap and water before you change your dressing. If soap and water are not available, use hand sanitizer.  ? When you should remove your dressing.  · Do not take baths, swim, or use a hot tub until your health care provider approves.  · Keep all follow-up visits as told by your health care provider. This is important.  · Check your incision area every day for signs of infection. Check for:  ? More redness, swelling, or pain.  ? More fluid or blood.  ? Warmth.  ? Pus or a bad smell.  Contact a health care provider if:  · Your cyst or abscess returns.  · You have a fever.  · You have more redness, swelling, or pain around your incision.  · You have more fluid or blood coming from your incision.  · Your incision feels warm to the touch.  · You have pus or a bad smell coming from your incision.  Get help right away if:  · You have severe pain or  bleeding.  · You cannot eat or drink without vomiting.  · You have decreased urine output.  · You become short of breath.  · You have chest pain.  · You cough up blood.  · The area where the incision and drainage occurred becomes numb or it tingles.  This information is not intended to replace advice given to you by your health care provider. Make sure you discuss any questions you have with your health care provider.  Document Released: 03/22/2011 Document Revised: 05/30/2015 Document Reviewed: 10/18/2014  Elsevier Interactive Patient Education © 2017 [employer] Inc[employer]   

## 2016-12-07 DIAGNOSIS — D631 Anemia in chronic kidney disease: Secondary | ICD-10-CM | POA: Diagnosis not present

## 2016-12-07 DIAGNOSIS — Z992 Dependence on renal dialysis: Secondary | ICD-10-CM | POA: Diagnosis not present

## 2016-12-07 DIAGNOSIS — D509 Iron deficiency anemia, unspecified: Secondary | ICD-10-CM | POA: Diagnosis not present

## 2016-12-07 DIAGNOSIS — N186 End stage renal disease: Secondary | ICD-10-CM | POA: Diagnosis not present

## 2016-12-08 DIAGNOSIS — Z992 Dependence on renal dialysis: Secondary | ICD-10-CM | POA: Diagnosis not present

## 2016-12-08 DIAGNOSIS — N186 End stage renal disease: Secondary | ICD-10-CM | POA: Diagnosis not present

## 2016-12-08 DIAGNOSIS — D631 Anemia in chronic kidney disease: Secondary | ICD-10-CM | POA: Diagnosis not present

## 2016-12-08 DIAGNOSIS — D509 Iron deficiency anemia, unspecified: Secondary | ICD-10-CM | POA: Diagnosis not present

## 2016-12-09 DIAGNOSIS — N186 End stage renal disease: Secondary | ICD-10-CM | POA: Diagnosis not present

## 2016-12-09 DIAGNOSIS — D509 Iron deficiency anemia, unspecified: Secondary | ICD-10-CM | POA: Diagnosis not present

## 2016-12-09 DIAGNOSIS — D631 Anemia in chronic kidney disease: Secondary | ICD-10-CM | POA: Diagnosis not present

## 2016-12-09 DIAGNOSIS — Z992 Dependence on renal dialysis: Secondary | ICD-10-CM | POA: Diagnosis not present

## 2016-12-10 DIAGNOSIS — D631 Anemia in chronic kidney disease: Secondary | ICD-10-CM | POA: Diagnosis not present

## 2016-12-10 DIAGNOSIS — Z992 Dependence on renal dialysis: Secondary | ICD-10-CM | POA: Diagnosis not present

## 2016-12-10 DIAGNOSIS — N186 End stage renal disease: Secondary | ICD-10-CM | POA: Diagnosis not present

## 2016-12-10 DIAGNOSIS — D509 Iron deficiency anemia, unspecified: Secondary | ICD-10-CM | POA: Diagnosis not present

## 2016-12-11 DIAGNOSIS — N186 End stage renal disease: Secondary | ICD-10-CM | POA: Diagnosis not present

## 2016-12-11 DIAGNOSIS — D509 Iron deficiency anemia, unspecified: Secondary | ICD-10-CM | POA: Diagnosis not present

## 2016-12-11 DIAGNOSIS — Z992 Dependence on renal dialysis: Secondary | ICD-10-CM | POA: Diagnosis not present

## 2016-12-12 DIAGNOSIS — Z992 Dependence on renal dialysis: Secondary | ICD-10-CM | POA: Diagnosis not present

## 2016-12-12 DIAGNOSIS — D509 Iron deficiency anemia, unspecified: Secondary | ICD-10-CM | POA: Diagnosis not present

## 2016-12-12 DIAGNOSIS — N186 End stage renal disease: Secondary | ICD-10-CM | POA: Diagnosis not present

## 2016-12-13 DIAGNOSIS — N186 End stage renal disease: Secondary | ICD-10-CM | POA: Diagnosis not present

## 2016-12-13 DIAGNOSIS — Z992 Dependence on renal dialysis: Secondary | ICD-10-CM | POA: Diagnosis not present

## 2016-12-13 DIAGNOSIS — D509 Iron deficiency anemia, unspecified: Secondary | ICD-10-CM | POA: Diagnosis not present

## 2016-12-14 DIAGNOSIS — D509 Iron deficiency anemia, unspecified: Secondary | ICD-10-CM | POA: Diagnosis not present

## 2016-12-14 DIAGNOSIS — N186 End stage renal disease: Secondary | ICD-10-CM | POA: Diagnosis not present

## 2016-12-14 DIAGNOSIS — Z992 Dependence on renal dialysis: Secondary | ICD-10-CM | POA: Diagnosis not present

## 2016-12-15 ENCOUNTER — Encounter (INDEPENDENT_AMBULATORY_CARE_PROVIDER_SITE_OTHER): Payer: Self-pay

## 2016-12-15 ENCOUNTER — Ambulatory Visit (INDEPENDENT_AMBULATORY_CARE_PROVIDER_SITE_OTHER): Payer: Self-pay | Admitting: Vascular Surgery

## 2016-12-15 DIAGNOSIS — N186 End stage renal disease: Secondary | ICD-10-CM | POA: Diagnosis not present

## 2016-12-15 DIAGNOSIS — D509 Iron deficiency anemia, unspecified: Secondary | ICD-10-CM | POA: Diagnosis not present

## 2016-12-15 DIAGNOSIS — Z992 Dependence on renal dialysis: Secondary | ICD-10-CM | POA: Diagnosis not present

## 2016-12-16 ENCOUNTER — Observation Stay
Admission: EM | Admit: 2016-12-16 | Discharge: 2016-12-17 | Disposition: A | Discharge status: Home / Self Care | Payer: Medicare Other | Attending: Internal Medicine | Admitting: Internal Medicine

## 2016-12-16 ENCOUNTER — Emergency Department: Payer: Medicare Other

## 2016-12-16 ENCOUNTER — Other Ambulatory Visit: Payer: Self-pay

## 2016-12-16 DIAGNOSIS — Z7982 Long term (current) use of aspirin: Secondary | ICD-10-CM | POA: Diagnosis not present

## 2016-12-16 DIAGNOSIS — I951 Orthostatic hypotension: Principal | ICD-10-CM | POA: Insufficient documentation

## 2016-12-16 DIAGNOSIS — I5032 Chronic diastolic (congestive) heart failure: Secondary | ICD-10-CM | POA: Diagnosis not present

## 2016-12-16 DIAGNOSIS — D631 Anemia in chronic kidney disease: Secondary | ICD-10-CM | POA: Diagnosis not present

## 2016-12-16 DIAGNOSIS — R531 Weakness: Secondary | ICD-10-CM | POA: Diagnosis not present

## 2016-12-16 DIAGNOSIS — Z79899 Other long term (current) drug therapy: Secondary | ICD-10-CM | POA: Insufficient documentation

## 2016-12-16 DIAGNOSIS — R7989 Other specified abnormal findings of blood chemistry: Secondary | ICD-10-CM

## 2016-12-16 DIAGNOSIS — E1122 Type 2 diabetes mellitus with diabetic chronic kidney disease: Secondary | ICD-10-CM | POA: Diagnosis not present

## 2016-12-16 DIAGNOSIS — I132 Hypertensive heart and chronic kidney disease with heart failure and with stage 5 chronic kidney disease, or end stage renal disease: Secondary | ICD-10-CM | POA: Diagnosis not present

## 2016-12-16 DIAGNOSIS — R55 Syncope and collapse: Secondary | ICD-10-CM | POA: Diagnosis not present

## 2016-12-16 DIAGNOSIS — E11319 Type 2 diabetes mellitus with unspecified diabetic retinopathy without macular edema: Secondary | ICD-10-CM | POA: Diagnosis not present

## 2016-12-16 DIAGNOSIS — R778 Other specified abnormalities of plasma proteins: Secondary | ICD-10-CM

## 2016-12-16 DIAGNOSIS — E1151 Type 2 diabetes mellitus with diabetic peripheral angiopathy without gangrene: Secondary | ICD-10-CM | POA: Insufficient documentation

## 2016-12-16 DIAGNOSIS — Z992 Dependence on renal dialysis: Secondary | ICD-10-CM | POA: Insufficient documentation

## 2016-12-16 DIAGNOSIS — E860 Dehydration: Secondary | ICD-10-CM | POA: Insufficient documentation

## 2016-12-16 DIAGNOSIS — E1140 Type 2 diabetes mellitus with diabetic neuropathy, unspecified: Secondary | ICD-10-CM | POA: Insufficient documentation

## 2016-12-16 DIAGNOSIS — J449 Chronic obstructive pulmonary disease, unspecified: Secondary | ICD-10-CM | POA: Insufficient documentation

## 2016-12-16 DIAGNOSIS — D509 Iron deficiency anemia, unspecified: Secondary | ICD-10-CM | POA: Diagnosis not present

## 2016-12-16 DIAGNOSIS — N186 End stage renal disease: Secondary | ICD-10-CM | POA: Diagnosis not present

## 2016-12-16 DIAGNOSIS — E1129 Type 2 diabetes mellitus with other diabetic kidney complication: Secondary | ICD-10-CM | POA: Diagnosis not present

## 2016-12-16 DIAGNOSIS — I509 Heart failure, unspecified: Secondary | ICD-10-CM | POA: Diagnosis not present

## 2016-12-16 DIAGNOSIS — N183 Chronic kidney disease, stage 3 (moderate): Secondary | ICD-10-CM | POA: Diagnosis not present

## 2016-12-16 DIAGNOSIS — J9811 Atelectasis: Secondary | ICD-10-CM | POA: Diagnosis not present

## 2016-12-16 DIAGNOSIS — Z87891 Personal history of nicotine dependence: Secondary | ICD-10-CM | POA: Insufficient documentation

## 2016-12-16 LAB — CBC WITH DIFFERENTIAL/PLATELET
BASOS ABS: 0.1 10*3/uL (ref 0–0.1)
Basophils Relative: 1 %
Eosinophils Absolute: 0.2 10*3/uL (ref 0–0.7)
Eosinophils Relative: 2 %
HEMATOCRIT: 40.4 % (ref 35.0–47.0)
Hemoglobin: 13.5 g/dL (ref 12.0–16.0)
LYMPHS ABS: 1.2 10*3/uL (ref 1.0–3.6)
LYMPHS PCT: 11 %
MCH: 30.5 pg (ref 26.0–34.0)
MCHC: 33.5 g/dL (ref 32.0–36.0)
MCV: 90.9 fL (ref 80.0–100.0)
MONO ABS: 0.8 10*3/uL (ref 0.2–0.9)
MONOS PCT: 7 %
NEUTROS ABS: 9.1 10*3/uL — AB (ref 1.4–6.5)
Neutrophils Relative %: 79 %
Platelets: 269 10*3/uL (ref 150–440)
RBC: 4.44 MIL/uL (ref 3.80–5.20)
RDW: 16 % — AB (ref 11.5–14.5)
WBC: 11.4 10*3/uL — ABNORMAL HIGH (ref 3.6–11.0)

## 2016-12-16 LAB — TROPONIN I
TROPONIN I: 0.07 ng/mL — AB (ref <0.03)
TROPONIN I: 0.06 ng/mL — AB (ref <0.03)
Troponin I: 0.08 ng/mL (ref <0.03)

## 2016-12-16 LAB — GLUCOSE, CAPILLARY
GLUCOSE-CAPILLARY: 99 mg/dL (ref 65–99)
Glucose-Capillary: 125 mg/dL — ABNORMAL HIGH (ref 65–99)
Glucose-Capillary: 172 mg/dL — ABNORMAL HIGH (ref 65–99)

## 2016-12-16 LAB — COMPREHENSIVE METABOLIC PANEL
ALT: 25 U/L (ref 14–54)
AST: 38 U/L (ref 15–41)
Albumin: 3.8 g/dL (ref 3.5–5.0)
Alkaline Phosphatase: 74 U/L (ref 38–126)
Anion gap: 19 — ABNORMAL HIGH (ref 5–15)
BILIRUBIN TOTAL: 0.8 mg/dL (ref 0.3–1.2)
BUN: 48 mg/dL — AB (ref 6–20)
CO2: 23 mmol/L (ref 22–32)
CREATININE: 10.84 mg/dL — AB (ref 0.44–1.00)
Calcium: 10 mg/dL (ref 8.9–10.3)
Chloride: 94 mmol/L — ABNORMAL LOW (ref 101–111)
GFR, EST AFRICAN AMERICAN: 4 mL/min — AB (ref >60)
GFR, EST NON AFRICAN AMERICAN: 3 mL/min — AB (ref >60)
Glucose, Bld: 200 mg/dL — ABNORMAL HIGH (ref 65–99)
POTASSIUM: 3.9 mmol/L (ref 3.5–5.1)
Sodium: 136 mmol/L (ref 135–145)
TOTAL PROTEIN: 7.7 g/dL (ref 6.5–8.1)

## 2016-12-16 LAB — HEMOGLOBIN A1C
HEMOGLOBIN A1C: 6.2 % — AB (ref 4.8–5.6)
MEAN PLASMA GLUCOSE: 131.24 mg/dL

## 2016-12-16 MED ORDER — SODIUM CHLORIDE 0.9 % IV SOLN
INTRAVENOUS | Status: AC
  Administered 2016-12-16: 09:00:00 via INTRAVENOUS

## 2016-12-16 MED ORDER — HEPARIN 1000 UNIT/ML FOR PERITONEAL DIALYSIS
500.0000 [IU] | INTRAMUSCULAR | Status: DC | PRN
  Filled 2016-12-16: qty 0.5

## 2016-12-16 MED ORDER — ONDANSETRON HCL 4 MG PO TABS: 4.0000 mg | ORAL_TABLET | Freq: Four times a day (QID) | ORAL | Status: DC | PRN

## 2016-12-16 MED ORDER — MAGNESIUM OXIDE 400 (241.3 MG) MG PO TABS
400.0000 mg | ORAL_TABLET | Freq: Every day | ORAL | Status: DC
  Administered 2016-12-16 – 2016-12-17 (×2): 400 mg via ORAL
  Filled 2016-12-16 (×2): qty 1

## 2016-12-16 MED ORDER — CALCIUM CARBONATE-VITAMIN D 500-200 MG-UNIT PO TABS
1.0000 | ORAL_TABLET | Freq: Every day | ORAL | Status: DC
  Administered 2016-12-16 – 2016-12-17 (×2): 1 via ORAL
  Filled 2016-12-16 (×2): qty 1

## 2016-12-16 MED ORDER — HYDROCODONE-ACETAMINOPHEN 5-325 MG PO TABS: 1.0000 | ORAL_TABLET | Freq: Four times a day (QID) | ORAL | Status: DC | PRN

## 2016-12-16 MED ORDER — DELFLEX-LC/1.5% DEXTROSE 344 MOSM/L IP SOLN
INTRAPERITONEAL | Status: DC
  Filled 2016-12-16: qty 3000

## 2016-12-16 MED ORDER — SODIUM CHLORIDE 0.9 % IV BOLUS (SEPSIS)
500.0000 mL | Freq: Once | INTRAVENOUS | Status: AC
  Administered 2016-12-16: 500 mL via INTRAVENOUS

## 2016-12-16 MED ORDER — ONDANSETRON HCL 4 MG/2ML IJ SOLN: 4.0000 mg | Freq: Four times a day (QID) | INTRAMUSCULAR | Status: DC | PRN

## 2016-12-16 MED ORDER — HEPARIN SODIUM (PORCINE) 5000 UNIT/ML IJ SOLN
5000.0000 [IU] | Freq: Three times a day (TID) | INTRAMUSCULAR | Status: DC
  Administered 2016-12-16 – 2016-12-17 (×4): 5000 [IU] via SUBCUTANEOUS
  Filled 2016-12-16 (×4): qty 1

## 2016-12-16 MED ORDER — GLIPIZIDE 5 MG PO TABS
5.0000 mg | ORAL_TABLET | ORAL | Status: DC
  Administered 2016-12-16: 5 mg via ORAL
  Filled 2016-12-16: qty 1

## 2016-12-16 MED ORDER — MECLIZINE HCL 25 MG PO TABS
25.0000 mg | ORAL_TABLET | Freq: Three times a day (TID) | ORAL | Status: DC | PRN
  Filled 2016-12-16: qty 1

## 2016-12-16 MED ORDER — INSULIN ASPART 100 UNIT/ML ~~LOC~~ SOLN
0.0000 [IU] | Freq: Three times a day (TID) | SUBCUTANEOUS | Status: DC
  Filled 2016-12-16: qty 1

## 2016-12-16 MED ORDER — SEVELAMER CARBONATE 800 MG PO TABS
1600.0000 mg | ORAL_TABLET | Freq: Three times a day (TID) | ORAL | Status: DC
  Administered 2016-12-16 – 2016-12-17 (×3): 1600 mg via ORAL
  Filled 2016-12-16 (×3): qty 2

## 2016-12-16 MED ORDER — ISOSORBIDE MONONITRATE ER 30 MG PO TB24
30.0000 mg | ORAL_TABLET | Freq: Every day | ORAL | Status: DC
  Administered 2016-12-16: 30 mg via ORAL
  Filled 2016-12-16: qty 1

## 2016-12-16 MED ORDER — ACETAMINOPHEN 325 MG PO TABS
650.0000 mg | ORAL_TABLET | Freq: Four times a day (QID) | ORAL | Status: DC | PRN
Start: 2016-12-16 — End: 2016-12-17
  Administered 2016-12-16: 650 mg via ORAL
  Filled 2016-12-16: qty 2

## 2016-12-16 MED ORDER — ALBUTEROL SULFATE (2.5 MG/3ML) 0.083% IN NEBU: 2.5000 mg | INHALATION_SOLUTION | Freq: Four times a day (QID) | RESPIRATORY_TRACT | Status: DC | PRN

## 2016-12-16 MED ORDER — INSULIN ASPART 100 UNIT/ML ~~LOC~~ SOLN: 0.0000 [IU] | Freq: Every day | SUBCUTANEOUS | Status: DC

## 2016-12-16 MED ORDER — SODIUM CHLORIDE 0.9% FLUSH
3.0000 mL | Freq: Two times a day (BID) | INTRAVENOUS | Status: DC
  Administered 2016-12-16 – 2016-12-17 (×3): 3 mL via INTRAVENOUS

## 2016-12-16 MED ORDER — FERROUS SULFATE 325 (65 FE) MG PO TABS
325.0000 mg | ORAL_TABLET | Freq: Two times a day (BID) | ORAL | Status: DC
  Administered 2016-12-16 – 2016-12-17 (×3): 325 mg via ORAL
  Filled 2016-12-16 (×6): qty 1

## 2016-12-16 MED ORDER — GENTAMICIN SULFATE 0.1 % EX CREA
1.0000 "application " | TOPICAL_CREAM | Freq: Every day | CUTANEOUS | Status: DC
  Administered 2016-12-17: 1 via TOPICAL
  Filled 2016-12-16: qty 15

## 2016-12-16 MED ORDER — SENNOSIDES-DOCUSATE SODIUM 8.6-50 MG PO TABS: 1.0000 | ORAL_TABLET | Freq: Every evening | ORAL | Status: DC | PRN

## 2016-12-16 MED ORDER — GABAPENTIN 100 MG PO CAPS
100.0000 mg | ORAL_CAPSULE | Freq: Three times a day (TID) | ORAL | Status: DC | PRN
  Administered 2016-12-17: 100 mg via ORAL
  Filled 2016-12-16: qty 1

## 2016-12-16 MED ORDER — ASPIRIN EC 81 MG PO TBEC
162.0000 mg | DELAYED_RELEASE_TABLET | Freq: Every day | ORAL | Status: DC
  Administered 2016-12-16: 162 mg via ORAL
  Filled 2016-12-16: qty 2

## 2016-12-16 MED ORDER — ACETAMINOPHEN 650 MG RE SUPP: 650.0000 mg | Freq: Four times a day (QID) | RECTAL | Status: DC | PRN

## 2016-12-16 MED ORDER — ALBUTEROL SULFATE HFA 108 (90 BASE) MCG/ACT IN AERS: 2.0000 | INHALATION_SPRAY | Freq: Four times a day (QID) | RESPIRATORY_TRACT | Status: DC | PRN

## 2016-12-16 NOTE — Care Management Note (Signed)
Case Management Note  Patient Details  Name: Jacqueline Rodriguez MRN: [id number] Date of Birth: [birth year]  Subjective/Objective:                 Placed in observation for syncope.  Has had home oxygen through [facility] and home health nursing services through [facility] in the past.  Chronic home peritoneal dialysis   Action/Plan:  [name] with [facility] notified of observation    Expected Discharge Date:                  Expected Discharge Plan:     In-House Referral:     Discharge planning Services     Post Acute Care Choice:    Choice offered to:     DME Arranged:    DME Agency:     HH Arranged:    [place] Agency:     Status of Service:     If discussed at [employer], dates discussed:    Additional Comments:  Katrina Stack, RN 12/16/2016, 9:06 AM

## 2016-12-16 NOTE — ED Triage Notes (Signed)
Pt arrived via EMS. EMS reports pt had multiple episodes of fainting at home when she got up to use the bathroom. Pt has a peritoneal dialysis port, and completes dialysis at home nightly. Pt currently in no obvious distress. Pt states she is restricted to 32oz fluid daily.

## 2016-12-16 NOTE — Care Management Obs Status (Signed)
[place] NOTIFICATION   Patient Details  Name: FIDENCIA MCCLOUD MRN: [id number] Date of Birth: 07/24/1960   Medicare Observation Status Notification Given:  Yes Notice signed, one given to patient and the other to HIM for scanning    Katrina Stack, RN 12/16/2016, 12:01 PM

## 2016-12-16 NOTE — Progress Notes (Signed)
[facility], [place] 12/16/16  Subjective:   Patient is known to our practice from outpatient dialysis.  She is followed by Dr. Juleen China as outpatient for peritoneal dialysis.  Her prescription includes 3000 cc for exchanges, 1 last fill of 1000 cc Patient states that she has been feeling lightheaded and dizzy.  She got up to go to bathroom but slumped over and passed out on the floor.  She does not remember the episode.  She was brought to the hospital for further evaluation   Objective:  Vital signs in last 24 hours:  Temp:  [97 F (36.1 C)-97.8 F (36.6 C)] 97.8 F (36.6 C) (12/06 1921) Pulse Rate:  [71-89] 81 (12/06 1921) Resp:  [14-22] 18 (12/06 1921) BP: (94-154)/(54-91) 154/54 (12/06 1921) SpO2:  [96 %-99 %] 98 % (12/06 1921) Weight:  [92.1 kg (203 lb)-94.9 kg (209 lb 4.8 oz)] 94.9 kg (209 lb 4.8 oz) (12/06 0946)  Weight change:  Filed Weights   12/16/16 0518 12/16/16 0946  Weight: 92.1 kg (203 lb) 94.9 kg (209 lb 4.8 oz)    Intake/Output:    Intake/Output Summary (Last 24 hours) at 12/16/2016 1934 Last data filed at 12/16/2016 1700 Gross per 24 hour  Intake 1320 ml  Output -  Net 1320 ml     Physical Exam: General:  No acute distress, laying in the bed  HEENT  anicteric, moist oral mucous membranes  Neck  supple  Pulm/lungs  normal breathing effort, clear to auscultation  CVS/Heart  regular rhythm, no rub or gallop, soft systolic murmur  Abdomen:   Soft, nontender,  Extremities:  No peripheral edema  Neurologic:  Alert, oriented  Skin:  No acute rashes  Access:  PD catheter in place       Basic Metabolic Panel:  Recent Labs  Lab 12/16/16 0555  NA 136  K 3.9  CL 94*  CO2 23  GLUCOSE 200*  BUN 48*  CREATININE 10.84*  CALCIUM 10.0     CBC: Recent Labs  Lab 12/16/16 0555  WBC 11.4*  NEUTROABS 9.1*  HGB 13.5  HCT 40.4  MCV 90.9  PLT 269      Lab Results  Component Value Date   HEPBSAG Negative 09/17/2016    HEPBSAB Non Reactive 09/17/2016      Microbiology:  No results found for this or any previous visit (from the past 240 hour(s)).  Coagulation Studies: No results for input(s): LABPROT, INR in the last 72 hours.  Urinalysis: No results for input(s): COLORURINE, LABSPEC, PHURINE, GLUCOSEU, HGBUR, BILIRUBINUR, KETONESUR, PROTEINUR, UROBILINOGEN, NITRITE, LEUKOCYTESUR in the last 72 hours.  Invalid input(s): APPERANCEUR    Imaging: Dg Chest Port 1 View  Result Date: 12/16/2016 CLINICAL DATA:  56 year old female with syncope. EXAM: PORTABLE CHEST 1 VIEW COMPARISON:  Chest radiograph dated 12/03/2016 FINDINGS: There has been interval removal of the right IJ dialysis catheter. Left lung base atelectatic changes versus infiltrate. A small left pleural effusion may be present. The right lung is clear. No pneumothorax. Stable mild cardiomegaly. No acute osseous pathology. IMPRESSION: Left lung base atelectasis versus infiltrate. Possible small left pleural effusion. Electronically Signed   By: Anner Crete M.D.   On: 12/16/2016 06:18     Medications:   . sodium chloride 75 mL/hr at 12/16/16 1700  . dialysis solution 1.5% low-MG/low-CA Stopped (12/16/16 1343)   . aspirin EC  162 mg Oral QHS  . calcium-vitamin D  1 tablet Oral Daily  . ferrous sulfate  325  mg Oral BID  . gentamicin cream  1 application Topical Daily  . heparin  5,000 Units Subcutaneous Q8H  . insulin aspart  0-5 Units Subcutaneous QHS  . insulin aspart  0-9 Units Subcutaneous TID WC  . magnesium oxide  400 mg Oral Daily  . sevelamer carbonate  1,600 mg Oral TID WC  . sodium chloride flush  3 mL Intravenous Q12H   acetaminophen **OR** acetaminophen, albuterol, gabapentin, heparin, HYDROcodone-acetaminophen, meclizine, ondansetron **OR** ondansetron (ZOFRAN) IV, senna-docusate  Assessment/ Plan:  56 y.o.caucasian female  with diastolic congestive heart failure, hypertension, diabetes mellitus type II, diabetic  retinopathy, diabetic neuropathy, ESRD on PD, anemia of CKD, SHPTH admitted now with syncope.   [facility]/PD/Dr Kolluru  1.  Syncope.  2.  Orthostatic hypotension. 3.  Diabetes mellitus type 2 with chronic kidney disease 4.  ESRD  Consult received to evaluate for dialysis.  Patient is under evaluation for syncope and orthostatic hypotension.  Patient was using higher strength PD fluids at home. We will do her peritoneal dialysis with 1.5% dialysate which will not remove any fluid.  Continue to monitor closely.  Agree with gentle IV hydration Use of different strengths of PD bags was reviewed with the patient.     LOS: 0 [facility] 12/6/20187:34 PM  [facility] [place], [place]

## 2016-12-16 NOTE — ED Notes (Addendum)
Attempted to call report unable to take report at this time.

## 2016-12-16 NOTE — Plan of Care (Signed)
  Health Behavior/Discharge Planning: Ability to manage health-related needs will improve 12/16/2016 1531 - Progressing by Daron Offer, RN Note Agreeable to PD session tonight. Wenda Low [facility]

## 2016-12-16 NOTE — Plan of Care (Signed)
Pt is A&Ox4. VSS. RA . Pt to get PD tonight. No complaints thus far. Will continue to monitor and report to oncoming RN .

## 2016-12-16 NOTE — Progress Notes (Addendum)
The patient was admitted for syncope episode due to orthostatic hypotension. Her vital signs are stable, but blood pressure is in the low side at 94/54 this p.m. She complains of dizziness while sit up or stand. Physical examination is unremarkable.  1. Syncope due to Orthostatic hypotension.  Gentle normal saline IV.  Follow-up vital signs.  Hold imdur. 3. Chronic kidney disease on peritoneal dialysis.  Continue PT per Dr. Candiss Norse. 4.  Chronic Diastolic heart failure.  Stable. 5. Diabetes mellitus, hold the glipizide and start sliding scale.  Discussed with Dr. Candiss Norse. Time spent about 28 minutes.

## 2016-12-16 NOTE — ED Provider Notes (Signed)
[facility] Emergency Department Provider Note   ____________________________________________   First MD Initiated Contact with Patient 12/16/16 0533     (approximate)  I have reviewed the triage vital signs and the nursing notes.   HISTORY  Chief Complaint Loss of Consciousness    HPI Jacqueline Rodriguez is a 56 y.o. female brought to the ED from home via EMS with a chief complaint of syncope.  Patient does peritoneal dialysis at home overnight; states she does 3 bags per night.  Today she stood up to use the restroom and fainted.  Spouse told her she fainted a second time when she tried to get back up. She was hospitalized approximately 3 weeks ago for similar episode.  States she did well when her nephrologist backed her down to 1.5 bags; but he subsequently increased her back to 3 bags of diasylate.  Patient denies recent fever, chills, chest pain, shortness of breath, abdominal pain, nausea or vomiting.  Denies recent travel or trauma.  Standing makes her lightheaded and dizzy.   Past Medical History:  Diagnosis Date  . Anemia   . Chronic bronchitis ([place])   . Chronic diastolic CHF (congestive heart failure) ([place])   . CKD (chronic kidney disease), stage III ([place])   . COPD (chronic obstructive pulmonary disease) ([place])   . Diabetes mellitus with renal complications ([place])   . Essential hypertension   . GERD (gastroesophageal reflux disease)   . Obesity   . Peripheral vascular disease ([place])   . Pulmonary hypertension ([place])   . Renal insufficiency 09/19/2015   Stage 3 CKD. Beginning dialysis.    Patient Active Problem List   Diagnosis Date Noted  . Peritoneal dialysis catheter exit site infection ([place]) 10/05/2016  . 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 Hypocalcemia 09/14/2016  . Hyperkalemia 09/13/2016  . Syncope 09/08/2016  . ESRD on dialysis ([place]) 05/25/2016  . Chronic diastolic heart failure ([place]) 07/30/2015  . Acute renal failure  superimposed on stage 3 chronic kidney disease ([place]) 07/18/2015  . Diabetes mellitus with renal complications ([place])   . CKD (chronic kidney disease), stage III ([place])   . Pulmonary hypertension ([place])   . Obesity   . Morbid obesity due to excess calories ([place])   . Anemia in chronic renal disease   . Shortness of breath   . Back pain 05/01/2015  . Essential hypertension 03/25/2015  . Diabetes ([place]) 03/25/2015    Past Surgical History:  Procedure Laterality Date  . A/V FISTULAGRAM N/A 09/16/2016   Procedure: A/V Fistulagram;  Surgeon: Algernon Huxley, MD;  Location: [facility];  Service: Cardiovascular;  Laterality: N/A;  . AV FISTULA PLACEMENT Left 10/01/2015   Procedure: ARTERIOVENOUS (AV) FISTULA CREATION ( BRACHIAL CEPHALIC );  Surgeon: Algernon Huxley, MD;  Location: [facility];  Service: Vascular;  Laterality: Left;  . CAPD INSERTION N/A 06/10/2016   Procedure: LAPAROSCOPIC INSERTION CONTINUOUS AMBULATORY PERITONEAL DIALYSIS  (CAPD) CATHETER;  Surgeon: Algernon Huxley, MD;  Location: [facility];  Service: Vascular;  Laterality: N/A;  . DIALYSIS/PERMA CATHETER INSERTION  09/16/2016   Procedure: DIALYSIS/PERMA CATHETER INSERTION;  Surgeon: Algernon Huxley, MD;  Location: [facility];  Service: Cardiovascular;;  . DIALYSIS/PERMA CATHETER REMOVAL N/A 12/06/2016   Procedure: DIALYSIS/PERMA CATHETER REMOVAL;  Surgeon: Algernon Huxley, MD;  Location: [facility];  Service: Cardiovascular;  Laterality: N/A;  . PERIPHERAL VASCULAR CATHETERIZATION N/A 07/21/2015   Procedure: Dialysis/Perma Catheter;  Surgeon: Algernon Huxley, MD;  Location: [facility];  Service: Cardiovascular;  Laterality: N/A;  . PERIPHERAL VASCULAR CATHETERIZATION N/A 12/11/2015   Procedure: Dialysis/Perma Catheter Removal;  Surgeon: Algernon Huxley, MD;  Location: [facility];  Service: Cardiovascular;  Laterality: N/A;    Prior to Admission medications   Medication Sig Start Date End Date Taking? Authorizing  Provider  acetaminophen (TYLENOL) 500 MG tablet Take 500 mg by mouth every 6 (six) hours as needed.   Yes [provider]  albuterol (PROVENTIL HFA;VENTOLIN HFA) 108 (90 Base) MCG/ACT inhaler Inhale 2 puffs into the lungs every 6 (six) hours as needed for wheezing or shortness of breath. 01/08/15  Yes Gregor Hams, MD  aspirin EC 81 MG EC tablet Take 1 tablet (81 mg total) by mouth daily. Patient taking differently: Take 162 mg by mouth at bedtime.  03/20/15  Yes Vaughan Basta, MD  Calcium-Magnesium-Vitamin D (CALCIUM 1200+D3 PO) Take 1 tablet by mouth daily.   Yes [provider]  cetirizine (ZYRTEC) 10 MG tablet Take 10 mg by mouth daily as needed for allergies.   Yes [provider]  ferrous sulfate 325 (65 FE) MG EC tablet Take 1 tablet (325 mg total) by mouth 2 (two) times daily. 09/17/16 09/17/17 Yes Wieting, Richard, MD  furosemide (LASIX) 40 MG tablet Take 1 tablet (40 mg total) by mouth daily. 09/17/16  Yes Wieting, Richard, MD  gabapentin (NEURONTIN) 100 MG capsule Take 100 mg by mouth 3 (three) times daily as needed. For neuropathy. 03/05/15  Yes [provider]  glipiZIDE (GLUCOTROL) 5 MG tablet Take 5 mg by mouth daily.   Yes [provider]  GLUCOSAMINE-CHONDROITIN PO Take 4,000 mg by mouth daily.   Yes [provider]  HYDROcodone-acetaminophen (NORCO) 5-325 MG tablet Take 1 tablet by mouth every 6 (six) hours as needed for moderate pain. 06/10/16  Yes Algernon Huxley, MD  isosorbide mononitrate (IMDUR) 30 MG 24 hr tablet Take 30 mg by mouth daily.   Yes [provider]  magnesium oxide (MAG-OX) 400 (241.3 Mg) MG tablet Take 1 tablet (400 mg total) by mouth daily. 09/17/16  Yes Wieting, Richard, MD  meclizine (ANTIVERT) 25 MG tablet Take 1 tablet (25 mg total) by mouth 3 (three) times daily as needed for dizziness. 12/08/15  Yes Nance Pear, MD  ondansetron (ZOFRAN) 4 MG tablet Take 1 tablet (4 mg total) by mouth every  6 (six) hours as needed for nausea. 09/17/16  Yes Wieting, Richard, MD  sevelamer carbonate (RENVELA) 800 MG tablet Take 1,600 mg by mouth 3 (three) times daily with meals.   Yes [provider]  azithromycin (ZITHROMAX) 250 MG tablet One tablet daily as per DR Ola Spurr infectious disease Patient not taking: Reported on 12/06/2016 09/17/16   Loletha Grayer, MD  calcium acetate (PHOSLO) 667 MG capsule Take 3 capsules (2,001 mg total) by mouth 3 (three) times daily with meals. Patient not taking: Reported on 12/16/2016 09/17/16   Loletha Grayer, MD  hydrALAZINE (APRESOLINE) 10 MG tablet Take 1 tablet (10 mg total) by mouth 3 (three) times daily. 12/04/16   Vaughan Basta, MD  tobramycin 160 mg in dextrose 5 % 50 mL Inject 160 mg into the vein every Monday, Wednesday, and Friday at 6 PM. Patient not taking: Reported on 12/16/2016 09/17/16   Loletha Grayer, MD    Allergies Ambien [zolpidem]  Family History  Problem Relation Age of Onset  . Hypertension Mother   . Diabetes Mellitus II Mother   .  CAD Father   . Hypertension Father   . Heart attack Father     Social History Social History   Tobacco Use  . Smoking status: Former Smoker    Types: Cigarettes    Last attempt to quit: 03/24/1985    Years since quitting: 31.7  . Smokeless tobacco: Never Used  Substance Use Topics  . Alcohol use: No    Alcohol/week: 0.0 oz  . Drug use: No    Review of Systems  Constitutional: No fever/chills. Eyes: No visual changes. ENT: No sore throat. Cardiovascular: Denies chest pain. Respiratory: Denies shortness of breath. Gastrointestinal: No abdominal pain.  No nausea, no vomiting.  No diarrhea.  No constipation. Genitourinary: Negative for dysuria. Musculoskeletal: Negative for back pain. Skin: Negative for rash. Neurological: Positive for lightheadedness and fainting.  Negative for headaches, focal weakness or  numbness.   ____________________________________________   PHYSICAL EXAM:  VITAL SIGNS: ED Triage Vitals  Enc Vitals Group     BP 12/16/16 0517 (!) 150/91     Pulse Rate 12/16/16 0517 85     Resp 12/16/16 0517 (!) 22     Temp --      Temp src --      SpO2 12/16/16 0507 96 %     Weight 12/16/16 0518 203 lb (92.1 kg)     Height 12/16/16 0518 5\' 9"  (1.753 m)     Head Circumference --      Peak Flow --      Pain Score --      Pain Loc --      Pain Edu? --      Excl. in [place]? --     Constitutional: Alert and oriented. Well appearing and in no acute distress. Eyes: Conjunctivae are normal. PERRL. EOMI. Head: Atraumatic. Nose: No congestion/rhinnorhea. Mouth/Throat: Mucous membranes are moist.  Oropharynx non-erythematous. Neck: No stridor.  No carotid bruits. Cardiovascular: Normal rate, regular rhythm. Grossly normal heart sounds.  Good peripheral circulation. Respiratory: Normal respiratory effort.  No retractions. Lungs CTAB. Gastrointestinal: Soft and nontender. No distention. No abdominal bruits. No CVA tenderness.  PD insertion site clean/dry/intact. Musculoskeletal: No lower extremity tenderness nor edema.  No joint effusions. Neurologic:  Normal speech and language. No gross focal neurologic deficits are appreciated.  Skin:  Skin is warm, dry and intact. No rash noted. Psychiatric: Mood and affect are normal. Speech and behavior are normal.  ____________________________________________   LABS (all labs ordered are listed, but only abnormal results are displayed)  Labs Reviewed  CBC WITH DIFFERENTIAL/PLATELET - Abnormal; Notable for the following components:      Result Value   WBC 11.4 (*)    RDW 16.0 (*)    Neutro Abs 9.1 (*)    All other components within normal limits  COMPREHENSIVE METABOLIC PANEL - Abnormal; Notable for the following components:   Chloride 94 (*)    Glucose, Bld 200 (*)    BUN 48 (*)    Creatinine, Ser 10.84 (*)    GFR calc non Af Amer 3  (*)    GFR calc Af Amer 4 (*)    Anion gap 19 (*)    All other components within normal limits  TROPONIN I - Abnormal; Notable for the following components:   Troponin I 0.07 (*)    All other components within normal limits  URINALYSIS, COMPLETE (UACMP) WITH MICROSCOPIC   ____________________________________________  EKG  ED ECG REPORT I, Karna Abed J, the attending physician, personally viewed and interpreted this ECG.  Date: 12/16/2016  EKG Time: 0515  Rate: 83  Rhythm: normal EKG, normal sinus rhythm  Axis: LAD  Intervals:left anterior fascicular block  ST&T Change: Nonspecific  ____________________________________________  RADIOLOGY  Dg Chest Port 1 View  Result Date: 12/16/2016 CLINICAL DATA:  56 year old female with syncope. EXAM: PORTABLE CHEST 1 VIEW COMPARISON:  Chest radiograph dated 12/03/2016 FINDINGS: There has been interval removal of the right IJ dialysis catheter. Left lung base atelectatic changes versus infiltrate. A small left pleural effusion may be present. The right lung is clear. No pneumothorax. Stable mild cardiomegaly. No acute osseous pathology. IMPRESSION: Left lung base atelectasis versus infiltrate. Possible small left pleural effusion. Electronically Signed   By: Anner Crete M.D.   On: 12/16/2016 06:18    ____________________________________________   PROCEDURES  Procedure(s) performed: None  Procedures  Critical Care performed: No  ____________________________________________   INITIAL IMPRESSION / ASSESSMENT AND PLAN / ED COURSE  As part of my medical decision making, I reviewed the following data within the [place] notes reviewed and incorporated, Labs reviewed, EKG interpreted, Old chart reviewed, Discussed with admitting physician and Notes from prior ED visits.   56 year old female with end-stage renal disease on peritoneal dialysis which was recently started, presenting with multiple syncopal  episodes.  Differential diagnosis includes but is not limited to volume depletion, orthostasis, cardiac, neurologic, metabolic etiologies.  Patient was found to be quite orthostatic and became dizzy on standing.  Will infuse judicious IV fluids, check screening lab work including urinalysis.  Anticipate hospitalization.  Clinical Course as of Dec 17 646  Thu Dec 16, 2016  0647 Updated patient and spouse of laboratory and imaging results.  Patient is not complaining of cough or fever; doubt pneumonia.  Patient was finishing her peritoneal dialysis when she had her syncopal episode so her husband disconnected her.  He states she still has diasylate and would like it drained off. Will contact dialysis unit to see if a nurse can assist.  [JS]    Clinical Course User Index [JS] Paulette Blanch, MD     ____________________________________________   FINAL CLINICAL IMPRESSION(S) / ED DIAGNOSES  Final diagnoses:  Dehydration  Orthostasis  Syncope, unspecified syncope type  Elevated troponin     ED Discharge Orders    None       Note:  This document was prepared using Dragon voice recognition software and may include unintentional dictation errors.    Paulette Blanch, MD 12/16/16 [phone]

## 2016-12-16 NOTE — Progress Notes (Signed)
Patient connected to peritoneal dialysis cycler without complications.

## 2016-12-16 NOTE — ED Notes (Signed)
Pt taken to room 240 by this RN. No belongings taken with patient; all sent home with husband. Pt into hospital bed with no problems. Tech at bedside.

## 2016-12-16 NOTE — ED Notes (Signed)
Report to Steven, RN

## 2016-12-16 NOTE — Progress Notes (Signed)
Inpatient Diabetes Program Recommendations  AACE/ADA: New Consensus Statement on Inpatient Glycemic Control (2015)  Target Ranges:  Prepandial:   less than 140 mg/dL      Peak postprandial:   less than 180 mg/dL (1-2 hours)      Critically ill patients:  140 - 180 mg/dL   Lab Results  Component Value Date   GLUCAP 114 (H) 12/06/2016   HGBA1C 6.0 (H) 12/03/2016    Review of Glycemic Control  No finger stick blood sugars available- plasma glucose 200mg /dl this a   Diabetes history: Type 2 Outpatient Diabetes medications: Glipizide 5mg  qam  Current orders for Inpatient glycemic control: Glucotrol 5mg  qam  Inpatient Diabetes Program Recommendations:  Please d/c Glucotrol while patient is in the hospital- consider not re-ordering at discharge as it is excreted from the kidney and poses a high risk for hypoglycemia  Please order CBG checks tid and hs with Novolog 0-9 units tid.  Gentry Fitz, RN, BA, MHA, CDE Diabetes Coordinator Inpatient Diabetes Program  [phone] (Team Pager) [phone] ([place]) 12/16/2016 10:55 AM

## 2016-12-16 NOTE — H&P (Signed)
[facility] [place] [facility] NAME: Jacqueline Rodriguez    MR#:  [id number]  DATE OF BIRTH:  [birth year]  DATE OF ADMISSION:  12/16/2016  PRIMARY CARE PHYSICIAN: Hortencia Pilar, MD   REQUESTING/REFERRING PHYSICIAN:   CHIEF COMPLAINT:   Chief Complaint  Patient presents with  . Loss of Consciousness    HISTORY OF PRESENT ILLNESS: Jacqueline Rodriguez  is a 56 y.o. female with a known history of chronic kidney disease on peritoneal dialysis, chronic diastolic heart failure, bronchitis, diabetes mellitus, hypertension, GERD presented to the emergency room for passing out. Patient passed out this morning when she got up from the bed. Patient felt weak and dizzy and lightheaded. Whenever she does peritoneal dialysis and removes 3 bags of dialysate refills dizzy and lightheaded and passed out. She has been seen nephrology in the clinic. No complaints of any chest pain, shortness of breath she was evaluated in the emergency room and appeared to be dry and dehydrated and started on gentle IV fluids. Patients orthostatic blood pressure was checked in the emergency room and was low.  PAST MEDICAL HISTORY:   Past Medical History:  Diagnosis Date  . Anemia   . Chronic bronchitis ([place])   . Chronic diastolic CHF (congestive heart failure) ([place])   . CKD (chronic kidney disease), stage III ([place])   . COPD (chronic obstructive pulmonary disease) ([place])   . Diabetes mellitus with renal complications ([place])   . Essential hypertension   . GERD (gastroesophageal reflux disease)   . Obesity   . Peripheral vascular disease ([place])   . Pulmonary hypertension ([place])   . Renal insufficiency 09/19/2015   Stage 3 CKD. Beginning dialysis.    PAST SURGICAL HISTORY:  Past Surgical History:  Procedure Laterality Date  . A/V FISTULAGRAM N/A 09/16/2016   Procedure: A/V Fistulagram;  Surgeon: Algernon Huxley, MD;  Location: [facility];  Service: Cardiovascular;  Laterality: N/A;  . AV  FISTULA PLACEMENT Left 10/01/2015   Procedure: ARTERIOVENOUS (AV) FISTULA CREATION ( BRACHIAL CEPHALIC );  Surgeon: Algernon Huxley, MD;  Location: [facility];  Service: Vascular;  Laterality: Left;  . CAPD INSERTION N/A 06/10/2016   Procedure: LAPAROSCOPIC INSERTION CONTINUOUS AMBULATORY PERITONEAL DIALYSIS  (CAPD) CATHETER;  Surgeon: Algernon Huxley, MD;  Location: [facility];  Service: Vascular;  Laterality: N/A;  . DIALYSIS/PERMA CATHETER INSERTION  09/16/2016   Procedure: DIALYSIS/PERMA CATHETER INSERTION;  Surgeon: Algernon Huxley, MD;  Location: [facility];  Service: Cardiovascular;;  . DIALYSIS/PERMA CATHETER REMOVAL N/A 12/06/2016   Procedure: DIALYSIS/PERMA CATHETER REMOVAL;  Surgeon: Algernon Huxley, MD;  Location: [facility];  Service: Cardiovascular;  Laterality: N/A;  . PERIPHERAL VASCULAR CATHETERIZATION N/A 07/21/2015   Procedure: Dialysis/Perma Catheter;  Surgeon: Algernon Huxley, MD;  Location: [facility];  Service: Cardiovascular;  Laterality: N/A;  . PERIPHERAL VASCULAR CATHETERIZATION N/A 12/11/2015   Procedure: Dialysis/Perma Catheter Removal;  Surgeon: Algernon Huxley, MD;  Location: [place] [facility];  Service: Cardiovascular;  Laterality: N/A;    SOCIAL HISTORY:  Social History   Tobacco Use  . Smoking status: Former Smoker    Types: Cigarettes    Last attempt to quit: 03/24/1985    Years since quitting: 31.7  . Smokeless tobacco: Never Used  Substance Use Topics  . Alcohol use: No    Alcohol/week: 0.0 oz    FAMILY HISTORY:  Family History  Problem Relation Age of Onset  . Hypertension  Mother   . Diabetes Mellitus II Mother   . CAD Father   . Hypertension Father   . Heart attack Father     DRUG ALLERGIES:  Allergies  Allergen Reactions  . Ambien [Zolpidem] Other (See Comments)    hallucinations    REVIEW OF SYSTEMS:   CONSTITUTIONAL: No fever, fatigue or weakness.  EYES: No blurred or double vision.  EARS, NOSE, AND THROAT: No tinnitus or  ear pain.  RESPIRATORY: No cough, shortness of breath, wheezing or hemoptysis.  CARDIOVASCULAR: No chest pain, orthopnea, edema.  GASTROINTESTINAL: No nausea, vomiting, diarrhea or abdominal pain.  GENITOURINARY: No dysuria, hematuria.  ENDOCRINE: No polyuria, nocturia,  HEMATOLOGY: No anemia, easy bruising or bleeding SKIN: No rash or lesion. MUSCULOSKELETAL: No joint pain or arthritis.   NEUROLOGIC: No tingling, numbness, weakness.  Light headed and dizzy PSYCHIATRY: No anxiety or depression.   MEDICATIONS AT HOME:  Prior to Admission medications   Medication Sig Start Date End Date Taking? Authorizing Provider  acetaminophen (TYLENOL) 500 MG tablet Take 500 mg by mouth every 6 (six) hours as needed.   Yes [provider]  albuterol (PROVENTIL HFA;VENTOLIN HFA) 108 (90 Base) MCG/ACT inhaler Inhale 2 puffs into the lungs every 6 (six) hours as needed for wheezing or shortness of breath. 01/08/15  Yes Gregor Hams, MD  aspirin EC 81 MG EC tablet Take 1 tablet (81 mg total) by mouth daily. Patient taking differently: Take 162 mg by mouth at bedtime.  03/20/15  Yes Vaughan Basta, MD  Calcium-Magnesium-Vitamin D (CALCIUM 1200+D3 PO) Take 1 tablet by mouth daily.   Yes [provider]  cetirizine (ZYRTEC) 10 MG tablet Take 10 mg by mouth daily as needed for allergies.   Yes [provider]  ferrous sulfate 325 (65 FE) MG EC tablet Take 1 tablet (325 mg total) by mouth 2 (two) times daily. 09/17/16 09/17/17 Yes Wieting, Richard, MD  furosemide (LASIX) 40 MG tablet Take 1 tablet (40 mg total) by mouth daily. 09/17/16  Yes Wieting, Richard, MD  gabapentin (NEURONTIN) 100 MG capsule Take 100 mg by mouth 3 (three) times daily as needed. For neuropathy. 03/05/15  Yes [provider]  glipiZIDE (GLUCOTROL) 5 MG tablet Take 5 mg by mouth daily.   Yes [provider]  GLUCOSAMINE-CHONDROITIN PO Take 4,000 mg by mouth daily.   Yes [provider]   HYDROcodone-acetaminophen (NORCO) 5-325 MG tablet Take 1 tablet by mouth every 6 (six) hours as needed for moderate pain. 06/10/16  Yes Algernon Huxley, MD  isosorbide mononitrate (IMDUR) 30 MG 24 hr tablet Take 30 mg by mouth daily.   Yes [provider]  magnesium oxide (MAG-OX) 400 (241.3 Mg) MG tablet Take 1 tablet (400 mg total) by mouth daily. 09/17/16  Yes Wieting, Richard, MD  meclizine (ANTIVERT) 25 MG tablet Take 1 tablet (25 mg total) by mouth 3 (three) times daily as needed for dizziness. 12/08/15  Yes Nance Pear, MD  ondansetron (ZOFRAN) 4 MG tablet Take 1 tablet (4 mg total) by mouth every 6 (six) hours as needed for nausea. 09/17/16  Yes Wieting, Richard, MD  sevelamer carbonate (RENVELA) 800 MG tablet Take 1,600 mg by mouth 3 (three) times daily with meals.   Yes [provider]  azithromycin (ZITHROMAX) 250 MG tablet One tablet daily as per DR Ola Spurr infectious disease Patient not taking: Reported on 12/06/2016 09/17/16   Loletha Grayer, MD  calcium acetate (PHOSLO) 667 MG capsule Take 3 capsules (2,001 mg  total) by mouth 3 (three) times daily with meals. Patient not taking: Reported on 12/16/2016 09/17/16   Loletha Grayer, MD  hydrALAZINE (APRESOLINE) 10 MG tablet Take 1 tablet (10 mg total) by mouth 3 (three) times daily. 12/04/16   Vaughan Basta, MD  tobramycin 160 mg in dextrose 5 % 50 mL Inject 160 mg into the vein every Monday, Wednesday, and Friday at 6 PM. Patient not taking: Reported on 12/16/2016 09/17/16   Loletha Grayer, MD      PHYSICAL EXAMINATION:   VITAL SIGNS: Blood pressure (!) 150/91, pulse 85, temperature (!) 97 F (36.1 C), temperature source Axillary, resp. rate (!) 22, height 5\' 9"  (1.753 m), weight 92.1 kg (203 lb), last menstrual period 09/18/2013, SpO2 99 %.  GENERAL:  56 y.o.-year-old patient lying in the bed with no acute distress.  EYES: Pupils equal, round, reactive to light and accommodation. No scleral icterus.  Extraocular muscles intact.  HEENT: Head atraumatic, normocephalic. Oropharynx cry and nasopharynx clear.  NECK:  Supple, no jugular venous distention. No thyroid enlargement, no tenderness.  LUNGS: Normal breath sounds bilaterally, no wheezing, rales,rhonchi or crepitation. No use of accessory muscles of respiration.  CARDIOVASCULAR: S1, S2 normal. No murmurs, rubs, or gallops.  ABDOMEN: Soft, nontender, nondistended. Bowel sounds present. No organomegaly or mass.  EXTREMITIES: No pedal edema, cyanosis, or clubbing.  NEUROLOGIC: Cranial nerves II through XII are intact. Muscle strength 5/5 in all extremities. Sensation intact. Gait not checked.  PSYCHIATRIC: The patient is alert and oriented x 3.  SKIN: No obvious rash, lesion, or ulcer.   LABORATORY PANEL:   CBC Recent Labs  Lab 12/16/16 0555  WBC 11.4*  HGB 13.5  HCT 40.4  PLT 269  MCV 90.9  MCH 30.5  MCHC 33.5  RDW 16.0*  LYMPHSABS 1.2  MONOABS 0.8  EOSABS 0.2  BASOSABS 0.1   ------------------------------------------------------------------------------------------------------------------  Chemistries  Recent Labs  Lab 12/16/16 0555  NA 136  K 3.9  CL 94*  CO2 23  GLUCOSE 200*  BUN 48*  CREATININE 10.84*  CALCIUM 10.0  AST 38  ALT 25  ALKPHOS 74  BILITOT 0.8   ------------------------------------------------------------------------------------------------------------------ estimated creatinine clearance is 6.8 mL/min (A) (by C-G formula based on SCr of 10.84 mg/dL (H)). ------------------------------------------------------------------------------------------------------------------ No results for input(s): TSH, T4TOTAL, T3FREE, THYROIDAB in the last 72 hours.  Invalid input(s): FREET3   Coagulation profile No results for input(s): INR, PROTIME in the last 168 hours. ------------------------------------------------------------------------------------------------------------------- No results for  input(s): DDIMER in the last 72 hours. -------------------------------------------------------------------------------------------------------------------  Cardiac Enzymes Recent Labs  Lab 12/16/16 0555  TROPONINI 0.07*   ------------------------------------------------------------------------------------------------------------------ Invalid input(s): POCBNP  ---------------------------------------------------------------------------------------------------------------  Urinalysis    Component Value Date/Time   COLORURINE YELLOW (A) 12/03/2016 1059   APPEARANCEUR CLOUDY (A) 12/03/2016 1059   APPEARANCEUR Hazy 12/29/2013 2030   LABSPEC 1.016 12/03/2016 1059   LABSPEC 1.028 12/29/2013 2030   PHURINE 5.0 12/03/2016 1059   GLUCOSEU >=500 (A) 12/03/2016 1059   GLUCOSEU >=500 12/29/2013 2030   HGBUR SMALL (A) 12/03/2016 1059   BILIRUBINUR NEGATIVE 12/03/2016 1059   BILIRUBINUR Negative 12/29/2013 2030   KETONESUR NEGATIVE 12/03/2016 1059   PROTEINUR >=300 (A) 12/03/2016 1059   NITRITE NEGATIVE 12/03/2016 1059   LEUKOCYTESUR TRACE (A) 12/03/2016 1059   LEUKOCYTESUR Negative 12/29/2013 2030     RADIOLOGY: Dg Chest Port 1 View  Result Date: 12/16/2016 CLINICAL DATA:  55 year old female with syncope. EXAM: PORTABLE CHEST 1 VIEW COMPARISON:  Chest radiograph dated 12/03/2016 FINDINGS: There has been interval removal of  the right IJ dialysis catheter. Left lung base atelectatic changes versus infiltrate. A small left pleural effusion may be present. The right lung is clear. No pneumothorax. Stable mild cardiomegaly. No acute osseous pathology. IMPRESSION: Left lung base atelectasis versus infiltrate. Possible small left pleural effusion. Electronically Signed   By: Anner Crete M.D.   On: 12/16/2016 06:18    EKG: Orders placed or performed during the hospital encounter of 12/16/16  . EKG 12-Lead  . EKG 12-Lead    IMPRESSION AND PLAN: 56 year old female patient with history  of chronic kidney disease on peritoneal atelectasis, chronic diastolic heart failure, COPD, diabetes mellitus, hypertension, peripheral vascular disease presented to the emergency room with lightheadedness and dizziness and after passing out.  Admitting diagnosis 1. Syncope 2. Orthostatic hypotension 3. Chronic kidney disease on peritoneal dialysis 4. Diastolic heart failure 5. Diabetes mellitus Treatment plan Admit patient to telemetry IV fluids Cycle troponin Check echocardiogram Nephrology consultation  All the records are reviewed and case discussed with ED provider. Management plans discussed with the patient, family and they are in agreement.  CODE STATUS:FULL CODE Code Status History    Date Active Date Inactive Code Status Order ID Comments User Context   12/03/2016 12:01 12/04/2016 18:50 Full Code [id number]  Max Sane, MD Inpatient   09/13/2016 16:29 09/17/2016 18:17 Full Code [id number]  Epifanio Lesches, MD ED   09/08/2016 16:31 09/09/2016 18:23 Full Code [id number]  Fritzi Mandes, MD Inpatient   07/17/2015 12:44 07/24/2015 19:11 Full Code [id number]  Epifanio Lesches, MD ED   03/15/2015 19:56 03/20/2015 15:48 Full Code [id number]  Idelle Crouch, MD Inpatient       TOTAL TIME TAKING CARE OF THIS PATIENT: 50 minutes.    Saundra Shelling M.D on 12/16/2016 at 6:57 AM  Between 7am to 6pm - Pager - [phone]  After 6pm go to [email or web address] - password [email or web address] [facility]  Office  [phone]  CC: Primary care physician; Hortencia Pilar, MD

## 2016-12-17 DIAGNOSIS — I509 Heart failure, unspecified: Secondary | ICD-10-CM | POA: Diagnosis not present

## 2016-12-17 DIAGNOSIS — E860 Dehydration: Secondary | ICD-10-CM | POA: Diagnosis not present

## 2016-12-17 DIAGNOSIS — N183 Chronic kidney disease, stage 3 (moderate): Secondary | ICD-10-CM | POA: Diagnosis not present

## 2016-12-17 DIAGNOSIS — E1129 Type 2 diabetes mellitus with other diabetic kidney complication: Secondary | ICD-10-CM | POA: Diagnosis not present

## 2016-12-17 DIAGNOSIS — N186 End stage renal disease: Secondary | ICD-10-CM | POA: Diagnosis not present

## 2016-12-17 DIAGNOSIS — I951 Orthostatic hypotension: Secondary | ICD-10-CM | POA: Diagnosis not present

## 2016-12-17 DIAGNOSIS — Z992 Dependence on renal dialysis: Secondary | ICD-10-CM | POA: Diagnosis not present

## 2016-12-17 DIAGNOSIS — D509 Iron deficiency anemia, unspecified: Secondary | ICD-10-CM | POA: Diagnosis not present

## 2016-12-17 DIAGNOSIS — R55 Syncope and collapse: Secondary | ICD-10-CM | POA: Diagnosis not present

## 2016-12-17 LAB — GLUCOSE, CAPILLARY: GLUCOSE-CAPILLARY: 109 mg/dL — AB (ref 65–99)

## 2016-12-17 MED ORDER — HYDRALAZINE HCL 20 MG/ML IJ SOLN: 10.0000 mg | Freq: Four times a day (QID) | INTRAMUSCULAR | Status: DC | PRN

## 2016-12-17 MED ORDER — HYDRALAZINE HCL 10 MG PO TABS
10.0000 mg | ORAL_TABLET | Freq: Three times a day (TID) | ORAL | Status: DC
  Administered 2016-12-17: 10 mg via ORAL
  Filled 2016-12-17 (×2): qty 1

## 2016-12-17 MED ORDER — MUPIROCIN 2 % EX OINT
TOPICAL_OINTMENT | Freq: Every day | CUTANEOUS | Status: DC
  Administered 2016-12-17: 12:00:00 via TOPICAL
  Filled 2016-12-17: qty 22

## 2016-12-17 MED ORDER — ISOSORBIDE MONONITRATE ER 30 MG PO TB24
30.0000 mg | ORAL_TABLET | Freq: Every day | ORAL | Status: DC
  Administered 2016-12-17: 30 mg via ORAL
  Filled 2016-12-17: qty 1

## 2016-12-17 NOTE — Progress Notes (Signed)
[facility], [place] 12/17/16  Subjective:   Patient is known to our practice from outpatient dialysis.  She is followed by Dr. Juleen China as outpatient for peritoneal dialysis.  Her prescription includes 3000 cc for exchanges, 1 last fill of 1000 cc  Patient did very well with 1.5% dialysate No dizziness or lightheadedness symptoms anymore   Objective:  Vital signs in last 24 hours:  Temp:  [97.7 F (36.5 C)-98.1 F (36.7 C)] 97.7 F (36.5 C) (12/07 0750) Pulse Rate:  [77-89] 86 (12/07 0912) Resp:  [18] 18 (12/07 0750) BP: (94-200)/(54-102) 154/65 (12/07 0912) SpO2:  [95 %-99 %] 97 % (12/07 0912) Weight:  [96.4 kg (212 lb 9.6 oz)] 96.4 kg (212 lb 9.6 oz) (12/07 0500)  Weight change: 2.858 kg (6 lb 4.8 oz) Filed Weights   12/16/16 0518 12/16/16 0946 12/17/16 0500  Weight: 92.1 kg (203 lb) 94.9 kg (209 lb 4.8 oz) 96.4 kg (212 lb 9.6 oz)    Intake/Output:    Intake/Output Summary (Last 24 hours) at 12/17/2016 0956 Last data filed at 12/17/2016 0954 Gross per 24 hour  Intake 1060 ml  Output 1150 ml  Net -90 ml     Physical Exam: General:  No acute distress, laying in the bed  HEENT  anicteric, moist oral mucous membranes  Neck  supple  Pulm/lungs  normal breathing effort, clear to auscultation  CVS/Heart  regular rhythm, no rub or gallop, soft systolic murmur  Abdomen:   Soft, nontender,  Extremities:  No peripheral edema  Neurologic:  Alert, oriented  Skin:  No acute rashes  Access:  PD catheter in place       Basic Metabolic Panel:  Recent Labs  Lab 12/16/16 0555  NA 136  K 3.9  CL 94*  CO2 23  GLUCOSE 200*  BUN 48*  CREATININE 10.84*  CALCIUM 10.0     CBC: Recent Labs  Lab 12/16/16 0555  WBC 11.4*  NEUTROABS 9.1*  HGB 13.5  HCT 40.4  MCV 90.9  PLT 269      Lab Results  Component Value Date   HEPBSAG Negative 09/17/2016   HEPBSAB Non Reactive 09/17/2016      Microbiology:  No results found for this or  any previous visit (from the past 240 hour(s)).  Coagulation Studies: No results for input(s): LABPROT, INR in the last 72 hours.  Urinalysis: No results for input(s): COLORURINE, LABSPEC, PHURINE, GLUCOSEU, HGBUR, BILIRUBINUR, KETONESUR, PROTEINUR, UROBILINOGEN, NITRITE, LEUKOCYTESUR in the last 72 hours.  Invalid input(s): APPERANCEUR    Imaging: Dg Chest Port 1 View  Result Date: 12/16/2016 CLINICAL DATA:  56 year old female with syncope. EXAM: PORTABLE CHEST 1 VIEW COMPARISON:  Chest radiograph dated 12/03/2016 FINDINGS: There has been interval removal of the right IJ dialysis catheter. Left lung base atelectatic changes versus infiltrate. A small left pleural effusion may be present. The right lung is clear. No pneumothorax. Stable mild cardiomegaly. No acute osseous pathology. IMPRESSION: Left lung base atelectasis versus infiltrate. Possible small left pleural effusion. Electronically Signed   By: Anner Crete M.D.   On: 12/16/2016 06:18     Medications:   . dialysis solution 1.5% low-MG/low-CA Stopped (12/16/16 1343)   . aspirin EC  162 mg Oral QHS  . calcium-vitamin D  1 tablet Oral Daily  . ferrous sulfate  325 mg Oral BID  . gentamicin cream  1 application Topical Daily  . heparin  5,000 Units Subcutaneous Q8H  . hydrALAZINE  10 mg Oral Q8H  .  insulin aspart  0-5 Units Subcutaneous QHS  . insulin aspart  0-9 Units Subcutaneous TID WC  . isosorbide mononitrate  30 mg Oral Daily  . magnesium oxide  400 mg Oral Daily  . sevelamer carbonate  1,600 mg Oral TID WC  . sodium chloride flush  3 mL Intravenous Q12H   acetaminophen **OR** acetaminophen, albuterol, gabapentin, heparin, hydrALAZINE, HYDROcodone-acetaminophen, meclizine, ondansetron **OR** ondansetron (ZOFRAN) IV, senna-docusate  Assessment/ Plan:  56 y.o.caucasian female  with diastolic congestive heart failure, hypertension, diabetes mellitus type II, diabetic retinopathy, diabetic neuropathy, ESRD on PD,  anemia of CKD, SHPTH admitted now with syncope.   [facility]/PD/Dr Kolluru  1.  Syncope.  2.  Orthostatic hypotension. 3.  Diabetes mellitus type 2 with chronic kidney disease 4.  ESRD  Consult received to evaluate for dialysis.  Patient is under evaluation for syncope and orthostatic hypotension.  Patient was using higher strength PD fluids at home.  Patient was advised to use 1.5% dialysate solutions for now Further education about how to use  different PD bags provided yesterday and to be continued as outpatient   LOS: 0 Latima Hamza Candiss Norse 12/7/20189:56 AM  [place], [place]

## 2016-12-17 NOTE — Progress Notes (Signed)
Disconnected from peritoneal dialysis cycler without complications.Disconnect cap applied and line closed.Per patient,treatment was tolerated well.Total ultrafiltration=1.1liters.

## 2016-12-17 NOTE — Progress Notes (Signed)
Patient discharged via wheelchair and private vehicle. All discharge instructions given and tele box off and returned. IV removed. No distress noted at this time.

## 2016-12-17 NOTE — Consult Note (Signed)
[facility] Nurse wound consult note Reason for Consult: Trauma to left great toe.  Nail bed is jagged at the top and cracked at the base.  Patient does not remember any trauma. Insensate feet.  Noticed blood yesterday when sock was removed.  Unknown etiology, but findings consistent with trauma to great toe.  Bruising noted in nail bed. Wound type: Presumably trauma Pressure Injury POA: NA Measurement: Bruising and cracked nail only Wound bed: Some bleeding noted.  Will cleanse and treat with antimicrobial ointment.  Explained to patient and spouse to continue this at home.  Drainage (amount, consistency, odor) minimal serosanguinous drainage.  NO odor Periwound: Bruising and cracked nail bed Dressing procedure/placement/frequency:Cleanse with NS.  Apply Bactroban and cover with dry dressing daily.  Will not follow at this time.  Please re-consult if needed.  Domenic Moras RN BSN [place] Pager [phone]

## 2016-12-17 NOTE — Discharge Instructions (Signed)
Dehydration, Adult Dehydration is when there is not enough fluid or water in your body. This happens when you lose more fluids than you take in. Dehydration can range from mild to very bad. It should be treated right away to keep it from getting very bad. Symptoms of mild dehydration may include:  Thirst.  Dry lips.  Slightly dry mouth.  Dry, warm skin.  Dizziness. Symptoms of moderate dehydration may include:  Very dry mouth.  Muscle cramps.  Dark pee (urine). Pee may be the color of tea.  Your body making less pee.  Your eyes making fewer tears.  Heartbeat that is uneven or faster than normal (palpitations).  Headache.  Light-headedness, especially when you stand up from sitting.  Fainting (syncope). Symptoms of very bad dehydration may include:  Changes in skin, such as: ? Cold and clammy skin. ? Blotchy (mottled) or pale skin. ? Skin that does not quickly return to normal after being lightly pinched and let go (poor skin turgor).  Changes in body fluids, such as: ? Feeling very thirsty. ? Your eyes making fewer tears. ? Not sweating when body temperature is high, such as in hot weather. ? Your body making very little pee.  Changes in vital signs, such as: ? Weak pulse. ? Pulse that is more than 100 beats a minute when you are sitting still. ? Fast breathing. ? Low blood pressure.  Other changes, such as: ? Sunken eyes. ? Cold hands and feet. ? Confusion. ? Lack of energy (lethargy). ? Trouble waking up from sleep. ? Short-term weight loss. ? Unconsciousness. Follow these instructions at home:  If told by your doctor, drink an ORS: ? Make an ORS by using instructions on the package. ? Start by drinking small amounts, about  cup (120 mL) every 5-10 minutes. ? Slowly drink more until you have had the amount that your doctor said to have.  Drink enough clear fluid to keep your pee clear or pale yellow. If you were told to drink an ORS, finish the  ORS first, then start slowly drinking clear fluids. Drink fluids such as: ? Water. Do not drink only water by itself. Doing that can make the salt (sodium) level in your body get too low (hyponatremia). ? Ice chips. ? Fruit juice that you have added water to (diluted). ? Low-calorie sports drinks.  Avoid: ? Alcohol. ? Drinks that have a lot of sugar. These include high-calorie sports drinks, fruit juice that does not have water added, and soda. ? Caffeine. ? Foods that are greasy or have a lot of fat or sugar.  Take over-the-counter and prescription medicines only as told by your doctor.  Do not take salt tablets. Doing that can make the salt level in your body get too high (hypernatremia).  Eat foods that have minerals (electrolytes). Examples include bananas, oranges, potatoes, tomatoes, and spinach.  Keep all follow-up visits as told by your doctor. This is important. Contact a doctor if:  You have belly (abdominal) pain that: ? Gets worse. ? Stays in one area (localizes).  You have a rash.  You have a stiff neck.  You get angry or annoyed more easily than normal (irritability).  You are more sleepy than normal.  You have a harder time waking up than normal.  You feel: ? Weak. ? Dizzy. ? Very thirsty.  You have peed (urinated) only a small amount of very dark pee during 6-8 hours. Get help right away if:  You have symptoms  of very bad dehydration.  You cannot drink fluids without throwing up (vomiting).  Your symptoms get worse with treatment.  You have a fever.  You have a very bad headache.  You are throwing up or having watery poop (diarrhea) and it: ? Gets worse. ? Does not go away.  You have blood or something green (bile) in your throw-up.  You have blood in your poop (stool). This may cause poop to look black and tarry.  You have not peed in 6-8 hours.  You pass out (faint).  Your heart rate when you are sitting still is more than 100 beats a  minute.  You have trouble breathing. This information is not intended to replace advice given to you by your health care provider. Make sure you discuss any questions you have with your health care provider. Document Released: 10/24/2008 Document Revised: 07/18/2015 Document Reviewed: 02/21/2015 Elsevier Interactive Patient Education  2018 [employer]. Orthostatic Hypotension Orthostatic hypotension is a sudden drop in blood pressure that happens when you quickly change positions, such as when you get up from a seated or lying position. Blood pressure is a measurement of how strongly, or weakly, your blood is pressing against the walls of your arteries. Arteries are blood vessels that carry blood from your heart throughout your body. When blood pressure is too low, you may not get enough blood to your brain or to the rest of your organs. This can cause weakness, light-headedness, rapid heartbeat, and fainting. This can last for just a few seconds or for up to a few minutes. Orthostatic hypotension is usually not a serious problem. However, if it happens frequently or gets worse, it may be a sign of something more serious. What are the causes? This condition may be caused by:  Sudden changes in posture, such as standing up quickly after you have been sitting or lying down.  Blood loss.  Loss of body fluids (dehydration).  Heart problems.  Hormone (endocrine) problems.  Pregnancy.  Severe infection.  Lack of certain nutrients.  Severe allergic reactions (anaphylaxis).  Certain medicines, such as blood pressure medicine or medicines that make the body lose excess fluids (diuretics). Sometimes, this condition can be caused by not taking medicine as directed, such as taking too much of a certain medicine.  What increases the risk? Certain factors can make you more likely to develop orthostatic hypotension, including:  Age. Risk increases as you get older.  Conditions that affect the  heart or the central nervous system.  Taking certain medicines, such as blood pressure medicine or diuretics.  Being pregnant.  What are the signs or symptoms? Symptoms of this condition may include:  Weakness.  Light-headedness.  Dizziness.  Blurred vision.  Fatigue.  Rapid heartbeat.  Fainting, in severe cases.  How is this diagnosed? This condition is diagnosed based on:  Your medical history.  Your symptoms.  Your blood pressure measurement. Your health care provider will check your blood pressure when you are: ? Lying down. ? Sitting. ? Standing.  A blood pressure reading is recorded as two numbers, such as "120 over 80" (or 120/80). The first ("top") number is called the systolic pressure. It is a measure of the pressure in your arteries as your heart beats. The second ("bottom") number is called the diastolic pressure. It is a measure of the pressure in your arteries when your heart relaxes between beats. Blood pressure is measured in a unit called mm Hg. Healthy blood pressure for adults is 120/80.  If your blood pressure is below 90/60, you may be diagnosed with hypotension. Other information or tests that may be used to diagnose orthostatic hypotension include:  Your other vital signs, such as your heart rate and temperature.  Blood tests.  Tilt table test. For this test, you will be safely secured to a table that moves you from a lying position to an upright position. Your heart rhythm and blood pressure will be monitored during the test.  How is this treated? Treatment for this condition may include:  Changing your diet. This may involve eating more salt (sodium) or drinking more water.  Taking medicines to raise your blood pressure.  Changing the dosage of certain medicines you are taking that might be lowering your blood pressure.  Wearing compression stockings. These stockings help to prevent blood clots and reduce swelling in your legs.  In some  cases, you may need to go to the hospital for:  Fluid replacement. This means you will receive fluids through an IV tube.  Blood replacement. This means you will receive donated blood through an IV tube (transfusion).  Treating an infection or heart problems, if this applies.  Monitoring. You may need to be monitored while medicines that you are taking wear off.  Follow these instructions at home: Eating and drinking   Drink enough fluid to keep your urine clear or pale yellow.  Eat a healthy diet and follow instructions from your health care provider about eating or drinking restrictions. A healthy diet includes: ? Fresh fruits and vegetables. ? Whole grains. ? Lean meats. ? Low-fat dairy products.  Eat extra salt only as directed. Do not add extra salt to your diet unless your health care provider told you to do that.  Eat frequent, small meals.  Avoid standing up suddenly after eating. Medicines  Take over-the-counter and prescription medicines only as told by your health care provider. ? Follow instructions from your health care provider about changing the dosage of your current medicines, if this applies. ? Do not stop or adjust any of your medicines on your own. General instructions  Wear compression stockings as told by your health care provider.  Get up slowly from lying down or sitting positions. This gives your blood pressure a chance to adjust.  Avoid hot showers and excessive heat as directed by your health care provider.  Return to your normal activities as told by your health care provider. Ask your health care provider what activities are safe for you.  Do not use any products that contain nicotine or tobacco, such as cigarettes and e-cigarettes. If you need help quitting, ask your health care provider.  Keep all follow-up visits as told by your health care provider. This is important. Contact a health care provider if:  You vomit.  You have  diarrhea.  You have a fever for more than 2-3 days.  You feel more thirsty than usual.  You feel weak and tired. Get help right away if:  You have chest pain.  You have a fast or irregular heartbeat.  You develop numbness in any part of your body.  You cannot move your arms or your legs.  You have trouble speaking.  You become sweaty or feel lightheaded.  You faint.  You feel short of breath.  You have trouble staying awake.  You feel confused. This information is not intended to replace advice given to you by your health care provider. Make sure you discuss any questions you have with your  health care provider. Document Released: 12/18/2001 Document Revised: 09/16/2015 Document Reviewed: 06/20/2015 Elsevier Interactive Patient Education  2018 [employer].  Renal and ADA diet.

## 2016-12-17 NOTE — Discharge Summary (Signed)
[facility] [place] NAME: Jacqueline Rodriguez    MR#:  [id number]  DATE OF BIRTH:  06-Apr-1960  DATE OF ADMISSION:  12/16/2016   ADMITTING PHYSICIAN: Saundra Shelling, MD  DATE OF DISCHARGE: 12/17/2016 11:32 AM  PRIMARY CARE PHYSICIAN: Hortencia Pilar, MD   ADMISSION DIAGNOSIS:  Dehydration [E86.0] Orthostasis [I95.1] Elevated troponin [R74.8] Syncope, unspecified syncope type [R55] DISCHARGE DIAGNOSIS:  Active Problems:   Syncope  SECONDARY DIAGNOSIS:   Past Medical History:  Diagnosis Date  . Anemia   . Chronic bronchitis ([place])   . Chronic diastolic CHF (congestive heart failure) ([place])   . CKD (chronic kidney disease), stage III ([place])   . COPD (chronic obstructive pulmonary disease) ([place])   . Diabetes mellitus with renal complications ([place])   . Essential hypertension   . GERD (gastroesophageal reflux disease)   . Obesity   . Peripheral vascular disease ([place])   . Pulmonary hypertension ([place])   . Renal insufficiency 09/19/2015   Stage 3 CKD. Beginning dialysis.   HOSPITAL COURSE:   The patient was admitted for syncope episode due to orthostatic hypotension.  1.Syncope due to Orthostatic hypotension.  Given gentle normal saline IV.  Hold imdur.  Improved.  She has no complaints.  Hypertension.  Since blood pressure is elevated, resumed the patient hypertension medication.  3.Chronic kidney disease on peritoneal dialysis.   Continue PD. 4.  ChronicDiastolic heart failure.  Stable. 5.Diabetes mellitus,  she was on sliding scale.  Discussed with Dr. Candiss Norse.  DISCHARGE CONDITIONS:  Stable, discharged to home today. CONSULTS OBTAINED:  Treatment Team:  Murlean Iba, MD DRUG ALLERGIES:   Allergies  Allergen Reactions  . Ambien [Zolpidem] Other (See Comments)    hallucinations   DISCHARGE MEDICATIONS:   Allergies as of 12/17/2016      Reactions   Ambien [zolpidem] Other (See Comments)   hallucinations        Medication List    STOP taking these medications   tobramycin 160 mg in dextrose 5 % 50 mL     TAKE these medications   acetaminophen 500 MG tablet Commonly known as:  TYLENOL Take 500 mg by mouth every 6 (six) hours as needed.   albuterol 108 (90 Base) MCG/ACT inhaler Commonly known as:  PROVENTIL HFA;VENTOLIN HFA Inhale 2 puffs into the lungs every 6 (six) hours as needed for wheezing or shortness of breath.   aspirin 81 MG EC tablet Take 1 tablet (81 mg total) by mouth daily. What changed:    how much to take  when to take this   azithromycin 250 MG tablet Commonly known as:  ZITHROMAX One tablet daily as per DR Ola Spurr infectious disease   CALCIUM 1200+D3 PO Take 1 tablet by mouth daily.   calcium acetate 667 MG capsule Commonly known as:  PHOSLO Take 3 capsules (2,001 mg total) by mouth 3 (three) times daily with meals.   cetirizine 10 MG tablet Commonly known as:  ZYRTEC Take 10 mg by mouth daily as needed for allergies.   ferrous sulfate 325 (65 FE) MG EC tablet Take 1 tablet (325 mg total) by mouth 2 (two) times daily.   furosemide 40 MG tablet Commonly known as:  LASIX Take 1 tablet (40 mg total) by mouth daily.   gabapentin 100 MG capsule Commonly known as:  NEURONTIN Take 100 mg by mouth 3 (three) times daily as needed. For neuropathy.   glipiZIDE 5 MG tablet Commonly known as:  GLUCOTROL Take  5 mg by mouth daily.   GLUCOSAMINE-CHONDROITIN PO Take 4,000 mg by mouth daily.   hydrALAZINE 10 MG tablet Commonly known as:  APRESOLINE Take 1 tablet (10 mg total) by mouth 3 (three) times daily.   HYDROcodone-acetaminophen 5-325 MG tablet Commonly known as:  NORCO Take 1 tablet by mouth every 6 (six) hours as needed for moderate pain.   isosorbide mononitrate 30 MG 24 hr tablet Commonly known as:  IMDUR Take 30 mg by mouth daily.   magnesium oxide 400 (241.3 Mg) MG tablet Commonly known as:  MAG-OX Take 1 tablet (400 mg total) by mouth  daily.   meclizine 25 MG tablet Commonly known as:  ANTIVERT Take 1 tablet (25 mg total) by mouth 3 (three) times daily as needed for dizziness.   ondansetron 4 MG tablet Commonly known as:  ZOFRAN Take 1 tablet (4 mg total) by mouth every 6 (six) hours as needed for nausea.   sevelamer carbonate 800 MG tablet Commonly known as:  RENVELA Take 1,600 mg by mouth 3 (three) times daily with meals.        DISCHARGE INSTRUCTIONS:  See AVS.  If you experience worsening of your admission symptoms, develop shortness of breath, life threatening emergency, suicidal or homicidal thoughts you must seek medical attention immediately by calling 911 or calling your MD immediately  if symptoms less severe.  You Must read complete instructions/literature along with all the possible adverse reactions/side effects for all the Medicines you take and that have been prescribed to you. Take any new Medicines after you have completely understood and accpet all the possible adverse reactions/side effects.   Please note  You were cared for by a hospitalist during your hospital stay. If you have any questions about your discharge medications or the care you received while you were in the hospital after you are discharged, you can call the unit and asked to speak with the hospitalist on call if the hospitalist that took care of you is not available. Once you are discharged, your primary care physician will handle any further medical issues. Please note that NO REFILLS for any discharge medications will be authorized once you are discharged, as it is imperative that you return to your primary care physician (or establish a relationship with a primary care physician if you do not have one) for your aftercare needs so that they can reassess your need for medications and monitor your lab values.    On the day of Discharge:  VITAL SIGNS:  Blood pressure (!) 154/65, pulse 86, temperature 97.7 F (36.5 C), temperature  source Oral, resp. rate 18, height 5\' 9"  (1.753 m), weight 212 lb 9.6 oz (96.4 kg), last menstrual period 09/18/2013, SpO2 97 %. PHYSICAL EXAMINATION:  GENERAL:  56 y.o.-year-old patient lying in the bed with no acute distress.  EYES: Pupils equal, round, reactive to light and accommodation. No scleral icterus. Extraocular muscles intact.  HEENT: Head atraumatic, normocephalic. Oropharynx and nasopharynx clear.  NECK:  Supple, no jugular venous distention. No thyroid enlargement, no tenderness.  LUNGS: Normal breath sounds bilaterally, no wheezing, rales,rhonchi or crepitation. No use of accessory muscles of respiration.  CARDIOVASCULAR: S1, S2 normal. No murmurs, rubs, or gallops.  ABDOMEN: Soft, non-tender, non-distended. Bowel sounds present. No organomegaly or mass.  EXTREMITIES: No pedal edema, cyanosis, or clubbing.  NEUROLOGIC: Cranial nerves II through XII are intact. Muscle strength 5/5 in all extremities. Sensation intact. Gait not checked.  PSYCHIATRIC: The patient is alert and oriented x 3.  SKIN: No obvious rash, lesion, or ulcer.  DATA REVIEW:   CBC Recent Labs  Lab 12/16/16 0555  WBC 11.4*  HGB 13.5  HCT 40.4  PLT 269    Chemistries  Recent Labs  Lab 12/16/16 0555  NA 136  K 3.9  CL 94*  CO2 23  GLUCOSE 200*  BUN 48*  CREATININE 10.84*  CALCIUM 10.0  AST 38  ALT 25  ALKPHOS 25  BILITOT 0.8     Microbiology Results  Results for orders placed or performed during the hospital encounter of 09/08/16  Urine culture     Status: Abnormal   Collection Time: 09/08/16 12:27 PM  Result Value Ref Range Status   Specimen Description URINE, RANDOM  Final   Special Requests NONE  Final   Culture MULTIPLE SPECIES PRESENT, SUGGEST RECOLLECTION (A)  Final   Report Status 09/10/2016 FINAL  Final  MRSA PCR Screening     Status: None   Collection Time: 09/08/16  6:05 PM  Result Value Ref Range Status   MRSA by PCR NEGATIVE NEGATIVE Final    Comment:        The  GeneXpert MRSA Assay (FDA approved for NASAL specimens only), is one component of a comprehensive MRSA colonization surveillance program. It is not intended to diagnose MRSA infection nor to guide or monitor treatment for MRSA infections.     RADIOLOGY:  No results found.   Management plans discussed with the patient, family and they are in agreement.  CODE STATUS: Prior   TOTAL TIME TAKING CARE OF THIS PATIENT: 25 minutes.    Demetrios Loll M.D on 12/17/2016 at 4:32 PM  Between 7am to 6pm - Pager - [phone]  After 6pm go to [email or web address] - Proofreader  [facility]  Office  [phone]  CC: Primary care physician; Hortencia Pilar, MD   Note: This dictation was prepared with Dragon dictation along with smaller phrase technology. Any transcriptional errors that result from this process are unintentional.

## 2016-12-18 DIAGNOSIS — Z992 Dependence on renal dialysis: Secondary | ICD-10-CM | POA: Diagnosis not present

## 2016-12-18 DIAGNOSIS — N186 End stage renal disease: Secondary | ICD-10-CM | POA: Diagnosis not present

## 2016-12-18 DIAGNOSIS — D509 Iron deficiency anemia, unspecified: Secondary | ICD-10-CM | POA: Diagnosis not present

## 2016-12-19 DIAGNOSIS — N186 End stage renal disease: Secondary | ICD-10-CM | POA: Diagnosis not present

## 2016-12-19 DIAGNOSIS — Z992 Dependence on renal dialysis: Secondary | ICD-10-CM | POA: Diagnosis not present

## 2016-12-19 DIAGNOSIS — D509 Iron deficiency anemia, unspecified: Secondary | ICD-10-CM | POA: Diagnosis not present

## 2016-12-20 DIAGNOSIS — Z992 Dependence on renal dialysis: Secondary | ICD-10-CM | POA: Diagnosis not present

## 2016-12-20 DIAGNOSIS — N186 End stage renal disease: Secondary | ICD-10-CM | POA: Diagnosis not present

## 2016-12-20 DIAGNOSIS — D509 Iron deficiency anemia, unspecified: Secondary | ICD-10-CM | POA: Diagnosis not present

## 2016-12-21 DIAGNOSIS — D509 Iron deficiency anemia, unspecified: Secondary | ICD-10-CM | POA: Diagnosis not present

## 2016-12-21 DIAGNOSIS — N186 End stage renal disease: Secondary | ICD-10-CM | POA: Diagnosis not present

## 2016-12-21 DIAGNOSIS — Z992 Dependence on renal dialysis: Secondary | ICD-10-CM | POA: Diagnosis not present

## 2016-12-22 DIAGNOSIS — Z992 Dependence on renal dialysis: Secondary | ICD-10-CM | POA: Diagnosis not present

## 2016-12-22 DIAGNOSIS — D509 Iron deficiency anemia, unspecified: Secondary | ICD-10-CM | POA: Diagnosis not present

## 2016-12-22 DIAGNOSIS — N186 End stage renal disease: Secondary | ICD-10-CM | POA: Diagnosis not present

## 2016-12-23 DIAGNOSIS — N186 End stage renal disease: Secondary | ICD-10-CM | POA: Diagnosis not present

## 2016-12-23 DIAGNOSIS — D509 Iron deficiency anemia, unspecified: Secondary | ICD-10-CM | POA: Diagnosis not present

## 2016-12-23 DIAGNOSIS — Z992 Dependence on renal dialysis: Secondary | ICD-10-CM | POA: Diagnosis not present

## 2016-12-24 DIAGNOSIS — Z992 Dependence on renal dialysis: Secondary | ICD-10-CM | POA: Diagnosis not present

## 2016-12-24 DIAGNOSIS — N186 End stage renal disease: Secondary | ICD-10-CM | POA: Diagnosis not present

## 2016-12-24 DIAGNOSIS — D509 Iron deficiency anemia, unspecified: Secondary | ICD-10-CM | POA: Diagnosis not present

## 2016-12-25 DIAGNOSIS — Z992 Dependence on renal dialysis: Secondary | ICD-10-CM | POA: Diagnosis not present

## 2016-12-25 DIAGNOSIS — D509 Iron deficiency anemia, unspecified: Secondary | ICD-10-CM | POA: Diagnosis not present

## 2016-12-25 DIAGNOSIS — N186 End stage renal disease: Secondary | ICD-10-CM | POA: Diagnosis not present

## 2016-12-26 DIAGNOSIS — N186 End stage renal disease: Secondary | ICD-10-CM | POA: Diagnosis not present

## 2016-12-26 DIAGNOSIS — Z992 Dependence on renal dialysis: Secondary | ICD-10-CM | POA: Diagnosis not present

## 2016-12-26 DIAGNOSIS — D509 Iron deficiency anemia, unspecified: Secondary | ICD-10-CM | POA: Diagnosis not present

## 2016-12-27 DIAGNOSIS — D509 Iron deficiency anemia, unspecified: Secondary | ICD-10-CM | POA: Diagnosis not present

## 2016-12-27 DIAGNOSIS — Z992 Dependence on renal dialysis: Secondary | ICD-10-CM | POA: Diagnosis not present

## 2016-12-27 DIAGNOSIS — N186 End stage renal disease: Secondary | ICD-10-CM | POA: Diagnosis not present

## 2016-12-28 DIAGNOSIS — N186 End stage renal disease: Secondary | ICD-10-CM | POA: Diagnosis not present

## 2016-12-28 DIAGNOSIS — Z992 Dependence on renal dialysis: Secondary | ICD-10-CM | POA: Diagnosis not present

## 2016-12-28 DIAGNOSIS — D509 Iron deficiency anemia, unspecified: Secondary | ICD-10-CM | POA: Diagnosis not present

## 2016-12-29 DIAGNOSIS — Z992 Dependence on renal dialysis: Secondary | ICD-10-CM | POA: Diagnosis not present

## 2016-12-29 DIAGNOSIS — N186 End stage renal disease: Secondary | ICD-10-CM | POA: Diagnosis not present

## 2016-12-29 DIAGNOSIS — D509 Iron deficiency anemia, unspecified: Secondary | ICD-10-CM | POA: Diagnosis not present

## 2016-12-30 DIAGNOSIS — N186 End stage renal disease: Secondary | ICD-10-CM | POA: Diagnosis not present

## 2016-12-30 DIAGNOSIS — D509 Iron deficiency anemia, unspecified: Secondary | ICD-10-CM | POA: Diagnosis not present

## 2016-12-30 DIAGNOSIS — Z992 Dependence on renal dialysis: Secondary | ICD-10-CM | POA: Diagnosis not present

## 2016-12-31 DIAGNOSIS — D509 Iron deficiency anemia, unspecified: Secondary | ICD-10-CM | POA: Diagnosis not present

## 2016-12-31 DIAGNOSIS — N186 End stage renal disease: Secondary | ICD-10-CM | POA: Diagnosis not present

## 2016-12-31 DIAGNOSIS — Z992 Dependence on renal dialysis: Secondary | ICD-10-CM | POA: Diagnosis not present

## 2017-01-01 DIAGNOSIS — D509 Iron deficiency anemia, unspecified: Secondary | ICD-10-CM | POA: Diagnosis not present

## 2017-01-01 DIAGNOSIS — Z992 Dependence on renal dialysis: Secondary | ICD-10-CM | POA: Diagnosis not present

## 2017-01-01 DIAGNOSIS — N186 End stage renal disease: Secondary | ICD-10-CM | POA: Diagnosis not present

## 2017-01-02 DIAGNOSIS — D509 Iron deficiency anemia, unspecified: Secondary | ICD-10-CM | POA: Diagnosis not present

## 2017-01-02 DIAGNOSIS — Z992 Dependence on renal dialysis: Secondary | ICD-10-CM | POA: Diagnosis not present

## 2017-01-02 DIAGNOSIS — N186 End stage renal disease: Secondary | ICD-10-CM | POA: Diagnosis not present

## 2017-01-03 DIAGNOSIS — D509 Iron deficiency anemia, unspecified: Secondary | ICD-10-CM | POA: Diagnosis not present

## 2017-01-03 DIAGNOSIS — N186 End stage renal disease: Secondary | ICD-10-CM | POA: Diagnosis not present

## 2017-01-03 DIAGNOSIS — Z992 Dependence on renal dialysis: Secondary | ICD-10-CM | POA: Diagnosis not present

## 2017-01-04 DIAGNOSIS — N186 End stage renal disease: Secondary | ICD-10-CM | POA: Diagnosis not present

## 2017-01-04 DIAGNOSIS — Z992 Dependence on renal dialysis: Secondary | ICD-10-CM | POA: Diagnosis not present

## 2017-01-04 DIAGNOSIS — D509 Iron deficiency anemia, unspecified: Secondary | ICD-10-CM | POA: Diagnosis not present

## 2017-01-05 DIAGNOSIS — D509 Iron deficiency anemia, unspecified: Secondary | ICD-10-CM | POA: Diagnosis not present

## 2017-01-05 DIAGNOSIS — Z992 Dependence on renal dialysis: Secondary | ICD-10-CM | POA: Diagnosis not present

## 2017-01-05 DIAGNOSIS — N186 End stage renal disease: Secondary | ICD-10-CM | POA: Diagnosis not present

## 2017-01-06 DIAGNOSIS — Z992 Dependence on renal dialysis: Secondary | ICD-10-CM | POA: Diagnosis not present

## 2017-01-06 DIAGNOSIS — N186 End stage renal disease: Secondary | ICD-10-CM | POA: Diagnosis not present

## 2017-01-07 DIAGNOSIS — Z992 Dependence on renal dialysis: Secondary | ICD-10-CM | POA: Diagnosis not present

## 2017-01-07 DIAGNOSIS — N186 End stage renal disease: Secondary | ICD-10-CM | POA: Diagnosis not present

## 2017-01-08 DIAGNOSIS — N186 End stage renal disease: Secondary | ICD-10-CM | POA: Diagnosis not present

## 2017-01-08 DIAGNOSIS — Z992 Dependence on renal dialysis: Secondary | ICD-10-CM | POA: Diagnosis not present

## 2017-01-09 DIAGNOSIS — Z992 Dependence on renal dialysis: Secondary | ICD-10-CM | POA: Diagnosis not present

## 2017-01-09 DIAGNOSIS — N186 End stage renal disease: Secondary | ICD-10-CM | POA: Diagnosis not present

## 2017-01-10 DIAGNOSIS — N186 End stage renal disease: Secondary | ICD-10-CM | POA: Diagnosis not present

## 2017-01-10 DIAGNOSIS — Z992 Dependence on renal dialysis: Secondary | ICD-10-CM | POA: Diagnosis not present

## 2017-01-11 DIAGNOSIS — N186 End stage renal disease: Secondary | ICD-10-CM | POA: Diagnosis not present

## 2017-01-11 DIAGNOSIS — Z992 Dependence on renal dialysis: Secondary | ICD-10-CM | POA: Diagnosis not present

## 2017-01-11 DIAGNOSIS — D631 Anemia in chronic kidney disease: Secondary | ICD-10-CM | POA: Diagnosis not present

## 2017-01-12 DIAGNOSIS — D631 Anemia in chronic kidney disease: Secondary | ICD-10-CM | POA: Diagnosis not present

## 2017-01-12 DIAGNOSIS — N186 End stage renal disease: Secondary | ICD-10-CM | POA: Diagnosis not present

## 2017-01-12 DIAGNOSIS — Z992 Dependence on renal dialysis: Secondary | ICD-10-CM | POA: Diagnosis not present

## 2017-01-13 DIAGNOSIS — N186 End stage renal disease: Secondary | ICD-10-CM | POA: Diagnosis not present

## 2017-01-13 DIAGNOSIS — D631 Anemia in chronic kidney disease: Secondary | ICD-10-CM | POA: Diagnosis not present

## 2017-01-13 DIAGNOSIS — Z992 Dependence on renal dialysis: Secondary | ICD-10-CM | POA: Diagnosis not present

## 2017-01-14 DIAGNOSIS — N186 End stage renal disease: Secondary | ICD-10-CM | POA: Diagnosis not present

## 2017-01-14 DIAGNOSIS — D631 Anemia in chronic kidney disease: Secondary | ICD-10-CM | POA: Diagnosis not present

## 2017-01-14 DIAGNOSIS — Z992 Dependence on renal dialysis: Secondary | ICD-10-CM | POA: Diagnosis not present

## 2017-01-15 DIAGNOSIS — D631 Anemia in chronic kidney disease: Secondary | ICD-10-CM | POA: Diagnosis not present

## 2017-01-15 DIAGNOSIS — N186 End stage renal disease: Secondary | ICD-10-CM | POA: Diagnosis not present

## 2017-01-15 DIAGNOSIS — Z992 Dependence on renal dialysis: Secondary | ICD-10-CM | POA: Diagnosis not present

## 2017-01-16 DIAGNOSIS — D631 Anemia in chronic kidney disease: Secondary | ICD-10-CM | POA: Diagnosis not present

## 2017-01-16 DIAGNOSIS — Z992 Dependence on renal dialysis: Secondary | ICD-10-CM | POA: Diagnosis not present

## 2017-01-16 DIAGNOSIS — N186 End stage renal disease: Secondary | ICD-10-CM | POA: Diagnosis not present

## 2017-01-17 DIAGNOSIS — D631 Anemia in chronic kidney disease: Secondary | ICD-10-CM | POA: Diagnosis not present

## 2017-01-17 DIAGNOSIS — N186 End stage renal disease: Secondary | ICD-10-CM | POA: Diagnosis not present

## 2017-01-17 DIAGNOSIS — Z992 Dependence on renal dialysis: Secondary | ICD-10-CM | POA: Diagnosis not present

## 2017-01-18 DIAGNOSIS — Z992 Dependence on renal dialysis: Secondary | ICD-10-CM | POA: Diagnosis not present

## 2017-01-18 DIAGNOSIS — D631 Anemia in chronic kidney disease: Secondary | ICD-10-CM | POA: Diagnosis not present

## 2017-01-18 DIAGNOSIS — N186 End stage renal disease: Secondary | ICD-10-CM | POA: Diagnosis not present

## 2017-01-19 DIAGNOSIS — N186 End stage renal disease: Secondary | ICD-10-CM | POA: Diagnosis not present

## 2017-01-19 DIAGNOSIS — Z992 Dependence on renal dialysis: Secondary | ICD-10-CM | POA: Diagnosis not present

## 2017-01-19 DIAGNOSIS — D631 Anemia in chronic kidney disease: Secondary | ICD-10-CM | POA: Diagnosis not present

## 2017-01-20 DIAGNOSIS — Z992 Dependence on renal dialysis: Secondary | ICD-10-CM | POA: Diagnosis not present

## 2017-01-20 DIAGNOSIS — D631 Anemia in chronic kidney disease: Secondary | ICD-10-CM | POA: Diagnosis not present

## 2017-01-20 DIAGNOSIS — N186 End stage renal disease: Secondary | ICD-10-CM | POA: Diagnosis not present

## 2017-01-21 DIAGNOSIS — D631 Anemia in chronic kidney disease: Secondary | ICD-10-CM | POA: Diagnosis not present

## 2017-01-21 DIAGNOSIS — N186 End stage renal disease: Secondary | ICD-10-CM | POA: Diagnosis not present

## 2017-01-21 DIAGNOSIS — Z992 Dependence on renal dialysis: Secondary | ICD-10-CM | POA: Diagnosis not present

## 2017-01-22 DIAGNOSIS — Z992 Dependence on renal dialysis: Secondary | ICD-10-CM | POA: Diagnosis not present

## 2017-01-22 DIAGNOSIS — N186 End stage renal disease: Secondary | ICD-10-CM | POA: Diagnosis not present

## 2017-01-22 DIAGNOSIS — D631 Anemia in chronic kidney disease: Secondary | ICD-10-CM | POA: Diagnosis not present

## 2017-01-23 DIAGNOSIS — D631 Anemia in chronic kidney disease: Secondary | ICD-10-CM | POA: Diagnosis not present

## 2017-01-23 DIAGNOSIS — Z992 Dependence on renal dialysis: Secondary | ICD-10-CM | POA: Diagnosis not present

## 2017-01-23 DIAGNOSIS — N186 End stage renal disease: Secondary | ICD-10-CM | POA: Diagnosis not present

## 2017-01-24 DIAGNOSIS — D631 Anemia in chronic kidney disease: Secondary | ICD-10-CM | POA: Diagnosis not present

## 2017-01-24 DIAGNOSIS — Z992 Dependence on renal dialysis: Secondary | ICD-10-CM | POA: Diagnosis not present

## 2017-01-24 DIAGNOSIS — N186 End stage renal disease: Secondary | ICD-10-CM | POA: Diagnosis not present

## 2017-01-25 DIAGNOSIS — N186 End stage renal disease: Secondary | ICD-10-CM | POA: Diagnosis not present

## 2017-01-25 DIAGNOSIS — D631 Anemia in chronic kidney disease: Secondary | ICD-10-CM | POA: Diagnosis not present

## 2017-01-25 DIAGNOSIS — Z992 Dependence on renal dialysis: Secondary | ICD-10-CM | POA: Diagnosis not present

## 2017-01-26 DIAGNOSIS — D631 Anemia in chronic kidney disease: Secondary | ICD-10-CM | POA: Diagnosis not present

## 2017-01-26 DIAGNOSIS — Z992 Dependence on renal dialysis: Secondary | ICD-10-CM | POA: Diagnosis not present

## 2017-01-26 DIAGNOSIS — N186 End stage renal disease: Secondary | ICD-10-CM | POA: Diagnosis not present

## 2017-01-27 DIAGNOSIS — N186 End stage renal disease: Secondary | ICD-10-CM | POA: Diagnosis not present

## 2017-01-27 DIAGNOSIS — D631 Anemia in chronic kidney disease: Secondary | ICD-10-CM | POA: Diagnosis not present

## 2017-01-27 DIAGNOSIS — Z992 Dependence on renal dialysis: Secondary | ICD-10-CM | POA: Diagnosis not present

## 2017-01-28 DIAGNOSIS — N186 End stage renal disease: Secondary | ICD-10-CM | POA: Diagnosis not present

## 2017-01-28 DIAGNOSIS — D631 Anemia in chronic kidney disease: Secondary | ICD-10-CM | POA: Diagnosis not present

## 2017-01-28 DIAGNOSIS — Z992 Dependence on renal dialysis: Secondary | ICD-10-CM | POA: Diagnosis not present

## 2017-01-29 DIAGNOSIS — Z992 Dependence on renal dialysis: Secondary | ICD-10-CM | POA: Diagnosis not present

## 2017-01-29 DIAGNOSIS — D631 Anemia in chronic kidney disease: Secondary | ICD-10-CM | POA: Diagnosis not present

## 2017-01-29 DIAGNOSIS — N186 End stage renal disease: Secondary | ICD-10-CM | POA: Diagnosis not present

## 2017-01-30 DIAGNOSIS — N186 End stage renal disease: Secondary | ICD-10-CM | POA: Diagnosis not present

## 2017-01-30 DIAGNOSIS — Z992 Dependence on renal dialysis: Secondary | ICD-10-CM | POA: Diagnosis not present

## 2017-01-30 DIAGNOSIS — D631 Anemia in chronic kidney disease: Secondary | ICD-10-CM | POA: Diagnosis not present

## 2017-01-31 DIAGNOSIS — N186 End stage renal disease: Secondary | ICD-10-CM | POA: Diagnosis not present

## 2017-01-31 DIAGNOSIS — D631 Anemia in chronic kidney disease: Secondary | ICD-10-CM | POA: Diagnosis not present

## 2017-01-31 DIAGNOSIS — Z992 Dependence on renal dialysis: Secondary | ICD-10-CM | POA: Diagnosis not present

## 2017-02-01 DIAGNOSIS — N186 End stage renal disease: Secondary | ICD-10-CM | POA: Diagnosis not present

## 2017-02-01 DIAGNOSIS — D631 Anemia in chronic kidney disease: Secondary | ICD-10-CM | POA: Diagnosis not present

## 2017-02-01 DIAGNOSIS — Z992 Dependence on renal dialysis: Secondary | ICD-10-CM | POA: Diagnosis not present

## 2017-02-02 DIAGNOSIS — Z992 Dependence on renal dialysis: Secondary | ICD-10-CM | POA: Diagnosis not present

## 2017-02-02 DIAGNOSIS — D631 Anemia in chronic kidney disease: Secondary | ICD-10-CM | POA: Diagnosis not present

## 2017-02-02 DIAGNOSIS — N186 End stage renal disease: Secondary | ICD-10-CM | POA: Diagnosis not present

## 2017-02-03 DIAGNOSIS — D631 Anemia in chronic kidney disease: Secondary | ICD-10-CM | POA: Diagnosis not present

## 2017-02-03 DIAGNOSIS — Z992 Dependence on renal dialysis: Secondary | ICD-10-CM | POA: Diagnosis not present

## 2017-02-03 DIAGNOSIS — N186 End stage renal disease: Secondary | ICD-10-CM | POA: Diagnosis not present

## 2017-02-04 DIAGNOSIS — D631 Anemia in chronic kidney disease: Secondary | ICD-10-CM | POA: Diagnosis not present

## 2017-02-04 DIAGNOSIS — N186 End stage renal disease: Secondary | ICD-10-CM | POA: Diagnosis not present

## 2017-02-04 DIAGNOSIS — Z992 Dependence on renal dialysis: Secondary | ICD-10-CM | POA: Diagnosis not present

## 2017-02-05 DIAGNOSIS — Z992 Dependence on renal dialysis: Secondary | ICD-10-CM | POA: Diagnosis not present

## 2017-02-05 DIAGNOSIS — D631 Anemia in chronic kidney disease: Secondary | ICD-10-CM | POA: Diagnosis not present

## 2017-02-05 DIAGNOSIS — N186 End stage renal disease: Secondary | ICD-10-CM | POA: Diagnosis not present

## 2017-02-06 DIAGNOSIS — N186 End stage renal disease: Secondary | ICD-10-CM | POA: Diagnosis not present

## 2017-02-06 DIAGNOSIS — D631 Anemia in chronic kidney disease: Secondary | ICD-10-CM | POA: Diagnosis not present

## 2017-02-06 DIAGNOSIS — Z992 Dependence on renal dialysis: Secondary | ICD-10-CM | POA: Diagnosis not present

## 2017-02-07 DIAGNOSIS — Z992 Dependence on renal dialysis: Secondary | ICD-10-CM | POA: Diagnosis not present

## 2017-02-07 DIAGNOSIS — N186 End stage renal disease: Secondary | ICD-10-CM | POA: Diagnosis not present

## 2017-02-07 DIAGNOSIS — D631 Anemia in chronic kidney disease: Secondary | ICD-10-CM | POA: Diagnosis not present

## 2017-02-08 DIAGNOSIS — D631 Anemia in chronic kidney disease: Secondary | ICD-10-CM | POA: Diagnosis not present

## 2017-02-08 DIAGNOSIS — Z992 Dependence on renal dialysis: Secondary | ICD-10-CM | POA: Diagnosis not present

## 2017-02-08 DIAGNOSIS — N186 End stage renal disease: Secondary | ICD-10-CM | POA: Diagnosis not present

## 2017-02-09 DIAGNOSIS — D631 Anemia in chronic kidney disease: Secondary | ICD-10-CM | POA: Diagnosis not present

## 2017-02-09 DIAGNOSIS — Z992 Dependence on renal dialysis: Secondary | ICD-10-CM | POA: Diagnosis not present

## 2017-02-09 DIAGNOSIS — N186 End stage renal disease: Secondary | ICD-10-CM | POA: Diagnosis not present

## 2017-02-10 DIAGNOSIS — N186 End stage renal disease: Secondary | ICD-10-CM | POA: Diagnosis not present

## 2017-02-10 DIAGNOSIS — D631 Anemia in chronic kidney disease: Secondary | ICD-10-CM | POA: Diagnosis not present

## 2017-02-10 DIAGNOSIS — Z992 Dependence on renal dialysis: Secondary | ICD-10-CM | POA: Diagnosis not present

## 2017-02-11 DIAGNOSIS — D509 Iron deficiency anemia, unspecified: Secondary | ICD-10-CM | POA: Diagnosis not present

## 2017-02-11 DIAGNOSIS — D631 Anemia in chronic kidney disease: Secondary | ICD-10-CM | POA: Diagnosis not present

## 2017-02-11 DIAGNOSIS — Z992 Dependence on renal dialysis: Secondary | ICD-10-CM | POA: Diagnosis not present

## 2017-02-11 DIAGNOSIS — N186 End stage renal disease: Secondary | ICD-10-CM | POA: Diagnosis not present

## 2017-02-12 DIAGNOSIS — D631 Anemia in chronic kidney disease: Secondary | ICD-10-CM | POA: Diagnosis not present

## 2017-02-12 DIAGNOSIS — Z992 Dependence on renal dialysis: Secondary | ICD-10-CM | POA: Diagnosis not present

## 2017-02-12 DIAGNOSIS — D509 Iron deficiency anemia, unspecified: Secondary | ICD-10-CM | POA: Diagnosis not present

## 2017-02-12 DIAGNOSIS — N186 End stage renal disease: Secondary | ICD-10-CM | POA: Diagnosis not present

## 2017-02-13 DIAGNOSIS — N186 End stage renal disease: Secondary | ICD-10-CM | POA: Diagnosis not present

## 2017-02-13 DIAGNOSIS — D631 Anemia in chronic kidney disease: Secondary | ICD-10-CM | POA: Diagnosis not present

## 2017-02-13 DIAGNOSIS — D509 Iron deficiency anemia, unspecified: Secondary | ICD-10-CM | POA: Diagnosis not present

## 2017-02-13 DIAGNOSIS — Z992 Dependence on renal dialysis: Secondary | ICD-10-CM | POA: Diagnosis not present

## 2017-02-14 DIAGNOSIS — N186 End stage renal disease: Secondary | ICD-10-CM | POA: Diagnosis not present

## 2017-02-14 DIAGNOSIS — D631 Anemia in chronic kidney disease: Secondary | ICD-10-CM | POA: Diagnosis not present

## 2017-02-14 DIAGNOSIS — Z992 Dependence on renal dialysis: Secondary | ICD-10-CM | POA: Diagnosis not present

## 2017-02-14 DIAGNOSIS — D509 Iron deficiency anemia, unspecified: Secondary | ICD-10-CM | POA: Diagnosis not present

## 2017-02-15 ENCOUNTER — Ambulatory Visit (INDEPENDENT_AMBULATORY_CARE_PROVIDER_SITE_OTHER): Payer: Medicare Other | Admitting: Vascular Surgery

## 2017-02-15 DIAGNOSIS — D509 Iron deficiency anemia, unspecified: Secondary | ICD-10-CM | POA: Diagnosis not present

## 2017-02-15 DIAGNOSIS — Z992 Dependence on renal dialysis: Secondary | ICD-10-CM | POA: Diagnosis not present

## 2017-02-15 DIAGNOSIS — D631 Anemia in chronic kidney disease: Secondary | ICD-10-CM | POA: Diagnosis not present

## 2017-02-15 DIAGNOSIS — N186 End stage renal disease: Secondary | ICD-10-CM | POA: Diagnosis not present

## 2017-02-16 DIAGNOSIS — D631 Anemia in chronic kidney disease: Secondary | ICD-10-CM | POA: Diagnosis not present

## 2017-02-16 DIAGNOSIS — D509 Iron deficiency anemia, unspecified: Secondary | ICD-10-CM | POA: Diagnosis not present

## 2017-02-16 DIAGNOSIS — N186 End stage renal disease: Secondary | ICD-10-CM | POA: Diagnosis not present

## 2017-02-16 DIAGNOSIS — Z992 Dependence on renal dialysis: Secondary | ICD-10-CM | POA: Diagnosis not present

## 2017-02-17 DIAGNOSIS — Z992 Dependence on renal dialysis: Secondary | ICD-10-CM | POA: Diagnosis not present

## 2017-02-17 DIAGNOSIS — N186 End stage renal disease: Secondary | ICD-10-CM | POA: Diagnosis not present

## 2017-02-17 DIAGNOSIS — D631 Anemia in chronic kidney disease: Secondary | ICD-10-CM | POA: Diagnosis not present

## 2017-02-17 DIAGNOSIS — D509 Iron deficiency anemia, unspecified: Secondary | ICD-10-CM | POA: Diagnosis not present

## 2017-02-18 DIAGNOSIS — N186 End stage renal disease: Secondary | ICD-10-CM | POA: Diagnosis not present

## 2017-02-18 DIAGNOSIS — D509 Iron deficiency anemia, unspecified: Secondary | ICD-10-CM | POA: Diagnosis not present

## 2017-02-18 DIAGNOSIS — Z992 Dependence on renal dialysis: Secondary | ICD-10-CM | POA: Diagnosis not present

## 2017-02-18 DIAGNOSIS — D631 Anemia in chronic kidney disease: Secondary | ICD-10-CM | POA: Diagnosis not present

## 2017-02-19 DIAGNOSIS — D631 Anemia in chronic kidney disease: Secondary | ICD-10-CM | POA: Diagnosis not present

## 2017-02-19 DIAGNOSIS — Z992 Dependence on renal dialysis: Secondary | ICD-10-CM | POA: Diagnosis not present

## 2017-02-19 DIAGNOSIS — N186 End stage renal disease: Secondary | ICD-10-CM | POA: Diagnosis not present

## 2017-02-19 DIAGNOSIS — D509 Iron deficiency anemia, unspecified: Secondary | ICD-10-CM | POA: Diagnosis not present

## 2017-02-20 DIAGNOSIS — Z992 Dependence on renal dialysis: Secondary | ICD-10-CM | POA: Diagnosis not present

## 2017-02-20 DIAGNOSIS — D631 Anemia in chronic kidney disease: Secondary | ICD-10-CM | POA: Diagnosis not present

## 2017-02-20 DIAGNOSIS — D509 Iron deficiency anemia, unspecified: Secondary | ICD-10-CM | POA: Diagnosis not present

## 2017-02-20 DIAGNOSIS — N186 End stage renal disease: Secondary | ICD-10-CM | POA: Diagnosis not present

## 2017-02-21 DIAGNOSIS — Z992 Dependence on renal dialysis: Secondary | ICD-10-CM | POA: Diagnosis not present

## 2017-02-21 DIAGNOSIS — D509 Iron deficiency anemia, unspecified: Secondary | ICD-10-CM | POA: Diagnosis not present

## 2017-02-21 DIAGNOSIS — D631 Anemia in chronic kidney disease: Secondary | ICD-10-CM | POA: Diagnosis not present

## 2017-02-21 DIAGNOSIS — N186 End stage renal disease: Secondary | ICD-10-CM | POA: Diagnosis not present

## 2017-02-22 DIAGNOSIS — N186 End stage renal disease: Secondary | ICD-10-CM | POA: Diagnosis not present

## 2017-02-22 DIAGNOSIS — D631 Anemia in chronic kidney disease: Secondary | ICD-10-CM | POA: Diagnosis not present

## 2017-02-22 DIAGNOSIS — D509 Iron deficiency anemia, unspecified: Secondary | ICD-10-CM | POA: Diagnosis not present

## 2017-02-22 DIAGNOSIS — Z992 Dependence on renal dialysis: Secondary | ICD-10-CM | POA: Diagnosis not present

## 2017-02-23 DIAGNOSIS — D631 Anemia in chronic kidney disease: Secondary | ICD-10-CM | POA: Diagnosis not present

## 2017-02-23 DIAGNOSIS — D509 Iron deficiency anemia, unspecified: Secondary | ICD-10-CM | POA: Diagnosis not present

## 2017-02-23 DIAGNOSIS — N186 End stage renal disease: Secondary | ICD-10-CM | POA: Diagnosis not present

## 2017-02-23 DIAGNOSIS — Z992 Dependence on renal dialysis: Secondary | ICD-10-CM | POA: Diagnosis not present

## 2017-02-24 DIAGNOSIS — D509 Iron deficiency anemia, unspecified: Secondary | ICD-10-CM | POA: Diagnosis not present

## 2017-02-24 DIAGNOSIS — N186 End stage renal disease: Secondary | ICD-10-CM | POA: Diagnosis not present

## 2017-02-24 DIAGNOSIS — D631 Anemia in chronic kidney disease: Secondary | ICD-10-CM | POA: Diagnosis not present

## 2017-02-24 DIAGNOSIS — Z992 Dependence on renal dialysis: Secondary | ICD-10-CM | POA: Diagnosis not present

## 2017-02-25 DIAGNOSIS — D509 Iron deficiency anemia, unspecified: Secondary | ICD-10-CM | POA: Diagnosis not present

## 2017-02-25 DIAGNOSIS — Z992 Dependence on renal dialysis: Secondary | ICD-10-CM | POA: Diagnosis not present

## 2017-02-25 DIAGNOSIS — D631 Anemia in chronic kidney disease: Secondary | ICD-10-CM | POA: Diagnosis not present

## 2017-02-25 DIAGNOSIS — N186 End stage renal disease: Secondary | ICD-10-CM | POA: Diagnosis not present

## 2017-02-26 DIAGNOSIS — Z992 Dependence on renal dialysis: Secondary | ICD-10-CM | POA: Diagnosis not present

## 2017-02-26 DIAGNOSIS — N186 End stage renal disease: Secondary | ICD-10-CM | POA: Diagnosis not present

## 2017-02-26 DIAGNOSIS — D509 Iron deficiency anemia, unspecified: Secondary | ICD-10-CM | POA: Diagnosis not present

## 2017-02-26 DIAGNOSIS — D631 Anemia in chronic kidney disease: Secondary | ICD-10-CM | POA: Diagnosis not present

## 2017-02-27 DIAGNOSIS — N186 End stage renal disease: Secondary | ICD-10-CM | POA: Diagnosis not present

## 2017-02-27 DIAGNOSIS — D509 Iron deficiency anemia, unspecified: Secondary | ICD-10-CM | POA: Diagnosis not present

## 2017-02-27 DIAGNOSIS — Z992 Dependence on renal dialysis: Secondary | ICD-10-CM | POA: Diagnosis not present

## 2017-02-27 DIAGNOSIS — D631 Anemia in chronic kidney disease: Secondary | ICD-10-CM | POA: Diagnosis not present

## 2017-02-28 DIAGNOSIS — D509 Iron deficiency anemia, unspecified: Secondary | ICD-10-CM | POA: Diagnosis not present

## 2017-02-28 DIAGNOSIS — D631 Anemia in chronic kidney disease: Secondary | ICD-10-CM | POA: Diagnosis not present

## 2017-02-28 DIAGNOSIS — Z992 Dependence on renal dialysis: Secondary | ICD-10-CM | POA: Diagnosis not present

## 2017-02-28 DIAGNOSIS — N186 End stage renal disease: Secondary | ICD-10-CM | POA: Diagnosis not present

## 2017-03-01 DIAGNOSIS — D631 Anemia in chronic kidney disease: Secondary | ICD-10-CM | POA: Diagnosis not present

## 2017-03-01 DIAGNOSIS — Z992 Dependence on renal dialysis: Secondary | ICD-10-CM | POA: Diagnosis not present

## 2017-03-01 DIAGNOSIS — N186 End stage renal disease: Secondary | ICD-10-CM | POA: Diagnosis not present

## 2017-03-01 DIAGNOSIS — D509 Iron deficiency anemia, unspecified: Secondary | ICD-10-CM | POA: Diagnosis not present

## 2017-03-02 DIAGNOSIS — D631 Anemia in chronic kidney disease: Secondary | ICD-10-CM | POA: Diagnosis not present

## 2017-03-02 DIAGNOSIS — N186 End stage renal disease: Secondary | ICD-10-CM | POA: Diagnosis not present

## 2017-03-02 DIAGNOSIS — D509 Iron deficiency anemia, unspecified: Secondary | ICD-10-CM | POA: Diagnosis not present

## 2017-03-02 DIAGNOSIS — Z992 Dependence on renal dialysis: Secondary | ICD-10-CM | POA: Diagnosis not present

## 2017-03-02 NOTE — Progress Notes (Deleted)
Cardiology Office Note Date:  03/02/2017  Patient ID:  Jacqueline, Rodriguez 04/08/1960, MRN [id number] PCP:  Hortencia Pilar, MD  Cardiologist:  Formerly Dr. Yvone Neu, MD  ***refresh   Chief Complaint: ***  History of Present Illness: Jacqueline Rodriguez is a 56 y.o. 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 Yvone Neu though more recently has been seen by Dr. Delight Hoh at [facility] and most recently by Dr. Adolph Pollack through [facility].   Past Medical History:  Diagnosis Date  . Anemia   . Chronic bronchitis ([place])   . Chronic diastolic CHF (congestive heart failure) ([place])   . CKD (chronic kidney disease), stage III ([place])   . COPD (chronic obstructive pulmonary disease) ([place])   . Diabetes mellitus with renal complications ([place])   . Essential hypertension   . GERD (gastroesophageal reflux disease)   . Obesity   . Peripheral vascular disease ([place])   . Pulmonary hypertension ([place])   . Renal insufficiency 09/19/2015   Stage 3 CKD. Beginning dialysis.    Past Surgical History:  Procedure Laterality Date  . A/V FISTULAGRAM N/A 09/16/2016   Procedure: A/V Fistulagram;  Surgeon: Algernon Huxley, MD;  Location: [facility];  Service: Cardiovascular;  Laterality: N/A;  . AV FISTULA PLACEMENT Left 10/01/2015   Procedure: ARTERIOVENOUS (AV) FISTULA CREATION ( BRACHIAL CEPHALIC );  Surgeon: Algernon Huxley, MD;  Location: [facility] OR[facility];  Service: Vascular;  Laterality: Left;  . CAPD INSERTION N/A 06/10/2016   Procedure: LAPAROSCOPIC INSERTION CONTINUOUS AMBULATORY PERITONEAL DIALYSIS  (CAPD) CATHETER;  Surgeon: Algernon Huxley, MD;  Location: [facility];  Service: Vascular;  Laterality: N/A;  . DIALYSIS/PERMA CATHETER INSERTION  09/16/2016   Procedure: DIALYSIS/PERMA  CATHETER INSERTION;  Surgeon: Algernon Huxley, MD;  Location: [place] [facility];  Service: Cardiovascular;;  . DIALYSIS/PERMA CATHETER REMOVAL N/A 12/06/2016   Procedure: DIALYSIS/PERMA CATHETER REMOVAL;  Surgeon: Algernon Huxley, MD;  Location: [facility] [facility];  Service: Cardiovascular;  Laterality: N/A;  . PERIPHERAL VASCULAR CATHETERIZATION N/A 07/21/2015   Procedure: Dialysis/Perma Catheter;  Surgeon: Algernon Huxley, MD;  Location: [facility];  Service: Cardiovascular;  Laterality: N/A;  . PERIPHERAL VASCULAR CATHETERIZATION N/A 12/11/2015   Procedure: Dialysis/Perma Catheter Removal;  Surgeon: Algernon Huxley, MD;  Location: [place] [facility];  Service: Cardiovascular;  Laterality: N/A;    No outpatient medications have been marked as taking for the 03/03/17 encounter (Appointment) with Rise Mu, PA-C.    Allergies:   Ambien [zolpidem]   Social History:  The patient  reports that she quit smoking about 31 years ago. Her smoking use included cigarettes. she has never used smokeless tobacco. She reports that she does not drink alcohol or use drugs.   Family History:  The patient's family history includes CAD in her father; Diabetes Mellitus II in her mother; Heart attack in her father; Hypertension in her father and mother.  ROS:   ROS   PHYSICAL EXAM: *** VS:  LMP 09/18/2013 (Approximate) Comment: years ago per pt prior to 2015 BMI: There is no height or weight on file to calculate BMI.  Physical Exam  Well nourished, well developed, in no acute distress  HEENT: normocephalic, atraumatic  Neck: no JVD, carotid bruits or masses Cardiac: normal S1, S2; RRR; no murmurs, rubs, or gallops  Lungs: clear to auscultation bilaterally, no wheezing, rhonchi or rales  Abd: soft, nontender, no hepatomegaly, + BS MS: no deformity or atrophy Ext: no edema  Skin: warm and dry, no rash Neuro:  moves all extremities spontaneously, no focal abnormalities noted, follows commands Psych:  euthymic mood, full affect   EKG:  Was ordered and interpreted by me today. Shows ***  Recent Labs: 09/08/2016: Magnesium 1.9 12/03/2016: TSH 8.589 12/16/2016: ALT 25; BUN 48; Creatinine, Ser 10.84; Hemoglobin 13.5; Platelets 269; Potassium 3.9; Sodium 136  09/08/2016: Cholesterol 152; HDL 46; LDL Cholesterol 89; Total CHOL/HDL Ratio 3.3; Triglycerides 85; VLDL 17   CrCl cannot be calculated (Patient's most recent lab result is older than the maximum 21 days allowed.).   Wt Readings from Last 3 Encounters:  12/17/16 212 lb 9.6 oz (96.4 kg)  12/04/16 208 lb 14.4 oz (94.8 kg)  09/17/16 237 lb 14 oz (107.9 kg)     Other studies reviewed: Additional studies/records reviewed today include: summarized above  ASSESSMENT AND PLAN:  1. ***  Disposition: F/u with *** in   Current medicines are reviewed at length with the patient today.  The patient did not have any concerns regarding medicines.  Shantice, [place] PA-C 03/02/2017 10:47 AM     [facility] [address] [place] [address] [place], [place] [address] [phone]

## 2017-03-03 ENCOUNTER — Ambulatory Visit: Payer: Medicare Other | Admitting: Physician Assistant

## 2017-03-03 DIAGNOSIS — N186 End stage renal disease: Secondary | ICD-10-CM | POA: Diagnosis not present

## 2017-03-03 DIAGNOSIS — D631 Anemia in chronic kidney disease: Secondary | ICD-10-CM | POA: Diagnosis not present

## 2017-03-03 DIAGNOSIS — Z992 Dependence on renal dialysis: Secondary | ICD-10-CM | POA: Diagnosis not present

## 2017-03-03 DIAGNOSIS — D509 Iron deficiency anemia, unspecified: Secondary | ICD-10-CM | POA: Diagnosis not present

## 2017-03-04 ENCOUNTER — Encounter: Payer: Self-pay | Admitting: Physician Assistant

## 2017-03-04 DIAGNOSIS — Z992 Dependence on renal dialysis: Secondary | ICD-10-CM | POA: Diagnosis not present

## 2017-03-04 DIAGNOSIS — D631 Anemia in chronic kidney disease: Secondary | ICD-10-CM | POA: Diagnosis not present

## 2017-03-04 DIAGNOSIS — D509 Iron deficiency anemia, unspecified: Secondary | ICD-10-CM | POA: Diagnosis not present

## 2017-03-04 DIAGNOSIS — N186 End stage renal disease: Secondary | ICD-10-CM | POA: Diagnosis not present

## 2017-03-05 DIAGNOSIS — N186 End stage renal disease: Secondary | ICD-10-CM | POA: Diagnosis not present

## 2017-03-05 DIAGNOSIS — D509 Iron deficiency anemia, unspecified: Secondary | ICD-10-CM | POA: Diagnosis not present

## 2017-03-05 DIAGNOSIS — D631 Anemia in chronic kidney disease: Secondary | ICD-10-CM | POA: Diagnosis not present

## 2017-03-05 DIAGNOSIS — Z992 Dependence on renal dialysis: Secondary | ICD-10-CM | POA: Diagnosis not present

## 2017-03-06 DIAGNOSIS — D631 Anemia in chronic kidney disease: Secondary | ICD-10-CM | POA: Diagnosis not present

## 2017-03-06 DIAGNOSIS — D509 Iron deficiency anemia, unspecified: Secondary | ICD-10-CM | POA: Diagnosis not present

## 2017-03-06 DIAGNOSIS — Z992 Dependence on renal dialysis: Secondary | ICD-10-CM | POA: Diagnosis not present

## 2017-03-06 DIAGNOSIS — N186 End stage renal disease: Secondary | ICD-10-CM | POA: Diagnosis not present

## 2017-03-07 DIAGNOSIS — N186 End stage renal disease: Secondary | ICD-10-CM | POA: Diagnosis not present

## 2017-03-07 DIAGNOSIS — D509 Iron deficiency anemia, unspecified: Secondary | ICD-10-CM | POA: Diagnosis not present

## 2017-03-07 DIAGNOSIS — D631 Anemia in chronic kidney disease: Secondary | ICD-10-CM | POA: Diagnosis not present

## 2017-03-07 DIAGNOSIS — Z992 Dependence on renal dialysis: Secondary | ICD-10-CM | POA: Diagnosis not present

## 2017-03-08 DIAGNOSIS — Z992 Dependence on renal dialysis: Secondary | ICD-10-CM | POA: Diagnosis not present

## 2017-03-08 DIAGNOSIS — D631 Anemia in chronic kidney disease: Secondary | ICD-10-CM | POA: Diagnosis not present

## 2017-03-08 DIAGNOSIS — N186 End stage renal disease: Secondary | ICD-10-CM | POA: Diagnosis not present

## 2017-03-08 DIAGNOSIS — D509 Iron deficiency anemia, unspecified: Secondary | ICD-10-CM | POA: Diagnosis not present

## 2017-03-09 DIAGNOSIS — D631 Anemia in chronic kidney disease: Secondary | ICD-10-CM | POA: Diagnosis not present

## 2017-03-09 DIAGNOSIS — N186 End stage renal disease: Secondary | ICD-10-CM | POA: Diagnosis not present

## 2017-03-09 DIAGNOSIS — D509 Iron deficiency anemia, unspecified: Secondary | ICD-10-CM | POA: Diagnosis not present

## 2017-03-09 DIAGNOSIS — Z992 Dependence on renal dialysis: Secondary | ICD-10-CM | POA: Diagnosis not present

## 2017-03-10 DIAGNOSIS — Z992 Dependence on renal dialysis: Secondary | ICD-10-CM | POA: Diagnosis not present

## 2017-03-10 DIAGNOSIS — D509 Iron deficiency anemia, unspecified: Secondary | ICD-10-CM | POA: Diagnosis not present

## 2017-03-10 DIAGNOSIS — D631 Anemia in chronic kidney disease: Secondary | ICD-10-CM | POA: Diagnosis not present

## 2017-03-10 DIAGNOSIS — N186 End stage renal disease: Secondary | ICD-10-CM | POA: Diagnosis not present

## 2017-03-11 DIAGNOSIS — D631 Anemia in chronic kidney disease: Secondary | ICD-10-CM | POA: Diagnosis not present

## 2017-03-11 DIAGNOSIS — T85848A Pain due to other internal prosthetic devices, implants and grafts, initial encounter: Secondary | ICD-10-CM | POA: Diagnosis not present

## 2017-03-11 DIAGNOSIS — T85898A Other specified complication of other internal prosthetic devices, implants and grafts, initial encounter: Secondary | ICD-10-CM | POA: Diagnosis not present

## 2017-03-11 DIAGNOSIS — Z992 Dependence on renal dialysis: Secondary | ICD-10-CM | POA: Diagnosis not present

## 2017-03-11 DIAGNOSIS — N186 End stage renal disease: Secondary | ICD-10-CM | POA: Diagnosis not present

## 2017-03-11 DIAGNOSIS — D509 Iron deficiency anemia, unspecified: Secondary | ICD-10-CM | POA: Diagnosis not present

## 2017-03-12 DIAGNOSIS — N186 End stage renal disease: Secondary | ICD-10-CM | POA: Diagnosis not present

## 2017-03-12 DIAGNOSIS — Z992 Dependence on renal dialysis: Secondary | ICD-10-CM | POA: Diagnosis not present

## 2017-03-12 DIAGNOSIS — D509 Iron deficiency anemia, unspecified: Secondary | ICD-10-CM | POA: Diagnosis not present

## 2017-03-12 DIAGNOSIS — D631 Anemia in chronic kidney disease: Secondary | ICD-10-CM | POA: Diagnosis not present

## 2017-03-12 DIAGNOSIS — T85848A Pain due to other internal prosthetic devices, implants and grafts, initial encounter: Secondary | ICD-10-CM | POA: Diagnosis not present

## 2017-03-12 DIAGNOSIS — T85898A Other specified complication of other internal prosthetic devices, implants and grafts, initial encounter: Secondary | ICD-10-CM | POA: Diagnosis not present

## 2017-03-13 DIAGNOSIS — N186 End stage renal disease: Secondary | ICD-10-CM | POA: Diagnosis not present

## 2017-03-13 DIAGNOSIS — T85898A Other specified complication of other internal prosthetic devices, implants and grafts, initial encounter: Secondary | ICD-10-CM | POA: Diagnosis not present

## 2017-03-13 DIAGNOSIS — D509 Iron deficiency anemia, unspecified: Secondary | ICD-10-CM | POA: Diagnosis not present

## 2017-03-13 DIAGNOSIS — Z992 Dependence on renal dialysis: Secondary | ICD-10-CM | POA: Diagnosis not present

## 2017-03-13 DIAGNOSIS — D631 Anemia in chronic kidney disease: Secondary | ICD-10-CM | POA: Diagnosis not present

## 2017-03-13 DIAGNOSIS — T85848A Pain due to other internal prosthetic devices, implants and grafts, initial encounter: Secondary | ICD-10-CM | POA: Diagnosis not present

## 2017-03-14 DIAGNOSIS — D631 Anemia in chronic kidney disease: Secondary | ICD-10-CM | POA: Diagnosis not present

## 2017-03-14 DIAGNOSIS — N186 End stage renal disease: Secondary | ICD-10-CM | POA: Diagnosis not present

## 2017-03-14 DIAGNOSIS — Z992 Dependence on renal dialysis: Secondary | ICD-10-CM | POA: Diagnosis not present

## 2017-03-14 DIAGNOSIS — D509 Iron deficiency anemia, unspecified: Secondary | ICD-10-CM | POA: Diagnosis not present

## 2017-03-14 DIAGNOSIS — T85898A Other specified complication of other internal prosthetic devices, implants and grafts, initial encounter: Secondary | ICD-10-CM | POA: Diagnosis not present

## 2017-03-14 DIAGNOSIS — T85848A Pain due to other internal prosthetic devices, implants and grafts, initial encounter: Secondary | ICD-10-CM | POA: Diagnosis not present

## 2017-03-15 DIAGNOSIS — D631 Anemia in chronic kidney disease: Secondary | ICD-10-CM | POA: Diagnosis not present

## 2017-03-15 DIAGNOSIS — T85898A Other specified complication of other internal prosthetic devices, implants and grafts, initial encounter: Secondary | ICD-10-CM | POA: Diagnosis not present

## 2017-03-15 DIAGNOSIS — D509 Iron deficiency anemia, unspecified: Secondary | ICD-10-CM | POA: Diagnosis not present

## 2017-03-15 DIAGNOSIS — Z992 Dependence on renal dialysis: Secondary | ICD-10-CM | POA: Diagnosis not present

## 2017-03-15 DIAGNOSIS — N186 End stage renal disease: Secondary | ICD-10-CM | POA: Diagnosis not present

## 2017-03-15 DIAGNOSIS — T85848A Pain due to other internal prosthetic devices, implants and grafts, initial encounter: Secondary | ICD-10-CM | POA: Diagnosis not present

## 2017-03-16 DIAGNOSIS — D509 Iron deficiency anemia, unspecified: Secondary | ICD-10-CM | POA: Diagnosis not present

## 2017-03-16 DIAGNOSIS — T85848A Pain due to other internal prosthetic devices, implants and grafts, initial encounter: Secondary | ICD-10-CM | POA: Diagnosis not present

## 2017-03-16 DIAGNOSIS — D631 Anemia in chronic kidney disease: Secondary | ICD-10-CM | POA: Diagnosis not present

## 2017-03-16 DIAGNOSIS — T85898A Other specified complication of other internal prosthetic devices, implants and grafts, initial encounter: Secondary | ICD-10-CM | POA: Diagnosis not present

## 2017-03-16 DIAGNOSIS — N186 End stage renal disease: Secondary | ICD-10-CM | POA: Diagnosis not present

## 2017-03-16 DIAGNOSIS — Z992 Dependence on renal dialysis: Secondary | ICD-10-CM | POA: Diagnosis not present

## 2017-03-17 DIAGNOSIS — T85898A Other specified complication of other internal prosthetic devices, implants and grafts, initial encounter: Secondary | ICD-10-CM | POA: Diagnosis not present

## 2017-03-17 DIAGNOSIS — T85848A Pain due to other internal prosthetic devices, implants and grafts, initial encounter: Secondary | ICD-10-CM | POA: Diagnosis not present

## 2017-03-17 DIAGNOSIS — D631 Anemia in chronic kidney disease: Secondary | ICD-10-CM | POA: Diagnosis not present

## 2017-03-17 DIAGNOSIS — Z992 Dependence on renal dialysis: Secondary | ICD-10-CM | POA: Diagnosis not present

## 2017-03-17 DIAGNOSIS — N186 End stage renal disease: Secondary | ICD-10-CM | POA: Diagnosis not present

## 2017-03-17 DIAGNOSIS — D509 Iron deficiency anemia, unspecified: Secondary | ICD-10-CM | POA: Diagnosis not present

## 2017-03-18 DIAGNOSIS — T85848A Pain due to other internal prosthetic devices, implants and grafts, initial encounter: Secondary | ICD-10-CM | POA: Diagnosis not present

## 2017-03-18 DIAGNOSIS — N186 End stage renal disease: Secondary | ICD-10-CM | POA: Diagnosis not present

## 2017-03-18 DIAGNOSIS — D631 Anemia in chronic kidney disease: Secondary | ICD-10-CM | POA: Diagnosis not present

## 2017-03-18 DIAGNOSIS — D509 Iron deficiency anemia, unspecified: Secondary | ICD-10-CM | POA: Diagnosis not present

## 2017-03-18 DIAGNOSIS — T85898A Other specified complication of other internal prosthetic devices, implants and grafts, initial encounter: Secondary | ICD-10-CM | POA: Diagnosis not present

## 2017-03-18 DIAGNOSIS — Z992 Dependence on renal dialysis: Secondary | ICD-10-CM | POA: Diagnosis not present

## 2017-03-19 DIAGNOSIS — T85848A Pain due to other internal prosthetic devices, implants and grafts, initial encounter: Secondary | ICD-10-CM | POA: Diagnosis not present

## 2017-03-19 DIAGNOSIS — D509 Iron deficiency anemia, unspecified: Secondary | ICD-10-CM | POA: Diagnosis not present

## 2017-03-19 DIAGNOSIS — N186 End stage renal disease: Secondary | ICD-10-CM | POA: Diagnosis not present

## 2017-03-19 DIAGNOSIS — T85898A Other specified complication of other internal prosthetic devices, implants and grafts, initial encounter: Secondary | ICD-10-CM | POA: Diagnosis not present

## 2017-03-19 DIAGNOSIS — D631 Anemia in chronic kidney disease: Secondary | ICD-10-CM | POA: Diagnosis not present

## 2017-03-19 DIAGNOSIS — Z992 Dependence on renal dialysis: Secondary | ICD-10-CM | POA: Diagnosis not present

## 2017-03-20 DIAGNOSIS — D631 Anemia in chronic kidney disease: Secondary | ICD-10-CM | POA: Diagnosis not present

## 2017-03-20 DIAGNOSIS — D509 Iron deficiency anemia, unspecified: Secondary | ICD-10-CM | POA: Diagnosis not present

## 2017-03-20 DIAGNOSIS — Z992 Dependence on renal dialysis: Secondary | ICD-10-CM | POA: Diagnosis not present

## 2017-03-20 DIAGNOSIS — T85848A Pain due to other internal prosthetic devices, implants and grafts, initial encounter: Secondary | ICD-10-CM | POA: Diagnosis not present

## 2017-03-20 DIAGNOSIS — T85898A Other specified complication of other internal prosthetic devices, implants and grafts, initial encounter: Secondary | ICD-10-CM | POA: Diagnosis not present

## 2017-03-20 DIAGNOSIS — N186 End stage renal disease: Secondary | ICD-10-CM | POA: Diagnosis not present

## 2017-03-21 DIAGNOSIS — D509 Iron deficiency anemia, unspecified: Secondary | ICD-10-CM | POA: Diagnosis not present

## 2017-03-21 DIAGNOSIS — N186 End stage renal disease: Secondary | ICD-10-CM | POA: Diagnosis not present

## 2017-03-21 DIAGNOSIS — D631 Anemia in chronic kidney disease: Secondary | ICD-10-CM | POA: Diagnosis not present

## 2017-03-21 DIAGNOSIS — T85898A Other specified complication of other internal prosthetic devices, implants and grafts, initial encounter: Secondary | ICD-10-CM | POA: Diagnosis not present

## 2017-03-21 DIAGNOSIS — Z992 Dependence on renal dialysis: Secondary | ICD-10-CM | POA: Diagnosis not present

## 2017-03-21 DIAGNOSIS — T85848A Pain due to other internal prosthetic devices, implants and grafts, initial encounter: Secondary | ICD-10-CM | POA: Diagnosis not present

## 2017-03-22 DIAGNOSIS — D631 Anemia in chronic kidney disease: Secondary | ICD-10-CM | POA: Diagnosis not present

## 2017-03-22 DIAGNOSIS — Z992 Dependence on renal dialysis: Secondary | ICD-10-CM | POA: Diagnosis not present

## 2017-03-22 DIAGNOSIS — T85898A Other specified complication of other internal prosthetic devices, implants and grafts, initial encounter: Secondary | ICD-10-CM | POA: Diagnosis not present

## 2017-03-22 DIAGNOSIS — T85848A Pain due to other internal prosthetic devices, implants and grafts, initial encounter: Secondary | ICD-10-CM | POA: Diagnosis not present

## 2017-03-22 DIAGNOSIS — N186 End stage renal disease: Secondary | ICD-10-CM | POA: Diagnosis not present

## 2017-03-22 DIAGNOSIS — D509 Iron deficiency anemia, unspecified: Secondary | ICD-10-CM | POA: Diagnosis not present

## 2017-03-23 DIAGNOSIS — N186 End stage renal disease: Secondary | ICD-10-CM | POA: Diagnosis not present

## 2017-03-23 DIAGNOSIS — D631 Anemia in chronic kidney disease: Secondary | ICD-10-CM | POA: Diagnosis not present

## 2017-03-23 DIAGNOSIS — Z992 Dependence on renal dialysis: Secondary | ICD-10-CM | POA: Diagnosis not present

## 2017-03-23 DIAGNOSIS — T85848A Pain due to other internal prosthetic devices, implants and grafts, initial encounter: Secondary | ICD-10-CM | POA: Diagnosis not present

## 2017-03-23 DIAGNOSIS — T85898A Other specified complication of other internal prosthetic devices, implants and grafts, initial encounter: Secondary | ICD-10-CM | POA: Diagnosis not present

## 2017-03-23 DIAGNOSIS — D509 Iron deficiency anemia, unspecified: Secondary | ICD-10-CM | POA: Diagnosis not present

## 2017-03-24 DIAGNOSIS — N186 End stage renal disease: Secondary | ICD-10-CM | POA: Diagnosis not present

## 2017-03-24 DIAGNOSIS — T85898A Other specified complication of other internal prosthetic devices, implants and grafts, initial encounter: Secondary | ICD-10-CM | POA: Diagnosis not present

## 2017-03-24 DIAGNOSIS — D509 Iron deficiency anemia, unspecified: Secondary | ICD-10-CM | POA: Diagnosis not present

## 2017-03-24 DIAGNOSIS — Z992 Dependence on renal dialysis: Secondary | ICD-10-CM | POA: Diagnosis not present

## 2017-03-24 DIAGNOSIS — D631 Anemia in chronic kidney disease: Secondary | ICD-10-CM | POA: Diagnosis not present

## 2017-03-24 DIAGNOSIS — T85848A Pain due to other internal prosthetic devices, implants and grafts, initial encounter: Secondary | ICD-10-CM | POA: Diagnosis not present

## 2017-03-25 DIAGNOSIS — D631 Anemia in chronic kidney disease: Secondary | ICD-10-CM | POA: Diagnosis not present

## 2017-03-25 DIAGNOSIS — T85898A Other specified complication of other internal prosthetic devices, implants and grafts, initial encounter: Secondary | ICD-10-CM | POA: Diagnosis not present

## 2017-03-25 DIAGNOSIS — Z992 Dependence on renal dialysis: Secondary | ICD-10-CM | POA: Diagnosis not present

## 2017-03-25 DIAGNOSIS — D509 Iron deficiency anemia, unspecified: Secondary | ICD-10-CM | POA: Diagnosis not present

## 2017-03-25 DIAGNOSIS — T85848A Pain due to other internal prosthetic devices, implants and grafts, initial encounter: Secondary | ICD-10-CM | POA: Diagnosis not present

## 2017-03-25 DIAGNOSIS — N186 End stage renal disease: Secondary | ICD-10-CM | POA: Diagnosis not present

## 2017-03-26 DIAGNOSIS — T85898A Other specified complication of other internal prosthetic devices, implants and grafts, initial encounter: Secondary | ICD-10-CM | POA: Diagnosis not present

## 2017-03-26 DIAGNOSIS — N186 End stage renal disease: Secondary | ICD-10-CM | POA: Diagnosis not present

## 2017-03-26 DIAGNOSIS — D631 Anemia in chronic kidney disease: Secondary | ICD-10-CM | POA: Diagnosis not present

## 2017-03-26 DIAGNOSIS — T85848A Pain due to other internal prosthetic devices, implants and grafts, initial encounter: Secondary | ICD-10-CM | POA: Diagnosis not present

## 2017-03-26 DIAGNOSIS — Z992 Dependence on renal dialysis: Secondary | ICD-10-CM | POA: Diagnosis not present

## 2017-03-26 DIAGNOSIS — D509 Iron deficiency anemia, unspecified: Secondary | ICD-10-CM | POA: Diagnosis not present

## 2017-03-27 DIAGNOSIS — Z992 Dependence on renal dialysis: Secondary | ICD-10-CM | POA: Diagnosis not present

## 2017-03-27 DIAGNOSIS — T85898A Other specified complication of other internal prosthetic devices, implants and grafts, initial encounter: Secondary | ICD-10-CM | POA: Diagnosis not present

## 2017-03-27 DIAGNOSIS — T85848A Pain due to other internal prosthetic devices, implants and grafts, initial encounter: Secondary | ICD-10-CM | POA: Diagnosis not present

## 2017-03-27 DIAGNOSIS — D631 Anemia in chronic kidney disease: Secondary | ICD-10-CM | POA: Diagnosis not present

## 2017-03-27 DIAGNOSIS — N186 End stage renal disease: Secondary | ICD-10-CM | POA: Diagnosis not present

## 2017-03-27 DIAGNOSIS — D509 Iron deficiency anemia, unspecified: Secondary | ICD-10-CM | POA: Diagnosis not present

## 2017-03-28 DIAGNOSIS — N186 End stage renal disease: Secondary | ICD-10-CM | POA: Diagnosis not present

## 2017-03-28 DIAGNOSIS — D509 Iron deficiency anemia, unspecified: Secondary | ICD-10-CM | POA: Diagnosis not present

## 2017-03-28 DIAGNOSIS — T85848A Pain due to other internal prosthetic devices, implants and grafts, initial encounter: Secondary | ICD-10-CM | POA: Diagnosis not present

## 2017-03-28 DIAGNOSIS — Z992 Dependence on renal dialysis: Secondary | ICD-10-CM | POA: Diagnosis not present

## 2017-03-28 DIAGNOSIS — T85898A Other specified complication of other internal prosthetic devices, implants and grafts, initial encounter: Secondary | ICD-10-CM | POA: Diagnosis not present

## 2017-03-28 DIAGNOSIS — D631 Anemia in chronic kidney disease: Secondary | ICD-10-CM | POA: Diagnosis not present

## 2017-03-29 DIAGNOSIS — T85848A Pain due to other internal prosthetic devices, implants and grafts, initial encounter: Secondary | ICD-10-CM | POA: Diagnosis not present

## 2017-03-29 DIAGNOSIS — N186 End stage renal disease: Secondary | ICD-10-CM | POA: Diagnosis not present

## 2017-03-29 DIAGNOSIS — D631 Anemia in chronic kidney disease: Secondary | ICD-10-CM | POA: Diagnosis not present

## 2017-03-29 DIAGNOSIS — D509 Iron deficiency anemia, unspecified: Secondary | ICD-10-CM | POA: Diagnosis not present

## 2017-03-29 DIAGNOSIS — Z992 Dependence on renal dialysis: Secondary | ICD-10-CM | POA: Diagnosis not present

## 2017-03-29 DIAGNOSIS — T85898A Other specified complication of other internal prosthetic devices, implants and grafts, initial encounter: Secondary | ICD-10-CM | POA: Diagnosis not present

## 2017-03-30 DIAGNOSIS — T85898A Other specified complication of other internal prosthetic devices, implants and grafts, initial encounter: Secondary | ICD-10-CM | POA: Diagnosis not present

## 2017-03-30 DIAGNOSIS — Z992 Dependence on renal dialysis: Secondary | ICD-10-CM | POA: Diagnosis not present

## 2017-03-30 DIAGNOSIS — D509 Iron deficiency anemia, unspecified: Secondary | ICD-10-CM | POA: Diagnosis not present

## 2017-03-30 DIAGNOSIS — N186 End stage renal disease: Secondary | ICD-10-CM | POA: Diagnosis not present

## 2017-03-30 DIAGNOSIS — T85848A Pain due to other internal prosthetic devices, implants and grafts, initial encounter: Secondary | ICD-10-CM | POA: Diagnosis not present

## 2017-03-30 DIAGNOSIS — D631 Anemia in chronic kidney disease: Secondary | ICD-10-CM | POA: Diagnosis not present

## 2017-03-31 DIAGNOSIS — D509 Iron deficiency anemia, unspecified: Secondary | ICD-10-CM | POA: Diagnosis not present

## 2017-03-31 DIAGNOSIS — T85848A Pain due to other internal prosthetic devices, implants and grafts, initial encounter: Secondary | ICD-10-CM | POA: Diagnosis not present

## 2017-03-31 DIAGNOSIS — T85898A Other specified complication of other internal prosthetic devices, implants and grafts, initial encounter: Secondary | ICD-10-CM | POA: Diagnosis not present

## 2017-03-31 DIAGNOSIS — D631 Anemia in chronic kidney disease: Secondary | ICD-10-CM | POA: Diagnosis not present

## 2017-03-31 DIAGNOSIS — Z992 Dependence on renal dialysis: Secondary | ICD-10-CM | POA: Diagnosis not present

## 2017-03-31 DIAGNOSIS — N186 End stage renal disease: Secondary | ICD-10-CM | POA: Diagnosis not present

## 2017-04-01 DIAGNOSIS — D631 Anemia in chronic kidney disease: Secondary | ICD-10-CM | POA: Diagnosis not present

## 2017-04-01 DIAGNOSIS — D509 Iron deficiency anemia, unspecified: Secondary | ICD-10-CM | POA: Diagnosis not present

## 2017-04-01 DIAGNOSIS — T85898A Other specified complication of other internal prosthetic devices, implants and grafts, initial encounter: Secondary | ICD-10-CM | POA: Diagnosis not present

## 2017-04-01 DIAGNOSIS — T85848A Pain due to other internal prosthetic devices, implants and grafts, initial encounter: Secondary | ICD-10-CM | POA: Diagnosis not present

## 2017-04-01 DIAGNOSIS — N186 End stage renal disease: Secondary | ICD-10-CM | POA: Diagnosis not present

## 2017-04-01 DIAGNOSIS — Z992 Dependence on renal dialysis: Secondary | ICD-10-CM | POA: Diagnosis not present

## 2017-04-02 DIAGNOSIS — N186 End stage renal disease: Secondary | ICD-10-CM | POA: Diagnosis not present

## 2017-04-02 DIAGNOSIS — T85848A Pain due to other internal prosthetic devices, implants and grafts, initial encounter: Secondary | ICD-10-CM | POA: Diagnosis not present

## 2017-04-02 DIAGNOSIS — D509 Iron deficiency anemia, unspecified: Secondary | ICD-10-CM | POA: Diagnosis not present

## 2017-04-02 DIAGNOSIS — T85898A Other specified complication of other internal prosthetic devices, implants and grafts, initial encounter: Secondary | ICD-10-CM | POA: Diagnosis not present

## 2017-04-02 DIAGNOSIS — D631 Anemia in chronic kidney disease: Secondary | ICD-10-CM | POA: Diagnosis not present

## 2017-04-02 DIAGNOSIS — Z992 Dependence on renal dialysis: Secondary | ICD-10-CM | POA: Diagnosis not present

## 2017-04-03 DIAGNOSIS — N186 End stage renal disease: Secondary | ICD-10-CM | POA: Diagnosis not present

## 2017-04-03 DIAGNOSIS — Z992 Dependence on renal dialysis: Secondary | ICD-10-CM | POA: Diagnosis not present

## 2017-04-03 DIAGNOSIS — D631 Anemia in chronic kidney disease: Secondary | ICD-10-CM | POA: Diagnosis not present

## 2017-04-03 DIAGNOSIS — T85848A Pain due to other internal prosthetic devices, implants and grafts, initial encounter: Secondary | ICD-10-CM | POA: Diagnosis not present

## 2017-04-03 DIAGNOSIS — D509 Iron deficiency anemia, unspecified: Secondary | ICD-10-CM | POA: Diagnosis not present

## 2017-04-03 DIAGNOSIS — T85898A Other specified complication of other internal prosthetic devices, implants and grafts, initial encounter: Secondary | ICD-10-CM | POA: Diagnosis not present

## 2017-04-04 DIAGNOSIS — D631 Anemia in chronic kidney disease: Secondary | ICD-10-CM | POA: Diagnosis not present

## 2017-04-04 DIAGNOSIS — Z992 Dependence on renal dialysis: Secondary | ICD-10-CM | POA: Diagnosis not present

## 2017-04-04 DIAGNOSIS — N186 End stage renal disease: Secondary | ICD-10-CM | POA: Diagnosis not present

## 2017-04-04 DIAGNOSIS — T85848A Pain due to other internal prosthetic devices, implants and grafts, initial encounter: Secondary | ICD-10-CM | POA: Diagnosis not present

## 2017-04-04 DIAGNOSIS — D509 Iron deficiency anemia, unspecified: Secondary | ICD-10-CM | POA: Diagnosis not present

## 2017-04-04 DIAGNOSIS — T85898A Other specified complication of other internal prosthetic devices, implants and grafts, initial encounter: Secondary | ICD-10-CM | POA: Diagnosis not present

## 2017-04-05 DIAGNOSIS — Z992 Dependence on renal dialysis: Secondary | ICD-10-CM | POA: Diagnosis not present

## 2017-04-05 DIAGNOSIS — T85898A Other specified complication of other internal prosthetic devices, implants and grafts, initial encounter: Secondary | ICD-10-CM | POA: Diagnosis not present

## 2017-04-05 DIAGNOSIS — D631 Anemia in chronic kidney disease: Secondary | ICD-10-CM | POA: Diagnosis not present

## 2017-04-05 DIAGNOSIS — N186 End stage renal disease: Secondary | ICD-10-CM | POA: Diagnosis not present

## 2017-04-05 DIAGNOSIS — T85848A Pain due to other internal prosthetic devices, implants and grafts, initial encounter: Secondary | ICD-10-CM | POA: Diagnosis not present

## 2017-04-05 DIAGNOSIS — D509 Iron deficiency anemia, unspecified: Secondary | ICD-10-CM | POA: Diagnosis not present

## 2017-04-06 DIAGNOSIS — T85848A Pain due to other internal prosthetic devices, implants and grafts, initial encounter: Secondary | ICD-10-CM | POA: Diagnosis not present

## 2017-04-06 DIAGNOSIS — D631 Anemia in chronic kidney disease: Secondary | ICD-10-CM | POA: Diagnosis not present

## 2017-04-06 DIAGNOSIS — N186 End stage renal disease: Secondary | ICD-10-CM | POA: Diagnosis not present

## 2017-04-06 DIAGNOSIS — T85898A Other specified complication of other internal prosthetic devices, implants and grafts, initial encounter: Secondary | ICD-10-CM | POA: Diagnosis not present

## 2017-04-06 DIAGNOSIS — Z992 Dependence on renal dialysis: Secondary | ICD-10-CM | POA: Diagnosis not present

## 2017-04-06 DIAGNOSIS — D509 Iron deficiency anemia, unspecified: Secondary | ICD-10-CM | POA: Diagnosis not present

## 2017-04-07 DIAGNOSIS — N186 End stage renal disease: Secondary | ICD-10-CM | POA: Diagnosis not present

## 2017-04-07 DIAGNOSIS — D509 Iron deficiency anemia, unspecified: Secondary | ICD-10-CM | POA: Diagnosis not present

## 2017-04-07 DIAGNOSIS — T85848A Pain due to other internal prosthetic devices, implants and grafts, initial encounter: Secondary | ICD-10-CM | POA: Diagnosis not present

## 2017-04-07 DIAGNOSIS — D631 Anemia in chronic kidney disease: Secondary | ICD-10-CM | POA: Diagnosis not present

## 2017-04-07 DIAGNOSIS — T85898A Other specified complication of other internal prosthetic devices, implants and grafts, initial encounter: Secondary | ICD-10-CM | POA: Diagnosis not present

## 2017-04-07 DIAGNOSIS — Z992 Dependence on renal dialysis: Secondary | ICD-10-CM | POA: Diagnosis not present

## 2017-04-08 DIAGNOSIS — D631 Anemia in chronic kidney disease: Secondary | ICD-10-CM | POA: Diagnosis not present

## 2017-04-08 DIAGNOSIS — T85898A Other specified complication of other internal prosthetic devices, implants and grafts, initial encounter: Secondary | ICD-10-CM | POA: Diagnosis not present

## 2017-04-08 DIAGNOSIS — N186 End stage renal disease: Secondary | ICD-10-CM | POA: Diagnosis not present

## 2017-04-08 DIAGNOSIS — Z992 Dependence on renal dialysis: Secondary | ICD-10-CM | POA: Diagnosis not present

## 2017-04-08 DIAGNOSIS — T85848A Pain due to other internal prosthetic devices, implants and grafts, initial encounter: Secondary | ICD-10-CM | POA: Diagnosis not present

## 2017-04-08 DIAGNOSIS — D509 Iron deficiency anemia, unspecified: Secondary | ICD-10-CM | POA: Diagnosis not present

## 2017-04-09 DIAGNOSIS — D631 Anemia in chronic kidney disease: Secondary | ICD-10-CM | POA: Diagnosis not present

## 2017-04-09 DIAGNOSIS — T85848A Pain due to other internal prosthetic devices, implants and grafts, initial encounter: Secondary | ICD-10-CM | POA: Diagnosis not present

## 2017-04-09 DIAGNOSIS — D509 Iron deficiency anemia, unspecified: Secondary | ICD-10-CM | POA: Diagnosis not present

## 2017-04-09 DIAGNOSIS — T85898A Other specified complication of other internal prosthetic devices, implants and grafts, initial encounter: Secondary | ICD-10-CM | POA: Diagnosis not present

## 2017-04-09 DIAGNOSIS — Z992 Dependence on renal dialysis: Secondary | ICD-10-CM | POA: Diagnosis not present

## 2017-04-09 DIAGNOSIS — N186 End stage renal disease: Secondary | ICD-10-CM | POA: Diagnosis not present

## 2017-04-10 DIAGNOSIS — N186 End stage renal disease: Secondary | ICD-10-CM | POA: Diagnosis not present

## 2017-04-10 DIAGNOSIS — Z992 Dependence on renal dialysis: Secondary | ICD-10-CM | POA: Diagnosis not present

## 2017-04-10 DIAGNOSIS — D509 Iron deficiency anemia, unspecified: Secondary | ICD-10-CM | POA: Diagnosis not present

## 2017-04-10 DIAGNOSIS — D631 Anemia in chronic kidney disease: Secondary | ICD-10-CM | POA: Diagnosis not present

## 2017-04-10 DIAGNOSIS — T85848A Pain due to other internal prosthetic devices, implants and grafts, initial encounter: Secondary | ICD-10-CM | POA: Diagnosis not present

## 2017-04-10 DIAGNOSIS — T85898A Other specified complication of other internal prosthetic devices, implants and grafts, initial encounter: Secondary | ICD-10-CM | POA: Diagnosis not present

## 2017-04-11 DIAGNOSIS — N186 End stage renal disease: Secondary | ICD-10-CM | POA: Diagnosis not present

## 2017-04-11 DIAGNOSIS — D509 Iron deficiency anemia, unspecified: Secondary | ICD-10-CM | POA: Diagnosis not present

## 2017-04-11 DIAGNOSIS — E559 Vitamin D deficiency, unspecified: Secondary | ICD-10-CM | POA: Diagnosis not present

## 2017-04-11 DIAGNOSIS — Z992 Dependence on renal dialysis: Secondary | ICD-10-CM | POA: Diagnosis not present

## 2017-04-11 DIAGNOSIS — D631 Anemia in chronic kidney disease: Secondary | ICD-10-CM | POA: Diagnosis not present

## 2017-04-12 DIAGNOSIS — D631 Anemia in chronic kidney disease: Secondary | ICD-10-CM | POA: Diagnosis not present

## 2017-04-12 DIAGNOSIS — N186 End stage renal disease: Secondary | ICD-10-CM | POA: Diagnosis not present

## 2017-04-12 DIAGNOSIS — D509 Iron deficiency anemia, unspecified: Secondary | ICD-10-CM | POA: Diagnosis not present

## 2017-04-12 DIAGNOSIS — Z992 Dependence on renal dialysis: Secondary | ICD-10-CM | POA: Diagnosis not present

## 2017-04-12 DIAGNOSIS — E559 Vitamin D deficiency, unspecified: Secondary | ICD-10-CM | POA: Diagnosis not present

## 2017-04-13 DIAGNOSIS — Z992 Dependence on renal dialysis: Secondary | ICD-10-CM | POA: Diagnosis not present

## 2017-04-13 DIAGNOSIS — D631 Anemia in chronic kidney disease: Secondary | ICD-10-CM | POA: Diagnosis not present

## 2017-04-13 DIAGNOSIS — N186 End stage renal disease: Secondary | ICD-10-CM | POA: Diagnosis not present

## 2017-04-13 DIAGNOSIS — D509 Iron deficiency anemia, unspecified: Secondary | ICD-10-CM | POA: Diagnosis not present

## 2017-04-13 DIAGNOSIS — E559 Vitamin D deficiency, unspecified: Secondary | ICD-10-CM | POA: Diagnosis not present

## 2017-04-14 DIAGNOSIS — D631 Anemia in chronic kidney disease: Secondary | ICD-10-CM | POA: Diagnosis not present

## 2017-04-14 DIAGNOSIS — Z992 Dependence on renal dialysis: Secondary | ICD-10-CM | POA: Diagnosis not present

## 2017-04-14 DIAGNOSIS — E559 Vitamin D deficiency, unspecified: Secondary | ICD-10-CM | POA: Diagnosis not present

## 2017-04-14 DIAGNOSIS — D509 Iron deficiency anemia, unspecified: Secondary | ICD-10-CM | POA: Diagnosis not present

## 2017-04-14 DIAGNOSIS — N186 End stage renal disease: Secondary | ICD-10-CM | POA: Diagnosis not present

## 2017-04-15 DIAGNOSIS — D509 Iron deficiency anemia, unspecified: Secondary | ICD-10-CM | POA: Diagnosis not present

## 2017-04-15 DIAGNOSIS — D631 Anemia in chronic kidney disease: Secondary | ICD-10-CM | POA: Diagnosis not present

## 2017-04-15 DIAGNOSIS — Z992 Dependence on renal dialysis: Secondary | ICD-10-CM | POA: Diagnosis not present

## 2017-04-15 DIAGNOSIS — E559 Vitamin D deficiency, unspecified: Secondary | ICD-10-CM | POA: Diagnosis not present

## 2017-04-15 DIAGNOSIS — N186 End stage renal disease: Secondary | ICD-10-CM | POA: Diagnosis not present

## 2017-04-16 DIAGNOSIS — D509 Iron deficiency anemia, unspecified: Secondary | ICD-10-CM | POA: Diagnosis not present

## 2017-04-16 DIAGNOSIS — N186 End stage renal disease: Secondary | ICD-10-CM | POA: Diagnosis not present

## 2017-04-16 DIAGNOSIS — Z992 Dependence on renal dialysis: Secondary | ICD-10-CM | POA: Diagnosis not present

## 2017-04-16 DIAGNOSIS — D631 Anemia in chronic kidney disease: Secondary | ICD-10-CM | POA: Diagnosis not present

## 2017-04-16 DIAGNOSIS — E559 Vitamin D deficiency, unspecified: Secondary | ICD-10-CM | POA: Diagnosis not present

## 2017-04-17 DIAGNOSIS — Z992 Dependence on renal dialysis: Secondary | ICD-10-CM | POA: Diagnosis not present

## 2017-04-17 DIAGNOSIS — D509 Iron deficiency anemia, unspecified: Secondary | ICD-10-CM | POA: Diagnosis not present

## 2017-04-17 DIAGNOSIS — D631 Anemia in chronic kidney disease: Secondary | ICD-10-CM | POA: Diagnosis not present

## 2017-04-17 DIAGNOSIS — E559 Vitamin D deficiency, unspecified: Secondary | ICD-10-CM | POA: Diagnosis not present

## 2017-04-17 DIAGNOSIS — N186 End stage renal disease: Secondary | ICD-10-CM | POA: Diagnosis not present

## 2017-04-18 DIAGNOSIS — D509 Iron deficiency anemia, unspecified: Secondary | ICD-10-CM | POA: Diagnosis not present

## 2017-04-18 DIAGNOSIS — N186 End stage renal disease: Secondary | ICD-10-CM | POA: Diagnosis not present

## 2017-04-18 DIAGNOSIS — D631 Anemia in chronic kidney disease: Secondary | ICD-10-CM | POA: Diagnosis not present

## 2017-04-18 DIAGNOSIS — Z992 Dependence on renal dialysis: Secondary | ICD-10-CM | POA: Diagnosis not present

## 2017-04-18 DIAGNOSIS — E559 Vitamin D deficiency, unspecified: Secondary | ICD-10-CM | POA: Diagnosis not present

## 2017-04-19 DIAGNOSIS — E559 Vitamin D deficiency, unspecified: Secondary | ICD-10-CM | POA: Diagnosis not present

## 2017-04-19 DIAGNOSIS — D631 Anemia in chronic kidney disease: Secondary | ICD-10-CM | POA: Diagnosis not present

## 2017-04-19 DIAGNOSIS — Z992 Dependence on renal dialysis: Secondary | ICD-10-CM | POA: Diagnosis not present

## 2017-04-19 DIAGNOSIS — N186 End stage renal disease: Secondary | ICD-10-CM | POA: Diagnosis not present

## 2017-04-19 DIAGNOSIS — D509 Iron deficiency anemia, unspecified: Secondary | ICD-10-CM | POA: Diagnosis not present

## 2017-04-20 DIAGNOSIS — D631 Anemia in chronic kidney disease: Secondary | ICD-10-CM | POA: Diagnosis not present

## 2017-04-20 DIAGNOSIS — D509 Iron deficiency anemia, unspecified: Secondary | ICD-10-CM | POA: Diagnosis not present

## 2017-04-20 DIAGNOSIS — N186 End stage renal disease: Secondary | ICD-10-CM | POA: Diagnosis not present

## 2017-04-20 DIAGNOSIS — Z992 Dependence on renal dialysis: Secondary | ICD-10-CM | POA: Diagnosis not present

## 2017-04-20 DIAGNOSIS — E559 Vitamin D deficiency, unspecified: Secondary | ICD-10-CM | POA: Diagnosis not present

## 2017-04-21 DIAGNOSIS — M545 Low back pain: Secondary | ICD-10-CM | POA: Diagnosis not present

## 2017-04-21 DIAGNOSIS — Z9181 History of falling: Secondary | ICD-10-CM | POA: Diagnosis not present

## 2017-04-21 DIAGNOSIS — Z87891 Personal history of nicotine dependence: Secondary | ICD-10-CM | POA: Diagnosis not present

## 2017-04-21 DIAGNOSIS — F0390 Unspecified dementia without behavioral disturbance: Secondary | ICD-10-CM | POA: Diagnosis not present

## 2017-04-21 DIAGNOSIS — Z992 Dependence on renal dialysis: Secondary | ICD-10-CM | POA: Diagnosis not present

## 2017-04-21 DIAGNOSIS — D509 Iron deficiency anemia, unspecified: Secondary | ICD-10-CM | POA: Diagnosis not present

## 2017-04-21 DIAGNOSIS — E559 Vitamin D deficiency, unspecified: Secondary | ICD-10-CM | POA: Diagnosis not present

## 2017-04-21 DIAGNOSIS — I120 Hypertensive chronic kidney disease with stage 5 chronic kidney disease or end stage renal disease: Secondary | ICD-10-CM | POA: Diagnosis not present

## 2017-04-21 DIAGNOSIS — M109 Gout, unspecified: Secondary | ICD-10-CM | POA: Diagnosis not present

## 2017-04-21 DIAGNOSIS — D631 Anemia in chronic kidney disease: Secondary | ICD-10-CM | POA: Diagnosis not present

## 2017-04-21 DIAGNOSIS — N186 End stage renal disease: Secondary | ICD-10-CM | POA: Diagnosis not present

## 2017-04-21 DIAGNOSIS — E1122 Type 2 diabetes mellitus with diabetic chronic kidney disease: Secondary | ICD-10-CM | POA: Diagnosis not present

## 2017-04-22 DIAGNOSIS — D631 Anemia in chronic kidney disease: Secondary | ICD-10-CM | POA: Diagnosis not present

## 2017-04-22 DIAGNOSIS — E559 Vitamin D deficiency, unspecified: Secondary | ICD-10-CM | POA: Diagnosis not present

## 2017-04-22 DIAGNOSIS — N186 End stage renal disease: Secondary | ICD-10-CM | POA: Diagnosis not present

## 2017-04-22 DIAGNOSIS — Z992 Dependence on renal dialysis: Secondary | ICD-10-CM | POA: Diagnosis not present

## 2017-04-22 DIAGNOSIS — D509 Iron deficiency anemia, unspecified: Secondary | ICD-10-CM | POA: Diagnosis not present

## 2017-04-23 DIAGNOSIS — D509 Iron deficiency anemia, unspecified: Secondary | ICD-10-CM | POA: Diagnosis not present

## 2017-04-23 DIAGNOSIS — D631 Anemia in chronic kidney disease: Secondary | ICD-10-CM | POA: Diagnosis not present

## 2017-04-23 DIAGNOSIS — E559 Vitamin D deficiency, unspecified: Secondary | ICD-10-CM | POA: Diagnosis not present

## 2017-04-23 DIAGNOSIS — Z992 Dependence on renal dialysis: Secondary | ICD-10-CM | POA: Diagnosis not present

## 2017-04-23 DIAGNOSIS — N186 End stage renal disease: Secondary | ICD-10-CM | POA: Diagnosis not present

## 2017-04-24 DIAGNOSIS — Z992 Dependence on renal dialysis: Secondary | ICD-10-CM | POA: Diagnosis not present

## 2017-04-24 DIAGNOSIS — N186 End stage renal disease: Secondary | ICD-10-CM | POA: Diagnosis not present

## 2017-04-24 DIAGNOSIS — D631 Anemia in chronic kidney disease: Secondary | ICD-10-CM | POA: Diagnosis not present

## 2017-04-24 DIAGNOSIS — D509 Iron deficiency anemia, unspecified: Secondary | ICD-10-CM | POA: Diagnosis not present

## 2017-04-24 DIAGNOSIS — E559 Vitamin D deficiency, unspecified: Secondary | ICD-10-CM | POA: Diagnosis not present

## 2017-04-25 DIAGNOSIS — N186 End stage renal disease: Secondary | ICD-10-CM | POA: Diagnosis not present

## 2017-04-25 DIAGNOSIS — Z992 Dependence on renal dialysis: Secondary | ICD-10-CM | POA: Diagnosis not present

## 2017-04-25 DIAGNOSIS — E559 Vitamin D deficiency, unspecified: Secondary | ICD-10-CM | POA: Diagnosis not present

## 2017-04-25 DIAGNOSIS — D631 Anemia in chronic kidney disease: Secondary | ICD-10-CM | POA: Diagnosis not present

## 2017-04-25 DIAGNOSIS — D509 Iron deficiency anemia, unspecified: Secondary | ICD-10-CM | POA: Diagnosis not present

## 2017-04-26 DIAGNOSIS — Z992 Dependence on renal dialysis: Secondary | ICD-10-CM | POA: Diagnosis not present

## 2017-04-26 DIAGNOSIS — D631 Anemia in chronic kidney disease: Secondary | ICD-10-CM | POA: Diagnosis not present

## 2017-04-26 DIAGNOSIS — N186 End stage renal disease: Secondary | ICD-10-CM | POA: Diagnosis not present

## 2017-04-26 DIAGNOSIS — D509 Iron deficiency anemia, unspecified: Secondary | ICD-10-CM | POA: Diagnosis not present

## 2017-04-26 DIAGNOSIS — E559 Vitamin D deficiency, unspecified: Secondary | ICD-10-CM | POA: Diagnosis not present

## 2017-04-27 DIAGNOSIS — I120 Hypertensive chronic kidney disease with stage 5 chronic kidney disease or end stage renal disease: Secondary | ICD-10-CM | POA: Diagnosis not present

## 2017-04-27 DIAGNOSIS — M545 Low back pain: Secondary | ICD-10-CM | POA: Diagnosis not present

## 2017-04-27 DIAGNOSIS — E1122 Type 2 diabetes mellitus with diabetic chronic kidney disease: Secondary | ICD-10-CM | POA: Diagnosis not present

## 2017-04-27 DIAGNOSIS — D631 Anemia in chronic kidney disease: Secondary | ICD-10-CM | POA: Diagnosis not present

## 2017-04-27 DIAGNOSIS — D509 Iron deficiency anemia, unspecified: Secondary | ICD-10-CM | POA: Diagnosis not present

## 2017-04-27 DIAGNOSIS — Z992 Dependence on renal dialysis: Secondary | ICD-10-CM | POA: Diagnosis not present

## 2017-04-27 DIAGNOSIS — N186 End stage renal disease: Secondary | ICD-10-CM | POA: Diagnosis not present

## 2017-04-27 DIAGNOSIS — M109 Gout, unspecified: Secondary | ICD-10-CM | POA: Diagnosis not present

## 2017-04-27 DIAGNOSIS — E559 Vitamin D deficiency, unspecified: Secondary | ICD-10-CM | POA: Diagnosis not present

## 2017-04-28 DIAGNOSIS — I120 Hypertensive chronic kidney disease with stage 5 chronic kidney disease or end stage renal disease: Secondary | ICD-10-CM | POA: Diagnosis not present

## 2017-04-28 DIAGNOSIS — Z992 Dependence on renal dialysis: Secondary | ICD-10-CM | POA: Diagnosis not present

## 2017-04-28 DIAGNOSIS — M545 Low back pain: Secondary | ICD-10-CM | POA: Diagnosis not present

## 2017-04-28 DIAGNOSIS — D509 Iron deficiency anemia, unspecified: Secondary | ICD-10-CM | POA: Diagnosis not present

## 2017-04-28 DIAGNOSIS — E1122 Type 2 diabetes mellitus with diabetic chronic kidney disease: Secondary | ICD-10-CM | POA: Diagnosis not present

## 2017-04-28 DIAGNOSIS — E119 Type 2 diabetes mellitus without complications: Secondary | ICD-10-CM | POA: Diagnosis not present

## 2017-04-28 DIAGNOSIS — N186 End stage renal disease: Secondary | ICD-10-CM | POA: Diagnosis not present

## 2017-04-28 DIAGNOSIS — M109 Gout, unspecified: Secondary | ICD-10-CM | POA: Diagnosis not present

## 2017-04-28 DIAGNOSIS — E559 Vitamin D deficiency, unspecified: Secondary | ICD-10-CM | POA: Diagnosis not present

## 2017-04-28 DIAGNOSIS — D631 Anemia in chronic kidney disease: Secondary | ICD-10-CM | POA: Diagnosis not present

## 2017-04-29 DIAGNOSIS — I120 Hypertensive chronic kidney disease with stage 5 chronic kidney disease or end stage renal disease: Secondary | ICD-10-CM | POA: Diagnosis not present

## 2017-04-29 DIAGNOSIS — E559 Vitamin D deficiency, unspecified: Secondary | ICD-10-CM | POA: Diagnosis not present

## 2017-04-29 DIAGNOSIS — M109 Gout, unspecified: Secondary | ICD-10-CM | POA: Diagnosis not present

## 2017-04-29 DIAGNOSIS — D631 Anemia in chronic kidney disease: Secondary | ICD-10-CM | POA: Diagnosis not present

## 2017-04-29 DIAGNOSIS — N186 End stage renal disease: Secondary | ICD-10-CM | POA: Diagnosis not present

## 2017-04-29 DIAGNOSIS — E1122 Type 2 diabetes mellitus with diabetic chronic kidney disease: Secondary | ICD-10-CM | POA: Diagnosis not present

## 2017-04-29 DIAGNOSIS — Z992 Dependence on renal dialysis: Secondary | ICD-10-CM | POA: Diagnosis not present

## 2017-04-29 DIAGNOSIS — D509 Iron deficiency anemia, unspecified: Secondary | ICD-10-CM | POA: Diagnosis not present

## 2017-04-29 DIAGNOSIS — M545 Low back pain: Secondary | ICD-10-CM | POA: Diagnosis not present

## 2017-04-30 DIAGNOSIS — I120 Hypertensive chronic kidney disease with stage 5 chronic kidney disease or end stage renal disease: Secondary | ICD-10-CM | POA: Diagnosis not present

## 2017-04-30 DIAGNOSIS — Z992 Dependence on renal dialysis: Secondary | ICD-10-CM | POA: Diagnosis not present

## 2017-04-30 DIAGNOSIS — D509 Iron deficiency anemia, unspecified: Secondary | ICD-10-CM | POA: Diagnosis not present

## 2017-04-30 DIAGNOSIS — M545 Low back pain: Secondary | ICD-10-CM | POA: Diagnosis not present

## 2017-04-30 DIAGNOSIS — E1122 Type 2 diabetes mellitus with diabetic chronic kidney disease: Secondary | ICD-10-CM | POA: Diagnosis not present

## 2017-04-30 DIAGNOSIS — E559 Vitamin D deficiency, unspecified: Secondary | ICD-10-CM | POA: Diagnosis not present

## 2017-04-30 DIAGNOSIS — N186 End stage renal disease: Secondary | ICD-10-CM | POA: Diagnosis not present

## 2017-04-30 DIAGNOSIS — D631 Anemia in chronic kidney disease: Secondary | ICD-10-CM | POA: Diagnosis not present

## 2017-04-30 DIAGNOSIS — M109 Gout, unspecified: Secondary | ICD-10-CM | POA: Diagnosis not present

## 2017-05-01 DIAGNOSIS — E559 Vitamin D deficiency, unspecified: Secondary | ICD-10-CM | POA: Diagnosis not present

## 2017-05-01 DIAGNOSIS — D509 Iron deficiency anemia, unspecified: Secondary | ICD-10-CM | POA: Diagnosis not present

## 2017-05-01 DIAGNOSIS — N186 End stage renal disease: Secondary | ICD-10-CM | POA: Diagnosis not present

## 2017-05-01 DIAGNOSIS — D631 Anemia in chronic kidney disease: Secondary | ICD-10-CM | POA: Diagnosis not present

## 2017-05-01 DIAGNOSIS — Z992 Dependence on renal dialysis: Secondary | ICD-10-CM | POA: Diagnosis not present

## 2017-05-02 DIAGNOSIS — Z992 Dependence on renal dialysis: Secondary | ICD-10-CM | POA: Diagnosis not present

## 2017-05-02 DIAGNOSIS — E1122 Type 2 diabetes mellitus with diabetic chronic kidney disease: Secondary | ICD-10-CM | POA: Diagnosis not present

## 2017-05-02 DIAGNOSIS — E559 Vitamin D deficiency, unspecified: Secondary | ICD-10-CM | POA: Diagnosis not present

## 2017-05-02 DIAGNOSIS — D631 Anemia in chronic kidney disease: Secondary | ICD-10-CM | POA: Diagnosis not present

## 2017-05-02 DIAGNOSIS — I120 Hypertensive chronic kidney disease with stage 5 chronic kidney disease or end stage renal disease: Secondary | ICD-10-CM | POA: Diagnosis not present

## 2017-05-02 DIAGNOSIS — D509 Iron deficiency anemia, unspecified: Secondary | ICD-10-CM | POA: Diagnosis not present

## 2017-05-02 DIAGNOSIS — M109 Gout, unspecified: Secondary | ICD-10-CM | POA: Diagnosis not present

## 2017-05-02 DIAGNOSIS — M545 Low back pain: Secondary | ICD-10-CM | POA: Diagnosis not present

## 2017-05-02 DIAGNOSIS — N186 End stage renal disease: Secondary | ICD-10-CM | POA: Diagnosis not present

## 2017-05-03 DIAGNOSIS — D509 Iron deficiency anemia, unspecified: Secondary | ICD-10-CM | POA: Diagnosis not present

## 2017-05-03 DIAGNOSIS — Z992 Dependence on renal dialysis: Secondary | ICD-10-CM | POA: Diagnosis not present

## 2017-05-03 DIAGNOSIS — E559 Vitamin D deficiency, unspecified: Secondary | ICD-10-CM | POA: Diagnosis not present

## 2017-05-03 DIAGNOSIS — D631 Anemia in chronic kidney disease: Secondary | ICD-10-CM | POA: Diagnosis not present

## 2017-05-03 DIAGNOSIS — N186 End stage renal disease: Secondary | ICD-10-CM | POA: Diagnosis not present

## 2017-05-04 DIAGNOSIS — E559 Vitamin D deficiency, unspecified: Secondary | ICD-10-CM | POA: Diagnosis not present

## 2017-05-04 DIAGNOSIS — D631 Anemia in chronic kidney disease: Secondary | ICD-10-CM | POA: Diagnosis not present

## 2017-05-04 DIAGNOSIS — D509 Iron deficiency anemia, unspecified: Secondary | ICD-10-CM | POA: Diagnosis not present

## 2017-05-04 DIAGNOSIS — N186 End stage renal disease: Secondary | ICD-10-CM | POA: Diagnosis not present

## 2017-05-04 DIAGNOSIS — Z992 Dependence on renal dialysis: Secondary | ICD-10-CM | POA: Diagnosis not present

## 2017-05-05 DIAGNOSIS — N186 End stage renal disease: Secondary | ICD-10-CM | POA: Diagnosis not present

## 2017-05-05 DIAGNOSIS — Z992 Dependence on renal dialysis: Secondary | ICD-10-CM | POA: Diagnosis not present

## 2017-05-05 DIAGNOSIS — M109 Gout, unspecified: Secondary | ICD-10-CM | POA: Diagnosis not present

## 2017-05-05 DIAGNOSIS — E1122 Type 2 diabetes mellitus with diabetic chronic kidney disease: Secondary | ICD-10-CM | POA: Diagnosis not present

## 2017-05-05 DIAGNOSIS — D509 Iron deficiency anemia, unspecified: Secondary | ICD-10-CM | POA: Diagnosis not present

## 2017-05-05 DIAGNOSIS — I120 Hypertensive chronic kidney disease with stage 5 chronic kidney disease or end stage renal disease: Secondary | ICD-10-CM | POA: Diagnosis not present

## 2017-05-05 DIAGNOSIS — E559 Vitamin D deficiency, unspecified: Secondary | ICD-10-CM | POA: Diagnosis not present

## 2017-05-05 DIAGNOSIS — M545 Low back pain: Secondary | ICD-10-CM | POA: Diagnosis not present

## 2017-05-05 DIAGNOSIS — D631 Anemia in chronic kidney disease: Secondary | ICD-10-CM | POA: Diagnosis not present

## 2017-05-06 DIAGNOSIS — N186 End stage renal disease: Secondary | ICD-10-CM | POA: Diagnosis not present

## 2017-05-06 DIAGNOSIS — I120 Hypertensive chronic kidney disease with stage 5 chronic kidney disease or end stage renal disease: Secondary | ICD-10-CM | POA: Diagnosis not present

## 2017-05-06 DIAGNOSIS — E1122 Type 2 diabetes mellitus with diabetic chronic kidney disease: Secondary | ICD-10-CM | POA: Diagnosis not present

## 2017-05-06 DIAGNOSIS — M545 Low back pain: Secondary | ICD-10-CM | POA: Diagnosis not present

## 2017-05-06 DIAGNOSIS — Z992 Dependence on renal dialysis: Secondary | ICD-10-CM | POA: Diagnosis not present

## 2017-05-06 DIAGNOSIS — D631 Anemia in chronic kidney disease: Secondary | ICD-10-CM | POA: Diagnosis not present

## 2017-05-06 DIAGNOSIS — D509 Iron deficiency anemia, unspecified: Secondary | ICD-10-CM | POA: Diagnosis not present

## 2017-05-06 DIAGNOSIS — M109 Gout, unspecified: Secondary | ICD-10-CM | POA: Diagnosis not present

## 2017-05-06 DIAGNOSIS — E559 Vitamin D deficiency, unspecified: Secondary | ICD-10-CM | POA: Diagnosis not present

## 2017-05-07 DIAGNOSIS — E559 Vitamin D deficiency, unspecified: Secondary | ICD-10-CM | POA: Diagnosis not present

## 2017-05-07 DIAGNOSIS — N186 End stage renal disease: Secondary | ICD-10-CM | POA: Diagnosis not present

## 2017-05-07 DIAGNOSIS — D631 Anemia in chronic kidney disease: Secondary | ICD-10-CM | POA: Diagnosis not present

## 2017-05-07 DIAGNOSIS — I120 Hypertensive chronic kidney disease with stage 5 chronic kidney disease or end stage renal disease: Secondary | ICD-10-CM | POA: Diagnosis not present

## 2017-05-07 DIAGNOSIS — M109 Gout, unspecified: Secondary | ICD-10-CM | POA: Diagnosis not present

## 2017-05-07 DIAGNOSIS — M545 Low back pain: Secondary | ICD-10-CM | POA: Diagnosis not present

## 2017-05-07 DIAGNOSIS — E1122 Type 2 diabetes mellitus with diabetic chronic kidney disease: Secondary | ICD-10-CM | POA: Diagnosis not present

## 2017-05-07 DIAGNOSIS — D509 Iron deficiency anemia, unspecified: Secondary | ICD-10-CM | POA: Diagnosis not present

## 2017-05-07 DIAGNOSIS — Z992 Dependence on renal dialysis: Secondary | ICD-10-CM | POA: Diagnosis not present

## 2017-05-08 DIAGNOSIS — D509 Iron deficiency anemia, unspecified: Secondary | ICD-10-CM | POA: Diagnosis not present

## 2017-05-08 DIAGNOSIS — D631 Anemia in chronic kidney disease: Secondary | ICD-10-CM | POA: Diagnosis not present

## 2017-05-08 DIAGNOSIS — N186 End stage renal disease: Secondary | ICD-10-CM | POA: Diagnosis not present

## 2017-05-08 DIAGNOSIS — Z992 Dependence on renal dialysis: Secondary | ICD-10-CM | POA: Diagnosis not present

## 2017-05-08 DIAGNOSIS — E559 Vitamin D deficiency, unspecified: Secondary | ICD-10-CM | POA: Diagnosis not present

## 2017-05-09 DIAGNOSIS — M545 Low back pain: Secondary | ICD-10-CM | POA: Diagnosis not present

## 2017-05-09 DIAGNOSIS — M109 Gout, unspecified: Secondary | ICD-10-CM | POA: Diagnosis not present

## 2017-05-09 DIAGNOSIS — Z992 Dependence on renal dialysis: Secondary | ICD-10-CM | POA: Diagnosis not present

## 2017-05-09 DIAGNOSIS — N186 End stage renal disease: Secondary | ICD-10-CM | POA: Diagnosis not present

## 2017-05-09 DIAGNOSIS — E559 Vitamin D deficiency, unspecified: Secondary | ICD-10-CM | POA: Diagnosis not present

## 2017-05-09 DIAGNOSIS — D631 Anemia in chronic kidney disease: Secondary | ICD-10-CM | POA: Diagnosis not present

## 2017-05-09 DIAGNOSIS — D509 Iron deficiency anemia, unspecified: Secondary | ICD-10-CM | POA: Diagnosis not present

## 2017-05-09 DIAGNOSIS — I120 Hypertensive chronic kidney disease with stage 5 chronic kidney disease or end stage renal disease: Secondary | ICD-10-CM | POA: Diagnosis not present

## 2017-05-09 DIAGNOSIS — E1122 Type 2 diabetes mellitus with diabetic chronic kidney disease: Secondary | ICD-10-CM | POA: Diagnosis not present

## 2017-05-10 DIAGNOSIS — N186 End stage renal disease: Secondary | ICD-10-CM | POA: Diagnosis not present

## 2017-05-10 DIAGNOSIS — D631 Anemia in chronic kidney disease: Secondary | ICD-10-CM | POA: Diagnosis not present

## 2017-05-10 DIAGNOSIS — E559 Vitamin D deficiency, unspecified: Secondary | ICD-10-CM | POA: Diagnosis not present

## 2017-05-10 DIAGNOSIS — D509 Iron deficiency anemia, unspecified: Secondary | ICD-10-CM | POA: Diagnosis not present

## 2017-05-10 DIAGNOSIS — Z992 Dependence on renal dialysis: Secondary | ICD-10-CM | POA: Diagnosis not present

## 2017-05-11 DIAGNOSIS — I120 Hypertensive chronic kidney disease with stage 5 chronic kidney disease or end stage renal disease: Secondary | ICD-10-CM | POA: Diagnosis not present

## 2017-05-11 DIAGNOSIS — E1122 Type 2 diabetes mellitus with diabetic chronic kidney disease: Secondary | ICD-10-CM | POA: Diagnosis not present

## 2017-05-11 DIAGNOSIS — D509 Iron deficiency anemia, unspecified: Secondary | ICD-10-CM | POA: Diagnosis not present

## 2017-05-11 DIAGNOSIS — N186 End stage renal disease: Secondary | ICD-10-CM | POA: Diagnosis not present

## 2017-05-11 DIAGNOSIS — M109 Gout, unspecified: Secondary | ICD-10-CM | POA: Diagnosis not present

## 2017-05-11 DIAGNOSIS — Z992 Dependence on renal dialysis: Secondary | ICD-10-CM | POA: Diagnosis not present

## 2017-05-11 DIAGNOSIS — M545 Low back pain: Secondary | ICD-10-CM | POA: Diagnosis not present

## 2017-05-11 DIAGNOSIS — D631 Anemia in chronic kidney disease: Secondary | ICD-10-CM | POA: Diagnosis not present

## 2017-05-12 DIAGNOSIS — D509 Iron deficiency anemia, unspecified: Secondary | ICD-10-CM | POA: Diagnosis not present

## 2017-05-12 DIAGNOSIS — Z992 Dependence on renal dialysis: Secondary | ICD-10-CM | POA: Diagnosis not present

## 2017-05-12 DIAGNOSIS — M545 Low back pain: Secondary | ICD-10-CM | POA: Diagnosis not present

## 2017-05-12 DIAGNOSIS — M109 Gout, unspecified: Secondary | ICD-10-CM | POA: Diagnosis not present

## 2017-05-12 DIAGNOSIS — D631 Anemia in chronic kidney disease: Secondary | ICD-10-CM | POA: Diagnosis not present

## 2017-05-12 DIAGNOSIS — I120 Hypertensive chronic kidney disease with stage 5 chronic kidney disease or end stage renal disease: Secondary | ICD-10-CM | POA: Diagnosis not present

## 2017-05-12 DIAGNOSIS — E1122 Type 2 diabetes mellitus with diabetic chronic kidney disease: Secondary | ICD-10-CM | POA: Diagnosis not present

## 2017-05-12 DIAGNOSIS — N186 End stage renal disease: Secondary | ICD-10-CM | POA: Diagnosis not present

## 2017-05-13 DIAGNOSIS — D631 Anemia in chronic kidney disease: Secondary | ICD-10-CM | POA: Diagnosis not present

## 2017-05-13 DIAGNOSIS — N186 End stage renal disease: Secondary | ICD-10-CM | POA: Diagnosis not present

## 2017-05-13 DIAGNOSIS — Z992 Dependence on renal dialysis: Secondary | ICD-10-CM | POA: Diagnosis not present

## 2017-05-13 DIAGNOSIS — D509 Iron deficiency anemia, unspecified: Secondary | ICD-10-CM | POA: Diagnosis not present

## 2017-05-14 DIAGNOSIS — N186 End stage renal disease: Secondary | ICD-10-CM | POA: Diagnosis not present

## 2017-05-14 DIAGNOSIS — D509 Iron deficiency anemia, unspecified: Secondary | ICD-10-CM | POA: Diagnosis not present

## 2017-05-14 DIAGNOSIS — Z992 Dependence on renal dialysis: Secondary | ICD-10-CM | POA: Diagnosis not present

## 2017-05-14 DIAGNOSIS — D631 Anemia in chronic kidney disease: Secondary | ICD-10-CM | POA: Diagnosis not present

## 2017-05-15 DIAGNOSIS — N186 End stage renal disease: Secondary | ICD-10-CM | POA: Diagnosis not present

## 2017-05-15 DIAGNOSIS — Z992 Dependence on renal dialysis: Secondary | ICD-10-CM | POA: Diagnosis not present

## 2017-05-15 DIAGNOSIS — D509 Iron deficiency anemia, unspecified: Secondary | ICD-10-CM | POA: Diagnosis not present

## 2017-05-15 DIAGNOSIS — D631 Anemia in chronic kidney disease: Secondary | ICD-10-CM | POA: Diagnosis not present

## 2017-05-16 DIAGNOSIS — N186 End stage renal disease: Secondary | ICD-10-CM | POA: Diagnosis not present

## 2017-05-16 DIAGNOSIS — M545 Low back pain: Secondary | ICD-10-CM | POA: Diagnosis not present

## 2017-05-16 DIAGNOSIS — E1122 Type 2 diabetes mellitus with diabetic chronic kidney disease: Secondary | ICD-10-CM | POA: Diagnosis not present

## 2017-05-16 DIAGNOSIS — Z992 Dependence on renal dialysis: Secondary | ICD-10-CM | POA: Diagnosis not present

## 2017-05-16 DIAGNOSIS — I120 Hypertensive chronic kidney disease with stage 5 chronic kidney disease or end stage renal disease: Secondary | ICD-10-CM | POA: Diagnosis not present

## 2017-05-16 DIAGNOSIS — M109 Gout, unspecified: Secondary | ICD-10-CM | POA: Diagnosis not present

## 2017-05-16 DIAGNOSIS — D509 Iron deficiency anemia, unspecified: Secondary | ICD-10-CM | POA: Diagnosis not present

## 2017-05-16 DIAGNOSIS — D631 Anemia in chronic kidney disease: Secondary | ICD-10-CM | POA: Diagnosis not present

## 2017-05-17 DIAGNOSIS — Z992 Dependence on renal dialysis: Secondary | ICD-10-CM | POA: Diagnosis not present

## 2017-05-17 DIAGNOSIS — N186 End stage renal disease: Secondary | ICD-10-CM | POA: Diagnosis not present

## 2017-05-17 DIAGNOSIS — D631 Anemia in chronic kidney disease: Secondary | ICD-10-CM | POA: Diagnosis not present

## 2017-05-17 DIAGNOSIS — D509 Iron deficiency anemia, unspecified: Secondary | ICD-10-CM | POA: Diagnosis not present

## 2017-05-18 DIAGNOSIS — N186 End stage renal disease: Secondary | ICD-10-CM | POA: Diagnosis not present

## 2017-05-18 DIAGNOSIS — E1122 Type 2 diabetes mellitus with diabetic chronic kidney disease: Secondary | ICD-10-CM | POA: Diagnosis not present

## 2017-05-18 DIAGNOSIS — D631 Anemia in chronic kidney disease: Secondary | ICD-10-CM | POA: Diagnosis not present

## 2017-05-18 DIAGNOSIS — M545 Low back pain: Secondary | ICD-10-CM | POA: Diagnosis not present

## 2017-05-18 DIAGNOSIS — M109 Gout, unspecified: Secondary | ICD-10-CM | POA: Diagnosis not present

## 2017-05-18 DIAGNOSIS — I120 Hypertensive chronic kidney disease with stage 5 chronic kidney disease or end stage renal disease: Secondary | ICD-10-CM | POA: Diagnosis not present

## 2017-05-18 DIAGNOSIS — Z992 Dependence on renal dialysis: Secondary | ICD-10-CM | POA: Diagnosis not present

## 2017-05-18 DIAGNOSIS — D509 Iron deficiency anemia, unspecified: Secondary | ICD-10-CM | POA: Diagnosis not present

## 2017-05-19 DIAGNOSIS — N186 End stage renal disease: Secondary | ICD-10-CM | POA: Diagnosis not present

## 2017-05-19 DIAGNOSIS — D509 Iron deficiency anemia, unspecified: Secondary | ICD-10-CM | POA: Diagnosis not present

## 2017-05-19 DIAGNOSIS — D631 Anemia in chronic kidney disease: Secondary | ICD-10-CM | POA: Diagnosis not present

## 2017-05-19 DIAGNOSIS — I120 Hypertensive chronic kidney disease with stage 5 chronic kidney disease or end stage renal disease: Secondary | ICD-10-CM | POA: Diagnosis not present

## 2017-05-19 DIAGNOSIS — M545 Low back pain: Secondary | ICD-10-CM | POA: Diagnosis not present

## 2017-05-19 DIAGNOSIS — M109 Gout, unspecified: Secondary | ICD-10-CM | POA: Diagnosis not present

## 2017-05-19 DIAGNOSIS — Z992 Dependence on renal dialysis: Secondary | ICD-10-CM | POA: Diagnosis not present

## 2017-05-19 DIAGNOSIS — E1122 Type 2 diabetes mellitus with diabetic chronic kidney disease: Secondary | ICD-10-CM | POA: Diagnosis not present

## 2017-05-20 DIAGNOSIS — N186 End stage renal disease: Secondary | ICD-10-CM | POA: Diagnosis not present

## 2017-05-20 DIAGNOSIS — I120 Hypertensive chronic kidney disease with stage 5 chronic kidney disease or end stage renal disease: Secondary | ICD-10-CM | POA: Diagnosis not present

## 2017-05-20 DIAGNOSIS — D509 Iron deficiency anemia, unspecified: Secondary | ICD-10-CM | POA: Diagnosis not present

## 2017-05-20 DIAGNOSIS — M109 Gout, unspecified: Secondary | ICD-10-CM | POA: Diagnosis not present

## 2017-05-20 DIAGNOSIS — M545 Low back pain: Secondary | ICD-10-CM | POA: Diagnosis not present

## 2017-05-20 DIAGNOSIS — D631 Anemia in chronic kidney disease: Secondary | ICD-10-CM | POA: Diagnosis not present

## 2017-05-20 DIAGNOSIS — E1122 Type 2 diabetes mellitus with diabetic chronic kidney disease: Secondary | ICD-10-CM | POA: Diagnosis not present

## 2017-05-20 DIAGNOSIS — Z992 Dependence on renal dialysis: Secondary | ICD-10-CM | POA: Diagnosis not present

## 2017-05-21 DIAGNOSIS — N186 End stage renal disease: Secondary | ICD-10-CM | POA: Diagnosis not present

## 2017-05-21 DIAGNOSIS — D509 Iron deficiency anemia, unspecified: Secondary | ICD-10-CM | POA: Diagnosis not present

## 2017-05-21 DIAGNOSIS — D631 Anemia in chronic kidney disease: Secondary | ICD-10-CM | POA: Diagnosis not present

## 2017-05-21 DIAGNOSIS — Z992 Dependence on renal dialysis: Secondary | ICD-10-CM | POA: Diagnosis not present

## 2017-05-22 DIAGNOSIS — D509 Iron deficiency anemia, unspecified: Secondary | ICD-10-CM | POA: Diagnosis not present

## 2017-05-22 DIAGNOSIS — D631 Anemia in chronic kidney disease: Secondary | ICD-10-CM | POA: Diagnosis not present

## 2017-05-22 DIAGNOSIS — Z992 Dependence on renal dialysis: Secondary | ICD-10-CM | POA: Diagnosis not present

## 2017-05-22 DIAGNOSIS — N186 End stage renal disease: Secondary | ICD-10-CM | POA: Diagnosis not present

## 2017-05-23 DIAGNOSIS — I120 Hypertensive chronic kidney disease with stage 5 chronic kidney disease or end stage renal disease: Secondary | ICD-10-CM | POA: Diagnosis not present

## 2017-05-23 DIAGNOSIS — M545 Low back pain: Secondary | ICD-10-CM | POA: Diagnosis not present

## 2017-05-23 DIAGNOSIS — Z992 Dependence on renal dialysis: Secondary | ICD-10-CM | POA: Diagnosis not present

## 2017-05-23 DIAGNOSIS — M109 Gout, unspecified: Secondary | ICD-10-CM | POA: Diagnosis not present

## 2017-05-23 DIAGNOSIS — E1122 Type 2 diabetes mellitus with diabetic chronic kidney disease: Secondary | ICD-10-CM | POA: Diagnosis not present

## 2017-05-23 DIAGNOSIS — D509 Iron deficiency anemia, unspecified: Secondary | ICD-10-CM | POA: Diagnosis not present

## 2017-05-23 DIAGNOSIS — N186 End stage renal disease: Secondary | ICD-10-CM | POA: Diagnosis not present

## 2017-05-23 DIAGNOSIS — D631 Anemia in chronic kidney disease: Secondary | ICD-10-CM | POA: Diagnosis not present

## 2017-05-24 DIAGNOSIS — Z992 Dependence on renal dialysis: Secondary | ICD-10-CM | POA: Diagnosis not present

## 2017-05-24 DIAGNOSIS — N186 End stage renal disease: Secondary | ICD-10-CM | POA: Diagnosis not present

## 2017-05-24 DIAGNOSIS — D631 Anemia in chronic kidney disease: Secondary | ICD-10-CM | POA: Diagnosis not present

## 2017-05-24 DIAGNOSIS — D509 Iron deficiency anemia, unspecified: Secondary | ICD-10-CM | POA: Diagnosis not present

## 2017-05-25 DIAGNOSIS — M109 Gout, unspecified: Secondary | ICD-10-CM | POA: Diagnosis not present

## 2017-05-25 DIAGNOSIS — M545 Low back pain: Secondary | ICD-10-CM | POA: Diagnosis not present

## 2017-05-25 DIAGNOSIS — N186 End stage renal disease: Secondary | ICD-10-CM | POA: Diagnosis not present

## 2017-05-25 DIAGNOSIS — D509 Iron deficiency anemia, unspecified: Secondary | ICD-10-CM | POA: Diagnosis not present

## 2017-05-25 DIAGNOSIS — E1122 Type 2 diabetes mellitus with diabetic chronic kidney disease: Secondary | ICD-10-CM | POA: Diagnosis not present

## 2017-05-25 DIAGNOSIS — D631 Anemia in chronic kidney disease: Secondary | ICD-10-CM | POA: Diagnosis not present

## 2017-05-25 DIAGNOSIS — Z992 Dependence on renal dialysis: Secondary | ICD-10-CM | POA: Diagnosis not present

## 2017-05-25 DIAGNOSIS — I120 Hypertensive chronic kidney disease with stage 5 chronic kidney disease or end stage renal disease: Secondary | ICD-10-CM | POA: Diagnosis not present

## 2017-05-26 DIAGNOSIS — M545 Low back pain: Secondary | ICD-10-CM | POA: Diagnosis not present

## 2017-05-26 DIAGNOSIS — N186 End stage renal disease: Secondary | ICD-10-CM | POA: Diagnosis not present

## 2017-05-26 DIAGNOSIS — I120 Hypertensive chronic kidney disease with stage 5 chronic kidney disease or end stage renal disease: Secondary | ICD-10-CM | POA: Diagnosis not present

## 2017-05-26 DIAGNOSIS — Z992 Dependence on renal dialysis: Secondary | ICD-10-CM | POA: Diagnosis not present

## 2017-05-26 DIAGNOSIS — D509 Iron deficiency anemia, unspecified: Secondary | ICD-10-CM | POA: Diagnosis not present

## 2017-05-26 DIAGNOSIS — E1122 Type 2 diabetes mellitus with diabetic chronic kidney disease: Secondary | ICD-10-CM | POA: Diagnosis not present

## 2017-05-26 DIAGNOSIS — D631 Anemia in chronic kidney disease: Secondary | ICD-10-CM | POA: Diagnosis not present

## 2017-05-26 DIAGNOSIS — M109 Gout, unspecified: Secondary | ICD-10-CM | POA: Diagnosis not present

## 2017-05-27 DIAGNOSIS — D631 Anemia in chronic kidney disease: Secondary | ICD-10-CM | POA: Diagnosis not present

## 2017-05-27 DIAGNOSIS — N186 End stage renal disease: Secondary | ICD-10-CM | POA: Diagnosis not present

## 2017-05-27 DIAGNOSIS — Z992 Dependence on renal dialysis: Secondary | ICD-10-CM | POA: Diagnosis not present

## 2017-05-27 DIAGNOSIS — D509 Iron deficiency anemia, unspecified: Secondary | ICD-10-CM | POA: Diagnosis not present

## 2017-05-28 DIAGNOSIS — D631 Anemia in chronic kidney disease: Secondary | ICD-10-CM | POA: Diagnosis not present

## 2017-05-28 DIAGNOSIS — Z992 Dependence on renal dialysis: Secondary | ICD-10-CM | POA: Diagnosis not present

## 2017-05-28 DIAGNOSIS — N186 End stage renal disease: Secondary | ICD-10-CM | POA: Diagnosis not present

## 2017-05-28 DIAGNOSIS — D509 Iron deficiency anemia, unspecified: Secondary | ICD-10-CM | POA: Diagnosis not present

## 2017-05-29 DIAGNOSIS — D631 Anemia in chronic kidney disease: Secondary | ICD-10-CM | POA: Diagnosis not present

## 2017-05-29 DIAGNOSIS — Z992 Dependence on renal dialysis: Secondary | ICD-10-CM | POA: Diagnosis not present

## 2017-05-29 DIAGNOSIS — N186 End stage renal disease: Secondary | ICD-10-CM | POA: Diagnosis not present

## 2017-05-29 DIAGNOSIS — D509 Iron deficiency anemia, unspecified: Secondary | ICD-10-CM | POA: Diagnosis not present

## 2017-05-30 DIAGNOSIS — D509 Iron deficiency anemia, unspecified: Secondary | ICD-10-CM | POA: Diagnosis not present

## 2017-05-30 DIAGNOSIS — Z992 Dependence on renal dialysis: Secondary | ICD-10-CM | POA: Diagnosis not present

## 2017-05-30 DIAGNOSIS — D631 Anemia in chronic kidney disease: Secondary | ICD-10-CM | POA: Diagnosis not present

## 2017-05-30 DIAGNOSIS — N186 End stage renal disease: Secondary | ICD-10-CM | POA: Diagnosis not present

## 2017-05-31 DIAGNOSIS — Z992 Dependence on renal dialysis: Secondary | ICD-10-CM | POA: Diagnosis not present

## 2017-05-31 DIAGNOSIS — D509 Iron deficiency anemia, unspecified: Secondary | ICD-10-CM | POA: Diagnosis not present

## 2017-05-31 DIAGNOSIS — D631 Anemia in chronic kidney disease: Secondary | ICD-10-CM | POA: Diagnosis not present

## 2017-05-31 DIAGNOSIS — N186 End stage renal disease: Secondary | ICD-10-CM | POA: Diagnosis not present

## 2017-06-01 DIAGNOSIS — E1122 Type 2 diabetes mellitus with diabetic chronic kidney disease: Secondary | ICD-10-CM | POA: Diagnosis not present

## 2017-06-01 DIAGNOSIS — M109 Gout, unspecified: Secondary | ICD-10-CM | POA: Diagnosis not present

## 2017-06-01 DIAGNOSIS — I120 Hypertensive chronic kidney disease with stage 5 chronic kidney disease or end stage renal disease: Secondary | ICD-10-CM | POA: Diagnosis not present

## 2017-06-01 DIAGNOSIS — D631 Anemia in chronic kidney disease: Secondary | ICD-10-CM | POA: Diagnosis not present

## 2017-06-01 DIAGNOSIS — M545 Low back pain: Secondary | ICD-10-CM | POA: Diagnosis not present

## 2017-06-01 DIAGNOSIS — D509 Iron deficiency anemia, unspecified: Secondary | ICD-10-CM | POA: Diagnosis not present

## 2017-06-01 DIAGNOSIS — N186 End stage renal disease: Secondary | ICD-10-CM | POA: Diagnosis not present

## 2017-06-01 DIAGNOSIS — Z992 Dependence on renal dialysis: Secondary | ICD-10-CM | POA: Diagnosis not present

## 2017-06-02 DIAGNOSIS — D509 Iron deficiency anemia, unspecified: Secondary | ICD-10-CM | POA: Diagnosis not present

## 2017-06-02 DIAGNOSIS — Z992 Dependence on renal dialysis: Secondary | ICD-10-CM | POA: Diagnosis not present

## 2017-06-02 DIAGNOSIS — D631 Anemia in chronic kidney disease: Secondary | ICD-10-CM | POA: Diagnosis not present

## 2017-06-02 DIAGNOSIS — N186 End stage renal disease: Secondary | ICD-10-CM | POA: Diagnosis not present

## 2017-06-03 DIAGNOSIS — D631 Anemia in chronic kidney disease: Secondary | ICD-10-CM | POA: Diagnosis not present

## 2017-06-03 DIAGNOSIS — I120 Hypertensive chronic kidney disease with stage 5 chronic kidney disease or end stage renal disease: Secondary | ICD-10-CM | POA: Diagnosis not present

## 2017-06-03 DIAGNOSIS — Z992 Dependence on renal dialysis: Secondary | ICD-10-CM | POA: Diagnosis not present

## 2017-06-03 DIAGNOSIS — E1122 Type 2 diabetes mellitus with diabetic chronic kidney disease: Secondary | ICD-10-CM | POA: Diagnosis not present

## 2017-06-03 DIAGNOSIS — M545 Low back pain: Secondary | ICD-10-CM | POA: Diagnosis not present

## 2017-06-03 DIAGNOSIS — M109 Gout, unspecified: Secondary | ICD-10-CM | POA: Diagnosis not present

## 2017-06-03 DIAGNOSIS — N186 End stage renal disease: Secondary | ICD-10-CM | POA: Diagnosis not present

## 2017-06-03 DIAGNOSIS — D509 Iron deficiency anemia, unspecified: Secondary | ICD-10-CM | POA: Diagnosis not present

## 2017-06-04 DIAGNOSIS — N186 End stage renal disease: Secondary | ICD-10-CM | POA: Diagnosis not present

## 2017-06-04 DIAGNOSIS — I120 Hypertensive chronic kidney disease with stage 5 chronic kidney disease or end stage renal disease: Secondary | ICD-10-CM | POA: Diagnosis not present

## 2017-06-04 DIAGNOSIS — M545 Low back pain: Secondary | ICD-10-CM | POA: Diagnosis not present

## 2017-06-04 DIAGNOSIS — D631 Anemia in chronic kidney disease: Secondary | ICD-10-CM | POA: Diagnosis not present

## 2017-06-04 DIAGNOSIS — Z992 Dependence on renal dialysis: Secondary | ICD-10-CM | POA: Diagnosis not present

## 2017-06-04 DIAGNOSIS — M109 Gout, unspecified: Secondary | ICD-10-CM | POA: Diagnosis not present

## 2017-06-04 DIAGNOSIS — D509 Iron deficiency anemia, unspecified: Secondary | ICD-10-CM | POA: Diagnosis not present

## 2017-06-04 DIAGNOSIS — E1122 Type 2 diabetes mellitus with diabetic chronic kidney disease: Secondary | ICD-10-CM | POA: Diagnosis not present

## 2017-06-05 DIAGNOSIS — D509 Iron deficiency anemia, unspecified: Secondary | ICD-10-CM | POA: Diagnosis not present

## 2017-06-05 DIAGNOSIS — Z992 Dependence on renal dialysis: Secondary | ICD-10-CM | POA: Diagnosis not present

## 2017-06-05 DIAGNOSIS — N186 End stage renal disease: Secondary | ICD-10-CM | POA: Diagnosis not present

## 2017-06-05 DIAGNOSIS — D631 Anemia in chronic kidney disease: Secondary | ICD-10-CM | POA: Diagnosis not present

## 2017-06-06 DIAGNOSIS — I120 Hypertensive chronic kidney disease with stage 5 chronic kidney disease or end stage renal disease: Secondary | ICD-10-CM | POA: Diagnosis not present

## 2017-06-06 DIAGNOSIS — D509 Iron deficiency anemia, unspecified: Secondary | ICD-10-CM | POA: Diagnosis not present

## 2017-06-06 DIAGNOSIS — Z992 Dependence on renal dialysis: Secondary | ICD-10-CM | POA: Diagnosis not present

## 2017-06-06 DIAGNOSIS — E1122 Type 2 diabetes mellitus with diabetic chronic kidney disease: Secondary | ICD-10-CM | POA: Diagnosis not present

## 2017-06-06 DIAGNOSIS — N186 End stage renal disease: Secondary | ICD-10-CM | POA: Diagnosis not present

## 2017-06-06 DIAGNOSIS — M545 Low back pain: Secondary | ICD-10-CM | POA: Diagnosis not present

## 2017-06-06 DIAGNOSIS — D631 Anemia in chronic kidney disease: Secondary | ICD-10-CM | POA: Diagnosis not present

## 2017-06-06 DIAGNOSIS — M109 Gout, unspecified: Secondary | ICD-10-CM | POA: Diagnosis not present

## 2017-06-07 DIAGNOSIS — D631 Anemia in chronic kidney disease: Secondary | ICD-10-CM | POA: Diagnosis not present

## 2017-06-07 DIAGNOSIS — D509 Iron deficiency anemia, unspecified: Secondary | ICD-10-CM | POA: Diagnosis not present

## 2017-06-07 DIAGNOSIS — N186 End stage renal disease: Secondary | ICD-10-CM | POA: Diagnosis not present

## 2017-06-07 DIAGNOSIS — Z992 Dependence on renal dialysis: Secondary | ICD-10-CM | POA: Diagnosis not present

## 2017-06-08 DIAGNOSIS — D509 Iron deficiency anemia, unspecified: Secondary | ICD-10-CM | POA: Diagnosis not present

## 2017-06-08 DIAGNOSIS — Z992 Dependence on renal dialysis: Secondary | ICD-10-CM | POA: Diagnosis not present

## 2017-06-08 DIAGNOSIS — D631 Anemia in chronic kidney disease: Secondary | ICD-10-CM | POA: Diagnosis not present

## 2017-06-08 DIAGNOSIS — N186 End stage renal disease: Secondary | ICD-10-CM | POA: Diagnosis not present

## 2017-06-09 DIAGNOSIS — N186 End stage renal disease: Secondary | ICD-10-CM | POA: Diagnosis not present

## 2017-06-09 DIAGNOSIS — Z992 Dependence on renal dialysis: Secondary | ICD-10-CM | POA: Diagnosis not present

## 2017-06-09 DIAGNOSIS — D631 Anemia in chronic kidney disease: Secondary | ICD-10-CM | POA: Diagnosis not present

## 2017-06-09 DIAGNOSIS — D509 Iron deficiency anemia, unspecified: Secondary | ICD-10-CM | POA: Diagnosis not present

## 2017-06-10 DIAGNOSIS — D509 Iron deficiency anemia, unspecified: Secondary | ICD-10-CM | POA: Diagnosis not present

## 2017-06-10 DIAGNOSIS — I120 Hypertensive chronic kidney disease with stage 5 chronic kidney disease or end stage renal disease: Secondary | ICD-10-CM | POA: Diagnosis not present

## 2017-06-10 DIAGNOSIS — Z992 Dependence on renal dialysis: Secondary | ICD-10-CM | POA: Diagnosis not present

## 2017-06-10 DIAGNOSIS — M109 Gout, unspecified: Secondary | ICD-10-CM | POA: Diagnosis not present

## 2017-06-10 DIAGNOSIS — N186 End stage renal disease: Secondary | ICD-10-CM | POA: Diagnosis not present

## 2017-06-10 DIAGNOSIS — D631 Anemia in chronic kidney disease: Secondary | ICD-10-CM | POA: Diagnosis not present

## 2017-06-10 DIAGNOSIS — M545 Low back pain: Secondary | ICD-10-CM | POA: Diagnosis not present

## 2017-06-10 DIAGNOSIS — E1122 Type 2 diabetes mellitus with diabetic chronic kidney disease: Secondary | ICD-10-CM | POA: Diagnosis not present

## 2017-06-11 DIAGNOSIS — D509 Iron deficiency anemia, unspecified: Secondary | ICD-10-CM | POA: Diagnosis not present

## 2017-06-11 DIAGNOSIS — M109 Gout, unspecified: Secondary | ICD-10-CM | POA: Diagnosis not present

## 2017-06-11 DIAGNOSIS — E1122 Type 2 diabetes mellitus with diabetic chronic kidney disease: Secondary | ICD-10-CM | POA: Diagnosis not present

## 2017-06-11 DIAGNOSIS — I120 Hypertensive chronic kidney disease with stage 5 chronic kidney disease or end stage renal disease: Secondary | ICD-10-CM | POA: Diagnosis not present

## 2017-06-11 DIAGNOSIS — M545 Low back pain: Secondary | ICD-10-CM | POA: Diagnosis not present

## 2017-06-11 DIAGNOSIS — D631 Anemia in chronic kidney disease: Secondary | ICD-10-CM | POA: Diagnosis not present

## 2017-06-11 DIAGNOSIS — Z992 Dependence on renal dialysis: Secondary | ICD-10-CM | POA: Diagnosis not present

## 2017-06-11 DIAGNOSIS — N186 End stage renal disease: Secondary | ICD-10-CM | POA: Diagnosis not present

## 2017-06-12 DIAGNOSIS — D509 Iron deficiency anemia, unspecified: Secondary | ICD-10-CM | POA: Diagnosis not present

## 2017-06-12 DIAGNOSIS — D631 Anemia in chronic kidney disease: Secondary | ICD-10-CM | POA: Diagnosis not present

## 2017-06-12 DIAGNOSIS — Z992 Dependence on renal dialysis: Secondary | ICD-10-CM | POA: Diagnosis not present

## 2017-06-12 DIAGNOSIS — N186 End stage renal disease: Secondary | ICD-10-CM | POA: Diagnosis not present

## 2017-06-13 DIAGNOSIS — D509 Iron deficiency anemia, unspecified: Secondary | ICD-10-CM | POA: Diagnosis not present

## 2017-06-13 DIAGNOSIS — D631 Anemia in chronic kidney disease: Secondary | ICD-10-CM | POA: Diagnosis not present

## 2017-06-13 DIAGNOSIS — Z992 Dependence on renal dialysis: Secondary | ICD-10-CM | POA: Diagnosis not present

## 2017-06-13 DIAGNOSIS — N186 End stage renal disease: Secondary | ICD-10-CM | POA: Diagnosis not present

## 2017-06-14 DIAGNOSIS — N186 End stage renal disease: Secondary | ICD-10-CM | POA: Diagnosis not present

## 2017-06-14 DIAGNOSIS — D509 Iron deficiency anemia, unspecified: Secondary | ICD-10-CM | POA: Diagnosis not present

## 2017-06-14 DIAGNOSIS — M109 Gout, unspecified: Secondary | ICD-10-CM | POA: Diagnosis not present

## 2017-06-14 DIAGNOSIS — M545 Low back pain: Secondary | ICD-10-CM | POA: Diagnosis not present

## 2017-06-14 DIAGNOSIS — I120 Hypertensive chronic kidney disease with stage 5 chronic kidney disease or end stage renal disease: Secondary | ICD-10-CM | POA: Diagnosis not present

## 2017-06-14 DIAGNOSIS — Z992 Dependence on renal dialysis: Secondary | ICD-10-CM | POA: Diagnosis not present

## 2017-06-14 DIAGNOSIS — E1122 Type 2 diabetes mellitus with diabetic chronic kidney disease: Secondary | ICD-10-CM | POA: Diagnosis not present

## 2017-06-14 DIAGNOSIS — D631 Anemia in chronic kidney disease: Secondary | ICD-10-CM | POA: Diagnosis not present

## 2017-06-15 DIAGNOSIS — E1122 Type 2 diabetes mellitus with diabetic chronic kidney disease: Secondary | ICD-10-CM | POA: Diagnosis not present

## 2017-06-15 DIAGNOSIS — I120 Hypertensive chronic kidney disease with stage 5 chronic kidney disease or end stage renal disease: Secondary | ICD-10-CM | POA: Diagnosis not present

## 2017-06-15 DIAGNOSIS — Z992 Dependence on renal dialysis: Secondary | ICD-10-CM | POA: Diagnosis not present

## 2017-06-15 DIAGNOSIS — M545 Low back pain: Secondary | ICD-10-CM | POA: Diagnosis not present

## 2017-06-15 DIAGNOSIS — M109 Gout, unspecified: Secondary | ICD-10-CM | POA: Diagnosis not present

## 2017-06-15 DIAGNOSIS — N186 End stage renal disease: Secondary | ICD-10-CM | POA: Diagnosis not present

## 2017-06-15 DIAGNOSIS — D631 Anemia in chronic kidney disease: Secondary | ICD-10-CM | POA: Diagnosis not present

## 2017-06-15 DIAGNOSIS — D509 Iron deficiency anemia, unspecified: Secondary | ICD-10-CM | POA: Diagnosis not present

## 2017-06-16 DIAGNOSIS — D631 Anemia in chronic kidney disease: Secondary | ICD-10-CM | POA: Diagnosis not present

## 2017-06-16 DIAGNOSIS — D509 Iron deficiency anemia, unspecified: Secondary | ICD-10-CM | POA: Diagnosis not present

## 2017-06-16 DIAGNOSIS — N186 End stage renal disease: Secondary | ICD-10-CM | POA: Diagnosis not present

## 2017-06-16 DIAGNOSIS — Z992 Dependence on renal dialysis: Secondary | ICD-10-CM | POA: Diagnosis not present

## 2017-06-17 DIAGNOSIS — D631 Anemia in chronic kidney disease: Secondary | ICD-10-CM | POA: Diagnosis not present

## 2017-06-17 DIAGNOSIS — Z992 Dependence on renal dialysis: Secondary | ICD-10-CM | POA: Diagnosis not present

## 2017-06-17 DIAGNOSIS — N186 End stage renal disease: Secondary | ICD-10-CM | POA: Diagnosis not present

## 2017-06-17 DIAGNOSIS — D509 Iron deficiency anemia, unspecified: Secondary | ICD-10-CM | POA: Diagnosis not present

## 2017-06-18 DIAGNOSIS — N186 End stage renal disease: Secondary | ICD-10-CM | POA: Diagnosis not present

## 2017-06-18 DIAGNOSIS — D631 Anemia in chronic kidney disease: Secondary | ICD-10-CM | POA: Diagnosis not present

## 2017-06-18 DIAGNOSIS — D509 Iron deficiency anemia, unspecified: Secondary | ICD-10-CM | POA: Diagnosis not present

## 2017-06-18 DIAGNOSIS — Z992 Dependence on renal dialysis: Secondary | ICD-10-CM | POA: Diagnosis not present

## 2017-06-19 DIAGNOSIS — N186 End stage renal disease: Secondary | ICD-10-CM | POA: Diagnosis not present

## 2017-06-19 DIAGNOSIS — D509 Iron deficiency anemia, unspecified: Secondary | ICD-10-CM | POA: Diagnosis not present

## 2017-06-19 DIAGNOSIS — Z992 Dependence on renal dialysis: Secondary | ICD-10-CM | POA: Diagnosis not present

## 2017-06-19 DIAGNOSIS — D631 Anemia in chronic kidney disease: Secondary | ICD-10-CM | POA: Diagnosis not present

## 2017-06-20 DIAGNOSIS — M109 Gout, unspecified: Secondary | ICD-10-CM | POA: Diagnosis not present

## 2017-06-20 DIAGNOSIS — E1122 Type 2 diabetes mellitus with diabetic chronic kidney disease: Secondary | ICD-10-CM | POA: Diagnosis not present

## 2017-06-20 DIAGNOSIS — F0390 Unspecified dementia without behavioral disturbance: Secondary | ICD-10-CM | POA: Diagnosis not present

## 2017-06-20 DIAGNOSIS — D509 Iron deficiency anemia, unspecified: Secondary | ICD-10-CM | POA: Diagnosis not present

## 2017-06-20 DIAGNOSIS — I120 Hypertensive chronic kidney disease with stage 5 chronic kidney disease or end stage renal disease: Secondary | ICD-10-CM | POA: Diagnosis not present

## 2017-06-20 DIAGNOSIS — M6281 Muscle weakness (generalized): Secondary | ICD-10-CM | POA: Diagnosis not present

## 2017-06-20 DIAGNOSIS — Z9181 History of falling: Secondary | ICD-10-CM | POA: Diagnosis not present

## 2017-06-20 DIAGNOSIS — Z87891 Personal history of nicotine dependence: Secondary | ICD-10-CM | POA: Diagnosis not present

## 2017-06-20 DIAGNOSIS — D631 Anemia in chronic kidney disease: Secondary | ICD-10-CM | POA: Diagnosis not present

## 2017-06-20 DIAGNOSIS — M545 Low back pain: Secondary | ICD-10-CM | POA: Diagnosis not present

## 2017-06-20 DIAGNOSIS — N186 End stage renal disease: Secondary | ICD-10-CM | POA: Diagnosis not present

## 2017-06-20 DIAGNOSIS — Z992 Dependence on renal dialysis: Secondary | ICD-10-CM | POA: Diagnosis not present

## 2017-06-21 DIAGNOSIS — Z992 Dependence on renal dialysis: Secondary | ICD-10-CM | POA: Diagnosis not present

## 2017-06-21 DIAGNOSIS — D509 Iron deficiency anemia, unspecified: Secondary | ICD-10-CM | POA: Diagnosis not present

## 2017-06-21 DIAGNOSIS — D631 Anemia in chronic kidney disease: Secondary | ICD-10-CM | POA: Diagnosis not present

## 2017-06-21 DIAGNOSIS — N186 End stage renal disease: Secondary | ICD-10-CM | POA: Diagnosis not present

## 2017-06-22 DIAGNOSIS — D631 Anemia in chronic kidney disease: Secondary | ICD-10-CM | POA: Diagnosis not present

## 2017-06-22 DIAGNOSIS — E1122 Type 2 diabetes mellitus with diabetic chronic kidney disease: Secondary | ICD-10-CM | POA: Diagnosis not present

## 2017-06-22 DIAGNOSIS — N186 End stage renal disease: Secondary | ICD-10-CM | POA: Diagnosis not present

## 2017-06-22 DIAGNOSIS — I120 Hypertensive chronic kidney disease with stage 5 chronic kidney disease or end stage renal disease: Secondary | ICD-10-CM | POA: Diagnosis not present

## 2017-06-22 DIAGNOSIS — M6281 Muscle weakness (generalized): Secondary | ICD-10-CM | POA: Diagnosis not present

## 2017-06-22 DIAGNOSIS — M545 Low back pain: Secondary | ICD-10-CM | POA: Diagnosis not present

## 2017-06-22 DIAGNOSIS — D509 Iron deficiency anemia, unspecified: Secondary | ICD-10-CM | POA: Diagnosis not present

## 2017-06-22 DIAGNOSIS — Z992 Dependence on renal dialysis: Secondary | ICD-10-CM | POA: Diagnosis not present

## 2017-06-23 DIAGNOSIS — N186 End stage renal disease: Secondary | ICD-10-CM | POA: Diagnosis not present

## 2017-06-23 DIAGNOSIS — D509 Iron deficiency anemia, unspecified: Secondary | ICD-10-CM | POA: Diagnosis not present

## 2017-06-23 DIAGNOSIS — Z992 Dependence on renal dialysis: Secondary | ICD-10-CM | POA: Diagnosis not present

## 2017-06-23 DIAGNOSIS — D631 Anemia in chronic kidney disease: Secondary | ICD-10-CM | POA: Diagnosis not present

## 2017-06-24 DIAGNOSIS — I120 Hypertensive chronic kidney disease with stage 5 chronic kidney disease or end stage renal disease: Secondary | ICD-10-CM | POA: Diagnosis not present

## 2017-06-24 DIAGNOSIS — M545 Low back pain: Secondary | ICD-10-CM | POA: Diagnosis not present

## 2017-06-24 DIAGNOSIS — N186 End stage renal disease: Secondary | ICD-10-CM | POA: Diagnosis not present

## 2017-06-24 DIAGNOSIS — Z992 Dependence on renal dialysis: Secondary | ICD-10-CM | POA: Diagnosis not present

## 2017-06-24 DIAGNOSIS — M6281 Muscle weakness (generalized): Secondary | ICD-10-CM | POA: Diagnosis not present

## 2017-06-24 DIAGNOSIS — D509 Iron deficiency anemia, unspecified: Secondary | ICD-10-CM | POA: Diagnosis not present

## 2017-06-24 DIAGNOSIS — E1122 Type 2 diabetes mellitus with diabetic chronic kidney disease: Secondary | ICD-10-CM | POA: Diagnosis not present

## 2017-06-24 DIAGNOSIS — D631 Anemia in chronic kidney disease: Secondary | ICD-10-CM | POA: Diagnosis not present

## 2017-06-25 DIAGNOSIS — D631 Anemia in chronic kidney disease: Secondary | ICD-10-CM | POA: Diagnosis not present

## 2017-06-25 DIAGNOSIS — D509 Iron deficiency anemia, unspecified: Secondary | ICD-10-CM | POA: Diagnosis not present

## 2017-06-25 DIAGNOSIS — N186 End stage renal disease: Secondary | ICD-10-CM | POA: Diagnosis not present

## 2017-06-25 DIAGNOSIS — Z992 Dependence on renal dialysis: Secondary | ICD-10-CM | POA: Diagnosis not present

## 2017-06-26 DIAGNOSIS — Z992 Dependence on renal dialysis: Secondary | ICD-10-CM | POA: Diagnosis not present

## 2017-06-26 DIAGNOSIS — N186 End stage renal disease: Secondary | ICD-10-CM | POA: Diagnosis not present

## 2017-06-26 DIAGNOSIS — D509 Iron deficiency anemia, unspecified: Secondary | ICD-10-CM | POA: Diagnosis not present

## 2017-06-26 DIAGNOSIS — D631 Anemia in chronic kidney disease: Secondary | ICD-10-CM | POA: Diagnosis not present

## 2017-06-27 DIAGNOSIS — N186 End stage renal disease: Secondary | ICD-10-CM | POA: Diagnosis not present

## 2017-06-27 DIAGNOSIS — Z992 Dependence on renal dialysis: Secondary | ICD-10-CM | POA: Diagnosis not present

## 2017-06-27 DIAGNOSIS — E1122 Type 2 diabetes mellitus with diabetic chronic kidney disease: Secondary | ICD-10-CM | POA: Diagnosis not present

## 2017-06-27 DIAGNOSIS — I120 Hypertensive chronic kidney disease with stage 5 chronic kidney disease or end stage renal disease: Secondary | ICD-10-CM | POA: Diagnosis not present

## 2017-06-27 DIAGNOSIS — M545 Low back pain: Secondary | ICD-10-CM | POA: Diagnosis not present

## 2017-06-27 DIAGNOSIS — M6281 Muscle weakness (generalized): Secondary | ICD-10-CM | POA: Diagnosis not present

## 2017-06-27 DIAGNOSIS — D631 Anemia in chronic kidney disease: Secondary | ICD-10-CM | POA: Diagnosis not present

## 2017-06-27 DIAGNOSIS — D509 Iron deficiency anemia, unspecified: Secondary | ICD-10-CM | POA: Diagnosis not present

## 2017-06-28 DIAGNOSIS — Z992 Dependence on renal dialysis: Secondary | ICD-10-CM | POA: Diagnosis not present

## 2017-06-28 DIAGNOSIS — N186 End stage renal disease: Secondary | ICD-10-CM | POA: Diagnosis not present

## 2017-06-28 DIAGNOSIS — D631 Anemia in chronic kidney disease: Secondary | ICD-10-CM | POA: Diagnosis not present

## 2017-06-28 DIAGNOSIS — D509 Iron deficiency anemia, unspecified: Secondary | ICD-10-CM | POA: Diagnosis not present

## 2017-06-29 DIAGNOSIS — I120 Hypertensive chronic kidney disease with stage 5 chronic kidney disease or end stage renal disease: Secondary | ICD-10-CM | POA: Diagnosis not present

## 2017-06-29 DIAGNOSIS — Z992 Dependence on renal dialysis: Secondary | ICD-10-CM | POA: Diagnosis not present

## 2017-06-29 DIAGNOSIS — D631 Anemia in chronic kidney disease: Secondary | ICD-10-CM | POA: Diagnosis not present

## 2017-06-29 DIAGNOSIS — M545 Low back pain: Secondary | ICD-10-CM | POA: Diagnosis not present

## 2017-06-29 DIAGNOSIS — E1122 Type 2 diabetes mellitus with diabetic chronic kidney disease: Secondary | ICD-10-CM | POA: Diagnosis not present

## 2017-06-29 DIAGNOSIS — D509 Iron deficiency anemia, unspecified: Secondary | ICD-10-CM | POA: Diagnosis not present

## 2017-06-29 DIAGNOSIS — N186 End stage renal disease: Secondary | ICD-10-CM | POA: Diagnosis not present

## 2017-06-29 DIAGNOSIS — M6281 Muscle weakness (generalized): Secondary | ICD-10-CM | POA: Diagnosis not present

## 2017-06-30 DIAGNOSIS — D509 Iron deficiency anemia, unspecified: Secondary | ICD-10-CM | POA: Diagnosis not present

## 2017-06-30 DIAGNOSIS — N186 End stage renal disease: Secondary | ICD-10-CM | POA: Diagnosis not present

## 2017-06-30 DIAGNOSIS — Z992 Dependence on renal dialysis: Secondary | ICD-10-CM | POA: Diagnosis not present

## 2017-06-30 DIAGNOSIS — D631 Anemia in chronic kidney disease: Secondary | ICD-10-CM | POA: Diagnosis not present

## 2017-07-01 DIAGNOSIS — N186 End stage renal disease: Secondary | ICD-10-CM | POA: Diagnosis not present

## 2017-07-01 DIAGNOSIS — Z992 Dependence on renal dialysis: Secondary | ICD-10-CM | POA: Diagnosis not present

## 2017-07-01 DIAGNOSIS — D631 Anemia in chronic kidney disease: Secondary | ICD-10-CM | POA: Diagnosis not present

## 2017-07-01 DIAGNOSIS — D509 Iron deficiency anemia, unspecified: Secondary | ICD-10-CM | POA: Diagnosis not present

## 2017-07-02 DIAGNOSIS — N186 End stage renal disease: Secondary | ICD-10-CM | POA: Diagnosis not present

## 2017-07-02 DIAGNOSIS — D631 Anemia in chronic kidney disease: Secondary | ICD-10-CM | POA: Diagnosis not present

## 2017-07-02 DIAGNOSIS — D509 Iron deficiency anemia, unspecified: Secondary | ICD-10-CM | POA: Diagnosis not present

## 2017-07-02 DIAGNOSIS — Z992 Dependence on renal dialysis: Secondary | ICD-10-CM | POA: Diagnosis not present

## 2017-07-03 DIAGNOSIS — D631 Anemia in chronic kidney disease: Secondary | ICD-10-CM | POA: Diagnosis not present

## 2017-07-03 DIAGNOSIS — N186 End stage renal disease: Secondary | ICD-10-CM | POA: Diagnosis not present

## 2017-07-03 DIAGNOSIS — D509 Iron deficiency anemia, unspecified: Secondary | ICD-10-CM | POA: Diagnosis not present

## 2017-07-03 DIAGNOSIS — Z992 Dependence on renal dialysis: Secondary | ICD-10-CM | POA: Diagnosis not present

## 2017-07-04 DIAGNOSIS — Z992 Dependence on renal dialysis: Secondary | ICD-10-CM | POA: Diagnosis not present

## 2017-07-04 DIAGNOSIS — M545 Low back pain: Secondary | ICD-10-CM | POA: Diagnosis not present

## 2017-07-04 DIAGNOSIS — D509 Iron deficiency anemia, unspecified: Secondary | ICD-10-CM | POA: Diagnosis not present

## 2017-07-04 DIAGNOSIS — M6281 Muscle weakness (generalized): Secondary | ICD-10-CM | POA: Diagnosis not present

## 2017-07-04 DIAGNOSIS — I120 Hypertensive chronic kidney disease with stage 5 chronic kidney disease or end stage renal disease: Secondary | ICD-10-CM | POA: Diagnosis not present

## 2017-07-04 DIAGNOSIS — E1122 Type 2 diabetes mellitus with diabetic chronic kidney disease: Secondary | ICD-10-CM | POA: Diagnosis not present

## 2017-07-04 DIAGNOSIS — D631 Anemia in chronic kidney disease: Secondary | ICD-10-CM | POA: Diagnosis not present

## 2017-07-04 DIAGNOSIS — N186 End stage renal disease: Secondary | ICD-10-CM | POA: Diagnosis not present

## 2017-07-05 DIAGNOSIS — N186 End stage renal disease: Secondary | ICD-10-CM | POA: Diagnosis not present

## 2017-07-05 DIAGNOSIS — D631 Anemia in chronic kidney disease: Secondary | ICD-10-CM | POA: Diagnosis not present

## 2017-07-05 DIAGNOSIS — D509 Iron deficiency anemia, unspecified: Secondary | ICD-10-CM | POA: Diagnosis not present

## 2017-07-05 DIAGNOSIS — Z992 Dependence on renal dialysis: Secondary | ICD-10-CM | POA: Diagnosis not present

## 2017-07-06 DIAGNOSIS — Z992 Dependence on renal dialysis: Secondary | ICD-10-CM | POA: Diagnosis not present

## 2017-07-06 DIAGNOSIS — D631 Anemia in chronic kidney disease: Secondary | ICD-10-CM | POA: Diagnosis not present

## 2017-07-06 DIAGNOSIS — D509 Iron deficiency anemia, unspecified: Secondary | ICD-10-CM | POA: Diagnosis not present

## 2017-07-06 DIAGNOSIS — N186 End stage renal disease: Secondary | ICD-10-CM | POA: Diagnosis not present

## 2017-07-07 DIAGNOSIS — N186 End stage renal disease: Secondary | ICD-10-CM | POA: Diagnosis not present

## 2017-07-07 DIAGNOSIS — E1122 Type 2 diabetes mellitus with diabetic chronic kidney disease: Secondary | ICD-10-CM | POA: Diagnosis not present

## 2017-07-07 DIAGNOSIS — D631 Anemia in chronic kidney disease: Secondary | ICD-10-CM | POA: Diagnosis not present

## 2017-07-07 DIAGNOSIS — D509 Iron deficiency anemia, unspecified: Secondary | ICD-10-CM | POA: Diagnosis not present

## 2017-07-07 DIAGNOSIS — M6281 Muscle weakness (generalized): Secondary | ICD-10-CM | POA: Diagnosis not present

## 2017-07-07 DIAGNOSIS — M545 Low back pain: Secondary | ICD-10-CM | POA: Diagnosis not present

## 2017-07-07 DIAGNOSIS — Z992 Dependence on renal dialysis: Secondary | ICD-10-CM | POA: Diagnosis not present

## 2017-07-07 DIAGNOSIS — I120 Hypertensive chronic kidney disease with stage 5 chronic kidney disease or end stage renal disease: Secondary | ICD-10-CM | POA: Diagnosis not present

## 2017-07-07 IMAGING — DX DG CHEST 1V PORT
1 series · 1 of 1 positions shown · non-contrast
Comparison: 01/07/2015

CLINICAL DATA: Worsening shortness of breath and hypoxia.

EXAM:
PORTABLE CHEST 1 VIEW

[chest ap]
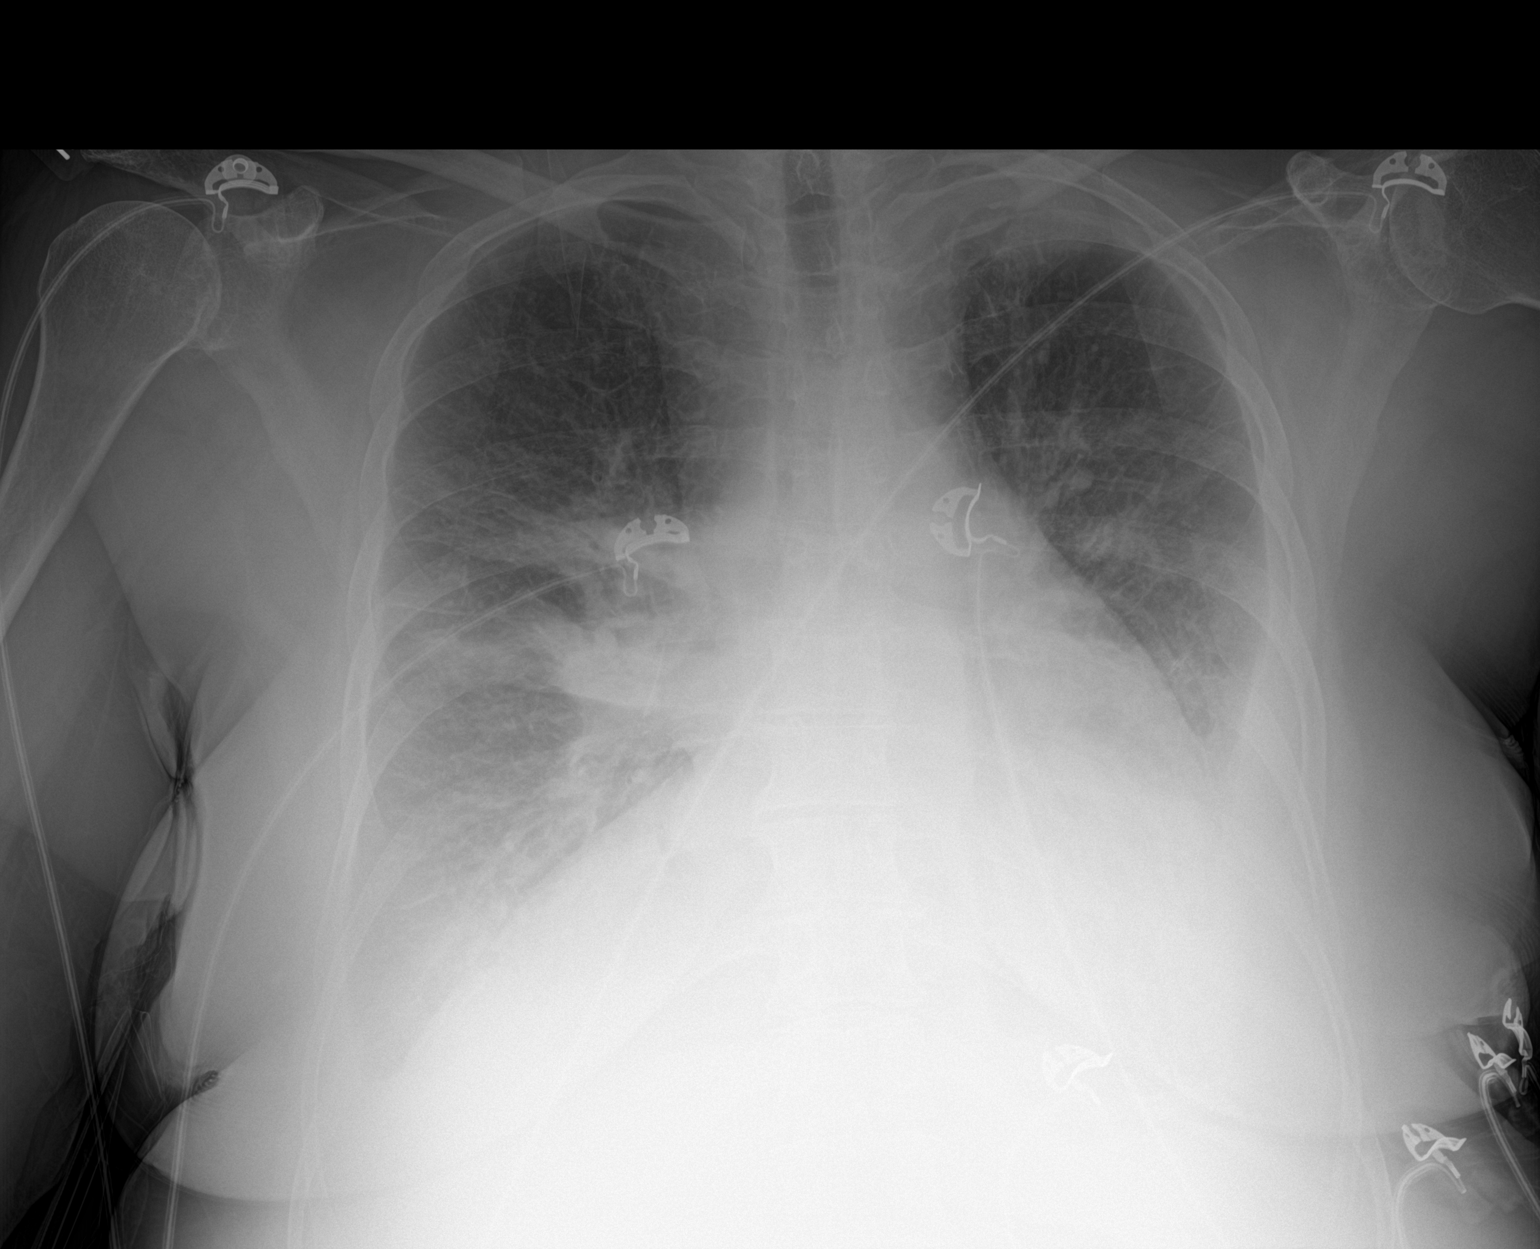

[1 of 1 positions shown; findings below may reference images not displayed]

FINDINGS: Heart size remains at the upper limit of normal. Increased diffuse
interstitial infiltrates are seen with atelectasis or consolidation
in the right perihilar region. Increased bilateral pleural effusions
are noted, left side greater than right.
IMPRESSION: Findings consistent with increased pulmonary edema and bilateral
pleural effusions, likely due to congestive heart failure. Increased
right perihilar atelectasis versus superimposed pneumonia.

## 2017-07-08 DIAGNOSIS — N186 End stage renal disease: Secondary | ICD-10-CM | POA: Diagnosis not present

## 2017-07-08 DIAGNOSIS — Z992 Dependence on renal dialysis: Secondary | ICD-10-CM | POA: Diagnosis not present

## 2017-07-08 DIAGNOSIS — D509 Iron deficiency anemia, unspecified: Secondary | ICD-10-CM | POA: Diagnosis not present

## 2017-07-08 DIAGNOSIS — D631 Anemia in chronic kidney disease: Secondary | ICD-10-CM | POA: Diagnosis not present

## 2017-07-09 DIAGNOSIS — Z992 Dependence on renal dialysis: Secondary | ICD-10-CM | POA: Diagnosis not present

## 2017-07-09 DIAGNOSIS — D631 Anemia in chronic kidney disease: Secondary | ICD-10-CM | POA: Diagnosis not present

## 2017-07-09 DIAGNOSIS — D509 Iron deficiency anemia, unspecified: Secondary | ICD-10-CM | POA: Diagnosis not present

## 2017-07-09 DIAGNOSIS — N186 End stage renal disease: Secondary | ICD-10-CM | POA: Diagnosis not present

## 2017-07-10 DIAGNOSIS — D631 Anemia in chronic kidney disease: Secondary | ICD-10-CM | POA: Diagnosis not present

## 2017-07-10 DIAGNOSIS — D509 Iron deficiency anemia, unspecified: Secondary | ICD-10-CM | POA: Diagnosis not present

## 2017-07-10 DIAGNOSIS — Z992 Dependence on renal dialysis: Secondary | ICD-10-CM | POA: Diagnosis not present

## 2017-07-10 DIAGNOSIS — N186 End stage renal disease: Secondary | ICD-10-CM | POA: Diagnosis not present

## 2017-07-11 DIAGNOSIS — N186 End stage renal disease: Secondary | ICD-10-CM | POA: Diagnosis not present

## 2017-07-11 DIAGNOSIS — Z992 Dependence on renal dialysis: Secondary | ICD-10-CM | POA: Diagnosis not present

## 2017-07-12 ENCOUNTER — Ambulatory Visit: Payer: Medicare Other | Admitting: Nurse Practitioner

## 2017-07-12 DIAGNOSIS — D631 Anemia in chronic kidney disease: Secondary | ICD-10-CM | POA: Diagnosis not present

## 2017-07-12 DIAGNOSIS — Z992 Dependence on renal dialysis: Secondary | ICD-10-CM | POA: Diagnosis not present

## 2017-07-12 DIAGNOSIS — N186 End stage renal disease: Secondary | ICD-10-CM | POA: Diagnosis not present

## 2017-07-12 DIAGNOSIS — M545 Low back pain: Secondary | ICD-10-CM | POA: Diagnosis not present

## 2017-07-12 DIAGNOSIS — I120 Hypertensive chronic kidney disease with stage 5 chronic kidney disease or end stage renal disease: Secondary | ICD-10-CM | POA: Diagnosis not present

## 2017-07-12 DIAGNOSIS — E1122 Type 2 diabetes mellitus with diabetic chronic kidney disease: Secondary | ICD-10-CM | POA: Diagnosis not present

## 2017-07-12 DIAGNOSIS — M6281 Muscle weakness (generalized): Secondary | ICD-10-CM | POA: Diagnosis not present

## 2017-07-13 DIAGNOSIS — N186 End stage renal disease: Secondary | ICD-10-CM | POA: Diagnosis not present

## 2017-07-13 DIAGNOSIS — Z992 Dependence on renal dialysis: Secondary | ICD-10-CM | POA: Diagnosis not present

## 2017-07-14 DIAGNOSIS — Z992 Dependence on renal dialysis: Secondary | ICD-10-CM | POA: Diagnosis not present

## 2017-07-14 DIAGNOSIS — N186 End stage renal disease: Secondary | ICD-10-CM | POA: Diagnosis not present

## 2017-07-15 DIAGNOSIS — M6281 Muscle weakness (generalized): Secondary | ICD-10-CM | POA: Diagnosis not present

## 2017-07-15 DIAGNOSIS — E1122 Type 2 diabetes mellitus with diabetic chronic kidney disease: Secondary | ICD-10-CM | POA: Diagnosis not present

## 2017-07-15 DIAGNOSIS — I120 Hypertensive chronic kidney disease with stage 5 chronic kidney disease or end stage renal disease: Secondary | ICD-10-CM | POA: Diagnosis not present

## 2017-07-15 DIAGNOSIS — Z992 Dependence on renal dialysis: Secondary | ICD-10-CM | POA: Diagnosis not present

## 2017-07-15 DIAGNOSIS — N186 End stage renal disease: Secondary | ICD-10-CM | POA: Diagnosis not present

## 2017-07-15 DIAGNOSIS — M545 Low back pain: Secondary | ICD-10-CM | POA: Diagnosis not present

## 2017-07-15 DIAGNOSIS — D631 Anemia in chronic kidney disease: Secondary | ICD-10-CM | POA: Diagnosis not present

## 2017-07-16 DIAGNOSIS — N186 End stage renal disease: Secondary | ICD-10-CM | POA: Diagnosis not present

## 2017-07-16 DIAGNOSIS — Z992 Dependence on renal dialysis: Secondary | ICD-10-CM | POA: Diagnosis not present

## 2017-07-17 DIAGNOSIS — N186 End stage renal disease: Secondary | ICD-10-CM | POA: Diagnosis not present

## 2017-07-17 DIAGNOSIS — Z992 Dependence on renal dialysis: Secondary | ICD-10-CM | POA: Diagnosis not present

## 2017-07-18 DIAGNOSIS — N186 End stage renal disease: Secondary | ICD-10-CM | POA: Diagnosis not present

## 2017-07-18 DIAGNOSIS — M6281 Muscle weakness (generalized): Secondary | ICD-10-CM | POA: Diagnosis not present

## 2017-07-18 DIAGNOSIS — E1122 Type 2 diabetes mellitus with diabetic chronic kidney disease: Secondary | ICD-10-CM | POA: Diagnosis not present

## 2017-07-18 DIAGNOSIS — D631 Anemia in chronic kidney disease: Secondary | ICD-10-CM | POA: Diagnosis not present

## 2017-07-18 DIAGNOSIS — Z992 Dependence on renal dialysis: Secondary | ICD-10-CM | POA: Diagnosis not present

## 2017-07-18 DIAGNOSIS — I120 Hypertensive chronic kidney disease with stage 5 chronic kidney disease or end stage renal disease: Secondary | ICD-10-CM | POA: Diagnosis not present

## 2017-07-18 DIAGNOSIS — M545 Low back pain: Secondary | ICD-10-CM | POA: Diagnosis not present

## 2017-07-19 DIAGNOSIS — Z992 Dependence on renal dialysis: Secondary | ICD-10-CM | POA: Diagnosis not present

## 2017-07-19 DIAGNOSIS — N186 End stage renal disease: Secondary | ICD-10-CM | POA: Diagnosis not present

## 2017-07-20 DIAGNOSIS — Z992 Dependence on renal dialysis: Secondary | ICD-10-CM | POA: Diagnosis not present

## 2017-07-20 DIAGNOSIS — N186 End stage renal disease: Secondary | ICD-10-CM | POA: Diagnosis not present

## 2017-07-21 DIAGNOSIS — Z992 Dependence on renal dialysis: Secondary | ICD-10-CM | POA: Diagnosis not present

## 2017-07-21 DIAGNOSIS — N186 End stage renal disease: Secondary | ICD-10-CM | POA: Diagnosis not present

## 2017-07-22 DIAGNOSIS — I120 Hypertensive chronic kidney disease with stage 5 chronic kidney disease or end stage renal disease: Secondary | ICD-10-CM | POA: Diagnosis not present

## 2017-07-22 DIAGNOSIS — N186 End stage renal disease: Secondary | ICD-10-CM | POA: Diagnosis not present

## 2017-07-22 DIAGNOSIS — D631 Anemia in chronic kidney disease: Secondary | ICD-10-CM | POA: Diagnosis not present

## 2017-07-22 DIAGNOSIS — M6281 Muscle weakness (generalized): Secondary | ICD-10-CM | POA: Diagnosis not present

## 2017-07-22 DIAGNOSIS — Z992 Dependence on renal dialysis: Secondary | ICD-10-CM | POA: Diagnosis not present

## 2017-07-22 DIAGNOSIS — M545 Low back pain: Secondary | ICD-10-CM | POA: Diagnosis not present

## 2017-07-22 DIAGNOSIS — E1122 Type 2 diabetes mellitus with diabetic chronic kidney disease: Secondary | ICD-10-CM | POA: Diagnosis not present

## 2017-07-23 DIAGNOSIS — Z992 Dependence on renal dialysis: Secondary | ICD-10-CM | POA: Diagnosis not present

## 2017-07-23 DIAGNOSIS — N186 End stage renal disease: Secondary | ICD-10-CM | POA: Diagnosis not present

## 2017-07-24 DIAGNOSIS — Z992 Dependence on renal dialysis: Secondary | ICD-10-CM | POA: Diagnosis not present

## 2017-07-24 DIAGNOSIS — N186 End stage renal disease: Secondary | ICD-10-CM | POA: Diagnosis not present

## 2017-07-25 DIAGNOSIS — M6281 Muscle weakness (generalized): Secondary | ICD-10-CM | POA: Diagnosis not present

## 2017-07-25 DIAGNOSIS — Z992 Dependence on renal dialysis: Secondary | ICD-10-CM | POA: Diagnosis not present

## 2017-07-25 DIAGNOSIS — N186 End stage renal disease: Secondary | ICD-10-CM | POA: Diagnosis not present

## 2017-07-25 DIAGNOSIS — D631 Anemia in chronic kidney disease: Secondary | ICD-10-CM | POA: Diagnosis not present

## 2017-07-25 DIAGNOSIS — E1122 Type 2 diabetes mellitus with diabetic chronic kidney disease: Secondary | ICD-10-CM | POA: Diagnosis not present

## 2017-07-25 DIAGNOSIS — I120 Hypertensive chronic kidney disease with stage 5 chronic kidney disease or end stage renal disease: Secondary | ICD-10-CM | POA: Diagnosis not present

## 2017-07-25 DIAGNOSIS — M545 Low back pain: Secondary | ICD-10-CM | POA: Diagnosis not present

## 2017-07-26 DIAGNOSIS — N186 End stage renal disease: Secondary | ICD-10-CM | POA: Diagnosis not present

## 2017-07-26 DIAGNOSIS — Z992 Dependence on renal dialysis: Secondary | ICD-10-CM | POA: Diagnosis not present

## 2017-07-27 DIAGNOSIS — Z992 Dependence on renal dialysis: Secondary | ICD-10-CM | POA: Diagnosis not present

## 2017-07-27 DIAGNOSIS — N186 End stage renal disease: Secondary | ICD-10-CM | POA: Diagnosis not present

## 2017-07-28 DIAGNOSIS — Z992 Dependence on renal dialysis: Secondary | ICD-10-CM | POA: Diagnosis not present

## 2017-07-28 DIAGNOSIS — N186 End stage renal disease: Secondary | ICD-10-CM | POA: Diagnosis not present

## 2017-07-29 DIAGNOSIS — D631 Anemia in chronic kidney disease: Secondary | ICD-10-CM | POA: Diagnosis not present

## 2017-07-29 DIAGNOSIS — Z992 Dependence on renal dialysis: Secondary | ICD-10-CM | POA: Diagnosis not present

## 2017-07-29 DIAGNOSIS — I120 Hypertensive chronic kidney disease with stage 5 chronic kidney disease or end stage renal disease: Secondary | ICD-10-CM | POA: Diagnosis not present

## 2017-07-29 DIAGNOSIS — M6281 Muscle weakness (generalized): Secondary | ICD-10-CM | POA: Diagnosis not present

## 2017-07-29 DIAGNOSIS — M545 Low back pain: Secondary | ICD-10-CM | POA: Diagnosis not present

## 2017-07-29 DIAGNOSIS — N186 End stage renal disease: Secondary | ICD-10-CM | POA: Diagnosis not present

## 2017-07-29 DIAGNOSIS — E1122 Type 2 diabetes mellitus with diabetic chronic kidney disease: Secondary | ICD-10-CM | POA: Diagnosis not present

## 2017-07-30 DIAGNOSIS — N186 End stage renal disease: Secondary | ICD-10-CM | POA: Diagnosis not present

## 2017-07-30 DIAGNOSIS — Z992 Dependence on renal dialysis: Secondary | ICD-10-CM | POA: Diagnosis not present

## 2017-07-31 DIAGNOSIS — N186 End stage renal disease: Secondary | ICD-10-CM | POA: Diagnosis not present

## 2017-07-31 DIAGNOSIS — Z992 Dependence on renal dialysis: Secondary | ICD-10-CM | POA: Diagnosis not present

## 2017-07-31 NOTE — Progress Notes (Deleted)
Cardiology Office Note  Date:  07/31/2017   ID:  Jacqueline Rodriguez, DOB 1960-09-21, MRN [id number]  PCP:  Hortencia Pilar, MD   No chief complaint on file.   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 Has AV fistula Peritoneal dialysis Chronic diastolic CHF Diabetes Essential hypertension Chronic anemia Seen by Jefm Bryant ardiology in the hospital September 2018 Had junctional rhythm, bradycardia, hyperkalemia   Stress test September 2017 Low risk, ejection fraction 53%  Echocardiogram November 2018 Normal ejection fraction Possible moderate aortic valve stenosis versus left ventricular intracavity gradient   PMH:   has a past medical history of Anemia, Chronic bronchitis ([place]), Chronic diastolic CHF (congestive heart failure) ([place]), CKD (chronic kidney disease), stage III ([place]), COPD (chronic obstructive pulmonary disease) ([place]), Diabetes mellitus with renal complications ([place]), Essential hypertension, GERD (gastroesophageal reflux disease), Obesity, Peripheral vascular disease ([place]), Pulmonary hypertension ([place]), and Renal insufficiency (09/19/2015).  PSH:    Past Surgical History:  Procedure Laterality Date  . A/V FISTULAGRAM N/A 09/16/2016   Procedure: A/V Fistulagram;  Surgeon: Algernon Huxley, MD;  Location: [facility];  Service: Cardiovascular;  Laterality: N/A;  . AV FISTULA PLACEMENT Left 10/01/2015   Procedure: ARTERIOVENOUS (AV) FISTULA CREATION ( BRACHIAL CEPHALIC );  Surgeon: Algernon Huxley, MD;  Location: [facility];  Service: Vascular;  Laterality: Left;  . CAPD INSERTION N/A 06/10/2016   Procedure: LAPAROSCOPIC INSERTION CONTINUOUS AMBULATORY PERITONEAL DIALYSIS  (CAPD) CATHETER;  Surgeon: Algernon Huxley, MD;  Location: [facility];  Service: Vascular;  Laterality: N/A;  . DIALYSIS/PERMA CATHETER INSERTION  09/16/2016   Procedure: DIALYSIS/PERMA CATHETER INSERTION;  Surgeon: Algernon Huxley, MD;  Location: [facility];  Service: Cardiovascular;;  .  DIALYSIS/PERMA CATHETER REMOVAL N/A 12/06/2016   Procedure: DIALYSIS/PERMA CATHETER REMOVAL;  Surgeon: Algernon Huxley, MD;  Location: [facility];  Service: Cardiovascular;  Laterality: N/A;  . PERIPHERAL VASCULAR CATHETERIZATION N/A 07/21/2015   Procedure: Dialysis/Perma Catheter;  Surgeon: Algernon Huxley, MD;  Location: [facility];  Service: Cardiovascular;  Laterality: N/A;  . PERIPHERAL VASCULAR CATHETERIZATION N/A 12/11/2015   Procedure: Dialysis/Perma Catheter Removal;  Surgeon: Algernon Huxley, MD;  Location: [facility];  Service: Cardiovascular;  Laterality: N/A;    Current Outpatient Medications  Medication Sig Dispense Refill  . acetaminophen (TYLENOL) 500 MG tablet Take 500 mg by mouth every 6 (six) hours as needed.    [name] albuterol (PROVENTIL HFA;VENTOLIN HFA) 108 (90 Base) MCG/ACT inhaler Inhale 2 puffs into the lungs every 6 (six) hours as needed for wheezing or shortness of breath. 1 Inhaler 2  . aspirin EC 81 MG EC tablet Take 1 tablet (81 mg total) by mouth daily. (Patient taking differently: Take 162 mg by mouth at bedtime. ) 30 tablet 0  . azithromycin (ZITHROMAX) 250 MG tablet One tablet daily as per DR Ola Spurr infectious disease (Patient not taking: Reported on 12/06/2016) 30 each 0  . calcium acetate (PHOSLO) 667 MG capsule Take 3 capsules (2,001 mg total) by mouth 3 (three) times daily with meals. (Patient not taking: Reported on 12/16/2016) 270 capsule 0  . Calcium-Magnesium-Vitamin D (CALCIUM 1200+D3 PO) Take 1 tablet by mouth daily.    . cetirizine (ZYRTEC) 10 MG tablet Take 10 mg by mouth daily as needed for allergies.    . ferrous sulfate 325 (65 FE) MG EC tablet Take 1 tablet (325 mg total) by mouth 2 (two) times daily. 60 tablet 0  . furosemide (  LASIX) 40 MG tablet Take 1 tablet (40 mg total) by mouth daily. 30 tablet 0  . gabapentin (NEURONTIN) 100 MG capsule Take 100 mg by mouth 3 (three) times daily as needed. For neuropathy.  2  . glipiZIDE  (GLUCOTROL) 5 MG tablet Take 5 mg by mouth daily.    [name] GLUCOSAMINE-CHONDROITIN PO Take 4,000 mg by mouth daily.    . hydrALAZINE (APRESOLINE) 10 MG tablet Take 1 tablet (10 mg total) by mouth 3 (three) times daily. 60 tablet 0  . HYDROcodone-acetaminophen (NORCO) 5-325 MG tablet Take 1 tablet by mouth every 6 (six) hours as needed for moderate pain. 30 tablet 0  . isosorbide mononitrate (IMDUR) 30 MG 24 hr tablet Take 30 mg by mouth daily.    . magnesium oxide (MAG-OX) 400 (241.3 Mg) MG tablet Take 1 tablet (400 mg total) by mouth daily. 30 tablet 0  . meclizine (ANTIVERT) 25 MG tablet Take 1 tablet (25 mg total) by mouth 3 (three) times daily as needed for dizziness. 30 tablet 0  . ondansetron (ZOFRAN) 4 MG tablet Take 1 tablet (4 mg total) by mouth every 6 (six) hours as needed for nausea. 20 tablet 0  . sevelamer carbonate (RENVELA) 800 MG tablet Take 1,600 mg by mouth 3 (three) times daily with meals.     No current facility-administered medications for this visit.      Allergies:   Ambien [zolpidem]   Social History:  The patient  reports that she quit smoking about 32 years ago. Her smoking use included cigarettes. She has never used smokeless tobacco. She reports that she does not drink alcohol or use drugs.   Family History:   family history includes CAD in her father; Diabetes Mellitus II in her mother; Heart attack in her father; Hypertension in her father and mother.    Review of Systems: ROS   PHYSICAL EXAM: VS:  LMP 09/18/2013 (Approximate) Comment: years ago per pt prior to 2015 , BMI There is no height or weight on file to calculate BMI. GEN: Well nourished, well developed, in no acute distress HEENT: normal Neck: no JVD, carotid bruits, or masses Cardiac: RRR; no murmurs, rubs, or gallops,no edema  Respiratory:  clear to auscultation bilaterally, normal work of breathing GI: soft, nontender, nondistended, + BS MS: no deformity or atrophy Skin: warm and dry, no  rash Neuro:  Strength and sensation are intact Psych: euthymic mood, full affect    Recent Labs: 09/08/2016: Magnesium 1.9 12/03/2016: TSH 8.589 12/16/2016: ALT 25; BUN 48; Creatinine, Ser 10.84; Hemoglobin 13.5; Platelets 269; Potassium 3.9; Sodium 136    Lipid Panel Lab Results  Component Value Date   CHOL 152 09/08/2016   HDL 46 09/08/2016   LDLCALC 89 09/08/2016   TRIG 85 09/08/2016      Wt Readings from Last 3 Encounters:  12/17/16 212 lb 9.6 oz (96.4 kg)  12/04/16 208 lb 14.4 oz (94.8 kg)  09/17/16 237 lb 14 oz (107.9 kg)       ASSESSMENT AND PLAN:  No diagnosis found.   Disposition:   F/U  6 months  No orders of the defined types were placed in this encounter.    Signed, Esmond Plants, M.D., Ph.D. 07/31/2017  [place], [place]

## 2017-08-01 DIAGNOSIS — D631 Anemia in chronic kidney disease: Secondary | ICD-10-CM | POA: Diagnosis not present

## 2017-08-01 DIAGNOSIS — M6281 Muscle weakness (generalized): Secondary | ICD-10-CM | POA: Diagnosis not present

## 2017-08-01 DIAGNOSIS — M545 Low back pain: Secondary | ICD-10-CM | POA: Diagnosis not present

## 2017-08-01 DIAGNOSIS — E1122 Type 2 diabetes mellitus with diabetic chronic kidney disease: Secondary | ICD-10-CM | POA: Diagnosis not present

## 2017-08-01 DIAGNOSIS — N186 End stage renal disease: Secondary | ICD-10-CM | POA: Diagnosis not present

## 2017-08-01 DIAGNOSIS — I120 Hypertensive chronic kidney disease with stage 5 chronic kidney disease or end stage renal disease: Secondary | ICD-10-CM | POA: Diagnosis not present

## 2017-08-01 DIAGNOSIS — Z992 Dependence on renal dialysis: Secondary | ICD-10-CM | POA: Diagnosis not present

## 2017-08-02 ENCOUNTER — Ambulatory Visit: Payer: Medicare Other | Admitting: Cardiovascular Disease

## 2017-08-02 DIAGNOSIS — N186 End stage renal disease: Secondary | ICD-10-CM | POA: Diagnosis not present

## 2017-08-02 DIAGNOSIS — Z992 Dependence on renal dialysis: Secondary | ICD-10-CM | POA: Diagnosis not present

## 2017-08-02 DIAGNOSIS — R0989 Other specified symptoms and signs involving the circulatory and respiratory systems: Secondary | ICD-10-CM

## 2017-08-03 DIAGNOSIS — I120 Hypertensive chronic kidney disease with stage 5 chronic kidney disease or end stage renal disease: Secondary | ICD-10-CM | POA: Diagnosis not present

## 2017-08-03 DIAGNOSIS — N186 End stage renal disease: Secondary | ICD-10-CM | POA: Diagnosis not present

## 2017-08-03 DIAGNOSIS — M545 Low back pain: Secondary | ICD-10-CM | POA: Diagnosis not present

## 2017-08-03 DIAGNOSIS — Z992 Dependence on renal dialysis: Secondary | ICD-10-CM | POA: Diagnosis not present

## 2017-08-03 DIAGNOSIS — M6281 Muscle weakness (generalized): Secondary | ICD-10-CM | POA: Diagnosis not present

## 2017-08-03 DIAGNOSIS — E1122 Type 2 diabetes mellitus with diabetic chronic kidney disease: Secondary | ICD-10-CM | POA: Diagnosis not present

## 2017-08-03 DIAGNOSIS — D631 Anemia in chronic kidney disease: Secondary | ICD-10-CM | POA: Diagnosis not present

## 2017-08-04 ENCOUNTER — Encounter: Payer: Self-pay | Admitting: Cardiovascular Disease

## 2017-08-04 DIAGNOSIS — Z992 Dependence on renal dialysis: Secondary | ICD-10-CM | POA: Diagnosis not present

## 2017-08-04 DIAGNOSIS — N186 End stage renal disease: Secondary | ICD-10-CM | POA: Diagnosis not present

## 2017-08-05 DIAGNOSIS — N186 End stage renal disease: Secondary | ICD-10-CM | POA: Diagnosis not present

## 2017-08-05 DIAGNOSIS — Z992 Dependence on renal dialysis: Secondary | ICD-10-CM | POA: Diagnosis not present

## 2017-08-06 DIAGNOSIS — Z992 Dependence on renal dialysis: Secondary | ICD-10-CM | POA: Diagnosis not present

## 2017-08-06 DIAGNOSIS — N186 End stage renal disease: Secondary | ICD-10-CM | POA: Diagnosis not present

## 2017-08-07 DIAGNOSIS — Z992 Dependence on renal dialysis: Secondary | ICD-10-CM | POA: Diagnosis not present

## 2017-08-07 DIAGNOSIS — N186 End stage renal disease: Secondary | ICD-10-CM | POA: Diagnosis not present

## 2017-08-08 DIAGNOSIS — E1122 Type 2 diabetes mellitus with diabetic chronic kidney disease: Secondary | ICD-10-CM | POA: Diagnosis not present

## 2017-08-08 DIAGNOSIS — Z992 Dependence on renal dialysis: Secondary | ICD-10-CM | POA: Diagnosis not present

## 2017-08-08 DIAGNOSIS — I120 Hypertensive chronic kidney disease with stage 5 chronic kidney disease or end stage renal disease: Secondary | ICD-10-CM | POA: Diagnosis not present

## 2017-08-08 DIAGNOSIS — N186 End stage renal disease: Secondary | ICD-10-CM | POA: Diagnosis not present

## 2017-08-08 DIAGNOSIS — M6281 Muscle weakness (generalized): Secondary | ICD-10-CM | POA: Diagnosis not present

## 2017-08-08 DIAGNOSIS — M545 Low back pain: Secondary | ICD-10-CM | POA: Diagnosis not present

## 2017-08-08 DIAGNOSIS — D631 Anemia in chronic kidney disease: Secondary | ICD-10-CM | POA: Diagnosis not present

## 2017-08-09 DIAGNOSIS — Z992 Dependence on renal dialysis: Secondary | ICD-10-CM | POA: Diagnosis not present

## 2017-08-09 DIAGNOSIS — N186 End stage renal disease: Secondary | ICD-10-CM | POA: Diagnosis not present

## 2017-08-10 DIAGNOSIS — Z992 Dependence on renal dialysis: Secondary | ICD-10-CM | POA: Diagnosis not present

## 2017-08-10 DIAGNOSIS — N186 End stage renal disease: Secondary | ICD-10-CM | POA: Diagnosis not present

## 2017-08-11 DIAGNOSIS — D509 Iron deficiency anemia, unspecified: Secondary | ICD-10-CM | POA: Diagnosis not present

## 2017-08-11 DIAGNOSIS — D631 Anemia in chronic kidney disease: Secondary | ICD-10-CM | POA: Diagnosis not present

## 2017-08-11 DIAGNOSIS — E559 Vitamin D deficiency, unspecified: Secondary | ICD-10-CM | POA: Diagnosis not present

## 2017-08-11 DIAGNOSIS — I120 Hypertensive chronic kidney disease with stage 5 chronic kidney disease or end stage renal disease: Secondary | ICD-10-CM | POA: Diagnosis not present

## 2017-08-11 DIAGNOSIS — M545 Low back pain: Secondary | ICD-10-CM | POA: Diagnosis not present

## 2017-08-11 DIAGNOSIS — E1122 Type 2 diabetes mellitus with diabetic chronic kidney disease: Secondary | ICD-10-CM | POA: Diagnosis not present

## 2017-08-11 DIAGNOSIS — N186 End stage renal disease: Secondary | ICD-10-CM | POA: Diagnosis not present

## 2017-08-11 DIAGNOSIS — Z992 Dependence on renal dialysis: Secondary | ICD-10-CM | POA: Diagnosis not present

## 2017-08-11 DIAGNOSIS — M6281 Muscle weakness (generalized): Secondary | ICD-10-CM | POA: Diagnosis not present

## 2017-08-12 DIAGNOSIS — D509 Iron deficiency anemia, unspecified: Secondary | ICD-10-CM | POA: Diagnosis not present

## 2017-08-12 DIAGNOSIS — Z992 Dependence on renal dialysis: Secondary | ICD-10-CM | POA: Diagnosis not present

## 2017-08-12 DIAGNOSIS — N186 End stage renal disease: Secondary | ICD-10-CM | POA: Diagnosis not present

## 2017-08-12 DIAGNOSIS — E559 Vitamin D deficiency, unspecified: Secondary | ICD-10-CM | POA: Diagnosis not present

## 2017-08-13 DIAGNOSIS — N186 End stage renal disease: Secondary | ICD-10-CM | POA: Diagnosis not present

## 2017-08-13 DIAGNOSIS — Z992 Dependence on renal dialysis: Secondary | ICD-10-CM | POA: Diagnosis not present

## 2017-08-13 DIAGNOSIS — E559 Vitamin D deficiency, unspecified: Secondary | ICD-10-CM | POA: Diagnosis not present

## 2017-08-13 DIAGNOSIS — D509 Iron deficiency anemia, unspecified: Secondary | ICD-10-CM | POA: Diagnosis not present

## 2017-08-14 DIAGNOSIS — Z992 Dependence on renal dialysis: Secondary | ICD-10-CM | POA: Diagnosis not present

## 2017-08-14 DIAGNOSIS — N186 End stage renal disease: Secondary | ICD-10-CM | POA: Diagnosis not present

## 2017-08-14 DIAGNOSIS — D509 Iron deficiency anemia, unspecified: Secondary | ICD-10-CM | POA: Diagnosis not present

## 2017-08-14 DIAGNOSIS — E559 Vitamin D deficiency, unspecified: Secondary | ICD-10-CM | POA: Diagnosis not present

## 2017-08-15 DIAGNOSIS — I120 Hypertensive chronic kidney disease with stage 5 chronic kidney disease or end stage renal disease: Secondary | ICD-10-CM | POA: Diagnosis not present

## 2017-08-15 DIAGNOSIS — M6281 Muscle weakness (generalized): Secondary | ICD-10-CM | POA: Diagnosis not present

## 2017-08-15 DIAGNOSIS — D509 Iron deficiency anemia, unspecified: Secondary | ICD-10-CM | POA: Diagnosis not present

## 2017-08-15 DIAGNOSIS — Z992 Dependence on renal dialysis: Secondary | ICD-10-CM | POA: Diagnosis not present

## 2017-08-15 DIAGNOSIS — M545 Low back pain: Secondary | ICD-10-CM | POA: Diagnosis not present

## 2017-08-15 DIAGNOSIS — D631 Anemia in chronic kidney disease: Secondary | ICD-10-CM | POA: Diagnosis not present

## 2017-08-15 DIAGNOSIS — E559 Vitamin D deficiency, unspecified: Secondary | ICD-10-CM | POA: Diagnosis not present

## 2017-08-15 DIAGNOSIS — E1122 Type 2 diabetes mellitus with diabetic chronic kidney disease: Secondary | ICD-10-CM | POA: Diagnosis not present

## 2017-08-15 DIAGNOSIS — N186 End stage renal disease: Secondary | ICD-10-CM | POA: Diagnosis not present

## 2017-08-16 DIAGNOSIS — N186 End stage renal disease: Secondary | ICD-10-CM | POA: Diagnosis not present

## 2017-08-16 DIAGNOSIS — D509 Iron deficiency anemia, unspecified: Secondary | ICD-10-CM | POA: Diagnosis not present

## 2017-08-16 DIAGNOSIS — E559 Vitamin D deficiency, unspecified: Secondary | ICD-10-CM | POA: Diagnosis not present

## 2017-08-16 DIAGNOSIS — Z992 Dependence on renal dialysis: Secondary | ICD-10-CM | POA: Diagnosis not present

## 2017-08-17 DIAGNOSIS — D509 Iron deficiency anemia, unspecified: Secondary | ICD-10-CM | POA: Diagnosis not present

## 2017-08-17 DIAGNOSIS — N186 End stage renal disease: Secondary | ICD-10-CM | POA: Diagnosis not present

## 2017-08-17 DIAGNOSIS — Z992 Dependence on renal dialysis: Secondary | ICD-10-CM | POA: Diagnosis not present

## 2017-08-17 DIAGNOSIS — E559 Vitamin D deficiency, unspecified: Secondary | ICD-10-CM | POA: Diagnosis not present

## 2017-08-18 DIAGNOSIS — Z992 Dependence on renal dialysis: Secondary | ICD-10-CM | POA: Diagnosis not present

## 2017-08-18 DIAGNOSIS — M545 Low back pain: Secondary | ICD-10-CM | POA: Diagnosis not present

## 2017-08-18 DIAGNOSIS — M6281 Muscle weakness (generalized): Secondary | ICD-10-CM | POA: Diagnosis not present

## 2017-08-18 DIAGNOSIS — E1122 Type 2 diabetes mellitus with diabetic chronic kidney disease: Secondary | ICD-10-CM | POA: Diagnosis not present

## 2017-08-18 DIAGNOSIS — E559 Vitamin D deficiency, unspecified: Secondary | ICD-10-CM | POA: Diagnosis not present

## 2017-08-18 DIAGNOSIS — I120 Hypertensive chronic kidney disease with stage 5 chronic kidney disease or end stage renal disease: Secondary | ICD-10-CM | POA: Diagnosis not present

## 2017-08-18 DIAGNOSIS — D509 Iron deficiency anemia, unspecified: Secondary | ICD-10-CM | POA: Diagnosis not present

## 2017-08-18 DIAGNOSIS — N186 End stage renal disease: Secondary | ICD-10-CM | POA: Diagnosis not present

## 2017-08-18 DIAGNOSIS — D631 Anemia in chronic kidney disease: Secondary | ICD-10-CM | POA: Diagnosis not present

## 2017-08-19 DIAGNOSIS — Z992 Dependence on renal dialysis: Secondary | ICD-10-CM | POA: Diagnosis not present

## 2017-08-19 DIAGNOSIS — E559 Vitamin D deficiency, unspecified: Secondary | ICD-10-CM | POA: Diagnosis not present

## 2017-08-19 DIAGNOSIS — D509 Iron deficiency anemia, unspecified: Secondary | ICD-10-CM | POA: Diagnosis not present

## 2017-08-19 DIAGNOSIS — N186 End stage renal disease: Secondary | ICD-10-CM | POA: Diagnosis not present

## 2017-08-20 DIAGNOSIS — N186 End stage renal disease: Secondary | ICD-10-CM | POA: Diagnosis not present

## 2017-08-20 DIAGNOSIS — Z992 Dependence on renal dialysis: Secondary | ICD-10-CM | POA: Diagnosis not present

## 2017-08-20 DIAGNOSIS — E559 Vitamin D deficiency, unspecified: Secondary | ICD-10-CM | POA: Diagnosis not present

## 2017-08-20 DIAGNOSIS — D509 Iron deficiency anemia, unspecified: Secondary | ICD-10-CM | POA: Diagnosis not present

## 2017-08-21 DIAGNOSIS — N186 End stage renal disease: Secondary | ICD-10-CM | POA: Diagnosis not present

## 2017-08-21 DIAGNOSIS — D509 Iron deficiency anemia, unspecified: Secondary | ICD-10-CM | POA: Diagnosis not present

## 2017-08-21 DIAGNOSIS — Z992 Dependence on renal dialysis: Secondary | ICD-10-CM | POA: Diagnosis not present

## 2017-08-21 DIAGNOSIS — E559 Vitamin D deficiency, unspecified: Secondary | ICD-10-CM | POA: Diagnosis not present

## 2017-08-22 DIAGNOSIS — Z992 Dependence on renal dialysis: Secondary | ICD-10-CM | POA: Diagnosis not present

## 2017-08-22 DIAGNOSIS — N186 End stage renal disease: Secondary | ICD-10-CM | POA: Diagnosis not present

## 2017-08-22 DIAGNOSIS — E559 Vitamin D deficiency, unspecified: Secondary | ICD-10-CM | POA: Diagnosis not present

## 2017-08-22 DIAGNOSIS — D509 Iron deficiency anemia, unspecified: Secondary | ICD-10-CM | POA: Diagnosis not present

## 2017-08-23 DIAGNOSIS — D509 Iron deficiency anemia, unspecified: Secondary | ICD-10-CM | POA: Diagnosis not present

## 2017-08-23 DIAGNOSIS — Z992 Dependence on renal dialysis: Secondary | ICD-10-CM | POA: Diagnosis not present

## 2017-08-23 DIAGNOSIS — N186 End stage renal disease: Secondary | ICD-10-CM | POA: Diagnosis not present

## 2017-08-23 DIAGNOSIS — E559 Vitamin D deficiency, unspecified: Secondary | ICD-10-CM | POA: Diagnosis not present

## 2017-08-24 DIAGNOSIS — N186 End stage renal disease: Secondary | ICD-10-CM | POA: Diagnosis not present

## 2017-08-24 DIAGNOSIS — D509 Iron deficiency anemia, unspecified: Secondary | ICD-10-CM | POA: Diagnosis not present

## 2017-08-24 DIAGNOSIS — Z992 Dependence on renal dialysis: Secondary | ICD-10-CM | POA: Diagnosis not present

## 2017-08-24 DIAGNOSIS — E559 Vitamin D deficiency, unspecified: Secondary | ICD-10-CM | POA: Diagnosis not present

## 2017-08-25 DIAGNOSIS — N186 End stage renal disease: Secondary | ICD-10-CM | POA: Diagnosis not present

## 2017-08-25 DIAGNOSIS — Z992 Dependence on renal dialysis: Secondary | ICD-10-CM | POA: Diagnosis not present

## 2017-08-25 DIAGNOSIS — D509 Iron deficiency anemia, unspecified: Secondary | ICD-10-CM | POA: Diagnosis not present

## 2017-08-25 DIAGNOSIS — E559 Vitamin D deficiency, unspecified: Secondary | ICD-10-CM | POA: Diagnosis not present

## 2017-08-26 DIAGNOSIS — N186 End stage renal disease: Secondary | ICD-10-CM | POA: Diagnosis not present

## 2017-08-26 DIAGNOSIS — Z992 Dependence on renal dialysis: Secondary | ICD-10-CM | POA: Diagnosis not present

## 2017-08-26 DIAGNOSIS — D509 Iron deficiency anemia, unspecified: Secondary | ICD-10-CM | POA: Diagnosis not present

## 2017-08-26 DIAGNOSIS — E559 Vitamin D deficiency, unspecified: Secondary | ICD-10-CM | POA: Diagnosis not present

## 2017-08-27 DIAGNOSIS — E559 Vitamin D deficiency, unspecified: Secondary | ICD-10-CM | POA: Diagnosis not present

## 2017-08-27 DIAGNOSIS — D509 Iron deficiency anemia, unspecified: Secondary | ICD-10-CM | POA: Diagnosis not present

## 2017-08-27 DIAGNOSIS — Z992 Dependence on renal dialysis: Secondary | ICD-10-CM | POA: Diagnosis not present

## 2017-08-27 DIAGNOSIS — N186 End stage renal disease: Secondary | ICD-10-CM | POA: Diagnosis not present

## 2017-08-28 DIAGNOSIS — Z992 Dependence on renal dialysis: Secondary | ICD-10-CM | POA: Diagnosis not present

## 2017-08-28 DIAGNOSIS — D509 Iron deficiency anemia, unspecified: Secondary | ICD-10-CM | POA: Diagnosis not present

## 2017-08-28 DIAGNOSIS — E559 Vitamin D deficiency, unspecified: Secondary | ICD-10-CM | POA: Diagnosis not present

## 2017-08-28 DIAGNOSIS — N186 End stage renal disease: Secondary | ICD-10-CM | POA: Diagnosis not present

## 2017-08-29 DIAGNOSIS — D509 Iron deficiency anemia, unspecified: Secondary | ICD-10-CM | POA: Diagnosis not present

## 2017-08-29 DIAGNOSIS — N186 End stage renal disease: Secondary | ICD-10-CM | POA: Diagnosis not present

## 2017-08-29 DIAGNOSIS — Z992 Dependence on renal dialysis: Secondary | ICD-10-CM | POA: Diagnosis not present

## 2017-08-29 DIAGNOSIS — E559 Vitamin D deficiency, unspecified: Secondary | ICD-10-CM | POA: Diagnosis not present

## 2017-08-30 DIAGNOSIS — N186 End stage renal disease: Secondary | ICD-10-CM | POA: Diagnosis not present

## 2017-08-30 DIAGNOSIS — Z992 Dependence on renal dialysis: Secondary | ICD-10-CM | POA: Diagnosis not present

## 2017-08-30 DIAGNOSIS — E559 Vitamin D deficiency, unspecified: Secondary | ICD-10-CM | POA: Diagnosis not present

## 2017-08-30 DIAGNOSIS — D509 Iron deficiency anemia, unspecified: Secondary | ICD-10-CM | POA: Diagnosis not present

## 2017-08-31 DIAGNOSIS — N186 End stage renal disease: Secondary | ICD-10-CM | POA: Diagnosis not present

## 2017-08-31 DIAGNOSIS — Z992 Dependence on renal dialysis: Secondary | ICD-10-CM | POA: Diagnosis not present

## 2017-08-31 DIAGNOSIS — D509 Iron deficiency anemia, unspecified: Secondary | ICD-10-CM | POA: Diagnosis not present

## 2017-08-31 DIAGNOSIS — E559 Vitamin D deficiency, unspecified: Secondary | ICD-10-CM | POA: Diagnosis not present

## 2017-09-01 DIAGNOSIS — Z992 Dependence on renal dialysis: Secondary | ICD-10-CM | POA: Diagnosis not present

## 2017-09-01 DIAGNOSIS — N186 End stage renal disease: Secondary | ICD-10-CM | POA: Diagnosis not present

## 2017-09-01 DIAGNOSIS — E559 Vitamin D deficiency, unspecified: Secondary | ICD-10-CM | POA: Diagnosis not present

## 2017-09-01 DIAGNOSIS — D509 Iron deficiency anemia, unspecified: Secondary | ICD-10-CM | POA: Diagnosis not present

## 2017-09-02 DIAGNOSIS — N186 End stage renal disease: Secondary | ICD-10-CM | POA: Diagnosis not present

## 2017-09-02 DIAGNOSIS — E559 Vitamin D deficiency, unspecified: Secondary | ICD-10-CM | POA: Diagnosis not present

## 2017-09-02 DIAGNOSIS — D509 Iron deficiency anemia, unspecified: Secondary | ICD-10-CM | POA: Diagnosis not present

## 2017-09-02 DIAGNOSIS — Z992 Dependence on renal dialysis: Secondary | ICD-10-CM | POA: Diagnosis not present

## 2017-09-03 DIAGNOSIS — Z992 Dependence on renal dialysis: Secondary | ICD-10-CM | POA: Diagnosis not present

## 2017-09-03 DIAGNOSIS — D509 Iron deficiency anemia, unspecified: Secondary | ICD-10-CM | POA: Diagnosis not present

## 2017-09-03 DIAGNOSIS — E559 Vitamin D deficiency, unspecified: Secondary | ICD-10-CM | POA: Diagnosis not present

## 2017-09-03 DIAGNOSIS — N186 End stage renal disease: Secondary | ICD-10-CM | POA: Diagnosis not present

## 2017-09-04 DIAGNOSIS — D509 Iron deficiency anemia, unspecified: Secondary | ICD-10-CM | POA: Diagnosis not present

## 2017-09-04 DIAGNOSIS — N186 End stage renal disease: Secondary | ICD-10-CM | POA: Diagnosis not present

## 2017-09-04 DIAGNOSIS — E559 Vitamin D deficiency, unspecified: Secondary | ICD-10-CM | POA: Diagnosis not present

## 2017-09-04 DIAGNOSIS — Z992 Dependence on renal dialysis: Secondary | ICD-10-CM | POA: Diagnosis not present

## 2017-09-05 DIAGNOSIS — E559 Vitamin D deficiency, unspecified: Secondary | ICD-10-CM | POA: Diagnosis not present

## 2017-09-05 DIAGNOSIS — Z992 Dependence on renal dialysis: Secondary | ICD-10-CM | POA: Diagnosis not present

## 2017-09-05 DIAGNOSIS — D509 Iron deficiency anemia, unspecified: Secondary | ICD-10-CM | POA: Diagnosis not present

## 2017-09-05 DIAGNOSIS — N186 End stage renal disease: Secondary | ICD-10-CM | POA: Diagnosis not present

## 2017-09-06 DIAGNOSIS — E559 Vitamin D deficiency, unspecified: Secondary | ICD-10-CM | POA: Diagnosis not present

## 2017-09-06 DIAGNOSIS — Z992 Dependence on renal dialysis: Secondary | ICD-10-CM | POA: Diagnosis not present

## 2017-09-06 DIAGNOSIS — N186 End stage renal disease: Secondary | ICD-10-CM | POA: Diagnosis not present

## 2017-09-06 DIAGNOSIS — D509 Iron deficiency anemia, unspecified: Secondary | ICD-10-CM | POA: Diagnosis not present

## 2017-09-07 ENCOUNTER — Other Ambulatory Visit: Payer: Self-pay

## 2017-09-07 ENCOUNTER — Encounter: Payer: Self-pay | Admitting: Emergency Medicine

## 2017-09-07 ENCOUNTER — Inpatient Hospital Stay
Admission: EM | Admit: 2017-09-07 | Discharge: 2017-09-10 | DRG: 314 | Disposition: A | Discharge status: Home / Self Care | Payer: Medicare Other | Attending: Internal Medicine | Admitting: Internal Medicine

## 2017-09-07 DIAGNOSIS — K219 Gastro-esophageal reflux disease without esophagitis: Secondary | ICD-10-CM | POA: Diagnosis present

## 2017-09-07 DIAGNOSIS — I132 Hypertensive heart and chronic kidney disease with heart failure and with stage 5 chronic kidney disease, or end stage renal disease: Secondary | ICD-10-CM | POA: Diagnosis not present

## 2017-09-07 DIAGNOSIS — Z833 Family history of diabetes mellitus: Secondary | ICD-10-CM

## 2017-09-07 DIAGNOSIS — E11319 Type 2 diabetes mellitus with unspecified diabetic retinopathy without macular edema: Secondary | ICD-10-CM | POA: Diagnosis present

## 2017-09-07 DIAGNOSIS — N186 End stage renal disease: Secondary | ICD-10-CM | POA: Diagnosis not present

## 2017-09-07 DIAGNOSIS — J449 Chronic obstructive pulmonary disease, unspecified: Secondary | ICD-10-CM | POA: Diagnosis present

## 2017-09-07 DIAGNOSIS — E119 Type 2 diabetes mellitus without complications: Secondary | ICD-10-CM | POA: Diagnosis not present

## 2017-09-07 DIAGNOSIS — D638 Anemia in other chronic diseases classified elsewhere: Secondary | ICD-10-CM | POA: Diagnosis present

## 2017-09-07 DIAGNOSIS — Z9115 Patient's noncompliance with renal dialysis: Secondary | ICD-10-CM

## 2017-09-07 DIAGNOSIS — A419 Sepsis, unspecified organism: Secondary | ICD-10-CM | POA: Diagnosis not present

## 2017-09-07 DIAGNOSIS — Z87891 Personal history of nicotine dependence: Secondary | ICD-10-CM

## 2017-09-07 DIAGNOSIS — Z8249 Family history of ischemic heart disease and other diseases of the circulatory system: Secondary | ICD-10-CM

## 2017-09-07 DIAGNOSIS — D509 Iron deficiency anemia, unspecified: Secondary | ICD-10-CM | POA: Diagnosis not present

## 2017-09-07 DIAGNOSIS — E861 Hypovolemia: Secondary | ICD-10-CM | POA: Diagnosis not present

## 2017-09-07 DIAGNOSIS — Z992 Dependence on renal dialysis: Secondary | ICD-10-CM

## 2017-09-07 DIAGNOSIS — Z7984 Long term (current) use of oral hypoglycemic drugs: Secondary | ICD-10-CM

## 2017-09-07 DIAGNOSIS — R531 Weakness: Secondary | ICD-10-CM

## 2017-09-07 DIAGNOSIS — I959 Hypotension, unspecified: Secondary | ICD-10-CM | POA: Diagnosis present

## 2017-09-07 DIAGNOSIS — E1122 Type 2 diabetes mellitus with diabetic chronic kidney disease: Secondary | ICD-10-CM | POA: Diagnosis present

## 2017-09-07 DIAGNOSIS — Z1159 Encounter for screening for other viral diseases: Secondary | ICD-10-CM | POA: Diagnosis not present

## 2017-09-07 DIAGNOSIS — Z79899 Other long term (current) drug therapy: Secondary | ICD-10-CM | POA: Diagnosis not present

## 2017-09-07 DIAGNOSIS — E785 Hyperlipidemia, unspecified: Secondary | ICD-10-CM | POA: Diagnosis present

## 2017-09-07 DIAGNOSIS — F329 Major depressive disorder, single episode, unspecified: Secondary | ICD-10-CM | POA: Diagnosis present

## 2017-09-07 DIAGNOSIS — E559 Vitamin D deficiency, unspecified: Secondary | ICD-10-CM | POA: Diagnosis not present

## 2017-09-07 DIAGNOSIS — E1140 Type 2 diabetes mellitus with diabetic neuropathy, unspecified: Secondary | ICD-10-CM | POA: Diagnosis present

## 2017-09-07 DIAGNOSIS — N2581 Secondary hyperparathyroidism of renal origin: Secondary | ICD-10-CM | POA: Diagnosis present

## 2017-09-07 DIAGNOSIS — I5032 Chronic diastolic (congestive) heart failure: Secondary | ICD-10-CM | POA: Diagnosis present

## 2017-09-07 DIAGNOSIS — Z6833 Body mass index (BMI) 33.0-33.9, adult: Secondary | ICD-10-CM

## 2017-09-07 DIAGNOSIS — I120 Hypertensive chronic kidney disease with stage 5 chronic kidney disease or end stage renal disease: Secondary | ICD-10-CM | POA: Diagnosis not present

## 2017-09-07 DIAGNOSIS — E669 Obesity, unspecified: Secondary | ICD-10-CM | POA: Diagnosis present

## 2017-09-07 DIAGNOSIS — E1151 Type 2 diabetes mellitus with diabetic peripheral angiopathy without gangrene: Secondary | ICD-10-CM | POA: Diagnosis present

## 2017-09-07 DIAGNOSIS — Z7982 Long term (current) use of aspirin: Secondary | ICD-10-CM

## 2017-09-07 DIAGNOSIS — D631 Anemia in chronic kidney disease: Secondary | ICD-10-CM | POA: Diagnosis not present

## 2017-09-07 LAB — BASIC METABOLIC PANEL
Anion gap: 13 (ref 5–15)
BUN: 39 mg/dL — ABNORMAL HIGH (ref 6–20)
CALCIUM: 6.9 mg/dL — AB (ref 8.9–10.3)
CO2: 28 mmol/L (ref 22–32)
CREATININE: 9.82 mg/dL — AB (ref 0.44–1.00)
Chloride: 95 mmol/L — ABNORMAL LOW (ref 98–111)
GFR calc non Af Amer: 4 mL/min — ABNORMAL LOW (ref >60)
GFR, EST AFRICAN AMERICAN: 4 mL/min — AB (ref >60)
Glucose, Bld: 192 mg/dL — ABNORMAL HIGH (ref 70–99)
Potassium: 3.4 mmol/L — ABNORMAL LOW (ref 3.5–5.1)
SODIUM: 136 mmol/L (ref 135–145)

## 2017-09-07 LAB — CBC WITH DIFFERENTIAL/PLATELET
BASOS ABS: 0 10*3/uL (ref 0–0.1)
BASOS PCT: 0 %
EOS ABS: 0.2 10*3/uL (ref 0–0.7)
Eosinophils Relative: 2 %
HCT: 26.9 % — ABNORMAL LOW (ref 35.0–47.0)
HEMOGLOBIN: 8.9 g/dL — AB (ref 12.0–16.0)
Lymphocytes Relative: 10 %
Lymphs Abs: 1.3 10*3/uL (ref 1.0–3.6)
MCH: 28.9 pg (ref 26.0–34.0)
MCHC: 33 g/dL (ref 32.0–36.0)
MCV: 87.6 fL (ref 80.0–100.0)
MONOS PCT: 5 %
Monocytes Absolute: 0.6 10*3/uL (ref 0.2–0.9)
NEUTROS PCT: 83 %
Neutro Abs: 11.1 10*3/uL — ABNORMAL HIGH (ref 1.4–6.5)
Platelets: 280 10*3/uL (ref 150–440)
RBC: 3.07 MIL/uL — ABNORMAL LOW (ref 3.80–5.20)
RDW: 13.9 % (ref 11.5–14.5)
WBC: 13.4 10*3/uL — ABNORMAL HIGH (ref 3.6–11.0)

## 2017-09-07 LAB — HEPATIC FUNCTION PANEL
ALT: 10 U/L (ref 0–44)
AST: 17 U/L (ref 15–41)
Albumin: 2.2 g/dL — ABNORMAL LOW (ref 3.5–5.0)
Alkaline Phosphatase: 59 U/L (ref 38–126)
Total Bilirubin: 0.6 mg/dL (ref 0.3–1.2)
Total Protein: 5.4 g/dL — ABNORMAL LOW (ref 6.5–8.1)

## 2017-09-07 LAB — BODY FLUID CELL COUNT WITH DIFFERENTIAL
EOS FL: 6 %
Lymphs, Fluid: 4 %
Monocyte-Macrophage-Serous Fluid: 50 %
Neutrophil Count, Fluid: 38 %
Other Cells, Fluid: 2 %
Total Nucleated Cell Count, Fluid: 130 cu mm

## 2017-09-07 LAB — LACTIC ACID, PLASMA
LACTIC ACID, VENOUS: 1.6 mmol/L (ref 0.5–1.9)
LACTIC ACID, VENOUS: 1.5 mmol/L (ref 0.5–1.9)

## 2017-09-07 LAB — MRSA PCR SCREENING: MRSA by PCR: NEGATIVE

## 2017-09-07 LAB — GLUCOSE, CAPILLARY: GLUCOSE-CAPILLARY: 166 mg/dL — AB (ref 70–99)

## 2017-09-07 MED ORDER — LACTULOSE 10 GM/15ML PO SOLN
20.0000 g | ORAL | Status: DC
  Administered 2017-09-08: 20 g via ORAL
  Filled 2017-09-07: qty 30

## 2017-09-07 MED ORDER — ACETAMINOPHEN 325 MG PO TABS
650.0000 mg | ORAL_TABLET | Freq: Four times a day (QID) | ORAL | Status: DC | PRN
  Administered 2017-09-10: 650 mg via ORAL
  Filled 2017-09-07: qty 2

## 2017-09-07 MED ORDER — ONDANSETRON HCL 4 MG PO TABS
4.0000 mg | ORAL_TABLET | Freq: Four times a day (QID) | ORAL | Status: DC | PRN
  Administered 2017-09-08 – 2017-09-10 (×2): 4 mg via ORAL
  Filled 2017-09-07 (×2): qty 1

## 2017-09-07 MED ORDER — SEVELAMER CARBONATE 800 MG PO TABS
1600.0000 mg | ORAL_TABLET | Freq: Three times a day (TID) | ORAL | Status: DC
  Administered 2017-09-07 – 2017-09-09 (×6): 1600 mg via ORAL
  Filled 2017-09-07 (×7): qty 2

## 2017-09-07 MED ORDER — VANCOMYCIN HCL 10 G IV SOLR
2000.0000 mg | Freq: Once | INTRAVENOUS | Status: AC
  Administered 2017-09-07: 2000 mg via INTRAVENOUS
  Filled 2017-09-07: qty 2000

## 2017-09-07 MED ORDER — SERTRALINE HCL 50 MG PO TABS
100.0000 mg | ORAL_TABLET | Freq: Every day | ORAL | Status: DC
  Filled 2017-09-07 (×2): qty 2

## 2017-09-07 MED ORDER — SODIUM CHLORIDE 0.9 % IV SOLN: 2.0000 g | INTRAVENOUS | Status: DC

## 2017-09-07 MED ORDER — VANCOMYCIN VARIABLE DOSE PER UNSTABLE RENAL FUNCTION (PHARMACIST DOSING): Status: DC

## 2017-09-07 MED ORDER — HEPARIN SODIUM (PORCINE) 5000 UNIT/ML IJ SOLN
5000.0000 [IU] | Freq: Three times a day (TID) | INTRAMUSCULAR | Status: DC
  Administered 2017-09-07 – 2017-09-10 (×6): 5000 [IU] via SUBCUTANEOUS
  Filled 2017-09-07 (×7): qty 1

## 2017-09-07 MED ORDER — ACETAMINOPHEN 650 MG RE SUPP: 650.0000 mg | Freq: Four times a day (QID) | RECTAL | Status: DC | PRN

## 2017-09-07 MED ORDER — INSULIN ASPART 100 UNIT/ML ~~LOC~~ SOLN: 0.0000 [IU] | Freq: Every day | SUBCUTANEOUS | Status: DC

## 2017-09-07 MED ORDER — LORATADINE 10 MG PO TABS: 10.0000 mg | ORAL_TABLET | Freq: Every day | ORAL | Status: DC | PRN

## 2017-09-07 MED ORDER — SODIUM CHLORIDE 0.9 % IV SOLN
Freq: Once | INTRAVENOUS | Status: AC
  Administered 2017-09-07: 200 mL via INTRAVENOUS

## 2017-09-07 MED ORDER — MECLIZINE HCL 25 MG PO TABS
25.0000 mg | ORAL_TABLET | Freq: Three times a day (TID) | ORAL | Status: DC | PRN
  Filled 2017-09-07: qty 1

## 2017-09-07 MED ORDER — GABAPENTIN 100 MG PO CAPS
100.0000 mg | ORAL_CAPSULE | Freq: Three times a day (TID) | ORAL | Status: DC | PRN
  Administered 2017-09-08: 100 mg via ORAL
  Filled 2017-09-07: qty 1

## 2017-09-07 MED ORDER — CALCIUM GLUCONATE 10 % IV SOLN
1.0000 g | Freq: Once | INTRAVENOUS | Status: AC
  Administered 2017-09-07: 1 g via INTRAVENOUS
  Filled 2017-09-07: qty 10

## 2017-09-07 MED ORDER — LIDOCAINE-PRILOCAINE 2.5-2.5 % EX CREA
TOPICAL_CREAM | Freq: Once | CUTANEOUS | Status: AC
  Administered 2017-09-09: 11:00:00 via TOPICAL
  Filled 2017-09-07 (×2): qty 5

## 2017-09-07 MED ORDER — CALCIUM ACETATE (PHOS BINDER) 667 MG PO CAPS
2001.0000 mg | ORAL_CAPSULE | Freq: Three times a day (TID) | ORAL | Status: DC
  Administered 2017-09-07 – 2017-09-09 (×6): 2001 mg via ORAL
  Filled 2017-09-07 (×7): qty 3

## 2017-09-07 MED ORDER — ONDANSETRON HCL 4 MG/2ML IJ SOLN
4.0000 mg | Freq: Four times a day (QID) | INTRAMUSCULAR | Status: DC | PRN
  Administered 2017-09-10: 4 mg via INTRAVENOUS
  Filled 2017-09-07: qty 2

## 2017-09-07 MED ORDER — ALBUTEROL SULFATE (2.5 MG/3ML) 0.083% IN NEBU: 2.5000 mg | INHALATION_SOLUTION | Freq: Four times a day (QID) | RESPIRATORY_TRACT | Status: DC | PRN

## 2017-09-07 MED ORDER — INSULIN ASPART 100 UNIT/ML ~~LOC~~ SOLN
0.0000 [IU] | Freq: Three times a day (TID) | SUBCUTANEOUS | Status: DC
  Administered 2017-09-10 (×2): 1 [IU] via SUBCUTANEOUS
  Filled 2017-09-07 (×2): qty 1

## 2017-09-07 MED ORDER — ASPIRIN EC 81 MG PO TBEC
162.0000 mg | DELAYED_RELEASE_TABLET | Freq: Every day | ORAL | Status: DC
  Administered 2017-09-07 – 2017-09-09 (×3): 162 mg via ORAL
  Filled 2017-09-07 (×3): qty 2

## 2017-09-07 MED ORDER — GLIPIZIDE 5 MG PO TABS
5.0000 mg | ORAL_TABLET | Freq: Every day | ORAL | Status: DC
  Administered 2017-09-08 – 2017-09-09 (×2): 5 mg via ORAL
  Filled 2017-09-07 (×3): qty 1

## 2017-09-07 NOTE — ED Provider Notes (Signed)
[facility] Emergency Department Provider Note   ____________________________________________   I have reviewed the triage vital signs and the nursing notes.   HISTORY  Chief Complaint Hypotension   History limited by: Not Limited   HPI Jacqueline Rodriguez is a 57 y.o. female who presents to the emergency department today because of concerns for low blood pressure.  Patient was at the dialysis center getting a monthly checkup.  She does peritoneal dialysis at home.  She states for the past few days she has felt increasingly weak.  Patient was found to have low blood pressure.  She denies any significant chest pain abdominal pain.  She has had some nausea.  No shortness of breath.  She has not noticed any change to the effluent of her peritoneal dialysis.   Per medical record review patient has a history of COPD, ESRD on dialysis.   Past Medical History:  Diagnosis Date  . Anemia   . Chronic bronchitis ([place])   . Chronic diastolic CHF (congestive heart failure) ([place])   . CKD (chronic kidney disease), stage III ([place])   . COPD (chronic obstructive pulmonary disease) ([place])   . Diabetes mellitus with renal complications ([place])   . Essential hypertension   . GERD (gastroesophageal reflux disease)   . Obesity   . Peripheral vascular disease ([place])   . Pulmonary hypertension ([place])   . Renal insufficiency 09/19/2015   Stage 3 CKD. Beginning dialysis.    Patient Active Problem List   Diagnosis Date Noted  . Peritoneal dialysis catheter exit site infection ([place]) 10/05/2016  . 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 Hypocalcemia 09/14/2016  . Hyperkalemia 09/13/2016  . Syncope 09/08/2016  . ESRD on dialysis ([place]) 05/25/2016  . Chronic diastolic heart failure ([place]) 07/30/2015  . Acute renal failure superimposed on stage 3 chronic kidney disease ([place]) 07/18/2015  . Diabetes mellitus with renal complications ([place])   . CKD (chronic kidney disease), stage III  ([place])   . Pulmonary hypertension ([place])   . Obesity   . Morbid obesity due to excess calories ([place])   . Anemia in chronic renal disease   . Shortness of breath   . Back pain 05/01/2015  . Essential hypertension 03/25/2015  . Diabetes ([place]) 03/25/2015    Past Surgical History:  Procedure Laterality Date  . A/V FISTULAGRAM N/A 09/16/2016   Procedure: A/V Fistulagram;  Surgeon: Algernon Huxley, MD;  Location: [facility];  Service: Cardiovascular;  Laterality: N/A;  . AV FISTULA PLACEMENT Left 10/01/2015   Procedure: ARTERIOVENOUS (AV) FISTULA CREATION ( BRACHIAL CEPHALIC );  Surgeon: Algernon Huxley, MD;  Location: [facility];  Service: Vascular;  Laterality: Left;  . CAPD INSERTION N/A 06/10/2016   Procedure: LAPAROSCOPIC INSERTION CONTINUOUS AMBULATORY PERITONEAL DIALYSIS  (CAPD) CATHETER;  Surgeon: Algernon Huxley, MD;  Location: [facility];  Service: Vascular;  Laterality: N/A;  . DIALYSIS/PERMA CATHETER INSERTION  09/16/2016   Procedure: DIALYSIS/PERMA CATHETER INSERTION;  Surgeon: Algernon Huxley, MD;  Location: [facility];  Service: Cardiovascular;;  . DIALYSIS/PERMA CATHETER REMOVAL N/A 12/06/2016   Procedure: DIALYSIS/PERMA CATHETER REMOVAL;  Surgeon: Algernon Huxley, MD;  Location: [facility];  Service: Cardiovascular;  Laterality: N/A;  . PERIPHERAL VASCULAR CATHETERIZATION N/A 07/21/2015   Procedure: Dialysis/Perma Catheter;  Surgeon: Algernon Huxley, MD;  Location: [facility];  Service: Cardiovascular;  Laterality: N/A;  . PERIPHERAL VASCULAR CATHETERIZATION N/A 12/11/2015   Procedure: Dialysis/Perma Catheter Removal;  Surgeon: Erskine Squibb  Lucky Cowboy, MD;  Location: [facility];  Service: Cardiovascular;  Laterality: N/A;    Prior to Admission medications   Medication Sig Start Date End Date Taking? Authorizing Provider  acetaminophen (TYLENOL) 500 MG tablet Take 500 mg by mouth every 6 (six) hours as needed.    [provider]  albuterol (PROVENTIL  HFA;VENTOLIN HFA) 108 (90 Base) MCG/ACT inhaler Inhale 2 puffs into the lungs every 6 (six) hours as needed for wheezing or shortness of breath. 01/08/15   Gregor Hams, MD  aspirin EC 81 MG EC tablet Take 1 tablet (81 mg total) by mouth daily. Patient taking differently: Take 162 mg by mouth at bedtime.  03/20/15   Vaughan Basta, MD  azithromycin (ZITHROMAX) 250 MG tablet One tablet daily as per DR Ola Spurr infectious disease Patient not taking: Reported on 12/06/2016 09/17/16   Loletha Grayer, MD  calcium acetate (PHOSLO) 667 MG capsule Take 3 capsules (2,001 mg total) by mouth 3 (three) times daily with meals. Patient not taking: Reported on 12/16/2016 09/17/16   Loletha Grayer, MD  Calcium-Magnesium-Vitamin D (CALCIUM 1200+D3 PO) Take 1 tablet by mouth daily.    [provider]  cetirizine (ZYRTEC) 10 MG tablet Take 10 mg by mouth daily as needed for allergies.    [provider]  ferrous sulfate 325 (65 FE) MG EC tablet Take 1 tablet (325 mg total) by mouth 2 (two) times daily. 09/17/16 09/17/17  Loletha Grayer, MD  furosemide (LASIX) 40 MG tablet Take 1 tablet (40 mg total) by mouth daily. 09/17/16   Loletha Grayer, MD  gabapentin (NEURONTIN) 100 MG capsule Take 100 mg by mouth 3 (three) times daily as needed. For neuropathy. 03/05/15   [provider]  glipiZIDE (GLUCOTROL) 5 MG tablet Take 5 mg by mouth daily.    [provider]  GLUCOSAMINE-CHONDROITIN PO Take 4,000 mg by mouth daily.    [provider]  hydrALAZINE (APRESOLINE) 10 MG tablet Take 1 tablet (10 mg total) by mouth 3 (three) times daily. 12/04/16   Vaughan Basta, MD  HYDROcodone-acetaminophen (NORCO) 5-325 MG tablet Take 1 tablet by mouth every 6 (six) hours as needed for moderate pain. 06/10/16   Algernon Huxley, MD  isosorbide mononitrate (IMDUR) 30 MG 24 hr tablet Take 30 mg by mouth daily.    [provider]  magnesium oxide (MAG-OX) 400 (241.3 Mg) MG  tablet Take 1 tablet (400 mg total) by mouth daily. 09/17/16   Loletha Grayer, MD  meclizine (ANTIVERT) 25 MG tablet Take 1 tablet (25 mg total) by mouth 3 (three) times daily as needed for dizziness. 12/08/15   Nance Pear, MD  ondansetron (ZOFRAN) 4 MG tablet Take 1 tablet (4 mg total) by mouth every 6 (six) hours as needed for nausea. 09/17/16   Loletha Grayer, MD  sevelamer carbonate (RENVELA) 800 MG tablet Take 1,600 mg by mouth 3 (three) times daily with meals.    [provider]    Allergies Ambien [zolpidem]  Family History  Problem Relation Age of Onset  . Hypertension Mother   . Diabetes Mellitus II Mother   . CAD Father   . Hypertension Father   . Heart attack Father     Social History Social History   Tobacco Use  . Smoking status: Former Smoker    Types: Cigarettes    Last attempt to quit: 03/24/1985    Years since quitting: 32.4  . Smokeless tobacco: Never Used  Substance Use Topics  . Alcohol  use: No    Alcohol/week: 0.0 standard drinks  . Drug use: No    Review of Systems Constitutional: No fever/chills Eyes: No visual changes. ENT: No sore throat. Cardiovascular: Denies chest pain. Respiratory: Denies shortness of breath. Gastrointestinal: No abdominal pain.  No nausea, no vomiting.  No diarrhea.   Genitourinary: Negative for dysuria. Musculoskeletal: Negative for back pain. Skin: Negative for rash. Neurological: Negative for headaches, focal weakness or numbness.  ____________________________________________   PHYSICAL EXAM:  VITAL SIGNS: ED Triage Vitals  Enc Vitals Group     BP 09/07/17 1224 (!) 73/48     Pulse --      Resp 09/07/17 1224 18     Temp 09/07/17 1224 97.8 F (36.6 C)     Temp Source 09/07/17 1224 Oral     SpO2 09/07/17 1224 97 %     Weight 09/07/17 1216 211 lb (95.7 kg)     Height 09/07/17 1216 5\' 7"  (1.702 m)     Head Circumference --      Peak Flow --      Pain Score 09/07/17 1216 0   Constitutional:  Alert and oriented.  Eyes: Conjunctivae are normal.  ENT      Head: Normocephalic and atraumatic.      Nose: No congestion/rhinnorhea.      Mouth/Throat: Mucous membranes are moist.      Neck: No stridor. Hematological/Lymphatic/Immunilogical: No cervical lymphadenopathy. Cardiovascular: Normal rate, regular rhythm.  No murmurs, rubs, or gallops.  Respiratory: Normal respiratory effort without tachypnea nor retractions. Breath sounds are clear and equal bilaterally. No wheezes/rales/rhonchi. Gastrointestinal: Soft and non tender. No rebound. No guarding.  Genitourinary: Deferred Musculoskeletal: Normal range of motion in all extremities. No lower extremity edema. Neurologic:  Normal speech and language. No gross focal neurologic deficits are appreciated.  Skin:  Insertion site of PD catheter without surrounding erythema, no pus.  Psychiatric: Mood and affect are normal. Speech and behavior are normal. Patient exhibits appropriate insight and judgment.  ____________________________________________    LABS (pertinent positives/negatives)  BMP na 136, k 3.4, glu 192, cr 9.82 Lactic acid 1.6 CBC wbc 13.4, hgb 8.9, plt 280  ____________________________________________   EKG  I, Nance Pear, attending physician, personally viewed and interpreted this EKG  EKG Time: 1222 Rate: 63 Rhythm: sinus rhythm Axis: left axis deviation Intervals: qtc 536 QRS: LAFB ST changes: no st elevation Impression: abnormal ekg   ____________________________________________    RADIOLOGY  None  ____________________________________________   PROCEDURES  Procedures  ____________________________________________   INITIAL IMPRESSION / ASSESSMENT AND PLAN / ED COURSE  Pertinent labs & imaging results that were available during my care of the patient were reviewed by me and considered in my medical decision making (see chart for details).   Patient presented from appointment at  dialysis center today because of concerns for low blood pressure.  Patient states she has been feeling weak.  Work-up is concerning for some anemia, hypocalcemia and persistent hypotension.  Did discuss with Dr. Juleen China. Will have dialysis nurse draw off peritoneal fluid for analysis. Will plan on admission.    ____________________________________________   FINAL CLINICAL IMPRESSION(S) / ED DIAGNOSES  Final diagnoses:  Hypotension, unspecified hypotension type  Hypocalcemia  Weakness     Note: This dictation was prepared with Dragon dictation. Any transcriptional errors that result from this process are unintentional     Nance Pear, MD 09/07/17 [phone]

## 2017-09-07 NOTE — Progress Notes (Signed)
[facility]  ROUNDING NOTE   Subjective:   Jacqueline Rodriguez presents to [facility] ED with Hypocalcemia [E83.51] Weakness [R53.1] Hypotension, unspecified hypotension type [I95.9]  Patient went to peritoneal dialysis clinic this morning where she was found to be lethargic, weak, unable to ambulate and with weight loss. She has not been in clinic for over 2 months.   Her dialysis machine recordings show that she has missed 22 treatments in the last 30 days.   Patient complains of nausea, vomiting, poor appetite and dysguesia.   Husband at bedside.   She states that she does not want to do hemodialysis.   Objective:  Vital signs in last 24 hours:  Temp:  [97.8 F (36.6 C)] 97.8 F (36.6 C) (08/28 1811) Pulse Rate:  [55-64] 64 (08/28 1811) Resp:  [9-20] 18 (08/28 1811) BP: (73-111)/(44-81) 91/71 (08/28 1811) SpO2:  [93 %-100 %] 100 % (08/28 1811) Weight:  [95.7 kg] 95.7 kg (08/28 1216)  Weight change:  Filed Weights   09/07/17 1216  Weight: 95.7 kg    Intake/Output: No intake/output data recorded.   Intake/Output this shift:  Total I/O In: 102.2 [I.V.:2.2; IV Piggyback:100] Out: -   Physical Exam: General: NAD, laying in bed  Head: Normocephalic, atraumatic. Moist oral mucosal membranes  Eyes: Anicteric, PERRL  Neck: Supple, trachea midline  Lungs:  Clear to auscultation  Heart: Regular rate and rhythm  Abdomen:  Soft, nontender,   Extremities: no peripheral edema.  Neurologic: Nonfocal, moving all four extremities  Skin: No lesions  Access: PD catheter with crusting drainage Left AVF +thrill +bruit    Basic Metabolic Panel: Recent Labs  Lab 09/07/17 1221  NA 136  K 3.4*  CL 95*  CO2 28  GLUCOSE 192*  BUN 39*  CREATININE 9.82*  CALCIUM 6.9*    Liver Function Tests: No results for input(s): AST, ALT, ALKPHOS, BILITOT, PROT, ALBUMIN in the last 168 hours. No results for input(s): LIPASE, AMYLASE in the last 168 hours. No results for input(s):  AMMONIA in the last 168 hours.  CBC: Recent Labs  Lab 09/07/17 1221  WBC 13.4*  NEUTROABS 11.1*  HGB 8.9*  HCT 26.9*  MCV 87.6  PLT 280    Cardiac Enzymes: No results for input(s): CKTOTAL, CKMB, CKMBINDEX, TROPONINI in the last 168 hours.  BNP: Invalid input(s): POCBNP  CBG: No results for input(s): GLUCAP in the last 168 hours.  Microbiology: Results for orders placed or performed during the hospital encounter of 09/08/16  Urine culture     Status: Abnormal   Collection Time: 09/08/16 12:27 PM  Result Value Ref Range Status   Specimen Description URINE, RANDOM  Final   Special Requests NONE  Final   Culture MULTIPLE SPECIES PRESENT, SUGGEST RECOLLECTION (A)  Final   Report Status 09/10/2016 FINAL  Final  MRSA PCR Screening     Status: None   Collection Time: 09/08/16  6:05 PM  Result Value Ref Range Status   MRSA by PCR NEGATIVE NEGATIVE Final    Comment:        The GeneXpert MRSA Assay (FDA approved for NASAL specimens only), is one component of a comprehensive MRSA colonization surveillance program. It is not intended to diagnose MRSA infection nor to guide or monitor treatment for MRSA infections.     Coagulation Studies: No results for input(s): LABPROT, INR in the last 72 hours.  Urinalysis: No results for input(s): COLORURINE, LABSPEC, PHURINE, GLUCOSEU, HGBUR, BILIRUBINUR, KETONESUR, PROTEINUR, UROBILINOGEN, NITRITE, LEUKOCYTESUR in the last  72 hours.  Invalid input(s): APPERANCEUR    Imaging: No results found.   Medications:   . ceFEPime (MAXIPIME) IV    . vancomycin     . insulin aspart  0-5 Units Subcutaneous QHS  . [START ON 09/08/2017] insulin aspart  0-9 Units Subcutaneous TID WC  . lidocaine-prilocaine   Topical Once  . vancomycin variable dose per unstable renal function (pharmacist dosing)   Does not apply See admin instructions     Assessment/ Plan:  [name] is a 57 y.o. white female with end stage renal disease on  peritoneal dialysis, hypertension, diabetes mellitus type II, diabetic retinopathy, diabetic neuropathy, COPD, pulmonary hypertension, peripheral vascular disease  [facility] Peritoneal dialysis EDW 95kg CCPD 5 cycles 3 litres with 1 liter last fill  1. End Stage Renal Disease on peritoneal dialysis. Patient showing noncompliance and is not performing her dialysis at home. Now with uremic symptoms.  - Will reinitiate hemodialysis treatment. Start outpatient planning. Orders prepared to start hemodialysis.   2. Hypotension: sepsis versus hypovolemia - Continue IV fluids - hold home blood pressure medications - Cultures pending - Empiric antibiotics.   3. Anemia: normocytic 8.9. Has missed her procrit for over 2 months - Start EPO with HD treatments.   4. Secondary Hyperparathyroidism: with hypocalcemia. Consistent with inadequate dialysis  - Check PTH and phosphorus.    LOS: 0 [name] 8/28/20196:13 PM

## 2017-09-07 NOTE — Progress Notes (Signed)
Pharmacy Antibiotic Note  Jacqueline Rodriguez is a 57 y.o. female admitted on 09/07/2017 with peritonitis/PD site infection.  Pharmacy has been consulted for cefepime and vancomycin dosing.  Plan: 1. Cefepime 2 gm IV 48H 2. Vancomycin 2 gm IV x 1, will check random level 3 to 4 days after first IV dose and re-dose as appropriate. Pharmacy will also continue to follow for potential shift from PD to HD as that will affect dosing.   Height: 5\' 7"  (170.2 cm) Weight: 211 lb (95.7 kg) IBW/kg (Calculated) : 61.6  Temp (24hrs), Avg:97.8 F (36.6 C), Min:97.8 F (36.6 C), Max:97.8 F (36.6 C)  Recent Labs  Lab 09/07/17 1221 09/07/17 1311  WBC 13.4*  --   CREATININE 9.82*  --   LATICACIDVEN  --  1.6    Estimated Creatinine Clearance: 7.3 mL/min (A) (by C-G formula based on SCr of 9.82 mg/dL (H)).    Allergies  Allergen Reactions  . Ambien [Zolpidem] Other (See Comments)    hallucinations    Antimicrobials this admission:   Dose adjustments this admission:   Microbiology results:  BCx:   UCx:    Sputum:    MRSA PCR:   Thank you for allowing pharmacy to be a part of this patient's care.  Laural Benes, Pharm.D., BCPS Clinical Pharmacist 09/07/2017 5:47 PM

## 2017-09-07 NOTE — ED Notes (Addendum)
EDP to bedside to provide patient and family with update.

## 2017-09-07 NOTE — H&P (Signed)
[facility] NAME: Jacqueline Rodriguez    MR#:  [id number]  DATE OF BIRTH:  26-Dec-1960  DATE OF ADMISSION:  09/07/2017  PRIMARY CARE PHYSICIAN: Hortencia Pilar, MD   REQUESTING/REFERRING PHYSICIAN: Dr. Nance Pear  CHIEF COMPLAINT:   Chief Complaint  Patient presents with  . Hypotension    HISTORY OF PRESENT ILLNESS:  Jacqueline Rodriguez  is a 57 y.o. female with a known history of end-stage renal disease on peritoneal dialysis, COPD, hypertension, obesity, peripheral vascular disease, anemia of chronic disease, diabetes who presents to the hospital due to weakness, hypotension.  Patient was seen by her dialysis nurse today and noticed some drainage near her PD catheter site.  Patient was also noted to be hypotensive with systolic blood pressures in the 80s and complained of some generalized weakness and therefore was sent to the ER for further evaluation.  In the emergency room patient was noted to be hypotensive and also noted to be hypocalcemic on her blood work along with some chronic anemia.  Despite getting some fluids patient's blood pressure continues to be on the low side and therefore hospitalist services were contacted for admission.  Patient admits to some chills but no fever, she admits to some intermittent nausea but no vomiting or diarrhea, she denies any melena hematochezia hematuria or any other associated symptoms presently.  PAST MEDICAL HISTORY:   Past Medical History:  Diagnosis Date  . Anemia   . Chronic bronchitis ([place])   . Chronic diastolic CHF (congestive heart failure) ([place])   . CKD (chronic kidney disease), stage III ([place])   . COPD (chronic obstructive pulmonary disease) ([place])   . Diabetes mellitus with renal complications ([place])   . Essential hypertension   . GERD (gastroesophageal reflux disease)   . Obesity   . Peripheral vascular disease ([place])   . Pulmonary hypertension ([place])   . Renal insufficiency 09/19/2015   Stage 3 CKD. Beginning dialysis.    PAST SURGICAL HISTORY:   Past Surgical History:  Procedure Laterality Date  . A/V FISTULAGRAM N/A 09/16/2016   Procedure: A/V Fistulagram;  Surgeon: Algernon Huxley, MD;  Location: [facility];  Service: Cardiovascular;  Laterality: N/A;  . AV FISTULA PLACEMENT Left 10/01/2015   Procedure: ARTERIOVENOUS (AV) FISTULA CREATION ( BRACHIAL CEPHALIC );  Surgeon: Algernon Huxley, MD;  Location: [facility];  Service: Vascular;  Laterality: Left;  . CAPD INSERTION N/A 06/10/2016   Procedure: LAPAROSCOPIC INSERTION CONTINUOUS AMBULATORY PERITONEAL DIALYSIS  (CAPD) CATHETER;  Surgeon: Algernon Huxley, MD;  Location: [facility];  Service: Vascular;  Laterality: N/A;  . DIALYSIS/PERMA CATHETER INSERTION  09/16/2016   Procedure: DIALYSIS/PERMA CATHETER INSERTION;  Surgeon: Algernon Huxley, MD;  Location: [place];  Service: Cardiovascular;;  . DIALYSIS/PERMA CATHETER REMOVAL N/A 12/06/2016   Procedure: DIALYSIS/PERMA CATHETER REMOVAL;  Surgeon: Algernon Huxley, MD;  Location: [place];  Service: Cardiovascular;  Laterality: N/A;  . PERIPHERAL VASCULAR CATHETERIZATION N/A 07/21/2015   Procedure: Dialysis/Perma Catheter;  Surgeon: Algernon Huxley, MD;  Location: [facility];  Service: Cardiovascular;  Laterality: N/A;  . PERIPHERAL VASCULAR CATHETERIZATION N/A 12/11/2015   Procedure: Dialysis/Perma Catheter Removal;  Surgeon: Algernon Huxley, MD;  Location: [facility];  Service: Cardiovascular;  Laterality: N/A;    SOCIAL HISTORY:   Social History   Tobacco Use  . Smoking status: Former Smoker    Packs/day: 0.50    Years: 5.00  Pack years: 2.50    Types: Cigarettes    Last attempt to quit: 03/24/1985    Years since quitting: 32.4  . Smokeless tobacco: Never Used  Substance Use Topics  . Alcohol use: No    Alcohol/week: 0.0 standard drinks    FAMILY HISTORY:   Family History  Problem Relation Age of Onset  . Hypertension Mother   .  Diabetes Mellitus II Mother   . CAD Father   . Hypertension Father   . Heart attack Father     DRUG ALLERGIES:   Allergies  Allergen Reactions  . Ambien [Zolpidem] Other (See Comments)    hallucinations    REVIEW OF SYSTEMS:   Review of Systems  Constitutional: Negative for fever and weight loss.  HENT: Negative for congestion, nosebleeds and tinnitus.   Eyes: Negative for blurred vision, double vision and redness.  Respiratory: Negative for cough, hemoptysis and shortness of breath.   Cardiovascular: Negative for chest pain, orthopnea, leg swelling and PND.  Gastrointestinal: Positive for nausea and vomiting. Negative for abdominal pain, diarrhea and melena.  Genitourinary: Negative for dysuria, hematuria and urgency.  Musculoskeletal: Negative for falls and joint pain.  Neurological: Positive for weakness (generalized). Negative for dizziness, tingling, sensory change, focal weakness, seizures and headaches.  Endo/Heme/Allergies: Negative for polydipsia. Does not bruise/bleed easily.  Psychiatric/Behavioral: Negative for depression and memory loss. The patient is not nervous/anxious.     MEDICATIONS AT HOME:   Prior to Admission medications   Medication Sig Start Date End Date Taking? Authorizing Provider  amLODipine (NORVASC) 5 MG tablet Take 5 mg by mouth daily. 06/22/17  Yes [provider]  aspirin EC 81 MG EC tablet Take 1 tablet (81 mg total) by mouth daily. Patient taking differently: Take 162 mg by mouth at bedtime.  03/20/15  Yes Vaughan Basta, MD  cloNIDine (CATAPRES) 0.2 MG tablet Take 0.2 mg by mouth 2 (two) times daily. 07/02/17  Yes [provider]  furosemide (LASIX) 40 MG tablet Take 1 tablet (40 mg total) by mouth daily. 09/17/16  Yes Wieting, Richard, MD  gabapentin (NEURONTIN) 100 MG capsule Take 100 mg by mouth 3 (three) times daily as needed. For neuropathy. 03/05/15  Yes [provider]  glipiZIDE (GLUCOTROL) 5 MG tablet  Take 5 mg by mouth daily.   Yes [provider]  GLUCOSAMINE-CHONDROITIN PO Take 4,000 mg by mouth daily.   Yes [provider]  lactulose (CHRONULAC) 10 GM/15ML solution Take 30 mLs by mouth every other day.  08/15/17  Yes [provider]  lisinopril (PRINIVIL,ZESTRIL) 10 MG tablet Take 1 tablet by mouth daily. 08/28/17  Yes [provider]  meclizine (ANTIVERT) 25 MG tablet Take 1 tablet (25 mg total) by mouth 3 (three) times daily as needed for dizziness. Patient taking differently: Take 25 mg by mouth daily.  12/08/15  Yes Nance Pear, MD  sevelamer carbonate (RENVELA) 800 MG tablet Take 1,600 mg by mouth 3 (three) times daily with meals.   Yes [provider]  acetaminophen (TYLENOL) 500 MG tablet Take 500 mg by mouth every 6 (six) hours as needed.    [provider]  albuterol (PROVENTIL HFA;VENTOLIN HFA) 108 (90 Base) MCG/ACT inhaler Inhale 2 puffs into the lungs every 6 (six) hours as needed for wheezing or shortness of breath. 01/08/15   Gregor Hams, MD  azithromycin [facility]) 250 MG tablet One tablet daily as per DR Ola Spurr infectious disease Patient not taking: Reported on 12/06/2016 09/17/16   Wieting,  Richard, MD  calcium acetate (PHOSLO) 667 MG capsule Take 3 capsules (2,001 mg total) by mouth 3 (three) times daily with meals. Patient not taking: Reported on 12/16/2016 09/17/16   Loletha Grayer, MD  cetirizine (ZYRTEC) 10 MG tablet Take 10 mg by mouth daily as needed for allergies.    [provider]  ferrous sulfate 325 (65 FE) MG EC tablet Take 1 tablet (325 mg total) by mouth 2 (two) times daily. Patient not taking: Reported on 09/07/2017 09/17/16 09/17/17  Loletha Grayer, MD  hydrALAZINE (APRESOLINE) 10 MG tablet Take 1 tablet (10 mg total) by mouth 3 (three) times daily. Patient not taking: Reported on 09/07/2017 12/04/16   Vaughan Basta, MD  HYDROcodone-acetaminophen (NORCO) 5-325 MG tablet Take 1  tablet by mouth every 6 (six) hours as needed for moderate pain. Patient not taking: Reported on 09/07/2017 06/10/16   Algernon Huxley, MD  magnesium oxide (MAG-OX) 400 (241.3 Mg) MG tablet Take 1 tablet (400 mg total) by mouth daily. Patient not taking: Reported on 09/07/2017 09/17/16   Loletha Grayer, MD  ondansetron (ZOFRAN) 4 MG tablet Take 1 tablet (4 mg total) by mouth every 6 (six) hours as needed for nausea. 09/17/16   Loletha Grayer, MD  sertraline (ZOLOFT) 100 MG tablet Take 1 tablet by mouth daily. 08/02/17   [provider]      VITAL SIGNS:  Blood pressure (!) 92/51, pulse 61, temperature 97.8 F (36.6 C), temperature source Oral, resp. rate 19, height 5\' 7"  (1.702 m), weight 95.7 kg, last menstrual period 09/18/2013, SpO2 99 %.  PHYSICAL EXAMINATION:  Physical Exam  GENERAL:  57 y.o.-year-old patient lying in the bed in no acute distress.  EYES: Pupils equal, round, reactive to light and accommodation. No scleral icterus. Extraocular muscles intact.  HEENT: Head atraumatic, normocephalic. Oropharynx and nasopharynx clear. No oropharyngeal erythema, moist oral mucosa  NECK:  Supple, no jugular venous distention. No thyroid enlargement, no tenderness.  LUNGS: Normal breath sounds bilaterally, no wheezing, rales, rhonchi. No use of accessory muscles of respiration.  CARDIOVASCULAR: S1, S2 RRR, II/VI SEM at RSB, No rubs, gallops, clicks.  ABDOMEN: Soft, nontender, nondistended. Bowel sounds present. No organomegaly or mass. + PD Cath in place with minimal drainage noted near exit site.   EXTREMITIES: No pedal edema, cyanosis, or clubbing. + 2 pedal & radial pulses b/l.   NEUROLOGIC: Cranial nerves II through XII are intact. No focal Motor or sensory deficits appreciated b/l. Globally weak PSYCHIATRIC: The patient is alert and oriented x 3.   SKIN: No obvious rash, lesion, or ulcer.   LABORATORY PANEL:   CBC Recent Labs  Lab 09/07/17 1221  WBC 13.4*  HGB 8.9*  HCT 26.9*   PLT 280   ------------------------------------------------------------------------------------------------------------------  Chemistries  Recent Labs  Lab 09/07/17 1221  NA 136  K 3.4*  CL 95*  CO2 28  GLUCOSE 192*  BUN 39*  CREATININE 9.82*  CALCIUM 6.9*   ------------------------------------------------------------------------------------------------------------------  Cardiac Enzymes No results for input(s): TROPONINI in the last 168 hours. ------------------------------------------------------------------------------------------------------------------  RADIOLOGY:  No results found.   IMPRESSION AND PLAN:   57 year old female with past medical history of end-stage renal disease on hemodialysis, hypertension, secondary hyperparathyroidism, hyperlipidemia, obesity, chronic diastolic CHF, who presents to the hospital due to hypotension, weakness and noted to have some drainage near her PD catheter exit site.  1.  Sepsis- suspected to be secondary to near her PD cath site.  Patient was noted to have some drainage near her peritoneal  dialysis catheter site by her dialysis nurse earlier.  She was noted to be hypotensive and weak.  Patient meets criteria given her hypotension, leukocytosis. - We will empirically treat the patient with IV vancomycin, cefepime.  Follow cultures.  Patient's PD fluid has also been tested.  2.  End-stage renal disease on peritoneal dialysis- patient has had no problem doing her exchanges at home but noted to have some drainage near her PD catheter exit site.  We will consult nephrology, continue her PD exchanges as tolerated, they are considering switching her to hemodialysis as patient has been noncompliant with her peritoneal dialysis.  3.  Hypotension-suspected to be secondary to sepsis. - We will hold all her antihypertensives, continue gentle IV fluids.  4.  Diabetes type 2 with renal complication- continue sliding scale insulin, glipizide.   Continue carb controlled diet, follow blood sugars.  5.  Neuropathy-continue gabapentin.  6.  Secondary hyperparathyroidism-continue Renvela.  7.  Anemia of chronic disease-secondary to end-stage renal disease, no acute need for transfusion.  Follow hemoglobin.  8.  Essential hypertension-hold all antihypertensives given patient's relative hypotension.    All the records are reviewed and case discussed with ED provider. Management plans discussed with the patient, family and they are in agreement.  CODE STATUS: Full code  TOTAL TIME TAKING CARE OF THIS PATIENT: 45 minutes.    Henreitta Leber M.D on 09/07/2017 at 5:31 PM  Between 7am to 6pm - Pager - [phone]  After 6pm go to [email or web address] - password [email or web address] [facility]  Office  [phone]  CC: Primary care physician; Hortencia Pilar, MD

## 2017-09-07 NOTE — ED Triage Notes (Signed)
Pt in via [facility] from [place] for monthly check up.  Pt does peritoneal dialysis 7 nights a week, sent here for hypotension.  Pt pale, hypotensive upon arrival 73/38.  Pt A/Ox4.  EDP to bedside.

## 2017-09-07 NOTE — Progress Notes (Signed)
   09/07/17 1855  Clinical Encounter Type  Visited With Patient  Visit Type Initial (advanced directive order)  Referral From Nurse  Consult/Referral To Chaplain  Stress Factors  Patient Stress Factors Health changes   Patient spoke of extended time in ED today and events during dialysis.  Chaplain provided education on advanced directives.  Use of active and reflective listening as patient processed who she wanted her HCPOAs (spouse and son) to be and what her hopes were for quality of life and being present for her grandchildren as long as she is able.  Patient expressed feelings of fatigue so document was left on the windowsill.  Patient requests follow up from chaplain on 09/08/17.  Order to remain open.

## 2017-09-07 NOTE — ED Notes (Signed)
Nephrology to bedside at this time.

## 2017-09-07 NOTE — ED Notes (Signed)
Dialysis RN to bedside at this time.

## 2017-09-07 NOTE — ED Notes (Signed)
Pt provided graham crackers and peanut butter upon request.

## 2017-09-08 DIAGNOSIS — E669 Obesity, unspecified: Secondary | ICD-10-CM | POA: Diagnosis present

## 2017-09-08 DIAGNOSIS — Z7982 Long term (current) use of aspirin: Secondary | ICD-10-CM | POA: Diagnosis not present

## 2017-09-08 DIAGNOSIS — K219 Gastro-esophageal reflux disease without esophagitis: Secondary | ICD-10-CM | POA: Diagnosis present

## 2017-09-08 DIAGNOSIS — Z8249 Family history of ischemic heart disease and other diseases of the circulatory system: Secondary | ICD-10-CM | POA: Diagnosis not present

## 2017-09-08 DIAGNOSIS — D509 Iron deficiency anemia, unspecified: Secondary | ICD-10-CM | POA: Diagnosis not present

## 2017-09-08 DIAGNOSIS — N2581 Secondary hyperparathyroidism of renal origin: Secondary | ICD-10-CM | POA: Diagnosis present

## 2017-09-08 DIAGNOSIS — D638 Anemia in other chronic diseases classified elsewhere: Secondary | ICD-10-CM | POA: Diagnosis present

## 2017-09-08 DIAGNOSIS — A419 Sepsis, unspecified organism: Secondary | ICD-10-CM | POA: Diagnosis not present

## 2017-09-08 DIAGNOSIS — E1122 Type 2 diabetes mellitus with diabetic chronic kidney disease: Secondary | ICD-10-CM | POA: Diagnosis present

## 2017-09-08 DIAGNOSIS — Z992 Dependence on renal dialysis: Secondary | ICD-10-CM | POA: Diagnosis not present

## 2017-09-08 DIAGNOSIS — Z833 Family history of diabetes mellitus: Secondary | ICD-10-CM | POA: Diagnosis not present

## 2017-09-08 DIAGNOSIS — I132 Hypertensive heart and chronic kidney disease with heart failure and with stage 5 chronic kidney disease, or end stage renal disease: Secondary | ICD-10-CM | POA: Diagnosis present

## 2017-09-08 DIAGNOSIS — D631 Anemia in chronic kidney disease: Secondary | ICD-10-CM | POA: Diagnosis not present

## 2017-09-08 DIAGNOSIS — Z9115 Patient's noncompliance with renal dialysis: Secondary | ICD-10-CM | POA: Diagnosis not present

## 2017-09-08 DIAGNOSIS — E1151 Type 2 diabetes mellitus with diabetic peripheral angiopathy without gangrene: Secondary | ICD-10-CM | POA: Diagnosis present

## 2017-09-08 DIAGNOSIS — Z7984 Long term (current) use of oral hypoglycemic drugs: Secondary | ICD-10-CM | POA: Diagnosis not present

## 2017-09-08 DIAGNOSIS — I5032 Chronic diastolic (congestive) heart failure: Secondary | ICD-10-CM | POA: Diagnosis present

## 2017-09-08 DIAGNOSIS — I959 Hypotension, unspecified: Secondary | ICD-10-CM | POA: Diagnosis present

## 2017-09-08 DIAGNOSIS — E1140 Type 2 diabetes mellitus with diabetic neuropathy, unspecified: Secondary | ICD-10-CM | POA: Diagnosis present

## 2017-09-08 DIAGNOSIS — Z87891 Personal history of nicotine dependence: Secondary | ICD-10-CM | POA: Diagnosis not present

## 2017-09-08 DIAGNOSIS — E861 Hypovolemia: Secondary | ICD-10-CM | POA: Diagnosis present

## 2017-09-08 DIAGNOSIS — D649 Anemia, unspecified: Secondary | ICD-10-CM | POA: Diagnosis not present

## 2017-09-08 DIAGNOSIS — E11319 Type 2 diabetes mellitus with unspecified diabetic retinopathy without macular edema: Secondary | ICD-10-CM | POA: Diagnosis present

## 2017-09-08 DIAGNOSIS — I120 Hypertensive chronic kidney disease with stage 5 chronic kidney disease or end stage renal disease: Secondary | ICD-10-CM | POA: Diagnosis not present

## 2017-09-08 DIAGNOSIS — N186 End stage renal disease: Secondary | ICD-10-CM | POA: Diagnosis not present

## 2017-09-08 DIAGNOSIS — E785 Hyperlipidemia, unspecified: Secondary | ICD-10-CM | POA: Diagnosis present

## 2017-09-08 DIAGNOSIS — F329 Major depressive disorder, single episode, unspecified: Secondary | ICD-10-CM | POA: Diagnosis present

## 2017-09-08 DIAGNOSIS — E559 Vitamin D deficiency, unspecified: Secondary | ICD-10-CM | POA: Diagnosis not present

## 2017-09-08 DIAGNOSIS — J449 Chronic obstructive pulmonary disease, unspecified: Secondary | ICD-10-CM | POA: Diagnosis present

## 2017-09-08 LAB — GLUCOSE, CAPILLARY
GLUCOSE-CAPILLARY: 64 mg/dL — AB (ref 70–99)
GLUCOSE-CAPILLARY: 85 mg/dL (ref 70–99)
GLUCOSE-CAPILLARY: 85 mg/dL (ref 70–99)
Glucose-Capillary: 79 mg/dL (ref 70–99)
Glucose-Capillary: 103 mg/dL — ABNORMAL HIGH (ref 70–99)

## 2017-09-08 LAB — BASIC METABOLIC PANEL
ANION GAP: 10 (ref 5–15)
BUN: 41 mg/dL — ABNORMAL HIGH (ref 6–20)
CALCIUM: 6.9 mg/dL — AB (ref 8.9–10.3)
CO2: 28 mmol/L (ref 22–32)
CREATININE: 10.16 mg/dL — AB (ref 0.44–1.00)
Chloride: 99 mmol/L (ref 98–111)
GFR, EST AFRICAN AMERICAN: 4 mL/min — AB (ref >60)
GFR, EST NON AFRICAN AMERICAN: 4 mL/min — AB (ref >60)
Glucose, Bld: 73 mg/dL (ref 70–99)
Potassium: 3.4 mmol/L — ABNORMAL LOW (ref 3.5–5.1)
SODIUM: 137 mmol/L (ref 135–145)

## 2017-09-08 LAB — CBC
HEMATOCRIT: 24 % — AB (ref 35.0–47.0)
HEMOGLOBIN: 8 g/dL — AB (ref 12.0–16.0)
MCH: 29.4 pg (ref 26.0–34.0)
MCHC: 33.5 g/dL (ref 32.0–36.0)
MCV: 87.7 fL (ref 80.0–100.0)
PLATELETS: 246 10*3/uL (ref 150–440)
RBC: 2.73 MIL/uL — AB (ref 3.80–5.20)
RDW: 13.6 % (ref 11.5–14.5)
WBC: 11.4 10*3/uL — ABNORMAL HIGH (ref 3.6–11.0)

## 2017-09-08 LAB — PATHOLOGIST SMEAR REVIEW

## 2017-09-08 MED ORDER — EPOETIN ALFA 10000 UNIT/ML IJ SOLN
10000.0000 [IU] | INTRAMUSCULAR | Status: DC
  Administered 2017-09-09: 10000 [IU] via INTRAVENOUS

## 2017-09-08 MED ORDER — EPOETIN ALFA 10000 UNIT/ML IJ SOLN: 10000.0000 [IU] | INTRAMUSCULAR | Status: DC

## 2017-09-08 MED ORDER — VANCOMYCIN HCL IN DEXTROSE 1-5 GM/200ML-% IV SOLN
1000.0000 mg | Freq: Once | INTRAVENOUS | Status: AC
  Administered 2017-09-08: 1000 mg via INTRAVENOUS
  Filled 2017-09-08: qty 200

## 2017-09-08 MED ORDER — TUBERCULIN PPD 5 UNIT/0.1ML ID SOLN
5.0000 [IU] | Freq: Once | INTRADERMAL | Status: AC
  Administered 2017-09-08: 5 [IU] via INTRADERMAL
  Filled 2017-09-08 (×2): qty 0.1

## 2017-09-08 MED ORDER — SODIUM CHLORIDE 0.9 % IV SOLN
1.0000 g | INTRAVENOUS | Status: DC
  Administered 2017-09-08 – 2017-09-09 (×2): 1 g via INTRAVENOUS
  Filled 2017-09-08 (×2): qty 1

## 2017-09-08 MED ORDER — VANCOMYCIN HCL IN DEXTROSE 1-5 GM/200ML-% IV SOLN
1000.0000 mg | INTRAVENOUS | Status: DC | PRN
  Filled 2017-09-08: qty 200

## 2017-09-08 NOTE — Progress Notes (Signed)
Hypoglycemic Event  CBG: 64   Treatment: 1/2 ginger ale  Symptoms: asymptomatic  Follow-up CBG: [id number] CBG:79    [place]

## 2017-09-08 NOTE — Progress Notes (Signed)
HD tx end. Tx terminated 1hour 18 minutes prior to tx end r/t infiltration., MD aware.    09/08/17 2015  Vital Signs  Pulse Rate 81  Pulse Rate Source Monitor  Resp 18  BP (!) 151/110  BP Location Right Leg  BP Method Automatic  Patient Position (if appropriate) Lying  Oxygen Therapy  SpO2 100 %  O2 Device Room Air  During Hemodialysis Assessment  Intra-Hemodialysis Comments Tx completed;See progress note (pt infiltrated, unable to rinse pt back, MD aware. )

## 2017-09-08 NOTE — Consult Note (Signed)
Pharmacy Antibiotic Note  Jacqueline Rodriguez is a 57 y.o. female admitted on 09/07/2017 with peritonitis.  Pharmacy has been consulted for Cefepime + Vancomycin dosing.  Plan: ESRD patient was on peritoneal dialysis but was non-compliant, so now converting to hemodialysis to improve compliance. Cefepime 1 gram every 24 hours Vancomycin 1 gram with dialysis. Goal trough 15-25.  Height: 5\' 7"  (170.2 cm) Weight: 211 lb (95.7 kg) IBW/kg (Calculated) : 61.6  Temp (24hrs), Avg:97.9 F (36.6 C), Min:97.8 F (36.6 C), Max:98.2 F (36.8 C)  Recent Labs  Lab 09/07/17 1221 09/07/17 1311 09/07/17 1855 09/08/17 0430  WBC 13.4*  --   --  11.4*  CREATININE 9.82*  --   --  10.16*  LATICACIDVEN  --  1.6 1.5  --     Estimated Creatinine Clearance: 7.1 mL/min (A) (by C-G formula based on SCr of 10.16 mg/dL (H)).    Allergies  Allergen Reactions  . Ambien [Zolpidem] Other (See Comments)    hallucinations    Antimicrobials this admission: Vancomycin 0828 >>  Cefepime 0829 >>    Microbiology results: 0828 BCx: NGTD 0828 Fluid cx (peritoneal cavity) NGTD 3704 MRSA PCR: negative  Thank you for allowing pharmacy to be a part of this patient's care.    Paticia Stack, PharmD Pharmacy Resident  09/08/2017 8:49 AM

## 2017-09-08 NOTE — Progress Notes (Signed)
Inpatient Diabetes Program Recommendations  AACE/ADA: New Consensus Statement on Inpatient Glycemic Control (2015)  Target Ranges:  Prepandial:   less than 140 mg/dL      Peak postprandial:   less than 180 mg/dL (1-2 hours)      Critically ill patients:  140 - 180 mg/dL   Results for ZARETH, RIPPETOE (MRN [id number]) as of 09/08/2017 09:53  Ref. Range 09/08/2017 07:38  Glucose-Capillary Latest Ref Range: 70 - 99 mg/dL 64 (L)      Admit Hypotension/ Sepsis  History: DM, ESRD (PD at home)  Home DM Meds: Glipizide 5 mg daily  Current Orders: Novolog Sensitive Correction Scale/ SSI (0-9 units) TID AC + HS     Glipizide 5 mg daily      MD- Please consider discontinuing Glipizide 5 mg daily for now  Can resume at time of discharge      --Will follow patient during hospitalization--  Wyn Quaker RN, MSN, CDE Diabetes Coordinator Inpatient Glycemic Control Team Team Pager: [phone] (8a-5p)

## 2017-09-08 NOTE — Progress Notes (Signed)
   09/08/17 1100  Clinical Encounter Type  Visited With Patient  Visit Type Follow-up (Advanced Directives Education)  Referral From Physician  Consult/Referral To Chaplain  Recommendations Follow-up as requried  Spiritual Encounters  Spiritual Needs Emotional;Prayer  Stress Factors  Patient Stress Factors Health changes  Advance Directives (For Healthcare)  Does Patient Have a Medical Advance Directive?  (Patient will complete AD at home and return to [facility])  [place] Directives  Does Patient Have a [place] Directive?  (Patient will complete AD and return to [facility])   Chaplain inquired about Advanced Directives and patient believes she will complete the AD with the help of her daughter-in-law at home. Chaplain explained process for completion off-campus and how to return the document to [facility]. Chaplain remained and discussed reality television, her declining health, faith in God, and family dynamics. Patient is tired and is considering whether to get on the transplant list for a kidney/  She may need additional education and support. Chaplain provided active listening, emotional support, and prayer. Patient was appreciative of the visit.

## 2017-09-08 NOTE — Progress Notes (Signed)
Post HD assessment. Pt tolerated tx well without c/o. Pt bent arm while using bedpan and venous access infiltrated, MD aware. Multiple unsuccessful attempts to reinitiate tx, unable to gain new venous access, MD aware. Pt was unable to get scheduled dose of vancomycin,  To be given during the last hour of HD tx, RN/MD aware. Tx terminated early r/t infiltration, 1 hour 18 minutes left in tx time. Pt will try HD tx again tomorrow. Unable to rinse pt back, estimated blood loss 250 ml. Net UF -4, goal not met.     09/08/17 2045  Vital Signs  Temp 97.6 F (36.4 C)  Temp Source Oral  Pulse Rate 69  Pulse Rate Source Monitor  Resp 11  BP (!) 126/44  BP Location Right Leg  BP Method Automatic  Patient Position (if appropriate) Lying  Oxygen Therapy  SpO2 100 %  O2 Device Room Air  Dialysis Weight  Weight 91.9 kg  Type of Weight Post-Dialysis  Post-Hemodialysis Assessment  Rinseback Volume (mL) 0 mL (unable to rinse pt back)  KECN 20.3 V  Dialyzer Clearance Heavily streaked  Duration of HD Treatment -hour(s) 1.25 hour(s)  Hemodialysis Intake (mL) 250 mL  UF Total -Machine (mL) 246 mL  Net UF (mL) -4 mL  Tolerated HD Treatment Yes  AVG/AVF Arterial Site Held (minutes) 10 minutes  AVG/AVF Venous Site Held (minutes) 10 minutes  Education / Care Plan  Dialysis Education Provided Yes  Documented Education in Care Plan Yes  Fistula / Graft Left Upper arm Arteriovenous fistula  Placement Date/Time: 10/01/15 1110   Placed prior to admission: No  Orientation: Left  Access Location: Upper arm  Access Type: Arteriovenous fistula  Site Condition No complications;Other (Comment) (pt bent arm and infiltrated venous access during tx,MD aware)  Fistula / Graft Assessment Present;Thrill;Bruit  Status Deaccessed  Drainage Description None

## 2017-09-08 NOTE — Progress Notes (Signed)
[facility] [place] NAME: Jacqueline Rodriguez    MR#:  [id number]  DATE OF BIRTH:  1960/03/31  SUBJECTIVE:  CHIEF COMPLAINT:   Chief Complaint  Patient presents with  . Hypotension   -Abdominal pain is improved.  Blood pressure is improving.  Will be started on hemodialysis today  REVIEW OF SYSTEMS:  Review of Systems  Constitutional: Positive for malaise/fatigue. Negative for chills and fever.  HENT: Negative for congestion, ear discharge, hearing loss and nosebleeds.   Eyes: Negative for blurred vision and double vision.  Respiratory: Negative for cough, shortness of breath and wheezing.   Cardiovascular: Negative for chest pain, palpitations and leg swelling.  Gastrointestinal: Positive for abdominal pain. Negative for constipation, diarrhea, nausea and vomiting.  Genitourinary: Negative for dysuria.  Musculoskeletal: Negative for myalgias.  Neurological: Negative for dizziness, focal weakness, seizures, weakness and headaches.  Psychiatric/Behavioral: Negative for depression.    DRUG ALLERGIES:   Allergies  Allergen Reactions  . Ambien [Zolpidem] Other (See Comments)    hallucinations    VITALS:  Blood pressure (!) 131/59, pulse 63, temperature 98.9 F (37.2 C), temperature source Oral, resp. rate 16, height 5\' 7"  (1.702 m), weight 95.7 kg, last menstrual period 09/18/2013, SpO2 96 %.  PHYSICAL EXAMINATION:  Physical Exam  GENERAL:  57 y.o.-year-old patient lying in the bed with no acute distress.  EYES: Pupils equal, round, reactive to light and accommodation. No scleral icterus. Extraocular muscles intact.  HEENT: Head atraumatic, normocephalic. Oropharynx and nasopharynx clear.  NECK:  Supple, no jugular venous distention. No thyroid enlargement, no tenderness.  LUNGS: Normal breath sounds bilaterally, no wheezing, rales,rhonchi or crepitation. No use of accessory muscles of respiration.  CARDIOVASCULAR: S1, S2 normal. No  murmurs, rubs, or gallops.  ABDOMEN: Soft, nontender, nondistended.  PD catheter in place with minimal purulent discharge around it.  Bowel sounds present. No organomegaly or mass.  EXTREMITIES: No pedal edema, cyanosis, or clubbing.  Left arm AV fistula with good thrill noted. NEUROLOGIC: Cranial nerves II through XII are intact. Muscle strength 5/5 in all extremities. Sensation intact. Gait not checked.  PSYCHIATRIC: The patient is alert and oriented x 3.  SKIN: No obvious rash, lesion, or ulcer.    LABORATORY PANEL:   CBC Recent Labs  Lab 09/08/17 0430  WBC 11.4*  HGB 8.0*  HCT 24.0*  PLT 246   ------------------------------------------------------------------------------------------------------------------  Chemistries  Recent Labs  Lab 09/07/17 1221 09/08/17 0430  NA 136 137  K 3.4* 3.4*  CL 95* 99  CO2 28 28  GLUCOSE 192* 73  BUN 39* 41*  CREATININE 9.82* 10.16*  CALCIUM 6.9* 6.9*  AST 17  --   ALT 10  --   ALKPHOS 59  --   BILITOT 0.6  --    ------------------------------------------------------------------------------------------------------------------  Cardiac Enzymes No results for input(s): TROPONINI in the last 168 hours. ------------------------------------------------------------------------------------------------------------------  RADIOLOGY:  No results found.  EKG:   Orders placed or performed during the hospital encounter of 09/07/17  . EKG 12-Lead  . EKG 12-Lead    ASSESSMENT AND PLAN:   57 year old female with past medical history significant for end-stage renal disease on peritoneal dialysis at home, COPD, hypertension, peripheral vascular disease, anemia and diabetes presents to hospital secondary to weakness and hypotension  1.  Hypotension- likely hypovolemia.  Sepsis cannot be ruled out given peritoneal dialysis catheter. -Cultures are pending.  Received IV fluid resuscitation and improved blood pressure -Hold home blood pressure  medications -On  empiric antibiotics with cefepime and vancomycin.   2.  End-stage renal disease-has been on peritoneal dialysis for the past year. -Unable to be compliant with the PD.  Will be restarted on hemodialysis this admission.  Will need 3 sessions of dialysis prior to discharge. -Appreciate nephrology consult.  Will need to have an outpatient dialysis chair set up prior to discharge.  3.  Diabetes mellitus-on glipizide, sliding scale insulin  4.  Depression-on Zoloft  5.  DVT prophylaxis-subcutaneous heparin  Encourage ambulation while in the hospital    All the records are reviewed and case discussed with Care Management/Social Workerr. Management plans discussed with the patient, family and they are in agreement.  CODE STATUS: Full Code  TOTAL TIME TAKING CARE OF THIS PATIENT: 38 minutes.   POSSIBLE D/C IN 2-3 DAYS, DEPENDING ON CLINICAL CONDITION.   Kingdavid Leinbach M.D on 09/08/2017 at 12:57 PM  Between 7am to 6pm - Pager - [phone]  After 6pm go to [email or web address] - password [email or web address] [facility]  Office  [phone]  CC: Primary care physician; Hortencia Pilar, MD

## 2017-09-08 NOTE — Progress Notes (Signed)
HD tx start    09/08/17 1850  Vital Signs  Pulse Rate 68  Pulse Rate Source Monitor  Resp 13  BP (!) 152/75  BP Location Right Arm  BP Method Automatic  Patient Position (if appropriate) Lying  Oxygen Therapy  SpO2 100 %  O2 Device Room Air  During Hemodialysis Assessment  Blood Flow Rate (mL/min) 250 mL/min  Arterial Pressure (mmHg) -170 mmHg  Venous Pressure (mmHg) 200 mmHg  Transmembrane Pressure (mmHg) 60 mmHg  Ultrafiltration Rate (mL/min) 200 mL/min  Dialysate Flow Rate (mL/min) 500 ml/min  Conductivity: Machine  13.9  HD Safety Checks Performed Yes  Dialysis Fluid Bolus Normal Saline  Bolus Amount (mL) 250 mL  Intra-Hemodialysis Comments Tx initiated  Fistula / Graft Left Upper arm Arteriovenous fistula  Placement Date/Time: 10/01/15 1110   Placed prior to admission: No  Orientation: Left  Access Location: Upper arm  Access Type: Arteriovenous fistula  Status Accessed  Needle Size 17

## 2017-09-08 NOTE — Progress Notes (Signed)
Pre HD assessment    09/08/17 1840  Neurological  Level of Consciousness Alert  Orientation Level Oriented X4  Respiratory  Respiratory Pattern Regular;Unlabored  Chest Assessment Chest expansion symmetrical  Cardiac  ECG Monitor Yes  Cardiac Rhythm NSR  Vascular  R Radial Pulse +2  L Radial Pulse +2  Integumentary  Integumentary (WDL) X  Skin Color Pale  Musculoskeletal  Musculoskeletal (WDL) X  Generalized Weakness Yes  Assistive Device None  GU Assessment  Genitourinary (WDL) X  Genitourinary Symptoms  (HD)  Psychosocial  Psychosocial (WDL) WDL

## 2017-09-08 NOTE — Progress Notes (Signed)
[facility]  ROUNDING NOTE   Subjective:   More agreeable to starting hemodialysis.   Objective:  Vital signs in last 24 hours:  Temp:  [97.8 F (36.6 C)-98.9 F (37.2 C)] 98.9 F (37.2 C) (08/29 1138) Pulse Rate:  [55-64] 63 (08/29 1138) Resp:  [11-20] 16 (08/29 1138) BP: (91-131)/(47-81) 131/59 (08/29 1138) SpO2:  [96 %-100 %] 96 % (08/29 1138)  Weight change:  Filed Weights   09/07/17 1216  Weight: 95.7 kg    Intake/Output: I/O last 3 completed shifts: In: 102.2 [I.V.:2.2; IV Piggyback:100] Out: 0    Intake/Output this shift:  Total I/O In: 457.9 [P.O.:358; IV Piggyback:99.9] Out: 0   Physical Exam: General: NAD, laying in bed  Head: Normocephalic, atraumatic. Moist oral mucosal membranes  Eyes: Anicteric, PERRL  Neck: Supple, trachea midline  Lungs:  Clear to auscultation  Heart: Regular rate and rhythm  Abdomen:  Soft, nontender,   Extremities: no peripheral edema.  Neurologic: Nonfocal, moving all four extremities  Skin: No lesions  Access: PD catheter with crusting  Left AVF +thrill +bruit    Basic Metabolic Panel: Recent Labs  Lab 09/07/17 1221 09/08/17 0430  NA 136 137  K 3.4* 3.4*  CL 95* 99  CO2 28 28  GLUCOSE 192* 73  BUN 39* 41*  CREATININE 9.82* 10.16*  CALCIUM 6.9* 6.9*    Liver Function Tests: Recent Labs  Lab 09/07/17 1221  AST 17  ALT 10  ALKPHOS 59  BILITOT 0.6  PROT 5.4*  ALBUMIN 2.2*   No results for input(s): LIPASE, AMYLASE in the last 168 hours. No results for input(s): AMMONIA in the last 168 hours.  CBC: Recent Labs  Lab 09/07/17 1221 09/08/17 0430  WBC 13.4* 11.4*  NEUTROABS 11.1*  --   HGB 8.9* 8.0*  HCT 26.9* 24.0*  MCV 87.6 87.7  PLT 280 246    Cardiac Enzymes: No results for input(s): CKTOTAL, CKMB, CKMBINDEX, TROPONINI in the last 168 hours.  BNP: Invalid input(s): POCBNP  CBG: Recent Labs  Lab 09/07/17 2123 09/08/17 0738 09/08/17 0832 09/08/17 1133  GLUCAP 166* 64* 79  103*    Microbiology: Results for orders placed or performed during the hospital encounter of 09/07/17  Blood culture (routine x 2)     Status: None (Preliminary result)   Collection Time: 09/07/17  1:11 PM  Result Value Ref Range Status   Specimen Description BLOOD  Final   Special Requests NONE  Final   Culture   Final    NO GROWTH < 24 HOURS Performed at [facility], [address] [place], [place] [address]    Report Status PENDING  Incomplete  Blood culture (routine x 2)     Status: None (Preliminary result)   Collection Time: 09/07/17  1:11 PM  Result Value Ref Range Status   Specimen Description BLOOD RIGHT WRIST  Final   Special Requests   Final    BOTTLES DRAWN AEROBIC AND ANAEROBIC Blood Culture adequate volume   Culture   Final    NO GROWTH < 24 HOURS Performed at [facility], [address] [place], [place] [address]    Report Status PENDING  Incomplete  Body fluid culture     Status: None (Preliminary result)   Collection Time: 09/07/17  4:38 PM  Result Value Ref Range Status   Specimen Description   Final    PERITONEAL CAVITY Performed at [facility], [address], [place], [place] [address]  Special Requests   Final    NONE Performed at [facility], [place], [place] [address]    Gram Stain   Final    WBC PRESENT,BOTH PMN AND MONONUCLEAR NO ORGANISMS SEEN CYTOSPIN SMEAR    Culture   Final    NO GROWTH < 12 HOURS Performed at [facility], [place] [address], [place], [place] [address]    Report Status PENDING  Incomplete  MRSA PCR Screening     Status: None   Collection Time: 09/07/17  6:30 PM  Result Value Ref Range Status   MRSA by PCR NEGATIVE NEGATIVE Final    Comment:        The GeneXpert MRSA Assay (FDA approved for NASAL specimens only), is one component of a comprehensive MRSA colonization surveillance program. It is not intended to diagnose MRSA infection nor to guide  or monitor treatment for MRSA infections. Performed at [facility] [address] [place], [place] [address]     Coagulation Studies: No results for input(s): LABPROT, INR in the last 72 hours.  Urinalysis: No results for input(s): COLORURINE, LABSPEC, PHURINE, GLUCOSEU, HGBUR, BILIRUBINUR, KETONESUR, PROTEINUR, UROBILINOGEN, NITRITE, LEUKOCYTESUR in the last 72 hours.  Invalid input(s): APPERANCEUR    Imaging: No results found.   Medications:   . ceFEPime (MAXIPIME) IV Stopped (09/08/17 1236)  . vancomycin     . aspirin EC  162 mg Oral QHS  . calcium acetate  2,001 mg Oral TID WC  . glipiZIDE  5 mg Oral Daily  . heparin  5,000 Units Subcutaneous Q8H  . insulin aspart  0-5 Units Subcutaneous QHS  . insulin aspart  0-9 Units Subcutaneous TID WC  . lactulose  20 g Oral QODAY  . lidocaine-prilocaine   Topical Once  . sertraline  100 mg Oral Daily  . sevelamer carbonate  1,600 mg Oral TID WC  . tuberculin  5 Units Intradermal Once  . vancomycin variable dose per unstable renal function (pharmacist dosing)   Does not apply See admin instructions     Assessment/ Plan:  [name] is a 57 y.o. white female with end stage renal disease on peritoneal dialysis, hypertension, diabetes mellitus type II, diabetic retinopathy, diabetic neuropathy, COPD, pulmonary hypertension, peripheral vascular disease  [facility] Peritoneal dialysis EDW 95kg CCPD 5 cycles 3 litres with 1 liter last fill  1. End Stage Renal Disease on peritoneal dialysis. Patient showing noncompliance and is not performing her dialysis at home. Now with uremic symptoms.  - Will reinitiate hemodialysis treatment. Start outpatient planning.  Orders prepared to start hemodialysis later today.   2. Hypertension: blood pressure now at goal. Hypotensive on admission.  - hold home blood pressure medications - Cultures pe- send for AFB since she has history of AFB exit site infection - Empiric  antibiotics.   3. Anemia: normocytic 8 Has missed her procrit for over 2 months - Start EPO with HD treatments.   4. Secondary Hyperparathyroidism: with hypocalcemia. Consistent with inadequate dialysis  - Check PTH and phosphorus.  - calcium acetate with meals.    LOS: 0 Jacqueline Sermon 8/29/20194:10 PM

## 2017-09-08 NOTE — Progress Notes (Signed)
Post HD assessment    09/08/17 2112  Neurological  Level of Consciousness Alert  Orientation Level Oriented X4  Respiratory  Respiratory Pattern Regular;Unlabored  Chest Assessment Chest expansion symmetrical  Cardiac  ECG Monitor Yes  Cardiac Rhythm NSR  Vascular  R Radial Pulse +2  L Radial Pulse +2  Integumentary  Integumentary (WDL) X  Skin Color Pale  Musculoskeletal  Musculoskeletal (WDL) X  Generalized Weakness Yes  Assistive Device None  GU Assessment  Genitourinary (WDL) X  Genitourinary Symptoms  (HD)  Psychosocial  Psychosocial (WDL) WDL

## 2017-09-08 NOTE — Progress Notes (Signed)
Pre HD assessment    09/08/17 1839  Vital Signs  Temp 97.9 F (36.6 C)  Temp Source Axillary  Pulse Rate 72  Pulse Rate Source Monitor  Resp 12  BP 116/67  BP Location Right Arm  BP Method Automatic  Patient Position (if appropriate) Lying  Oxygen Therapy  SpO2 100 %  O2 Device Room Air  Pain Assessment  Pain Scale 0-10  Pain Score 0  Dialysis Weight  Weight 92.3 kg  Type of Weight Pre-Dialysis  Time-Out for Hemodialysis  What Procedure? HD   Pt Identifiers(min of two) First/Last Name;MRN/Account#  Correct Site? Yes  Correct Side? Yes  Correct Procedure? Yes  Consents Verified? Yes  Rad Studies Available? N/A  Safety Precautions Reviewed? Yes  Engineer, civil (consulting) Number  ([id number])  Station Number 1  UF/Alarm Test Passed  Conductivity: Meter 14  Conductivity: Machine  13.9  pH 7.6  Reverse Osmosis main  Normal Saline Lot Number [id number]  Dialyzer Lot Number [id number]  Disposable Set Lot Number [id number]  Machine Temperature 98.6 F (37 C)  Musician and Audible Yes  Blood Lines Intact and Secured Yes  Pre Treatment Patient Checks  Vascular access used during treatment Fistula  Hepatitis B Surface Antigen Results  (unk)  Date Hepatitis B Surface Antigen Drawn 09/08/17  Isolation Initiated Yes  Hepatitis B Surface Antibody  (unk)  Date Hepatitis B Surface Antibody Drawn 09/08/17  Hemodialysis Consent Verified Yes  Hemodialysis Standing Orders Initiated Yes  ECG (Telemetry) Monitor On Yes  Prime Ordered Normal Saline  Length of  DialysisTreatment -hour(s) 2.5 Hour(s)  Dialyzer Elisio 17H NR  Dialysate 3K, 2.5 Ca  Dialysis Anticoagulant None  Dialysate Flow Ordered 500  Blood Flow Rate Ordered 250 mL/min  Ultrafiltration Goal 0 Liters  Pre Treatment Labs Hepatitis B Surface Antigen;Other (Comment) (HBSAB, HBCAB, PTH)  Dialysis Blood Pressure Support Ordered Normal Saline  Education / Care Plan  Dialysis Education Provided Yes  Documented Education  in Care Plan Yes  Fistula / Graft Left Upper arm Arteriovenous fistula  Placement Date/Time: 10/01/15 1110   Placed prior to admission: No  Orientation: Left  Access Location: Upper arm  Access Type: Arteriovenous fistula  Site Condition No complications  Fistula / Graft Assessment Present;Thrill;Bruit  Drainage Description None

## 2017-09-09 ENCOUNTER — Ambulatory Visit: Payer: Medicare Other | Admitting: Internal Medicine

## 2017-09-09 DIAGNOSIS — N186 End stage renal disease: Secondary | ICD-10-CM | POA: Diagnosis not present

## 2017-09-09 DIAGNOSIS — E559 Vitamin D deficiency, unspecified: Secondary | ICD-10-CM | POA: Diagnosis not present

## 2017-09-09 DIAGNOSIS — Z992 Dependence on renal dialysis: Secondary | ICD-10-CM | POA: Diagnosis not present

## 2017-09-09 DIAGNOSIS — D509 Iron deficiency anemia, unspecified: Secondary | ICD-10-CM | POA: Diagnosis not present

## 2017-09-09 LAB — BASIC METABOLIC PANEL
ANION GAP: 6 (ref 5–15)
BUN: 34 mg/dL — ABNORMAL HIGH (ref 6–20)
CO2: 30 mmol/L (ref 22–32)
Calcium: 7.5 mg/dL — ABNORMAL LOW (ref 8.9–10.3)
Chloride: 101 mmol/L (ref 98–111)
Creatinine, Ser: 8.55 mg/dL — ABNORMAL HIGH (ref 0.44–1.00)
GFR calc non Af Amer: 5 mL/min — ABNORMAL LOW (ref >60)
GFR, EST AFRICAN AMERICAN: 5 mL/min — AB (ref >60)
Glucose, Bld: 131 mg/dL — ABNORMAL HIGH (ref 70–99)
Potassium: 3.8 mmol/L (ref 3.5–5.1)
SODIUM: 137 mmol/L (ref 135–145)

## 2017-09-09 LAB — PARATHYROID HORMONE, INTACT (NO CA): PTH: 108 pg/mL — ABNORMAL HIGH (ref 15–65)

## 2017-09-09 LAB — GLUCOSE, CAPILLARY
GLUCOSE-CAPILLARY: 117 mg/dL — AB (ref 70–99)
Glucose-Capillary: 110 mg/dL — ABNORMAL HIGH (ref 70–99)
Glucose-Capillary: 74 mg/dL (ref 70–99)

## 2017-09-09 NOTE — Plan of Care (Signed)
Patient will participate in some of her self care daily activities by discharge

## 2017-09-09 NOTE — Progress Notes (Addendum)
[facility] at [place] NAME: Jacqueline Rodriguez    MR#:  [id number]  DATE OF BIRTH:  [birth year]  SUBJECTIVE:  CHIEF COMPLAINT:   Chief Complaint  Patient presents with  . Hypotension   -Feels was much better today, no pain. -Dialysis first session done yesterday.  Unable to be completed due to.  Going for second session of dialysis today.  REVIEW OF SYSTEMS:  Review of Systems  Constitutional: Positive for malaise/fatigue. Negative for chills and fever.  HENT: Negative for congestion, ear discharge, hearing loss and nosebleeds.   Eyes: Negative for blurred vision and double vision.  Respiratory: Negative for cough, shortness of breath and wheezing.   Cardiovascular: Negative for chest pain, palpitations and leg swelling.  Gastrointestinal: Negative for abdominal pain, constipation, diarrhea, nausea and vomiting.  Genitourinary: Negative for dysuria.  Musculoskeletal: Negative for myalgias.  Neurological: Negative for dizziness, focal weakness, seizures, weakness and headaches.  Psychiatric/Behavioral: Negative for depression.    DRUG ALLERGIES:   Allergies  Allergen Reactions  . Ambien [Zolpidem] Other (See Comments)    hallucinations    VITALS:  Blood pressure (!) 154/52, pulse 79, temperature 97.8 F (36.6 C), temperature source Oral, resp. rate 10, height 5\' 7"  (1.702 m), weight 91.9 kg, last menstrual period 09/18/2013, SpO2 96 %.  PHYSICAL EXAMINATION:  Physical Exam  GENERAL:  57 y.o.-year-old patient lying in the bed with no acute distress.  EYES: Pupils equal, round, reactive to light and accommodation. No scleral icterus. Extraocular muscles intact.  HEENT: Head atraumatic, normocephalic. Oropharynx and nasopharynx clear.  NECK:  Supple, no jugular venous distention. No thyroid enlargement, no tenderness.  LUNGS: Normal breath sounds bilaterally, no wheezing, rales,rhonchi or crepitation. No use of accessory muscles of  respiration.  CARDIOVASCULAR: S1, S2 normal. No murmurs, rubs, or gallops.  ABDOMEN: Soft, nontender, nondistended.  PD catheter in place with minimal purulent discharge around it.  Bowel sounds present. No organomegaly or mass.  EXTREMITIES: No pedal edema, cyanosis, or clubbing.  Left arm AV fistula with good thrill noted. NEUROLOGIC: Cranial nerves II through XII are intact. Muscle strength 5/5 in all extremities. Sensation intact. Gait not checked.  PSYCHIATRIC: The patient is alert and oriented x 3.  SKIN: No obvious rash, lesion, or ulcer.    LABORATORY PANEL:   CBC Recent Labs  Lab 09/08/17 0430  WBC 11.4*  HGB 8.0*  HCT 24.0*  PLT 246   ------------------------------------------------------------------------------------------------------------------  Chemistries  Recent Labs  Lab 09/07/17 1221  09/09/17 0501  NA 136   < > 137  K 3.4*   < > 3.8  CL 95*   < > 101  CO2 28   < > 30  GLUCOSE 192*   < > 131*  BUN 39*   < > 34*  CREATININE 9.82*   < > 8.55*  CALCIUM 6.9*   < > 7.5*  AST 17  --   --   ALT 10  --   --   ALKPHOS 59  --   --   BILITOT 0.6  --   --    < > = values in this interval not displayed.   ------------------------------------------------------------------------------------------------------------------  Cardiac Enzymes No results for input(s): TROPONINI in the last 168 hours. ------------------------------------------------------------------------------------------------------------------  RADIOLOGY:  No results found.  EKG:   Orders placed or performed during the hospital encounter of 09/07/17  . EKG 12-Lead  . EKG 12-Lead    ASSESSMENT AND PLAN:   57 year old female  with past medical history significant for end-stage renal disease on peritoneal dialysis at home, COPD, hypertension, peripheral vascular disease, anemia and diabetes presents to hospital secondary to weakness and hypotension  1.  Hypotension- likely hypovolemia.  Sepsis  ruled out. -Cultures are negative.  Will discontinue antibiotics..  Received IV fluid resuscitation and improved blood pressure -Hold home blood pressure medications   2.  End-stage renal disease-has been on peritoneal dialysis for the past year. -Unable to be compliant with the PD.    restarted on hemodialysis this admission. Session 2 today - Will need 3 sessions of dialysis prior to discharge. -Appreciate nephrology consult.  Will need to have an outpatient dialysis chair set up prior to discharge.  3.  Diabetes mellitus-on glipizide, sliding scale insulin  4.  Depression-on Zoloft  5.  DVT prophylaxis-subcutaneous heparin  6. Anemia- chronic disease- epo with dialysis  Encourage ambulation while in the hospital    All the records are reviewed and case discussed with Care Management/Social Workerr. Management plans discussed with the patient, family and they are in agreement.  CODE STATUS: Full Code  TOTAL TIME TAKING CARE OF THIS PATIENT: 38 minutes.   POSSIBLE D/C IN 2-3 DAYS, DEPENDING ON CLINICAL CONDITION.   Kashmere Daywalt M.D on 09/09/2017 at 12:51 PM  Between 7am to 6pm - Pager - [phone]  After 6pm go to [email or web address] - password [email or web address] [facility]  Office  [phone]  CC: Primary care physician; Hortencia Pilar, MD

## 2017-09-09 NOTE — Progress Notes (Signed)
This note also relates to the following rows which could not be included: Pulse Rate - Cannot attach notes to unvalidated device data Resp - Cannot attach notes to unvalidated device data BP - Cannot attach notes to unvalidated device data  Hd completed  

## 2017-09-09 NOTE — Progress Notes (Signed)
This note also relates to the following rows which could not be included: Pulse Rate - Cannot attach notes to unvalidated device data Resp - Cannot attach notes to unvalidated device data BP - Cannot attach notes to unvalidated device data  Hd started  

## 2017-09-09 NOTE — Progress Notes (Signed)
[facility]  ROUNDING NOTE   Subjective:   Seen and examined on hemodialysis. Tolerating treatment well. Second hemodialysis treatment since transitioning from peritoneal dialysis.   Patient has limited treatment yesterday, AVF infiltrated last night.   Today no issues, with hemodialysis.     HEMODIALYSIS FLOWSHEET:  Blood Flow Rate (mL/min): 300 mL/min Arterial Pressure (mmHg): -190 mmHg Venous Pressure (mmHg): 150 mmHg Transmembrane Pressure (mmHg): 50 mmHg Ultrafiltration Rate (mL/min): 330 mL/min Dialysate Flow Rate (mL/min): 600 ml/min Conductivity: Machine : 13.9 Conductivity: Machine : 13.9 Dialysis Fluid Bolus: Normal Saline Bolus Amount (mL): 250 mL    Objective:  Vital signs in last 24 hours:  Temp:  [97.6 F (36.4 C)-98.8 F (37.1 C)] 97.8 F (36.6 C) (08/30 1145) Pulse Rate:  [67-109] 68 (08/30 1230) Resp:  [10-25] 16 (08/30 1230) BP: (100-155)/(44-110) 138/64 (08/30 1230) SpO2:  [95 %-100 %] 96 % (08/30 0405) Weight:  [91.9 kg-92.3 kg] 91.9 kg (08/29 2045)  Weight change: -3.409 kg Filed Weights   09/07/17 1216 09/08/17 1839 09/08/17 2045  Weight: 95.7 kg 92.3 kg 91.9 kg    Intake/Output: I/O last 3 completed shifts: In: 737.9 [P.O.:638; IV Piggyback:99.9] Out: -4    Intake/Output this shift:  No intake/output data recorded.  Physical Exam: General: NAD, laying in bed  Head: Normocephalic, atraumatic. Moist oral mucosal membranes  Eyes: Anicteric, PERRL  Neck: Supple, trachea midline  Lungs:  Clear to auscultation  Heart: Regular rate and rhythm  Abdomen:  Soft, nontender,   Extremities: no peripheral edema.  Neurologic: Nonfocal, moving all four extremities  Skin: No lesions  Access: PD catheter with crusting  Left AVF +thrill +bruit    Basic Metabolic Panel: Recent Labs  Lab 09/07/17 1221 09/08/17 0430 09/09/17 0501  NA 136 137 137  K 3.4* 3.4* 3.8  CL 95* 99 101  CO2 28 28 30   GLUCOSE 192* 73 131*  BUN 39* 41*  34*  CREATININE 9.82* 10.16* 8.55*  CALCIUM 6.9* 6.9* 7.5*    Liver Function Tests: Recent Labs  Lab 09/07/17 1221  AST 17  ALT 10  ALKPHOS 59  BILITOT 0.6  PROT 5.4*  ALBUMIN 2.2*   No results for input(s): LIPASE, AMYLASE in the last 168 hours. No results for input(s): AMMONIA in the last 168 hours.  CBC: Recent Labs  Lab 09/07/17 1221 09/08/17 0430  WBC 13.4* 11.4*  NEUTROABS 11.1*  --   HGB 8.9* 8.0*  HCT 26.9* 24.0*  MCV 87.6 87.7  PLT 280 246    Cardiac Enzymes: No results for input(s): CKTOTAL, CKMB, CKMBINDEX, TROPONINI in the last 168 hours.  BNP: Invalid input(s): POCBNP  CBG: Recent Labs  Lab 09/08/17 0832 09/08/17 1133 09/08/17 1609 09/08/17 2141 09/09/17 0731  GLUCAP 79 103* 85 85 110*    Microbiology: Results for orders placed or performed during the hospital encounter of 09/07/17  Blood culture (routine x 2)     Status: None (Preliminary result)   Collection Time: 09/07/17  1:11 PM  Result Value Ref Range Status   Specimen Description BLOOD  Final   Special Requests NONE  Final   Culture   Final    NO GROWTH 2 DAYS Performed at [facility], [address] [place], [place] [address]    Report Status PENDING  Incomplete  Blood culture (routine x 2)     Status: None (Preliminary result)   Collection Time: 09/07/17  1:11 PM  Result Value Ref Range Status   Specimen Description  BLOOD RIGHT WRIST  Final   Special Requests   Final    BOTTLES DRAWN AEROBIC AND ANAEROBIC Blood Culture adequate volume   Culture   Final    NO GROWTH 2 DAYS Performed at [facility], [address] [place], [place] [address]    Report Status PENDING  Incomplete  Body fluid culture     Status: None (Preliminary result)   Collection Time: 09/07/17  4:38 PM  Result Value Ref Range Status   Specimen Description   Final    PERITONEAL CAVITY Performed at [facility], [address] [place], [place] [address]    Special  Requests   Final    NONE Performed at [facility], [address] [place], [place] [address]    Gram Stain   Final    WBC PRESENT,BOTH PMN AND MONONUCLEAR NO ORGANISMS SEEN CYTOSPIN SMEAR    Culture   Final    NO GROWTH 2 DAYS Performed at [facility], [place] [address] [place], [place] [address]    Report Status PENDING  Incomplete  MRSA PCR Screening     Status: None   Collection Time: 09/07/17  6:30 PM  Result Value Ref Range Status   MRSA by PCR NEGATIVE NEGATIVE Final    Comment:        The GeneXpert MRSA Assay (FDA approved for NASAL specimens only), is one component of a comprehensive MRSA colonization surveillance program. It is not intended to diagnose MRSA infection nor to guide or monitor treatment for MRSA infections. Performed at [facility], [address] [place], [place] [address]     Coagulation Studies: No results for input(s): LABPROT, INR in the last 72 hours.  Urinalysis: No results for input(s): COLORURINE, LABSPEC, PHURINE, GLUCOSEU, HGBUR, BILIRUBINUR, KETONESUR, PROTEINUR, UROBILINOGEN, NITRITE, LEUKOCYTESUR in the last 72 hours.  Invalid input(s): APPERANCEUR    Imaging: No results found.   Medications:   . ceFEPime (MAXIPIME) IV 1 g (09/09/17 1034)   . aspirin EC  162 mg Oral QHS  . calcium acetate  2,001 mg Oral TID WC  . epoetin (EPOGEN/PROCRIT) injection  10,000 Units Intravenous Q M,W,F-HD  . glipiZIDE  5 mg Oral Daily  . heparin  5,000 Units Subcutaneous Q8H  . insulin aspart  0-5 Units Subcutaneous QHS  . insulin aspart  0-9 Units Subcutaneous TID WC  . lactulose  20 g Oral QODAY  . sertraline  100 mg Oral Daily  . sevelamer carbonate  1,600 mg Oral TID WC  . tuberculin  5 Units Intradermal Once  . vancomycin variable dose per unstable renal function (pharmacist dosing)   Does not apply See admin instructions     Assessment/ Plan:  [name] is a 57 y.o. white female with end stage  renal disease on peritoneal dialysis, hypertension, diabetes mellitus type II, diabetic retinopathy, diabetic neuropathy, COPD, pulmonary hypertension, peripheral vascular disease  [facility] Peritoneal dialysis EDW 95kg CCPD 5 cycles 3 litres with 1 liter last fill  1. End Stage Renal Disease on peritoneal dialysis. Patient showing noncompliance and is not performing her dialysis at home. Now with uremic symptoms. Transition to hemodialysis. Tolerating second hemodialysis treatment - Outpatient planning.  - Clean PD catheter exit site regularly with gentamicin.   2. Hypertension: blood pressure at goal.   - holding home blood pressure medications - Cultures pending: also sending for AFB since she has history of AFB exit site infection - Empiric antibiotics Cefepime and  vancomycin.    3. Anemia: normocytic 8 Has missed her procrit for over 2 months - EPO with HD treatments.   4. Secondary Hyperparathyroidism: with hypocalcemia. Consistent with inadequate dialysis  PTH low at 108 - calcium acetate with meals.    LOS: 1 Keithan Dileonardo 8/30/201912:44 PM

## 2017-09-09 NOTE — Care Management (Addendum)
PPD was placed on 09/08/17 at 1803 per MAR. Results pending. [name] with [facility] [employer] following for outpatient hemodialysis chair. At [place], [place], [place].

## 2017-09-09 NOTE — Progress Notes (Signed)
PD exit site dressing change performed.  Small amount of thick yellowish drainage visualized.  Patient denies any pain at sight.  Cleaned exit site and dried sterile gauze pad used to cover sight and secured with tape.

## 2017-09-10 DIAGNOSIS — Z992 Dependence on renal dialysis: Secondary | ICD-10-CM | POA: Diagnosis not present

## 2017-09-10 DIAGNOSIS — N186 End stage renal disease: Secondary | ICD-10-CM | POA: Diagnosis not present

## 2017-09-10 DIAGNOSIS — E559 Vitamin D deficiency, unspecified: Secondary | ICD-10-CM | POA: Diagnosis not present

## 2017-09-10 DIAGNOSIS — D509 Iron deficiency anemia, unspecified: Secondary | ICD-10-CM | POA: Diagnosis not present

## 2017-09-10 LAB — GLUCOSE, CAPILLARY
GLUCOSE-CAPILLARY: 67 mg/dL — AB (ref 70–99)
GLUCOSE-CAPILLARY: 111 mg/dL — AB (ref 70–99)
Glucose-Capillary: 126 mg/dL — ABNORMAL HIGH (ref 70–99)
Glucose-Capillary: 125 mg/dL — ABNORMAL HIGH (ref 70–99)

## 2017-09-10 LAB — HEPATITIS B CORE ANTIBODY, IGM: Hep B C IgM: NEGATIVE

## 2017-09-10 LAB — HEPATITIS B SURFACE ANTIBODY,QUALITATIVE: Hep B S Ab: NONREACTIVE

## 2017-09-10 LAB — HEPATITIS C ANTIBODY: HCV Ab: <0.1 s/co ratio (ref 0.0–0.9)

## 2017-09-10 LAB — HEPATITIS B SURFACE ANTIGEN: HEP B S AG: NEGATIVE

## 2017-09-10 MED ORDER — GENTAMICIN SULFATE 0.1 % EX OINT
TOPICAL_OINTMENT | Freq: Three times a day (TID) | CUTANEOUS | Status: DC
  Administered 2017-09-10 (×2): via TOPICAL
  Filled 2017-09-10: qty 15

## 2017-09-10 MED ORDER — CHLORHEXIDINE GLUCONATE CLOTH 2 % EX PADS
6.0000 | MEDICATED_PAD | Freq: Every day | CUTANEOUS | Status: DC
  Administered 2017-09-10: 6 via TOPICAL

## 2017-09-10 MED ORDER — GENTAMICIN SULFATE 0.1 % EX OINT: TOPICAL_OINTMENT | Freq: Three times a day (TID) | CUTANEOUS | 0 refills | Status: DC

## 2017-09-10 MED ORDER — EPOETIN ALFA 10000 UNIT/ML IJ SOLN
10000.0000 [IU] | INTRAMUSCULAR | Status: DC
  Administered 2017-09-10: 10000 [IU] via INTRAVENOUS

## 2017-09-10 MED ORDER — CALCIUM ACETATE (PHOS BINDER) 667 MG PO CAPS: 2001.0000 mg | ORAL_CAPSULE | Freq: Three times a day (TID) | ORAL | 0 refills | Status: DC

## 2017-09-10 NOTE — Progress Notes (Signed)
PPD was negative.

## 2017-09-10 NOTE — Progress Notes (Signed)
Hypoglycemic Event  CBG: 67 at 1713  Treatment: 1/2 c of sprite  Symptoms: asymptomatic  Follow-up CBG: Time:1747 CBG Result:111  Possible Reasons for Event: Pt didn't eat breakfast or lunch due to nausea. Was in dialysis.   Comments/MD notified: No    Noelle Penner

## 2017-09-10 NOTE — Progress Notes (Signed)
[facility]  ROUNDING NOTE   Subjective:   Seen and examined on third hemodialysis treatment.   Some nausea overnight.     HEMODIALYSIS FLOWSHEET:  Blood Flow Rate (mL/min): 350 mL/min Arterial Pressure (mmHg): -220 mmHg Venous Pressure (mmHg): 160 mmHg Transmembrane Pressure (mmHg): 60 mmHg Ultrafiltration Rate (mL/min): 430 mL/min Dialysate Flow Rate (mL/min): 600 ml/min Conductivity: Machine : 14 Conductivity: Machine : 14 Dialysis Fluid Bolus: Normal Saline Bolus Amount (mL): 250 mL    Objective:  Vital signs in last 24 hours:  Temp:  [97.8 F (36.6 C)-98.2 F (36.8 C)] (P) 97.8 F (36.6 C) (08/31 1300) Pulse Rate:  [71-94] 94 (08/31 0531) Resp:  [11-22] 20 (08/31 0531) BP: (115-175)/(48-82) 171/82 (08/31 0531) SpO2:  [98 %-100 %] 100 % (08/31 0531)  Weight change:  Filed Weights   09/07/17 1216 09/08/17 1839 09/08/17 2045  Weight: 95.7 kg 92.3 kg 91.9 kg    Intake/Output: I/O last 3 completed shifts: In: 150 [P.O.:150] Out: 496 [Other:496]   Intake/Output this shift:  No intake/output data recorded.  Physical Exam: General: NAD, laying in bed  Head: Normocephalic, atraumatic. Moist oral mucosal membranes  Eyes: Anicteric, PERRL  Neck: Supple, trachea midline  Lungs:  Clear to auscultation  Heart: Regular rate and rhythm  Abdomen:  Soft, nontender,   Extremities: no peripheral edema.  Neurologic: Nonfocal, moving all four extremities  Skin: No lesions  Access: PD catheter with crusting  Left AVF +thrill +bruit    Basic Metabolic Panel: Recent Labs  Lab 09/07/17 1221 09/08/17 0430 09/09/17 0501  NA 136 137 137  K 3.4* 3.4* 3.8  CL 95* 99 101  CO2 28 28 30   GLUCOSE 192* 73 131*  BUN 39* 41* 34*  CREATININE 9.82* 10.16* 8.55*  CALCIUM 6.9* 6.9* 7.5*    Liver Function Tests: Recent Labs  Lab 09/07/17 1221  AST 17  ALT 10  ALKPHOS 59  BILITOT 0.6  PROT 5.4*  ALBUMIN 2.2*   No results for input(s): LIPASE, AMYLASE  in the last 168 hours. No results for input(s): AMMONIA in the last 168 hours.  CBC: Recent Labs  Lab 09/07/17 1221 09/08/17 0430  WBC 13.4* 11.4*  NEUTROABS 11.1*  --   HGB 8.9* 8.0*  HCT 26.9* 24.0*  MCV 87.6 87.7  PLT 280 246    Cardiac Enzymes: No results for input(s): CKTOTAL, CKMB, CKMBINDEX, TROPONINI in the last 168 hours.  BNP: Invalid input(s): POCBNP  CBG: Recent Labs  Lab 09/09/17 0731 09/09/17 1633 09/09/17 2127 09/10/17 0738 09/10/17 1142  GLUCAP 110* 74 117* 126* 125*    Microbiology: Results for orders placed or performed during the hospital encounter of 09/07/17  Blood culture (routine x 2)     Status: None (Preliminary result)   Collection Time: 09/07/17  1:11 PM  Result Value Ref Range Status   Specimen Description BLOOD  Final   Special Requests NONE  Final   Culture   Final    NO GROWTH 3 DAYS Performed at [facility], [address] [place], [place] [address]    Report Status PENDING  Incomplete  Blood culture (routine x 2)     Status: None (Preliminary result)   Collection Time: 09/07/17  1:11 PM  Result Value Ref Range Status   Specimen Description BLOOD RIGHT WRIST  Final   Special Requests   Final    BOTTLES DRAWN AEROBIC AND ANAEROBIC Blood Culture adequate volume   Culture   Final  NO GROWTH 3 DAYS Performed at [facility], [address] [place], [place] [address]    Report Status PENDING  Incomplete  Body fluid culture     Status: None (Preliminary result)   Collection Time: 09/07/17  4:38 PM  Result Value Ref Range Status   Specimen Description   Final    PERITONEAL CAVITY Performed at [facility], [address] [place], [place] [address]    Special Requests   Final    NONE Performed at [facility], [address] [place], [place] [address]    Gram Stain   Final    WBC PRESENT,BOTH PMN AND MONONUCLEAR NO ORGANISMS SEEN CYTOSPIN SMEAR    Culture   Final    NO  GROWTH 3 DAYS Performed at [facility], [place] [address], [place], [place] [address]    Report Status PENDING  Incomplete  MRSA PCR Screening     Status: None   Collection Time: 09/07/17  6:30 PM  Result Value Ref Range Status   MRSA by PCR NEGATIVE NEGATIVE Final    Comment:        The GeneXpert MRSA Assay (FDA approved for NASAL specimens only), is one component of a comprehensive MRSA colonization surveillance program. It is not intended to diagnose MRSA infection nor to guide or monitor treatment for MRSA infections. Performed at [facility], [address] [place], [place] [address]     Coagulation Studies: No results for input(s): LABPROT, INR in the last 72 hours.  Urinalysis: No results for input(s): COLORURINE, LABSPEC, PHURINE, GLUCOSEU, HGBUR, BILIRUBINUR, KETONESUR, PROTEINUR, UROBILINOGEN, NITRITE, LEUKOCYTESUR in the last 72 hours.  Invalid input(s): APPERANCEUR    Imaging: No results found.   Medications:    . aspirin EC  162 mg Oral QHS  . calcium acetate  2,001 mg Oral TID WC  . Chlorhexidine Gluconate Cloth  6 each Topical Q0600  . epoetin (EPOGEN/PROCRIT) injection  10,000 Units Intravenous Q T,Th,Sa-HD  . gentamicin ointment   Topical TID  . glipiZIDE  5 mg Oral Daily  . heparin  5,000 Units Subcutaneous Q8H  . insulin aspart  0-5 Units Subcutaneous QHS  . insulin aspart  0-9 Units Subcutaneous TID WC  . lactulose  20 g Oral QODAY  . sertraline  100 mg Oral Daily  . sevelamer carbonate  1,600 mg Oral TID WC  . tuberculin  5 Units Intradermal Once     Assessment/ Plan:  Ms. PEGEEN STIGER is a 57 y.o. white female with end stage renal disease on peritoneal dialysis, hypertension, diabetes mellitus type II, diabetic retinopathy, diabetic neuropathy, COPD, pulmonary hypertension, peripheral vascular disease  [facility] Transitioned to hemodialysis. TTS [facility]. Left AVF EDW 91.5kg     1. End Stage Renal Disease on  peritoneal dialysis. Patient showing noncompliance and is not performing her dialysis at home Transitioned to hemodialysis. Third treatment today. Tolerating third treatment well.  - Using 16 ga needles BFR 300 - Tolerating second hemodialysis treatment - Clean PD catheter exit site regularly with gentamicin.   2. Hypertension: blood pressure at goal.   - holding home blood pressure medications - Cultures pending: also sending for AFB since she has history of AFB exit site infection - No indication for continuing antibiotics.     3. Anemia: normocytic hemoglobin  Of 8  Has missed her procrit for over 2 months - EPO with HD treatments. TTS  4. Secondary Hyperparathyroidism: with hypocalcemia. Consistent with  inadequate dialysis  PTH low at 108 - calcium acetate with meals.    LOS: 2 Jacqueline Rodriguez 8/31/20191:11 PM

## 2017-09-10 NOTE — Progress Notes (Signed)
Hd completed 

## 2017-09-10 NOTE — Progress Notes (Signed)
This note also relates to the following rows which could not be included: Pulse Rate - Cannot attach notes to unvalidated device data BP - Cannot attach notes to unvalidated device data  Hd started

## 2017-09-10 NOTE — Discharge Summary (Signed)
[facility] NAME: Jacqueline Rodriguez    MR#:  [id number]  DATE OF BIRTH:  July 10, 1960  DATE OF ADMISSION:  09/07/2017   ADMITTING PHYSICIAN: Henreitta Leber, MD  DATE OF DISCHARGE:  09/10/17  PRIMARY CARE PHYSICIAN: Hortencia Pilar, MD   ADMISSION DIAGNOSIS:   Hypocalcemia [E83.51] Weakness [R53.1] Hypotension, unspecified hypotension type [I95.9]  DISCHARGE DIAGNOSIS:   Active Problems:   Hypotension   SECONDARY DIAGNOSIS:   Past Medical History:  Diagnosis Date  . Anemia   . Chronic bronchitis ([place])   . Chronic diastolic CHF (congestive heart failure) ([place])   . CKD (chronic kidney disease), stage III ([place])   . COPD (chronic obstructive pulmonary disease) ([place])   . Diabetes mellitus with renal complications ([place])   . Essential hypertension   . GERD (gastroesophageal reflux disease)   . Obesity   . Peripheral vascular disease ([place])   . Pulmonary hypertension ([place])   . Renal insufficiency 09/19/2015   Stage 3 CKD. Beginning dialysis.    HOSPITAL COURSE:    57 year old female with past medical history significant for end-stage renal disease on peritoneal dialysis at home, COPD, hypertension, peripheral vascular disease, anemia and diabetes presents to hospital secondary to weakness and hypotension  1.  Hypotension- likely hypovolemia on admission.  Sepsis ruled out. -Cultures are negative.    Received IV antibiotics in the hospital which have been discontinued since cultures came back negative. -Blood pressure starting to get elevated again.  Restarted her home doses of Norvasc, clonidine and low-dose lisinopril. -Continue to hold Lasix and hydralazine   2.  End-stage renal disease-has been on peritoneal dialysis for the past year. -Unable to be compliant with the PD.  Due to uremic symptoms,   restarted on hemodialysis this admission. Session 3 today -Appreciate nephrology consult.    Outpatient dialysis chair set up  for Tuesday, Thursday and Saturday hemodialysis. -PPD 2 to be read this evening.  3.  Diabetes mellitus-on glipizide   4.  Depression-on Zoloft  5. Anemia- chronic disease- epo with dialysis  Ambulating well.  For discharge today   DISCHARGE CONDITIONS:   Guarded  CONSULTS OBTAINED:   Treatment Team:  Lavonia Dana, MD  DRUG ALLERGIES:   Allergies  Allergen Reactions  . Ambien [Zolpidem] Other (See Comments)    hallucinations   DISCHARGE MEDICATIONS:   Allergies as of 09/10/2017      Reactions   Ambien [zolpidem] Other (See Comments)   hallucinations      Medication List    STOP taking these medications   azithromycin 250 MG tablet Commonly known as:  ZITHROMAX   ferrous sulfate 325 (65 FE) MG EC tablet   furosemide 40 MG tablet Commonly known as:  LASIX   hydrALAZINE 10 MG tablet Commonly known as:  APRESOLINE   HYDROcodone-acetaminophen 5-325 MG tablet Commonly known as:  NORCO/VICODIN   magnesium oxide 400 (241.3 Mg) MG tablet Commonly known as:  MAG-OX     TAKE these medications   acetaminophen 500 MG tablet Commonly known as:  TYLENOL Take 500 mg by mouth every 6 (six) hours as needed.   albuterol 108 (90 Base) MCG/ACT inhaler Commonly known as:  PROVENTIL HFA;VENTOLIN HFA Inhale 2 puffs into the lungs every 6 (six) hours as needed for wheezing or shortness of breath.   amLODipine 5 MG tablet Commonly known as:  NORVASC Take 5 mg by mouth daily.   aspirin 81 MG EC tablet Take 1  tablet (81 mg total) by mouth daily. What changed:    how much to take  when to take this   calcium acetate 667 MG capsule Commonly known as:  PHOSLO Take 3 capsules (2,001 mg total) by mouth 3 (three) times daily with meals.   cetirizine 10 MG tablet Commonly known as:  ZYRTEC Take 10 mg by mouth daily as needed for allergies.   cloNIDine 0.2 MG tablet Commonly known as:  CATAPRES Take 0.2 mg by mouth 2 (two) times daily.   gabapentin 100 MG  capsule Commonly known as:  NEURONTIN Take 100 mg by mouth 3 (three) times daily as needed. For neuropathy.   gentamicin ointment 0.1 % Commonly known as:  GARAMYCIN Apply topically 3 (three) times daily.   glipiZIDE 5 MG tablet Commonly known as:  GLUCOTROL Take 5 mg by mouth daily.   GLUCOSAMINE-CHONDROITIN PO Take 4,000 mg by mouth daily.   lactulose 10 GM/15ML solution Commonly known as:  CHRONULAC Take 30 mLs by mouth every other day.   lisinopril 10 MG tablet Commonly known as:  PRINIVIL,ZESTRIL Take 1 tablet by mouth daily.   meclizine 25 MG tablet Commonly known as:  ANTIVERT Take 1 tablet (25 mg total) by mouth 3 (three) times daily as needed for dizziness. What changed:  when to take this   ondansetron 4 MG tablet Commonly known as:  ZOFRAN Take 1 tablet (4 mg total) by mouth every 6 (six) hours as needed for nausea.   sertraline 100 MG tablet Commonly known as:  ZOLOFT Take 1 tablet by mouth daily.   sevelamer carbonate 800 MG tablet Commonly known as:  RENVELA Take 1,600 mg by mouth 3 (three) times daily with meals.        DISCHARGE INSTRUCTIONS:   1.  PCP follow-up in 1 to 2 weeks 2.  For dialysis as scheduled on 09/13/2017  DIET:   Renal diet  ACTIVITY:   Activity as tolerated  OXYGEN:   Home Oxygen: No.  Oxygen Delivery: room air  DISCHARGE LOCATION:   home   If you experience worsening of your admission symptoms, develop shortness of breath, life threatening emergency, suicidal or homicidal thoughts you must seek medical attention immediately by calling 911 or calling your MD immediately  if symptoms less severe.  You Must read complete instructions/literature along with all the possible adverse reactions/side effects for all the Medicines you take and that have been prescribed to you. Take any new Medicines after you have completely understood and accpet all the possible adverse reactions/side effects.   Please note  You were  cared for by a hospitalist during your hospital stay. If you have any questions about your discharge medications or the care you received while you were in the hospital after you are discharged, you can call the unit and asked to speak with the hospitalist on call if the hospitalist that took care of you is not available. Once you are discharged, your primary care physician will handle any further medical issues. Please note that NO REFILLS for any discharge medications will be authorized once you are discharged, as it is imperative that you return to your primary care physician (or establish a relationship with a primary care physician if you do not have one) for your aftercare needs so that they can reassess your need for medications and monitor your lab values.    On the day of Discharge:  VITAL SIGNS:   Blood pressure (!) 171/82, pulse 94, temperature 98.2 F (  36.8 C), temperature source Oral, resp. rate 20, height 5\' 7"  (1.702 m), weight 91.9 kg, last menstrual period 09/18/2013, SpO2 100 %.  PHYSICAL EXAMINATION:   GENERAL:  57 y.o.-year-old patient lying in the bed with no acute distress.  EYES: Pupils equal, round, reactive to light and accommodation. No scleral icterus. Extraocular muscles intact.  HEENT: Head atraumatic, normocephalic. Oropharynx and nasopharynx clear.  NECK:  Supple, no jugular venous distention. No thyroid enlargement, no tenderness.  LUNGS: Normal breath sounds bilaterally, no wheezing, rales,rhonchi or crepitation. No use of accessory muscles of respiration.  CARDIOVASCULAR: S1, S2 normal. No murmurs, rubs, or gallops.  ABDOMEN: Soft, nontender, nondistended.  PD catheter in place with minimal purulent discharge around it.  Bowel sounds present. No organomegaly or mass.  EXTREMITIES: No pedal edema, cyanosis, or clubbing.  Left arm AV fistula with good thrill noted. NEUROLOGIC: Cranial nerves II through XII are intact. Muscle strength 5/5 in all extremities.  Sensation intact. Gait not checked.  PSYCHIATRIC: The patient is alert and oriented x 3.  SKIN: No obvious rash, lesion, or ulcer.     DATA REVIEW:   CBC Recent Labs  Lab 09/08/17 0430  WBC 11.4*  HGB 8.0*  HCT 24.0*  PLT 246    Chemistries  Recent Labs  Lab 09/07/17 1221  09/09/17 0501  NA 136   < > 137  K 3.4*   < > 3.8  CL 95*   < > 101  CO2 28   < > 30  GLUCOSE 192*   < > 131*  BUN 39*   < > 34*  CREATININE 9.82*   < > 8.55*  CALCIUM 6.9*   < > 7.5*  AST 17  --   --   ALT 10  --   --   ALKPHOS 59  --   --   BILITOT 0.6  --   --    < > = values in this interval not displayed.     Microbiology Results  Results for orders placed or performed during the hospital encounter of 09/07/17  Blood culture (routine x 2)     Status: None (Preliminary result)   Collection Time: 09/07/17  1:11 PM  Result Value Ref Range Status   Specimen Description BLOOD  Final   Special Requests NONE  Final   Culture   Final    NO GROWTH 3 DAYS Performed at [facility], [address] [place], [place] [address]    Report Status PENDING  Incomplete  Blood culture (routine x 2)     Status: None (Preliminary result)   Collection Time: 09/07/17  1:11 PM  Result Value Ref Range Status   Specimen Description BLOOD RIGHT WRIST  Final   Special Requests   Final    BOTTLES DRAWN AEROBIC AND ANAEROBIC Blood Culture adequate volume   Culture   Final    NO GROWTH 3 DAYS Performed at [facility], [address], [place], [place] [address]    Report Status PENDING  Incomplete  Body fluid culture     Status: None (Preliminary result)   Collection Time: 09/07/17  4:38 PM  Result Value Ref Range Status   Specimen Description   Final    PERITONEAL CAVITY Performed at [facility], [address] [place], [place] [address]    Special Requests   Final    NONE Performed at [facility], [address] [place], [place] [address]    Gram Stain  Final    WBC PRESENT,BOTH PMN AND MONONUCLEAR NO ORGANISMS SEEN CYTOSPIN SMEAR    Culture   Final    NO GROWTH 3 DAYS Performed at [facility], [place] [address], [place], [place] [address]    Report Status PENDING  Incomplete  MRSA PCR Screening     Status: None   Collection Time: 09/07/17  6:30 PM  Result Value Ref Range Status   MRSA by PCR NEGATIVE NEGATIVE Final    Comment:        The GeneXpert MRSA Assay (FDA approved for NASAL specimens only), is one component of a comprehensive MRSA colonization surveillance program. It is not intended to diagnose MRSA infection nor to guide or monitor treatment for MRSA infections. Performed at [facility], [address], [place], [place] [address]     RADIOLOGY:  No results found.   Management plans discussed with the patient, family and they are in agreement.  CODE STATUS:     Code Status Orders  (From admission, onward)         Start     Ordered   09/07/17 1814  Full code  Continuous     09/07/17 1813        Code Status History    Date Active Date Inactive Code Status Order ID Comments User Context   12/16/2016 0814 12/17/2016 1438 Full Code [id number]  Saundra Shelling, MD Inpatient   12/03/2016 1201 12/04/2016 1850 Full Code [id number]  Max Sane, MD Inpatient   09/13/2016 1629 09/17/2016 1817 Full Code [id number]  Epifanio Lesches, MD ED   09/08/2016 1631 09/09/2016 1823 Full Code [id number]  Fritzi Mandes, MD Inpatient   07/17/2015 1244 07/24/2015 1911 Full Code [id number]  Epifanio Lesches, MD ED   03/15/2015 1956 03/20/2015 1548 Full Code [id number]  Idelle Crouch, MD Inpatient      TOTAL TIME TAKING CARE OF THIS PATIENT: 38 minutes.    Gladstone Lighter M.D on 09/10/2017 at 12:14 PM  Between 7am to 6pm - Pager - [phone]  After 6pm go to [email or web address] - Proofreader  [facility]  Office  [phone]  CC: Primary care physician; Hortencia Pilar,  MD   Note: This dictation was prepared with Dragon dictation along with smaller phrase technology. Any transcriptional errors that result from this process are unintentional.

## 2017-09-11 LAB — BODY FLUID CULTURE: Culture: NO GROWTH

## 2017-09-12 LAB — CULTURE, BLOOD (ROUTINE X 2)
Culture: NO GROWTH
Culture: NO GROWTH
Special Requests: ADEQUATE

## 2017-09-13 DIAGNOSIS — N2581 Secondary hyperparathyroidism of renal origin: Secondary | ICD-10-CM | POA: Diagnosis not present

## 2017-09-13 DIAGNOSIS — N186 End stage renal disease: Secondary | ICD-10-CM | POA: Diagnosis not present

## 2017-09-13 DIAGNOSIS — D509 Iron deficiency anemia, unspecified: Secondary | ICD-10-CM | POA: Diagnosis not present

## 2017-09-13 DIAGNOSIS — D631 Anemia in chronic kidney disease: Secondary | ICD-10-CM | POA: Diagnosis not present

## 2017-09-13 DIAGNOSIS — Z992 Dependence on renal dialysis: Secondary | ICD-10-CM | POA: Diagnosis not present

## 2017-09-13 LAB — ACID FAST SMEAR (AFB): ACID FAST SMEAR - AFSCU2: NEGATIVE

## 2017-09-13 LAB — ACID FAST SMEAR (AFB, MYCOBACTERIA)

## 2017-09-15 DIAGNOSIS — D509 Iron deficiency anemia, unspecified: Secondary | ICD-10-CM | POA: Diagnosis not present

## 2017-09-15 DIAGNOSIS — N2581 Secondary hyperparathyroidism of renal origin: Secondary | ICD-10-CM | POA: Diagnosis not present

## 2017-09-15 DIAGNOSIS — Z992 Dependence on renal dialysis: Secondary | ICD-10-CM | POA: Diagnosis not present

## 2017-09-15 DIAGNOSIS — D631 Anemia in chronic kidney disease: Secondary | ICD-10-CM | POA: Diagnosis not present

## 2017-09-15 DIAGNOSIS — N186 End stage renal disease: Secondary | ICD-10-CM | POA: Diagnosis not present

## 2017-09-17 DIAGNOSIS — N186 End stage renal disease: Secondary | ICD-10-CM | POA: Diagnosis not present

## 2017-09-17 DIAGNOSIS — N2581 Secondary hyperparathyroidism of renal origin: Secondary | ICD-10-CM | POA: Diagnosis not present

## 2017-09-17 DIAGNOSIS — Z992 Dependence on renal dialysis: Secondary | ICD-10-CM | POA: Diagnosis not present

## 2017-09-17 DIAGNOSIS — D509 Iron deficiency anemia, unspecified: Secondary | ICD-10-CM | POA: Diagnosis not present

## 2017-09-17 DIAGNOSIS — D631 Anemia in chronic kidney disease: Secondary | ICD-10-CM | POA: Diagnosis not present

## 2017-09-20 DIAGNOSIS — N2581 Secondary hyperparathyroidism of renal origin: Secondary | ICD-10-CM | POA: Diagnosis not present

## 2017-09-20 DIAGNOSIS — D631 Anemia in chronic kidney disease: Secondary | ICD-10-CM | POA: Diagnosis not present

## 2017-09-20 DIAGNOSIS — D509 Iron deficiency anemia, unspecified: Secondary | ICD-10-CM | POA: Diagnosis not present

## 2017-09-20 DIAGNOSIS — N186 End stage renal disease: Secondary | ICD-10-CM | POA: Diagnosis not present

## 2017-09-20 DIAGNOSIS — Z992 Dependence on renal dialysis: Secondary | ICD-10-CM | POA: Diagnosis not present

## 2017-09-22 DIAGNOSIS — D509 Iron deficiency anemia, unspecified: Secondary | ICD-10-CM | POA: Diagnosis not present

## 2017-09-22 DIAGNOSIS — N2581 Secondary hyperparathyroidism of renal origin: Secondary | ICD-10-CM | POA: Diagnosis not present

## 2017-09-22 DIAGNOSIS — N186 End stage renal disease: Secondary | ICD-10-CM | POA: Diagnosis not present

## 2017-09-22 DIAGNOSIS — Z992 Dependence on renal dialysis: Secondary | ICD-10-CM | POA: Diagnosis not present

## 2017-09-22 DIAGNOSIS — D631 Anemia in chronic kidney disease: Secondary | ICD-10-CM | POA: Diagnosis not present

## 2017-09-27 DIAGNOSIS — N2581 Secondary hyperparathyroidism of renal origin: Secondary | ICD-10-CM | POA: Diagnosis not present

## 2017-09-27 DIAGNOSIS — N186 End stage renal disease: Secondary | ICD-10-CM | POA: Diagnosis not present

## 2017-09-27 DIAGNOSIS — D509 Iron deficiency anemia, unspecified: Secondary | ICD-10-CM | POA: Diagnosis not present

## 2017-09-27 DIAGNOSIS — Z992 Dependence on renal dialysis: Secondary | ICD-10-CM | POA: Diagnosis not present

## 2017-09-27 DIAGNOSIS — D631 Anemia in chronic kidney disease: Secondary | ICD-10-CM | POA: Diagnosis not present

## 2017-09-29 DIAGNOSIS — D631 Anemia in chronic kidney disease: Secondary | ICD-10-CM | POA: Diagnosis not present

## 2017-09-29 DIAGNOSIS — N2581 Secondary hyperparathyroidism of renal origin: Secondary | ICD-10-CM | POA: Diagnosis not present

## 2017-09-29 DIAGNOSIS — Z992 Dependence on renal dialysis: Secondary | ICD-10-CM | POA: Diagnosis not present

## 2017-09-29 DIAGNOSIS — D509 Iron deficiency anemia, unspecified: Secondary | ICD-10-CM | POA: Diagnosis not present

## 2017-09-29 DIAGNOSIS — N186 End stage renal disease: Secondary | ICD-10-CM | POA: Diagnosis not present

## 2017-10-01 DIAGNOSIS — D631 Anemia in chronic kidney disease: Secondary | ICD-10-CM | POA: Diagnosis not present

## 2017-10-01 DIAGNOSIS — D509 Iron deficiency anemia, unspecified: Secondary | ICD-10-CM | POA: Diagnosis not present

## 2017-10-01 DIAGNOSIS — N186 End stage renal disease: Secondary | ICD-10-CM | POA: Diagnosis not present

## 2017-10-01 DIAGNOSIS — N2581 Secondary hyperparathyroidism of renal origin: Secondary | ICD-10-CM | POA: Diagnosis not present

## 2017-10-01 DIAGNOSIS — Z992 Dependence on renal dialysis: Secondary | ICD-10-CM | POA: Diagnosis not present

## 2017-10-05 DIAGNOSIS — D509 Iron deficiency anemia, unspecified: Secondary | ICD-10-CM | POA: Diagnosis not present

## 2017-10-05 DIAGNOSIS — N2581 Secondary hyperparathyroidism of renal origin: Secondary | ICD-10-CM | POA: Diagnosis not present

## 2017-10-05 DIAGNOSIS — N186 End stage renal disease: Secondary | ICD-10-CM | POA: Diagnosis not present

## 2017-10-05 DIAGNOSIS — D631 Anemia in chronic kidney disease: Secondary | ICD-10-CM | POA: Diagnosis not present

## 2017-10-05 DIAGNOSIS — Z992 Dependence on renal dialysis: Secondary | ICD-10-CM | POA: Diagnosis not present

## 2017-10-06 DIAGNOSIS — N2581 Secondary hyperparathyroidism of renal origin: Secondary | ICD-10-CM | POA: Diagnosis not present

## 2017-10-06 DIAGNOSIS — N186 End stage renal disease: Secondary | ICD-10-CM | POA: Diagnosis not present

## 2017-10-06 DIAGNOSIS — D509 Iron deficiency anemia, unspecified: Secondary | ICD-10-CM | POA: Diagnosis not present

## 2017-10-06 DIAGNOSIS — Z992 Dependence on renal dialysis: Secondary | ICD-10-CM | POA: Diagnosis not present

## 2017-10-06 DIAGNOSIS — D631 Anemia in chronic kidney disease: Secondary | ICD-10-CM | POA: Diagnosis not present

## 2017-10-08 DIAGNOSIS — D509 Iron deficiency anemia, unspecified: Secondary | ICD-10-CM | POA: Diagnosis not present

## 2017-10-08 DIAGNOSIS — N186 End stage renal disease: Secondary | ICD-10-CM | POA: Diagnosis not present

## 2017-10-08 DIAGNOSIS — Z992 Dependence on renal dialysis: Secondary | ICD-10-CM | POA: Diagnosis not present

## 2017-10-08 DIAGNOSIS — N2581 Secondary hyperparathyroidism of renal origin: Secondary | ICD-10-CM | POA: Diagnosis not present

## 2017-10-08 DIAGNOSIS — D631 Anemia in chronic kidney disease: Secondary | ICD-10-CM | POA: Diagnosis not present

## 2017-10-10 DIAGNOSIS — Z992 Dependence on renal dialysis: Secondary | ICD-10-CM | POA: Diagnosis not present

## 2017-10-10 DIAGNOSIS — N186 End stage renal disease: Secondary | ICD-10-CM | POA: Diagnosis not present

## 2017-10-13 DIAGNOSIS — D631 Anemia in chronic kidney disease: Secondary | ICD-10-CM | POA: Diagnosis not present

## 2017-10-13 DIAGNOSIS — D509 Iron deficiency anemia, unspecified: Secondary | ICD-10-CM | POA: Diagnosis not present

## 2017-10-13 DIAGNOSIS — N186 End stage renal disease: Secondary | ICD-10-CM | POA: Diagnosis not present

## 2017-10-13 DIAGNOSIS — Z992 Dependence on renal dialysis: Secondary | ICD-10-CM | POA: Diagnosis not present

## 2017-10-13 DIAGNOSIS — N2581 Secondary hyperparathyroidism of renal origin: Secondary | ICD-10-CM | POA: Diagnosis not present

## 2017-10-15 DIAGNOSIS — D509 Iron deficiency anemia, unspecified: Secondary | ICD-10-CM | POA: Diagnosis not present

## 2017-10-15 DIAGNOSIS — Z992 Dependence on renal dialysis: Secondary | ICD-10-CM | POA: Diagnosis not present

## 2017-10-15 DIAGNOSIS — N186 End stage renal disease: Secondary | ICD-10-CM | POA: Diagnosis not present

## 2017-10-15 DIAGNOSIS — D631 Anemia in chronic kidney disease: Secondary | ICD-10-CM | POA: Diagnosis not present

## 2017-10-15 DIAGNOSIS — N2581 Secondary hyperparathyroidism of renal origin: Secondary | ICD-10-CM | POA: Diagnosis not present

## 2017-10-18 DIAGNOSIS — N186 End stage renal disease: Secondary | ICD-10-CM | POA: Diagnosis not present

## 2017-10-18 DIAGNOSIS — D509 Iron deficiency anemia, unspecified: Secondary | ICD-10-CM | POA: Diagnosis not present

## 2017-10-18 DIAGNOSIS — Z992 Dependence on renal dialysis: Secondary | ICD-10-CM | POA: Diagnosis not present

## 2017-10-18 DIAGNOSIS — N2581 Secondary hyperparathyroidism of renal origin: Secondary | ICD-10-CM | POA: Diagnosis not present

## 2017-10-18 DIAGNOSIS — D631 Anemia in chronic kidney disease: Secondary | ICD-10-CM | POA: Diagnosis not present

## 2017-10-22 DIAGNOSIS — Z992 Dependence on renal dialysis: Secondary | ICD-10-CM | POA: Diagnosis not present

## 2017-10-22 DIAGNOSIS — D631 Anemia in chronic kidney disease: Secondary | ICD-10-CM | POA: Diagnosis not present

## 2017-10-22 DIAGNOSIS — N2581 Secondary hyperparathyroidism of renal origin: Secondary | ICD-10-CM | POA: Diagnosis not present

## 2017-10-22 DIAGNOSIS — N186 End stage renal disease: Secondary | ICD-10-CM | POA: Diagnosis not present

## 2017-10-22 DIAGNOSIS — D509 Iron deficiency anemia, unspecified: Secondary | ICD-10-CM | POA: Diagnosis not present

## 2017-10-24 ENCOUNTER — Encounter (INDEPENDENT_AMBULATORY_CARE_PROVIDER_SITE_OTHER): Payer: Self-pay

## 2017-10-25 DIAGNOSIS — N2581 Secondary hyperparathyroidism of renal origin: Secondary | ICD-10-CM | POA: Diagnosis not present

## 2017-10-25 DIAGNOSIS — N186 End stage renal disease: Secondary | ICD-10-CM | POA: Diagnosis not present

## 2017-10-25 DIAGNOSIS — Z992 Dependence on renal dialysis: Secondary | ICD-10-CM | POA: Diagnosis not present

## 2017-10-25 DIAGNOSIS — D509 Iron deficiency anemia, unspecified: Secondary | ICD-10-CM | POA: Diagnosis not present

## 2017-10-25 DIAGNOSIS — D631 Anemia in chronic kidney disease: Secondary | ICD-10-CM | POA: Diagnosis not present

## 2017-10-27 DIAGNOSIS — D509 Iron deficiency anemia, unspecified: Secondary | ICD-10-CM | POA: Diagnosis not present

## 2017-10-27 DIAGNOSIS — N2581 Secondary hyperparathyroidism of renal origin: Secondary | ICD-10-CM | POA: Diagnosis not present

## 2017-10-27 DIAGNOSIS — N186 End stage renal disease: Secondary | ICD-10-CM | POA: Diagnosis not present

## 2017-10-27 DIAGNOSIS — Z992 Dependence on renal dialysis: Secondary | ICD-10-CM | POA: Diagnosis not present

## 2017-10-27 DIAGNOSIS — D631 Anemia in chronic kidney disease: Secondary | ICD-10-CM | POA: Diagnosis not present

## 2017-11-07 ENCOUNTER — Other Ambulatory Visit (INDEPENDENT_AMBULATORY_CARE_PROVIDER_SITE_OTHER): Payer: Self-pay | Admitting: Nurse Practitioner

## 2017-11-07 ENCOUNTER — Encounter: Admission: RE | Payer: Self-pay | Source: Ambulatory Visit

## 2017-11-07 ENCOUNTER — Ambulatory Visit: Admission: RE | Admit: 2017-11-07 | Payer: Medicare Other | Source: Ambulatory Visit | Admitting: Vascular Surgery

## 2017-11-07 LAB — ACID FAST CULTURE WITH REFLEXED SENSITIVITIES (MYCOBACTERIA): Acid Fast Culture: NEGATIVE

## 2017-11-07 SURGERY — A/V FISTULAGRAM
Anesthesia: Moderate Sedation | Laterality: Left

## 2017-11-10 DIAGNOSIS — Z992 Dependence on renal dialysis: Secondary | ICD-10-CM | POA: Diagnosis not present

## 2017-11-10 DIAGNOSIS — N186 End stage renal disease: Secondary | ICD-10-CM | POA: Diagnosis not present

## 2017-11-26 ENCOUNTER — Other Ambulatory Visit: Payer: Self-pay

## 2017-11-26 ENCOUNTER — Emergency Department
Admission: EM | Admit: 2017-11-26 | Discharge: 2017-11-26 | Disposition: A | Discharge status: Home / Self Care | Payer: Medicare Other | Attending: Emergency Medicine | Admitting: Emergency Medicine

## 2017-11-26 DIAGNOSIS — E119 Type 2 diabetes mellitus without complications: Secondary | ICD-10-CM | POA: Diagnosis not present

## 2017-11-26 DIAGNOSIS — I120 Hypertensive chronic kidney disease with stage 5 chronic kidney disease or end stage renal disease: Secondary | ICD-10-CM | POA: Diagnosis not present

## 2017-11-26 DIAGNOSIS — Z0289 Encounter for other administrative examinations: Secondary | ICD-10-CM | POA: Diagnosis not present

## 2017-11-26 DIAGNOSIS — Z7982 Long term (current) use of aspirin: Secondary | ICD-10-CM | POA: Insufficient documentation

## 2017-11-26 DIAGNOSIS — Z992 Dependence on renal dialysis: Secondary | ICD-10-CM | POA: Diagnosis not present

## 2017-11-26 DIAGNOSIS — E1122 Type 2 diabetes mellitus with diabetic chronic kidney disease: Secondary | ICD-10-CM | POA: Diagnosis not present

## 2017-11-26 DIAGNOSIS — I132 Hypertensive heart and chronic kidney disease with heart failure and with stage 5 chronic kidney disease, or end stage renal disease: Secondary | ICD-10-CM | POA: Insufficient documentation

## 2017-11-26 DIAGNOSIS — N186 End stage renal disease: Secondary | ICD-10-CM | POA: Diagnosis not present

## 2017-11-26 DIAGNOSIS — Z79899 Other long term (current) drug therapy: Secondary | ICD-10-CM | POA: Insufficient documentation

## 2017-11-26 DIAGNOSIS — Z87891 Personal history of nicotine dependence: Secondary | ICD-10-CM | POA: Diagnosis not present

## 2017-11-26 DIAGNOSIS — J449 Chronic obstructive pulmonary disease, unspecified: Secondary | ICD-10-CM | POA: Insufficient documentation

## 2017-11-26 DIAGNOSIS — I5032 Chronic diastolic (congestive) heart failure: Secondary | ICD-10-CM | POA: Insufficient documentation

## 2017-11-26 NOTE — ED Provider Notes (Signed)
[facility] Emergency Department Provider Note  ____________________________________________  Time seen: Approximately 4:14 PM  I have reviewed the triage vital signs and the nursing notes.   HISTORY  Chief Complaint Referral    HPI Jacqueline Rodriguez is a 57 y.o. female who presents the emergency department requesting referral for nephrology.  Patient is a chronic kidney disease patient and end-stage renal failure on peritoneal dialysis.  Patient presents the emergency department requesting referral to nephrology as she has been reportedly discharged from nephrology practice she was currently seeing.  Patient reports that this was a "misunderstanding" where she was sick from 1 of her appointments, as well as missed 1 of her appointments.  Patient reports that she is on peritoneal dialysis at home, has 10 days worth of supplies left.  Patient was informed today that she was discharged from the nephrology practice and that she would need to seek further care.  Patient currently has no complaints.  She denies any weight gain, edema, chest pain, shortness of breath, abdominal pain.  She denies any dysuria, polyuria, hematuria.  She adamantly refuses any blood work, urinalysis or further work-up.  She states that she is here only for referral for new nephrology group.  I have discussed the patient with Dr. Holley Raring, who is on-call for [facility].  The patient is known to Dr. Holley Raring and he states that there has been discussion about discharging the patient.  The patient has missed over 20 visits in roughly the past 6 months.  Patient has been noncompliant with treatment options as well as noncompliant with follow-up.  While the patient is not a direct patient of Dr. Elwyn Lade and he does not know final disposition of the patient, he does verbalize that he was aware that patient was being discussed as possible discharge from his practice.  He advises that if the  patient is an emergent need of treatment, they will provide services in the emergency department/hospital if necessary, however at this time patient is likely discharge and may not follow-up with the practice.  He recommends giving patient further information regarding other nephrology practices.   Past Medical History:  Diagnosis Date  . Anemia   . Chronic bronchitis ([place])   . Chronic diastolic CHF (congestive heart failure) ([place])   . CKD (chronic kidney disease), stage III ([place])   . COPD (chronic obstructive pulmonary disease) ([place])   . Diabetes mellitus with renal complications ([place])   . Essential hypertension   . GERD (gastroesophageal reflux disease)   . Obesity   . Peripheral vascular disease ([place])   . Pulmonary hypertension ([place])   . Renal insufficiency 09/19/2015   Stage 3 CKD. Beginning dialysis.    Patient Active Problem List   Diagnosis Date Noted  . Hypotension 09/07/2017  . Peritoneal dialysis catheter exit site infection ([place]) 10/05/2016  . 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 Hypocalcemia 09/14/2016  . Hyperkalemia 09/13/2016  . Syncope 09/08/2016  . ESRD on dialysis ([place]) 05/25/2016  . Chronic diastolic heart failure ([place]) 07/30/2015  . Acute renal failure superimposed on stage 3 chronic kidney disease ([place]) 07/18/2015  . Diabetes mellitus with renal complications ([place])   . CKD (chronic kidney disease), stage III ([place])   . Pulmonary hypertension ([place])   . Obesity   . Morbid obesity due to excess calories ([place])   . Anemia in chronic renal disease   . Shortness of breath   . Back pain 05/01/2015  . Essential hypertension 03/25/2015  .  Diabetes ([place]) 03/25/2015    Past Surgical History:  Procedure Laterality Date  . A/V FISTULAGRAM N/A 09/16/2016   Procedure: A/V Fistulagram;  Surgeon: Algernon Huxley, MD;  Location: [facility];  Service: Cardiovascular;  Laterality: N/A;  . AV FISTULA PLACEMENT Left 10/01/2015   Procedure: ARTERIOVENOUS  (AV) FISTULA CREATION ( BRACHIAL CEPHALIC );  Surgeon: Algernon Huxley, MD;  Location: [facility];  Service: Vascular;  Laterality: Left;  . CAPD INSERTION N/A 06/10/2016   Procedure: LAPAROSCOPIC INSERTION CONTINUOUS AMBULATORY PERITONEAL DIALYSIS  (CAPD) CATHETER;  Surgeon: Algernon Huxley, MD;  Location: [facility];  Service: Vascular;  Laterality: N/A;  . DIALYSIS/PERMA CATHETER INSERTION  09/16/2016   Procedure: DIALYSIS/PERMA CATHETER INSERTION;  Surgeon: Algernon Huxley, MD;  Location: [facility];  Service: Cardiovascular;;  . DIALYSIS/PERMA CATHETER REMOVAL N/A 12/06/2016   Procedure: DIALYSIS/PERMA CATHETER REMOVAL;  Surgeon: Algernon Huxley, MD;  Location: [facility];  Service: Cardiovascular;  Laterality: N/A;  . PERIPHERAL VASCULAR CATHETERIZATION N/A 07/21/2015   Procedure: Dialysis/Perma Catheter;  Surgeon: Algernon Huxley, MD;  Location: [facility];  Service: Cardiovascular;  Laterality: N/A;  . PERIPHERAL VASCULAR CATHETERIZATION N/A 12/11/2015   Procedure: Dialysis/Perma Catheter Removal;  Surgeon: Algernon Huxley, MD;  Location: [facility];  Service: Cardiovascular;  Laterality: N/A;    Prior to Admission medications   Medication Sig Start Date End Date Taking? Authorizing Provider  acetaminophen (TYLENOL) 500 MG tablet Take 500 mg by mouth every 6 (six) hours as needed.    [provider]  albuterol (PROVENTIL HFA;VENTOLIN HFA) 108 (90 Base) MCG/ACT inhaler Inhale 2 puffs into the lungs every 6 (six) hours as needed for wheezing or shortness of breath. 01/08/15   Gregor Hams, MD  amLODipine (NORVASC) 5 MG tablet Take 5 mg by mouth daily. 06/22/17   [provider]  aspirin EC 81 MG EC tablet Take 1 tablet (81 mg total) by mouth daily. Patient taking differently: Take 162 mg by mouth at bedtime.  03/20/15   Vaughan Basta, MD  calcium acetate (PHOSLO) 667 MG capsule Take 3 capsules (2,001 mg total) by mouth 3 (three) times daily with  meals. 09/10/17   Gladstone Lighter, MD  cetirizine (ZYRTEC) 10 MG tablet Take 10 mg by mouth daily as needed for allergies.    [provider]  cloNIDine (CATAPRES) 0.2 MG tablet Take 0.2 mg by mouth 2 (two) times daily. 07/02/17   [provider]  gabapentin (NEURONTIN) 100 MG capsule Take 100 mg by mouth 3 (three) times daily as needed. For neuropathy. 03/05/15   [provider]  gentamicin ointment (GARAMYCIN) 0.1 % Apply topically 3 (three) times daily. 09/10/17   Gladstone Lighter, MD  glipiZIDE (GLUCOTROL) 5 MG tablet Take 5 mg by mouth daily.    [provider]  GLUCOSAMINE-CHONDROITIN PO Take 4,000 mg by mouth daily.    [provider]  lactulose (CHRONULAC) 10 GM/15ML solution Take 30 mLs by mouth every other day.  08/15/17   [provider]  lisinopril (PRINIVIL,ZESTRIL) 10 MG tablet Take 1 tablet by mouth daily. 08/28/17   [provider]  meclizine (ANTIVERT) 25 MG tablet Take 1 tablet (25 mg total) by mouth 3 (three) times daily as needed for dizziness. Patient taking differently: Take 25 mg by mouth daily.  12/08/15   Nance Pear, MD  ondansetron (ZOFRAN) 4 MG tablet Take 1 tablet (4 mg total) by mouth every 6 (six) hours  as needed for nausea. 09/17/16   Loletha Grayer, MD  sertraline (ZOLOFT) 100 MG tablet Take 1 tablet by mouth daily. 08/02/17   [provider]  sevelamer carbonate (RENVELA) 800 MG tablet Take 1,600 mg by mouth 3 (three) times daily with meals.    [provider]    Allergies Ambien [zolpidem]  Family History  Problem Relation Age of Onset  . Hypertension Mother   . Diabetes Mellitus II Mother   . CAD Father   . Hypertension Father   . Heart attack Father     Social History Social History   Tobacco Use  . Smoking status: Former Smoker    Packs/day: 0.50    Years: 5.00    Pack years: 2.50    Types: Cigarettes    Last attempt to quit: 03/24/1985    Years since quitting:  32.6  . Smokeless tobacco: Never Used  Substance Use Topics  . Alcohol use: No    Alcohol/week: 0.0 standard drinks  . Drug use: No     Review of Systems  Constitutional: No fever/chills.  No significant weight changes. Eyes: No visual changes. No discharge ENT: No upper respiratory complaints. Cardiovascular: no chest pain.  Denies peripheral edema. Respiratory: no cough. No SOB. Gastrointestinal: No abdominal pain.  No nausea, no vomiting.  No diarrhea.  No constipation. Genitourinary: Negative for dysuria. No hematuria Musculoskeletal: Negative for musculoskeletal pain. Skin: Negative for rash, abrasions, lacerations, ecchymosis. Neurological: Negative for headaches, focal weakness or numbness. 10-point ROS otherwise negative.  ____________________________________________   PHYSICAL EXAM:  VITAL SIGNS: ED Triage Vitals  Enc Vitals Group     BP 11/26/17 1511 125/65     Pulse Rate 11/26/17 1511 89     Resp 11/26/17 1511 16     Temp 11/26/17 1511 97.7 F (36.5 C)     Temp Source 11/26/17 1511 Oral     SpO2 11/26/17 1511 100 %     Weight 11/26/17 1513 185 lb (83.9 kg)     Height 11/26/17 1513 5\' 8"  (1.727 m)     Head Circumference --      Peak Flow --      Pain Score 11/26/17 1513 0     Pain Loc --      Pain Edu? --      Excl. in [place]? --      Constitutional: Alert and oriented. Well appearing and in no acute distress. Eyes: Conjunctivae are normal. PERRL. EOMI. Head: Atraumatic. ENT:      Ears:       Nose: No congestion/rhinnorhea.      Mouth/Throat: Mucous membranes are moist.  Neck: No stridor.    Cardiovascular: Normal rate, regular rhythm. Normal S1 and S2.  Good peripheral circulation.  No peripheral edema. Respiratory: Normal respiratory effort without tachypnea or retractions. Lungs CTAB. Good air entry to the bases with no decreased or absent breath sounds. Gastrointestinal: Bowel sounds 4 quadrants. Soft and nontender to palpation. No guarding or  rigidity. No palpable masses. No distention. No CVA tenderness. Musculoskeletal: Full range of motion to all extremities. No gross deformities appreciated. Neurologic:  Normal speech and language. No gross focal neurologic deficits are appreciated.  Skin:  Skin is warm, dry and intact. No rash noted. Psychiatric: Mood and affect are normal. Speech and behavior are normal. Patient exhibits appropriate insight and judgement.   ____________________________________________   LABS (all labs ordered are listed, but only abnormal results are displayed)  Labs Reviewed - No data to  display ____________________________________________  EKG   ____________________________________________  RADIOLOGY   No results found.  ____________________________________________    PROCEDURES  Procedure(s) performed:    Procedures    Medications - No data to display   ____________________________________________   INITIAL IMPRESSION / ASSESSMENT AND PLAN / ED COURSE  Pertinent labs & imaging results that were available during my care of the patient were reviewed by me and considered in my medical decision making (see chart for details).  Review of the [place] CSRS was performed in accordance of the [place] prior to dispensing any controlled drugs.      Patient's diagnosis is consistent with end-stage renal failure on peritoneal dialysis needing referral.  Patient presents the emergency department after being discharged from nephrology group.  Patient has missed over 20 appointments in the past 6 months.  Patient was discharged by nephrology group.  I spoke with the on-call physician for this group and he advises that at this time there is no further outpatient care that his practice will provide.  Patient is given information for [place] [facility] in [place], [facility] nephrology in [place], [place] [facility] in [place].  Patient is instructed to make contact with them to see if she can establish  care there.  Patient denied any complaints at this time and declines any work-up.  Establish with nephrology at 1 of the 3 available practices.  Patient is given ED precautions to return to the ED for any worsening or new symptoms.     ____________________________________________  FINAL CLINICAL IMPRESSION(S) / ED DIAGNOSES  Final diagnoses:  ESRD on peritoneal dialysis ([place])      NEW MEDICATIONS STARTED DURING THIS VISIT:  ED Discharge Orders    None          This chart was dictated using voice recognition software/Dragon. Despite best efforts to proofread, errors can occur which can change the meaning. Any change was purely unintentional.    Darletta Moll, PA-C 11/26/17 1718    Earleen Newport, MD 11/26/17 [phone]

## 2017-11-26 NOTE — ED Notes (Signed)
Per Dr. Cherylann Banas, pt ok to go to [facility].

## 2017-11-26 NOTE — ED Triage Notes (Signed)
Pt states Dr. Rolly Salter for "missing too many appointments". Family member states that she has been dropped by Dr. Jolaine Artist and needs to be evaluated and sent to a new doctor. Pt gives a list of reasons for missing appointments. Peritoneal dialysis pt, states does it every night. Denies swelling or weight gain.   A&O, no distress noted. In wheelchair.

## 2017-11-26 NOTE — ED Notes (Signed)
Per Dr. Cherylann Banas no blood work at this time.

## 2017-12-21 ENCOUNTER — Encounter: Payer: Self-pay | Admitting: Emergency Medicine

## 2017-12-21 ENCOUNTER — Emergency Department: Payer: Medicare Other

## 2017-12-21 ENCOUNTER — Other Ambulatory Visit: Payer: Self-pay

## 2017-12-21 ENCOUNTER — Inpatient Hospital Stay
Admission: EM | Admit: 2017-12-21 | Discharge: 2017-12-23 | DRG: 638 | Discharge status: Against Medical Advice | Payer: Medicare Other | Attending: Internal Medicine | Admitting: Internal Medicine

## 2017-12-21 DIAGNOSIS — I5032 Chronic diastolic (congestive) heart failure: Secondary | ICD-10-CM | POA: Diagnosis present

## 2017-12-21 DIAGNOSIS — R4182 Altered mental status, unspecified: Secondary | ICD-10-CM | POA: Diagnosis present

## 2017-12-21 DIAGNOSIS — E160 Drug-induced hypoglycemia without coma: Secondary | ICD-10-CM | POA: Diagnosis not present

## 2017-12-21 DIAGNOSIS — N2581 Secondary hyperparathyroidism of renal origin: Secondary | ICD-10-CM | POA: Diagnosis present

## 2017-12-21 DIAGNOSIS — Z992 Dependence on renal dialysis: Secondary | ICD-10-CM

## 2017-12-21 DIAGNOSIS — E162 Hypoglycemia, unspecified: Secondary | ICD-10-CM | POA: Diagnosis not present

## 2017-12-21 DIAGNOSIS — I272 Pulmonary hypertension, unspecified: Secondary | ICD-10-CM | POA: Diagnosis present

## 2017-12-21 DIAGNOSIS — Z833 Family history of diabetes mellitus: Secondary | ICD-10-CM

## 2017-12-21 DIAGNOSIS — K219 Gastro-esophageal reflux disease without esophagitis: Secondary | ICD-10-CM | POA: Diagnosis present

## 2017-12-21 DIAGNOSIS — E1122 Type 2 diabetes mellitus with diabetic chronic kidney disease: Secondary | ICD-10-CM | POA: Diagnosis present

## 2017-12-21 DIAGNOSIS — Z87891 Personal history of nicotine dependence: Secondary | ICD-10-CM

## 2017-12-21 DIAGNOSIS — I132 Hypertensive heart and chronic kidney disease with heart failure and with stage 5 chronic kidney disease, or end stage renal disease: Secondary | ICD-10-CM | POA: Diagnosis present

## 2017-12-21 DIAGNOSIS — E669 Obesity, unspecified: Secondary | ICD-10-CM | POA: Diagnosis present

## 2017-12-21 DIAGNOSIS — Z7982 Long term (current) use of aspirin: Secondary | ICD-10-CM

## 2017-12-21 DIAGNOSIS — Z888 Allergy status to other drugs, medicaments and biological substances status: Secondary | ICD-10-CM | POA: Diagnosis not present

## 2017-12-21 DIAGNOSIS — R404 Transient alteration of awareness: Secondary | ICD-10-CM | POA: Diagnosis not present

## 2017-12-21 DIAGNOSIS — R680 Hypothermia, not associated with low environmental temperature: Secondary | ICD-10-CM | POA: Diagnosis present

## 2017-12-21 DIAGNOSIS — E1151 Type 2 diabetes mellitus with diabetic peripheral angiopathy without gangrene: Secondary | ICD-10-CM | POA: Diagnosis present

## 2017-12-21 DIAGNOSIS — Z79899 Other long term (current) drug therapy: Secondary | ICD-10-CM | POA: Diagnosis not present

## 2017-12-21 DIAGNOSIS — R401 Stupor: Secondary | ICD-10-CM

## 2017-12-21 DIAGNOSIS — Z7984 Long term (current) use of oral hypoglycemic drugs: Secondary | ICD-10-CM | POA: Diagnosis not present

## 2017-12-21 DIAGNOSIS — E876 Hypokalemia: Secondary | ICD-10-CM | POA: Diagnosis present

## 2017-12-21 DIAGNOSIS — J449 Chronic obstructive pulmonary disease, unspecified: Secondary | ICD-10-CM | POA: Diagnosis present

## 2017-12-21 DIAGNOSIS — I120 Hypertensive chronic kidney disease with stage 5 chronic kidney disease or end stage renal disease: Secondary | ICD-10-CM | POA: Diagnosis not present

## 2017-12-21 DIAGNOSIS — R4020 Unspecified coma: Secondary | ICD-10-CM | POA: Diagnosis not present

## 2017-12-21 DIAGNOSIS — D631 Anemia in chronic kidney disease: Secondary | ICD-10-CM | POA: Diagnosis present

## 2017-12-21 DIAGNOSIS — E11649 Type 2 diabetes mellitus with hypoglycemia without coma: Principal | ICD-10-CM | POA: Diagnosis present

## 2017-12-21 DIAGNOSIS — E161 Other hypoglycemia: Secondary | ICD-10-CM | POA: Diagnosis not present

## 2017-12-21 DIAGNOSIS — N186 End stage renal disease: Secondary | ICD-10-CM

## 2017-12-21 DIAGNOSIS — E871 Hypo-osmolality and hyponatremia: Secondary | ICD-10-CM | POA: Diagnosis present

## 2017-12-21 DIAGNOSIS — Z9115 Patient's noncompliance with renal dialysis: Secondary | ICD-10-CM

## 2017-12-21 DIAGNOSIS — T383X5A Adverse effect of insulin and oral hypoglycemic [antidiabetic] drugs, initial encounter: Secondary | ICD-10-CM | POA: Diagnosis present

## 2017-12-21 DIAGNOSIS — G4489 Other headache syndrome: Secondary | ICD-10-CM | POA: Diagnosis not present

## 2017-12-21 DIAGNOSIS — Z8249 Family history of ischemic heart disease and other diseases of the circulatory system: Secondary | ICD-10-CM | POA: Diagnosis not present

## 2017-12-21 DIAGNOSIS — T68XXXA Hypothermia, initial encounter: Secondary | ICD-10-CM | POA: Diagnosis not present

## 2017-12-21 DIAGNOSIS — Z6828 Body mass index (BMI) 28.0-28.9, adult: Secondary | ICD-10-CM

## 2017-12-21 DIAGNOSIS — T383X1A Poisoning by insulin and oral hypoglycemic [antidiabetic] drugs, accidental (unintentional), initial encounter: Secondary | ICD-10-CM

## 2017-12-21 LAB — RENAL FUNCTION PANEL
Albumin: 2 g/dL — ABNORMAL LOW (ref 3.5–5.0)
Anion gap: 12 (ref 5–15)
BUN: 28 mg/dL — AB (ref 6–20)
CALCIUM: 6.8 mg/dL — AB (ref 8.9–10.3)
CO2: 24 mmol/L (ref 22–32)
Chloride: 95 mmol/L — ABNORMAL LOW (ref 98–111)
Creatinine, Ser: 7.13 mg/dL — ABNORMAL HIGH (ref 0.44–1.00)
GFR calc non Af Amer: 6 mL/min — ABNORMAL LOW (ref >60)
GFR, EST AFRICAN AMERICAN: 7 mL/min — AB (ref >60)
Glucose, Bld: 76 mg/dL (ref 70–99)
Phosphorus: 4.3 mg/dL (ref 2.5–4.6)
Potassium: 3.3 mmol/L — ABNORMAL LOW (ref 3.5–5.1)
Sodium: 131 mmol/L — ABNORMAL LOW (ref 135–145)

## 2017-12-21 LAB — GLUCOSE, CAPILLARY
GLUCOSE-CAPILLARY: 101 mg/dL — AB (ref 70–99)
GLUCOSE-CAPILLARY: 107 mg/dL — AB (ref 70–99)
GLUCOSE-CAPILLARY: 40 mg/dL — AB (ref 70–99)
GLUCOSE-CAPILLARY: 52 mg/dL — AB (ref 70–99)
GLUCOSE-CAPILLARY: 57 mg/dL — AB (ref 70–99)
GLUCOSE-CAPILLARY: 78 mg/dL (ref 70–99)
Glucose-Capillary: 89 mg/dL (ref 70–99)
Glucose-Capillary: 60 mg/dL — ABNORMAL LOW (ref 70–99)
Glucose-Capillary: 578 mg/dL (ref 70–99)
Glucose-Capillary: 27 mg/dL — CL (ref 70–99)
Glucose-Capillary: 61 mg/dL — ABNORMAL LOW (ref 70–99)
Glucose-Capillary: 65 mg/dL — ABNORMAL LOW (ref 70–99)
Glucose-Capillary: 94 mg/dL (ref 70–99)
Glucose-Capillary: 30 mg/dL — CL (ref 70–99)
Glucose-Capillary: 62 mg/dL — ABNORMAL LOW (ref 70–99)
Glucose-Capillary: 28 mg/dL — CL (ref 70–99)
Glucose-Capillary: 97 mg/dL (ref 70–99)
Glucose-Capillary: 115 mg/dL — ABNORMAL HIGH (ref 70–99)
Glucose-Capillary: 40 mg/dL — CL (ref 70–99)
Glucose-Capillary: 76 mg/dL (ref 70–99)
Glucose-Capillary: 63 mg/dL — ABNORMAL LOW (ref 70–99)
Glucose-Capillary: 87 mg/dL (ref 70–99)

## 2017-12-21 LAB — MRSA PCR SCREENING: MRSA by PCR: NEGATIVE

## 2017-12-21 LAB — HEPATIC FUNCTION PANEL
ALK PHOS: 78 U/L (ref 38–126)
ALT: 10 U/L (ref 0–44)
AST: 20 U/L (ref 15–41)
Albumin: 2.4 g/dL — ABNORMAL LOW (ref 3.5–5.0)
BILIRUBIN INDIRECT: 0.6 mg/dL (ref 0.3–0.9)
Bilirubin, Direct: 0.2 mg/dL (ref 0.0–0.2)
TOTAL PROTEIN: 6.2 g/dL — AB (ref 6.5–8.1)
Total Bilirubin: 0.8 mg/dL (ref 0.3–1.2)

## 2017-12-21 LAB — CBC WITH DIFFERENTIAL/PLATELET
Abs Immature Granulocytes: 0.13 10*3/uL — ABNORMAL HIGH (ref 0.00–0.07)
Basophils Absolute: 0 10*3/uL (ref 0.0–0.1)
Basophils Relative: 0 %
EOS PCT: 1 %
Eosinophils Absolute: 0.2 10*3/uL (ref 0.0–0.5)
HCT: 25.2 % — ABNORMAL LOW (ref 36.0–46.0)
Hemoglobin: 8.1 g/dL — ABNORMAL LOW (ref 12.0–15.0)
Immature Granulocytes: 1 %
Lymphocytes Relative: 4 %
Lymphs Abs: 0.5 10*3/uL — ABNORMAL LOW (ref 0.7–4.0)
MCH: 26.2 pg (ref 26.0–34.0)
MCHC: 32.1 g/dL (ref 30.0–36.0)
MCV: 81.6 fL (ref 80.0–100.0)
MONO ABS: 0.5 10*3/uL (ref 0.1–1.0)
Monocytes Relative: 3 %
Neutro Abs: 13.6 10*3/uL — ABNORMAL HIGH (ref 1.7–7.7)
Neutrophils Relative %: 91 %
Platelets: 337 10*3/uL (ref 150–400)
RBC: 3.09 MIL/uL — AB (ref 3.87–5.11)
RDW: 13.2 % (ref 11.5–15.5)
WBC: 14.9 10*3/uL — ABNORMAL HIGH (ref 4.0–10.5)
nRBC: 0 % (ref 0.0–0.2)

## 2017-12-21 LAB — POCT I-STAT, CHEM 8
BUN: 37 mg/dL — ABNORMAL HIGH (ref 6–20)
CREATININE: 10.7 mg/dL — AB (ref 0.44–1.00)
Calcium, Ion: 0.86 mmol/L — CL (ref 1.15–1.40)
Chloride: 92 mmol/L — ABNORMAL LOW (ref 98–111)
GLUCOSE: 71 mg/dL (ref 70–99)
HCT: 26 % — ABNORMAL LOW (ref 36.0–46.0)
Hemoglobin: 8.8 g/dL — ABNORMAL LOW (ref 12.0–15.0)
Potassium: 3.3 mmol/L — ABNORMAL LOW (ref 3.5–5.1)
Sodium: 128 mmol/L — ABNORMAL LOW (ref 135–145)
TCO2: 27 mmol/L (ref 22–32)

## 2017-12-21 LAB — LACTIC ACID, PLASMA
Lactic Acid, Venous: 1.2 mmol/L (ref 0.5–1.9)
Lactic Acid, Venous: 1.1 mmol/L (ref 0.5–1.9)

## 2017-12-21 LAB — PROCALCITONIN: Procalcitonin: 0.34 ng/mL

## 2017-12-21 LAB — CBC
HCT: 22 % — ABNORMAL LOW (ref 36.0–46.0)
Hemoglobin: 7.3 g/dL — ABNORMAL LOW (ref 12.0–15.0)
MCH: 26.6 pg (ref 26.0–34.0)
MCHC: 33.2 g/dL (ref 30.0–36.0)
MCV: 80.3 fL (ref 80.0–100.0)
PLATELETS: 299 10*3/uL (ref 150–400)
RBC: 2.74 MIL/uL — ABNORMAL LOW (ref 3.87–5.11)
RDW: 13.2 % (ref 11.5–15.5)
WBC: 13.1 10*3/uL — ABNORMAL HIGH (ref 4.0–10.5)
nRBC: 0 % (ref 0.0–0.2)

## 2017-12-21 LAB — MAGNESIUM: Magnesium: 1.4 mg/dL — ABNORMAL LOW (ref 1.7–2.4)

## 2017-12-21 LAB — CG4 I-STAT (LACTIC ACID): Lactic Acid, Venous: 1.07 mmol/L (ref 0.5–1.9)

## 2017-12-21 LAB — TSH: TSH: 8.38 u[IU]/mL — ABNORMAL HIGH (ref 0.350–4.500)

## 2017-12-21 LAB — LIPASE, BLOOD: Lipase: 30 U/L (ref 11–51)

## 2017-12-21 LAB — TROPONIN I: TROPONIN I: 0.04 ng/mL — AB (ref <0.03)

## 2017-12-21 LAB — GLUCOSE, RANDOM: Glucose, Bld: 55 mg/dL — ABNORMAL LOW (ref 70–99)

## 2017-12-21 MED ORDER — DEXTROSE 10 % IV SOLN
INTRAVENOUS | Status: DC
  Administered 2017-12-21: 07:00:00 via INTRAVENOUS

## 2017-12-21 MED ORDER — EPOETIN ALFA 10000 UNIT/ML IJ SOLN
10000.0000 [IU] | INTRAMUSCULAR | Status: DC
  Administered 2017-12-22: 10000 [IU] via INTRAVENOUS
  Filled 2017-12-21: qty 1

## 2017-12-21 MED ORDER — DEXTROSE 50 % IV SOLN
12.5000 g | INTRAVENOUS | Status: AC
  Administered 2017-12-21: 12.5 g via INTRAVENOUS

## 2017-12-21 MED ORDER — DEXTROSE 50 % IV SOLN
25.0000 g | INTRAVENOUS | Status: AC
  Administered 2017-12-21: 25 g via INTRAVENOUS
  Filled 2017-12-21 (×3): qty 50

## 2017-12-21 MED ORDER — DEXTROSE 50 % IV SOLN
INTRAVENOUS | Status: AC
  Administered 2017-12-21: 19:00:00
  Filled 2017-12-21: qty 50

## 2017-12-21 MED ORDER — DEXTROSE 50 % IV SOLN
25.0000 mL | Freq: Once | INTRAVENOUS | Status: AC
  Administered 2017-12-21: 50 mL via INTRAVENOUS
  Administered 2017-12-21: 25 mL via INTRAVENOUS
  Filled 2017-12-21: qty 50

## 2017-12-21 MED ORDER — DEXTROSE 10 % IV SOLN
INTRAVENOUS | Status: DC
  Administered 2017-12-21 – 2017-12-22 (×4): via INTRAVENOUS

## 2017-12-21 MED ORDER — DEXTROSE 5 % IV SOLN
INTRAVENOUS | Status: DC
  Administered 2017-12-21: 14:00:00 via INTRAVENOUS

## 2017-12-21 MED ORDER — DEXTROSE 50 % IV SOLN
25.0000 mL | Freq: Once | INTRAVENOUS | Status: AC
  Administered 2017-12-21: 25 mL via INTRAVENOUS

## 2017-12-21 MED ORDER — HEPARIN SODIUM (PORCINE) 5000 UNIT/ML IJ SOLN
5000.0000 [IU] | Freq: Three times a day (TID) | INTRAMUSCULAR | Status: DC
  Administered 2017-12-21 – 2017-12-22 (×3): 5000 [IU] via SUBCUTANEOUS
  Filled 2017-12-21 (×3): qty 1

## 2017-12-21 MED ORDER — MAGNESIUM CHLORIDE 64 MG PO TBEC
1.0000 | DELAYED_RELEASE_TABLET | Freq: Once | ORAL | Status: AC
  Administered 2017-12-21: 64 mg via ORAL
  Filled 2017-12-21: qty 1

## 2017-12-21 MED ORDER — GENTAMICIN SULFATE 0.1 % EX CREA
TOPICAL_CREAM | Freq: Once | CUTANEOUS | Status: AC
  Administered 2017-12-21: 22:00:00 via TOPICAL
  Filled 2017-12-21: qty 15

## 2017-12-21 MED ORDER — ENOXAPARIN SODIUM 40 MG/0.4ML ~~LOC~~ SOLN: 40.0000 mg | SUBCUTANEOUS | Status: DC

## 2017-12-21 MED ORDER — DEXTROSE 50 % IV SOLN
INTRAVENOUS | Status: AC
  Administered 2017-12-21: 50 mL via INTRAVENOUS
  Filled 2017-12-21: qty 50

## 2017-12-21 MED ORDER — DEXTROSE 50 % IV SOLN
INTRAVENOUS | Status: AC
  Filled 2017-12-21: qty 50

## 2017-12-21 MED ORDER — ONDANSETRON HCL 4 MG PO TABS: 4.0000 mg | ORAL_TABLET | Freq: Four times a day (QID) | ORAL | Status: DC | PRN

## 2017-12-21 MED ORDER — DEXTROSE 50 % IV SOLN
50.0000 mL | Freq: Once | INTRAVENOUS | Status: AC
  Administered 2017-12-21: 50 mL via INTRAVENOUS

## 2017-12-21 MED ORDER — ONDANSETRON HCL 4 MG/2ML IJ SOLN
4.0000 mg | Freq: Four times a day (QID) | INTRAMUSCULAR | Status: DC | PRN
  Administered 2017-12-22: 4 mg via INTRAVENOUS
  Filled 2017-12-21: qty 2

## 2017-12-21 MED ORDER — GLUCAGON HCL RDNA (DIAGNOSTIC) 1 MG IJ SOLR: 0.2500 mg | INTRAMUSCULAR | Status: DC | PRN

## 2017-12-21 MED ORDER — DEXTROSE 50 % IV SOLN
1.0000 | Freq: Once | INTRAVENOUS | Status: AC
  Administered 2017-12-21: 50 mL via INTRAVENOUS

## 2017-12-21 MED ORDER — EPOETIN ALFA 10000 UNIT/ML IJ SOLN
10000.0000 [IU] | Freq: Once | INTRAMUSCULAR | Status: AC
  Administered 2017-12-21: 10000 [IU] via INTRAVENOUS

## 2017-12-21 NOTE — Progress Notes (Signed)
Blood sugar came up to 101 then dropped back down to 61.  Hypoglycemia protocol being followed.  Dr Margaretmary Eddy is aware of blood sugars and that home meds have not been ordered at this time.  She has ordered dextrose at 71ml/hr

## 2017-12-21 NOTE — Progress Notes (Signed)
[facility] Progress Note Patient Name: Jacqueline Rodriguez DOB: 1960/05/05 MRN: [id number]   Date of Service  12/21/2017  HPI/Events of Note  Pt transferred from the floors for hypoglycemia.  eICU Interventions  Continue D10.  Check FS q1 x 2 then q2 the rest of the night.      Intervention Category Minor Interventions: Other:  Elsie Lincoln 12/21/2017, 7:50 PM

## 2017-12-21 NOTE — ED Notes (Signed)
Pt placed on bedpan; not able to urinate at this time. Pt aware that we need sample. Pt declined rectal temp at first, but agreed when she understood that her temp was not registering orally or axillary. Rectal temp 93. Pt given warm blankets and a pillow placed under her left side for mitigation of back pain. Pt sleepy but responds to verbal stimuli, answers questions appropriately. NAD noted.

## 2017-12-21 NOTE — Progress Notes (Signed)
Patient arrived from the ED.  Temp would only read 93.6.  Bear hugger put on the patient immediately

## 2017-12-21 NOTE — Progress Notes (Signed)
Report called to Clarise Cruz in CCU.  Patient transferred via bed.

## 2017-12-21 NOTE — Progress Notes (Signed)
Family Meeting Note  Advance Directive:yes  Today a meeting took place with the Patient.     The following clinical team members were present during this meeting:MD  The following were discussed:Patient's diagnosis: Encephalopathy, hypoglycemia, hypothermia end-stage renal disease on peritoneal dialysis and hemodialysis, diabetes mellitus, CHF, GERD, COPD,, Treatment plan of care discussed in detail with the patient and husband at bedside.  They both verbalized understanding of the plan   patient's progosis: Unable to determine and Goals for treatment: Full Code  [name] and son Jacqueline Rodriguez are healthcare POA  Additional follow-up to be provided: Hospitalist, nephrology  Time spent during discussion:17 MIN  Jacqueline Mango, MD

## 2017-12-21 NOTE — Progress Notes (Signed)
HD tx end    12/21/17 2308  Vital Signs  Pulse Rate 87  Pulse Rate Source Monitor  Resp 12  BP (!) 154/65  BP Location Right Arm  BP Method Automatic  Patient Position (if appropriate) Lying  Oxygen Therapy  SpO2 100 %  O2 Device Room Air  During Hemodialysis Assessment  Dialysis Fluid Bolus Normal Saline  Bolus Amount (mL) 250 mL  Intra-Hemodialysis Comments Tx completed

## 2017-12-21 NOTE — ED Provider Notes (Signed)
[facility] EMERGENCY DEPARTMENT Provider Note   CSN: [id number] Arrival date & time: 12/21/17  9163     History   Chief Complaint Chief Complaint  Patient presents with  . Hypoglycemia    HPI Jacqueline Rodriguez is a 57 y.o. female history of CKD on peritoneal dialysis, CHF, diabetes, reflux here presenting with weakness, hypoglycemia, altered mental status.  Patient is from home and was sleeping last night.  Per the family, patient is difficult to arouse so family called EMS.  When EMS got there her blood sugar was 42.  She was given 200 mL's of D10 and CBG went up to 160.  Patient states that she did not use dialysis last night and has been running low on supplies and last peritoneal dialysis was 2 days ago.  She states that her nephrologist is Dr. Abigail Butts, who wants her to get dialysis in the dialysis center.  Denies any fevers or abdominal pain or any purulent drainage from the catheter.   The history is provided by the patient and the EMS personnel.    Past Medical History:  Diagnosis Date  . Anemia   . Chronic bronchitis ([place])   . Chronic diastolic CHF (congestive heart failure) ([place])   . CKD (chronic kidney disease), stage III ([place])   . COPD (chronic obstructive pulmonary disease) ([place])   . Diabetes mellitus with renal complications ([place])   . Essential hypertension   . GERD (gastroesophageal reflux disease)   . Obesity   . Peripheral vascular disease ([place])   . Pulmonary hypertension ([place])   . Renal insufficiency 09/19/2015   Stage 3 CKD. Beginning dialysis.    Patient Active Problem List   Diagnosis Date Noted  . Hypotension 09/07/2017  . Peritoneal dialysis catheter exit site infection ([place]) 10/05/2016  . 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 Hypocalcemia 09/14/2016  . Hyperkalemia 09/13/2016  . Syncope 09/08/2016  . ESRD on dialysis ([place]) 05/25/2016  . Chronic diastolic heart failure ([place]) 07/30/2015  . Acute renal failure  superimposed on stage 3 chronic kidney disease ([place]) 07/18/2015  . Diabetes mellitus with renal complications ([place])   . CKD (chronic kidney disease), stage III ([place])   . Pulmonary hypertension ([place])   . Obesity   . Morbid obesity due to excess calories ([place])   . Anemia in chronic renal disease   . Shortness of breath   . Back pain 05/01/2015  . Essential hypertension 03/25/2015  . Diabetes ([place]) 03/25/2015    Past Surgical History:  Procedure Laterality Date  . A/V FISTULAGRAM N/A 09/16/2016   Procedure: A/V Fistulagram;  Surgeon: Algernon Huxley, MD;  Location: [facility];  Service: Cardiovascular;  Laterality: N/A;  . AV FISTULA PLACEMENT Left 10/01/2015   Procedure: ARTERIOVENOUS (AV) FISTULA CREATION ( BRACHIAL CEPHALIC );  Surgeon: Algernon Huxley, MD;  Location: [facility];  Service: Vascular;  Laterality: Left;  . CAPD INSERTION N/A 06/10/2016   Procedure: LAPAROSCOPIC INSERTION CONTINUOUS AMBULATORY PERITONEAL DIALYSIS  (CAPD) CATHETER;  Surgeon: Algernon Huxley, MD;  Location: [facility];  Service: Vascular;  Laterality: N/A;  . DIALYSIS/PERMA CATHETER INSERTION  09/16/2016   Procedure: DIALYSIS/PERMA CATHETER INSERTION;  Surgeon: Algernon Huxley, MD;  Location: [facility];  Service: Cardiovascular;;  . DIALYSIS/PERMA CATHETER REMOVAL N/A 12/06/2016   Procedure: DIALYSIS/PERMA CATHETER REMOVAL;  Surgeon: Algernon Huxley, MD;  Location: [facility];  Service: Cardiovascular;  Laterality: N/A;  . PERIPHERAL VASCULAR CATHETERIZATION N/A 07/21/2015  Procedure: Dialysis/Perma Catheter;  Surgeon: Algernon Huxley, MD;  Location: [facility];  Service: Cardiovascular;  Laterality: N/A;  . PERIPHERAL VASCULAR CATHETERIZATION N/A 12/11/2015   Procedure: Dialysis/Perma Catheter Removal;  Surgeon: Algernon Huxley, MD;  Location: [facility];  Service: Cardiovascular;  Laterality: N/A;     OB History   None      Home Medications    Prior to Admission medications     Medication Sig Start Date End Date Taking? Authorizing Provider  acetaminophen (TYLENOL) 500 MG tablet Take 500 mg by mouth every 6 (six) hours as needed.   Yes [provider]  albuterol (PROVENTIL HFA;VENTOLIN HFA) 108 (90 Base) MCG/ACT inhaler Inhale 2 puffs into the lungs every 6 (six) hours as needed for wheezing or shortness of breath. 01/08/15  Yes Gregor Hams, MD  amLODipine (NORVASC) 5 MG tablet Take 5 mg by mouth daily. 06/22/17  Yes [provider]  aspirin EC 81 MG EC tablet Take 1 tablet (81 mg total) by mouth daily. Patient taking differently: Take 162 mg by mouth at bedtime.  03/20/15  Yes Vaughan Basta, MD  cloNIDine (CATAPRES) 0.2 MG tablet Take 0.2 mg by mouth 2 (two) times daily. 07/02/17  Yes [provider]  furosemide (LASIX) 40 MG tablet Take 40 mg by mouth daily. 12/04/17  Yes [provider]  gabapentin (NEURONTIN) 100 MG capsule Take 100 mg by mouth 3 (three) times daily as needed. For neuropathy. (patient usually only takes one capsule a day) 03/05/15  Yes [provider]  gentamicin ointment (GARAMYCIN) 0.1 % Apply topically 3 (three) times daily. 09/10/17  Yes Gladstone Lighter, MD  glipiZIDE (GLUCOTROL) 5 MG tablet Take 5 mg by mouth daily.   Yes [provider]  lactulose (CHRONULAC) 10 GM/15ML solution Take 30 mLs by mouth every other day.  08/15/17  Yes [provider]  lidocaine-prilocaine (EMLA) cream Apply topically 3 (three) times a week. Apply 45 minutes prior to dialysis treatment three times a week 10/22/17  Yes [provider]  lisinopril (PRINIVIL,ZESTRIL) 10 MG tablet Take 1 tablet by mouth daily. 08/28/17  Yes [provider]  meclizine (ANTIVERT) 25 MG tablet Take 1 tablet (25 mg total) by mouth 3 (three) times daily as needed for dizziness. Patient taking differently: Take 25 mg by mouth daily.  12/08/15  Yes Nance Pear, MD  ondansetron (ZOFRAN) 4 MG tablet  Take 1 tablet (4 mg total) by mouth every 6 (six) hours as needed for nausea. 09/17/16  Yes Wieting, Richard, MD  sevelamer carbonate (RENVELA) 800 MG tablet Take 1,600 mg by mouth 3 (three) times daily with meals.   Yes [provider]  calcium acetate (PHOSLO) 667 MG capsule Take 3 capsules (2,001 mg total) by mouth 3 (three) times daily with meals. Patient not taking: Reported on 12/21/2017 09/10/17   Gladstone Lighter, MD  cetirizine (ZYRTEC) 10 MG tablet Take 10 mg by mouth daily as needed for allergies.    [provider]  GLUCOSAMINE-CHONDROITIN PO Take 4,000 mg by mouth daily.    [provider]  sertraline (ZOLOFT) 100 MG tablet Take 1 tablet by mouth daily. 08/02/17   [provider]    Family History Family History  Problem Relation Age of Onset  . Hypertension Mother   . Diabetes Mellitus II Mother   . CAD Father   . Hypertension Father   . Heart attack Father     Social History Social History  Tobacco Use  . Smoking status: Former Smoker    Packs/day: 0.50    Years: 5.00    Pack years: 2.50    Types: Cigarettes    Last attempt to quit: 03/24/1985    Years since quitting: 32.7  . Smokeless tobacco: Never Used  Substance Use Topics  . Alcohol use: No    Alcohol/week: 0.0 standard drinks  . Drug use: No     Allergies   Ambien [zolpidem]   Review of Systems Review of Systems  Neurological: Positive for weakness.  All other systems reviewed and are negative.    Physical Exam Updated Vital Signs BP (!) 157/93   Pulse 79   Temp (!) 95 F (35 C)   Resp 20   Ht 5\' 8"  (1.727 m)   Wt 84.8 kg   LMP 09/18/2013 (Approximate) Comment: years ago per pt prior to 2015  SpO2 96%   BMI 28.43 kg/m   Physical Exam  Constitutional: She is oriented to person, place, and time.  Chronically ill   HENT:  Head: Normocephalic.  Mouth/Throat: Oropharynx is clear and moist.  Eyes: Pupils are equal, round, and reactive to light.  Conjunctivae and EOM are normal.  Neck: Normal range of motion. Neck supple.  Cardiovascular: Normal rate, regular rhythm and normal heart sounds.  Pulmonary/Chest: Effort normal.  Diminished bilateral bases   Abdominal: Soft. Bowel sounds are normal.  Peritoneal dialysis with clear fluid, no obvious surrounding erythema. Nontender abdomen   Musculoskeletal: Normal range of motion.  Neurological: She is alert and oriented to person, place, and time.  Skin: Skin is warm. Capillary refill takes less than 2 seconds.  Psychiatric: She has a normal mood and affect.  Nursing note and vitals reviewed.    ED Treatments / Results  Labs (all labs ordered are listed, but only abnormal results are displayed) Labs Reviewed  MAGNESIUM - Abnormal; Notable for the following components:      Result Value   Magnesium 1.4 (*)    All other components within normal limits  HEPATIC FUNCTION PANEL - Abnormal; Notable for the following components:   Total Protein 6.2 (*)    Albumin 2.4 (*)    All other components within normal limits  TROPONIN I - Abnormal; Notable for the following components:   Troponin I 0.04 (*)    All other components within normal limits  CBC WITH DIFFERENTIAL/PLATELET - Abnormal; Notable for the following components:   WBC 14.9 (*)    RBC 3.09 (*)    Hemoglobin 8.1 (*)    HCT 25.2 (*)    Neutro Abs 13.6 (*)    Lymphs Abs 0.5 (*)    Abs Immature Granulocytes 0.13 (*)    All other components within normal limits  GLUCOSE, CAPILLARY - Abnormal; Notable for the following components:   Glucose-Capillary 60 (*)    All other components within normal limits  POCT I-STAT, CHEM 8 - Abnormal; Notable for the following components:   Sodium 128 (*)    Potassium 3.3 (*)    Chloride 92 (*)    BUN 37 (*)    Creatinine, Ser 10.70 (*)    Calcium, Ion 0.86 (*)    Hemoglobin 8.8 (*)    HCT 26.0 (*)    All other components within normal limits  URINE CULTURE  GLUCOSE, CAPILLARY    LIPASE, BLOOD  URINALYSIS, ROUTINE W REFLEX MICROSCOPIC  I-STAT CHEM 8, ED  I-STAT CG4 LACTIC ACID, ED  CG4 I-STAT (LACTIC ACID)  CBG MONITORING, ED    EKG EKG Interpretation  Date/Time:  Wednesday December 21 2017 06:37:04 EST Ventricular Rate:  81 PR Interval:    QRS Duration: 112 QT Interval:  453 QTC Calculation: 513 R Axis:   -64 Text Interpretation:  Ventricular-paced complexes No further rhythm analysis attempted due to paced rhythm Left anterior fascicular block Anteroseptal infarct, old Nonspecific repol abnormality, lateral leads Prolonged QT interval Baseline wander in lead(s) I III aVL V2 No significant change since last tracing Confirmed by Wandra Arthurs [phone]) on 12/21/2017 7:03:32 AM   Radiology Dg Chest Portable 1 View  Result Date: 12/21/2017 CLINICAL DATA:  57 year old female with hypoglycemia. Peritoneal dialysis. EXAM: PORTABLE CHEST 1 VIEW COMPARISON:  12/16/2016 and earlier. FINDINGS: Portable AP semi upright view at 0654 hours. Stable lung volumes. Mild chronic cardiomegaly. Chronic retrocardiac/left lung base hypo ventilation appears stable since 2018. Visualized tracheal air column is within normal limits. No pneumothorax, pulmonary edema, or acute pulmonary opacity. No acute osseous abnormality identified. IMPRESSION: Chronic left lung base hypo-ventilation. No acute cardiopulmonary abnormality. Electronically Signed   By: Genevie Ann M.D.   On: 12/21/2017 07:26    Procedures Procedures (including critical care time)  CRITICAL CARE Performed by: Wandra Arthurs   Total critical care time:30 minutes  Critical care time was exclusive of separately billable procedures and treating other patients.  Critical care was necessary to treat or prevent imminent or life-threatening deterioration.  Critical care was time spent personally by me on the following activities: development of treatment plan with patient and/or surrogate as well as nursing, discussions with  consultants, evaluation of patient's response to treatment, examination of patient, obtaining history from patient or surrogate, ordering and performing treatments and interventions, ordering and review of laboratory studies, ordering and review of radiographic studies, pulse oximetry and re-evaluation of patient's condition.   Medications Ordered in ED Medications  dextrose 10 % infusion ( Intravenous Rate/Dose Change 12/21/17 0811)  dextrose 50 % solution 25 mL (25 mLs Intravenous Given 12/21/17 0810)  magnesium chloride (SLOW-MAG) 64 MG SR tablet 64 mg (64 mg Oral Given 12/21/17 0818)     Initial Impression / Assessment and Plan / ED Course  I have reviewed the triage vital signs and the nursing notes.  Pertinent labs & imaging results that were available during my care of the patient were reviewed by me and considered in my medical decision making (see chart for details).    Jacqueline Rodriguez is a 57 y.o. female here with hypoglycemia. Likely from uncompliance with dialysis vs infections vs dehydration. Will get labs, UA, CXR. Will start on D10 drip.   8:35 AM Patient hypothermic to 95 rectally. WBC 15. But CXR clear. No fevers at home. CBG dropped to 60 on D10 drip. Given extra 1/2 amp D50 and increased D10 drip to 150 cc/hr. Given food to patient. I think hypoglycemia likely from dehydration vs uncompliance with dialysis. I talked to Dr. Abigail Butts from nephrology who will dialyze patient. No source of infection currently so I held abx. Hospitalist to admit for hypoglycemia.    Final Clinical Impressions(s) / ED Diagnoses   Final diagnoses:  None    ED Discharge Orders    None       Drenda Freeze, MD 12/21/17 [id number]

## 2017-12-21 NOTE — Progress Notes (Signed)
HD tx start    12/21/17 1958  Vital Signs  Pulse Rate 90  Pulse Rate Source Monitor  Resp 18  BP 138/68  BP Location Right Arm  BP Method Automatic  Patient Position (if appropriate) Lying  Oxygen Therapy  SpO2 100 %  O2 Device Room Air  During Hemodialysis Assessment  Blood Flow Rate (mL/min) 300 mL/min  Arterial Pressure (mmHg) -120 mmHg  Venous Pressure (mmHg) 110 mmHg  Transmembrane Pressure (mmHg) 50 mmHg  Ultrafiltration Rate (mL/min) 170 mL/min  Dialysate Flow Rate (mL/min) 600 ml/min  Conductivity: Machine  14.1  HD Safety Checks Performed Yes  Dialysis Fluid Bolus Normal Saline  Bolus Amount (mL) 250 mL  Intra-Hemodialysis Comments Tx initiated  Fistula / Graft Left Upper arm Arteriovenous fistula  Placement Date/Time: 10/01/15 1110   Placed prior to admission: No  Orientation: Left  Access Location: Upper arm  Access Type: Arteriovenous fistula  Status Accessed  Needle Size 16

## 2017-12-21 NOTE — Progress Notes (Signed)
Pre HD assessment    12/21/17 1936  Neurological  Level of Consciousness Alert  Orientation Level Oriented X4  Respiratory  Respiratory Pattern Regular;Unlabored  Cardiac  ECG Monitor Yes  Cardiac Rhythm NSR  Vascular  R Radial Pulse +2  L Radial Pulse +2  Integumentary  Integumentary (WDL) X  Skin Color Appropriate for ethnicity  Musculoskeletal  Musculoskeletal (WDL) X  Generalized Weakness Yes  Assistive Device None  GU Assessment  Genitourinary (WDL) X  Genitourinary Symptoms  (HD)  Psychosocial  Psychosocial (WDL) WDL

## 2017-12-21 NOTE — Progress Notes (Signed)
PD exit site care performed.    12/21/17 1900  Completion  Exit Site Care Performed Yes  Education / Care Plan  Dialysis Education Provided Yes  Documented Education in Care Plan Yes  Hand-Off documentation  Report given to (Full Name) Stana Bunting  Report received from (Full Name) Stark Bray

## 2017-12-21 NOTE — Progress Notes (Signed)
Pre HD assessment    12/21/17 1935  Vital Signs  Temp 98.7 F (37.1 C)  Temp Source Oral  Pulse Rate 88  Pulse Rate Source Monitor  Resp 15  BP (!) 145/59  BP Location Right Arm  BP Method Automatic  Patient Position (if appropriate) Lying  Oxygen Therapy  SpO2 100 %  O2 Device Room Air  Pain Assessment  Pain Scale 0-10  Pain Score 0  Dialysis Weight  Weight 88.5 kg  Type of Weight Pre-Dialysis  Time-Out for Hemodialysis  What Procedure? HD  Pt Identifiers(min of two) First/Last Name;MRN/Account#  Correct Site? Yes  Correct Side? Yes  Correct Procedure? Yes  Consents Verified? Yes  Rad Studies Available? N/A  Safety Precautions Reviewed? Yes  Engineer, civil (consulting) Number  (1A)  Station Number  (bedside ICU 10)  UF/Alarm Test Passed  Conductivity: Meter 14  Conductivity: Machine  14.1  pH 7.6  Reverse Osmosis WRO # 1  Normal Saline Lot Number [id number]  Dialyzer Lot Number [id number]  Disposable Set Lot Number [id number]  Machine Temperature 98.6 F (37 C)  Musician and Audible Yes  Blood Lines Intact and Secured Yes  Pre Treatment Patient Checks  Vascular access used during treatment Fistula  Hepatitis B Surface Antigen Results  (unk )  Hepatitis B Surface Antibody  (unk)  Date Hepatitis B Surface Antibody Drawn 12/21/17  Hemodialysis Consent Verified Yes  Hemodialysis Standing Orders Initiated Yes  ECG (Telemetry) Monitor On Yes  Prime Ordered Normal Saline  Length of  DialysisTreatment -hour(s) 3 Hour(s)  Dialyzer Elisio 17H NR  Dialysate 4K  Dialysis Anticoagulant None  Dialysate Flow Ordered 600  Blood Flow Rate Ordered 300 mL/min  Ultrafiltration Goal 0 Liters  Pre Treatment Labs Hepatitis B Surface Antigen;Renal panel (HBSAB, HB core , Potassium )  Dialysis Blood Pressure Support Ordered Normal Saline  Education / Care Plan  Dialysis Education Provided Yes  Documented Education in Care Plan Yes  Fistula / Graft Left Upper arm  Arteriovenous fistula  Placement Date/Time: 10/01/15 1110   Placed prior to admission: No  Orientation: Left  Access Location: Upper arm  Access Type: Arteriovenous fistula  Site Condition No complications  Fistula / Graft Assessment Present;Thrill;Bruit  Drainage Description None

## 2017-12-21 NOTE — Progress Notes (Signed)
Post HD assessment    12/21/17 2315  Neurological  Level of Consciousness Alert  Orientation Level Oriented X4  Respiratory  Respiratory Pattern Regular;Unlabored  Chest Assessment Chest expansion symmetrical  Cardiac  ECG Monitor Yes  Cardiac Rhythm NSR  Vascular  R Radial Pulse +2  L Radial Pulse +2  Integumentary  Integumentary (WDL) X  Skin Color Appropriate for ethnicity  Musculoskeletal  Musculoskeletal (WDL) X  Generalized Weakness Yes  Assistive Device None  GU Assessment  Genitourinary (WDL) X  Genitourinary Symptoms  (HD)  Psychosocial  Psychosocial (WDL) WDL

## 2017-12-21 NOTE — ED Triage Notes (Signed)
Pt arrived to the ED via EMS from home for loss of consciousness secondary to having a BG of 42. EMS gave 226ml D10 and got the BG to 160. Pt is sleepy but responds to verbal stimuli.  Pt is a diabetic that does not check her BG on a regular bases and is also a peritoneal dialysis Pt that does not do dialysis like she is supposed to.

## 2017-12-21 NOTE — Progress Notes (Signed)
CBG continues to be low.  Dr Margaretmary Eddy notified.  She has requested that I increase IVF to 100 and push only a 1/2 amp of dextrose

## 2017-12-21 NOTE — Progress Notes (Signed)
[facility]  ROUNDING NOTE   Subjective:   [name] admitted to [facility] on 12/21/2017 for Hypoglycemia [E16.2] ESRD (end stage renal disease) on dialysis ([place]) [N18.6, Z99.2]  Patient has missed over 2 months of in center hemodialysis. Claims to be doing peritoneal dialysis at home every other day with supplies.   Objective:  Vital signs in last 24 hours:  Temp:  [93 F (33.9 C)-99 F (37.2 C)] 99 F (37.2 C) (12/11 1411) Pulse Rate:  [79-93] 90 (12/11 1208) Resp:  [10-20] 20 (12/11 1208) BP: (144-180)/(62-93) 152/63 (12/11 1208) SpO2:  [93 %-100 %] 93 % (12/11 1208) Weight:  [84.8 kg-88.8 kg] 88.8 kg (12/11 1208)  Weight change:  Filed Weights   12/21/17 0650 12/21/17 1208  Weight: 84.8 kg 88.8 kg    Intake/Output: No intake/output data recorded.   Intake/Output this shift:  Total I/O In: 388.9 [I.V.:388.9] Out: -   Physical Exam: General: NAD,   Head: Normocephalic, atraumatic. Moist oral mucosal membranes  Eyes: Anicteric, PERRL  Neck: Supple, trachea midline  Lungs:  Clear to auscultation  Heart: Regular rate and rhythm  Abdomen:  Soft, nontender,   Extremities:  trace peripheral edema.  Neurologic: Nonfocal, moving all four extremities  Skin: No lesions  Access: Left AVF, PD catheter    Basic Metabolic Panel: Recent Labs  Lab 12/21/17 0641 12/21/17 0707  NA  --  128*  K  --  3.3*  CL  --  92*  GLUCOSE  --  71  BUN  --  37*  CREATININE  --  10.70*  MG 1.4*  --     Liver Function Tests: Recent Labs  Lab 12/21/17 0641  AST 20  ALT 10  ALKPHOS 78  BILITOT 0.8  PROT 6.2*  ALBUMIN 2.4*   Recent Labs  Lab 12/21/17 0641  LIPASE 30   No results for input(s): AMMONIA in the last 168 hours.  CBC: Recent Labs  Lab 12/21/17 0707 12/21/17 0804 12/21/17 1321  WBC  --  14.9* 13.1*  NEUTROABS  --  13.6*  --   HGB 8.8* 8.1* 7.3*  HCT 26.0* 25.2* 22.0*  MCV  --  81.6 80.3  PLT  --  337 299    Cardiac Enzymes: Recent  Labs  Lab 12/21/17 0641  TROPONINI 0.04*    BNP: Invalid input(s): POCBNP  CBG: Recent Labs  Lab 12/21/17 1411 12/21/17 1430 12/21/17 1511 12/21/17 1614 12/21/17 1654  GLUCAP 94 57* 30* 79* 28*    Microbiology: Results for orders placed or performed during the hospital encounter of 09/07/17  Blood culture (routine x 2)     Status: None   Collection Time: 09/07/17  1:11 PM  Result Value Ref Range Status   Specimen Description BLOOD  Final   Special Requests NONE  Final   Culture   Final    NO GROWTH 5 DAYS Performed at [facility], [address] [place], [place] [address]    Report Status 09/12/2017 FINAL  Final  Blood culture (routine x 2)     Status: None   Collection Time: 09/07/17  1:11 PM  Result Value Ref Range Status   Specimen Description BLOOD RIGHT WRIST  Final   Special Requests   Final    BOTTLES DRAWN AEROBIC AND ANAEROBIC Blood Culture adequate volume   Culture   Final    NO GROWTH 5 DAYS Performed at [facility], [address] [place], [place] [address]  Report Status 09/12/2017 FINAL  Final  Body fluid culture     Status: None   Collection Time: 09/07/17  4:38 PM  Result Value Ref Range Status   Specimen Description   Final    PERITONEAL CAVITY Performed at [facility], [address] [place], [place] [address]    Special Requests   Final    NONE Performed at [facility], [address] [place], [place] [address]    Gram Stain   Final    WBC PRESENT,BOTH PMN AND MONONUCLEAR NO ORGANISMS SEEN CYTOSPIN SMEAR    Culture   Final    NO GROWTH 3 DAYS Performed at [facility], [place] [address], [place], [place] [address]    Report Status 09/11/2017 FINAL  Final  Acid Fast Smear (AFB)     Status: None   Collection Time: 09/07/17  4:38 PM  Result Value Ref Range Status   AFB Specimen Processing Concentration  Final   Acid Fast Smear Negative  Final    Comment: (NOTE) Performed At: [facility] [address] [place], [place] [id number] Rush Farmer MD WI:[id number]    Source (AFB) PERITONEAL  Final    Comment: Performed at [facility], [address] [place], [place] [address]  Acid Fast Culture with reflexed sensitivities     Status: None   Collection Time: 09/07/17  4:38 PM  Result Value Ref Range Status   Acid Fast Culture Negative  Final    Comment: (NOTE) No acid fast bacilli isolated after 6 weeks. Performed At: [facility] [place], [place] [id number] Rush Farmer MD [id number]    Source of Sample PERITONEAL  Final    Comment: Performed at [facility], [address] [place], [place] [address]  MRSA PCR Screening     Status: None   Collection Time: 09/07/17  6:30 PM  Result Value Ref Range Status   MRSA by PCR NEGATIVE NEGATIVE Final    Comment:        The GeneXpert MRSA Assay (FDA approved for NASAL specimens only), is one component of a comprehensive MRSA colonization surveillance program. It is not intended to diagnose MRSA infection nor to guide or monitor treatment for MRSA infections. Performed at [facility], [address] [place], [place] [address]     Coagulation Studies: No results for input(s): LABPROT, INR in the last 72 hours.  Urinalysis: No results for input(s): COLORURINE, LABSPEC, PHURINE, GLUCOSEU, HGBUR, BILIRUBINUR, KETONESUR, PROTEINUR, UROBILINOGEN, NITRITE, LEUKOCYTESUR in the last 72 hours.  Invalid input(s): APPERANCEUR    Imaging: Dg Chest Portable 1 View  Result Date: 12/21/2017 CLINICAL DATA:  57 year old female with hypoglycemia. Peritoneal dialysis. EXAM: PORTABLE CHEST 1 VIEW COMPARISON:  12/16/2016 and earlier. FINDINGS: Portable AP semi upright view at 0654 hours. Stable lung volumes. Mild chronic cardiomegaly. Chronic retrocardiac/left lung base hypo ventilation appears stable since 2018. Visualized tracheal air column is within  normal limits. No pneumothorax, pulmonary edema, or acute pulmonary opacity. No acute osseous abnormality identified. IMPRESSION: Chronic left lung base hypo-ventilation. No acute cardiopulmonary abnormality. Electronically Signed   By: Genevie Ann M.D.   On: 12/21/2017 07:26     Medications:   . dextrose 75 mL/hr at 12/21/17 1701   . dextrose  12.5 g Intravenous STAT  . dextrose  25 g Intravenous STAT  . heparin injection (subcutaneous)  5,000 Units Subcutaneous Q8H   glucagon (human recombinant), ondansetron **OR** ondansetron (ZOFRAN) IV  Assessment/  Plan:  Ms. ELLISYN ICENHOWER is a 57 y.o. white female with end stage renal disease on hemodialysis, pulmonary hypertension, peripheral vascular disease, GERD, diabetes mellitus type II, hypertension, COPD  TTS [facility] Left AVF  1. End Stage Renal Disease: has missed over 2 months of hemodialysis. Claims to be doing peritoneal dialysis at home.  - reinitiate hemodialysis. 16ga needles - outpatient planning for [facility]. To follow with my partner, Dr. Murlean Iba. Patient agrees with plan. Agrees to a Scientific laboratory technician. Patient wants to do peritoneal dialysis however will need to show compliance.   2. Hypertension: 152/63 - holding home blood pressure medications.   3. Diabetes mellitus type II with chronic kidney disease: with hypoglycemia  4. Anemia of chronic kidney disease: hemoglobin 7.3. Has not gotten EPO in 2 months - EPO with HD treatment   LOS: 0 Dura Mccormack 12/11/20195:46 PM

## 2017-12-21 NOTE — Progress Notes (Signed)
CBG's continue to be low.  Most recent was 28.  Dr Margaretmary Eddy ordered for me to increase dextrose IVF to 75.  She does not want the patient to get any more dextrose 50% at this time

## 2017-12-21 NOTE — Progress Notes (Signed)
Post HD assessment. PT tolerated tx well without c/o or complication. Net UF 11, goal met.    12/21/17 2316  Vital Signs  Temp 98.4 F (36.9 C)  Temp Source Oral  Pulse Rate 88  Pulse Rate Source Monitor  Resp 13  BP (!) 148/55  BP Location Right Arm  BP Method Automatic  Patient Position (if appropriate) Lying  Oxygen Therapy  SpO2 100 %  O2 Device Room Air  Dialysis Weight  Weight 88.7 kg  Type of Weight Post-Dialysis  Post-Hemodialysis Assessment  Rinseback Volume (mL) 250 mL  KECN 42.6 V  Dialyzer Clearance Lightly streaked  Duration of HD Treatment -hour(s) 3 hour(s)  Hemodialysis Intake (mL) 500 mL  UF Total -Machine (mL) 511 mL  Net UF (mL) 11 mL  Tolerated HD Treatment Yes  AVG/AVF Arterial Site Held (minutes) 10 minutes  AVG/AVF Venous Site Held (minutes) 10 minutes  Education / Care Plan  Dialysis Education Provided Yes  Documented Education in Care Plan Yes  Fistula / Graft Left Upper arm Arteriovenous fistula  Placement Date/Time: 10/01/15 1110   Placed prior to admission: No  Orientation: Left  Access Location: Upper arm  Access Type: Arteriovenous fistula  Site Condition No complications  Fistula / Graft Assessment Present;Thrill;Bruit  Status Deaccessed  Drainage Description None

## 2017-12-21 NOTE — ED Notes (Signed)
Rectal temp probe applied.

## 2017-12-21 NOTE — Progress Notes (Signed)
Most recent CBG was 62.  Dr Margaretmary Eddy gave order to change IVF to dextrose 10% at 69ml/hr

## 2017-12-21 NOTE — H&P (Signed)
[place] [facility] [place] NAME: Jacqueline Rodriguez    MR#:  [id number]  DATE OF BIRTH:  08/14/1960  DATE OF ADMISSION:  12/21/2017  PRIMARY CARE PHYSICIAN: Patient, No Pcp Per   REQUESTING/REFERRING PHYSICIAN: Drenda Freeze, MD  CHIEF COMPLAINT:  Altered mental status and hypoglycemia  HISTORY OF PRESENT ILLNESS:  Jacqueline Rodriguez  is a 57 y.o. female with a known history of end-stage renal disease both on peritoneal dialysis and hemodialysis, diabetes mellitus, CHF, GERD, COPD, peripheral vascular disease and other medical problems has been lethargic for the past several days and became altered today.  Husband called EMS and her Accu-Chek was at 42 patient was given 200 cc of D10 and CBG went up to 160 patient is brought into the emergency department as her sugar was persistently low she was started on D10 and hospitalist team is called to admit the patient.  Nephrology Dr. Juleen China has notified us her last PD was 2 days ago.  Her husband at bedside and patient is on bed hugger for hypothermia.  During my examination her sugars were 500+ and D10 drip discontinued and patient is wide-awake and answering all questions appropriately   PAST MEDICAL HISTORY:   Past Medical History:  Diagnosis Date  . Anemia   . Chronic bronchitis ([place])   . Chronic diastolic CHF (congestive heart failure) ([place])   . CKD (chronic kidney disease), stage III ([place])   . COPD (chronic obstructive pulmonary disease) ([place])   . Diabetes mellitus with renal complications ([place])   . Essential hypertension   . GERD (gastroesophageal reflux disease)   . Obesity   . Peripheral vascular disease ([place])   . Pulmonary hypertension ([place])   . Renal insufficiency 09/19/2015   Stage 3 CKD. Beginning dialysis.    PAST SURGICAL HISTOIRY:   Past Surgical History:  Procedure Laterality Date  . A/V FISTULAGRAM N/A 09/16/2016   Procedure: A/V Fistulagram;  Surgeon: Algernon Huxley, MD;   Location: [facility];  Service: Cardiovascular;  Laterality: N/A;  . AV FISTULA PLACEMENT Left 10/01/2015   Procedure: ARTERIOVENOUS (AV) FISTULA CREATION ( BRACHIAL CEPHALIC );  Surgeon: Algernon Huxley, MD;  Location: [facility];  Service: Vascular;  Laterality: Left;  . CAPD INSERTION N/A 06/10/2016   Procedure: LAPAROSCOPIC INSERTION CONTINUOUS AMBULATORY PERITONEAL DIALYSIS  (CAPD) CATHETER;  Surgeon: Algernon Huxley, MD;  Location: [facility];  Service: Vascular;  Laterality: N/A;  . DIALYSIS/PERMA CATHETER INSERTION  09/16/2016   Procedure: DIALYSIS/PERMA CATHETER INSERTION;  Surgeon: Algernon Huxley, MD;  Location: [facility];  Service: Cardiovascular;;  . DIALYSIS/PERMA CATHETER REMOVAL N/A 12/06/2016   Procedure: DIALYSIS/PERMA CATHETER REMOVAL;  Surgeon: Algernon Huxley, MD;  Location: [facility];  Service: Cardiovascular;  Laterality: N/A;  . PERIPHERAL VASCULAR CATHETERIZATION N/A 07/21/2015   Procedure: Dialysis/Perma Catheter;  Surgeon: Algernon Huxley, MD;  Location: [facility];  Service: Cardiovascular;  Laterality: N/A;  . PERIPHERAL VASCULAR CATHETERIZATION N/A 12/11/2015   Procedure: Dialysis/Perma Catheter Removal;  Surgeon: Algernon Huxley, MD;  Location: [facility];  Service: Cardiovascular;  Laterality: N/A;    SOCIAL HISTORY:   Social History   Tobacco Use  . Smoking status: Former Smoker    Packs/day: 0.50    Years: 5.00    Pack years: 2.50    Types: Cigarettes    Last attempt to quit: 03/24/1985    Years since quitting: 32.7  .  Smokeless tobacco: Never Used  Substance Use Topics  . Alcohol use: No    Alcohol/week: 0.0 standard drinks    FAMILY HISTORY:   Family History  Problem Relation Age of Onset  . Hypertension Mother   . Diabetes Mellitus II Mother   . CAD Father   . Hypertension Father   . Heart attack Father     DRUG ALLERGIES:   Allergies  Allergen Reactions  . Ambien [Zolpidem] Other (See Comments)     hallucinations    REVIEW OF SYSTEMS:  CONSTITUTIONAL: No fever, fatigue or weakness.  EYES: No blurred or double vision.  EARS, NOSE, AND THROAT: No tinnitus or ear pain.  RESPIRATORY: No cough, shortness of breath, wheezing or hemoptysis.  CARDIOVASCULAR: No chest pain, orthopnea, edema.  GASTROINTESTINAL: No nausea, vomiting, diarrhea or abdominal pain.  GENITOURINARY: No dysuria, hematuria.  ENDOCRINE: No polyuria, nocturia,  HEMATOLOGY: No anemia, easy bruising or bleeding SKIN: No rash or lesion. MUSCULOSKELETAL: No joint pain or arthritis.   NEUROLOGIC: No tingling, numbness, weakness.  PSYCHIATRY: No anxiety or depression.   MEDICATIONS AT HOME:   Prior to Admission medications   Medication Sig Start Date End Date Taking? Authorizing Provider  acetaminophen (TYLENOL) 500 MG tablet Take 500 mg by mouth every 6 (six) hours as needed.   Yes [provider]  albuterol (PROVENTIL HFA;VENTOLIN HFA) 108 (90 Base) MCG/ACT inhaler Inhale 2 puffs into the lungs every 6 (six) hours as needed for wheezing or shortness of breath. 01/08/15  Yes Gregor Hams, MD  amLODipine (NORVASC) 5 MG tablet Take 5 mg by mouth daily. 06/22/17  Yes [provider]  aspirin EC 81 MG EC tablet Take 1 tablet (81 mg total) by mouth daily. Patient taking differently: Take 162 mg by mouth at bedtime.  03/20/15  Yes Vaughan Basta, MD  cloNIDine (CATAPRES) 0.2 MG tablet Take 0.2 mg by mouth 2 (two) times daily. 07/02/17  Yes [provider]  furosemide (LASIX) 40 MG tablet Take 40 mg by mouth daily. 12/04/17  Yes [provider]  gabapentin (NEURONTIN) 100 MG capsule Take 100 mg by mouth 3 (three) times daily as needed. For neuropathy. (patient usually only takes one capsule a day) 03/05/15  Yes [provider]  gentamicin ointment (GARAMYCIN) 0.1 % Apply topically 3 (three) times daily. 09/10/17  Yes Gladstone Lighter, MD  glipiZIDE (GLUCOTROL) 5 MG tablet Take  5 mg by mouth daily.   Yes [provider]  lactulose (CHRONULAC) 10 GM/15ML solution Take 30 mLs by mouth every other day.  08/15/17  Yes [provider]  lidocaine-prilocaine (EMLA) cream Apply topically 3 (three) times a week. Apply 45 minutes prior to dialysis treatment three times a week 10/22/17  Yes [provider]  lisinopril (PRINIVIL,ZESTRIL) 10 MG tablet Take 1 tablet by mouth daily. 08/28/17  Yes [provider]  meclizine (ANTIVERT) 25 MG tablet Take 1 tablet (25 mg total) by mouth 3 (three) times daily as needed for dizziness. Patient taking differently: Take 25 mg by mouth daily.  12/08/15  Yes Nance Pear, MD  ondansetron (ZOFRAN) 4 MG tablet Take 1 tablet (4 mg total) by mouth every 6 (six) hours as needed for nausea. 09/17/16  Yes Wieting, Richard, MD  sevelamer carbonate (RENVELA) 800 MG tablet Take 1,600 mg by mouth 3 (three) times daily with meals.   Yes [provider]  calcium acetate (PHOSLO) 667 MG capsule Take 3 capsules (2,001 mg total) by mouth 3 (three) times daily  with meals. Patient not taking: Reported on 12/21/2017 09/10/17   Gladstone Lighter, MD  cetirizine (ZYRTEC) 10 MG tablet Take 10 mg by mouth daily as needed for allergies.    [provider]  GLUCOSAMINE-CHONDROITIN PO Take 4,000 mg by mouth daily.    [provider]  sertraline (ZOLOFT) 100 MG tablet Take 1 tablet by mouth daily. 08/02/17   [provider]      VITAL SIGNS:  Blood pressure (!) 152/63, pulse 90, temperature 99 F (37.2 C), temperature source Oral, resp. rate 20, height 5\' 8"  (1.727 m), weight 88.8 kg, last menstrual period 09/18/2013, SpO2 93 %.  PHYSICAL EXAMINATION:  GENERAL:  57 y.o.-year-old patient lying in the bed with no acute distress.  EYES: Pupils equal, round, reactive to light and accommodation. No scleral icterus. Extraocular muscles intact.  HEENT: Head atraumatic, normocephalic. Oropharynx and  nasopharynx clear.  NECK:  Supple, no jugular venous distention. No thyroid enlargement, no tenderness.  LUNGS: Normal breath sounds bilaterally, no wheezing, rales,rhonchi or crepitation. No use of accessory muscles of respiration.  CARDIOVASCULAR: S1, S2 normal. No murmurs, rubs, or gallops.  ABDOMEN: Soft, nontender, nondistended. Bowel sounds present.   EXTREMITIES: Left shoulder AV graft with good bruit no pedal edema, cyanosis, or clubbing.  NEUROLOGIC: Awake, alert and oriented x3. Sensation intact. Gait not checked.  PSYCHIATRIC: The patient is alert and oriented x 3.  SKIN: No obvious rash, lesion, or ulcer.   LABORATORY PANEL:   CBC Recent Labs  Lab 12/21/17 1321  WBC 13.1*  HGB 7.3*  HCT 22.0*  PLT 299   ------------------------------------------------------------------------------------------------------------------  Chemistries  Recent Labs  Lab 12/21/17 0641 12/21/17 0707  NA  --  128*  K  --  3.3*  CL  --  92*  GLUCOSE  --  71  BUN  --  37*  CREATININE  --  10.70*  MG 1.4*  --   AST 20  --   ALT 10  --   ALKPHOS 78  --   BILITOT 0.8  --    ------------------------------------------------------------------------------------------------------------------  Cardiac Enzymes Recent Labs  Lab 12/21/17 0641  TROPONINI 0.04*   ------------------------------------------------------------------------------------------------------------------  RADIOLOGY:  Dg Chest Portable 1 View  Result Date: 12/21/2017 CLINICAL DATA:  57 year old female with hypoglycemia. Peritoneal dialysis. EXAM: PORTABLE CHEST 1 VIEW COMPARISON:  12/16/2016 and earlier. FINDINGS: Portable AP semi upright view at 0654 hours. Stable lung volumes. Mild chronic cardiomegaly. Chronic retrocardiac/left lung base hypo ventilation appears stable since 2018. Visualized tracheal air column is within normal limits. No pneumothorax, pulmonary edema, or acute pulmonary opacity. No acute osseous  abnormality identified. IMPRESSION: Chronic left lung base hypo-ventilation. No acute cardiopulmonary abnormality. Electronically Signed   By: Genevie Ann M.D.   On: 12/21/2017 07:26    EKG:   Orders placed or performed during the hospital encounter of 12/21/17  . EKG 12-Lead  . EKG 12-Lead    IMPRESSION AND PLAN:  Jacqueline Rodriguez  is a 57 y.o. female with a known history of end-stage renal disease both on peritoneal dialysis and hemodialysis, diabetes mellitus, CHF, GERD, COPD, peripheral vascular disease and other medical problems has been lethargic for the past several days and became altered today.  Husband called EMS and her Accu-Chek was at 42 patient was given 200 cc of D10 and CBG went up to 160 patient is brought into the emergency department as her sugar was persistently low she was started on D10 and hospitalist team is called to admit the patient.  Encephalopathy secondary to hypoglycemia Admit to MedSurg unit On dextrose drip titrate as needed monitor frequent Accu-Cheks Patient has glucometer but ran out of the supplies and not checking her Accu-Cheks for the past several days.  We will put a diabetic coordinator consult Neurochecks   Hypothermia secondary to hypoglycemia On Bair hugger and on insulin drip  End-stage renal disease on peritoneal dialysis and hemodialysis Consult placed to nephrology and discussed with Dr.kolluru Needs.PD today  Diabetes mellitus Currently patient is hypoglycemic and on dextrose drip   Leukocytosis probably reactive we will continue close monitoring  COPD no exacerbation at this time Nebulizer treatments as needed for shortness of breath    All the records are reviewed and case discussed with ED provider. Management plans discussed with the patient, family and they are in agreement.  CODE STATUS: fc   TOTAL TIME TAKING CARE OF THIS PATIENT: 45 minutes.   Note: This dictation was prepared with Dragon dictation along with smaller  phrase technology. Any transcriptional errors that result from this process are unintentional.  Nicholes Mango M.D on 12/21/2017 at 4:12 PM  Between 7am to 6pm - Pager - [phone]  After 6pm go to [email or web address] - password [email or web address] [facility]  Office  [phone]  CC: Primary care physician; Patient, No Pcp Per

## 2017-12-21 NOTE — Progress Notes (Signed)
CBG still low, 28.  Dr Margaretmary Eddy notified and patient to receive a 1/2 amp of dextrose and transfer patient to ICU

## 2017-12-21 NOTE — Progress Notes (Signed)
Pt is hypoglycemic persistently even with D10 gtt, transfer to unit

## 2017-12-22 LAB — CBC
HCT: 22.1 % — ABNORMAL LOW (ref 36.0–46.0)
HCT: 23.5 % — ABNORMAL LOW (ref 36.0–46.0)
HEMOGLOBIN: 7.2 g/dL — AB (ref 12.0–15.0)
Hemoglobin: 7.5 g/dL — ABNORMAL LOW (ref 12.0–15.0)
MCH: 26.1 pg (ref 26.0–34.0)
MCH: 26.5 pg (ref 26.0–34.0)
MCHC: 32.6 g/dL (ref 30.0–36.0)
MCHC: 31.9 g/dL (ref 30.0–36.0)
MCV: 80.1 fL (ref 80.0–100.0)
MCV: 83 fL (ref 80.0–100.0)
NRBC: 0 % (ref 0.0–0.2)
Platelets: 241 10*3/uL (ref 150–400)
Platelets: 250 10*3/uL (ref 150–400)
RBC: 2.76 MIL/uL — ABNORMAL LOW (ref 3.87–5.11)
RBC: 2.83 MIL/uL — ABNORMAL LOW (ref 3.87–5.11)
RDW: 13.3 % (ref 11.5–15.5)
RDW: 13.3 % (ref 11.5–15.5)
WBC: 8.5 10*3/uL (ref 4.0–10.5)
WBC: 7.4 10*3/uL (ref 4.0–10.5)
nRBC: 0 % (ref 0.0–0.2)

## 2017-12-22 LAB — COMPREHENSIVE METABOLIC PANEL
ALT: 10 U/L (ref 0–44)
AST: 19 U/L (ref 15–41)
Albumin: 2.1 g/dL — ABNORMAL LOW (ref 3.5–5.0)
Alkaline Phosphatase: 62 U/L (ref 38–126)
Anion gap: 10 (ref 5–15)
BILIRUBIN TOTAL: 0.7 mg/dL (ref 0.3–1.2)
BUN: 19 mg/dL (ref 6–20)
CO2: 23 mmol/L (ref 22–32)
Calcium: 6.9 mg/dL — ABNORMAL LOW (ref 8.9–10.3)
Chloride: 97 mmol/L — ABNORMAL LOW (ref 98–111)
Creatinine, Ser: 5.56 mg/dL — ABNORMAL HIGH (ref 0.44–1.00)
GFR calc non Af Amer: 8 mL/min — ABNORMAL LOW (ref >60)
GFR, EST AFRICAN AMERICAN: 9 mL/min — AB (ref >60)
Glucose, Bld: 133 mg/dL — ABNORMAL HIGH (ref 70–99)
Potassium: 4.3 mmol/L (ref 3.5–5.1)
Sodium: 130 mmol/L — ABNORMAL LOW (ref 135–145)
TOTAL PROTEIN: 5.1 g/dL — AB (ref 6.5–8.1)

## 2017-12-22 LAB — MAGNESIUM: Magnesium: 1.6 mg/dL — ABNORMAL LOW (ref 1.7–2.4)

## 2017-12-22 LAB — GLUCOSE, CAPILLARY
GLUCOSE-CAPILLARY: 134 mg/dL — AB (ref 70–99)
GLUCOSE-CAPILLARY: 133 mg/dL — AB (ref 70–99)
Glucose-Capillary: 104 mg/dL — ABNORMAL HIGH (ref 70–99)
Glucose-Capillary: 116 mg/dL — ABNORMAL HIGH (ref 70–99)
Glucose-Capillary: 120 mg/dL — ABNORMAL HIGH (ref 70–99)
Glucose-Capillary: 136 mg/dL — ABNORMAL HIGH (ref 70–99)
Glucose-Capillary: 143 mg/dL — ABNORMAL HIGH (ref 70–99)
Glucose-Capillary: 158 mg/dL — ABNORMAL HIGH (ref 70–99)
Glucose-Capillary: 187 mg/dL — ABNORMAL HIGH (ref 70–99)
Glucose-Capillary: 27 mg/dL — CL (ref 70–99)
Glucose-Capillary: 38 mg/dL — CL (ref 70–99)

## 2017-12-22 LAB — HEPATITIS B SURFACE ANTIGEN: Hepatitis B Surface Ag: NEGATIVE

## 2017-12-22 LAB — HEPATITIS B CORE ANTIBODY, TOTAL: Hep B Core Total Ab: NEGATIVE

## 2017-12-22 LAB — HIV ANTIBODY (ROUTINE TESTING W REFLEX): HIV Screen 4th Generation wRfx: NONREACTIVE

## 2017-12-22 LAB — PROCALCITONIN: Procalcitonin: 0.36 ng/mL

## 2017-12-22 LAB — HEPATITIS B SURFACE ANTIBODY,QUALITATIVE: Hep B S Ab: NONREACTIVE

## 2017-12-22 LAB — PHOSPHORUS: Phosphorus: 3.4 mg/dL (ref 2.5–4.6)

## 2017-12-22 MED ORDER — GABAPENTIN 100 MG PO CAPS
100.0000 mg | ORAL_CAPSULE | Freq: Three times a day (TID) | ORAL | Status: DC | PRN
  Administered 2017-12-22 (×2): 100 mg via ORAL
  Filled 2017-12-22 (×2): qty 1

## 2017-12-22 MED ORDER — ACETAMINOPHEN 325 MG PO TABS
650.0000 mg | ORAL_TABLET | Freq: Four times a day (QID) | ORAL | Status: DC | PRN
  Administered 2017-12-22 – 2017-12-23 (×3): 650 mg via ORAL
  Filled 2017-12-22 (×2): qty 2

## 2017-12-22 MED ORDER — POTASSIUM CHLORIDE 10 MEQ/100ML IV SOLN
10.0000 meq | INTRAVENOUS | Status: AC
  Administered 2017-12-22 (×4): 10 meq via INTRAVENOUS
  Filled 2017-12-22 (×4): qty 100

## 2017-12-22 MED ORDER — GABAPENTIN 100 MG PO CAPS
ORAL_CAPSULE | ORAL | Status: AC
  Filled 2017-12-22: qty 1

## 2017-12-22 MED ORDER — OXYCODONE HCL 5 MG PO TABS
5.0000 mg | ORAL_TABLET | Freq: Once | ORAL | Status: AC
  Administered 2017-12-22: 5 mg via ORAL
  Filled 2017-12-22: qty 1

## 2017-12-22 MED ORDER — MAGNESIUM OXIDE 400 (241.3 MG) MG PO TABS
400.0000 mg | ORAL_TABLET | Freq: Every day | ORAL | Status: DC
  Administered 2017-12-22: 400 mg via ORAL
  Filled 2017-12-22: qty 1

## 2017-12-22 NOTE — Progress Notes (Signed)
Pre HD Treatment    12/22/17 1645  Vital Signs  Temp 97.7 F (36.5 C)  Temp Source Oral  Pulse Rate 87  Pulse Rate Source Monitor  Resp 16  BP (!) 151/90  BP Location Right Arm  BP Method Automatic  Patient Position (if appropriate) Lying  Oxygen Therapy  SpO2 100 %  O2 Device Room Air  Pain Assessment  Pain Scale 0-10  Pain Score 10  Pain Type Acute pain  Pain Location Back  Pain Orientation Mid  Pain Descriptors / Indicators Aching  Pain Frequency Constant  Pain Onset With Activity  Patients Stated Pain Goal 0  Pain Intervention(s) Medication (See eMAR);Repositioned  Multiple Pain Sites No  Dialysis Weight  Weight 93.1 kg  Type of Weight Pre-Dialysis  Time-Out for Hemodialysis  What Procedure? HD  Pt Identifiers(min of two) First/Last Name;MRN/Account#;Pt's DOB(use if MRN/Acct# not available  Correct Site? Yes  Correct Side? Yes  Correct Procedure? Yes  Consents Verified? Yes  Rad Studies Available? N/A  Safety Precautions Reviewed? Yes  Engineer, civil (consulting) Number 4  Station Number 1  UF/Alarm Test Passed  Conductivity: Meter 14  Conductivity: Machine  14  pH 7.3  Reverse Osmosis Main  Normal Saline Lot Number [id number]  Dialyzer Lot Number [id number]  Disposable Set Lot Number [id number]  Machine Temperature 98.6 F (37 C)  Musician and Audible Yes  Blood Lines Intact and Secured Yes  Pre Treatment Patient Checks  Vascular access used during treatment Fistula  Hepatitis B Surface Antigen Results Negative  Date Hepatitis B Surface Antigen Drawn 12/21/17  Hepatitis B Surface Antibody  (<10)  Date Hepatitis B Surface Antibody Drawn 12/21/17  Hemodialysis Consent Verified Yes  Hemodialysis Standing Orders Initiated Yes  ECG (Telemetry) Monitor On Yes  Prime Ordered Normal Saline  Length of  DialysisTreatment -hour(s) 3.5 Hour(s)  Dialysis Treatment Comments Na 140  Dialyzer Elisio 17H NR  Dialysate 3K, 2.5 Ca  Dialysis Anticoagulant None   Dialysate Flow Ordered 600  Blood Flow Rate Ordered 300 mL/min  Ultrafiltration Goal 0.5 Liters  Dialysis Blood Pressure Support Ordered Normal Saline  Education / Care Plan  Dialysis Education Provided Yes  Documented Education in Care Plan Yes  Fistula / Graft Left Upper arm Arteriovenous fistula  Placement Date/Time: 10/01/15 1110   Placed prior to admission: No  Orientation: Left  Access Location: Upper arm  Access Type: Arteriovenous fistula  Site Condition No complications  Fistula / Graft Assessment Present;Thrill;Bruit  Drainage Description None

## 2017-12-22 NOTE — Progress Notes (Signed)
HD Treatment Initiated    12/22/17 1715  Vital Signs  Pulse Rate Source Monitor  Resp 16  BP 134/64  BP Location Right Arm  BP Method Automatic  Patient Position (if appropriate) Lying  Oxygen Therapy  SpO2 100 %  O2 Device Room Air  During Hemodialysis Assessment  Blood Flow Rate (mL/min) 300 mL/min  Arterial Pressure (mmHg) -210 mmHg  Venous Pressure (mmHg) 150 mmHg  Transmembrane Pressure (mmHg) 60 mmHg  Ultrafiltration Rate (mL/min) 290 mL/min  Dialysate Flow Rate (mL/min) 600 ml/min  Conductivity: Machine  14  HD Safety Checks Performed Yes  Intra-Hemodialysis Comments Progressing as prescribed (UF 58)

## 2017-12-22 NOTE — Plan of Care (Signed)
Pt refuses entry/exit site care to PD catheter at this time. She signed out AMA and states she wants to go see her husband.

## 2017-12-22 NOTE — Care Management Note (Signed)
Case Management Note  Patient Details  Name: Jacqueline Rodriguez MRN: [id number] Date of Birth: 1960-05-30  Subjective/Objective:       RNCM consult for Hemodialysis at discharge.  [name] with patient pathways is working on getting OP dialysis set up.  Patient has previously been on HD outpatient.               Action/Plan:   Expected Discharge Date:                  Expected Discharge Plan:     In-House Referral:     Discharge planning Services     Post Acute Care Choice:    Choice offered to:     DME Arranged:    DME Agency:     HH Arranged:    HH Agency:     Status of Service:     If discussed at [employer] of Stay Meetings, dates discussed:    Additional Comments:  [name], RN 12/22/2017, 11:30 AM

## 2017-12-22 NOTE — Progress Notes (Signed)
HD Post Assessment    12/22/17 1950  Neurological  Level of Consciousness Alert  Orientation Level Oriented X4  Respiratory  Respiratory Pattern Regular;Unlabored;Symmetrical  Chest Assessment Chest expansion symmetrical  Bilateral Breath Sounds Clear  Cardiac  Pulse Regular  Heart Sounds Murmur  Jugular Venous Distention (JVD) No  ECG Monitor Yes  Cardiac Rhythm NSR  Vascular  R Radial Pulse +2  L Radial Pulse +2  R Dorsalis Pedis Pulse +2  L Dorsalis Pedis Pulse +2  Integumentary  Integumentary (WDL) X  Skin Color Appropriate for ethnicity  Skin Condition Dry  Skin Integrity Ecchymosis  Musculoskeletal  Musculoskeletal (WDL) WDL  GU Assessment  Genitourinary (WDL) X (HD pt )  Peritoneal Catheter Left lower abdomen  Placement Date/Time: (c) 12/21/17 1837   Catheter Location: Left lower abdomen  Site Assessment Clean;Dry;Intact  Drainage Description None  Dressing Gauze/Drain sponge  Dressing Status Clean;Dry;Intact  Psychosocial  Psychosocial (WDL) X  Patient Behaviors Irritable  Needs Expressed Physical  Emotional support given Given to patient

## 2017-12-22 NOTE — Progress Notes (Signed)
HD Treatment Complete/AMA    12/22/17 1945  Vital Signs  Pulse Rate 91  Pulse Rate Source Monitor  Resp 16  BP (!) 148/111  BP Location Right Arm  BP Method Automatic  Patient Position (if appropriate) Lying  Oxygen Therapy  SpO2 98 %  O2 Device Room Air  During Hemodialysis Assessment  Blood Flow Rate (mL/min) 300 mL/min  Arterial Pressure (mmHg) -240 mmHg  Venous Pressure (mmHg) 160 mmHg  Transmembrane Pressure (mmHg) 50 mmHg  Ultrafiltration Rate (mL/min) 430 mL/min  Dialysate Flow Rate (mL/min) 600 ml/min  Conductivity: Machine  15  HD Safety Checks Performed Yes  Intra-Hemodialysis Comments Tx completed (UF 787; Pt signed off AMA)  Fistula / Graft Left Upper arm Arteriovenous fistula  Placement Date/Time: 10/01/15 1110   Placed prior to admission: No  Orientation: Left  Access Location: Upper arm  Access Type: Arteriovenous fistula  Status Deaccessed

## 2017-12-22 NOTE — Consult Note (Signed)
PULMONARY / CRITICAL CARE MEDICINE  Name: Jacqueline Rodriguez MRN: [id number] DOB: 1960/04/22    LOS: 1  Referring Provider: Dr. Margaretmary Eddy Reason for Referral: Hypoglycemia Brief patient description: 57 year old female with end-stage renal disease on hemodialysis, who missed hemodialysis for about 2 months, reported to be on peritoneal dialysis, who was transferred to the ICU after a  rapid response for hypoglycemia due to glipizide  HPI: 57 year old female with a medical history as indicated below, noncompliant with hemodialysis, also on peritoneal dialysis who was admitted with altered mental status and found to be hypoglycemic.  She was admitted to the floor and upon arriving on the floor she was hypothermic with a temperature of 93.6 but then developed refractory hypoglycemia.  She was given multiple amps of dextrose and transferred to the ICU for further management.  She was also found to be hyponatremic and hypokalemic.  Her creatinine at time of presentation was 10.70.  Her calcium was extremely low at 1.86 and her magnesium was 1.4 At time of assessment patient is awake and complaining of lower extremity pain and requesting if she can go home in the morning.  She denies chest pain palpitations, dizziness and headache.  Her last blood sugar was 143.  Past Medical History:  Diagnosis Date  . Anemia   . Chronic bronchitis ([place])   . Chronic diastolic CHF (congestive heart failure) ([place])   . CKD (chronic kidney disease), stage III ([place])   . COPD (chronic obstructive pulmonary disease) ([place])   . Diabetes mellitus with renal complications ([place])   . Essential hypertension   . GERD (gastroesophageal reflux disease)   . Obesity   . Peripheral vascular disease ([place])   . Pulmonary hypertension ([place])   . Renal insufficiency 09/19/2015   Stage 3 CKD. Beginning dialysis.   Past Surgical History:  Procedure Laterality Date  . A/V FISTULAGRAM N/A 09/16/2016   Procedure: A/V Fistulagram;  Surgeon: Algernon Huxley, MD;  Location: [facility];  Service: Cardiovascular;  Laterality: N/A;  . AV FISTULA PLACEMENT Left 10/01/2015   Procedure: ARTERIOVENOUS (AV) FISTULA CREATION ( BRACHIAL CEPHALIC );  Surgeon: Algernon Huxley, MD;  Location: [facility];  Service: Vascular;  Laterality: Left;  . CAPD INSERTION N/A 06/10/2016   Procedure: LAPAROSCOPIC INSERTION CONTINUOUS AMBULATORY PERITONEAL DIALYSIS  (CAPD) CATHETER;  Surgeon: Algernon Huxley, MD;  Location: [facility];  Service: Vascular;  Laterality: N/A;  . DIALYSIS/PERMA CATHETER INSERTION  09/16/2016   Procedure: DIALYSIS/PERMA CATHETER INSERTION;  Surgeon: Algernon Huxley, MD;  Location: [facility];  Service: Cardiovascular;;  . DIALYSIS/PERMA CATHETER REMOVAL N/A 12/06/2016   Procedure: DIALYSIS/PERMA CATHETER REMOVAL;  Surgeon: Algernon Huxley, MD;  Location: [facility];  Service: Cardiovascular;  Laterality: N/A;  . PERIPHERAL VASCULAR CATHETERIZATION N/A 07/21/2015   Procedure: Dialysis/Perma Catheter;  Surgeon: Algernon Huxley, MD;  Location: [facility];  Service: Cardiovascular;  Laterality: N/A;  . PERIPHERAL VASCULAR CATHETERIZATION N/A 12/11/2015   Procedure: Dialysis/Perma Catheter Removal;  Surgeon: Algernon Huxley, MD;  Location: [facility];  Service: Cardiovascular;  Laterality: N/A;   Prior to Admission medications   Medication Sig Start Date End Date Taking? Authorizing Provider  amLODipine (NORVASC) 5 MG tablet Take 5 mg by mouth daily.   Yes [provider]  clopidogrel (PLAVIX) 75 MG tablet Take 75 mg by mouth daily.   Yes [provider]  donepezil (ARICEPT) 5 MG tablet Take 1 tablet (5 mg total) by  mouth at bedtime. 09/05/17 10/15/17 Yes Sowles, Drue Stager, MD  empagliflozin (JARDIANCE) 25 MG TABS tablet Take 25 mg by mouth daily.   Yes [provider]  glycopyrrolate (ROBINUL) 1 MG tablet Take 1 mg by mouth 2 (two) times daily.   Yes [provider]  insulin aspart (NOVOLOG  FLEXPEN) 100 UNIT/ML FlexPen Inject 12 Units into the skin 2 (two) times daily.   Yes [provider]  insulin aspart (NOVOLOG) 100 UNIT/ML FlexPen Inject 18 Units into the skin daily. At 1700   Yes [provider]  Insulin Degludec-Liraglutide (XULTOPHY) 100-3.6 UNIT-MG/ML SOPN Inject 50 Units into the skin daily.   Yes [provider]  levETIRAcetam (KEPPRA) 500 MG tablet Take 500 mg by mouth 2 (two) times daily.   Yes [provider]  lipase/protease/amylase (CREON) 12000 units CPEP capsule Take 6,000 Units by mouth 3 (three) times daily before meals.   Yes [provider]  lipase/protease/amylase (CREON) 12000 units CPEP capsule Take 3,000 Units by mouth at bedtime. With snack   Yes [provider]  lisinopril (PRINIVIL,ZESTRIL) 5 MG tablet Take 5 mg by mouth daily.   Yes [provider]  metoprolol succinate (TOPROL-XL) 25 MG 24 hr tablet Take 1 tablet (25 mg total) by mouth daily. 09/05/17  Yes Sowles, Drue Stager, MD  rosuvastatin (CRESTOR) 40 MG tablet Take 1 tablet (40 mg total) by mouth daily. 09/05/17 10/15/17 Yes Steele Sizer, MD  aspirin EC 81 MG tablet Take 81 mg by mouth daily.    [provider]  famotidine (PEPCID) 20 MG tablet Take 1 tablet (20 mg total) by mouth 2 (two) times daily. 09/05/17 10/05/17  Steele Sizer, MD  gabapentin (NEURONTIN) 300 MG capsule Take 1 capsule (300 mg total) by mouth 2 (two) times daily. 09/05/17 10/05/17  Steele Sizer, MD  insulin glargine (LANTUS) 100 UNIT/ML injection Inject 0.1 mLs (10 Units total) into the skin daily. 09/05/17 10/05/17  Steele Sizer, MD  lacosamide 100 MG TABS Take 1 tablet (100 mg total) by mouth 2 (two) times daily. Patient not taking: Reported on 10/15/2017 01/21/17   Fritzi Mandes, MD  promethazine (PHENERGAN) 12.5 MG tablet Take 1 tablet (12.5 mg total) by mouth every 6 (six) hours as needed for nausea or vomiting. Patient not taking: Reported on 10/15/2017  11/09/16   Stark Klein, MD  sertraline (ZOLOFT) 25 MG tablet Take 1 tablet (25 mg total) by mouth daily. Patient not taking: Reported on 10/15/2017 09/05/17   Steele Sizer, MD   Allergies Allergies  Allergen Reactions  . Ambien [Zolpidem] Other (See Comments)    hallucinations    Family History Family History  Problem Relation Age of Onset  . Hypertension Mother   . Diabetes Mellitus II Mother   . CAD Father   . Hypertension Father   . Heart attack Father    Social History  reports that she quit smoking about 32 years ago. Her smoking use included cigarettes. She has a 2.50 pack-year smoking history. She has never used smokeless tobacco. She reports that she does not drink alcohol or use drugs.  Review Of Systems:   Constitutional: Negative for fever and chills but positive for generalized malaise.  HENT: Negative for congestion and rhinorrhea.  Eyes: Negative for redness and visual disturbance.  Respiratory: Negative for shortness of breath and wheezing.  Cardiovascular: Negative for chest pain and palpitations.  Gastrointestinal: Negative  for nausea , vomiting and abdominal pain and  Loose stools Genitourinary: Negative for dysuria and urgency.  Endocrine: Denies polyuria, polyphagia and heat intolerance Musculoskeletal: Negative for myalgias and arthralgias.  Skin: Negative for pallor and wound.  Neurological: Negative for dizziness but positive for intermittent headaches and neuropathic pain  VITAL SIGNS: BP (!) 163/88 (BP Location: Right Arm)   Pulse 93   Temp 98.8 F (37.1 C) (Oral)   Resp 14   Ht 5\' 8"  (1.727 m)   Wt 88.7 kg   LMP 09/18/2013 (Approximate) Comment: years ago per pt prior to 2015  SpO2 99%   BMI 29.73 kg/m   HEMODYNAMICS:    VENTILATOR SETTINGS:    INTAKE / OUTPUT: I/O last 3 completed shifts: In: 696.5 [I.V.:696.5] Out: -   PHYSICAL EXAMINATION: General: Lying in bed, in no acute distress, requesting her evening meds HEENT:  PERRLA, no JVD Neuro: Alert and oriented x4, no focal deficits Cardiovascular:  RRR, +2 pulses bilaterally Lungs: Clear to auscultation bilaterally, diminished in the bases Abdomen: Nondistended, positive bowel sounds Musculoskeletal: No deformities, positive range of motion Skin: Warm and dry  LABS:  BMET Recent Labs  Lab 12/21/17 0707 12/21/17 1738 12/21/17 2050  NA 128*  --  131*  K 3.3*  --  3.3*  CL 92*  --  95*  CO2  --   --  24  BUN 37*  --  28*  CREATININE 10.70*  --  7.13*  GLUCOSE 71 55* 76    Electrolytes Recent Labs  Lab 12/21/17 0641 12/21/17 2050  CALCIUM  --  6.8*  MG 1.4*  --   PHOS  --  4.3    CBC Recent Labs  Lab 12/21/17 0707 12/21/17 0804 12/21/17 1321  WBC  --  14.9* 13.1*  HGB 8.8* 8.1* 7.3*  HCT 26.0* 25.2* 22.0*  PLT  --  337 299    Coag's No results for input(s): APTT, INR in the last 168 hours.  Sepsis Markers Recent Labs  Lab 12/21/17 0708 12/21/17 1321 12/21/17 1541  LATICACIDVEN 1.07 1.2 1.1  PROCALCITON  --  0.34  --     ABG No results for input(s): PHART, PCO2ART, PO2ART in the last 168 hours.  Liver Enzymes Recent Labs  Lab 12/21/17 0641 12/21/17 2050  AST 20  --   ALT 10  --   ALKPHOS 78  --   BILITOT 0.8  --   ALBUMIN 2.4* 2.0*    Cardiac Enzymes Recent Labs  Lab 12/21/17 0641  TROPONINI 0.04*    Glucose Recent Labs  Lab 12/21/17 2359 12/22/17 0056 12/22/17 0204 12/22/17 0256 12/22/17 0358 12/22/17 0456  GLUCAP 143* 104* 134* 120* 133* 136*    Imaging Dg Chest Portable 1 View  Result Date: 12/21/2017 CLINICAL DATA:  57 year old female with hypoglycemia. Peritoneal dialysis. EXAM: PORTABLE CHEST 1 VIEW COMPARISON:  12/16/2016 and earlier. FINDINGS: Portable AP semi upright view at 0654 hours. Stable lung volumes. Mild chronic cardiomegaly. Chronic retrocardiac/left lung base hypo ventilation appears stable since 2018. Visualized tracheal air column is within normal limits. No  pneumothorax, pulmonary edema, or acute pulmonary opacity. No acute osseous abnormality identified. IMPRESSION: Chronic left lung base hypo-ventilation. No acute cardiopulmonary abnormality. Electronically Signed   By: Genevie Ann M.D.   On: 12/21/2017 07:26    ASSESSMENT / PLAN: Refractory hypoglycemia secondary to medication side effects: Discontinue glipizide and all oral diabetic agents.  Once blood glucose is stable, resume glycemic control with sliding scale insulin starting with a sensitive scale.  Continue to give as needed dextrose 50% for blood sugar levels less  than 70 and D10 at 125 mL's per hour to keep blood glucose levels greater than 100  Acute encephalopathy secondary to hypoglycemia: Resolved  Hypothermia-resolved  End-stage renal disease on peritoneal and hemodialysis: Follow-up with nephrology  Type 2 diabetes: Continue to hold all medications  If blood sugar levels are stable in the morning, patient can be transferred out of the ICU  Best Practice: Code Status: Full code Diet: Carb modified diet GI prophylaxis: Not indicated VTE prophylaxis: Subcu heparin  FAMILY  - Updates: No family at bedside.  Will update when available.  Patient updated on current treatment plan.  Tempie Gibeault [facility] ANP-BC Pulmonary and Critical Care Medicine [facility] Pager [phone] or [phone]  NB: This document was prepared using Dragon voice recognition software and may include unintentional dictation errors.    12/22/2017, 5:57 AM

## 2017-12-22 NOTE — Progress Notes (Signed)
[facility]  ROUNDING NOTE   Subjective:   Transferred to ICU yesterday due to hypoglycemia. Placed on protocol and D10W infusion. Currently refusing finger stick glucose sticks.  Hemodialysis treatment yesterday. 3K bath. Also received. IV KCL 63mEq overnight.   Patient is upset and wants to go home.    Objective:  Vital signs in last 24 hours:  Temp:  [96.3 F (35.7 C)-99.1 F (37.3 C)] 97.9 F (36.6 C) (12/12 0800) Pulse Rate:  [85-98] 98 (12/12 0800) Resp:  [12-20] 20 (12/12 0800) BP: (134-174)/(50-92) 160/55 (12/12 0800) SpO2:  [93 %-100 %] 98 % (12/12 0800) Weight:  [88.2 kg-88.8 kg] 88.7 kg (12/11 2316)  Weight change: 3.977 kg Filed Weights   12/21/17 1834 12/21/17 1935 12/21/17 2316  Weight: 88.2 kg 88.5 kg 88.7 kg    Intake/Output: I/O last 3 completed shifts: In: 2037.7 [I.V.:1805.2; IV Piggyback:232.5] Out: 11 [Other:11]   Intake/Output this shift:  No intake/output data recorded.  Physical Exam: General: NAD,   Head: Normocephalic, atraumatic. Moist oral mucosal membranes  Eyes: Anicteric, PERRL  Neck: Supple, trachea midline  Lungs:  Clear to auscultation  Heart: Regular rate and rhythm  Abdomen:  Soft, nontender,   Extremities:  no peripheral edema.  Neurologic: Nonfocal, moving all four extremities  Skin: No lesions  Access: Left AVF, PD catheter    Basic Metabolic Panel: Recent Labs  Lab 12/21/17 0641 12/21/17 0707 12/21/17 1738 12/21/17 2050 12/22/17 0512  NA  --  128*  --  131* 130*  K  --  3.3*  --  3.3* 4.3  CL  --  92*  --  95* 97*  CO2  --   --   --  24 23  GLUCOSE  --  71 55* 76 133*  BUN  --  37*  --  28* 19  CREATININE  --  10.70*  --  7.13* 5.56*  CALCIUM  --   --   --  6.8* 6.9*  MG 1.4*  --   --   --  1.6*  PHOS  --   --   --  4.3 3.4    Liver Function Tests: Recent Labs  Lab 12/21/17 0641 12/21/17 2050 12/22/17 0512  AST 20  --  19  ALT 10  --  10  ALKPHOS 78  --  62  BILITOT 0.8  --  0.7   PROT 6.2*  --  5.1*  ALBUMIN 2.4* 2.0* 2.1*   Recent Labs  Lab 12/21/17 0641  LIPASE 30   No results for input(s): AMMONIA in the last 168 hours.  CBC: Recent Labs  Lab 12/21/17 0707 12/21/17 0804 12/21/17 1321 12/22/17 0639  WBC  --  14.9* 13.1* 8.5  NEUTROABS  --  13.6*  --   --   HGB 8.8* 8.1* 7.3* 7.2*  HCT 26.0* 25.2* 22.0* 22.1*  MCV  --  81.6 80.3 80.1  PLT  --  337 299 241    Cardiac Enzymes: Recent Labs  Lab 12/21/17 0641  TROPONINI 0.04*    BNP: Invalid input(s): POCBNP  CBG: Recent Labs  Lab 12/22/17 0056 12/22/17 0204 12/22/17 0256 12/22/17 0358 12/22/17 0456  GLUCAP 104* 134* 120* 133* 136*    Microbiology: Results for orders placed or performed during the hospital encounter of 12/21/17  CULTURE, BLOOD (ROUTINE X 2) w Reflex to ID Panel     Status: None (Preliminary result)   Collection Time: 12/21/17  1:21 PM  Result Value Ref Range Status  Specimen Description BLOOD RIGHT ANTECUBITAL  Final   Special Requests   Final    BOTTLES DRAWN AEROBIC AND ANAEROBIC Blood Culture adequate volume   Culture   Final    NO GROWTH < 24 HOURS Performed at [facility], [address] [place], [place] [address]    Report Status PENDING  Incomplete  CULTURE, BLOOD (ROUTINE X 2) w Reflex to ID Panel     Status: None (Preliminary result)   Collection Time: 12/21/17  1:39 PM  Result Value Ref Range Status   Specimen Description BLOOD RIGHT HAND  Final   Special Requests   Final    BOTTLES DRAWN AEROBIC AND ANAEROBIC Blood Culture adequate volume   Culture   Final    NO GROWTH < 24 HOURS Performed at [facility], [address], [place], [place] [address]    Report Status PENDING  Incomplete  MRSA PCR Screening     Status: None   Collection Time: 12/21/17  6:34 PM  Result Value Ref Range Status   MRSA by PCR NEGATIVE NEGATIVE Final    Comment:        The GeneXpert MRSA Assay (FDA approved for NASAL specimens only), is one  component of a comprehensive MRSA colonization surveillance program. It is not intended to diagnose MRSA infection nor to guide or monitor treatment for MRSA infections. Performed at [facility], [address] [place], [place] [address]     Coagulation Studies: No results for input(s): LABPROT, INR in the last 72 hours.  Urinalysis: No results for input(s): COLORURINE, LABSPEC, PHURINE, GLUCOSEU, HGBUR, BILIRUBINUR, KETONESUR, PROTEINUR, UROBILINOGEN, NITRITE, LEUKOCYTESUR in the last 72 hours.  Invalid input(s): APPERANCEUR    Imaging: Dg Chest Portable 1 View  Result Date: 12/21/2017 CLINICAL DATA:  57 year old female with hypoglycemia. Peritoneal dialysis. EXAM: PORTABLE CHEST 1 VIEW COMPARISON:  12/16/2016 and earlier. FINDINGS: Portable AP semi upright view at 0654 hours. Stable lung volumes. Mild chronic cardiomegaly. Chronic retrocardiac/left lung base hypo ventilation appears stable since 2018. Visualized tracheal air column is within normal limits. No pneumothorax, pulmonary edema, or acute pulmonary opacity. No acute osseous abnormality identified. IMPRESSION: Chronic left lung base hypo-ventilation. No acute cardiopulmonary abnormality. Electronically Signed   By: Genevie Ann M.D.   On: 12/21/2017 07:26     Medications:   . dextrose 125 mL/hr at 12/22/17 0945   . dextrose      . dextrose      . epoetin (EPOGEN/PROCRIT) injection  10,000 Units Intravenous Q T,Th,Sa-HD  . heparin injection (subcutaneous)  5,000 Units Subcutaneous Q8H  . magnesium oxide  400 mg Oral Daily   acetaminophen, gabapentin, glucagon (human recombinant), ondansetron **OR** ondansetron (ZOFRAN) IV  Assessment/ Plan:  Jacqueline Rodriguez is a 57 y.o. white female with end stage renal disease on hemodialysis, pulmonary hypertension, peripheral vascular disease, GERD, diabetes mellitus type II, hypertension, COPD  TTS [facility] Left AVF  1. End Stage Renal Disease: has  missed over 2 months of hemodialysis. Claims to be doing peritoneal dialysis at home.  - reinitiated hemodialysis yesterday 12/11. 16ga needles. Tolerated treatment well.  - Dialysis again for today, resume TTS schedule. Orders prepared. 4K bath.  - outpatient planning for [facility]. To follow with my partner, Dr. Murlean Iba. Patient agrees with plan. Agrees to a Scientific laboratory technician. Patient wants to do peritoneal dialysis however will need to show compliance.  - Monitor potassium and magnesium. PO magnesium ordered.   2.  Hypertension:  Home regimen of amlodipine, clonidine, furosemide, lisinopril.  - holding home blood pressure medications.   3. Diabetes mellitus type II with chronic kidney disease: with hypoglycemia. On glipizide at home. Do not recommend restarting this.   4. Anemia of chronic kidney disease: hemoglobin 7.2. Has not gotten EPO in 2 months - EPO with HD treatment  5. Secondary Hyperparathyroidism with hypocalcemia: corrected calcium of 8.4. Phosphorus at goal. Not currently on binders.    LOS: 1 Demir [place] 12/12/20199:49 AM

## 2017-12-22 NOTE — Progress Notes (Signed)
MEDICATION RELATED CONSULT NOTE - INITIAL   Pharmacy Consult for Potassium chloride  Indication: electrolytes   Allergies  Allergen Reactions  . Ambien [Zolpidem] Other (See Comments)    hallucinations    Patient Measurements: Height: 5\' 8"  (172.7 cm) Weight: 195 lb 8.8 oz (88.7 kg) IBW/kg (Calculated) : 63.9 Adjusted Body Weight:   Vital Signs: Temp: 98.4 F (36.9 C) (12/11 2316) Temp Source: Oral (12/11 2316) BP: 146/53 (12/12 0000) Pulse Rate: 89 (12/12 0000) Intake/Output from previous day: 12/11 0701 - 12/12 0700 In: 1396.1 [I.V.:1396.1] Out: 11  Intake/Output from this shift: Total I/O In: 699.6 [I.V.:699.6] Out: 11 [Other:11]  Labs: Recent Labs    12/21/17 0641 12/21/17 0707 12/21/17 0804 12/21/17 1321 12/21/17 2050  WBC  --   --  14.9* 13.1*  --   HGB  --  8.8* 8.1* 7.3*  --   HCT  --  26.0* 25.2* 22.0*  --   PLT  --   --  337 299  --   CREATININE  --  10.70*  --   --  7.13*  MG 1.4*  --   --   --   --   PHOS  --   --   --   --  4.3  ALBUMIN 2.4*  --   --   --  2.0*  PROT 6.2*  --   --   --   --   AST 20  --   --   --   --   ALT 10  --   --   --   --   ALKPHOS 78  --   --   --   --   BILITOT 0.8  --   --   --   --   BILIDIR 0.2  --   --   --   --   IBILI 0.6  --   --   --   --    Estimated Creatinine Clearance: 9.9 mL/min (A) (by C-G formula based on SCr of 7.13 mg/dL (H)).   Microbiology: Recent Results (from the past 720 hour(s))  MRSA PCR Screening     Status: None   Collection Time: 12/21/17  6:34 PM  Result Value Ref Range Status   MRSA by PCR NEGATIVE NEGATIVE Final    Comment:        The GeneXpert MRSA Assay (FDA approved for NASAL specimens only), is one component of a comprehensive MRSA colonization surveillance program. It is not intended to diagnose MRSA infection nor to guide or monitor treatment for MRSA infections. Performed at [facility], [address], [place], [place] [address]     Medical  History: Past Medical History:  Diagnosis Date  . Anemia   . Chronic bronchitis ([place])   . Chronic diastolic CHF (congestive heart failure) ([place])   . CKD (chronic kidney disease), stage III ([place])   . COPD (chronic obstructive pulmonary disease) ([place])   . Diabetes mellitus with renal complications ([place])   . Essential hypertension   . GERD (gastroesophageal reflux disease)   . Obesity   . Peripheral vascular disease ([place])   . Pulmonary hypertension ([place])   . Renal insufficiency 09/19/2015   Stage 3 CKD. Beginning dialysis.    Medications:  Medications Prior to Admission  Medication Sig Dispense Refill Last Dose  . acetaminophen (TYLENOL) 500 MG tablet Take 500 mg by mouth every 6 (six) hours as needed.   prn at prn  . albuterol (PROVENTIL  HFA;VENTOLIN HFA) 108 (90 Base) MCG/ACT inhaler Inhale 2 puffs into the lungs every 6 (six) hours as needed for wheezing or shortness of breath. 1 Inhaler 2 prnb at prn  . amLODipine (NORVASC) 5 MG tablet Take 5 mg by mouth daily.  6 12/20/2017 at 1500  . aspirin EC 81 MG EC tablet Take 1 tablet (81 mg total) by mouth daily. (Patient taking differently: Take 162 mg by mouth at bedtime. ) 30 tablet 0 12/19/2017 at 2100  . cloNIDine (CATAPRES) 0.2 MG tablet Take 0.2 mg by mouth 2 (two) times daily.  5 12/20/2017 at 2100  . furosemide (LASIX) 40 MG tablet Take 40 mg by mouth daily.  3 12/20/2017 at 1500  . gabapentin (NEURONTIN) 100 MG capsule Take 100 mg by mouth 3 (three) times daily as needed. For neuropathy. (patient usually only takes one capsule a day)  2 12/20/2017 at 1500  . gentamicin ointment (GARAMYCIN) 0.1 % Apply topically 3 (three) times daily. 15 g 0 Past Week at Unknown time  . glipiZIDE (GLUCOTROL) 5 MG tablet Take 5 mg by mouth daily.   12/20/2017 at 1500  . lactulose (CHRONULAC) 10 GM/15ML solution Take 30 mLs by mouth every other day.   3 Past Week at Unknown time  . lidocaine-prilocaine (EMLA) cream Apply topically 3 (three) times a  week. Apply 45 minutes prior to dialysis treatment three times a week  12 Past Week at Unknown time  . lisinopril (PRINIVIL,ZESTRIL) 10 MG tablet Take 1 tablet by mouth daily.   12/20/2017 at 1500  . meclizine (ANTIVERT) 25 MG tablet Take 1 tablet (25 mg total) by mouth 3 (three) times daily as needed for dizziness. (Patient taking differently: Take 25 mg by mouth daily. ) 30 tablet 0 prn at prn  . ondansetron (ZOFRAN) 4 MG tablet Take 1 tablet (4 mg total) by mouth every 6 (six) hours as needed for nausea. 20 tablet 0 prn at prn  . sevelamer carbonate (RENVELA) 800 MG tablet Take 1,600 mg by mouth 3 (three) times daily with meals.   12/20/2017 at 1800  . calcium acetate (PHOSLO) 667 MG capsule Take 3 capsules (2,001 mg total) by mouth 3 (three) times daily with meals. (Patient not taking: Reported on 12/21/2017) 270 capsule 0 Not Taking at Unknown time  . cetirizine (ZYRTEC) 10 MG tablet Take 10 mg by mouth daily as needed for allergies.   Not Taking at Unknown time  . GLUCOSAMINE-CHONDROITIN PO Take 4,000 mg by mouth daily.   Not Taking at Unknown time  . sertraline (ZOLOFT) 100 MG tablet Take 1 tablet by mouth daily.   prn at prn    Assessment: 12/11:  K @ 20:50 = 3.3  Goal of Therapy:  Electrolytes within normal limits   Plan:  Will order KCl 10 mEq IV X 4 to start on 12/12 @ 00:30.   Will recheck electrolytes on 12/12 @ 0600.   Lois Ostrom D 12/22/2017,12:14 AM

## 2017-12-22 NOTE — Progress Notes (Signed)
[place] [facility] [place] NAME: Jacqueline Rodriguez    MR#:  [id number]  DATE OF BIRTH:  April 21, 1960  SUBJECTIVE:  CHIEF COMPLAINT: Patient is feeling better, still on D10  REVIEW OF SYSTEMS:  CONSTITUTIONAL: No fever, fatigue or weakness.  EYES: No blurred or double vision.  EARS, NOSE, AND THROAT: No tinnitus or ear pain.  RESPIRATORY: No cough, shortness of breath, wheezing or hemoptysis.  CARDIOVASCULAR: No chest pain, orthopnea, edema.  GASTROINTESTINAL: No nausea, vomiting, diarrhea or abdominal pain.  GENITOURINARY: No dysuria, hematuria.  ENDOCRINE: No polyuria, nocturia,  HEMATOLOGY: No anemia, easy bruising or bleeding SKIN: No rash or lesion. MUSCULOSKELETAL: No joint pain or arthritis.   NEUROLOGIC: No tingling, numbness, weakness.  PSYCHIATRY: No anxiety or depression.   DRUG ALLERGIES:   Allergies  Allergen Reactions  . Ambien [Zolpidem] Other (See Comments)    hallucinations    VITALS:  Blood pressure 138/67, pulse 84, temperature 97.6 F (36.4 C), temperature source Oral, resp. rate 16, height 5\' 8"  (1.727 m), weight 91 kg, last menstrual period 09/18/2013, SpO2 99 %.  PHYSICAL EXAMINATION:  GENERAL:  57 y.o.-year-old patient lying in the bed with no acute distress.  EYES: Pupils equal, round, reactive to light and accommodation. No scleral icterus. Extraocular muscles intact.  HEENT: Head atraumatic, normocephalic. Oropharynx and nasopharynx clear.  NECK:  Supple, no jugular venous distention. No thyroid enlargement, no tenderness.  LUNGS: Normal breath sounds bilaterally, no wheezing, rales,rhonchi or crepitation. No use of accessory muscles of respiration.  CARDIOVASCULAR: S1, S2 normal. No murmurs, rubs, or gallops.  ABDOMEN: Soft, nontender, nondistended. Bowel sounds present.  EXTREMITIES: No pedal edema, cyanosis, or clubbing.  NEUROLOGIC awake alert and oriented x3  Sensation intact. Gait not checked.   PSYCHIATRIC: The patient is alert and oriented x 3.  SKIN: No obvious rash, lesion, or ulcer.    LABORATORY PANEL:   CBC Recent Labs  Lab 12/22/17 1154  WBC 7.4  HGB 7.5*  HCT 23.5*  PLT 250   ------------------------------------------------------------------------------------------------------------------  Chemistries  Recent Labs  Lab 12/22/17 0512  NA 130*  K 4.3  CL 97*  CO2 23  GLUCOSE 133*  BUN 19  CREATININE 5.56*  CALCIUM 6.9*  MG 1.6*  AST 19  ALT 10  ALKPHOS 62  BILITOT 0.7   ------------------------------------------------------------------------------------------------------------------  Cardiac Enzymes Recent Labs  Lab 12/21/17 0641  TROPONINI 0.04*   ------------------------------------------------------------------------------------------------------------------  RADIOLOGY:  Dg Chest Portable 1 View  Result Date: 12/21/2017 CLINICAL DATA:  57 year old female with hypoglycemia. Peritoneal dialysis. EXAM: PORTABLE CHEST 1 VIEW COMPARISON:  12/16/2016 and earlier. FINDINGS: Portable AP semi upright view at 0654 hours. Stable lung volumes. Mild chronic cardiomegaly. Chronic retrocardiac/left lung base hypo ventilation appears stable since 2018. Visualized tracheal air column is within normal limits. No pneumothorax, pulmonary edema, or acute pulmonary opacity. No acute osseous abnormality identified. IMPRESSION: Chronic left lung base hypo-ventilation. No acute cardiopulmonary abnormality. Electronically Signed   By: Genevie Ann M.D.   On: 12/21/2017 07:26    EKG:   Orders placed or performed during the hospital encounter of 12/21/17  . EKG 12-Lead  . EKG 12-Lead    ASSESSMENT AND PLAN:   Jacqueline Rodriguez  is a 57 y.o. female with a known history of end-stage renal disease both on peritoneal dialysis and hemodialysis, diabetes mellitus, CHF, GERD, COPD, peripheral vascular disease and other medical problems has been lethargic for the past several days  and became altered today.  Husband  called EMS and her Accu-Chek was at 42 patient was given 200 cc of D10 and CBG went up to 160 patient is brought into the emergency department as her sugar was persistently low she was started on D10 and hospitalist team is called to admit the patient.    Encephalopathy secondary to hypoglycemia Resolved On dextrose drip titrate as needed monitor frequent Accu-Cheks Patient has glucometer but ran out of the supplies and not checking her Accu-Cheks for the past several days.  We will put a diabetic coordinator consult Neurochecks Consult diabetic coordinator   Hypothermia secondary to hypoglycemia Resolved   End-stage renal disease on peritoneal dialysis and hemodialysis Dr.kolluru nephrology is following Had hemodialysis yesterday and repeat hemodialysis today Plan is to resume Tuesday Thursday Saturday schedule  Diabetes mellitus Currently patient is still hypoglycemic and on dextrose drip Consult placed to diabetic coordinator will try to wean off the dextrose drip   Leukocytosis probably reactive we will continue close monitoring  COPD no exacerbation at this time Nebulizer treatments as needed for shortness of breath       All the records are reviewed and case discussed with Care Management/Social Workerr. Management plans discussed with the patient, family and they are in agreement.  CODE STATUS:  fc   TOTAL TIME TAKING CARE OF THIS PATIENT: 36 minutes.   POSSIBLE D/C IN 1-2  DAYS, DEPENDING ON CLINICAL CONDITION.  Note: This dictation was prepared with Dragon dictation along with smaller phrase technology. Any transcriptional errors that result from this process are unintentional.   Nicholes Mango M.D on 12/22/2017 at 1:44 PM  Between 7am to 6pm - Pager - [phone] After 6pm go to [email or web address] - password [email or web address] [facility]  Office  [phone]  CC: Primary care physician; Patient, No Pcp  Per

## 2017-12-22 NOTE — Progress Notes (Addendum)
Post HD Treatment  Pt signed off to leave treatment AMA. MD aware. Form signed and in patient chart, witnessed by Lynett Fish, RN. Net UF was 287 and BVP was 30.1. All blood was returned to patient. AMA form signed and in chart. Report called to [employer].     12/22/17 2000  Vital Signs  Pulse Rate 94  Pulse Rate Source Monitor  Resp 16  BP 125/66  BP Location Right Arm  BP Method Automatic  Patient Position (if appropriate) Lying  Oxygen Therapy  SpO2 99 %  O2 Device Room Air  Pain Assessment  Pain Scale 0-10  Pain Score 0  Dialysis Weight  Weight 93 kg  Type of Weight Post-Dialysis  Post-Hemodialysis Assessment  Rinseback Volume (mL) 250 mL  Dialyzer Clearance Lightly streaked  Duration of HD Treatment -hour(s) 2.5 hour(s)  Hemodialysis Intake (mL) 500 mL  UF Total -Machine (mL) 787 mL  Net UF (mL) 287 mL  Tolerated HD Treatment Yes  Post-Hemodialysis Comments Patient signed off AMA  AVG/AVF Arterial Site Held (minutes) 10 minutes  AVG/AVF Venous Site Held (minutes) 10 minutes  Fistula / Graft Left Upper arm Arteriovenous fistula  Placement Date/Time: 10/01/15 1110   Placed prior to admission: No  Orientation: Left  Access Location: Upper arm  Access Type: Arteriovenous fistula  Site Condition No complications  Fistula / Graft Assessment Present;Thrill;Bruit  Drainage Description None

## 2017-12-22 NOTE — Care Management Note (Signed)
Case Management Note  Patient Details  Name: Jacqueline Rodriguez MRN: [id number] Date of Birth: August 16, 1960  Subjective/Objective:    RNCM briefly able to talk with patient before transfer to floor.  Patient reports she lives at home with her husband, her son his wife and his children.  Patient reports that she is independent in ADL's.  She does not drive but states her husband and son can take her to dialysis.  Patient reports that she has in the past had hemodialysis and peritoneal dialysis.  Patient reports that her nephrologist would like for her to have 3 months of HD and maybe she can go back to peritoneal dialysis after.   Doran Clay RN BSN [phone]                  Action/Plan:   Expected Discharge Date:                  Expected Discharge Plan:     In-House Referral:     Discharge planning Services     Post Acute Care Choice:    Choice offered to:     DME Arranged:    DME Agency:     HH Arranged:    HH Agency:     Status of Service:     If discussed at [place] of Stay Meetings, dates discussed:    Additional Comments:  [name], RN 12/22/2017, 2:30 PM

## 2017-12-22 NOTE — Progress Notes (Signed)
PHARMACY CONSULT NOTE  Indication: electrolytes   Allergies  Allergen Reactions  . Ambien [Zolpidem] Other (See Comments)    hallucinations    Patient Measurements: Height: 5\' 8"  (172.7 cm) Weight: 195 lb 8.8 oz (88.7 kg) IBW/kg (Calculated) : 63.9 Adjusted Body Weight:   Vital Signs: Temp: 97.9 F (36.6 C) (12/12 0800) Temp Source: Oral (12/12 0800) BP: 160/55 (12/12 0800) Pulse Rate: 98 (12/12 0800) Intake/Output from previous day: 12/11 0701 - 12/12 0700 In: 2037.7 [I.V.:1805.2; IV Piggyback:232.5] Out: 11  Intake/Output from this shift: No intake/output data recorded.  Labs: Recent Labs    12/21/17 0641  12/21/17 0707 12/21/17 0804 12/21/17 1321 12/21/17 2050 12/22/17 0512 12/22/17 0639  WBC  --   --   --  14.9* 13.1*  --   --  8.5  HGB  --    < > 8.8* 8.1* 7.3*  --   --  7.2*  HCT  --    < > 26.0* 25.2* 22.0*  --   --  22.1*  PLT  --   --   --  337 299  --   --  241  CREATININE  --   --  10.70*  --   --  7.13* 5.56*  --   MG 1.4*  --   --   --   --   --  1.6*  --   PHOS  --   --   --   --   --  4.3 3.4  --   ALBUMIN 2.4*  --   --   --   --  2.0* 2.1*  --   PROT 6.2*  --   --   --   --   --  5.1*  --   AST 20  --   --   --   --   --  19  --   ALT 10  --   --   --   --   --  10  --   ALKPHOS 78  --   --   --   --   --  62  --   BILITOT 0.8  --   --   --   --   --  0.7  --   BILIDIR 0.2  --   --   --   --   --   --   --   IBILI 0.6  --   --   --   --   --   --   --    < > = values in this interval not displayed.   Estimated Creatinine Clearance: 12.7 mL/min (A) (by C-G formula based on SCr of 5.56 mg/dL (H)).   Microbiology: Recent Results (from the past 720 hour(s))  CULTURE, BLOOD (ROUTINE X 2) w Reflex to ID Panel     Status: None (Preliminary result)   Collection Time: 12/21/17  1:21 PM  Result Value Ref Range Status   Specimen Description BLOOD RIGHT ANTECUBITAL  Final   Special Requests   Final    BOTTLES DRAWN AEROBIC AND ANAEROBIC Blood  Culture adequate volume   Culture   Final    NO GROWTH < 24 HOURS Performed at [facility], [address] [place], [place] [address]    Report Status PENDING  Incomplete  CULTURE, BLOOD (ROUTINE X 2) w Reflex to ID Panel     Status: None (Preliminary result)   Collection Time: 12/21/17  1:39  PM  Result Value Ref Range Status   Specimen Description BLOOD RIGHT HAND  Final   Special Requests   Final    BOTTLES DRAWN AEROBIC AND ANAEROBIC Blood Culture adequate volume   Culture   Final    NO GROWTH < 24 HOURS Performed at [facility], [address] [place], [place] [address]    Report Status PENDING  Incomplete  MRSA PCR Screening     Status: None   Collection Time: 12/21/17  6:34 PM  Result Value Ref Range Status   MRSA by PCR NEGATIVE NEGATIVE Final    Comment:        The GeneXpert MRSA Assay (FDA approved for NASAL specimens only), is one component of a comprehensive MRSA colonization surveillance program. It is not intended to diagnose MRSA infection nor to guide or monitor treatment for MRSA infections. Performed at [facility], [address] [place], [place] [address]     Medical History: Past Medical History:  Diagnosis Date  . Anemia   . Chronic bronchitis ([place])   . Chronic diastolic CHF (congestive heart failure) ([place])   . CKD (chronic kidney disease), stage III ([place])   . COPD (chronic obstructive pulmonary disease) ([place])   . Diabetes mellitus with renal complications ([place])   . Essential hypertension   . GERD (gastroesophageal reflux disease)   . Obesity   . Peripheral vascular disease ([place])   . Pulmonary hypertension ([place])   . Renal insufficiency 09/19/2015   Stage 3 CKD. Beginning dialysis.    Assessment: Pharmacy consulted for electrolyte monitoring and replacement in 57 yo female with ESRD.   Plan:  12/12 Mg: 1.6. Will order MagOX 400mg  dail per nephro.    Nephrology is following patient. Will sigh off at this  time.   Pernell Dupre, PharmD, BCPS Clinical Pharmacist 12/22/2017 9:18 AM

## 2017-12-22 NOTE — Progress Notes (Signed)
HD PRE Assessment    12/22/17 1650  Neurological  Level of Consciousness Alert  Orientation Level Oriented X4  Respiratory  Respiratory Pattern Regular;Unlabored;Symmetrical  Chest Assessment Chest expansion symmetrical  Bilateral Breath Sounds Clear  Cardiac  Pulse Regular  Heart Sounds Murmur  Jugular Venous Distention (JVD) No  ECG Monitor Yes  Cardiac Rhythm NSR  Vascular  R Radial Pulse +2  L Radial Pulse +2  R Dorsalis Pedis Pulse +2  L Dorsalis Pedis Pulse +2  Integumentary  Integumentary (WDL) X  Skin Color Appropriate for ethnicity  Skin Condition Dry  Skin Integrity Ecchymosis  Musculoskeletal  Musculoskeletal (WDL) WDL  GU Assessment  Genitourinary (WDL) X (HD pt )  Peritoneal Catheter Left lower abdomen  Placement Date/Time: (c) 12/21/17 1837   Catheter Location: Left lower abdomen  Site Assessment Clean;Dry;Intact  Drainage Description None  Dressing Gauze/Drain sponge  Dressing Status Clean;Dry;Intact  Psychosocial  Psychosocial (WDL) X  Patient Behaviors Irritable  Needs Expressed Physical  Emotional support given Given to patient

## 2017-12-23 LAB — PROCALCITONIN: Procalcitonin: 0.42 ng/mL

## 2017-12-23 LAB — GLUCOSE, CAPILLARY
GLUCOSE-CAPILLARY: 134 mg/dL — AB (ref 70–99)
Glucose-Capillary: 168 mg/dL — ABNORMAL HIGH (ref 70–99)

## 2017-12-23 MED ORDER — TRAZODONE HCL 50 MG PO TABS
50.0000 mg | ORAL_TABLET | Freq: Every evening | ORAL | Status: DC | PRN
  Administered 2017-12-23: 01:00:00 50 mg via ORAL
  Filled 2017-12-23: qty 1

## 2017-12-23 NOTE — Progress Notes (Signed)
Patient left hospital AMA, explained and advise  patient the risk of leaving the hospital AMA, patient acknowledged  understanding. MD notified and aware, AC aware, AMA form signed by patient. Patient picked up by husband at 0620.

## 2017-12-26 LAB — CULTURE, BLOOD (ROUTINE X 2)
Culture: NO GROWTH
Culture: NO GROWTH
Special Requests: ADEQUATE
Special Requests: ADEQUATE

## 2017-12-28 ENCOUNTER — Other Ambulatory Visit: Payer: Self-pay

## 2017-12-28 ENCOUNTER — Inpatient Hospital Stay
Admission: EM | Admit: 2017-12-28 | Discharge: 2018-01-02 | DRG: 637 | Disposition: A | Discharge status: Home Health | Payer: Medicare Other | Attending: Internal Medicine | Admitting: Internal Medicine

## 2017-12-28 DIAGNOSIS — E8809 Other disorders of plasma-protein metabolism, not elsewhere classified: Secondary | ICD-10-CM | POA: Diagnosis not present

## 2017-12-28 DIAGNOSIS — N186 End stage renal disease: Secondary | ICD-10-CM | POA: Diagnosis present

## 2017-12-28 DIAGNOSIS — Z79899 Other long term (current) drug therapy: Secondary | ICD-10-CM | POA: Diagnosis not present

## 2017-12-28 DIAGNOSIS — I959 Hypotension, unspecified: Secondary | ICD-10-CM | POA: Diagnosis not present

## 2017-12-28 DIAGNOSIS — Z9115 Patient's noncompliance with renal dialysis: Secondary | ICD-10-CM

## 2017-12-28 DIAGNOSIS — Z888 Allergy status to other drugs, medicaments and biological substances status: Secondary | ICD-10-CM

## 2017-12-28 DIAGNOSIS — Z992 Dependence on renal dialysis: Secondary | ICD-10-CM

## 2017-12-28 DIAGNOSIS — Z833 Family history of diabetes mellitus: Secondary | ICD-10-CM

## 2017-12-28 DIAGNOSIS — I10 Essential (primary) hypertension: Secondary | ICD-10-CM | POA: Diagnosis not present

## 2017-12-28 DIAGNOSIS — E1151 Type 2 diabetes mellitus with diabetic peripheral angiopathy without gangrene: Secondary | ICD-10-CM | POA: Diagnosis present

## 2017-12-28 DIAGNOSIS — Z87891 Personal history of nicotine dependence: Secondary | ICD-10-CM

## 2017-12-28 DIAGNOSIS — E11649 Type 2 diabetes mellitus with hypoglycemia without coma: Principal | ICD-10-CM | POA: Diagnosis present

## 2017-12-28 DIAGNOSIS — E161 Other hypoglycemia: Secondary | ICD-10-CM | POA: Diagnosis not present

## 2017-12-28 DIAGNOSIS — K5900 Constipation, unspecified: Secondary | ICD-10-CM | POA: Diagnosis not present

## 2017-12-28 DIAGNOSIS — I5032 Chronic diastolic (congestive) heart failure: Secondary | ICD-10-CM | POA: Diagnosis present

## 2017-12-28 DIAGNOSIS — N2581 Secondary hyperparathyroidism of renal origin: Secondary | ICD-10-CM | POA: Diagnosis present

## 2017-12-28 DIAGNOSIS — E162 Hypoglycemia, unspecified: Secondary | ICD-10-CM | POA: Diagnosis not present

## 2017-12-28 DIAGNOSIS — E1122 Type 2 diabetes mellitus with diabetic chronic kidney disease: Secondary | ICD-10-CM | POA: Diagnosis present

## 2017-12-28 DIAGNOSIS — G9341 Metabolic encephalopathy: Secondary | ICD-10-CM | POA: Diagnosis present

## 2017-12-28 DIAGNOSIS — D631 Anemia in chronic kidney disease: Secondary | ICD-10-CM | POA: Diagnosis not present

## 2017-12-28 DIAGNOSIS — J90 Pleural effusion, not elsewhere classified: Secondary | ICD-10-CM | POA: Diagnosis not present

## 2017-12-28 DIAGNOSIS — F329 Major depressive disorder, single episode, unspecified: Secondary | ICD-10-CM | POA: Diagnosis present

## 2017-12-28 DIAGNOSIS — R0902 Hypoxemia: Secondary | ICD-10-CM | POA: Diagnosis not present

## 2017-12-28 DIAGNOSIS — Z9114 Patient's other noncompliance with medication regimen: Secondary | ICD-10-CM

## 2017-12-28 DIAGNOSIS — I132 Hypertensive heart and chronic kidney disease with heart failure and with stage 5 chronic kidney disease, or end stage renal disease: Secondary | ICD-10-CM | POA: Diagnosis present

## 2017-12-28 DIAGNOSIS — R064 Hyperventilation: Secondary | ICD-10-CM

## 2017-12-28 LAB — BASIC METABOLIC PANEL
Anion gap: 14 (ref 5–15)
BUN: 41 mg/dL — ABNORMAL HIGH (ref 6–20)
CALCIUM: 7.1 mg/dL — AB (ref 8.9–10.3)
CO2: 21 mmol/L — ABNORMAL LOW (ref 22–32)
Chloride: 95 mmol/L — ABNORMAL LOW (ref 98–111)
Creatinine, Ser: 8.18 mg/dL — ABNORMAL HIGH (ref 0.44–1.00)
GFR calc Af Amer: 6 mL/min — ABNORMAL LOW (ref >60)
GFR, EST NON AFRICAN AMERICAN: 5 mL/min — AB (ref >60)
Glucose, Bld: 115 mg/dL — ABNORMAL HIGH (ref 70–99)
Potassium: 4.9 mmol/L (ref 3.5–5.1)
SODIUM: 130 mmol/L — AB (ref 135–145)

## 2017-12-28 LAB — CBC
HCT: 22 % — ABNORMAL LOW (ref 36.0–46.0)
Hemoglobin: 7.2 g/dL — ABNORMAL LOW (ref 12.0–15.0)
MCH: 27.3 pg (ref 26.0–34.0)
MCHC: 32.7 g/dL (ref 30.0–36.0)
MCV: 83.3 fL (ref 80.0–100.0)
Platelets: 248 10*3/uL (ref 150–400)
RBC: 2.64 MIL/uL — ABNORMAL LOW (ref 3.87–5.11)
RDW: 14.5 % (ref 11.5–15.5)
WBC: 11.4 10*3/uL — AB (ref 4.0–10.5)
nRBC: 0 % (ref 0.0–0.2)

## 2017-12-28 LAB — GLUCOSE, CAPILLARY
Glucose-Capillary: 43 mg/dL — CL (ref 70–99)
Glucose-Capillary: 46 mg/dL — ABNORMAL LOW (ref 70–99)
Glucose-Capillary: 123 mg/dL — ABNORMAL HIGH (ref 70–99)

## 2017-12-28 MED ORDER — ONDANSETRON HCL 4 MG PO TABS: 4.0000 mg | ORAL_TABLET | Freq: Four times a day (QID) | ORAL | Status: DC | PRN

## 2017-12-28 MED ORDER — DEXTROSE 50 % IV SOLN
1.0000 | Freq: Once | INTRAVENOUS | Status: AC
  Administered 2017-12-28: 50 mL via INTRAVENOUS

## 2017-12-28 MED ORDER — GABAPENTIN 100 MG PO CAPS
100.0000 mg | ORAL_CAPSULE | Freq: Three times a day (TID) | ORAL | Status: DC | PRN
  Administered 2017-12-31: 100 mg via ORAL
  Filled 2017-12-28: qty 1

## 2017-12-28 MED ORDER — FUROSEMIDE 40 MG PO TABS
40.0000 mg | ORAL_TABLET | Freq: Every day | ORAL | Status: DC
  Administered 2017-12-29 – 2018-01-02 (×4): 40 mg via ORAL
  Filled 2017-12-28: qty 1
  Filled 2017-12-28: qty 2
  Filled 2017-12-28: qty 1
  Filled 2017-12-28: qty 2

## 2017-12-28 MED ORDER — ASPIRIN EC 81 MG PO TBEC
81.0000 mg | DELAYED_RELEASE_TABLET | Freq: Every day | ORAL | Status: DC
  Administered 2017-12-29 – 2018-01-02 (×5): 81 mg via ORAL
  Filled 2017-12-28 (×5): qty 1

## 2017-12-28 MED ORDER — LACTULOSE 10 GM/15ML PO SOLN
20.0000 g | ORAL | Status: DC
  Administered 2017-12-29 – 2018-01-02 (×2): 20 g via ORAL
  Filled 2017-12-28 (×2): qty 30

## 2017-12-28 MED ORDER — DOCUSATE SODIUM 100 MG PO CAPS
100.0000 mg | ORAL_CAPSULE | Freq: Two times a day (BID) | ORAL | Status: DC
  Administered 2017-12-30 – 2018-01-02 (×5): 100 mg via ORAL
  Filled 2017-12-28 (×8): qty 1

## 2017-12-28 MED ORDER — AMLODIPINE BESYLATE 5 MG PO TABS
5.0000 mg | ORAL_TABLET | Freq: Every day | ORAL | Status: DC
  Administered 2017-12-29 – 2018-01-02 (×4): 5 mg via ORAL
  Filled 2017-12-28 (×4): qty 1

## 2017-12-28 MED ORDER — DEXTROSE 10 % IV SOLN
INTRAVENOUS | Status: DC
  Administered 2017-12-29 (×3): via INTRAVENOUS

## 2017-12-28 MED ORDER — ACETAMINOPHEN 325 MG PO TABS
650.0000 mg | ORAL_TABLET | Freq: Four times a day (QID) | ORAL | Status: DC | PRN
  Administered 2017-12-29 – 2017-12-31 (×4): 650 mg via ORAL
  Filled 2017-12-28 (×4): qty 2

## 2017-12-28 MED ORDER — ACETAMINOPHEN 650 MG RE SUPP: 650.0000 mg | Freq: Four times a day (QID) | RECTAL | Status: DC | PRN

## 2017-12-28 MED ORDER — ALBUTEROL SULFATE (2.5 MG/3ML) 0.083% IN NEBU: 2.5000 mg | INHALATION_SOLUTION | Freq: Four times a day (QID) | RESPIRATORY_TRACT | Status: DC | PRN

## 2017-12-28 MED ORDER — ACETAMINOPHEN 500 MG PO TABS
500.0000 mg | ORAL_TABLET | Freq: Four times a day (QID) | ORAL | Status: DC | PRN
  Administered 2017-12-29: 500 mg via ORAL
  Filled 2017-12-28 (×2): qty 1

## 2017-12-28 MED ORDER — DEXTROSE 50 % IV SOLN
1.0000 | Freq: Once | INTRAVENOUS | Status: AC
  Administered 2017-12-28: 50 mL via INTRAVENOUS
  Filled 2017-12-28: qty 50

## 2017-12-28 MED ORDER — MECLIZINE HCL 25 MG PO TABS
25.0000 mg | ORAL_TABLET | Freq: Three times a day (TID) | ORAL | Status: DC | PRN
  Filled 2017-12-28: qty 1

## 2017-12-28 MED ORDER — DEXTROSE 10 % IV SOLN
INTRAVENOUS | Status: DC
  Administered 2017-12-28: 21:00:00 via INTRAVENOUS

## 2017-12-28 MED ORDER — SERTRALINE HCL 50 MG PO TABS
100.0000 mg | ORAL_TABLET | Freq: Every day | ORAL | Status: DC
  Administered 2017-12-29 – 2017-12-31 (×3): 100 mg via ORAL
  Filled 2017-12-28 (×3): qty 2

## 2017-12-28 MED ORDER — DEXTROSE 50 % IV SOLN
INTRAVENOUS | Status: AC
  Filled 2017-12-28: qty 50

## 2017-12-28 MED ORDER — CALCIUM ACETATE (PHOS BINDER) 667 MG PO CAPS: 2001.0000 mg | ORAL_CAPSULE | Freq: Three times a day (TID) | ORAL | Status: DC

## 2017-12-28 MED ORDER — BISACODYL 5 MG PO TBEC: 5.0000 mg | DELAYED_RELEASE_TABLET | Freq: Every day | ORAL | Status: DC | PRN

## 2017-12-28 MED ORDER — CLONIDINE HCL 0.1 MG PO TABS
0.2000 mg | ORAL_TABLET | Freq: Two times a day (BID) | ORAL | Status: DC
  Administered 2017-12-29 – 2018-01-02 (×7): 0.2 mg via ORAL
  Filled 2017-12-28 (×7): qty 2

## 2017-12-28 MED ORDER — HEPARIN SODIUM (PORCINE) 5000 UNIT/ML IJ SOLN
5000.0000 [IU] | Freq: Two times a day (BID) | INTRAMUSCULAR | Status: DC
  Administered 2017-12-29 – 2018-01-02 (×8): 5000 [IU] via SUBCUTANEOUS
  Filled 2017-12-28 (×8): qty 1

## 2017-12-28 MED ORDER — ONDANSETRON HCL 4 MG/2ML IJ SOLN
4.0000 mg | Freq: Four times a day (QID) | INTRAMUSCULAR | Status: DC | PRN
  Administered 2017-12-29 – 2018-01-01 (×4): 4 mg via INTRAVENOUS
  Filled 2017-12-28 (×5): qty 2

## 2017-12-28 MED ORDER — SEVELAMER CARBONATE 800 MG PO TABS
1600.0000 mg | ORAL_TABLET | Freq: Three times a day (TID) | ORAL | Status: DC
  Administered 2017-12-29 – 2018-01-02 (×10): 1600 mg via ORAL
  Filled 2017-12-28 (×12): qty 2

## 2017-12-28 NOTE — ED Provider Notes (Signed)
[facility] Emergency Department Provider Note   ____________________________________________   I have reviewed the triage vital signs and the nursing notes.   HISTORY  Chief Complaint Blood Sugar Problem   History limited by and level 5 caveat due to: Altered Mental Status   HPI Jacqueline Rodriguez is a 57 y.o. female who presents to the emergency department today because of concern for decreased responsiveness.  The patient is coming from home.  EMS states that when they first arrived patient was unresponsive.  She was noted to be significantly hypoglycemic in the 50s.  They did start D10 and noticed that she did start improving during transport.  The patient herself denies any recent fevers.  She states she has not been checking her sugars at home.  Does have a history of dialysis and stated she was supposed to receive it today but did not.   Per medical record review patient has a history of recent admission for hypoglycemia  Past Medical History:  Diagnosis Date  . Anemia   . Chronic bronchitis ([place])   . Chronic diastolic CHF (congestive heart failure) ([place])   . CKD (chronic kidney disease), stage III ([place])   . COPD (chronic obstructive pulmonary disease) ([place])   . Diabetes mellitus with renal complications ([place])   . Essential hypertension   . GERD (gastroesophageal reflux disease)   . Obesity   . Peripheral vascular disease ([place])   . Pulmonary hypertension ([place])   . Renal insufficiency 09/19/2015   Stage 3 CKD. Beginning dialysis.    Patient Active Problem List   Diagnosis Date Noted  . AMS (altered mental status) 12/21/2017  . Hypotension 09/07/2017  . Peritoneal dialysis catheter exit site infection ([place]) 10/05/2016  . 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 Hypocalcemia 09/14/2016  . Hyperkalemia 09/13/2016  . Syncope 09/08/2016  . ESRD on dialysis ([place]) 05/25/2016  . Chronic diastolic heart failure ([place]) 07/30/2015  . Acute renal  failure superimposed on stage 3 chronic kidney disease ([place]) 07/18/2015  . Diabetes mellitus with renal complications ([place])   . CKD (chronic kidney disease), stage III ([place])   . Pulmonary hypertension ([place])   . Obesity   . Morbid obesity due to excess calories ([place])   . Anemia in chronic renal disease   . Shortness of breath   . Back pain 05/01/2015  . Essential hypertension 03/25/2015  . Diabetes ([place]) 03/25/2015    Past Surgical History:  Procedure Laterality Date  . A/V FISTULAGRAM N/A 09/16/2016   Procedure: A/V Fistulagram;  Surgeon: Algernon Huxley, MD;  Location: [place] [facility];  Service: Cardiovascular;  Laterality: N/A;  . AV FISTULA PLACEMENT Left 10/01/2015   Procedure: ARTERIOVENOUS (AV) FISTULA CREATION ( BRACHIAL CEPHALIC );  Surgeon: Algernon Huxley, MD;  Location: [facility] ORS;  Service: Vascular;  Laterality: Left;  . CAPD INSERTION N/A 06/10/2016   Procedure: LAPAROSCOPIC INSERTION CONTINUOUS AMBULATORY PERITONEAL DIALYSIS  (CAPD) CATHETER;  Surgeon: Algernon Huxley, MD;  Location: [facility] ORS;  Service: Vascular;  Laterality: N/A;  . DIALYSIS/PERMA CATHETER INSERTION  09/16/2016   Procedure: DIALYSIS/PERMA CATHETER INSERTION;  Surgeon: Algernon Huxley, MD;  Location: [place] [facility];  Service: Cardiovascular;;  . DIALYSIS/PERMA CATHETER REMOVAL N/A 12/06/2016   Procedure: DIALYSIS/PERMA CATHETER REMOVAL;  Surgeon: Algernon Huxley, MD;  Location: [place] [facility];  Service: Cardiovascular;  Laterality: N/A;  . PERIPHERAL VASCULAR CATHETERIZATION N/A 07/21/2015   Procedure: Dialysis/Perma Catheter;  Surgeon: Algernon Huxley, MD;  Location: [facility]  INVASIVE CV LAB;  Service: Cardiovascular;  Laterality: N/A;  . PERIPHERAL VASCULAR CATHETERIZATION N/A 12/11/2015   Procedure: Dialysis/Perma Catheter Removal;  Surgeon: Algernon Huxley, MD;  Location: [place] [facility];  Service: Cardiovascular;  Laterality: N/A;    Prior to Admission medications   Medication Sig Start Date End Date Taking?  Authorizing Provider  acetaminophen (TYLENOL) 500 MG tablet Take 500 mg by mouth every 6 (six) hours as needed.    [provider]  albuterol (PROVENTIL HFA;VENTOLIN HFA) 108 (90 Base) MCG/ACT inhaler Inhale 2 puffs into the lungs every 6 (six) hours as needed for wheezing or shortness of breath. 01/08/15   Gregor Hams, MD  amLODipine (NORVASC) 5 MG tablet Take 5 mg by mouth daily. 06/22/17   [provider]  aspirin EC 81 MG EC tablet Take 1 tablet (81 mg total) by mouth daily. Patient taking differently: Take 162 mg by mouth at bedtime.  03/20/15   Vaughan Basta, MD  calcium acetate (PHOSLO) 667 MG capsule Take 3 capsules (2,001 mg total) by mouth 3 (three) times daily with meals. Patient not taking: Reported on 12/21/2017 09/10/17   Gladstone Lighter, MD  cetirizine (ZYRTEC) 10 MG tablet Take 10 mg by mouth daily as needed for allergies.    [provider]  cloNIDine (CATAPRES) 0.2 MG tablet Take 0.2 mg by mouth 2 (two) times daily. 07/02/17   [provider]  furosemide (LASIX) 40 MG tablet Take 40 mg by mouth daily. 12/04/17   [provider]  gabapentin (NEURONTIN) 100 MG capsule Take 100 mg by mouth 3 (three) times daily as needed. For neuropathy. (patient usually only takes one capsule a day) 03/05/15   [provider]  gentamicin ointment (GARAMYCIN) 0.1 % Apply topically 3 (three) times daily. 09/10/17   Gladstone Lighter, MD  glipiZIDE (GLUCOTROL) 5 MG tablet Take 5 mg by mouth daily.    [provider]  GLUCOSAMINE-CHONDROITIN PO Take 4,000 mg by mouth daily.    [provider]  lactulose (CHRONULAC) 10 GM/15ML solution Take 30 mLs by mouth every other day.  08/15/17   [provider]  lidocaine-prilocaine (EMLA) cream Apply topically 3 (three) times a week. Apply 45 minutes prior to dialysis treatment three times a week 10/22/17   [provider]  lisinopril (PRINIVIL,ZESTRIL) 10 MG tablet  Take 1 tablet by mouth daily. 08/28/17   [provider]  meclizine (ANTIVERT) 25 MG tablet Take 1 tablet (25 mg total) by mouth 3 (three) times daily as needed for dizziness. Patient taking differently: Take 25 mg by mouth daily.  12/08/15   Nance Pear, MD  ondansetron (ZOFRAN) 4 MG tablet Take 1 tablet (4 mg total) by mouth every 6 (six) hours as needed for nausea. 09/17/16   Loletha Grayer, MD  sertraline (ZOLOFT) 100 MG tablet Take 1 tablet by mouth daily. 08/02/17   [provider]  sevelamer carbonate (RENVELA) 800 MG tablet Take 1,600 mg by mouth 3 (three) times daily with meals.    [provider]    Allergies Ambien [zolpidem]  Family History  Problem Relation Age of Onset  . Hypertension Mother   . Diabetes Mellitus II Mother   . CAD Father   . Hypertension Father   . Heart attack Father     Social History Social History   Tobacco Use  . Smoking status: Former Smoker    Packs/day: 0.50    Years: 5.00    Pack years: 2.50  Types: Cigarettes    Last attempt to quit: 03/24/1985    Years since quitting: 32.7  . Smokeless tobacco: Never Used  Substance Use Topics  . Alcohol use: No    Alcohol/week: 0.0 standard drinks  . Drug use: No    Review of Systems Constitutional: No fever/chills Eyes: No visual changes. ENT: No sore throat. Cardiovascular: Denies chest pain. Respiratory: Denies shortness of breath. Gastrointestinal: No abdominal pain.  No nausea, no vomiting.  No diarrhea.   Genitourinary: Negative for dysuria. Musculoskeletal: Negative for back pain. Skin: Negative for rash. Neurological: Negative for headaches, focal weakness or numbness.  ____________________________________________   PHYSICAL EXAM:  VITAL SIGNS: ED Triage Vitals  Enc Vitals Group     BP --      Pulse Rate 12/28/17 2019 74     Resp 12/28/17 2019 15     Temp --      Temp src --      SpO2 12/28/17 2019 100 %     Weight 12/28/17 2021 193 lb  (87.5 kg)     Height 12/28/17 2021 5\' 8"  (1.727 m)     Head Circumference --      Peak Flow --      Pain Score 12/28/17 2021 5   Constitutional: Awake, alert. Somewhat somnelent Eyes: Conjunctivae are normal.  ENT      Head: Normocephalic and atraumatic.      Nose: No congestion/rhinnorhea.      Mouth/Throat: Mucous membranes are moist.      Neck: No stridor. Hematological/Lymphatic/Immunilogical: No cervical lymphadenopathy. Cardiovascular: Normal rate, regular rhythm.  No murmurs, rubs, or gallops.  Respiratory: Normal respiratory effort without tachypnea nor retractions. Breath sounds are clear and equal bilaterally. No wheezes/rales/rhonchi. Gastrointestinal: Soft and non tender. No rebound. No guarding.  Genitourinary: Deferred Musculoskeletal: Normal range of motion in all extremities. No lower extremity edema. Neurologic:  Somewhat somnolent but awakens easily. Moving all extremities.  Skin:  Skin is warm, dry and intact. No rash noted.  ____________________________________________    LABS (pertinent positives/negatives)  CBC wbc 11.4, hgb 7.2, plt 248 BMP na 130, cl 95, glu 115, cr 8.18, ca 7.1  ____________________________________________   EKG  None  ____________________________________________    RADIOLOGY  None  ____________________________________________   PROCEDURES  Procedures  ____________________________________________   INITIAL IMPRESSION / ASSESSMENT AND PLAN / ED COURSE  Pertinent labs & imaging results that were available during my care of the patient were reviewed by me and considered in my medical decision making (see chart for details).   Patient presented to the emergency department today from EMS because of decreased responsiveness.  Patient was found to be hypoglycemic.  Even after receiving IV sugars her blood sugar level continued to drop.  The emergency department.  I do think patient requires admission and further IV sugars.   Discussed with patient.  I also discussed case with nephrologist and discussed with hospitalist.   ____________________________________________   FINAL CLINICAL IMPRESSION(S) / ED DIAGNOSES  Final diagnoses:  Hypoglycemia     Note: This dictation was prepared with Dragon dictation. Any transcriptional errors that result from this process are unintentional     Nance Pear, MD 12/28/17 2238

## 2017-12-28 NOTE — ED Notes (Signed)
ED TO INPATIENT HANDOFF REPORT  Name/Age/Gender Jacqueline Rodriguez 57 y.o. female  Code Status Code Status History    Date Active Date Inactive Code Status Order ID Comments User Context   12/21/2017 1215 12/23/2017 0930 Full Code [id number]  Nicholes Mango, MD Inpatient   09/07/2017 1813 09/10/2017 2239 Full Code [id number]  Henreitta Leber, MD Inpatient   12/16/2016 0814 12/17/2016 1438 Full Code [id number]  Saundra Shelling, MD Inpatient   12/03/2016 1201 12/04/2016 [place] Full Code [id number]  Max Sane, MD Inpatient   09/13/2016 1629 09/17/2016 1817 Full Code [id number]  Epifanio Lesches, MD ED   09/08/2016 1631 09/09/2016 1823 Full Code [id number]  Fritzi Mandes, MD Inpatient   07/17/2015 1244 07/24/2015 1911 Full Code [id number]  Epifanio Lesches, MD ED   03/15/2015 1956 03/20/2015 1548 Full Code [id number]  Idelle Crouch, MD Inpatient      Home/SNF/Other home  Chief Complaint Unresponsive  Level of Care/Admitting Diagnosis ED Disposition    ED Disposition Condition [place]: [facility] [[id number]]  Level of Care: Telemetry [5]  Diagnosis: Hypoglycemia [726203]  Admitting Physician: [place] Muta [[id number]]  Attending Physician: [place] Muta [[id number]]  Estimated length of stay: 3 - 4 days  Certification:: I certify this patient will need inpatient services for at least 2 midnights  PT Class (Do Not Modify): Inpatient [101]  PT Acc Code (Do Not Modify): Private [1]       Medical History Past Medical History:  Diagnosis Date  . Anemia   . Chronic bronchitis ([place])   . Chronic diastolic CHF (congestive heart failure) ([place])   . CKD (chronic kidney disease), stage III ([place])   . COPD (chronic obstructive pulmonary disease) ([place])   . Diabetes mellitus with renal complications ([place])   . Essential hypertension   . GERD (gastroesophageal reflux disease)   . Obesity   . Peripheral vascular disease ([place])   . Pulmonary hypertension ([employer])   .  Renal insufficiency 09/19/2015   Stage 3 CKD. Beginning dialysis.    Allergies Allergies  Allergen Reactions  . Ambien [Zolpidem] Other (See Comments)    hallucinations    IV Location/Drains/Wounds Patient Lines/Drains/Airways Status   Active Line/Drains/Airways    Name:   Placement date:   Placement time:   Site:   Days:   Peripheral IV 12/28/17 Left;Anterior   12/28/17    2012    -   less than 1   Fistula / Graft Left Upper arm Arteriovenous fistula   10/01/15    1110    Upper arm   819          Labs/Imaging Results for orders placed or performed during the hospital encounter of 12/28/17 (from the past 48 hour(s))  CBC     Status: Abnormal   Collection Time: 12/28/17  8:16 PM  Result Value Ref Range   WBC 11.4 (H) 4.0 - 10.5 K/uL   RBC 2.64 (L) 3.87 - 5.11 MIL/uL   Hemoglobin 7.2 (L) 12.0 - 15.0 g/dL   HCT 22.0 (L) 36.0 - 46.0 %   MCV 83.3 80.0 - 100.0 fL   MCH 27.3 26.0 - 34.0 pg   MCHC 32.7 30.0 - 36.0 g/dL   RDW 14.5 11.5 - 15.5 %   Platelets 248 150 - 400 K/uL   nRBC 0.0 0.0 - 0.2 %    Comment: Performed at [facility], [address], [place], [place] [address]  Basic metabolic panel  Status: Abnormal   Collection Time: 12/28/17  8:16 PM  Result Value Ref Range   Sodium 130 (L) 135 - 145 mmol/L   Potassium 4.9 3.5 - 5.1 mmol/L   Chloride 95 (L) 98 - 111 mmol/L   CO2 21 (L) 22 - 32 mmol/L   Glucose, Bld 115 (H) 70 - 99 mg/dL   BUN 41 (H) 6 - 20 mg/dL   Creatinine, Ser 8.18 (H) 0.44 - 1.00 mg/dL   Calcium 7.1 (L) 8.9 - 10.3 mg/dL   GFR calc non Af Amer 5 (L) >60 mL/min   GFR calc Af Amer 6 (L) >60 mL/min   Anion gap 14 5 - 15    Comment: Performed at [facility], [address] [place], [place] [address]  Glucose, capillary     Status: Abnormal   Collection Time: 12/28/17  9:13 PM  Result Value Ref Range   Glucose-Capillary 43 (LL) 70 - 99 mg/dL   Comment 1 Notify RN   Glucose, capillary     Status: Abnormal   Collection Time:  12/28/17 10:06 PM  Result Value Ref Range   Glucose-Capillary 46 (L) 70 - 99 mg/dL  Glucose, capillary     Status: Abnormal   Collection Time: 12/28/17 10:56 PM  Result Value Ref Range   Glucose-Capillary 123 (H) 70 - 99 mg/dL   No results found.  Pending Labs Unresulted Labs (From admission, onward)    Start     Ordered   12/28/17 2120  Cortisol  Once,   STAT     12/28/17 2119   Signed and Held  CBC  (heparin)  Once,   R    Comments:  Baseline for heparin therapy IF NOT ALREADY DRAWN.  Notify MD if PLT < 100 K.    Signed and Held   Signed and Held  Creatinine, serum  (heparin)  Once,   R    Comments:  Baseline for heparin therapy IF NOT ALREADY DRAWN.    Signed and Held   Signed and Held  Basic metabolic panel  Tomorrow morning,   R     Signed and Held   Signed and Held  CBC  Tomorrow morning,   R     Signed and Held   Signed and Held  Protime-INR  Tomorrow morning,   R     Signed and Held   Signed and Held  APTT  Tomorrow morning,   R     Signed and Held          Vitals/Pain Today's Vitals   12/28/17 2019 12/28/17 2021 12/28/17 2132  Pulse: 74    Resp: 15    Temp:   (!) 96.8 F (36 C)  TempSrc:   Oral  SpO2: 100%  94%  Weight:  87.5 kg   Height:  5\' 8"  (1.727 m)   PainSc:  5      Isolation Precautions No active isolations  Medications Medications  dextrose 10 % infusion ( Intravenous New Bag/Given 12/28/17 2127)  dextrose 10 % infusion (has no administration in time range)  dextrose 50 % solution 50 mL (50 mLs Intravenous Given 12/28/17 2119)  dextrose 50 % solution 50 mL (50 mLs Intravenous Given 12/28/17 2218)    Mobility Moderate Assist with walker

## 2017-12-28 NOTE — ED Triage Notes (Signed)
Per EMS they were called out for low blood sugar/unconscious Sugar was 50 on arrival. Pt has no glucose monitor at home. She was here on 12/11 with a similar issue. Pt states she receives hemodialysis at home and she has a PD cath in place also. Pt states husband is trained to do her dialysis

## 2017-12-28 NOTE — ED Notes (Signed)
Per Dr Archie Balboa continue to use Left AC IV

## 2017-12-28 NOTE — H&P (Signed)
[facility] [place] NAME: Jacqueline Rodriguez    MR#:  [id number]  DATE OF BIRTH:  [birth year]  DATE OF ADMISSION:  12/28/2017  PRIMARY CARE PHYSICIAN: Patient, No Pcp Per   REQUESTING/REFERRING PHYSICIAN: ER  CHIEF COMPLAINT:   Chief Complaint  Patient presents with  . Blood Sugar Problem    HISTORY OF PRESENT ILLNESS:  Jacqueline Rodriguez  is a 57 y.o. female with a known history listed below presented to emergency room for evaluation of generalized weakness and family noticed change in mental status.  As per family patient was admitted with similar reasons last week.  Family member noticed patient is not making sense and confused with generalized weakness.  Patient had similar changes with low blood sugar last time.  EMS was called and patient blood sugar was in the 50s.  Patient brought to ER for further evaluation.  Patient denies any fever or chills.  Denies any dizziness.  Denies cough or sputum production.  Denies back pain or UTI symptoms.  Complaining of lower extremity pain secondary to neuropathy.  In the emergency room patient had repeated lower blood sugar.  Patient received D50 and started on D10.  As per family patient has plan for hemodialysis instead of peritoneal dialysis by nephrology.  ER physician has notified nephrology team.  Hospitalist team requested for admission.  Patient is not doing peritoneal dialysis at home secondary to running out of fluid bags.  Patient is also not checking blood sugar at home.  PAST MEDICAL HISTORY:   Past Medical History:  Diagnosis Date  . Anemia   . Chronic bronchitis ([place])   . Chronic diastolic CHF (congestive heart failure) ([place])   . CKD (chronic kidney disease), stage III ([place])   . COPD (chronic obstructive pulmonary disease) ([place])   . Diabetes mellitus with renal complications ([place])   . Essential hypertension   . GERD (gastroesophageal reflux disease)   . Obesity   . Peripheral vascular disease  ([place])   . Pulmonary hypertension ([place])   . Renal insufficiency 09/19/2015   Stage 3 CKD. Beginning dialysis.    PAST SURGICAL HISTORY:   Past Surgical History:  Procedure Laterality Date  . A/V FISTULAGRAM N/A 09/16/2016   Procedure: A/V Fistulagram;  Surgeon: Algernon Huxley, MD;  Location: [facility];  Service: Cardiovascular;  Laterality: N/A;  . AV FISTULA PLACEMENT Left 10/01/2015   Procedure: ARTERIOVENOUS (AV) FISTULA CREATION ( BRACHIAL CEPHALIC );  Surgeon: Algernon Huxley, MD;  Location: [facility];  Service: Vascular;  Laterality: Left;  . CAPD INSERTION N/A 06/10/2016   Procedure: LAPAROSCOPIC INSERTION CONTINUOUS AMBULATORY PERITONEAL DIALYSIS  (CAPD) CATHETER;  Surgeon: Algernon Huxley, MD;  Location: [facility];  Service: Vascular;  Laterality: N/A;  . DIALYSIS/PERMA CATHETER INSERTION  09/16/2016   Procedure: DIALYSIS/PERMA CATHETER INSERTION;  Surgeon: Algernon Huxley, MD;  Location: [facility];  Service: Cardiovascular;;  . DIALYSIS/PERMA CATHETER REMOVAL N/A 12/06/2016   Procedure: DIALYSIS/PERMA CATHETER REMOVAL;  Surgeon: Algernon Huxley, MD;  Location: [facility];  Service: Cardiovascular;  Laterality: N/A;  . PERIPHERAL VASCULAR CATHETERIZATION N/A 07/21/2015   Procedure: Dialysis/Perma Catheter;  Surgeon: Algernon Huxley, MD;  Location: [facility];  Service: Cardiovascular;  Laterality: N/A;  . PERIPHERAL VASCULAR CATHETERIZATION N/A 12/11/2015   Procedure: Dialysis/Perma Catheter Removal;  Surgeon: Algernon Huxley, MD;  Location: [facility];  Service: Cardiovascular;  Laterality: N/A;  SOCIAL HISTORY:   Social History   Tobacco Use  . Smoking status: Former Smoker    Packs/day: 0.50    Years: 5.00    Pack years: 2.50    Types: Cigarettes    Last attempt to quit: 03/24/1985    Years since quitting: 32.7  . Smokeless tobacco: Never Used  Substance Use Topics  . Alcohol use: No    Alcohol/week: 0.0 standard drinks    FAMILY HISTORY:    Family History  Problem Relation Age of Onset  . Hypertension Mother   . Diabetes Mellitus II Mother   . CAD Father   . Hypertension Father   . Heart attack Father     DRUG ALLERGIES:   Allergies  Allergen Reactions  . Ambien [Zolpidem] Other (See Comments)    hallucinations    REVIEW OF SYSTEMS:   ROS 12 point review of system reviewed positive as per HPI otherwise negative.  MEDICATIONS AT HOME:   Prior to Admission medications   Medication Sig Start Date End Date Taking? Authorizing Provider  acetaminophen (TYLENOL) 500 MG tablet Take 500 mg by mouth every 6 (six) hours as needed.    [provider]  albuterol (PROVENTIL HFA;VENTOLIN HFA) 108 (90 Base) MCG/ACT inhaler Inhale 2 puffs into the lungs every 6 (six) hours as needed for wheezing or shortness of breath. 01/08/15   Gregor Hams, MD  amLODipine (NORVASC) 5 MG tablet Take 5 mg by mouth daily. 06/22/17   [provider]  aspirin EC 81 MG EC tablet Take 1 tablet (81 mg total) by mouth daily. Patient taking differently: Take 162 mg by mouth at bedtime.  03/20/15   Vaughan Basta, MD  calcium acetate (PHOSLO) 667 MG capsule Take 3 capsules (2,001 mg total) by mouth 3 (three) times daily with meals. Patient not taking: Reported on 12/21/2017 09/10/17   Gladstone Lighter, MD  cetirizine (ZYRTEC) 10 MG tablet Take 10 mg by mouth daily as needed for allergies.    [provider]  cloNIDine (CATAPRES) 0.2 MG tablet Take 0.2 mg by mouth 2 (two) times daily. 07/02/17   [provider]  furosemide (LASIX) 40 MG tablet Take 40 mg by mouth daily. 12/04/17   [provider]  gabapentin (NEURONTIN) 100 MG capsule Take 100 mg by mouth 3 (three) times daily as needed. For neuropathy. (patient usually only takes one capsule a day) 03/05/15   [provider]  gentamicin ointment (GARAMYCIN) 0.1 % Apply topically 3 (three) times daily. 09/10/17   Gladstone Lighter, MD   glipiZIDE (GLUCOTROL) 5 MG tablet Take 5 mg by mouth daily.    [provider]  GLUCOSAMINE-CHONDROITIN PO Take 4,000 mg by mouth daily.    [provider]  lactulose (CHRONULAC) 10 GM/15ML solution Take 30 mLs by mouth every other day.  08/15/17   [provider]  lidocaine-prilocaine (EMLA) cream Apply topically 3 (three) times a week. Apply 45 minutes prior to dialysis treatment three times a week 10/22/17   [provider]  lisinopril (PRINIVIL,ZESTRIL) 10 MG tablet Take 1 tablet by mouth daily. 08/28/17   [provider]  meclizine (ANTIVERT) 25 MG tablet Take 1 tablet (25 mg total) by mouth 3 (three) times daily as needed for dizziness. Patient taking differently: Take 25 mg by mouth daily.  12/08/15   Nance Pear, MD  ondansetron (ZOFRAN) 4 MG tablet Take 1 tablet (4 mg total) by mouth every 6 (six) hours as needed for nausea. 09/17/16  Loletha Grayer, MD  sertraline (ZOLOFT) 100 MG tablet Take 1 tablet by mouth daily. 08/02/17   [provider]  sevelamer carbonate (RENVELA) 800 MG tablet Take 1,600 mg by mouth 3 (three) times daily with meals.    [provider]      VITAL SIGNS:  Pulse 74, temperature (!) 96.8 F (36 C), temperature source Oral, resp. rate 15, height 5\' 8"  (1.727 m), weight 87.5 kg, last menstrual period 09/18/2013, SpO2 94 %.  PHYSICAL EXAMINATION:  Physical Exam  GENERAL:  57 y.o.-year-old patient lying in the bed with no acute distress.  EYES: Pupils equal, round, reactive to light and accommodation. No scleral icterus. Extraocular muscles intact.  HEENT: Head atraumatic, normocephalic. Oropharynx and nasopharynx clear.  NECK:  Supple, no jugular venous distention. No thyroid enlargement, no tenderness.  LUNGS: Normal breath sounds bilaterally, no wheezing, rales,rhonchi or crepitation. No use of accessory muscles of respiration.  CARDIOVASCULAR: S1, S2 normal. No murmurs, rubs, or gallops.   ABDOMEN: Soft, nontender, nondistended. Bowel sounds present. No organomegaly or mass.  EXTREMITIES: No pedal edema, cyanosis, or clubbing.  Chronic changes noted. NEUROLOGIC: Nonfocal PSYCHIATRIC: Alert, awake with some confusion noted. SKIN: No obvious rash, lesion, or ulcer.   LABORATORY PANEL:   CBC Recent Labs  Lab 12/28/17 2016  WBC 11.4*  HGB 7.2*  HCT 22.0*  PLT 248   ------------------------------------------------------------------------------------------------------------------  Chemistries  Recent Labs  Lab 12/22/17 0512 12/28/17 2016  NA 130* 130*  K 4.3 4.9  CL 97* 95*  CO2 23 21*  GLUCOSE 133* 115*  BUN 19 41*  CREATININE 5.56* 8.18*  CALCIUM 6.9* 7.1*  MG 1.6*  --   AST 19  --   ALT 10  --   ALKPHOS 62  --   BILITOT 0.7  --    ------------------------------------------------------------------------------------------------------------------  Cardiac Enzymes No results for input(s): TROPONINI in the last 168 hours. ------------------------------------------------------------------------------------------------------------------  RADIOLOGY:  No results found.    IMPRESSION AND PLAN:   1.  Recurrent hypoglycemia: Likely related to diabetes medication and not doing dialysis as she suppose to do.  Patient started on D10.  Monitor sugar closely.  Adjust medication as indicated.  Monitor for signs of infection.  Patient denies any symptoms of infection.  2.  End-stage renal disease on dialysis: Plan per nephrology to start hemodialysis as per family.  Nephrology team notified.  3.  Chronic other medical problems: Monitor.  Continue home medication as ordered  DVT prophylaxis: Heparin subcutaneous  Estimated length of stay more than 2 midnight  Moderate to high risk secondary to above   All the records are reviewed and case discussed with ED provider. Management plans discussed with the patient, family and they are in agreement.  CODE  STATUS: Full  TOTAL TIME TAKING CARE OF THIS PATIENT: 50 minutes.    Sedalia Muta M.D on 12/28/2017 at 10:59 PM  Between 7am to 6pm - Pager - [phone]  After 6pm go to [email or web address] - Proofreader  [facility]  Office  [phone]  CC: Primary care physician; Patient, No Pcp Per

## 2017-12-28 NOTE — ED Notes (Signed)
Per EMS pt has bed bugs in her home. Pt is on a black EMS mat and there is no evidence of bugs. There is a lot of dry skin. Pt states she doesn't have bed bugs. She does admit in general home is unclean and she cannot do any of the cleaning and had no one to help her.

## 2017-12-29 ENCOUNTER — Other Ambulatory Visit: Payer: Self-pay

## 2017-12-29 DIAGNOSIS — E162 Hypoglycemia, unspecified: Secondary | ICD-10-CM

## 2017-12-29 LAB — GLUCOSE, CAPILLARY
GLUCOSE-CAPILLARY: 95 mg/dL (ref 70–99)
GLUCOSE-CAPILLARY: 167 mg/dL — AB (ref 70–99)
GLUCOSE-CAPILLARY: 120 mg/dL — AB (ref 70–99)
Glucose-Capillary: 102 mg/dL — ABNORMAL HIGH (ref 70–99)
Glucose-Capillary: 105 mg/dL — ABNORMAL HIGH (ref 70–99)
Glucose-Capillary: 113 mg/dL — ABNORMAL HIGH (ref 70–99)
Glucose-Capillary: 115 mg/dL — ABNORMAL HIGH (ref 70–99)
Glucose-Capillary: 116 mg/dL — ABNORMAL HIGH (ref 70–99)
Glucose-Capillary: 168 mg/dL — ABNORMAL HIGH (ref 70–99)
Glucose-Capillary: 244 mg/dL — ABNORMAL HIGH (ref 70–99)
Glucose-Capillary: 34 mg/dL — CL (ref 70–99)
Glucose-Capillary: 40 mg/dL — CL (ref 70–99)
Glucose-Capillary: 418 mg/dL — ABNORMAL HIGH (ref 70–99)
Glucose-Capillary: 42 mg/dL — CL (ref 70–99)
Glucose-Capillary: 54 mg/dL — ABNORMAL LOW (ref 70–99)
Glucose-Capillary: 55 mg/dL — ABNORMAL LOW (ref 70–99)
Glucose-Capillary: 67 mg/dL — ABNORMAL LOW (ref 70–99)
Glucose-Capillary: 70 mg/dL (ref 70–99)
Glucose-Capillary: 73 mg/dL (ref 70–99)
Glucose-Capillary: 76 mg/dL (ref 70–99)
Glucose-Capillary: 85 mg/dL (ref 70–99)
Glucose-Capillary: 96 mg/dL (ref 70–99)

## 2017-12-29 LAB — BASIC METABOLIC PANEL
Anion gap: 14 (ref 5–15)
BUN: 43 mg/dL — ABNORMAL HIGH (ref 6–20)
CO2: 22 mmol/L (ref 22–32)
Calcium: 6.8 mg/dL — ABNORMAL LOW (ref 8.9–10.3)
Chloride: 92 mmol/L — ABNORMAL LOW (ref 98–111)
Creatinine, Ser: 8.16 mg/dL — ABNORMAL HIGH (ref 0.44–1.00)
GFR calc Af Amer: 6 mL/min — ABNORMAL LOW (ref >60)
GFR calc non Af Amer: 5 mL/min — ABNORMAL LOW (ref >60)
Glucose, Bld: 37 mg/dL — CL (ref 70–99)
Potassium: 4 mmol/L (ref 3.5–5.1)
Sodium: 128 mmol/L — ABNORMAL LOW (ref 135–145)

## 2017-12-29 LAB — CBC
HCT: 21.5 % — ABNORMAL LOW (ref 36.0–46.0)
Hemoglobin: 6.8 g/dL — ABNORMAL LOW (ref 12.0–15.0)
MCH: 27.1 pg (ref 26.0–34.0)
MCHC: 31.6 g/dL (ref 30.0–36.0)
MCV: 85.7 fL (ref 80.0–100.0)
PLATELETS: 246 10*3/uL (ref 150–400)
RBC: 2.51 MIL/uL — ABNORMAL LOW (ref 3.87–5.11)
RDW: 14.4 % (ref 11.5–15.5)
WBC: 11.6 10*3/uL — ABNORMAL HIGH (ref 4.0–10.5)
nRBC: 0 % (ref 0.0–0.2)

## 2017-12-29 LAB — CORTISOL: Cortisol, Plasma: 15.8 ug/dL

## 2017-12-29 LAB — APTT: aPTT: 33 seconds (ref 24–36)

## 2017-12-29 LAB — PHOSPHORUS: PHOSPHORUS: 7.5 mg/dL — AB (ref 2.5–4.6)

## 2017-12-29 LAB — HEMOGLOBIN A1C
Hgb A1c MFr Bld: 5.5 % (ref 4.8–5.6)
Mean Plasma Glucose: 111.15 mg/dL

## 2017-12-29 LAB — PROTIME-INR
INR: 1.18
Prothrombin Time: 14.9 seconds (ref 11.4–15.2)

## 2017-12-29 LAB — PREPARE RBC (CROSSMATCH)

## 2017-12-29 MED ORDER — LIDOCAINE-PRILOCAINE 2.5-2.5 % EX CREA
1.0000 "application " | TOPICAL_CREAM | CUTANEOUS | Status: DC | PRN
  Filled 2017-12-29: qty 5

## 2017-12-29 MED ORDER — DEXTROSE 50 % IV SOLN
2.0000 | Freq: Once | INTRAVENOUS | Status: AC
  Administered 2017-12-29: 100 mL via INTRAVENOUS

## 2017-12-29 MED ORDER — SODIUM CHLORIDE 0.9 % IV SOLN: 100.0000 mL | INTRAVENOUS | Status: DC | PRN

## 2017-12-29 MED ORDER — EPOETIN ALFA 10000 UNIT/ML IJ SOLN
10000.0000 [IU] | INTRAMUSCULAR | Status: DC
  Administered 2017-12-29 – 2018-01-02 (×3): 10000 [IU] via INTRAVENOUS
  Filled 2017-12-29: qty 1

## 2017-12-29 MED ORDER — GLUCAGON HCL RDNA (DIAGNOSTIC) 1 MG IJ SOLR
1.0000 mg | Freq: Once | INTRAMUSCULAR | Status: AC | PRN
  Administered 2017-12-29: 1 mg via INTRAVENOUS
  Filled 2017-12-29: qty 1

## 2017-12-29 MED ORDER — ORAL CARE MOUTH RINSE
15.0000 mL | Freq: Two times a day (BID) | OROMUCOSAL | Status: DC
  Administered 2017-12-30 – 2018-01-02 (×5): 15 mL via OROMUCOSAL

## 2017-12-29 MED ORDER — SODIUM CHLORIDE 0.9% IV SOLUTION: Freq: Once | INTRAVENOUS | Status: DC

## 2017-12-29 MED ORDER — DEXTROSE 50 % IV SOLN
2.0000 | Freq: Once | INTRAVENOUS | Status: AC
  Administered 2017-12-29: 100 mL via INTRAVENOUS
  Filled 2017-12-29: qty 100

## 2017-12-29 MED ORDER — CHLORHEXIDINE GLUCONATE CLOTH 2 % EX PADS
6.0000 | MEDICATED_PAD | Freq: Every day | CUTANEOUS | Status: DC
  Administered 2017-12-29 – 2017-12-30 (×2): 6 via TOPICAL

## 2017-12-29 MED ORDER — HYDROCORTISONE NA SUCCINATE PF 100 MG IJ SOLR
100.0000 mg | Freq: Four times a day (QID) | INTRAMUSCULAR | Status: DC
  Administered 2017-12-29 – 2017-12-30 (×4): 100 mg via INTRAVENOUS
  Filled 2017-12-29 (×5): qty 2

## 2017-12-29 MED ORDER — PENTAFLUOROPROP-TETRAFLUOROETH EX AERO
1.0000 "application " | INHALATION_SPRAY | CUTANEOUS | Status: DC | PRN
  Filled 2017-12-29: qty 30

## 2017-12-29 MED ORDER — LIDOCAINE HCL (PF) 1 % IJ SOLN
5.0000 mL | INTRAMUSCULAR | Status: DC | PRN
  Filled 2017-12-29: qty 5

## 2017-12-29 MED ORDER — DEXTROSE 50 % IV SOLN
INTRAVENOUS | Status: AC
  Filled 2017-12-29: qty 100

## 2017-12-29 MED ORDER — DEXTROSE 50 % IV SOLN
INTRAVENOUS | Status: AC
  Administered 2017-12-29: 50 mL
  Filled 2017-12-29: qty 50

## 2017-12-29 MED ORDER — HEPARIN SODIUM (PORCINE) 1000 UNIT/ML DIALYSIS
1000.0000 [IU] | INTRAMUSCULAR | Status: DC | PRN
  Filled 2017-12-29: qty 1

## 2017-12-29 NOTE — Progress Notes (Signed)
A CODE BLUE was called as patient was found unresponsive and noted to have blood sugar of 12.  Patient was admitted to the hospital due to recurrent hypoglycemia of unknown etiology.  Upon arrival to the patient's room she had received multiple doses of dextrose and her blood sugars are improved.  Patient mental status was starting to improve.  She never received CPR.   - will transfer pt. To ICU/Step down level or care for closer monitoring and q1hr fingersticks.   Time Spent - 25 min.

## 2017-12-29 NOTE — Progress Notes (Signed)
Pt blood sugar on admit 67 with patient alert and oriented, no concerns offered.. 8 oz Grape juice given and blood sugar increased to 70 and 73. MD made aware, no new orders except to continue Q2 monitoring. At 04:09 CBG was 58m, pt asymptomatic and alert and oriented with no noted distress. Per hypoglycemic protocol, one amp D50 given. CBG on recheck was 95.

## 2017-12-29 NOTE — Progress Notes (Signed)
[place], [place] 12/29/17  Subjective:   Patient known to our practice from outpatient dialysis.  She was here last week for hypoglycemia.  Was treated with IV dextrose.  Left AMA last week.  Told the nurse that she took her oral hypoglycemics (glipizide) yesterday.  Patient is persistently hyperglycemic and is getting dextrose 10% drip at 150 cc/h.  She has developed some edema.  She also reports some coughing and congestion in the lungs. Work-up so far shows elevated TSH at 8.3, borderline cortisol at 15.8.  No Recent hemoglobin A1c levels are available. Patient was a difficult IV access placement.  According to nursing, IV team tried 3 times ultimately IV had to be placed in the same room as her access.  IV team has been reconsulted for moving IV line to non- access arm.  Objective:  Vital signs in last 24 hours:  Temp:  [96.8 F (36 C)-97.7 F (36.5 C)] 97.7 F (36.5 C) (12/19 0325) Pulse Rate:  [71-77] 77 (12/19 0736) Resp:  [11-24] 18 (12/19 0736) BP: (120-149)/(48-69) 120/48 (12/19 0736) SpO2:  [88 %-100 %] 100 % (12/19 0736) Weight:  [87.5 kg-93.6 kg] 93.6 kg (12/19 0325)  Weight change:  Filed Weights   12/28/17 2021 12/28/17 2345 12/29/17 0325  Weight: 87.5 kg 93.6 kg 93.6 kg    Intake/Output:   No intake or output data in the 24 hours ending 12/29/17 1057   Physical Exam: General:  Lying in the bed, no acute distress  HEENT  moist oral mucous membranes  Neck  supple  Pulm/lungs  clear to auscultation  CVS/Heart  regular rhythm  Abdomen:   Soft, nontender  Extremities:  Upper arm edema bilaterally  Neurologic:  Alert, oriented  Skin:  No acute rashes  Access:  Left upper arm AV fistula       Basic Metabolic Panel:  Recent Labs  Lab 12/28/17 2016 12/29/17 0533  NA 130* 128*  K 4.9 4.0  CL 95* 92*  CO2 21* 22  GLUCOSE 115* 37*  BUN 41* 43*  CREATININE 8.18* 8.16*  CALCIUM 7.1* 6.8*  PHOS  --  7.5*     CBC: Recent  Labs  Lab 12/22/17 1154 12/28/17 2016 12/29/17 0533  WBC 7.4 11.4* 11.6*  HGB 7.5* 7.2* 6.8*  HCT 23.5* 22.0* 21.5*  MCV 83.0 83.3 85.7  PLT 250 248 246      Lab Results  Component Value Date   HEPBSAG Negative 12/21/2017   HEPBSAB Non Reactive 12/21/2017   HEPBIGM Negative 09/08/2017      Microbiology:  Recent Results (from the past 240 hour(s))  CULTURE, BLOOD (ROUTINE X 2) w Reflex to ID Panel     Status: None   Collection Time: 12/21/17  1:21 PM  Result Value Ref Range Status   Specimen Description BLOOD RIGHT ANTECUBITAL  Final   Special Requests   Final    BOTTLES DRAWN AEROBIC AND ANAEROBIC Blood Culture adequate volume   Culture   Final    NO GROWTH 5 DAYS Performed at [facility], [address] [place], [place] [address]    Report Status 12/26/2017 FINAL  Final  CULTURE, BLOOD (ROUTINE X 2) w Reflex to ID Panel     Status: None   Collection Time: 12/21/17  1:39 PM  Result Value Ref Range Status   Specimen Description BLOOD RIGHT HAND  Final   Special Requests   Final    BOTTLES DRAWN AEROBIC AND ANAEROBIC Blood Culture  adequate volume   Culture   Final    NO GROWTH 5 DAYS Performed at [facility], [address] [place], [place] [address]    Report Status 12/26/2017 FINAL  Final  MRSA PCR Screening     Status: None   Collection Time: 12/21/17  6:34 PM  Result Value Ref Range Status   MRSA by PCR NEGATIVE NEGATIVE Final    Comment:        The GeneXpert MRSA Assay (FDA approved for NASAL specimens only), is one component of a comprehensive MRSA colonization surveillance program. It is not intended to diagnose MRSA infection nor to guide or monitor treatment for MRSA infections. Performed at [facility], [address] [place], [place] [address]     Coagulation Studies: Recent Labs    12/29/17 0533  LABPROT 14.9  INR 1.18    Urinalysis: No results for input(s): COLORURINE, LABSPEC, PHURINE, GLUCOSEU,  HGBUR, BILIRUBINUR, KETONESUR, PROTEINUR, UROBILINOGEN, NITRITE, LEUKOCYTESUR in the last 72 hours.  Invalid input(s): APPERANCEUR    Imaging: No results found.   Medications:   . sodium chloride    . sodium chloride    . dextrose 150 mL/hr at 12/29/17 0734   . sodium chloride   Intravenous Once  . amLODipine  5 mg Oral Daily  . aspirin EC  81 mg Oral Daily  . Chlorhexidine Gluconate Cloth  6 each Topical Q0600  . cloNIDine  0.2 mg Oral BID  . docusate sodium  100 mg Oral BID  . furosemide  40 mg Oral Daily  . heparin  5,000 Units Subcutaneous Q12H  . lactulose  20 g Oral QODAY  . sertraline  100 mg Oral Daily  . sevelamer carbonate  1,600 mg Oral TID WC   sodium chloride, sodium chloride, acetaminophen **OR** acetaminophen, acetaminophen, albuterol, bisacodyl, gabapentin, heparin, lidocaine (PF), lidocaine-prilocaine, meclizine, ondansetron **OR** ondansetron (ZOFRAN) IV, ondansetron, pentafluoroprop-tetrafluoroeth  Assessment/ Plan:  57 y.o. Caucasian female with end-stage renal disease, history of noncompliance, currently hemodialysis, diabetes, GERD, COPD, peripheral vascular disease presents for persistent hypoglycemia  [facility] N/TTS/left arm AV fistula  1.  End-stage renal disease 2.  Persistent hypoglycemia 3.  Anemia of chronic kidney disease 4.  Secondary hyperparathyroidism  Plan: Hemodialysis today Continue dextrose 10% administration as needed for hypoglycemia We will add stress dose steroids as cortisol level is borderline Right upper quadrant ultrasound in AM to evaluate for cirrhosis as Albumin is low Discontinue oral hypoglycemics. EPO with HD    LOS: [place] 12/19/201910:57 AM  [place], [place]  Note: This note was prepared with Dragon dictation. Any transcription errors are unintentional

## 2017-12-29 NOTE — Progress Notes (Addendum)
Patient's blood sugar 42 this am while getting report from night shift. MD paged to notify. Verbal orders with read back with Dr.Sainini to give 2 ampules of dextrose 50% solution 134ml,1 bottle of ensure, and transfuse 1 unit of blood. Will continue to monitor patient.   Update : 0908 Patients blood sugar 102. Will continue to monitor patient.

## 2017-12-29 NOTE — Consult Note (Signed)
Name: Jacqueline Rodriguez MRN: [id number] DOB: [birth year]     CONSULTATION DATE: 12/28/2017  REFERRING MD :  Verdell Carmine  CHIEF COMPLAINT: mental status changes    HISTORY OF PRESENT ILLNESS:  57 yo WF with acute mental status changes Admitted to ICU for severe hypoglycemia FSBS was 12, acute LOC, CODE BLUE called 2 AMPS glucose given Did NOT lose pulse and did NOT stop breathing Patient very lethargic but arousable No seizure activitity   FSBS now 167 after therapy  Critically ill Prognosis is guarded  Patient with multiple    PAST MEDICAL HISTORY :   has a past medical history of Anemia, Chronic bronchitis ([place]), Chronic diastolic CHF (congestive heart failure) ([place]), CKD (chronic kidney disease), stage III ([place]), COPD (chronic obstructive pulmonary disease) ([place]), Diabetes mellitus with renal complications ([place]), Essential hypertension, GERD (gastroesophageal reflux disease), Obesity, Peripheral vascular disease ([place]), Pulmonary hypertension ([place]), and Renal insufficiency (09/19/2015).  has a past surgical history that includes Cardiac catheterization (N/A, 07/21/2015); AV fistula placement (Left, 10/01/2015); Cardiac catheterization (N/A, 12/11/2015); CAPD insertion (N/A, 06/10/2016); A/V Fistulagram (N/A, 09/16/2016); DIALYSIS/PERMA CATHETER INSERTION (09/16/2016); and DIALYSIS/PERMA CATHETER REMOVAL (N/A, 12/06/2016). Prior to Admission medications   Medication Sig Start Date End Date Taking? Authorizing Provider  acetaminophen (TYLENOL) 500 MG tablet Take 500 mg by mouth every 6 (six) hours as needed.    [provider]  albuterol (PROVENTIL HFA;VENTOLIN HFA) 108 (90 Base) MCG/ACT inhaler Inhale 2 puffs into the lungs every 6 (six) hours as needed for wheezing or shortness of breath. 01/08/15   Gregor Hams, MD  amLODipine (NORVASC) 5 MG tablet Take 5 mg by mouth daily. 06/22/17   [provider]  aspirin EC 81 MG EC tablet Take 1 tablet (81 mg total) by mouth  daily. Patient taking differently: Take 162 mg by mouth at bedtime.  03/20/15   Vaughan Basta, MD  calcium acetate (PHOSLO) 667 MG capsule Take 3 capsules (2,001 mg total) by mouth 3 (three) times daily with meals. Patient not taking: Reported on 12/21/2017 09/10/17   Gladstone Lighter, MD  cetirizine (ZYRTEC) 10 MG tablet Take 10 mg by mouth daily as needed for allergies.    [provider]  cloNIDine (CATAPRES) 0.2 MG tablet Take 0.2 mg by mouth 2 (two) times daily. 07/02/17   [provider]  furosemide (LASIX) 40 MG tablet Take 40 mg by mouth daily. 12/04/17   [provider]  gabapentin (NEURONTIN) 100 MG capsule Take 100 mg by mouth 3 (three) times daily as needed. For neuropathy. (patient usually only takes one capsule a day) 03/05/15   [provider]  gentamicin ointment (GARAMYCIN) 0.1 % Apply topically 3 (three) times daily. 09/10/17   Gladstone Lighter, MD  glipiZIDE (GLUCOTROL) 5 MG tablet Take 5 mg by mouth daily.    [provider]  GLUCOSAMINE-CHONDROITIN PO Take 4,000 mg by mouth daily.    [provider]  lactulose (CHRONULAC) 10 GM/15ML solution Take 30 mLs by mouth every other day.  08/15/17   [provider]  lidocaine-prilocaine (EMLA) cream Apply topically 3 (three) times a week. Apply 45 minutes prior to dialysis treatment three times a week 10/22/17   [provider]  lisinopril (PRINIVIL,ZESTRIL) 10 MG tablet Take 1 tablet by mouth daily. 08/28/17   [provider]  meclizine (ANTIVERT) 25 MG tablet Take 1 tablet (25 mg total) by mouth 3 (three) times daily as needed for dizziness. Patient taking differently: Take 25 mg by mouth daily.  12/08/15   Nance Pear, MD  ondansetron (ZOFRAN) 4 MG tablet Take 1 tablet (4 mg total) by mouth every 6 (six) hours as needed for nausea. 09/17/16   Loletha Grayer, MD  sertraline (ZOLOFT) 100 MG tablet Take 1 tablet by mouth daily. 08/02/17   [provider]  sevelamer carbonate (RENVELA) 800 MG tablet Take 1,600 mg by mouth 3 (three) times daily with meals.    [provider]   Allergies  Allergen Reactions  . Ambien [Zolpidem] Other (See Comments)    hallucinations    FAMILY HISTORY:  family history includes CAD in her father; Diabetes Mellitus II in her mother; Heart attack in her father; Hypertension in her father and mother. SOCIAL HISTORY:  reports that she quit smoking about 32 years ago. Her smoking use included cigarettes. She has a 2.50 pack-year smoking history. She has never used smokeless tobacco. She reports that she does not drink alcohol or use drugs.  REVIEW OF SYSTEMS:   Unable to obtain due to critical illness   VITAL SIGNS: Temp:  [96.8 F (36 C)-97.7 F (36.5 C)] 97.7 F (36.5 C) (12/19 0325) Pulse Rate:  [71-77] 77 (12/19 0736) Resp:  [11-24] 18 (12/19 0736) BP: (120-149)/(48-69) 120/48 (12/19 0736) SpO2:  [88 %-100 %] 100 % (12/19 0736) Weight:  [87.5 kg-93.6 kg] 93.6 kg (12/19 0325)  Physical Examination:  GENERAL:critically ill appearing, +resp distress HEAD: Normocephalic, atraumatic.  EYES: Pupils equal, round, reactive to light.  No scleral icterus.  MOUTH: Moist mucosal membrane. NECK: Supple. No JVD.  PULMONARY: +rhonchi, +wheezing CARDIOVASCULAR: S1 and S2. Regular rate and rhythm. No murmurs, rubs, or gallops.  GASTROINTESTINAL: Soft, nontender, -distended. No masses. Positive bowel sounds. No hepatosplenomegaly.  MUSCULOSKELETAL: No swelling, clubbing, or edema.  NEUROLOGIC: lethargic but arousable SKIN:intact,warm,dry    ASSESSMENT / PLAN:  Severe Hypoglycemia-?etiology(drugs induced, liver induced, adrenal malfunction) Close monitoring-ICU status Check FSBS every 1 hr High risk for cardiac arrest High risk for intubation Start D10 infusion at 100 Consider glucagon    Critical Care Time devoted to patient care services described in this note is 36 minutes.     Overall, patient is critically ill, prognosis is guarded.  Patient with Multiorgan failure and at high risk for cardiac arrest and death.    Corrin Parker, M.D.  [facility]  Medical Director [place] Director [facility] Cardio-Pulmonary Department

## 2017-12-29 NOTE — Progress Notes (Signed)
Inpatient Diabetes Program Recommendations  AACE/ADA: New Consensus Statement on Inpatient Glycemic Control (2015)  Target Ranges:  Prepandial:   less than 140 mg/dL      Peak postprandial:   less than 180 mg/dL (1-2 hours)      Critically ill patients:  140 - 180 mg/dL   Lab Results  Component Value Date   GLUCAP 85 12/29/2017   HGBA1C 6.2 (H) 12/16/2016    Review of Glycemic Control Results for Jacqueline Rodriguez, Jacqueline Rodriguez (MRN [id number]) as of 12/29/2017 11:16  Ref. Range 12/29/2017 06:00 12/29/2017 06:49 12/29/2017 07:33 12/29/2017 09:08 12/29/2017 10:29  Glucose-Capillary Latest Ref Range: 70 - 99 mg/dL 40 (LL) 54 (L) 42 (LL) 102 (H) 85   Diabetes history: DM 2 Outpatient Diabetes medications:  Glucotrol 5 mg daily (per RN, patient took yesterday) Current orders for Inpatient glycemic control:  Solucortef 100 mg q 6 hours Inpatient Diabetes Program Recommendations:    Note that patient was in hospital last week and was having low blood sugars.  It appears that she left AMA prior to getting instructions on home meds.  Per RN, patient was taking Glucotrol 5 mg daily at home and she took yesterday.  Glucotrol can cause significant hypoglycemia in patients (especially with renal failure) and these effects can last 24-48 hours.   Consider d/c of Glucotrol from home medications.   Thanks,  Adah Perl, RN, BC-ADM Inpatient Diabetes Coordinator Pager [phone] (8a-5p)

## 2017-12-29 NOTE — Progress Notes (Signed)
Pt CBG still 55 and then 40. MD made aware. One ml glucagon ordered and d10 increased to 150 ml/hr. Recheck CBG unchanged at 42. Oncoming nurse notified.

## 2017-12-29 NOTE — Progress Notes (Signed)
Chaplain responded to a RR then Code Blue for room 240. Son was at the bedside. Chaplain maintained pastoral presence and offered and executed prayer. Pt was moved to [place]. Chaplain escorted son to waiting area. Son left to make phone call and after 15 minutes chaplain went to notify ICU. Chaplain was paged away.    12/29/17 1500  Clinical Encounter Type  Visited With Patient and family together  Visit Type Code  Referral From Nurse  Spiritual Encounters  Spiritual Needs Prayer;Emotional

## 2017-12-29 NOTE — Progress Notes (Signed)
[place] at [place] NAME: Jacqueline Rodriguez    MR#:  [id number]  DATE OF BIRTH:  November 02, 1960  SUBJECTIVE:   Patient admitted to the hospital secondary to recurrent hypoglycemia.  She was recently hospitalized for similar problems but left AGAINST MEDICAL ADVICE.  This morning patient's blood sugars was noted to be in the 40s to 50s and received multiple doses of dextrose and blood sugars have improved to the low 100s.  She remains on a D10 drip.  Patient's son is at bedside.  REVIEW OF SYSTEMS:    Review of Systems  Constitutional: Negative for chills and fever.  HENT: Negative for congestion and tinnitus.   Eyes: Negative for blurred vision and double vision.  Respiratory: Negative for cough, shortness of breath and wheezing.   Cardiovascular: Negative for chest pain, orthopnea and PND.  Gastrointestinal: Negative for abdominal pain, diarrhea, nausea and vomiting.  Genitourinary: Negative for dysuria and hematuria.  Neurological: Negative for dizziness, sensory change and focal weakness.  All other systems reviewed and are negative.   Nutrition: Renal with fluid restriction Tolerating Diet: Yes Tolerating PT: Await Eval.   DRUG ALLERGIES:   Allergies  Allergen Reactions  . Ambien [Zolpidem] Other (See Comments)    hallucinations    VITALS:  Blood pressure (!) 120/48, pulse 77, temperature 97.7 F (36.5 C), temperature source Oral, resp. rate 18, height 5\' 4"  (1.626 m), weight 93.6 kg, last menstrual period 09/18/2013, SpO2 100 %.  PHYSICAL EXAMINATION:   Physical Exam  GENERAL:  57 y.o.-year-old patient lying in bed in no acute distress.  EYES: Pupils equal, round, reactive to light and accommodation. No scleral icterus. Extraocular muscles intact. Pale Conjuctiva.  HEENT: Head atraumatic, normocephalic. Oropharynx and nasopharynx clear.  NECK:  Supple, no jugular venous distention. No thyroid enlargement, no tenderness.    LUNGS: Normal breath sounds bilaterally, no wheezing, rales, rhonchi. No use of accessory muscles of respiration.  CARDIOVASCULAR: S1, S2 normal. No murmurs, rubs, or gallops.  ABDOMEN: Soft, nontender, nondistended. Bowel sounds present. No organomegaly or mass. + PD cath in place.  EXTREMITIES: No cyanosis, clubbing or edema b/l.    NEUROLOGIC: Cranial nerves II through XII are intact. No focal Motor or sensory deficits b/l.  Globally weak.  PSYCHIATRIC: The patient is alert and oriented x 3.  SKIN: No obvious rash, lesion, or ulcer.   Left upper ext. AV fistula with good bruit, thrill.   LABORATORY PANEL:   CBC Recent Labs  Lab 12/29/17 0533  WBC 11.6*  HGB 6.8*  HCT 21.5*  PLT 246   ------------------------------------------------------------------------------------------------------------------  Chemistries  Recent Labs  Lab 12/29/17 0533  NA 128*  K 4.0  CL 92*  CO2 22  GLUCOSE 37*  BUN 43*  CREATININE 8.16*  CALCIUM 6.8*   ------------------------------------------------------------------------------------------------------------------  Cardiac Enzymes No results for input(s): TROPONINI in the last 168 hours. ------------------------------------------------------------------------------------------------------------------  RADIOLOGY:  No results found.   ASSESSMENT AND PLAN:   57 year old female with past medical history of end-stage renal disease on hemodialysis, essential hypertension, diabetes, COPD, chronic diastolic CHF, history of pulmonary hypertension, peripheral vascular disease who was admitted to the hospital due to weakness and altered mental status and noted to have hypoglycemia.  1.  Altered mental status- metabolic encephalopathy secondary to severe hypoglycemia. - Blood sugars have somewhat improved since yesterday mental status has improved.  Will continue to monitor.  2.  Recurrent hypoglycemia-etiology unclear.  Patient is on glipizide  but  dose has not been recently changed.  She denies taking extra doses of medications by accident.  She is not hypothermic and no clinical evidence of sepsis to precipitate hypoglycemia. - Will need to rule out adrenal insufficiency, chronic liver disease.  Cortisol level has been ordered, empirically patient has been started on some IV steroids.  Continue dextrose infusion. - Will check insulin, C-peptide level, sulfonylurea screen, will also work-up for liver disease and await right upper quadrant ultrasound.  Appreciate nephrology input regarding this.  3.  End-stage renal disease on hemodialysis-nephrology has been consulted.  Patient is noncompliant with her PD.  Continue hemodialysis as per nephrology.  4.  Essential hypertension-continue Norvasc, clonidine  5.  Secondary hyperparathyroidism-continue Renvela.  6.  Depression-continue Zoloft.   All the records are reviewed and case discussed with Care Management/Social Worker. Management plans discussed with the patient, family and they are in agreement.  CODE STATUS: Full code  DVT Prophylaxis: Hep SQ  TOTAL TIME TAKING CARE OF THIS PATIENT: 35 minutes.   POSSIBLE D/C IN 2-3 DAYS, DEPENDING ON CLINICAL CONDITION.   Henreitta Leber M.D on 12/29/2017 at 3:26 PM  Between 7am to 6pm - Pager - [phone]  After 6pm go to [email or web address] - Proofreader  [facility]  Office  [phone]  CC: Primary care physician; Patient, No Pcp Per

## 2017-12-29 NOTE — Progress Notes (Signed)
HD Treatment Initiated    12/29/17 2239  Vital Signs  Pulse Rate 69  Resp (!) 9  Oxygen Therapy  SpO2 100 %  During Hemodialysis Assessment  Blood Flow Rate (mL/min) 300 mL/min  Arterial Pressure (mmHg) -110 mmHg  Venous Pressure (mmHg) 110 mmHg  Transmembrane Pressure (mmHg) 60 mmHg  Ultrafiltration Rate (mL/min) 1000 mL/min  Dialysate Flow Rate (mL/min) 600 ml/min  Conductivity: Machine  14.1  HD Safety Checks Performed Yes  Dialysis Fluid Bolus Normal Saline  Bolus Amount (mL) 250 mL  Intra-Hemodialysis Comments Tx initiated;Progressing as prescribed  Fistula / Graft Left Upper arm Arteriovenous fistula  Placement Date/Time: 10/01/15 1110   Placed prior to admission: No  Orientation: Left  Access Location: Upper arm  Access Type: Arteriovenous fistula  Status Accessed  Needle Size 16

## 2017-12-30 ENCOUNTER — Inpatient Hospital Stay: Payer: Medicare Other

## 2017-12-30 LAB — TYPE AND SCREEN
ABO/RH(D): A NEG
Antibody Screen: NEGATIVE
Unit division: 0

## 2017-12-30 LAB — BPAM RBC
Blood Product Expiration Date: 202001112359
ISSUE DATE / TIME: 201912192357
Unit Type and Rh: 600

## 2017-12-30 LAB — GLUCOSE, CAPILLARY
GLUCOSE-CAPILLARY: 12 mg/dL — AB (ref 70–99)
GLUCOSE-CAPILLARY: 200 mg/dL — AB (ref 70–99)
GLUCOSE-CAPILLARY: 208 mg/dL — AB (ref 70–99)
Glucose-Capillary: 133 mg/dL — ABNORMAL HIGH (ref 70–99)
Glucose-Capillary: 148 mg/dL — ABNORMAL HIGH (ref 70–99)
Glucose-Capillary: 164 mg/dL — ABNORMAL HIGH (ref 70–99)
Glucose-Capillary: 207 mg/dL — ABNORMAL HIGH (ref 70–99)
Glucose-Capillary: 235 mg/dL — ABNORMAL HIGH (ref 70–99)
Glucose-Capillary: 239 mg/dL — ABNORMAL HIGH (ref 70–99)
Glucose-Capillary: 240 mg/dL — ABNORMAL HIGH (ref 70–99)
Glucose-Capillary: 240 mg/dL — ABNORMAL HIGH (ref 70–99)
Glucose-Capillary: 257 mg/dL — ABNORMAL HIGH (ref 70–99)
Glucose-Capillary: 260 mg/dL — ABNORMAL HIGH (ref 70–99)

## 2017-12-30 LAB — HEMOGLOBIN AND HEMATOCRIT, BLOOD
HCT: 28 % — ABNORMAL LOW (ref 36.0–46.0)
Hemoglobin: 9.1 g/dL — ABNORMAL LOW (ref 12.0–15.0)

## 2017-12-30 MED ORDER — GUAIFENESIN 100 MG/5ML PO SOLN
5.0000 mL | ORAL | Status: DC | PRN
  Administered 2017-12-30 – 2017-12-31 (×2): 100 mg via ORAL
  Filled 2017-12-30 (×5): qty 5

## 2017-12-30 NOTE — Progress Notes (Signed)
Post HD  Pt system clotted twice during treatment. Her Net UF was 2738 and her BVP was 51.3.     12/30/17 0315  Hand-Off documentation  Report given to (Full Name) Lowella Grip, RN  Report received from (Full Name) Stephannie Peters, RN  Vital Signs  Temp 97.7 F (36.5 C)  Temp Source Oral  Pulse Rate 76  Pulse Rate Source Monitor  Resp (!) 9  BP (!) 110/52  BP Location Right Arm  BP Method Automatic  Patient Position (if appropriate) Lying  Oxygen Therapy  SpO2 100 %  O2 Device Nasal Cannula  Pain Assessment  Pain Scale 0-10  Pain Score Asleep  Dialysis Weight  Weight 96.8 kg  Type of Weight Post-Dialysis  Post-Hemodialysis Assessment  Rinseback Volume (mL) 250 mL  Dialyzer Clearance Lightly streaked  Duration of HD Treatment -hour(s) 3.5 hour(s)  Hemodialysis Intake (mL) 1130 mL  UF Total -Machine (mL) 3868 mL  Net UF (mL) 2738 mL  Tolerated HD Treatment Yes  AVG/AVF Arterial Site Held (minutes) 10 minutes  AVG/AVF Venous Site Held (minutes) 10 minutes  Fistula / Graft Left Upper arm Arteriovenous fistula  Placement Date/Time: 10/01/15 1110   Placed prior to admission: No  Orientation: Left  Access Location: Upper arm  Access Type: Arteriovenous fistula  Site Condition No complications  Fistula / Graft Assessment Present;Thrill;Bruit  Drainage Description None

## 2017-12-30 NOTE — Progress Notes (Signed)
HD Treatment Complete    12/30/17 0300  Vital Signs  Pulse Rate 76  Pulse Rate Source Monitor  Resp 10  BP (!) 118/52  BP Location Right Arm  BP Method Automatic  Patient Position (if appropriate) Lying  Oxygen Therapy  SpO2 100 %  O2 Device Nasal Cannula  O2 Flow Rate (L/min) 2 L/min  During Hemodialysis Assessment  Blood Flow Rate (mL/min) 300 mL/min  Arterial Pressure (mmHg) -150 mmHg  Venous Pressure (mmHg) 220 mmHg  Transmembrane Pressure (mmHg) 60 mmHg  Ultrafiltration Rate (mL/min) 1390 mL/min  Dialysate Flow Rate (mL/min) 600 ml/min  Conductivity: Machine  14.1  HD Safety Checks Performed Yes  Intra-Hemodialysis Comments Tolerated well;Tx completed (UF 3868)  Fistula / Graft Left Upper arm Arteriovenous fistula  Placement Date/Time: 10/01/15 1110   Placed prior to admission: No  Orientation: Left  Access Location: Upper arm  Access Type: Arteriovenous fistula  Status Deaccessed

## 2017-12-30 NOTE — Progress Notes (Signed)
[place] at [place] NAME: Jacqueline Rodriguez    MR#:  [id number]  DATE OF BIRTH:  [birth year]  SUBJECTIVE:   BS much improved this a.m. and pt. Is off D10 gtt.    REVIEW OF SYSTEMS:    Review of Systems  Constitutional: Negative for chills and fever.  HENT: Negative for congestion and tinnitus.   Eyes: Negative for blurred vision and double vision.  Respiratory: Negative for cough, shortness of breath and wheezing.   Cardiovascular: Negative for chest pain, orthopnea and PND.  Gastrointestinal: Positive for constipation. Negative for abdominal pain, diarrhea, nausea and vomiting.  Genitourinary: Negative for dysuria and hematuria.  Neurological: Negative for dizziness, sensory change and focal weakness.  All other systems reviewed and are negative.   Nutrition: Renal with fluid restriction Tolerating Diet: Yes Tolerating PT: Await Eval.   DRUG ALLERGIES:   Allergies  Allergen Reactions  . Ambien [Zolpidem] Other (See Comments)    hallucinations    VITALS:  Blood pressure (!) 115/49, pulse 72, temperature 97.8 F (36.6 C), temperature source Oral, resp. rate 16, height 5\' 8"  (1.727 m), weight 96.8 kg, last menstrual period 09/18/2013, SpO2 100 %.  PHYSICAL EXAMINATION:   Physical Exam  GENERAL:  57 y.o.-year-old patient lying in bed in no acute distress.  EYES: Pupils equal, round, reactive to light and accommodation. No scleral icterus. Extraocular muscles intact. Pale Conjuctiva.  HEENT: Head atraumatic, normocephalic. Oropharynx and nasopharynx clear.  NECK:  Supple, no jugular venous distention. No thyroid enlargement, no tenderness.  LUNGS: Normal breath sounds bilaterally, no wheezing, rales, rhonchi. No use of accessory muscles of respiration.  CARDIOVASCULAR: S1, S2 normal. II/VI SEM at LSB, No rubs, or gallops.  ABDOMEN: Soft, nontender, nondistended. Bowel sounds present. No organomegaly or mass. + PD cath in place.    EXTREMITIES: No cyanosis, clubbing or edema b/l.    NEUROLOGIC: Cranial nerves II through XII are intact. No focal Motor or sensory deficits b/l.  Globally weak.  PSYCHIATRIC: The patient is alert and oriented x 3.  SKIN: No obvious rash, lesion, or ulcer.   Left upper ext. AV fistula with good bruit, thrill.   LABORATORY PANEL:   CBC Recent Labs  Lab 12/29/17 0533 12/30/17 0455  WBC 11.6*  --   HGB 6.8* 9.1*  HCT 21.5* 28.0*  PLT 246  --    ------------------------------------------------------------------------------------------------------------------  Chemistries  Recent Labs  Lab 12/29/17 0533  NA 128*  K 4.0  CL 92*  CO2 22  GLUCOSE 37*  BUN 43*  CREATININE 8.16*  CALCIUM 6.8*   ------------------------------------------------------------------------------------------------------------------  Cardiac Enzymes No results for input(s): TROPONINI in the last 168 hours. ------------------------------------------------------------------------------------------------------------------  RADIOLOGY:  US Abdomen Limited Ruq  Result Date: 12/30/2017 CLINICAL DATA:  Hypoalbuminemia EXAM: ULTRASOUND ABDOMEN LIMITED RIGHT UPPER QUADRANT COMPARISON:  Right upper quadrant ultrasound of December 30, 2013 FINDINGS: Gallbladder: The gallbladder is adequately distended with no evidence of stones, wall thickening, or pericholecystic fluid. There is no positive sonographic Murphy's sign. Common bile duct: Diameter: 4 mm Liver: No focal lesion identified. Within normal limits in parenchymal echogenicity. Portal vein is patent on color Doppler imaging with normal direction of blood flow towards the liver. There is a right pleural effusion. No ascites in the right upper quadrant is observed. IMPRESSION: Right pleural effusion. Normal appearance of the liver, gallbladder, and common bile duct. Electronically Signed   By: David  Martinique M.D.   On: 12/30/2017 09:02  ASSESSMENT AND PLAN:    57 year old female with past medical history of end-stage renal disease on hemodialysis, essential hypertension, diabetes, COPD, chronic diastolic CHF, history of pulmonary hypertension, peripheral vascular disease who was admitted to the hospital due to weakness and altered mental status and noted to have hypoglycemia.  1.  Altered mental status- metabolic encephalopathy secondary to severe hypoglycemia. Patient was transferred to the intensive care unit due to severe hypoglycemia but this has significantly improved and her mental status is also improved and is close to baseline now.  2.  Recurrent hypoglycemia-etiology unclear.  Likely due to her being on Glipizide and poor PO intake.  -Patient was transferred to the intensive care unit yesterday due to severe hypoglycemia with blood sugars of 12.  Much improved today and patient has been weaned off the dextrose drip. Right upper quadrant ultrasound is negative for any chronic liver disease.  Discussed with endocrinology over the phone yesterday and they also suspect this is likely secondary to her use of glipizide and underlying poor p.o. intake and her being on dialysis/chronic kidney disease. - follow BS closely and off IV solu-cortef now.   3.  End-stage renal disease on hemodialysis-nephrology has been consulted.  Patient is noncompliant with her PD.  Continue hemodialysis as per nephrology.  4.  Essential hypertension-continue Norvasc, clonidine  5.  Secondary hyperparathyroidism-continue Renvela.  6.  Depression-continue Zoloft.  7.  Neuropathy-continue gabapentin.  8. Constipation - cont. Lactulose.    All the records are reviewed and case discussed with Care Management/Social Worker. Management plans discussed with the patient, family and they are in agreement.  CODE STATUS: Full code  DVT Prophylaxis: Hep SQ  TOTAL TIME TAKING CARE OF THIS PATIENT: 30 minutes.   POSSIBLE D/C IN 2-3 DAYS, DEPENDING ON CLINICAL  CONDITION.   Henreitta Leber M.D on 12/30/2017 at 3:56 PM  Between 7am to 6pm - Pager - [phone]  After 6pm go to [email or web address] - Proofreader  [facility]  Office  [phone]  CC: Primary care physician; Patient, No Pcp Per

## 2017-12-30 NOTE — Progress Notes (Signed)
[place], [place] 12/30/17  Subjective:  Patient feels better today Successfully underwent dialysis yesterday.  Patient reports that she does not remember the treatment last name 2700 cc of fluid was removed This morning blood sugars are well controlled Patient's husband is at bedside.  He reports that patient did not actually take the glipizide.  He has taken the bottle away.  Patient had her right upper quadrant ultrasound this morning.  Results are pending.  Objective:  Vital signs in last 24 hours:  Temp:  [97.7 F (36.5 C)-98.4 F (36.9 C)] 97.7 F (36.5 C) (12/20 0315) Pulse Rate:  [60-84] 77 (12/20 0845) Resp:  [8-20] 19 (12/20 0845) BP: (69-163)/(46-148) 120/52 (12/20 0800) SpO2:  [92 %-100 %] 100 % (12/20 0845) Weight:  [95.2 kg-100.8 kg] 96.8 kg (12/20 0315)  Weight change: 7.656 kg Filed Weights   12/29/17 1519 12/29/17 2230 12/30/17 0315  Weight: 95.2 kg 100.8 kg 96.8 kg    Intake/Output:    Intake/Output Summary (Last 24 hours) at 12/30/2017 3976 Last data filed at 12/30/2017 0315 Gross per 24 hour  Intake 3570.23 ml  Output 2738 ml  Net 832.23 ml     Physical Exam: General:  Lying in the bed, no acute distress  HEENT  moist oral mucous membranes  Neck  supple  Pulm/lungs  clear to auscultation  CVS/Heart  regular rhythm  Abdomen:   Soft, nontender  Extremities:  Upper arm edema bilaterally  Neurologic:  Alert, oriented  Skin:  No acute rashes  Access:  Left upper arm AV fistula, good bruit       Basic Metabolic Panel:  Recent Labs  Lab 12/28/17 2016 12/29/17 0533  NA 130* 128*  K 4.9 4.0  CL 95* 92*  CO2 21* 22  GLUCOSE 115* 37*  BUN 41* 43*  CREATININE 8.18* 8.16*  CALCIUM 7.1* 6.8*  PHOS  --  7.5*     CBC: Recent Labs  Lab 12/28/17 2016 12/29/17 0533 12/30/17 0455  WBC 11.4* 11.6*  --   HGB 7.2* 6.8* 9.1*  HCT 22.0* 21.5* 28.0*  MCV 83.3 85.7  --   PLT 248 246  --       Lab Results   Component Value Date   HEPBSAG Negative 12/21/2017   HEPBSAB Non Reactive 12/21/2017   HEPBIGM Negative 09/08/2017      Microbiology:  Recent Results (from the past 240 hour(s))  CULTURE, BLOOD (ROUTINE X 2) w Reflex to ID Panel     Status: None   Collection Time: 12/21/17  1:21 PM  Result Value Ref Range Status   Specimen Description BLOOD RIGHT ANTECUBITAL  Final   Special Requests   Final    BOTTLES DRAWN AEROBIC AND ANAEROBIC Blood Culture adequate volume   Culture   Final    NO GROWTH 5 DAYS Performed at [facility] [address] [place], [place] [address]    Report Status 12/26/2017 FINAL  Final  CULTURE, BLOOD (ROUTINE X 2) w Reflex to ID Panel     Status: None   Collection Time: 12/21/17  1:39 PM  Result Value Ref Range Status   Specimen Description BLOOD RIGHT HAND  Final   Special Requests   Final    BOTTLES DRAWN AEROBIC AND ANAEROBIC Blood Culture adequate volume   Culture   Final    NO GROWTH 5 DAYS Performed at [facility], [address], [place], [place] [address]    Report Status 12/26/2017 FINAL  Final  MRSA PCR Screening     Status: None   Collection Time: 12/21/17  6:34 PM  Result Value Ref Range Status   MRSA by PCR NEGATIVE NEGATIVE Final    Comment:        The GeneXpert MRSA Assay (FDA approved for NASAL specimens only), is one component of a comprehensive MRSA colonization surveillance program. It is not intended to diagnose MRSA infection nor to guide or monitor treatment for MRSA infections. Performed at [facility], [address] [place], [place] [address]     Coagulation Studies: Recent Labs    12/29/17 0533  LABPROT 14.9  INR 1.18    Urinalysis: No results for input(s): COLORURINE, LABSPEC, PHURINE, GLUCOSEU, HGBUR, BILIRUBINUR, KETONESUR, PROTEINUR, UROBILINOGEN, NITRITE, LEUKOCYTESUR in the last 72 hours.  Invalid input(s): APPERANCEUR    Imaging: US Abdomen Limited Ruq  Result  Date: 12/30/2017 CLINICAL DATA:  Hypoalbuminemia EXAM: ULTRASOUND ABDOMEN LIMITED RIGHT UPPER QUADRANT COMPARISON:  Right upper quadrant ultrasound of December 30, 2013 FINDINGS: Gallbladder: The gallbladder is adequately distended with no evidence of stones, wall thickening, or pericholecystic fluid. There is no positive sonographic Murphy's sign. Common bile duct: Diameter: 4 mm Liver: No focal lesion identified. Within normal limits in parenchymal echogenicity. Portal vein is patent on color Doppler imaging with normal direction of blood flow towards the liver. There is a right pleural effusion. No ascites in the right upper quadrant is observed. IMPRESSION: Right pleural effusion. Normal appearance of the liver, gallbladder, and common bile duct. Electronically Signed   By: David  Martinique M.D.   On: 12/30/2017 09:02     Medications:   . sodium chloride    . sodium chloride    . dextrose 50 mL/hr at 12/30/17 0422   . sodium chloride   Intravenous Once  . amLODipine  5 mg Oral Daily  . aspirin EC  81 mg Oral Daily  . Chlorhexidine Gluconate Cloth  6 each Topical Q0600  . cloNIDine  0.2 mg Oral BID  . docusate sodium  100 mg Oral BID  . epoetin (EPOGEN/PROCRIT) injection  10,000 Units Intravenous Q T,Th,Sa-HD  . furosemide  40 mg Oral Daily  . heparin  5,000 Units Subcutaneous Q12H  . hydrocortisone sod succinate (SOLU-CORTEF) inj  100 mg Intravenous Q6H  . lactulose  20 g Oral QODAY  . mouth rinse  15 mL Mouth Rinse BID  . sertraline  100 mg Oral Daily  . sevelamer carbonate  1,600 mg Oral TID WC   sodium chloride, sodium chloride, acetaminophen **OR** acetaminophen, acetaminophen, albuterol, bisacodyl, gabapentin, heparin, lidocaine (PF), lidocaine-prilocaine, meclizine, ondansetron **OR** ondansetron (ZOFRAN) IV, ondansetron, pentafluoroprop-tetrafluoroeth  Assessment/ Plan:  57 y.o. Caucasian female with end-stage renal disease, history of noncompliance, currently hemodialysis,  diabetes, GERD, COPD, peripheral vascular disease presents for persistent hypoglycemia  [facility] N/TTS/left arm AV fistula  1.  End-stage renal disease 2.  Persistent hypoglycemia 3.  Anemia of chronic kidney disease 4.  Secondary hyperparathyroidism  Plan: Hemodialysis planned for tomorrow Continue dextrose 10% administration as needed for hypoglycemia stress dose steroids as cortisol level is borderline Right upper quadrant ultrasound.  Results are pending EPO with HD    LOS: 2 Gelisa Tieken Candiss Norse 12/20/20199:18 AM  [place], [place]  Note: This note was prepared with Dragon dictation. Any transcription errors are unintentional

## 2017-12-30 NOTE — Progress Notes (Signed)
Pre HD Treatment    12/29/17 2230  Vital Signs  Temp 98 F (36.7 C)  Temp Source Oral  Pulse Rate 71  Pulse Rate Source Monitor  Resp 12  BP (!) 128/57  BP Location Right Arm  BP Method Automatic  Patient Position (if appropriate) Lying  Oxygen Therapy  SpO2 100 %  O2 Device Nasal Cannula  O2 Flow Rate (L/min) 2 L/min  Pain Assessment  Pain Scale 0-10  Pain Score 0  Dialysis Weight  Weight 100.8 kg  Type of Weight Pre-Dialysis  Time-Out for Hemodialysis  What Procedure? HD  Pt Identifiers(min of two) First/Last Name;MRN/Account#;Pt's DOB(use if MRN/Acct# not available  Correct Site? Yes  Correct Side? Yes  Correct Procedure? Yes  Consents Verified? Yes  Rad Studies Available? N/A  Safety Precautions Reviewed? Yes  Engineer, civil (consulting) Number 4  Station Number  (ICU 8)  UF/Alarm Test Passed  Conductivity: Meter 14  Conductivity: Machine  14  pH 7.4  Reverse Osmosis Main  Normal Saline Lot Number [id number]  Dialyzer Lot Number [id number]  Disposable Set Lot Number [id number]  Machine Temperature 98.6 F (37 C)  Musician and Audible Yes  Blood Lines Intact and Secured Yes  Pre Treatment Patient Checks  Vascular access used during treatment Fistula  Hepatitis B Surface Antigen Results Negative  Date Hepatitis B Surface Antigen Drawn 12/21/17  Hepatitis B Surface Antibody  (<10)  Date Hepatitis B Surface Antibody Drawn 12/21/17  Hemodialysis Consent Verified Yes  Hemodialysis Standing Orders Initiated Yes  ECG (Telemetry) Monitor On Yes  Prime Ordered Normal Saline  Length of  DialysisTreatment -hour(s) 3.5 Hour(s)  Dialysis Treatment Comments Na 140  Dialyzer Elisio 17H NR  Dialysate 3K, 2.5 Ca  Variable Sodium Other (Comment)  Dialysis Anticoagulant None  Dialysate Flow Ordered 600  Blood Flow Rate Ordered 300 mL/min  Ultrafiltration Goal 3 Liters  Blood Products Ordered Packed Red Blood Cells  Dialysis Blood Pressure Support Ordered Normal  Saline  Education / Care Plan  Dialysis Education Provided Yes  Documented Education in Care Plan Yes  Fistula / Graft Left Upper arm Arteriovenous fistula  Placement Date/Time: 10/01/15 1110   Placed prior to admission: No  Orientation: Left  Access Location: Upper arm  Access Type: Arteriovenous fistula  Site Condition No complications  Fistula / Graft Assessment Present;Thrill;Bruit  Drainage Description None

## 2017-12-30 NOTE — Progress Notes (Signed)
Pre HD Assessment    12/29/17 2215  Neurological  Level of Consciousness Alert  Orientation Level Oriented X4  Respiratory  Respiratory Pattern Regular;Unlabored;Symmetrical  Chest Assessment Chest expansion symmetrical  Bilateral Breath Sounds Rhonchi  Cough Non-productive  Cardiac  Pulse Regular  Heart Sounds S1, S2;Murmur  Jugular Venous Distention (JVD) No  ECG Monitor Yes  Cardiac Rhythm NSR  Antiarrhythmic device No  Vascular  R Radial Pulse +2  L Radial Pulse +2  R Dorsalis Pedis Pulse +1  L Dorsalis Pedis Pulse +1  Edema Right lower extremity;Left upper extremity;Right upper extremity;Left lower extremity;Generalized  Generalized Edema +1  RUE Edema +2  LUE Edema +2  RLE Edema +2  LLE Edema +2  Integumentary  Integumentary (WDL) X  Skin Color Appropriate for ethnicity  Skin Integrity Ecchymosis;Catheter entry/exit site;Abrasion;MASD  Ecchymosis Location Other (Comment) (scattered)  Ecchymosis Location Orientation Other (Comment) (generalized body)  Musculoskeletal  Musculoskeletal (WDL) X  Generalized Weakness Yes  Gastrointestinal  Bowel Sounds Assessment Active  GU Assessment  Genitourinary (WDL) X (HD pt)  Genitourinary Symptoms Oliguria  Peritoneal Catheter Left lower abdomen  Placement Date/Time: (c) 12/21/17 1837   Catheter Location: Left lower abdomen  Site Assessment Clean;Dry;Intact  Drainage Description None  Catheter status Clamped  Dressing Gauze/Drain sponge  Psychosocial  Psychosocial (WDL) WDL

## 2017-12-30 NOTE — Progress Notes (Signed)
[place] Progress Note Patient Name: Jacqueline Rodriguez DOB: August 10, 1960 MRN: [id number]   Date of Service  12/30/2017  HPI/Events of Note  Pt hypoglycemic on the floors for which she was placed on D10 @150ml /hr.   Glucose trending up now 148 --> 164 --> 200.  eICU Interventions  Decrease rate of D10 to 74ml/hr.  Continue checking FS q1hr.  Pt is not on insulin at home, only on oral hypoglycemics.       Intervention Category Intermediate Interventions: Hyperglycemia - evaluation and treatment  Elsie Lincoln 12/30/2017, 4:20 AM

## 2017-12-30 NOTE — Progress Notes (Signed)
D10 stopped at 8am today and hourly BG remains stable in the 200s range, despite the fact that she was NPO this am for breakfast and so far has not eaten any lunch. VSS, Co headache this am , medicated for same with good effect. Now transferring to floor in stable condition

## 2017-12-30 NOTE — Progress Notes (Signed)
Post HD Treatment    12/30/17 0300  Neurological  Level of Consciousness Responds to Voice  Orientation Level Oriented X4  Respiratory  Respiratory Pattern Regular;Unlabored;Symmetrical  Chest Assessment Chest expansion symmetrical  Bilateral Breath Sounds Diminished  Cardiac  Pulse Regular  Heart Sounds S1, S2;Murmur  Jugular Venous Distention (JVD) No  Cardiac Rhythm NSR  Antiarrhythmic device No  Vascular  R Radial Pulse +2  L Radial Pulse +2  R Dorsalis Pedis Pulse +1  L Dorsalis Pedis Pulse +1  Integumentary  Integumentary (WDL) X  Skin Color Appropriate for ethnicity  Skin Integrity Ecchymosis;Catheter entry/exit site;Abrasion;MASD  Ecchymosis Location Other (Comment) (scattered)  Ecchymosis Location Orientation Other (Comment) (generalized body)  Gastrointestinal  Bowel Sounds Assessment Active  GU Assessment  Genitourinary (WDL) X (HD pt)  Genitourinary Symptoms Oliguria (HD patient)  Peritoneal Catheter Left lower abdomen  Placement Date/Time: (c) 12/21/17 1837   Catheter Location: Left lower abdomen  Site Assessment Clean;Dry;Intact  Drainage Description None  Catheter status Clamped  Dressing Gauze/Drain sponge  Dressing Status Clean;Dry;Intact  Psychosocial  Psychosocial (WDL) WDL

## 2017-12-31 LAB — BASIC METABOLIC PANEL
Anion gap: 11 (ref 5–15)
BUN: 32 mg/dL — ABNORMAL HIGH (ref 6–20)
CO2: 24 mmol/L (ref 22–32)
Calcium: 7 mg/dL — ABNORMAL LOW (ref 8.9–10.3)
Chloride: 91 mmol/L — ABNORMAL LOW (ref 98–111)
Creatinine, Ser: 6.38 mg/dL — ABNORMAL HIGH (ref 0.44–1.00)
GFR calc Af Amer: 8 mL/min — ABNORMAL LOW (ref >60)
GFR calc non Af Amer: 7 mL/min — ABNORMAL LOW (ref >60)
Glucose, Bld: 118 mg/dL — ABNORMAL HIGH (ref 70–99)
Potassium: 5 mmol/L (ref 3.5–5.1)
Sodium: 126 mmol/L — ABNORMAL LOW (ref 135–145)

## 2017-12-31 LAB — GLUCOSE, CAPILLARY
GLUCOSE-CAPILLARY: 118 mg/dL — AB (ref 70–99)
Glucose-Capillary: 126 mg/dL — ABNORMAL HIGH (ref 70–99)
Glucose-Capillary: 150 mg/dL — ABNORMAL HIGH (ref 70–99)
Glucose-Capillary: 98 mg/dL (ref 70–99)
Glucose-Capillary: 99 mg/dL (ref 70–99)

## 2017-12-31 MED ORDER — ASPIRIN 81 MG PO TBEC: 162.0000 mg | DELAYED_RELEASE_TABLET | Freq: Every day | ORAL | 0 refills | Status: AC

## 2017-12-31 NOTE — Progress Notes (Signed)
Pt stable talking to RN.   12/31/17 1705  During Hemodialysis Assessment  Blood Flow Rate (mL/min) 300 mL/min  Arterial Pressure (mmHg) -140 mmHg  Venous Pressure (mmHg) 130 mmHg  Transmembrane Pressure (mmHg) 60 mmHg  Ultrafiltration Rate (mL/min) 720 mL/min  Dialysate Flow Rate (mL/min) 600 ml/min  Conductivity: Machine  15.4  HD Safety Checks Performed Yes  Intra-Hemodialysis Comments Progressing as prescribed (865mls uf removed)

## 2017-12-31 NOTE — Progress Notes (Signed)
This note also relates to the following rows which could not be included: Pulse Rate - Cannot attach notes to unvalidated device data Resp - Cannot attach notes to unvalidated device data  Hd started  

## 2017-12-31 NOTE — Progress Notes (Signed)
Pt resting comfortbly    12/31/17 1845  During Hemodialysis Assessment  Blood Flow Rate (mL/min) 300 mL/min  Arterial Pressure (mmHg) -160 mmHg  Venous Pressure (mmHg) 120 mmHg  Transmembrane Pressure (mmHg) 60 mmHg  Ultrafiltration Rate (mL/min) 710 mL/min  Dialysate Flow Rate (mL/min) 600 ml/min  Conductivity: Machine  13.9  HD Safety Checks Performed Yes  Intra-Hemodialysis Comments Progressing as prescribed

## 2017-12-31 NOTE — Discharge Summary (Signed)
[facility] [place] NAME: Jacqueline Rodriguez    MR#:  [id number]  DATE OF BIRTH:  08-Jun-1960  DATE OF ADMISSION:  12/28/2017 ADMITTING PHYSICIAN: Sedalia Muta, MD  DATE OF DISCHARGE: 12/31/2016  PRIMARY CARE PHYSICIAN: Patient, No Pcp Per    ADMISSION DIAGNOSIS:  Hypoglycemia [E16.2]  DISCHARGE DIAGNOSIS:  Hypoglycemia due to poor po intake and diabetic meds--now improved AMS resolved SECONDARY DIAGNOSIS:   Past Medical History:  Diagnosis Date  . Anemia   . Chronic bronchitis ([place])   . Chronic diastolic CHF (congestive heart failure) ([place])   . CKD (chronic kidney disease), stage III ([place])   . COPD (chronic obstructive pulmonary disease) ([place])   . Diabetes mellitus with renal complications ([place])   . Essential hypertension   . GERD (gastroesophageal reflux disease)   . Obesity   . Peripheral vascular disease ([place])   . Pulmonary hypertension ([place])   . Renal insufficiency 09/19/2015   Stage 3 CKD. Beginning dialysis.    HOSPITAL COURSE:  57 year old female with past medical history of end-stage renal disease on hemodialysis, essential hypertension, diabetes, COPD, chronic diastolic CHF, history of pulmonary hypertension, peripheral vascular disease who was admitted to the hospital due to weakness and altered mental status and noted to have hypoglycemia.  1.  Altered mental status- metabolic encephalopathy secondary to severe hypoglycemia. Patient was transferred to the intensive care unit due to severe hypoglycemia but this has significantly improved and her mental status is also improved and is close to baseline now.-sugars better  2.  Recurrent hypoglycemia-etiology unclear.  Likely due to her being on Glipizide and poor PO intake.  -Dr Verdell Carmine had Discussed with endocrinology over the phone yesterday and they also suspect this is likely secondary to her use of glipizide and underlying poor p.o. intake and her being on  dialysis/chronic kidney disease. - follow BS closely and off IV solu-cortef now.  -Hgba1c is 5.5%--- pt advised NOT to take any po diabetic meds--she voiced understanding.  3.  End-stage renal disease on hemodialysis-nephrology has been consulted.  Patient is noncompliant with her PD.  Continue hemodialysis as per nephrology.  4.  Essential hypertension-continue Norvasc, clonidine  5.  Secondary hyperparathyroidism-continue Renvela.  6.  Depression-continue Zoloft.  7.  Neuropathy-continue gabapentin.  8. Constipation - cont. Lactulose.   Overall better. Pt wants to go home Will d/c her after HD today D/w Dr Sherryl Manges with plan  CONSULTS OBTAINED:  Treatment Team:  Henreitta Leber, MD Murlean Iba, MD  DRUG ALLERGIES:   Allergies  Allergen Reactions  . Ambien [Zolpidem] Other (See Comments)    hallucinations    DISCHARGE MEDICATIONS:   Allergies as of 12/31/2017      Reactions   Ambien [zolpidem] Other (See Comments)   hallucinations      Medication List    STOP taking these medications   calcium acetate 667 MG capsule Commonly known as:  PHOSLO   gentamicin ointment 0.1 % Commonly known as:  GARAMYCIN   glipiZIDE 5 MG tablet Commonly known as:  GLUCOTROL     TAKE these medications   acetaminophen 500 MG tablet Commonly known as:  TYLENOL Take 500 mg by mouth every 6 (six) hours as needed.   albuterol 108 (90 Base) MCG/ACT inhaler Commonly known as:  PROVENTIL HFA;VENTOLIN HFA Inhale 2 puffs into the lungs every 6 (six) hours as needed for wheezing or shortness of breath.   amLODipine 5 MG tablet Commonly known as:  NORVASC Take 5 mg by mouth daily.   aspirin 81 MG EC tablet Take 2 tablets (162 mg total) by mouth at bedtime.   cetirizine 10 MG tablet Commonly known as:  ZYRTEC Take 10 mg by mouth daily as needed for allergies.   cloNIDine 0.2 MG tablet Commonly known as:  CATAPRES Take 0.2 mg by mouth 2 (two) times daily.    furosemide 40 MG tablet Commonly known as:  LASIX Take 40 mg by mouth daily.   gabapentin 100 MG capsule Commonly known as:  NEURONTIN Take 100 mg by mouth 3 (three) times daily as needed. For neuropathy. (patient usually only takes one capsule a day)   GLUCOSAMINE-CHONDROITIN PO Take 4,000 mg by mouth daily.   lactulose 10 GM/15ML solution Commonly known as:  CHRONULAC Take 30 mLs by mouth every other day.   lidocaine-prilocaine cream Commonly known as:  EMLA Apply topically 3 (three) times a week. Apply 45 minutes prior to dialysis treatment three times a week   lisinopril 10 MG tablet Commonly known as:  PRINIVIL,ZESTRIL Take 1 tablet by mouth daily.   meclizine 25 MG tablet Commonly known as:  ANTIVERT Take 1 tablet (25 mg total) by mouth 3 (three) times daily as needed for dizziness. What changed:  when to take this   ondansetron 4 MG tablet Commonly known as:  ZOFRAN Take 1 tablet (4 mg total) by mouth every 6 (six) hours as needed for nausea.   sertraline 100 MG tablet Commonly known as:  ZOLOFT Take 1 tablet by mouth daily.   sevelamer carbonate 800 MG tablet Commonly known as:  RENVELA Take 1,600 mg by mouth 3 (three) times daily with meals.       If you experience worsening of your admission symptoms, develop shortness of breath, life threatening emergency, suicidal or homicidal thoughts you must seek medical attention immediately by calling 911 or calling your MD immediately  if symptoms less severe.  You Must read complete instructions/literature along with all the possible adverse reactions/side effects for all the Medicines you take and that have been prescribed to you. Take any new Medicines after you have completely understood and accept all the possible adverse reactions/side effects.   Please note  You were cared for by a hospitalist during your hospital stay. If you have any questions about your discharge medications or the care you received while  you were in the hospital after you are discharged, you can call the unit and asked to speak with the hospitalist on call if the hospitalist that took care of you is not available. Once you are discharged, your primary care physician will handle any further medical issues. Please note that NO REFILLS for any discharge medications will be authorized once you are discharged, as it is imperative that you return to your primary care physician (or establish a relationship with a primary care physician if you do not have one) for your aftercare needs so that they can reassess your need for medications and monitor your lab values. Today   SUBJECTIVE   Feels better. Had some pancake this am  VITAL SIGNS:  Blood pressure (!) 121/54, pulse 73, temperature 97.6 F (36.4 C), temperature source Oral, resp. rate 16, height 5\' 8"  (1.727 m), weight 96.5 kg, last menstrual period 09/18/2013, SpO2 96 %.  I/O:    Intake/Output Summary (Last 24 hours) at 12/31/2017 1312 Last data filed at 12/31/2017 0304 Gross per 24 hour  Intake 480 ml  Output 200 ml  Net  280 ml    PHYSICAL EXAMINATION:  GENERAL:  57 y.o.-year-old patient lying in the bed with no acute distress. obese EYES: Pupils equal, round, reactive to light and accommodation. No scleral icterus. Extraocular muscles intact.  HEENT: Head atraumatic, normocephalic. Oropharynx and nasopharynx clear.  NECK:  Supple, no jugular venous distention. No thyroid enlargement, no tenderness.  LUNGS: Normal breath sounds bilaterally, no wheezing, rales,rhonchi or crepitation. No use of accessory muscles of respiration.  CARDIOVASCULAR: S1, S2 normal. No murmurs, rubs, or gallops.  ABDOMEN: Soft, non-tender, non-distended. Bowel sounds present. No organomegaly or mass.  EXTREMITIES: No pedal edema, cyanosis, or clubbing. Dialysis access+ NEUROLOGIC: Cranial nerves II through XII are intact. Muscle strength 5/5 in all extremities. Sensation intact. Gait not checked.   PSYCHIATRIC: The patient is alert and oriented x 3.  SKIN: No obvious rash, lesion, or ulcer.   DATA REVIEW:   CBC  Recent Labs  Lab 12/29/17 0533 12/30/17 0455  WBC 11.6*  --   HGB 6.8* 9.1*  HCT 21.5* 28.0*  PLT 246  --     Chemistries  Recent Labs  Lab 12/31/17 0633  NA 126*  K 5.0  CL 91*  CO2 24  GLUCOSE 118*  BUN 32*  CREATININE 6.38*  CALCIUM 7.0*    Microbiology Results   Recent Results (from the past 240 hour(s))  CULTURE, BLOOD (ROUTINE X 2) w Reflex to ID Panel     Status: None   Collection Time: 12/21/17  1:21 PM  Result Value Ref Range Status   Specimen Description BLOOD RIGHT ANTECUBITAL  Final   Special Requests   Final    BOTTLES DRAWN AEROBIC AND ANAEROBIC Blood Culture adequate volume   Culture   Final    NO GROWTH 5 DAYS Performed at [facility], [address] [place], [place] [address]    Report Status 12/26/2017 FINAL  Final  CULTURE, BLOOD (ROUTINE X 2) w Reflex to ID Panel     Status: None   Collection Time: 12/21/17  1:39 PM  Result Value Ref Range Status   Specimen Description BLOOD RIGHT HAND  Final   Special Requests   Final    BOTTLES DRAWN AEROBIC AND ANAEROBIC Blood Culture adequate volume   Culture   Final    NO GROWTH 5 DAYS Performed at [facility], [address] [place], [place] [address]    Report Status 12/26/2017 FINAL  Final  MRSA PCR Screening     Status: None   Collection Time: 12/21/17  6:34 PM  Result Value Ref Range Status   MRSA by PCR NEGATIVE NEGATIVE Final    Comment:        The GeneXpert MRSA Assay (FDA approved for NASAL specimens only), is one component of a comprehensive MRSA colonization surveillance program. It is not intended to diagnose MRSA infection nor to guide or monitor treatment for MRSA infections. Performed at [facility], [address] [place], [place] [address]     RADIOLOGY:  US Abdomen Limited Ruq  Result Date: 12/30/2017 CLINICAL  DATA:  Hypoalbuminemia EXAM: ULTRASOUND ABDOMEN LIMITED RIGHT UPPER QUADRANT COMPARISON:  Right upper quadrant ultrasound of December 30, 2013 FINDINGS: Gallbladder: The gallbladder is adequately distended with no evidence of stones, wall thickening, or pericholecystic fluid. There is no positive sonographic Murphy's sign. Common bile duct: Diameter: 4 mm Liver: No focal lesion identified. Within normal limits in parenchymal echogenicity. Portal vein is patent on color Doppler imaging with normal direction of blood  flow towards the liver. There is a right pleural effusion. No ascites in the right upper quadrant is observed. IMPRESSION: Right pleural effusion. Normal appearance of the liver, gallbladder, and common bile duct. Electronically Signed   By: David  [place]ique M.D.   On: 12/30/2017 09:02     Management plans discussed with the patient, family and they are in agreement.  CODE STATUS:     Code Status Orders  (From admission, onward)         Start     Ordered   12/28/17 2323  Full code  Continuous     12/28/17 2322        Code Status History    Date Active Date Inactive Code Status Order ID Comments User Context   12/21/2017 1215 12/23/2017 0930 Full Code [id number]  Nicholes Mango, MD Inpatient   09/07/2017 1813 09/10/2017 2239 Full Code [id number]  Henreitta Leber, MD Inpatient   12/16/2016 0814 12/17/2016 1438 Full Code [id number]  Saundra Shelling, MD Inpatient   12/03/2016 1201 12/04/2016 [place] Full Code [id number]  Max Sane, MD Inpatient   09/13/2016 1629 09/17/2016 1817 Full Code [id number]  Epifanio Lesches, MD ED   09/08/2016 1631 09/09/2016 1823 Full Code [id number]  Fritzi Mandes, MD Inpatient   07/17/2015 1244 07/24/2015 1911 Full Code [id number]  Epifanio Lesches, MD ED   03/15/2015 1956 03/20/2015 1548 Full Code [id number]  Idelle Crouch, MD Inpatient      TOTAL TIME TAKING CARE OF THIS PATIENT: *40* minutes.    Fritzi Mandes M.D on 12/31/2017 at 1:12 PM  Between 7am to 6pm -  Pager - [phone] After 6pm go to [email or web address] - password [email or web address] [facility]  Office  [phone]  CC: Primary care physician; Patient, No Pcp Per

## 2017-12-31 NOTE — Progress Notes (Signed)
Pt alert, denies c/o UF removed 1015 mls    12/31/17 1715  During Hemodialysis Assessment  Blood Flow Rate (mL/min) 300 mL/min  Arterial Pressure (mmHg) -170 mmHg  Venous Pressure (mmHg) 130 mmHg  Transmembrane Pressure (mmHg) 60 mmHg  Ultrafiltration Rate (mL/min) 720 mL/min  Dialysate Flow Rate (mL/min) 600 ml/min  Conductivity: Machine  14.5  HD Safety Checks Performed Yes  Intra-Hemodialysis Comments Progressing as prescribed

## 2017-12-31 NOTE — Progress Notes (Signed)
[facility], [place] 12/31/17  Subjective:  Patient feels better today Blood sugars have been stable.  Able to eat without nausea or vomiting   Objective:  Vital signs in last 24 hours:  Temp:  [97.4 F (36.3 C)-98.2 F (36.8 C)] 97.6 F (36.4 C) (12/21 0253) Pulse Rate:  [72-77] 73 (12/21 0253) Resp:  [10-21] 16 (12/21 0253) BP: (105-121)/(49-66) 121/54 (12/21 0253) SpO2:  [96 %-100 %] 96 % (12/21 0253) Weight:  [96.5 kg] 96.5 kg (12/21 0253)  Weight change: 1.28 kg Filed Weights   12/29/17 2230 12/30/17 0315 12/31/17 0253  Weight: 100.8 kg 96.8 kg 96.5 kg    Intake/Output:    Intake/Output Summary (Last 24 hours) at 12/31/2017 1032 Last data filed at 12/31/2017 0304 Gross per 24 hour  Intake 600 ml  Output 200 ml  Net 400 ml     Physical Exam: General:  Sitting up in the chair, no acute distress  HEENT  moist oral mucous membranes  Neck  supple  Pulm/lungs  clear to auscultation  CVS/Heart  regular rhythm  Abdomen:   Soft, nontender  Extremities:  Upper arm edema bilaterally  Neurologic:  Alert, oriented  Skin:  No acute rashes  Access:  Left upper arm AV fistula, good bruit       Basic Metabolic Panel:  Recent Labs  Lab 12/28/17 2016 12/29/17 0533 12/31/17 0633  NA 130* 128* 126*  K 4.9 4.0 5.0  CL 95* 92* 91*  CO2 21* 22 24  GLUCOSE 115* 37* 118*  BUN 41* 43* 32*  CREATININE 8.18* 8.16* 6.38*  CALCIUM 7.1* 6.8* 7.0*  PHOS  --  7.5*  --      CBC: Recent Labs  Lab 12/28/17 2016 12/29/17 0533 12/30/17 0455  WBC 11.4* 11.6*  --   HGB 7.2* 6.8* 9.1*  HCT 22.0* 21.5* 28.0*  MCV 83.3 85.7  --   PLT 248 246  --       Lab Results  Component Value Date   HEPBSAG Negative 12/21/2017   HEPBSAB Non Reactive 12/21/2017   HEPBIGM Negative 09/08/2017      Microbiology:  Recent Results (from the past 240 hour(s))  CULTURE, BLOOD (ROUTINE X 2) w Reflex to ID Panel     Status: None   Collection Time:  12/21/17  1:21 PM  Result Value Ref Range Status   Specimen Description BLOOD RIGHT ANTECUBITAL  Final   Special Requests   Final    BOTTLES DRAWN AEROBIC AND ANAEROBIC Blood Culture adequate volume   Culture   Final    NO GROWTH 5 DAYS Performed at [facility], [address] [place], [place] [address]    Report Status 12/26/2017 FINAL  Final  CULTURE, BLOOD (ROUTINE X 2) w Reflex to ID Panel     Status: None   Collection Time: 12/21/17  1:39 PM  Result Value Ref Range Status   Specimen Description BLOOD RIGHT HAND  Final   Special Requests   Final    BOTTLES DRAWN AEROBIC AND ANAEROBIC Blood Culture adequate volume   Culture   Final    NO GROWTH 5 DAYS Performed at [facility], [address], [place],  [address]    Report Status 12/26/2017 FINAL  Final  MRSA PCR Screening     Status: None   Collection Time: 12/21/17  6:34 PM  Result Value Ref Range Status   MRSA by PCR NEGATIVE NEGATIVE Final    Comment:  The GeneXpert MRSA Assay (FDA approved for NASAL specimens only), is one component of a comprehensive MRSA colonization surveillance program. It is not intended to diagnose MRSA infection nor to guide or monitor treatment for MRSA infections. Performed at [facility], [address] [place], [place] [address]     Coagulation Studies: Recent Labs    12/29/17 0533  LABPROT 14.9  INR 1.18    Urinalysis: No results for input(s): COLORURINE, LABSPEC, PHURINE, GLUCOSEU, HGBUR, BILIRUBINUR, KETONESUR, PROTEINUR, UROBILINOGEN, NITRITE, LEUKOCYTESUR in the last 72 hours.  Invalid input(s): APPERANCEUR    Imaging: US Abdomen Limited Ruq  Result Date: 12/30/2017 CLINICAL DATA:  Hypoalbuminemia EXAM: ULTRASOUND ABDOMEN LIMITED RIGHT UPPER QUADRANT COMPARISON:  Right upper quadrant ultrasound of December 30, 2013 FINDINGS: Gallbladder: The gallbladder is adequately distended with no evidence of stones, wall thickening, or  pericholecystic fluid. There is no positive sonographic Murphy's sign. Common bile duct: Diameter: 4 mm Liver: No focal lesion identified. Within normal limits in parenchymal echogenicity. Portal vein is patent on color Doppler imaging with normal direction of blood flow towards the liver. There is a right pleural effusion. No ascites in the right upper quadrant is observed. IMPRESSION: Right pleural effusion. Normal appearance of the liver, gallbladder, and common bile duct. Electronically Signed   By: David  Martinique M.D.   On: 12/30/2017 09:02     Medications:   . sodium chloride    . sodium chloride     . sodium chloride   Intravenous Once  . amLODipine  5 mg Oral Daily  . aspirin EC  81 mg Oral Daily  . Chlorhexidine Gluconate Cloth  6 each Topical Q0600  . cloNIDine  0.2 mg Oral BID  . docusate sodium  100 mg Oral BID  . epoetin (EPOGEN/PROCRIT) injection  10,000 Units Intravenous Q T,Th,Sa-HD  . furosemide  40 mg Oral Daily  . heparin  5,000 Units Subcutaneous Q12H  . lactulose  20 g Oral QODAY  . mouth rinse  15 mL Mouth Rinse BID  . sertraline  100 mg Oral Daily  . sevelamer carbonate  1,600 mg Oral TID WC   sodium chloride, sodium chloride, acetaminophen **OR** acetaminophen, acetaminophen, albuterol, bisacodyl, gabapentin, guaiFENesin, heparin, lidocaine (PF), lidocaine-prilocaine, meclizine, ondansetron **OR** ondansetron (ZOFRAN) IV, ondansetron, pentafluoroprop-tetrafluoroeth  Assessment/ Plan:  57 y.o. Caucasian female with end-stage renal disease, history of noncompliance, currently hemodialysis, diabetes, GERD, COPD, peripheral vascular disease presents for persistent hypoglycemia  [facility] N/TTS/left arm AV fistula  1.  End-stage renal disease 2.  Persistent hypoglycemia 3.  Anemia of chronic kidney disease 4.  Secondary hyperparathyroidism  Plan: Hemodialysis today Expected to be discharged after hemodialysis. Hemoglobin A1c 5.5%.  May stay off of oral  hypoglycemics. EPO with HD    LOS: [place] 12/21/201910:32 AM  Joseph, Garnet  Note: This note was prepared with Dragon dictation. Any transcription errors are unintentional

## 2017-12-31 NOTE — Progress Notes (Signed)
PD catheter dressing changed, small amount of crusting present but easy to remove.  Patient denies any pain or discomfort at site.

## 2017-12-31 NOTE — Progress Notes (Signed)
This note also relates to the following rows which could not be included: Pulse Rate - Cannot attach notes to unvalidated device data Resp - Cannot attach notes to unvalidated device data BP - Cannot attach notes to unvalidated device data  Hd completed  

## 2017-12-31 NOTE — Progress Notes (Signed)
Pt resting comfortably , uf removed 1782 mls   12/31/17 1815  During Hemodialysis Assessment  Blood Flow Rate (mL/min) 300 mL/min  Arterial Pressure (mmHg) 1170 mmHg  Venous Pressure (mmHg) 120 mmHg  Transmembrane Pressure (mmHg) 60 mmHg  Ultrafiltration Rate (mL/min) 710 mL/min  Dialysate Flow Rate (mL/min) 600 ml/min  Conductivity: Machine  14.1  HD Safety Checks Performed Yes  Intra-Hemodialysis Comments Progressing as prescribed

## 2017-12-31 NOTE — Progress Notes (Signed)
Inaccurate BP reading , Pt was moving arm.

## 2017-12-31 NOTE — Progress Notes (Signed)
Pt watching Tv.   12/31/17 1900  During Hemodialysis Assessment  Blood Flow Rate (mL/min) 300 mL/min  Arterial Pressure (mmHg) -170 mmHg  Venous Pressure (mmHg) 130 mmHg  Transmembrane Pressure (mmHg) 60 mmHg  Ultrafiltration Rate (mL/min) 710 mL/min  Dialysate Flow Rate (mL/min) 600 ml/min  Conductivity: Machine  14.7  HD Safety Checks Performed Yes  Intra-Hemodialysis Comments Progressing as prescribed

## 2017-12-31 NOTE — Discharge Instructions (Addendum)
Hypoglycemia Hypoglycemia is when the sugar (glucose) level in your blood is too low. Signs of low blood sugar may include:  Feeling: ? Hungry. ? Worried or nervous (anxious). ? Sweaty and clammy. ? Confused. ? Dizzy. ? Sleepy. ? Sick to your stomach (nauseous).  Having: ? A fast heartbeat. ? A headache. ? A change in your vision. ? Tingling or no feeling (numbness) around your mouth, lips, or tongue. ? Jerky movements that you cannot control (seizure).  Having trouble with: ? Moving (coordination). ? Sleeping. ? Passing out (fainting). ? Getting upset easily (irritability). Low blood sugar can happen to people who have diabetes and people who do not have diabetes. Low blood sugar can happen quickly, and it can be an emergency. Treating low blood sugar Low blood sugar is often treated by eating or drinking something sugary right away, such as:  Fruit juice, 4-6 oz (120-150 mL).  Regular soda (not diet soda), 4-6 oz (120-150 mL).  Low-fat milk, 4 oz (120 mL).  Several pieces of hard candy.  Sugar or honey, 1 Tbsp (15 mL). Treating low blood sugar if you have diabetes If you can think clearly and swallow safely, follow the 15:15 rule:  Take 15 grams of a fast-acting carb (carbohydrate). Talk with your doctor about how much you should take.  Always keep a source of fast-acting carb with you, such as: ? Sugar tablets (glucose pills). Take 3-4 pills. ? 6-8 pieces of hard candy. ? 4-6 oz (120-150 mL) of fruit juice. ? 4-6 oz (120-150 mL) of regular (not diet) soda. ? 1 Tbsp (15 mL) honey or sugar.  Check your blood sugar 15 minutes after you take the carb.  If your blood sugar is still at or below 70 mg/dL (3.9 mmol/L), take 15 grams of a carb again.  If your blood sugar does not go above 70 mg/dL (3.9 mmol/L) after 3 tries, get help right away.  After your blood sugar goes back to normal, eat a meal or a snack within 1 hour.  Treating very low blood sugar If  your blood sugar is at or below 54 mg/dL (3 mmol/L), you have very low blood sugar (severe hypoglycemia). This may also cause:  Passing out.  Jerky movements you cannot control (seizure).  Losing consciousness (coma). This is an emergency. Do not wait to see if the symptoms will go away. Get medical help right away. Call your local emergency services (911 in the [place] Do not drive yourself to the hospital. If you have very low blood sugar and you cannot eat or drink, you may need a glucagon shot (injection). A family member or friend should learn how to check your blood sugar and how to give you a glucagon shot. Ask your doctor if you need to have a glucagon shot kit at home. Follow these instructions at home: General instructions  Take over-the-counter and prescription medicines only as told by your doctor.  Stay aware of your blood sugar as told by your doctor.  Limit alcohol intake to no more than 1 drink a day for nonpregnant women and 2 drinks a day for men. One drink equals 12 oz of beer (355 mL), 5 oz of wine (148 mL), or 1 oz of hard liquor (44 mL).  Keep all follow-up visits as told by your doctor. This is important. If you have diabetes:   Follow your diabetes care plan as told by your doctor. Make sure you: ? Know the signs of low blood  sugar. ? Take your medicines as told. ? Follow your exercise and meal plan. ? Eat on time. Do not skip meals. ? Check your blood sugar as often as told by your doctor. Always check it before and after exercise. ? Follow your sick day plan when you cannot eat or drink normally. Make this plan ahead of time with your doctor.  Share your diabetes care plan with: ? Your work or school. ? People you live with.  Check your pee (urine) for ketones: ? When you are sick. ? As told by your doctor.  Carry a card or wear jewelry that says you have diabetes. Contact a doctor if:  You have trouble keeping your blood sugar in your target  range.  You have low blood sugar often. Get help right away if:  You still have symptoms after you eat or drink something sugary.  Your blood sugar is at or below 54 mg/dL (3 mmol/L).  You have jerky movements that you cannot control.  You pass out. These symptoms may be an emergency. Do not wait to see if the symptoms will go away. Get medical help right away. Call your local emergency services (911 in the [place] Do not drive yourself to the hospital. Summary  Hypoglycemia happens when the level of sugar (glucose) in your blood is too low.  Low blood sugar can happen to people who have diabetes and people who do not have diabetes. Low blood sugar can happen quickly, and it can be an emergency.  Make sure you know the signs of low blood sugar and know how to treat it.  Always keep a source of sugar (fast-acting carb) with you to treat low blood sugar. This information is not intended to replace advice given to you by your health care provider. Make sure you discuss any questions you have with your health care provider. Document Released: 03/24/2009 Document Revised: 06/21/2017 Document Reviewed: 01/31/2015 Elsevier Interactive Patient Education  2019 [place] HD as before on your schedule Keep log of sugars at home DO NOT take any diabetes meds

## 2018-01-01 LAB — GLUCOSE, CAPILLARY
GLUCOSE-CAPILLARY: 163 mg/dL — AB (ref 70–99)
Glucose-Capillary: 135 mg/dL — ABNORMAL HIGH (ref 70–99)
Glucose-Capillary: 139 mg/dL — ABNORMAL HIGH (ref 70–99)
Glucose-Capillary: 155 mg/dL — ABNORMAL HIGH (ref 70–99)
Glucose-Capillary: 157 mg/dL — ABNORMAL HIGH (ref 70–99)

## 2018-01-01 MED ORDER — LORAZEPAM 2 MG/ML IJ SOLN
1.0000 mg | Freq: Once | INTRAMUSCULAR | Status: AC
  Administered 2018-01-01: 1 mg via INTRAVENOUS
  Filled 2018-01-01: qty 1

## 2018-01-01 NOTE — Progress Notes (Signed)
PT Cancellation Note  Patient Details Name: Jacqueline Rodriguez MRN: [id number] DOB: Apr 16, 1960   Cancelled Treatment:    Reason Eval/Treat Not Completed: Fatigue/lethargy limiting ability to participate.  Pt is asleep, has not eaten lunch yet.  Made two attempts to awaken her but she at most opens eyes and then closes them.  Was not at all verbal to acknowledge PT asking her to sit up on side of bed.  Will try again at another time.   Ramond Dial 01/01/2018, 2:06 PM   Mee Hives, PT MS Acute Rehab Dept. Number: [place] [facility] [place]

## 2018-01-01 NOTE — Progress Notes (Signed)
[facility] [place] NAME: Jacqueline Rodriguez    MR#:  [id number]  DATE OF BIRTH:  02-Apr-1960  SUBJECTIVE:  Very sleepy. Has meaningful conversation though. Weak. Husband in the room. Trying to eat BF. Picky about the food!  REVIEW OF SYSTEMS:   Review of Systems  Constitutional: Negative for chills, fever and weight loss.  HENT: Negative for ear discharge, ear pain and nosebleeds.   Eyes: Negative for blurred vision, pain and discharge.  Respiratory: Negative for sputum production, shortness of breath, wheezing and stridor.   Cardiovascular: Negative for chest pain, palpitations, orthopnea and PND.  Gastrointestinal: Negative for abdominal pain, diarrhea, nausea and vomiting.  Genitourinary: Negative for frequency and urgency.  Musculoskeletal: Negative for back pain and joint pain.  Neurological: Positive for weakness. Negative for sensory change, speech change and focal weakness.  Psychiatric/Behavioral: Negative for depression and hallucinations. The patient is not nervous/anxious.    Tolerating Diet:some Tolerating PT: pending  DRUG ALLERGIES:   Allergies  Allergen Reactions  . Ambien [Zolpidem] Other (See Comments)    hallucinations    VITALS:  Blood pressure (!) 168/70, pulse 87, temperature 98.3 F (36.8 C), temperature source Axillary, resp. rate 15, height 5\' 8"  (1.727 m), weight 96.6 kg, last menstrual period 09/18/2013, SpO2 (!) 88 %.  PHYSICAL EXAMINATION:   Physical Exam  GENERAL:  57 y.o.-year-old patient lying in the bed with no acute distress. obese EYES: Pupils equal, round, reactive to light and accommodation. No scleral icterus. Extraocular muscles intact.  HEENT: Head atraumatic, normocephalic. Oropharynx and nasopharynx clear.  NECK:  Supple, no jugular venous distention. No thyroid enlargement, no tenderness.  LUNGS: Normal breath sounds bilaterally, no wheezing, rales, rhonchi. No use of accessory muscles  of respiration.  CARDIOVASCULAR: S1, S2 normal. No murmurs, rubs, or gallops.  ABDOMEN: Soft, nontender, nondistended. Bowel sounds present. No organomegaly or mass.  EXTREMITIES: No cyanosis, clubbing or edema b/l.    NEUROLOGIC: Cranial nerves II through XII are intact. No focal Motor or sensory deficits b/l.   PSYCHIATRIC:  patient is alert and oriented x 2, sleepy SKIN: No obvious rash, lesion, or ulcer.   LABORATORY PANEL:  CBC Recent Labs  Lab 12/29/17 0533 12/30/17 0455  WBC 11.6*  --   HGB 6.8* 9.1*  HCT 21.5* 28.0*  PLT 246  --     Chemistries  Recent Labs  Lab 12/31/17 0633  NA 126*  K 5.0  CL 91*  CO2 24  GLUCOSE 118*  BUN 32*  CREATININE 6.38*  CALCIUM 7.0*   Cardiac Enzymes No results for input(s): TROPONINI in the last 168 hours. RADIOLOGY:  No results found. ASSESSMENT AND PLAN:  57 year old female with past medical history of end-stage renal disease on hemodialysis, essential hypertension, diabetes, COPD, chronic diastolic CHF, history of pulmonary hypertension, peripheral vascular disease who was admitted to the hospital due to weakness and altered mental status and noted to have hypoglycemia.  1. Altered mental status-metabolic encephalopathy secondary to severe hypoglycemia.  mental status is also improved and is close to baseline now.-sugars better -she is quiet sleepy -holding gabapentin, zoloft and meclizine  2. Recurrent hypoglycemia-etiology unclear.Likelydue to her being on Glipizide and poor PO intake. -Dr Verdell Carmine hadDiscussed with endocrinology over the phone yesterday and they also suspect this is likely secondary to her use of glipizide and underlying poor p.o. intake and her being on dialysis/chronic kidney disease. -follow BS closely and off IV solu-cortef now. -Hgba1c is 5.5%--- pt  advised NOT to take any po diabetic meds--she voiced understanding. -sugars ok  3. End-stage renal disease on hemodialysis-nephrology has  been consulted. Patient is noncompliant with her PD. Continue hemodialysis as per nephrology.  4. Essential hypertension-continue Norvasc, clonidine  5. Secondary hyperparathyroidism-continue Renvela.  6. Depression holding Zoloft.  7. Neuropathy-holding gabapentin.  PT to see pt  Case discussed with Care Management/Social Worker. Management plans discussed with the patient, family and they are in agreement.  CODE STATUS: *full*  DVT Prophylaxis: heparin  TOTAL TIME TAKING CARE OF THIS PATIENT: *30* minutes.  >50% time spent on counselling and coordination of care  POSSIBLE D/C IN *1-2* DAYS, DEPENDING ON CLINICAL CONDITION.  Note: This dictation was prepared with Dragon dictation along with smaller phrase technology. Any transcriptional errors that result from this process are unintentional.  Fritzi Mandes M.D on 01/01/2018 at 9:49 AM  Between 7am to 6pm - Pager - [phone]  After 6pm go to [email or web address] - password [email or web address] [facility]  Office  [phone]  CC: Primary care physician; Patient, No Pcp PerPatient ID: Jacqueline Rodriguez, female   DOB: 1960/10/14, 57 y.o.   MRN: [id number]

## 2018-01-01 NOTE — Plan of Care (Signed)
01/01/2018 3:35 PM  Mrs. Barlowe very lethargic and drowsy today. PT evaluation ordered, but patient was very drowsy when therapist. Therapist stated would return later to attempt patient ambulation. Continue to monitor.   Fuller Mandril, RN

## 2018-01-02 ENCOUNTER — Inpatient Hospital Stay: Payer: Medicare Other

## 2018-01-02 LAB — BLOOD GAS, ARTERIAL
Acid-Base Excess: 5.2 mmol/L — ABNORMAL HIGH (ref 0.0–2.0)
Bicarbonate: 26.9 mmol/L (ref 20.0–28.0)
FIO2: 0.21
O2 Saturation: 99.3 %
Patient temperature: 37
pCO2 arterial: 30 mmHg — ABNORMAL LOW (ref 32.0–48.0)
pH, Arterial: 7.56 — ABNORMAL HIGH (ref 7.350–7.450)
pO2, Arterial: 128 mmHg — ABNORMAL HIGH (ref 83.0–108.0)

## 2018-01-02 LAB — GLUCOSE, CAPILLARY
Glucose-Capillary: 136 mg/dL — ABNORMAL HIGH (ref 70–99)
Glucose-Capillary: 158 mg/dL — ABNORMAL HIGH (ref 70–99)

## 2018-01-02 LAB — PHOSPHORUS: PHOSPHORUS: 4 mg/dL (ref 2.5–4.6)

## 2018-01-02 MED ORDER — DIAZEPAM 2 MG PO TABS
2.0000 mg | ORAL_TABLET | Freq: Once | ORAL | Status: AC
  Administered 2018-01-02: 2 mg via ORAL
  Filled 2018-01-02: qty 1

## 2018-01-02 MED ORDER — SERTRALINE HCL 50 MG PO TABS: 100.0000 mg | ORAL_TABLET | Freq: Every day | ORAL | Status: DC

## 2018-01-02 MED ORDER — HYDRALAZINE HCL 20 MG/ML IJ SOLN
10.0000 mg | Freq: Four times a day (QID) | INTRAMUSCULAR | Status: DC | PRN
  Administered 2018-01-02: 10 mg via INTRAVENOUS
  Filled 2018-01-02: qty 1

## 2018-01-02 NOTE — Progress Notes (Signed)
Post HD assessment. Pt tolerated tx well without c/o or complication. Net UF 1518, goal met.    01/02/18 1637  Vital Signs  Temp (!) 97.5 F (36.4 C)  Temp Source Oral  Pulse Rate 89  Pulse Rate Source Monitor  Resp 13  BP (!) 168/83  BP Location Right Arm  BP Method Automatic  Patient Position (if appropriate) Lying  Oxygen Therapy  SpO2 95 %  O2 Device Room Air  Dialysis Weight  Weight 94.4 kg  Type of Weight Post-Dialysis  Post-Hemodialysis Assessment  Rinseback Volume (mL) 250 mL  KECN 42.3 V  Dialyzer Clearance Lightly streaked  Duration of HD Treatment -hour(s) 2.5 hour(s)  Hemodialysis Intake (mL) 500 mL  UF Total -Machine (mL) 2018 mL  Net UF (mL) 1518 mL  Tolerated HD Treatment Yes  AVG/AVF Arterial Site Held (minutes) 10 minutes  AVG/AVF Venous Site Held (minutes) 10 minutes  Education / Care Plan  Dialysis Education Provided Yes  Documented Education in Care Plan Yes  Fistula / Graft Left Upper arm Arteriovenous fistula  Placement Date/Time: 10/01/15 1110   Placed prior to admission: No  Orientation: Left  Access Location: Upper arm  Access Type: Arteriovenous fistula  Site Condition Other (Comment) (swelling)  Fistula / Graft Assessment Present;Thrill;Bruit  Status Deaccessed  Drainage Description None

## 2018-01-02 NOTE — Progress Notes (Signed)
HD tx start    01/02/18 1350  Vital Signs  Pulse Rate 69  Pulse Rate Source Monitor  Resp 11  BP (!) 154/73  BP Location Right Arm  BP Method Automatic  Patient Position (if appropriate) Lying  Oxygen Therapy  SpO2 90 %  O2 Device Room Air  During Hemodialysis Assessment  Blood Flow Rate (mL/min) 300 mL/min  Arterial Pressure (mmHg) -150 mmHg  Venous Pressure (mmHg) 160 mmHg  Transmembrane Pressure (mmHg) 50 mmHg  Ultrafiltration Rate (mL/min) 800 mL/min  Dialysate Flow Rate (mL/min) 800 ml/min  Conductivity: Machine  14  HD Safety Checks Performed Yes  Dialysis Fluid Bolus Normal Saline  Bolus Amount (mL) 250 mL  Intra-Hemodialysis Comments Tx initiated  Fistula / Graft Left Upper arm Arteriovenous fistula  Placement Date/Time: 10/01/15 1110   Placed prior to admission: No  Orientation: Left  Access Location: Upper arm  Access Type: Arteriovenous fistula  Status Accessed  Needle Size 16

## 2018-01-02 NOTE — Progress Notes (Signed)
Pre HD assessment    01/02/18 1339  Neurological  Level of Consciousness Alert  Orientation Level Oriented X4  Respiratory  Respiratory Pattern Regular;Unlabored  Chest Assessment Chest expansion symmetrical  Cough Non-productive;Congested  Cardiac  ECG Monitor Yes  Cardiac Rhythm NSR  Vascular  R Radial Pulse +2  L Radial Pulse +2  Edema Generalized;Right upper extremity;Left upper extremity;Right lower extremity;Left lower extremity  Integumentary  Integumentary (WDL) X  Skin Color Appropriate for ethnicity  Musculoskeletal  Musculoskeletal (WDL) X  Generalized Weakness Yes  Assistive Device None  GU Assessment  Genitourinary (WDL) X  Genitourinary Symptoms  (HD)  Psychosocial  Psychosocial (WDL) WDL

## 2018-01-02 NOTE — Progress Notes (Signed)
Pre HD assessment    01/02/18 1338  Vital Signs  Temp (!) 97.4 F (36.3 C)  Temp Source Oral  Pulse Rate 80  Pulse Rate Source Monitor  Resp 16  BP (!) 160/75  BP Location Right Arm  BP Method Automatic  Patient Position (if appropriate) Lying  Oxygen Therapy  SpO2 90 %  O2 Device Room Air  Pain Assessment  Pain Scale 0-10  Pain Score 0  Dialysis Weight  Weight 95 kg  Type of Weight Pre-Dialysis  Time-Out for Hemodialysis  What Procedure? HD  Pt Identifiers(min of two) First/Last Name;MRN/Account#  Correct Site? Yes  Correct Side? Yes  Correct Procedure? Yes  Consents Verified? Yes  Rad Studies Available? N/A  Safety Precautions Reviewed? Yes  Engineer, civil (consulting) Number  ([id number])  Station Number 1  UF/Alarm Test Passed  Conductivity: Meter 14  Conductivity: Machine  14.1  pH 7.4  Reverse Osmosis main  Normal Saline Lot Number [id number]  Dialyzer Lot Number [id number]  Disposable Set Lot Number [id number]  Machine Temperature 98.6 F (37 C)  Musician and Audible Yes  Blood Lines Intact and Secured Yes  Pre Treatment Patient Checks  Vascular access used during treatment Fistula  Hepatitis B Surface Antigen Results Negative  Date Hepatitis B Surface Antigen Drawn 12/21/17  Hepatitis B Surface Antibody  (<10)  Date Hepatitis B Surface Antibody Drawn 12/21/17  Hemodialysis Consent Verified Yes  Hemodialysis Standing Orders Initiated Yes  ECG (Telemetry) Monitor On Yes  Prime Ordered Normal Saline  Length of  DialysisTreatment -hour(s) 2.5 Hour(s)  Dialyzer Elisio 17H NR  Dialysate 2K, 2.5 Ca  Dialysis Anticoagulant None  Dialysate Flow Ordered 800  Blood Flow Rate Ordered 300 mL/min  Ultrafiltration Goal 1.5 Liters  Pre Treatment Labs Phosphorus  Dialysis Blood Pressure Support Ordered Normal Saline  Education / Care Plan  Dialysis Education Provided Yes  Documented Education in Care Plan Yes  Fistula / Graft Left Upper arm Arteriovenous fistula   Placement Date/Time: 10/01/15 1110   Placed prior to admission: No  Orientation: Left  Access Location: Upper arm  Access Type: Arteriovenous fistula  Site Condition Other (Comment) (swelling )  Fistula / Graft Assessment Present;Thrill;Bruit  Drainage Description None

## 2018-01-02 NOTE — Care Management Note (Signed)
Case Management Note  Patient Details  Name: Jacqueline Rodriguez MRN: [id number] Date of Birth: [birth year]   Patient to discharge home today. CMS Medicare.gov Compare Post Acute Care list reviewed with husband via phone (due to patient being off the floor for HD copy left at bedside).  States he does not have a preference of agency.  Referral made to [facility] with [place] .  BSC to be delivered to room prior to discharge.  Husband states that he transports patient to and from HD and appointments.  Denies any issues with transportation or obtaining medications.   Subjective/Objective:                    Action/Plan:   Expected Discharge Date:  01/02/18               Expected Discharge Plan:  [place]  In-House Referral:     Discharge planning Services  CM Consult  Post Acute Care Choice:  Durable Medical Equipment Choice offered to:  Spouse  DME Arranged:  Gilford Rile rolling DME Agency:  [facility] Arranged:  RN, PT, Nurse's Aide [place] Agency:  [facility]  Status of Service:  Completed, signed off  If discussed at [place] of Stay Meetings, dates discussed:    Additional Comments:  Jacqueline Sessions, RN 01/02/2018, 2:23 PM

## 2018-01-02 NOTE — Progress Notes (Signed)
[place], [place] 01/02/18  Subjective:  Patient did undergo hemodialysis yesterday. She will undergo dialysis today to keep her on schedule with the Christmas holidays as well. Working with physical therapy today.   Objective:  Vital signs in last 24 hours:  Temp:  [98.2 F (36.8 C)-98.4 F (36.9 C)] 98.3 F (36.8 C) (12/23 1246) Pulse Rate:  [77-86] 77 (12/23 1246) Resp:  [20-22] 22 (12/23 0614) BP: (161-181)/(71-78) 164/72 (12/23 1246) SpO2:  [93 %-95 %] 93 % (12/23 1246) Weight:  [95.4 kg] 95.4 kg (12/23 0500)  Weight change: -1.2 kg Filed Weights   12/31/17 0253 01/01/18 0358 01/02/18 0500  Weight: 96.5 kg 96.6 kg 95.4 kg    Intake/Output:    Intake/Output Summary (Last 24 hours) at 01/02/2018 1301 Last data filed at 01/02/2018 0900 Gross per 24 hour  Intake 295 ml  Output 100 ml  Net 195 ml     Physical Exam: General:  Sitting up in the chair, no acute distress  HEENT  moist oral mucous membranes  Neck  supple  Pulm/lungs  clear to auscultation  CVS/Heart  regular rhythm  Abdomen:   Soft, nontender  Extremities:  Upper arm edema bilaterally  Neurologic:  Alert, oriented  Skin:  No acute rashes  Access:  Left upper arm AV fistula, good bruit       Basic Metabolic Panel:  Recent Labs  Lab 12/28/17 2016 12/29/17 0533 12/31/17 0633  NA 130* 128* 126*  K 4.9 4.0 5.0  CL 95* 92* 91*  CO2 21* 22 24  GLUCOSE 115* 37* 118*  BUN 41* 43* 32*  CREATININE 8.18* 8.16* 6.38*  CALCIUM 7.1* 6.8* 7.0*  PHOS  --  7.5*  --      CBC: Recent Labs  Lab 12/28/17 2016 12/29/17 0533 12/30/17 0455  WBC 11.4* 11.6*  --   HGB 7.2* 6.8* 9.1*  HCT 22.0* 21.5* 28.0*  MCV 83.3 85.7  --   PLT 248 246  --       Lab Results  Component Value Date   HEPBSAG Negative 12/21/2017   HEPBSAB Non Reactive 12/21/2017   HEPBIGM Negative 09/08/2017      Microbiology:  No results found for this or any previous visit (from the past  240 hour(s)).  Coagulation Studies: No results for input(s): LABPROT, INR in the last 72 hours.  Urinalysis: No results for input(s): COLORURINE, LABSPEC, PHURINE, GLUCOSEU, HGBUR, BILIRUBINUR, KETONESUR, PROTEINUR, UROBILINOGEN, NITRITE, LEUKOCYTESUR in the last 72 hours.  Invalid input(s): APPERANCEUR    Imaging: Dg Chest 2 View  Result Date: 01/02/2018 CLINICAL DATA:  Acute onset of lethargy. Labored breathing. EXAM: CHEST - 2 VIEW COMPARISON:  Chest radiograph performed 12/21/2017 FINDINGS: The lungs are well-aerated. Vascular congestion is noted. Increased interstitial markings raise concern for pulmonary edema. Small bilateral pleural effusions are noted. There is no evidence of pneumothorax. The heart is mildly enlarged. No acute osseous abnormalities are seen. IMPRESSION: Vascular congestion and mild cardiomegaly. Increased interstitial markings raise concern for pulmonary edema. Small bilateral pleural effusions noted. Electronically Signed   By: Garald Balding M.D.   On: 01/02/2018 01:09     Medications:    . amLODipine  5 mg Oral Daily  . aspirin EC  81 mg Oral Daily  . Chlorhexidine Gluconate Cloth  6 each Topical Q0600  . cloNIDine  0.2 mg Oral BID  . docusate sodium  100 mg Oral BID  . epoetin (EPOGEN/PROCRIT) injection  10,000 Units Intravenous Q T,Th,Sa-HD  .  furosemide  40 mg Oral Daily  . heparin  5,000 Units Subcutaneous Q12H  . lactulose  20 g Oral QODAY  . mouth rinse  15 mL Mouth Rinse BID  . sertraline  100 mg Oral QHS  . sevelamer carbonate  1,600 mg Oral TID WC   acetaminophen **OR** acetaminophen, acetaminophen, albuterol, bisacodyl, guaiFENesin, hydrALAZINE, ondansetron **OR** ondansetron (ZOFRAN) IV, ondansetron  Assessment/ Plan:  57 y.o. Caucasian female with end-stage renal disease, history of noncompliance, currently hemodialysis, diabetes, GERD, COPD, peripheral vascular disease presents for persistent hypoglycemia  [facility] N/TTS/left  arm AV fistula  1.  End-stage renal disease 2.  Persistent hypoglycemia 3.  Anemia of chronic kidney disease 4.  Secondary hyperparathyroidism  Plan: Patient did have yesterday but to keep her on schedule with her outpatient unit we will plan for a short dialysis treatment today.  Thereafter her next dialysis treatment will be on Thursday as an outpatient.  She is working with physical therapy today as well.  Continue to monitor hemoglobin as well as a bone mineral metabolism parameters as an outpatient.    LOS: 5 Shaft Corigliano 12/23/20191:01 PM  [place], [place]  Note: This note was prepared with Dragon dictation. Any transcription errors are unintentional

## 2018-01-02 NOTE — Care Management Important Message (Signed)
Copy of signed Medicare IM left with patient in room. 

## 2018-01-02 NOTE — Progress Notes (Signed)
Discharge teaching given to patient, patient verbalized understanding and had no questions. Patient IV removed. Patient will be transported home by family. All patient belongings gathered prior to leaving.  

## 2018-01-02 NOTE — Progress Notes (Signed)
Post HD assessment    01/02/18 1635  Neurological  Level of Consciousness Responds to Voice  Orientation Level Oriented X4  Respiratory  Respiratory Pattern Regular;Unlabored  Chest Assessment Chest expansion symmetrical  Cough Non-productive  Cardiac  ECG Monitor Yes  Cardiac Rhythm NSR  Vascular  R Radial Pulse +2  L Radial Pulse +2  Edema Generalized;Right upper extremity;Left upper extremity;Right lower extremity;Left lower extremity  Integumentary  Integumentary (WDL) X  Skin Color Appropriate for ethnicity  Musculoskeletal  Musculoskeletal (WDL) X  Generalized Weakness Yes  Assistive Device None  GU Assessment  Genitourinary (WDL) X  Genitourinary Symptoms  (HD)  Psychosocial  Psychosocial (WDL) WDL

## 2018-01-02 NOTE — Discharge Summary (Signed)
[place] [facility] [place] NAME: Jacqueline Rodriguez    MR#:  [id number]  DATE OF BIRTH:  May 27, 1960  DATE OF ADMISSION:  12/28/2017 ADMITTING PHYSICIAN: Sedalia Muta, MD  DATE OF DISCHARGE: 01/02/2018  PRIMARY CARE PHYSICIAN: Patient, No Pcp Per    ADMISSION DIAGNOSIS:  Hypoglycemia [E16.2]  DISCHARGE DIAGNOSIS:  altered mental status suspected due to hypoglycemia improved  SECONDARY DIAGNOSIS:   Past Medical History:  Diagnosis Date  . Anemia   . Chronic bronchitis ([place])   . Chronic diastolic CHF (congestive heart failure) ([place])   . CKD (chronic kidney disease), stage III ([place])   . COPD (chronic obstructive pulmonary disease) ([place])   . Diabetes mellitus with renal complications ([place])   . Essential hypertension   . GERD (gastroesophageal reflux disease)   . Obesity   . Peripheral vascular disease ([place])   . Pulmonary hypertension ([place])   . Renal insufficiency 09/19/2015   Stage 3 CKD. Beginning dialysis.    HOSPITAL COURSE:   57 year old female with past medical history of end-stage renal disease on hemodialysis, essential hypertension, diabetes, COPD, chronic diastolic CHF, history of pulmonary hypertension, peripheral vascular disease who was admitted to the hospital due to weakness and altered mental status and noted to have hypoglycemia.  1. Altered mental status-metabolic encephalopathy secondary to severe hypoglycemia. Patient was transferred to the intensive care unit due to severe hypoglycemia but this has significantly improved and her mental status is also improved and is close to baseline now.-sugars better  2. Recurrent hypoglycemia-etiology unclear.Likelydue to her being on Glipizide and poor PO intake. -Dr Verdell Carmine hadDiscussed with endocrinology over the phone and they also suspect this is likely secondary to her use of glipizide and underlying poor p.o. intake and her being on dialysis/chronic kidney  disease. -follow BS closely and off IV solu-cortef now. -Hgba1c is 5.5%--- pt advised NOT to take any po diabetic meds--she voiced understanding.  3. End-stage renal disease on hemodialysis-nephrology has been consulted. Patient is noncompliant with her PD. Continue hemodialysis as per nephrology.  4. Essential hypertension-continue Norvasc, clonidine, hydralazine, lisinopril  5. Secondary hyperparathyroidism-continue Renvela.  6. Depression-continue Zoloft.  7. Neuropathy-continue gabapentin.  8.Constipation - cont. Lactulose.  Physical therapy thinks patient would benefit from rehab since she she appears deconditioned. However patient is adamant she'll go home. Will arrange home health PT RN and aide CONSULTS OBTAINED:  Treatment Team:  Henreitta Leber, MD Murlean Iba, MD Fritzi Mandes, MD  DRUG ALLERGIES:   Allergies  Allergen Reactions  . Ambien [Zolpidem] Other (See Comments)    hallucinations    DISCHARGE MEDICATIONS:   Allergies as of 01/02/2018      Reactions   Ambien [zolpidem] Other (See Comments)   hallucinations      Medication List    STOP taking these medications   calcium acetate 667 MG capsule Commonly known as:  PHOSLO   gentamicin ointment 0.1 % Commonly known as:  GARAMYCIN   glipiZIDE 5 MG tablet Commonly known as:  GLUCOTROL     TAKE these medications   acetaminophen 500 MG tablet Commonly known as:  TYLENOL Take 500 mg by mouth every 6 (six) hours as needed.   albuterol 108 (90 Base) MCG/ACT inhaler Commonly known as:  PROVENTIL HFA;VENTOLIN HFA Inhale 2 puffs into the lungs every 6 (six) hours as needed for wheezing or shortness of breath. What changed:  Another medication with the same name was removed. Continue taking this medication, and follow the  directions you see here.   amLODipine 5 MG tablet Commonly known as:  NORVASC Take 5 mg by mouth daily.   aspirin 81 MG EC tablet Take 2 tablets (162 mg total)  by mouth at bedtime.   cetirizine 10 MG tablet Commonly known as:  ZYRTEC Take 10 mg by mouth daily as needed for allergies.   cloNIDine 0.2 MG tablet Commonly known as:  CATAPRES Take 0.2 mg by mouth 2 (two) times daily.   docusate sodium 100 MG capsule Commonly known as:  COLACE Take 100 mg by mouth 2 (two) times daily.   furosemide 40 MG tablet Commonly known as:  LASIX Take 40 mg by mouth daily.   gabapentin 100 MG capsule Commonly known as:  NEURONTIN Take 100 mg by mouth 3 (three) times daily as needed. For neuropathy. (patient usually only takes one capsule a day)   GLUCOSAMINE-CHONDROITIN PO Take 4,000 mg by mouth daily.   hydrALAZINE 25 MG tablet Commonly known as:  APRESOLINE Take 25 mg by mouth 3 (three) times daily.   isosorbide mononitrate 30 MG 24 hr tablet Commonly known as:  IMDUR Take 1 tablet by mouth daily.   lactulose 10 GM/15ML solution Commonly known as:  CHRONULAC Take 30 mLs by mouth every other day.   lidocaine-prilocaine cream Commonly known as:  EMLA Apply topically 3 (three) times a week. Apply 45 minutes prior to dialysis treatment three times a week   lisinopril 10 MG tablet Commonly known as:  PRINIVIL,ZESTRIL Take 1 tablet by mouth daily.   meclizine 25 MG tablet Commonly known as:  ANTIVERT Take 1 tablet (25 mg total) by mouth 3 (three) times daily as needed for dizziness. What changed:  when to take this   ondansetron 4 MG tablet Commonly known as:  ZOFRAN Take 1 tablet (4 mg total) by mouth every 6 (six) hours as needed for nausea.   sertraline 100 MG tablet Commonly known as:  ZOLOFT Take 1 tablet by mouth daily.   sevelamer carbonate 800 MG tablet Commonly known as:  RENVELA Take 1,600 mg by mouth 3 (three) times daily with meals.       If you experience worsening of your admission symptoms, develop shortness of breath, life threatening emergency, suicidal or homicidal thoughts you must seek medical attention  immediately by calling 911 or calling your MD immediately  if symptoms less severe.  You Must read complete instructions/literature along with all the possible adverse reactions/side effects for all the Medicines you take and that have been prescribed to you. Take any new Medicines after you have completely understood and accept all the possible adverse reactions/side effects.   Please note  You were cared for by a hospitalist during your hospital stay. If you have any questions about your discharge medications or the care you received while you were in the hospital after you are discharged, you can call the unit and asked to speak with the hospitalist on call if the hospitalist that took care of you is not available. Once you are discharged, your primary care physician will handle any further medical issues. Please note that NO REFILLS for any discharge medications will be authorized once you are discharged, as it is imperative that you return to your primary care physician (or establish a relationship with a primary care physician if you do not have one) for your aftercare needs so that they can reassess your need for medications and monitor your lab values. Today   SUBJECTIVE  Out in the chair. Feels little better. Wants to go home. VITAL SIGNS:  Blood pressure (!) 154/73, pulse 69, temperature (!) 97.4 F (36.3 C), temperature source Oral, resp. rate 11, height 5\' 8"  (1.727 m), weight 95 kg, last menstrual period 09/18/2013, SpO2 90 %.  I/O:    Intake/Output Summary (Last 24 hours) at 01/02/2018 1403 Last data filed at 01/02/2018 0900 Gross per 24 hour  Intake 295 ml  Output 100 ml  Net 195 ml    PHYSICAL EXAMINATION:  GENERAL:  57 y.o.-year-old patient lying in the bed with no acute distress. Obese EYES: Pupils equal, round, reactive to light and accommodation. No scleral icterus. Extraocular muscles intact.  HEENT: Head atraumatic, normocephalic. Oropharynx and nasopharynx  clear.  NECK:  Supple, no jugular venous distention. No thyroid enlargement, no tenderness.  LUNGS: Normal breath sounds bilaterally, no wheezing, rales,rhonchi or crepitation. No use of accessory muscles of respiration.  CARDIOVASCULAR: S1, S2 normal. No murmurs, rubs, or gallops.  ABDOMEN: Soft, non-tender, non-distended. Bowel sounds present. No organomegaly or mass.  EXTREMITIES: No pedal edema, cyanosis, or clubbing.  NEUROLOGIC: Cranial nerves II through XII are intact. Muscle strength 5/5 in all extremities. Sensation intact. Gait not checked.  PSYCHIATRIC: The patient is alert and oriented x 3.  SKIN: No obvious rash, lesion, or ulcer.   DATA REVIEW:   CBC  Recent Labs  Lab 12/29/17 0533 12/30/17 0455  WBC 11.6*  --   HGB 6.8* 9.1*  HCT 21.5* 28.0*  PLT 246  --     Chemistries  Recent Labs  Lab 12/31/17 0633  NA 126*  K 5.0  CL 91*  CO2 24  GLUCOSE 118*  BUN 32*  CREATININE 6.38*  CALCIUM 7.0*    Microbiology Results   No results found for this or any previous visit (from the past 240 hour(s)).  RADIOLOGY:  Dg Chest 2 View  Result Date: 01/02/2018 CLINICAL DATA:  Acute onset of lethargy. Labored breathing. EXAM: CHEST - 2 VIEW COMPARISON:  Chest radiograph performed 12/21/2017 FINDINGS: The lungs are well-aerated. Vascular congestion is noted. Increased interstitial markings raise concern for pulmonary edema. Small bilateral pleural effusions are noted. There is no evidence of pneumothorax. The heart is mildly enlarged. No acute osseous abnormalities are seen. IMPRESSION: Vascular congestion and mild cardiomegaly. Increased interstitial markings raise concern for pulmonary edema. Small bilateral pleural effusions noted. Electronically Signed   By: Garald Balding M.D.   On: 01/02/2018 01:09     Management plans discussed with the patient, family and they are in agreement.  CODE STATUS:     Code Status Orders  (From admission, onward)         Start      Ordered   12/28/17 2323  Full code  Continuous     12/28/17 2322        Code Status History    Date Active Date Inactive Code Status Order ID Comments User Context   12/21/2017 1215 12/23/2017 0930 Full Code [id number]  Nicholes Mango, MD Inpatient   09/07/2017 1813 09/10/2017 2239 Full Code [id number]  Henreitta Leber, MD Inpatient   12/16/2016 0814 12/17/2016 1438 Full Code [id number]  Saundra Shelling, MD Inpatient   12/03/2016 1201 12/04/2016 1850 Full Code [id number]  Max Sane, MD Inpatient   09/13/2016 1629 09/17/2016 1817 Full Code [id number]  Epifanio Lesches, MD ED   09/08/2016 1631 09/09/2016 1823 Full Code [id number]  Fritzi Mandes, MD Inpatient   07/17/2015 1244 07/24/2015 1911 Full Code [id number]  Epifanio Lesches, MD ED   03/15/2015 1956 03/20/2015 1548 Full Code [id number]  Idelle Crouch, MD Inpatient      TOTAL TIME TAKING CARE OF THIS PATIENT: *40* minutes.    Fritzi Mandes M.D on 01/02/2018 at 2:03 PM  Between 7am to 6pm - Pager - [phone] After 6pm go to [email or web address] - password [email or web address]  Office  [phone]  CC: Primary care physician; Patient, No Pcp Per

## 2018-01-02 NOTE — Progress Notes (Signed)
HD tx end   01/02/18 1630  Vital Signs  Pulse Rate 85  Pulse Rate Source Monitor  Resp 12  BP (!) 178/71  BP Location Right Arm  BP Method Automatic  Patient Position (if appropriate) Lying  Oxygen Therapy  SpO2 92 %  O2 Device Room Air  During Hemodialysis Assessment  Dialysis Fluid Bolus Normal Saline  Bolus Amount (mL) 250 mL  Intra-Hemodialysis Comments Tx completed

## 2018-01-02 NOTE — Progress Notes (Signed)
Pt SAT's 97 sitting on the bed, when up to [facility] SAT's at 90 but returns to 97-98 when back at rest.

## 2018-01-03 NOTE — Evaluation (Signed)
Physical Therapy Evaluation Patient Details Name: Jacqueline Rodriguez MRN: [id number] DOB: 1960-04-04 Today's Date: 01/03/2018   History of Present Illness  presented to ER secondary to generalized weakness, AMS; admitted for management of hypoglycemia  Clinical Impression  Patient sleeping soundly upon arrival to room, requiring extensive cuing from therapist to awaken and maintain alertness throughout session.  Oriented to basic information and follows simple commands; question full insight into deficits and overall safety needs. Generally weak and deconditioned throughout all extremities; denies pain.  Currently requiring min/mod assist for sit/stand, basic transfers and short-distance gait (12') with RW.  Slow and effortful with poor balance, poor functional endurance.  Requires constant physical assist for safety.  Limited by fatigue with mobility. Would benefit from skilled PT to address above deficits and promote optimal return to PLOF; Recommend transition to [place], [place], [place], [facility] and 24/7 supervision/assist upon discharge from acute hospitalization    Follow Up Recommendations Home health PT;Supervision/Assistance - 24 hour(HHOT, HHRN, [facility]ide)    Equipment Recommendations  3in1 (PT)    Recommendations for Other Services       Precautions / Restrictions Precautions Precautions: Fall Restrictions Weight Bearing Restrictions: No      Mobility  Bed Mobility               General bed mobility comments: seated in recliner beginning/end of treatment session  Transfers Overall transfer level: Needs assistance Equipment used: Rolling walker (2 wheeled) Transfers: Sit to/from Stand Sit to Stand: Min assist;Mod assist         General transfer comment: extensive cuing for mechanics with movement transition  Ambulation/Gait Ambulation/Gait assistance: Min assist Gait Distance (Feet): 12 Feet Assistive device: Rolling walker (2 wheeled)       General Gait Details:  forward flexed posture with heavy WBing bilat UEs; short, choppy steps with poor balance reactions; limited foot clearance; requires +1 hands-on assist at all times for safety.  Distance limited by fatigue  Stairs            Wheelchair Mobility    Modified Rankin (Stroke Patients Only)       Balance Overall balance assessment: Needs assistance Sitting-balance support: No upper extremity supported;Feet supported Sitting balance-Leahy Scale: Fair     Standing balance support: Bilateral upper extremity supported Standing balance-Leahy Scale: Poor Standing balance comment: requires UE support on RW at all times                             Pertinent Vitals/Pain Pain Assessment: No/denies pain    Home Living Family/patient expects to be discharged to:: Private residence Living Arrangements: Spouse/significant other   Type of Home: House Home Access: Ramped entrance     Home Layout: One level Home Equipment: Environmental consultant - 2 wheels;Wheelchair - manual      Prior Function Level of Independence: Needs assistance         Comments: Patient reports limited household mobility at baseline, intermittent use of RW.  Husband and son assist as needed with functional mobility and provide assist to/from dialysis [facility] for longer distances outside of the home).     Hand Dominance        Extremity/Trunk Assessment   Upper Extremity Assessment Upper Extremity Assessment: Generalized weakness(grossly at least 4-/5 throughout)    Lower Extremity Assessment Lower Extremity Assessment: Generalized weakness(grossly 4-/5 throughout)       Communication   Communication: No difficulties(speech intermitently sluggish due to fatigue)  Cognition Arousal/Alertness:  Awake/alert Behavior During Therapy: Flat affect Overall Cognitive Status: No family/caregiver present to determine baseline cognitive functioning                                 General Comments:  oriented to self, location and general situation; question full comprehension of medical situation, mobility/safety needs      General Comments      Exercises Other Exercises Other Exercises: Sit/stand with RW x5 reps, mod progressing to min assist with cuing/education related to mechanics and safety.  Will continue to reinforce to maximize carry-over   Assessment/Plan    PT Assessment Patient needs continued PT services  PT Problem List Decreased strength;Decreased activity tolerance;Decreased balance;Decreased coordination;Decreased mobility;Decreased cognition;Decreased knowledge of use of DME;Decreased safety awareness;Decreased knowledge of precautions;Cardiopulmonary status limiting activity;Obesity       PT Treatment Interventions DME instruction;Gait training;Stair training;Functional mobility training;Therapeutic activities;Therapeutic exercise;Balance training;Cognitive remediation;Patient/family education    PT Goals (Current goals can be found in the Care Plan section)  Acute Rehab PT Goals Patient Stated Goal: to return home today after dialysis PT Goal Formulation: With patient Time For Goal Achievement: 01/17/18 Potential to Achieve Goals: Fair    Frequency Min 2X/week   Barriers to discharge        Co-evaluation               AM-PAC PT "6 Clicks" Mobility  Outcome Measure Help needed turning from your back to your side while in a flat bed without using bedrails?: A Little Help needed moving from lying on your back to sitting on the side of a flat bed without using bedrails?: A Little Help needed moving to and from a bed to a chair (including a wheelchair)?: A Little Help needed standing up from a chair using your arms (e.g., wheelchair or bedside chair)?: A Little Help needed to walk in hospital room?: A Little Help needed climbing 3-5 steps with a railing? : A Lot 6 Click Score: 17    End of Session Equipment Utilized During Treatment: Gait  belt Activity Tolerance: Patient limited by fatigue Patient left: in bed;with bed alarm set;with call bell/phone within reach Nurse Communication: Mobility status PT Visit Diagnosis: Unsteadiness on feet (R26.81);Difficulty in walking, not elsewhere classified (R26.2);Muscle weakness (generalized) (M62.81)    Time: 1111-1140 PT Time Calculation (min) (ACUTE ONLY): 29 min   Charges:   PT Evaluation $PT Eval Moderate Complexity: 1 Mod PT Treatments $Therapeutic Activity: 8-22 mins       Appolonia Ackert H. Owens Shark, PT, DPT, NCS 01/03/18, 12:18 AM [phone]

## 2018-01-10 DIAGNOSIS — N186 End stage renal disease: Secondary | ICD-10-CM | POA: Diagnosis not present

## 2018-01-10 DIAGNOSIS — Z992 Dependence on renal dialysis: Secondary | ICD-10-CM | POA: Diagnosis not present

## 2018-01-14 DIAGNOSIS — N186 End stage renal disease: Secondary | ICD-10-CM | POA: Diagnosis not present

## 2018-01-14 DIAGNOSIS — D631 Anemia in chronic kidney disease: Secondary | ICD-10-CM | POA: Diagnosis not present

## 2018-01-14 DIAGNOSIS — Z992 Dependence on renal dialysis: Secondary | ICD-10-CM | POA: Diagnosis not present

## 2018-01-14 DIAGNOSIS — D509 Iron deficiency anemia, unspecified: Secondary | ICD-10-CM | POA: Diagnosis not present

## 2018-01-14 LAB — SULFONYLUREA HYPOGLYCEMICS PANEL, SERUM
Acetohexamide: NEGATIVE ug/mL (ref 20–60)
Chlorpropamide: NEGATIVE ug/mL (ref 75–250)
GLYBURIDE: NEGATIVE ng/mL
Glimepiride: NEGATIVE ng/mL (ref 80–250)
Glipizide: 10 ng/mL — ABNORMAL LOW (ref 200–1000)
Nateglinide: NEGATIVE ng/mL
Repaglinide: NEGATIVE ng/mL
Tolazamide: NEGATIVE ug/mL
Tolbutamide: NEGATIVE ug/mL (ref 40–100)

## 2018-01-21 DIAGNOSIS — N186 End stage renal disease: Secondary | ICD-10-CM | POA: Diagnosis not present

## 2018-01-21 DIAGNOSIS — Z992 Dependence on renal dialysis: Secondary | ICD-10-CM | POA: Diagnosis not present

## 2018-01-21 DIAGNOSIS — D509 Iron deficiency anemia, unspecified: Secondary | ICD-10-CM | POA: Diagnosis not present

## 2018-01-21 DIAGNOSIS — D631 Anemia in chronic kidney disease: Secondary | ICD-10-CM | POA: Diagnosis not present

## 2018-01-26 DIAGNOSIS — D631 Anemia in chronic kidney disease: Secondary | ICD-10-CM | POA: Diagnosis not present

## 2018-01-26 DIAGNOSIS — D509 Iron deficiency anemia, unspecified: Secondary | ICD-10-CM | POA: Diagnosis not present

## 2018-01-26 DIAGNOSIS — Z992 Dependence on renal dialysis: Secondary | ICD-10-CM | POA: Diagnosis not present

## 2018-01-26 DIAGNOSIS — E119 Type 2 diabetes mellitus without complications: Secondary | ICD-10-CM | POA: Diagnosis not present

## 2018-01-26 DIAGNOSIS — N186 End stage renal disease: Secondary | ICD-10-CM | POA: Diagnosis not present

## 2018-01-31 DIAGNOSIS — D509 Iron deficiency anemia, unspecified: Secondary | ICD-10-CM | POA: Diagnosis not present

## 2018-01-31 DIAGNOSIS — D631 Anemia in chronic kidney disease: Secondary | ICD-10-CM | POA: Diagnosis not present

## 2018-01-31 DIAGNOSIS — N186 End stage renal disease: Secondary | ICD-10-CM | POA: Diagnosis not present

## 2018-01-31 DIAGNOSIS — Z992 Dependence on renal dialysis: Secondary | ICD-10-CM | POA: Diagnosis not present

## 2018-02-04 DIAGNOSIS — D631 Anemia in chronic kidney disease: Secondary | ICD-10-CM | POA: Diagnosis not present

## 2018-02-04 DIAGNOSIS — Z992 Dependence on renal dialysis: Secondary | ICD-10-CM | POA: Diagnosis not present

## 2018-02-04 DIAGNOSIS — D509 Iron deficiency anemia, unspecified: Secondary | ICD-10-CM | POA: Diagnosis not present

## 2018-02-04 DIAGNOSIS — N186 End stage renal disease: Secondary | ICD-10-CM | POA: Diagnosis not present

## 2018-02-07 NOTE — Discharge Summary (Signed)
AMA notes  Date of admission 12/21/2017 Patient left hospital [place] on 12/22/2017   TammyDunnis a75 y.o.femalewith a known history of end-stage renal disease both on peritoneal dialysis and hemodialysis, diabetes mellitus, CHF, GERD, COPD, peripheral vascular disease and other medical problems has been lethargic for the past several days and became altered today. Husband called EMS and her Accu-Chek was at 42 patient was given 200 cc of D10 and CBG went up to 160 patient is brought into the emergency department as her sugar was persistently low she was started on D10 and hospitalist team is called to admit the patient.    Encephalopathy secondary to hypoglycemia Resolved On dextrose drip titrate as needed monitor frequent Accu-Cheks Patient has glucometer but ran out of the supplies and not checking her Accu-Cheks for the past several days. We will put a diabetic coordinator consult Neurochecks Consult diabetic coordinator   Hypothermia secondary to hypoglycemia Resolved   End-stage renal disease on peritoneal dialysis and hemodialysis Dr.kolluru nephrology is following Had hemodialysis yesterday and repeat hemodialysis today Plan is to resume Tuesday Thursday Saturday schedule  Diabetes mellitus Currently patient is still hypoglycemic and on dextrose drip Consult placed to diabetic coordinator will try to wean off the dextrose drip   Leukocytosis probably reactive we will continue close monitoring  COPD no exacerbation at this time Nebulizer treatments as needed for shortness of breath   According to RNs note patient left hospital [place] on 12/22/2017 at 8 PM after hemodialysis

## 2018-02-10 DIAGNOSIS — N186 End stage renal disease: Secondary | ICD-10-CM | POA: Diagnosis not present

## 2018-02-10 DIAGNOSIS — Z992 Dependence on renal dialysis: Secondary | ICD-10-CM | POA: Diagnosis not present

## 2018-02-11 DIAGNOSIS — N186 End stage renal disease: Secondary | ICD-10-CM | POA: Diagnosis not present

## 2018-02-11 DIAGNOSIS — Z992 Dependence on renal dialysis: Secondary | ICD-10-CM | POA: Diagnosis not present

## 2018-02-11 DIAGNOSIS — D631 Anemia in chronic kidney disease: Secondary | ICD-10-CM | POA: Diagnosis not present

## 2018-02-11 DIAGNOSIS — N2581 Secondary hyperparathyroidism of renal origin: Secondary | ICD-10-CM | POA: Diagnosis not present

## 2018-02-11 DIAGNOSIS — D509 Iron deficiency anemia, unspecified: Secondary | ICD-10-CM | POA: Diagnosis not present

## 2018-02-17 ENCOUNTER — Encounter: Payer: Self-pay | Admitting: Emergency Medicine

## 2018-02-17 ENCOUNTER — Emergency Department: Payer: Medicare Other

## 2018-02-17 ENCOUNTER — Inpatient Hospital Stay
Admission: EM | Admit: 2018-02-17 | Discharge: 2018-02-19 | DRG: 640 | Disposition: A | Discharge status: Home / Self Care | Payer: Medicare Other | Attending: Internal Medicine | Admitting: Internal Medicine

## 2018-02-17 ENCOUNTER — Other Ambulatory Visit: Payer: Self-pay

## 2018-02-17 DIAGNOSIS — K219 Gastro-esophageal reflux disease without esophagitis: Secondary | ICD-10-CM | POA: Diagnosis present

## 2018-02-17 DIAGNOSIS — J9601 Acute respiratory failure with hypoxia: Secondary | ICD-10-CM | POA: Diagnosis not present

## 2018-02-17 DIAGNOSIS — E872 Acidosis: Secondary | ICD-10-CM | POA: Diagnosis present

## 2018-02-17 DIAGNOSIS — E1151 Type 2 diabetes mellitus with diabetic peripheral angiopathy without gangrene: Secondary | ICD-10-CM | POA: Diagnosis present

## 2018-02-17 DIAGNOSIS — R0602 Shortness of breath: Secondary | ICD-10-CM | POA: Diagnosis not present

## 2018-02-17 DIAGNOSIS — Z833 Family history of diabetes mellitus: Secondary | ICD-10-CM

## 2018-02-17 DIAGNOSIS — E875 Hyperkalemia: Principal | ICD-10-CM | POA: Diagnosis present

## 2018-02-17 DIAGNOSIS — Z87891 Personal history of nicotine dependence: Secondary | ICD-10-CM

## 2018-02-17 DIAGNOSIS — N186 End stage renal disease: Secondary | ICD-10-CM | POA: Diagnosis not present

## 2018-02-17 DIAGNOSIS — J810 Acute pulmonary edema: Secondary | ICD-10-CM

## 2018-02-17 DIAGNOSIS — R404 Transient alteration of awareness: Secondary | ICD-10-CM | POA: Diagnosis not present

## 2018-02-17 DIAGNOSIS — Z992 Dependence on renal dialysis: Secondary | ICD-10-CM

## 2018-02-17 DIAGNOSIS — I272 Pulmonary hypertension, unspecified: Secondary | ICD-10-CM | POA: Diagnosis present

## 2018-02-17 DIAGNOSIS — Z79899 Other long term (current) drug therapy: Secondary | ICD-10-CM | POA: Diagnosis not present

## 2018-02-17 DIAGNOSIS — I5032 Chronic diastolic (congestive) heart failure: Secondary | ICD-10-CM | POA: Diagnosis present

## 2018-02-17 DIAGNOSIS — Z8249 Family history of ischemic heart disease and other diseases of the circulatory system: Secondary | ICD-10-CM | POA: Diagnosis not present

## 2018-02-17 DIAGNOSIS — I132 Hypertensive heart and chronic kidney disease with heart failure and with stage 5 chronic kidney disease, or end stage renal disease: Secondary | ICD-10-CM | POA: Diagnosis present

## 2018-02-17 DIAGNOSIS — I10 Essential (primary) hypertension: Secondary | ICD-10-CM | POA: Diagnosis not present

## 2018-02-17 DIAGNOSIS — N2581 Secondary hyperparathyroidism of renal origin: Secondary | ICD-10-CM | POA: Diagnosis present

## 2018-02-17 DIAGNOSIS — Z888 Allergy status to other drugs, medicaments and biological substances status: Secondary | ICD-10-CM | POA: Diagnosis not present

## 2018-02-17 DIAGNOSIS — E669 Obesity, unspecified: Secondary | ICD-10-CM | POA: Diagnosis present

## 2018-02-17 DIAGNOSIS — R0902 Hypoxemia: Secondary | ICD-10-CM | POA: Diagnosis not present

## 2018-02-17 DIAGNOSIS — D631 Anemia in chronic kidney disease: Secondary | ICD-10-CM | POA: Diagnosis not present

## 2018-02-17 DIAGNOSIS — R4182 Altered mental status, unspecified: Secondary | ICD-10-CM

## 2018-02-17 DIAGNOSIS — J449 Chronic obstructive pulmonary disease, unspecified: Secondary | ICD-10-CM | POA: Diagnosis present

## 2018-02-17 DIAGNOSIS — E1122 Type 2 diabetes mellitus with diabetic chronic kidney disease: Secondary | ICD-10-CM | POA: Diagnosis present

## 2018-02-17 DIAGNOSIS — Z9115 Patient's noncompliance with renal dialysis: Secondary | ICD-10-CM | POA: Diagnosis not present

## 2018-02-17 DIAGNOSIS — Z6830 Body mass index (BMI) 30.0-30.9, adult: Secondary | ICD-10-CM

## 2018-02-17 DIAGNOSIS — Z7982 Long term (current) use of aspirin: Secondary | ICD-10-CM

## 2018-02-17 DIAGNOSIS — G9341 Metabolic encephalopathy: Secondary | ICD-10-CM | POA: Diagnosis present

## 2018-02-17 HISTORY — DX: End stage renal disease: N18.6

## 2018-02-17 LAB — BASIC METABOLIC PANEL
Anion gap: 16 — ABNORMAL HIGH (ref 5–15)
Anion gap: 12 (ref 5–15)
BUN: 57 mg/dL — AB (ref 6–20)
BUN: 31 mg/dL — ABNORMAL HIGH (ref 6–20)
CO2: 21 mmol/L — ABNORMAL LOW (ref 22–32)
CO2: 27 mmol/L (ref 22–32)
Calcium: 7.9 mg/dL — ABNORMAL LOW (ref 8.9–10.3)
Calcium: 8 mg/dL — ABNORMAL LOW (ref 8.9–10.3)
Chloride: 99 mmol/L (ref 98–111)
Chloride: 98 mmol/L (ref 98–111)
Creatinine, Ser: 10.23 mg/dL — ABNORMAL HIGH (ref 0.44–1.00)
Creatinine, Ser: 6.61 mg/dL — ABNORMAL HIGH (ref 0.44–1.00)
GFR calc Af Amer: 4 mL/min — ABNORMAL LOW (ref >60)
GFR calc Af Amer: 7 mL/min — ABNORMAL LOW (ref >60)
GFR calc non Af Amer: 4 mL/min — ABNORMAL LOW (ref >60)
GFR calc non Af Amer: 6 mL/min — ABNORMAL LOW (ref >60)
GLUCOSE: 63 mg/dL — AB (ref 70–99)
Glucose, Bld: 108 mg/dL — ABNORMAL HIGH (ref 70–99)
POTASSIUM: 6 mmol/L — AB (ref 3.5–5.1)
Potassium: 4.7 mmol/L (ref 3.5–5.1)
Sodium: 136 mmol/L (ref 135–145)
Sodium: 137 mmol/L (ref 135–145)

## 2018-02-17 LAB — CBC WITH DIFFERENTIAL/PLATELET
Abs Immature Granulocytes: 0.1 10*3/uL — ABNORMAL HIGH (ref 0.00–0.07)
Basophils Absolute: 0 10*3/uL (ref 0.0–0.1)
Basophils Relative: 0 %
EOS ABS: 0 10*3/uL (ref 0.0–0.5)
Eosinophils Relative: 0 %
HEMATOCRIT: 28.7 % — AB (ref 36.0–46.0)
Hemoglobin: 9 g/dL — ABNORMAL LOW (ref 12.0–15.0)
Immature Granulocytes: 1 %
Lymphocytes Relative: 4 %
Lymphs Abs: 0.5 10*3/uL — ABNORMAL LOW (ref 0.7–4.0)
MCH: 28.5 pg (ref 26.0–34.0)
MCHC: 31.4 g/dL (ref 30.0–36.0)
MCV: 90.8 fL (ref 80.0–100.0)
Monocytes Absolute: 0.6 10*3/uL (ref 0.1–1.0)
Monocytes Relative: 5 %
Neutro Abs: 10.9 10*3/uL — ABNORMAL HIGH (ref 1.7–7.7)
Neutrophils Relative %: 90 %
Platelets: 311 10*3/uL (ref 150–400)
RBC: 3.16 MIL/uL — ABNORMAL LOW (ref 3.87–5.11)
RDW: 15.9 % — AB (ref 11.5–15.5)
WBC: 12.1 10*3/uL — ABNORMAL HIGH (ref 4.0–10.5)
nRBC: 0 % (ref 0.0–0.2)

## 2018-02-17 LAB — COMPREHENSIVE METABOLIC PANEL
ALT: 21 U/L (ref 0–44)
AST: 31 U/L (ref 15–41)
Albumin: 3.3 g/dL — ABNORMAL LOW (ref 3.5–5.0)
Alkaline Phosphatase: 60 U/L (ref 38–126)
Anion gap: 17 — ABNORMAL HIGH (ref 5–15)
BUN: 63 mg/dL — ABNORMAL HIGH (ref 6–20)
CO2: 18 mmol/L — ABNORMAL LOW (ref 22–32)
Calcium: 9.6 mg/dL (ref 8.9–10.3)
Chloride: 97 mmol/L — ABNORMAL LOW (ref 98–111)
Creatinine, Ser: 10.68 mg/dL — ABNORMAL HIGH (ref 0.44–1.00)
GFR calc Af Amer: 4 mL/min — ABNORMAL LOW (ref >60)
GFR calc non Af Amer: 3 mL/min — ABNORMAL LOW (ref >60)
Glucose, Bld: 145 mg/dL — ABNORMAL HIGH (ref 70–99)
Potassium: 7.5 mmol/L (ref 3.5–5.1)
SODIUM: 132 mmol/L — AB (ref 135–145)
Total Bilirubin: 1 mg/dL (ref 0.3–1.2)
Total Protein: 6.6 g/dL (ref 6.5–8.1)

## 2018-02-17 LAB — LACTIC ACID, PLASMA
Lactic Acid, Venous: 2.8 mmol/L (ref 0.5–1.9)
Lactic Acid, Venous: 1.7 mmol/L (ref 0.5–1.9)

## 2018-02-17 LAB — PHOSPHORUS: Phosphorus: 10 mg/dL — ABNORMAL HIGH (ref 2.5–4.6)

## 2018-02-17 LAB — BRAIN NATRIURETIC PEPTIDE: B Natriuretic Peptide: 1750 pg/mL — ABNORMAL HIGH (ref 0.0–100.0)

## 2018-02-17 LAB — TROPONIN I: Troponin I: 0.03 ng/mL (ref <0.03)

## 2018-02-17 MED ORDER — SODIUM CHLORIDE 0.9 % IV SOLN: 100.0000 mL | INTRAVENOUS | Status: DC | PRN

## 2018-02-17 MED ORDER — DEXTROSE 50 % IV SOLN
50.0000 mL | Freq: Once | INTRAVENOUS | Status: AC
  Administered 2018-02-17: 50 mL via INTRAVENOUS
  Filled 2018-02-17: qty 50

## 2018-02-17 MED ORDER — ALTEPLASE 2 MG IJ SOLR
2.0000 mg | Freq: Once | INTRAMUSCULAR | Status: DC | PRN
  Filled 2018-02-17: qty 2

## 2018-02-17 MED ORDER — LIDOCAINE HCL (PF) 1 % IJ SOLN
5.0000 mL | INTRAMUSCULAR | Status: DC | PRN
  Filled 2018-02-17: qty 5

## 2018-02-17 MED ORDER — CALCIUM GLUCONATE 10 % IV SOLN
3.0000 g | Freq: Once | INTRAVENOUS | Status: AC
  Administered 2018-02-17: 3 g via INTRAVENOUS

## 2018-02-17 MED ORDER — NYSTATIN 100000 UNIT/GM EX POWD
Freq: Three times a day (TID) | CUTANEOUS | Status: DC
  Administered 2018-02-18 – 2018-02-19 (×4): via TOPICAL
  Filled 2018-02-17: qty 15

## 2018-02-17 MED ORDER — HEPARIN SODIUM (PORCINE) 1000 UNIT/ML DIALYSIS
1000.0000 [IU] | INTRAMUSCULAR | Status: DC | PRN
  Filled 2018-02-17: qty 1

## 2018-02-17 MED ORDER — CHLORHEXIDINE GLUCONATE CLOTH 2 % EX PADS
6.0000 | MEDICATED_PAD | Freq: Every day | CUTANEOUS | Status: DC
  Filled 2018-02-17: qty 6

## 2018-02-17 MED ORDER — CALCIUM GLUCONATE 10 % IV SOLN
INTRAVENOUS | Status: AC
  Filled 2018-02-17: qty 30

## 2018-02-17 MED ORDER — FUROSEMIDE 10 MG/ML IJ SOLN
100.0000 mg | Freq: Once | INTRAVENOUS | Status: DC
  Filled 2018-02-17: qty 10

## 2018-02-17 MED ORDER — PENTAFLUOROPROP-TETRAFLUOROETH EX AERO
1.0000 "application " | INHALATION_SPRAY | CUTANEOUS | Status: DC | PRN
  Filled 2018-02-17: qty 30

## 2018-02-17 MED ORDER — LIDOCAINE-PRILOCAINE 2.5-2.5 % EX CREA
1.0000 "application " | TOPICAL_CREAM | CUTANEOUS | Status: DC | PRN
  Filled 2018-02-17: qty 5

## 2018-02-17 MED ORDER — ALBUTEROL SULFATE (2.5 MG/3ML) 0.083% IN NEBU
INHALATION_SOLUTION | RESPIRATORY_TRACT | Status: AC
  Filled 2018-02-17: qty 12

## 2018-02-17 MED ORDER — INSULIN ASPART 100 UNIT/ML ~~LOC~~ SOLN
10.0000 [IU] | Freq: Once | SUBCUTANEOUS | Status: AC
  Administered 2018-02-17: 10 [IU] via INTRAVENOUS
  Filled 2018-02-17: qty 1

## 2018-02-17 MED ORDER — SODIUM BICARBONATE 8.4 % IV SOLN
50.0000 meq | Freq: Once | INTRAVENOUS | Status: AC
  Administered 2018-02-17: 50 meq via INTRAVENOUS

## 2018-02-17 MED ORDER — ALBUTEROL SULFATE (2.5 MG/3ML) 0.083% IN NEBU
10.0000 mg | INHALATION_SOLUTION | Freq: Once | RESPIRATORY_TRACT | Status: AC
  Administered 2018-02-17: 10 mg via RESPIRATORY_TRACT

## 2018-02-17 NOTE — ED Notes (Signed)
[place] with one set blood cultures per dr Clearnce Hasten

## 2018-02-17 NOTE — ED Notes (Signed)
EKG complex has narrowed and is now regular since medications.

## 2018-02-17 NOTE — Progress Notes (Signed)
[facility]  ROUNDING NOTE   Subjective:  Patient has not come to dialysis x 1 week. Presents in a critically ill state. K 7.5 and pt with EKG findings consistent with this. Also on nonrebreather.  Seen urgently during dialysis. BFR 300.    Objective:  Vital signs in last 24 hours:  Temp:  [94.8 F (34.9 C)] 94.8 F (34.9 C) (02/07 1705) Pulse Rate:  [50-122] 77 (02/07 1745) Resp:  [16-23] 23 (02/07 1745) BP: (99-153)/(53-103) 147/55 (02/07 1745) SpO2:  [80 %-100 %] 100 % (02/07 1745) Weight:  [95 kg-106.6 kg] 106.6 kg (02/07 1712)  Weight change:  Filed Weights   02/17/18 1534 02/17/18 1705 02/17/18 1712  Weight: 95 kg 106.6 kg 106.6 kg    Intake/Output: No intake/output data recorded.   Intake/Output this shift:  No intake/output data recorded.  Physical Exam: General: Critically ill appearing  Head: Normocephalic, atraumatic. Moist oral mucosal membranes  Eyes: Anicteric  Neck: Supple, trachea midline  Lungs:  Bilateral rales, normal effort  Heart: S1S2 no rubs  Abdomen:  Soft, nontender, bowel sounds present  Extremities: 3+ peripheral edema.  Neurologic: Lethargic but arousable  Skin: No lesions  Access: AVF    Basic Metabolic Panel: Recent Labs  Lab 02/17/18 1553  NA 132*  K 7.5*  CL 97*  CO2 18*  GLUCOSE 145*  BUN 63*  CREATININE 10.68*  CALCIUM 9.6    Liver Function Tests: Recent Labs  Lab 02/17/18 1553  AST 31  ALT 21  ALKPHOS 60  BILITOT 1.0  PROT 6.6  ALBUMIN 3.3*   No results for input(s): LIPASE, AMYLASE in the last 168 hours. No results for input(s): AMMONIA in the last 168 hours.  CBC: Recent Labs  Lab 02/17/18 1528  WBC 12.1*  NEUTROABS 10.9*  HGB 9.0*  HCT 28.7*  MCV 90.8  PLT 311    Cardiac Enzymes: Recent Labs  Lab 02/17/18 1553  TROPONINI 0.03*    BNP: Invalid input(s): POCBNP  CBG: No results for input(s): GLUCAP in the last 168 hours.  Microbiology: Results for orders placed or  performed during the hospital encounter of 12/21/17  CULTURE, BLOOD (ROUTINE X 2) w Reflex to ID Panel     Status: None   Collection Time: 12/21/17  1:21 PM  Result Value Ref Range Status   Specimen Description BLOOD RIGHT ANTECUBITAL  Final   Special Requests   Final    BOTTLES DRAWN AEROBIC AND ANAEROBIC Blood Culture adequate volume   Culture   Final    NO GROWTH 5 DAYS Performed at [facility], [address] [place], [place] [address]    Report Status 12/26/2017 FINAL  Final  CULTURE, BLOOD (ROUTINE X 2) w Reflex to ID Panel     Status: None   Collection Time: 12/21/17  1:39 PM  Result Value Ref Range Status   Specimen Description BLOOD RIGHT HAND  Final   Special Requests   Final    BOTTLES DRAWN AEROBIC AND ANAEROBIC Blood Culture adequate volume   Culture   Final    NO GROWTH 5 DAYS Performed at [facility], [address], [place], [place] [address]    Report Status 12/26/2017 FINAL  Final  MRSA PCR Screening     Status: None   Collection Time: 12/21/17  6:34 PM  Result Value Ref Range Status   MRSA by PCR NEGATIVE NEGATIVE Final    Comment:        The GeneXpert MRSA  Assay (FDA approved for NASAL specimens only), is one component of a comprehensive MRSA colonization surveillance program. It is not intended to diagnose MRSA infection nor to guide or monitor treatment for MRSA infections. Performed at [facility], [address] [place], [place] [address]     Coagulation Studies: No results for input(s): LABPROT, INR in the last 72 hours.  Urinalysis: No results for input(s): COLORURINE, LABSPEC, PHURINE, GLUCOSEU, HGBUR, BILIRUBINUR, KETONESUR, PROTEINUR, UROBILINOGEN, NITRITE, LEUKOCYTESUR in the last 72 hours.  Invalid input(s): APPERANCEUR    Imaging: Dg Chest 1 View  Result Date: 02/17/2018 CLINICAL DATA:  Missed dialysis for 1 week.  Short of breath. EXAM: CHEST  1 VIEW COMPARISON:  01/02/2018 . FINDINGS: the heart  size is enlarged. There is aortic atherosclerosis. Moderate right pleural effusion and smaller left pleural effusion identified. Moderate diffuse pulmonary edema identified. Nodular density projecting over the left upper lobe is indeterminate. Not confidently identified on previous imaging. IMPRESSION: 1. Bilateral pleural effusions and moderate pulmonary edema compatible with fluid overload state consistent with missing dialysis. 2. Indeterminate nodular density in the left upper lobe. Not confidently identified on previous imaging. Recommend follow-up imaging after dialysis and resolution of pulmonary symptoms with upright PA and lateral chest radiograph to reassess. If persistent and suspicious a CT of the chest may be obtained. Electronically Signed   By: Kerby Moors M.D.   On: 02/17/2018 16:13     Medications:   . sodium chloride    . sodium chloride    . furosemide     . [START ON 02/18/2018] Chlorhexidine Gluconate Cloth  6 each Topical Q0600  . nystatin   Topical TID   sodium chloride, sodium chloride, alteplase, heparin, lidocaine (PF), lidocaine-prilocaine, pentafluoroprop-tetrafluoroeth  Assessment/ Plan:  58 y.o. female with end-stage renal disease, history of noncompliance, currently hemodialysis, diabetes, GERD, COPD, peripheral vascular disease   [facility] [place]/TTS/left arm AV fistula  1.  ESRD on HD TTS.  Patient with documented dialysis nonadherence of both peritoneal dialysis and hemodialysis.  She has not dialyzed for 1 week.  Now presents critically ill.  Patient brought down to the dialysis suite urgently.  Blood flow rate 300 with UF target of 2 kg.  Reevaluate for dialysis tomorrow as well.  2.  Severe hyperkalemia.  Potassium 7.5.  Continue hemodialysis on a 1K bath.  3.  Anemia of chronic kidney disease.  Continue to monitor CBC.  4.  Secondary hyperparathyroidism.  Continue to follow bone mineral metabolism parameters over the course of the hospitalization.    LOS: 0 Justyce Yeater 2/7/20205:51 PM

## 2018-02-17 NOTE — ED Notes (Signed)
Warm blankets applied

## 2018-02-17 NOTE — Progress Notes (Signed)
Notified about patient, K greater than 7, will plan for urgent dialysis, dialysis RN notified.

## 2018-02-17 NOTE — ED Notes (Signed)
Rhythm narrowing after medication

## 2018-02-17 NOTE — Progress Notes (Deleted)
HD tx start    02/17/18 1730  Vital Signs  Pulse Rate 77  Pulse Rate Source Monitor  Resp 16  BP (!) 145/53  BP Location Right Arm  BP Method Automatic  Patient Position (if appropriate) Lying  Oxygen Therapy  SpO2 100 %  O2 Device Non-rebreather Mask  During Hemodialysis Assessment  Blood Flow Rate (mL/min) 300 mL/min  Arterial Pressure (mmHg) -130 mmHg  Venous Pressure (mmHg) 150 mmHg  Transmembrane Pressure (mmHg) 70 mmHg  Ultrafiltration Rate (mL/min) 830 mL/min  Dialysate Flow Rate (mL/min) 600 ml/min  Conductivity: Machine  13.5  HD Safety Checks Performed Yes  Dialysis Fluid Bolus Normal Saline  Bolus Amount (mL) 250 mL  Intra-Hemodialysis Comments Tx initiated

## 2018-02-17 NOTE — H&P (Signed)
[facility] [place] NAME: Jacqueline Rodriguez    MR#:  [id number]  DATE OF BIRTH:  03-19-1960  DATE OF ADMISSION:  02/17/2018  PRIMARY CARE PHYSICIAN: Patient, No Pcp Per   REQUESTING/REFERRING PHYSICIAN: Dr. Larae Grooms  CHIEF COMPLAINT:   Chief Complaint  Patient presents with  . Altered Mental Status    HISTORY OF PRESENT ILLNESS:  Kerby Hockley  is a 58 y.o. female with a known history of end-stage renal disease on Tuesday, Thursday and Saturday hemodialysis, peripheral vascular disease, pulmonary hypertension, diastolic CHF, COPD not on home oxygen and diabetes not on medications due to recurrent episodes of hypoglycemia brought to hospital secondary to worsening confusion and edema.  Most of the history is obtained from patient's husband who is at bedside as patient is very confused.  According to him patient has missed 2 dialysis sessions.  Last dialysis was last Saturday.  Tuesday she did not want to go because she had some diarrhea and Thursday because of the weather she did not go for dialysis.  For the last couple of days she started acting much confused than baseline, very weak, laying in bed and also noted to have increased lower extremity edema and trouble breathing so she was brought to the emergency room.  Is noted to be hypoxic on room air and also had increased respiratory rate.  Chest x-ray showing bilateral pleural effusions and pulmonary edema.  Also potassium of 7.5 and bicarb of 18.  She is started on BiPAP and is being taken for emergent hemodialysis.   PAST MEDICAL HISTORY:   Past Medical History:  Diagnosis Date  . Anemia   . Chronic bronchitis ([place])   . Chronic diastolic CHF (congestive heart failure) ([place])   . CKD (chronic kidney disease), stage III ([place])   . COPD (chronic obstructive pulmonary disease) ([place])   . Diabetes mellitus with renal complications ([place])   . ESRD (end stage renal disease) ([place])   . Essential  hypertension   . GERD (gastroesophageal reflux disease)   . Obesity   . Peripheral vascular disease ([place])   . Pulmonary hypertension ([place])   . Renal insufficiency 09/19/2015   Stage 3 CKD. Beginning dialysis.    PAST SURGICAL HISTORY:   Past Surgical History:  Procedure Laterality Date  . A/V FISTULAGRAM N/A 09/16/2016   Procedure: A/V Fistulagram;  Surgeon: Algernon Huxley, MD;  Location: [facility];  Service: Cardiovascular;  Laterality: N/A;  . AV FISTULA PLACEMENT Left 10/01/2015   Procedure: ARTERIOVENOUS (AV) FISTULA CREATION ( BRACHIAL CEPHALIC );  Surgeon: Algernon Huxley, MD;  Location: [facility];  Service: Vascular;  Laterality: Left;  . CAPD INSERTION N/A 06/10/2016   Procedure: LAPAROSCOPIC INSERTION CONTINUOUS AMBULATORY PERITONEAL DIALYSIS  (CAPD) CATHETER;  Surgeon: Algernon Huxley, MD;  Location: [facility];  Service: Vascular;  Laterality: N/A;  . DIALYSIS/PERMA CATHETER INSERTION  09/16/2016   Procedure: DIALYSIS/PERMA CATHETER INSERTION;  Surgeon: Algernon Huxley, MD;  Location: [facility];  Service: Cardiovascular;;  . DIALYSIS/PERMA CATHETER REMOVAL N/A 12/06/2016   Procedure: DIALYSIS/PERMA CATHETER REMOVAL;  Surgeon: Algernon Huxley, MD;  Location: [facility];  Service: Cardiovascular;  Laterality: N/A;  . PERIPHERAL VASCULAR CATHETERIZATION N/A 07/21/2015   Procedure: Dialysis/Perma Catheter;  Surgeon: Algernon Huxley, MD;  Location: [facility];  Service: Cardiovascular;  Laterality: N/A;  . PERIPHERAL VASCULAR CATHETERIZATION N/A 12/11/2015   Procedure: Dialysis/Perma Catheter Removal;  Surgeon:  Algernon Huxley, MD;  Location: [facility];  Service: Cardiovascular;  Laterality: N/A;    SOCIAL HISTORY:   Social History   Tobacco Use  . Smoking status: Former Smoker    Packs/day: 0.50    Years: 5.00    Pack years: 2.50    Types: Cigarettes    Last attempt to quit: 03/24/1985    Years since quitting: 32.9  . Smokeless tobacco: Never Used    Substance Use Topics  . Alcohol use: No    Alcohol/week: 0.0 standard drinks    FAMILY HISTORY:   Family History  Problem Relation Age of Onset  . Hypertension Mother   . Diabetes Mellitus II Mother   . CAD Father   . Hypertension Father   . Heart attack Father     DRUG ALLERGIES:   Allergies  Allergen Reactions  . Ambien [Zolpidem] Other (See Comments)    hallucinations    REVIEW OF SYSTEMS:   Review of Systems  Unable to perform ROS: Mental status change    MEDICATIONS AT HOME:   Prior to Admission medications   Medication Sig Start Date End Date Taking? Authorizing Provider  albuterol (PROVENTIL HFA;VENTOLIN HFA) 108 (90 Base) MCG/ACT inhaler Inhale 2 puffs into the lungs every 6 (six) hours as needed for wheezing or shortness of breath. 01/08/15  Yes Gregor Hams, MD  amLODipine (NORVASC) 5 MG tablet Take 5 mg by mouth daily. 06/22/17  Yes [provider]  aspirin 81 MG EC tablet Take 2 tablets (162 mg total) by mouth at bedtime. 12/31/17  Yes Fritzi Mandes, MD  cloNIDine (CATAPRES) 0.2 MG tablet Take 0.2 mg by mouth 2 (two) times daily. 07/02/17  Yes [provider]  furosemide (LASIX) 40 MG tablet Take 40 mg by mouth daily. 12/04/17  Yes [provider]  gabapentin (NEURONTIN) 100 MG capsule Take 100 mg by mouth 3 (three) times daily as needed. For neuropathy. (patient usually only takes one capsule a day) 03/05/15  Yes [provider]  hydrALAZINE (APRESOLINE) 25 MG tablet Take 25 mg by mouth 3 (three) times daily.   Yes [provider]  lidocaine-prilocaine (EMLA) cream Apply topically 3 (three) times a week. Apply 45 minutes prior to dialysis treatment three times a week 10/22/17  Yes [provider]  acetaminophen (TYLENOL) 500 MG tablet Take 500 mg by mouth every 6 (six) hours as needed.    [provider]  cetirizine (ZYRTEC) 10 MG tablet Take 10 mg by mouth daily as needed for allergies.     [provider]  docusate sodium (COLACE) 100 MG capsule Take 100 mg by mouth 2 (two) times daily.    [provider]  GLUCOSAMINE-CHONDROITIN PO Take 4,000 mg by mouth daily.    [provider]  isosorbide mononitrate (IMDUR) 30 MG 24 hr tablet Take 1 tablet by mouth daily.    [provider]  lactulose (CHRONULAC) 10 GM/15ML solution Take 30 mLs by mouth every other day.  08/15/17   [provider]  lisinopril (PRINIVIL,ZESTRIL) 10 MG tablet Take 1 tablet by mouth daily. 08/28/17   [provider]  meclizine (ANTIVERT) 25 MG tablet Take 1 tablet (25 mg total) by mouth 3 (three) times daily as needed for dizziness. Patient taking differently: Take 25 mg by mouth daily.  12/08/15   Nance Pear, MD  ondansetron (ZOFRAN) 4 MG tablet Take 1 tablet (4 mg total) by mouth every 6 (six) hours as needed for  nausea. Patient not taking: Reported on 01/02/2018 09/17/16   Loletha Grayer, MD  sevelamer carbonate (RENVELA) 800 MG tablet Take 1,600 mg by mouth 3 (three) times daily with meals.    [provider]      VITAL SIGNS:  Blood pressure 101/60, pulse (!) 122, temperature (!) 94.8 F (34.9 C), temperature source Rectal, resp. rate 16, weight 95 kg, last menstrual period 09/18/2013, SpO2 94 %.  PHYSICAL EXAMINATION:   Physical Exam  GENERAL:  58 y.o.-year-old patient lying in the bed, very restless and confused.  Does not appear to be in distress. EYES: Pupils equal, round, reactive to light and accommodation. No scleral icterus. Extraocular muscles intact.  HEENT: Head atraumatic, normocephalic. Oropharynx and nasopharynx clear.  NECK:  Supple, no jugular venous distention. No thyroid enlargement, no tenderness.  LUNGS: Moving air bilaterally, no wheezing, rhonchi or crepitation. No use of accessory muscles of respiration.  Bibasilar crackles.  Significantly diminished breath sounds CARDIOVASCULAR: S1, S2 normal. No  rubs, or  gallops.  3/6 systolic murmur is present ABDOMEN: Soft, nontender, nondistended. Bowel sounds present. No organomegaly or mass.  EXTREMITIES: No  cyanosis, or clubbing.  2-3+ significant lower extremity edema. Patient has a left brachial AV fistula with a good thrill present NEUROLOGIC: Cranial nerves II through XII are intact. Muscle strength 5/5 in all extremities.  Following simple commands and moving all extremities.  Sensation intact. Gait not checked.  PSYCHIATRIC: The patient is alert and following commands.  Appears confused and oriented x1-2.  SKIN: No obvious rash, lesion, or ulcer.  Petechiae noted on the lower extremities. Significant fungal infection with erythema and moistness noted in the skin folds especially in the groin.  LABORATORY PANEL:   CBC Recent Labs  Lab 02/17/18 1528  WBC 12.1*  HGB 9.0*  HCT 28.7*  PLT 311   ------------------------------------------------------------------------------------------------------------------  Chemistries  Recent Labs  Lab 02/17/18 1553  NA 132*  K 7.5*  CL 97*  CO2 18*  GLUCOSE 145*  BUN 63*  CREATININE 10.68*  CALCIUM 9.6  AST 31  ALT 21  ALKPHOS 60  BILITOT 1.0   ------------------------------------------------------------------------------------------------------------------  Cardiac Enzymes Recent Labs  Lab 02/17/18 1553  TROPONINI 0.03*   ------------------------------------------------------------------------------------------------------------------  RADIOLOGY:  Dg Chest 1 View  Result Date: 02/17/2018 CLINICAL DATA:  Missed dialysis for 1 week.  Short of breath. EXAM: CHEST  1 VIEW COMPARISON:  01/02/2018 . FINDINGS: the heart size is enlarged. There is aortic atherosclerosis. Moderate right pleural effusion and smaller left pleural effusion identified. Moderate diffuse pulmonary edema identified. Nodular density projecting over the left upper lobe is indeterminate. Not confidently identified on  previous imaging. IMPRESSION: 1. Bilateral pleural effusions and moderate pulmonary edema compatible with fluid overload state consistent with missing dialysis. 2. Indeterminate nodular density in the left upper lobe. Not confidently identified on previous imaging. Recommend follow-up imaging after dialysis and resolution of pulmonary symptoms with upright PA and lateral chest radiograph to reassess. If persistent and suspicious a CT of the chest may be obtained. Electronically Signed   By: Kerby Moors M.D.   On: 02/17/2018 16:13    EKG:   Orders placed or performed during the hospital encounter of 02/17/18  . ED EKG  . ED EKG  . ED EKG  . ED EKG  . EKG 12-Lead  . EKG 12-Lead  . EKG 12-Lead  . EKG 12-Lead    IMPRESSION AND PLAN:   Vida Nicol  is a 58 y.o. female with a known history  of end-stage renal disease on Tuesday, Thursday and Saturday hemodialysis, peripheral vascular disease, pulmonary hypertension, diastolic CHF, COPD not on home oxygen and diabetes not on medications due to recurrent episodes of hypoglycemia brought to hospital secondary to worsening confusion and edema.  1.  Acute respiratory failure-secondary to acute pulmonary edema secondary to missing dialysis. -Admit to ICU, currently on BiPAP.  If does not recover after dialysis-family is okay for intubation. -Received IV Lasix but going for dialysis now. -Last echocardiogram showing EF of 65 to 70%.  2.  Hyperkalemia and metabolic acidosis-secondary to missing dialysis.  Given the treatment for hyperkalemia in the ED and patient will be going for urgent hemodialysis.  Monitor labs again later after dialysis -Will be admitted to ICU and will be on cardiac monitor  3.  End-stage renal disease on hemodialysis-on Tuesday, Thursday and Saturday hemodialysis schedule.  Notified nephrology for urgent hemodialysis.  Patient will be going for dialysis today  4.  Hypertension-continue home medications.  Patient on  hydralazine, Imdur, clonidine, Norvasc.  5.  Acute uremic/metabolic encephalopathy-causing significant confusion-no focal deficits noted.  Continue to monitor after dialysis  6.  DVT prophylaxis-on subcutaneous heparin  Paged ICU pager and awaiting to hear from the intensivist.  ICU charge nurse notified about the admission.   All the records are reviewed and case discussed with ED provider. Management plans discussed with the patient, family and they are in agreement.  CODE STATUS: Full code  TOTAL CRITICAL CARE TIME SPENT IN TAKING CARE OF THIS PATIENT: 61 minutes.    Gladstone Lighter M.D on 02/17/2018 at 5:02 PM  Between 7am to 6pm - Pager - [phone]  After 6pm go to [email or web address] - password [email or web address]  Office  [phone]  CC: Primary care physician; Patient, No Pcp Per

## 2018-02-17 NOTE — Progress Notes (Signed)
Pre HD assessment    02/17/18 1705  Vital Signs  Temp (!) 94.8 F (34.9 C)  Temp Source Rectal  Pulse Rate 77  Pulse Rate Source Monitor  Resp 20  BP (!) 153/72  BP Location Right Arm  BP Method Automatic  Patient Position (if appropriate) Lying  Oxygen Therapy  SpO2 100 %  O2 Device Room Air  Pain Assessment  Pain Scale CPOT  Critical Care Pain Observation Tool (CPOT)  Facial Expression 0  Body Movements 1  Muscle Tension 0  Compliance with ventilator (intubated pts.) N/A  Vocalization (extubated pts.) 1  CPOT Total 2  Dialysis Weight  Weight 106.6 kg  Type of Weight Pre-Dialysis  Time-Out for Hemodialysis  What Procedure? HD  Pt Identifiers(min of two) First/Last Name;MRN/Account#  Correct Site? Yes  Correct Side? Yes  Correct Procedure? Yes  Consents Verified? Yes  Rad Studies Available? N/A  Safety Precautions Reviewed? Yes  Engineer, civil (consulting) Number 3  Station Number 1  UF/Alarm Test Passed  Conductivity: Meter 14  Conductivity: Machine  14.1  pH 7.4  Reverse Osmosis main  Normal Saline Lot Number [id number]  Dialyzer Lot Number [id number]  Disposable Set Lot Number [id number]  Machine Temperature 98.6 F (37 C)  Musician and Audible Yes  Blood Lines Intact and Secured Yes  Pre Treatment Patient Checks  Vascular access used during treatment Fistula (Simultaneous filing. User may not have seen previous data.)  Hepatitis B Surface Antigen Results  (unk)  Hepatitis B Surface Antibody  (unk)  Date Hepatitis B Surface Antibody Drawn 02/17/18  Hemodialysis Consent Verified Yes  Hemodialysis Standing Orders Initiated Yes  ECG (Telemetry) Monitor On Yes  Prime Ordered Normal Saline  Length of  DialysisTreatment -hour(s) 3 Hour(s)  Dialyzer Elisio 17H NR  Dialysate 1K  Dialysis Anticoagulant None  Dialysate Flow Ordered 600  Blood Flow Rate Ordered 300 mL/min  Ultrafiltration Goal 2 Liters  Pre Treatment Labs Phosphorus;Hepatitis B Surface  Antigen  Dialysis Blood Pressure Support Ordered Normal Saline  Education / Care Plan  Dialysis Education Provided Yes  Documented Education in Care Plan Yes

## 2018-02-17 NOTE — ED Notes (Signed)
ED TO INPATIENT HANDOFF REPORT  Name/Age/Gender Jacqueline Rodriguez 58 y.o. female  Code Status Code Status History    Date Active Date Inactive Code Status Order ID Comments User Context   12/28/2017 2322 01/02/2018 2102 Full Code [id number]  Sedalia Muta, MD ED   12/21/2017 1215 12/23/2017 0930 Full Code [id number]  Nicholes Mango, MD Inpatient   09/07/2017 1813 09/10/2017 2239 Full Code [id number]  Henreitta Leber, MD Inpatient   12/16/2016 0814 12/17/2016 1438 Full Code [id number]  Saundra Shelling, MD Inpatient   12/03/2016 1201 12/04/2016 1850 Full Code [id number]  Max Sane, MD Inpatient   09/13/2016 1629 09/17/2016 1817 Full Code [id number]  Epifanio Lesches, MD ED   09/08/2016 1631 09/09/2016 1823 Full Code [id number]  Fritzi Mandes, MD Inpatient   07/17/2015 1244 07/24/2015 1911 Full Code [id number]  Epifanio Lesches, MD ED   03/15/2015 1956 03/20/2015 1548 Full Code [id number]  Idelle Crouch, MD Inpatient      Home/SNF/Other Home  Chief Complaint Hyperkalemia [E87.5] Acute pulmonary edema ([place]) [J81.0] Hypoxia [R09.02] Altered mental status, unspecified altered mental status type [R41.82]  Level of Care/Admitting Diagnosis ED Disposition    ED Disposition Condition [place]: [place] [[id number]]  Level of Care: Telemetry [5]  Diagnosis: Hyperkalemia [562563]  Admitting Physician: Gladstone Lighter [[id number]]  Attending Physician: Gladstone Lighter [[id number]]  Estimated length of stay: past midnight tomorrow  Certification:: I certify this patient will need inpatient services for at least 2 midnights  PT Class (Do Not Modify): Inpatient [101]  PT Acc Code (Do Not Modify): Private [1]       Medical History Past Medical History:  Diagnosis Date  . Anemia   . Chronic bronchitis ([place])   . Chronic diastolic CHF (congestive heart failure) ([place])   . CKD (chronic kidney disease), stage III ([place])   . COPD (chronic obstructive pulmonary  disease) ([place])   . Diabetes mellitus with renal complications ([place])   . ESRD (end stage renal disease) ([place])   . Essential hypertension   . GERD (gastroesophageal reflux disease)   . Obesity   . Peripheral vascular disease ([place])   . Pulmonary hypertension ([place])   . Renal insufficiency 09/19/2015   Stage 3 CKD. Beginning dialysis.    Allergies Allergies  Allergen Reactions  . Ambien [Zolpidem] Other (See Comments)    hallucinations    IV Location/Drains/Wounds Patient Lines/Drains/Airways Status   Active Line/Drains/Airways    Name:   Placement date:   Placement time:   Site:   Days:   Peripheral IV 02/17/18 Right Antecubital   02/17/18    1540    Antecubital   less than 1   Fistula / Graft Left Upper arm Arteriovenous fistula   10/01/15    1110    Upper arm   870          Labs/Imaging Results for orders placed or performed during the hospital encounter of 02/17/18 (from the past 48 hour(s))  CBC with Differential     Status: Abnormal   Collection Time: 02/17/18  3:28 PM  Result Value Ref Range   WBC 12.1 (H) 4.0 - 10.5 K/uL   RBC 3.16 (L) 3.87 - 5.11 MIL/uL   Hemoglobin 9.0 (L) 12.0 - 15.0 g/dL   HCT 28.7 (L) 36.0 - 46.0 %   MCV 90.8 80.0 - 100.0 fL   MCH 28.5 26.0 - 34.0 pg   MCHC 31.4 30.0 - 36.0 g/dL   RDW 15.9 (  H) 11.5 - 15.5 %   Platelets 311 150 - 400 K/uL   nRBC 0.0 0.0 - 0.2 %   Neutrophils Relative % 90 %   Neutro Abs 10.9 (H) 1.7 - 7.7 K/uL   Lymphocytes Relative 4 %   Lymphs Abs 0.5 (L) 0.7 - 4.0 K/uL   Monocytes Relative 5 %   Monocytes Absolute 0.6 0.1 - 1.0 K/uL   Eosinophils Relative 0 %   Eosinophils Absolute 0.0 0.0 - 0.5 K/uL   Basophils Relative 0 %   Basophils Absolute 0.0 0.0 - 0.1 K/uL   Immature Granulocytes 1 %   Abs Immature Granulocytes 0.10 (H) 0.00 - 0.07 K/uL    Comment: Performed at [facility], [address], [place], [place] [address]  Brain natriuretic peptide     Status: Abnormal   Collection Time: 02/17/18  3:28  PM  Result Value Ref Range   B Natriuretic Peptide 1,750.0 (H) 0.0 - 100.0 pg/mL    Comment: Performed at [facility], [address] [place], [place] [address]  Lactic acid, plasma     Status: Abnormal   Collection Time: 02/17/18  3:43 PM  Result Value Ref Range   Lactic Acid, Venous 2.8 (HH) 0.5 - 1.9 mmol/L    Comment: CRITICAL RESULT CALLED TO, READ BACK BY AND VERIFIED WITH VALERIE CHANDLER AT 3154 02/17/2018.PMF Performed at [facility], [address] [place], [place] [address]   Comprehensive metabolic panel     Status: Abnormal   Collection Time: 02/17/18  3:53 PM  Result Value Ref Range   Sodium 132 (L) 135 - 145 mmol/L   Potassium 7.5 (HH) 3.5 - 5.1 mmol/L    Comment: CRITICAL RESULT CALLED TO, READ BACK BY AND VERIFIED WITH VALERIE CHANDLER AT 6195 02/17/2018.PMF   Chloride 97 (L) 98 - 111 mmol/L   CO2 18 (L) 22 - 32 mmol/L   Glucose, Bld 145 (H) 70 - 99 mg/dL   BUN 63 (H) 6 - 20 mg/dL   Creatinine, Ser 10.68 (H) 0.44 - 1.00 mg/dL   Calcium 9.6 8.9 - 10.3 mg/dL   Total Protein 6.6 6.5 - 8.1 g/dL   Albumin 3.3 (L) 3.5 - 5.0 g/dL   AST 31 15 - 41 U/L   ALT 21 0 - 44 U/L   Alkaline Phosphatase 60 38 - 126 U/L   Total Bilirubin 1.0 0.3 - 1.2 mg/dL   GFR calc non Af Amer 3 (L) >60 mL/min   GFR calc Af Amer 4 (L) >60 mL/min   Anion gap 17 (H) 5 - 15    Comment: Performed at [facility], [address] [place], [place] [address]  Troponin I -     Status: Abnormal   Collection Time: 02/17/18  3:53 PM  Result Value Ref Range   Troponin I 0.03 (HH) <0.03 ng/mL    Comment: CRITICAL RESULT CALLED TO, READ BACK BY AND VERIFIED WITH VALERIE CHANDLER AT 1629 02/17/2018.PMF Performed at [facility], [address] [place], [place] [address]   Basic metabolic panel     Status: Abnormal   Collection Time: 02/17/18  5:00 PM  Result Value Ref Range   Sodium 136 135 - 145 mmol/L   Potassium 6.0 (H) 3.5 - 5.1 mmol/L   Chloride 99 98 - 111  mmol/L   CO2 21 (L) 22 - 32 mmol/L   Glucose, Bld 108 (H) 70 - 99 mg/dL   BUN 57 (H) 6 -  20 mg/dL   Creatinine, Ser 10.23 (H) 0.44 - 1.00 mg/dL   Calcium 7.9 (L) 8.9 - 10.3 mg/dL   GFR calc non Af Amer 4 (L) >60 mL/min   GFR calc Af Amer 4 (L) >60 mL/min   Anion gap 16 (H) 5 - 15    Comment: Performed at [facility], [address] [place], [place] [address]  Phosphorus     Status: Abnormal   Collection Time: 02/17/18  5:00 PM  Result Value Ref Range   Phosphorus 10.0 (H) 2.5 - 4.6 mg/dL    Comment: Performed at [facility], [address] [place], [place] [address]  Lactic acid, plasma     Status: None   Collection Time: 02/17/18  9:41 PM  Result Value Ref Range   Lactic Acid, Venous 1.7 0.5 - 1.9 mmol/L    Comment: Performed at [facility], [address] [place], [place] [address]  Basic metabolic panel     Status: Abnormal   Collection Time: 02/17/18  9:41 PM  Result Value Ref Range   Sodium 137 135 - 145 mmol/L   Potassium 4.7 3.5 - 5.1 mmol/L   Chloride 98 98 - 111 mmol/L   CO2 27 22 - 32 mmol/L   Glucose, Bld 63 (L) 70 - 99 mg/dL   BUN 31 (H) 6 - 20 mg/dL   Creatinine, Ser 6.61 (H) 0.44 - 1.00 mg/dL   Calcium 8.0 (L) 8.9 - 10.3 mg/dL   GFR calc non Af Amer 6 (L) >60 mL/min   GFR calc Af Amer 7 (L) >60 mL/min   Anion gap 12 5 - 15    Comment: Performed at [facility], [address], [place], [place] [address]   Dg Chest 1 View  Result Date: 02/17/2018 CLINICAL DATA:  Missed dialysis for 1 week.  Short of breath. EXAM: CHEST  1 VIEW COMPARISON:  01/02/2018 . FINDINGS: the heart size is enlarged. There is aortic atherosclerosis. Moderate right pleural effusion and smaller left pleural effusion identified. Moderate diffuse pulmonary edema identified. Nodular density projecting over the left upper lobe is indeterminate. Not confidently identified on previous imaging. IMPRESSION: 1. Bilateral pleural effusions and moderate  pulmonary edema compatible with fluid overload state consistent with missing dialysis. 2. Indeterminate nodular density in the left upper lobe. Not confidently identified on previous imaging. Recommend follow-up imaging after dialysis and resolution of pulmonary symptoms with upright PA and lateral chest radiograph to reassess. If persistent and suspicious a CT of the chest may be obtained. Electronically Signed   By: Kerby Moors M.D.   On: 02/17/2018 16:13    Pending Labs Unresulted Labs (From admission, onward)    Start     Ordered   02/17/18 1631  Hepatitis B surface antigen  Once,   STAT     02/17/18 1630   02/17/18 1544  Urinalysis, Complete w Microscopic  Once,   STAT     02/17/18 1543   02/17/18 1544  Urine Culture  Add-on,   AD     02/17/18 1543   02/17/18 1543  Blood culture (routine x 2)  BLOOD CULTURE X 2,   STAT     02/17/18 1543   Signed and Held  Basic metabolic panel  Tomorrow morning,   R     Signed and Held   Signed and Held  CBC  Tomorrow morning,   R     Signed and Held  Vitals/Pain Today's Vitals   02/17/18 2130 02/17/18 2200 02/17/18 2238 02/17/18 2300  BP: (!) 118/99 118/65 118/65 (!) 132/53  Pulse: 85 87 86 84  Resp: 14 13 12 15   Temp:      TempSrc:      SpO2: 92% 97% 94% 92%  Weight:      PainSc:   Asleep     Isolation Precautions No active isolations  Medications Medications  furosemide (LASIX) 100 mg in dextrose 5 % 50 mL IVPB (100 mg Intravenous Not Given 02/17/18 1648)  Chlorhexidine Gluconate Cloth 2 % PADS 6 each (has no administration in time range)  pentafluoroprop-tetrafluoroeth (GEBAUERS) aerosol 1 application (has no administration in time range)  lidocaine (PF) (XYLOCAINE) 1 % injection 5 mL (has no administration in time range)  lidocaine-prilocaine (EMLA) cream 1 application (has no administration in time range)  heparin injection 1,000 Units (has no administration in time range)  alteplase (CATHFLO ACTIVASE) injection 2 mg  (has no administration in time range)  nystatin (MYCOSTATIN/NYSTOP) topical powder (has no administration in time range)  calcium gluconate inj 10% (1 g) URGENT USE ONLY! (3 g Intravenous Given 02/17/18 1545)  albuterol (PROVENTIL) (2.5 MG/3ML) 0.083% nebulizer solution 10 mg (10 mg Nebulization Given 02/17/18 1541)  sodium bicarbonate injection 50 mEq (50 mEq Intravenous Given 02/17/18 1552)  insulin aspart (novoLOG) injection 10 Units (10 Units Intravenous Given 02/17/18 1625)  dextrose 50 % solution 50 mL (50 mLs Intravenous Given 02/17/18 1625)  sodium bicarbonate injection 50 mEq (50 mEq Intravenous Given 02/17/18 1625)    Mobility walks

## 2018-02-17 NOTE — ED Triage Notes (Signed)
Pt missed dialysis X 1 week. Arrived altered with EMS. Lethargic. Hypoxic. Arrived on NRB with EMS. Wide complex rhythm.

## 2018-02-17 NOTE — ED Notes (Signed)
Report to dialysis RN

## 2018-02-17 NOTE — ED Provider Notes (Signed)
[facility] Emergency Department Provider Note  ____________________________________________   First MD Initiated Contact with Patient 02/17/18 1539     (approximate)  I have reviewed the triage vital signs and the nursing notes.   HISTORY  Chief Complaint Altered Mental Status   HPI Jacqueline Rodriguez is a 58 y.o. female with a history of end-stage renal disease on dialysis was presented emergency department today with altered mental status.  EMS reports that the patient was unable to be awoken by her family.  They were concerned that it could have been a diabetic issue such as hypoglycemia which she was admitted for in December.  However, the patient's blood glucose was found to be 170.  EMS reported that the family stated that the patient had refused to go to dialysis for 1 week because of being "hard headed."  Patient denying any pain at this time.  However, is unable to give a detailed history given her decrease in mentation.   Past Medical History:  Diagnosis Date  . Anemia   . Chronic bronchitis ([place])   . Chronic diastolic CHF (congestive heart failure) ([place])   . CKD (chronic kidney disease), stage III ([place])   . COPD (chronic obstructive pulmonary disease) ([place])   . Diabetes mellitus with renal complications ([place])   . Essential hypertension   . GERD (gastroesophageal reflux disease)   . Obesity   . Peripheral vascular disease ([place])   . Pulmonary hypertension ([place])   . Renal insufficiency 09/19/2015   Stage 3 CKD. Beginning dialysis.    Patient Active Problem List   Diagnosis Date Noted  . Hypoglycemia 12/28/2017  . AMS (altered mental status) 12/21/2017  . Hypotension 09/07/2017  . Peritoneal dialysis catheter exit site infection ([place]) 10/05/2016  . 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 Hypocalcemia 09/14/2016  . Hyperkalemia 09/13/2016  . Syncope 09/08/2016  . ESRD on dialysis ([place]) 05/25/2016  . Chronic diastolic heart failure  ([place]) 07/30/2015  . Acute renal failure superimposed on stage 3 chronic kidney disease ([place]) 07/18/2015  . Diabetes mellitus with renal complications ([place])   . CKD (chronic kidney disease), stage III ([place])   . Pulmonary hypertension ([place])   . Obesity   . Morbid obesity due to excess calories ([place])   . Anemia in chronic renal disease   . Shortness of breath   . Back pain 05/01/2015  . Essential hypertension 03/25/2015  . Diabetes ([place]) 03/25/2015    Past Surgical History:  Procedure Laterality Date  . A/V FISTULAGRAM N/A 09/16/2016   Procedure: A/V Fistulagram;  Surgeon: Algernon Huxley, MD;  Location: [place] [facility];  Service: Cardiovascular;  Laterality: N/A;  . AV FISTULA PLACEMENT Left 10/01/2015   Procedure: ARTERIOVENOUS (AV) FISTULA CREATION ( BRACHIAL CEPHALIC );  Surgeon: Algernon Huxley, MD;  Location: [facility];  Service: Vascular;  Laterality: Left;  . CAPD INSERTION N/A 06/10/2016   Procedure: LAPAROSCOPIC INSERTION CONTINUOUS AMBULATORY PERITONEAL DIALYSIS  (CAPD) CATHETER;  Surgeon: Algernon Huxley, MD;  Location: [facility];  Service: Vascular;  Laterality: N/A;  . DIALYSIS/PERMA CATHETER INSERTION  09/16/2016   Procedure: DIALYSIS/PERMA CATHETER INSERTION;  Surgeon: Algernon Huxley, MD;  Location: [place] [facility];  Service: Cardiovascular;;  . DIALYSIS/PERMA CATHETER REMOVAL N/A 12/06/2016   Procedure: DIALYSIS/PERMA CATHETER REMOVAL;  Surgeon: Algernon Huxley, MD;  Location: [place] [facility];  Service: Cardiovascular;  Laterality: N/A;  . PERIPHERAL VASCULAR CATHETERIZATION N/A 07/21/2015   Procedure: Dialysis/Perma Catheter;  Surgeon: Corene Cornea  Bunnie Domino, MD;  Location: [place] [facility];  Service: Cardiovascular;  Laterality: N/A;  . PERIPHERAL VASCULAR CATHETERIZATION N/A 12/11/2015   Procedure: Dialysis/Perma Catheter Removal;  Surgeon: Algernon Huxley, MD;  Location: [place] [facility];  Service: Cardiovascular;  Laterality: N/A;    Prior to Admission medications   Medication Sig  Start Date End Date Taking? Authorizing Provider  acetaminophen (TYLENOL) 500 MG tablet Take 500 mg by mouth every 6 (six) hours as needed.    [provider]  albuterol (PROVENTIL HFA;VENTOLIN HFA) 108 (90 Base) MCG/ACT inhaler Inhale 2 puffs into the lungs every 6 (six) hours as needed for wheezing or shortness of breath. 01/08/15   Gregor Hams, MD  amLODipine (NORVASC) 5 MG tablet Take 5 mg by mouth daily. 06/22/17   [provider]  aspirin 81 MG EC tablet Take 2 tablets (162 mg total) by mouth at bedtime. 12/31/17   Fritzi Mandes, MD  cetirizine (ZYRTEC) 10 MG tablet Take 10 mg by mouth daily as needed for allergies.    [provider]  cloNIDine (CATAPRES) 0.2 MG tablet Take 0.2 mg by mouth 2 (two) times daily. 07/02/17   [provider]  docusate sodium (COLACE) 100 MG capsule Take 100 mg by mouth 2 (two) times daily.    [provider]  furosemide (LASIX) 40 MG tablet Take 40 mg by mouth daily. 12/04/17   [provider]  gabapentin (NEURONTIN) 100 MG capsule Take 100 mg by mouth 3 (three) times daily as needed. For neuropathy. (patient usually only takes one capsule a day) 03/05/15   [provider]  GLUCOSAMINE-CHONDROITIN PO Take 4,000 mg by mouth daily.    [provider]  hydrALAZINE (APRESOLINE) 25 MG tablet Take 25 mg by mouth 3 (three) times daily.    [provider]  isosorbide mononitrate (IMDUR) 30 MG 24 hr tablet Take 1 tablet by mouth daily.    [provider]  lactulose (CHRONULAC) 10 GM/15ML solution Take 30 mLs by mouth every other day.  08/15/17   [provider]  lidocaine-prilocaine (EMLA) cream Apply topically 3 (three) times a week. Apply 45 minutes prior to dialysis treatment three times a week 10/22/17   [provider]  lisinopril (PRINIVIL,ZESTRIL) 10 MG tablet Take 1 tablet by mouth daily. 08/28/17   [provider]  meclizine (ANTIVERT) 25 MG tablet  Take 1 tablet (25 mg total) by mouth 3 (three) times daily as needed for dizziness. Patient taking differently: Take 25 mg by mouth daily.  12/08/15   Nance Pear, MD  ondansetron (ZOFRAN) 4 MG tablet Take 1 tablet (4 mg total) by mouth every 6 (six) hours as needed for nausea. Patient not taking: Reported on 01/02/2018 09/17/16   Loletha Grayer, MD  sertraline (ZOLOFT) 100 MG tablet Take 1 tablet by mouth daily. 08/02/17   [provider]  sevelamer carbonate (RENVELA) 800 MG tablet Take 1,600 mg by mouth 3 (three) times daily with meals.    [provider]    Allergies Ambien [zolpidem]  Family History  Problem Relation Age of Onset  . Hypertension Mother   . Diabetes Mellitus II Mother   . CAD Father   . Hypertension Father   . Heart attack Father     Social History Social History   Tobacco Use  . Smoking status: Former Smoker    Packs/day: 0.50    Years: 5.00    Pack years: 2.50    Types: Cigarettes  Last attempt to quit: 03/24/1985    Years since quitting: 32.9  . Smokeless tobacco: Never Used  Substance Use Topics  . Alcohol use: No    Alcohol/week: 0.0 standard drinks  . Drug use: No    Review of Systems  Level 5 caveat secondary to altered mental status.   ____________________________________________   PHYSICAL EXAM:  VITAL SIGNS: ED Triage Vitals  Enc Vitals Group     BP 02/17/18 1532 (!) 137/100     Pulse Rate 02/17/18 1532 60     Resp 02/17/18 1532 18     Temp --      Temp src --      SpO2 02/17/18 1532 (!) 80 %     Weight 02/17/18 1534 209 lb 7 oz (95 kg)     Height --      Head Circumference --      Peak Flow --      Pain Score --      Pain Loc --      Pain Edu? --      Excl. in [employer]? --     Constitutional: Patient occasionally mumbles things unintelligibly when not directly spoken to or stimulated.  However, with gentle pressure to her shoulder in a loud voice she is able to answer questions with yes and no answers.   Following commands and moving her hands and feet.  No focal neurologic deficits.  Pupils are PERRL.   Eyes: Conjunctivae are normal.  Head: Atraumatic. Nose: No congestion/rhinnorhea. Mouth/Throat: Mucous membranes are moist.  Neck: No stridor.   Cardiovascular: Irregularly irregular rhythm with bradycardia.. Grossly normal heart sounds.  Good peripheral circulation with palpable thrill to the left upper extremity fistula. Respiratory: Good respirations with rales throughout. Gastrointestinal: Soft and nontender. No distention.  Musculoskeletal: Moderate to severe bilateral lower extremity edema. Neurologic:  Normal speech and language. No gross focal neurologic deficits are appreciated. Skin:  Skin is warm, dry and intact. No rash noted. Psychiatric: Speech and behavior are normal given clinical context  ____________________________________________   LABS (all labs ordered are listed, but only abnormal results are displayed)  Labs Reviewed  CBC WITH DIFFERENTIAL/PLATELET - Abnormal; Notable for the following components:      Result Value   WBC 12.1 (*)    RBC 3.16 (*)    Hemoglobin 9.0 (*)    HCT 28.7 (*)    RDW 15.9 (*)    Neutro Abs 10.9 (*)    Lymphs Abs 0.5 (*)    Abs Immature Granulocytes 0.10 (*)    All other components within normal limits  CULTURE, BLOOD (ROUTINE X 2)  CULTURE, BLOOD (ROUTINE X 2)  URINE CULTURE  BRAIN NATRIURETIC PEPTIDE  LACTIC ACID, PLASMA  LACTIC ACID, PLASMA  URINALYSIS, COMPLETE (UACMP) WITH MICROSCOPIC  BASIC METABOLIC PANEL  COMPREHENSIVE METABOLIC PANEL  TROPONIN I   ____________________________________________  EKG  ED ECG REPORT I, Doran Stabler, the attending physician, personally viewed and interpreted this ECG.   Date: 02/17/2018  EKG Time: 1553  Rate: 59  Rhythm: Wide-complex rhythm.  Appears to be combination of sinus rhythm as well as ectopic beats.  Axis: Normal axis  Intervals:nonspecific intraventricular  conduction delay  ST&T Change: No ST segment elevation or depression.  T wave inversions in aVL. When compared to previous EKGs, this is a wide-complex EKG.  Previously, the patient had a narrow complex rhythm.  ____________________________________________  RADIOLOGY  Bilateral pleural effusions as well as pulmonary edema. ____________________________________________  PROCEDURES  Procedure(s) performed:   .Critical Care Performed by: Orbie Pyo, MD Authorized by: Orbie Pyo, MD   Critical care provider statement:    Critical care time (minutes):  35   Critical care time was exclusive of:  Separately billable procedures and treating other patients   Critical care was necessary to treat or prevent imminent or life-threatening deterioration of the following conditions:  Respiratory failure and metabolic crisis   Critical care was time spent personally by me on the following activities:  Development of treatment plan with patient or surrogate, discussions with consultants, evaluation of patient's response to treatment, examination of patient, obtaining history from patient or surrogate, ordering and performing treatments and interventions, ordering and review of laboratory studies, ordering and review of radiographic studies, pulse oximetry, re-evaluation of patient's condition and review of old charts    Critical Care performed:  Angiocath insertion Performed by: Doran Stabler  Consent: Verbal consent obtained. Risks and benefits: risks, benefits and alternatives were discussed Time out: Immediately prior to procedure a "time out" was called to verify the correct patient, procedure, equipment, support staff and site/side marked as required.  Preparation: Patient was prepped and draped in the usual sterile fashion.  Vein Location: right basilic  Ultrasound Guided  Gauge: 18  Normal blood return and flush without difficulty Patient tolerance:  Patient tolerated the procedure well with no immediate complications.     ____________________________________________   INITIAL IMPRESSION / ASSESSMENT AND PLAN / ED COURSE  Pertinent labs & imaging results that were available during my care of the patient were reviewed by me and considered in my medical decision making (see chart for details).  Differential diagnosis includes, but is not limited to, alcohol, illicit or prescription medications, or other toxic ingestion; intracranial pathology such as stroke or intracerebral hemorrhage; fever or infectious causes including sepsis; hypoxemia and/or hypercarbia; uremia; trauma; endocrine related disorders such as diabetes, hypoglycemia, and thyroid-related diseases; hypertensive encephalopathy; etc. Differential includes, but is not limited to, viral syndrome, bronchitis including COPD exacerbation, pneumonia, reactive airway disease including asthma, CHF including exacerbation with or without pulmonary/interstitial edema, pneumothorax, ACS, thoracic trauma, and pulmonary embolism. As part of my medical decision making, I reviewed the following data within the electronic MEDICAL RECORD NUMBER Notes from prior ED visits  ----------------------------------------- 4:48 PM on 02/17/2018 -----------------------------------------  Patient initially with wide-complex, bradycardic rhythm.  Given albuterol, bicarb, 3 g of calcium gluconate, insulin and dextrose with resultant narrowing of the QRS.  Patient has a decreased mentation but is AO x3 with minimal stimulation.  Following commands.  Nonfocal neuro exam.  Patient on nonrebreather mask at 95 to 96%.  Discussed the case with Dr. Zollie Scale of nephrology who says that he is able to dialyze the patient emergently.  However, patient placed on BiPAP because I feel like the positive pressure would benefit her given the fluid overload seen on her chest x-ray.  The dialysis nursing staff is refusing to take the  patient while on BiPAP.  She will be transitioned back to the nonrebreather mask where she had good oxygen saturation but would not have the benefit of positive pressure.  However, I feel that the most important thing at this time is to have the patient dialyzed and fluid taken off and her electrolyte abnormalities corrected.  Patient signed out to Dr. Tressia Miners.  Patient also found to be hypothermic.  Likely secondary to poor circulatory status secondary to metabolic crisis. ____________________________________________   FINAL CLINICAL IMPRESSION(S) / ED  DIAGNOSES  Dialysis noncompliance.  Hyperkalemia.  Hypoxia.  Pulmonary edema.    NEW MEDICATIONS STARTED DURING THIS VISIT:  New Prescriptions   No medications on file     Note:  This document was prepared using Dragon voice recognition software and may include unintentional dictation errors.     Orbie Pyo, MD 02/17/18 [phone]

## 2018-02-17 NOTE — ED Notes (Signed)
To dialysis with RN, monitor, and zoll on bed

## 2018-02-17 NOTE — Progress Notes (Signed)
Patient came in with altered mental status and hyperkalemia after missing few sessions of dialysis -Had emergency dialysis done here.  Much more alert and oriented.  Needing supplemental oxygen but off BiPAP -Repeat labs are pending.  Will send to telemetry instead of ICU

## 2018-02-17 NOTE — ED Notes (Signed)
This RN received reports from Dialysis RN Freida Busman. Per RN, pt is 95% RA, BP 141/80, and 1500 ml of fluid was removed.

## 2018-02-17 NOTE — ED Notes (Signed)
Pt cardiac rhythm wide complex again and irregular. Dr Clearnce Hasten notified.

## 2018-02-17 NOTE — Progress Notes (Signed)
Pre HD assessment    02/17/18 1706  Neurological  Level of Consciousness Responds to Voice  Respiratory  Respiratory Pattern Labored;Regular  Chest Assessment Chest expansion symmetrical  Cardiac  Pulse Irregular  Vascular  R Radial Pulse +1  L Radial Pulse +1  Edema Generalized;Right upper extremity;Left upper extremity;Right lower extremity;Left lower extremity  Integumentary  Integumentary (WDL) X  Skin Color Appropriate for ethnicity  Musculoskeletal  Musculoskeletal (WDL) X  Generalized Weakness Yes  Assistive Device None  GU Assessment  Genitourinary (WDL) X  Genitourinary Symptoms  (HD)  Psychosocial  Psychosocial (WDL) WDL

## 2018-02-18 LAB — BLOOD CULTURE ID PANEL (REFLEXED)
Acinetobacter baumannii: NOT DETECTED
CANDIDA ALBICANS: NOT DETECTED
Candida glabrata: NOT DETECTED
Candida krusei: NOT DETECTED
Candida parapsilosis: NOT DETECTED
Candida tropicalis: NOT DETECTED
Enterobacter cloacae complex: NOT DETECTED
Enterobacteriaceae species: NOT DETECTED
Enterococcus species: NOT DETECTED
Escherichia coli: NOT DETECTED
Haemophilus influenzae: NOT DETECTED
KLEBSIELLA OXYTOCA: NOT DETECTED
Klebsiella pneumoniae: NOT DETECTED
Listeria monocytogenes: NOT DETECTED
Methicillin resistance: DETECTED — AB
Neisseria meningitidis: NOT DETECTED
Proteus species: NOT DETECTED
Pseudomonas aeruginosa: NOT DETECTED
STREPTOCOCCUS PYOGENES: NOT DETECTED
Serratia marcescens: NOT DETECTED
Staphylococcus aureus (BCID): NOT DETECTED
Staphylococcus species: DETECTED — AB
Streptococcus agalactiae: NOT DETECTED
Streptococcus pneumoniae: NOT DETECTED
Streptococcus species: NOT DETECTED

## 2018-02-18 LAB — CBC
HCT: 21.7 % — ABNORMAL LOW (ref 36.0–46.0)
Hemoglobin: 7 g/dL — ABNORMAL LOW (ref 12.0–15.0)
MCH: 28.5 pg (ref 26.0–34.0)
MCHC: 32.3 g/dL (ref 30.0–36.0)
MCV: 88.2 fL (ref 80.0–100.0)
Platelets: 255 10*3/uL (ref 150–400)
RBC: 2.46 MIL/uL — ABNORMAL LOW (ref 3.87–5.11)
RDW: 15.7 % — ABNORMAL HIGH (ref 11.5–15.5)
WBC: 8.8 10*3/uL (ref 4.0–10.5)
nRBC: 0 % (ref 0.0–0.2)

## 2018-02-18 LAB — GLUCOSE, CAPILLARY: Glucose-Capillary: 92 mg/dL (ref 70–99)

## 2018-02-18 LAB — BASIC METABOLIC PANEL
Anion gap: 11 (ref 5–15)
BUN: 35 mg/dL — ABNORMAL HIGH (ref 6–20)
CALCIUM: 7.9 mg/dL — AB (ref 8.9–10.3)
CO2: 27 mmol/L (ref 22–32)
Chloride: 98 mmol/L (ref 98–111)
Creatinine, Ser: 7.22 mg/dL — ABNORMAL HIGH (ref 0.44–1.00)
GFR calc Af Amer: 6 mL/min — ABNORMAL LOW (ref >60)
GFR calc non Af Amer: 6 mL/min — ABNORMAL LOW (ref >60)
Glucose, Bld: 67 mg/dL — ABNORMAL LOW (ref 70–99)
Potassium: 5.4 mmol/L — ABNORMAL HIGH (ref 3.5–5.1)
Sodium: 136 mmol/L (ref 135–145)

## 2018-02-18 LAB — MRSA PCR SCREENING: MRSA by PCR: NEGATIVE

## 2018-02-18 LAB — PHOSPHORUS: Phosphorus: 5.1 mg/dL — ABNORMAL HIGH (ref 2.5–4.6)

## 2018-02-18 MED ORDER — ASPIRIN EC 81 MG PO TBEC
162.0000 mg | DELAYED_RELEASE_TABLET | Freq: Every day | ORAL | Status: DC
  Administered 2018-02-18 (×2): 162 mg via ORAL
  Filled 2018-02-18 (×3): qty 2

## 2018-02-18 MED ORDER — HYDRALAZINE HCL 25 MG PO TABS
25.0000 mg | ORAL_TABLET | Freq: Three times a day (TID) | ORAL | Status: DC
  Administered 2018-02-18 – 2018-02-19 (×3): 25 mg via ORAL
  Filled 2018-02-18 (×3): qty 1

## 2018-02-18 MED ORDER — CLONIDINE HCL 0.1 MG PO TABS
0.2000 mg | ORAL_TABLET | Freq: Two times a day (BID) | ORAL | Status: DC
  Administered 2018-02-18 – 2018-02-19 (×4): 0.2 mg via ORAL
  Filled 2018-02-18 (×4): qty 2

## 2018-02-18 MED ORDER — HEPARIN SODIUM (PORCINE) 5000 UNIT/ML IJ SOLN
5000.0000 [IU] | Freq: Three times a day (TID) | INTRAMUSCULAR | Status: DC
  Administered 2018-02-18 – 2018-02-19 (×3): 5000 [IU] via SUBCUTANEOUS
  Filled 2018-02-18 (×3): qty 1

## 2018-02-18 MED ORDER — ACETAMINOPHEN 650 MG RE SUPP: 650.0000 mg | Freq: Four times a day (QID) | RECTAL | Status: DC | PRN

## 2018-02-18 MED ORDER — ACETAMINOPHEN 325 MG PO TABS
650.0000 mg | ORAL_TABLET | Freq: Four times a day (QID) | ORAL | Status: DC | PRN
  Administered 2018-02-18: 650 mg via ORAL
  Filled 2018-02-18: qty 2

## 2018-02-18 MED ORDER — GABAPENTIN 100 MG PO CAPS
100.0000 mg | ORAL_CAPSULE | Freq: Three times a day (TID) | ORAL | Status: DC
  Administered 2018-02-18 – 2018-02-19 (×4): 100 mg via ORAL
  Filled 2018-02-18 (×4): qty 1

## 2018-02-18 MED ORDER — LIDOCAINE-PRILOCAINE 2.5-2.5 % EX CREA
TOPICAL_CREAM | CUTANEOUS | Status: DC
  Filled 2018-02-18: qty 5

## 2018-02-18 MED ORDER — ONDANSETRON HCL 4 MG PO TABS: 4.0000 mg | ORAL_TABLET | Freq: Four times a day (QID) | ORAL | Status: DC | PRN

## 2018-02-18 MED ORDER — ISOSORBIDE MONONITRATE ER 30 MG PO TB24
30.0000 mg | ORAL_TABLET | Freq: Every day | ORAL | Status: DC
  Administered 2018-02-18 – 2018-02-19 (×2): 30 mg via ORAL
  Filled 2018-02-18 (×2): qty 1

## 2018-02-18 MED ORDER — EPOETIN ALFA 10000 UNIT/ML IJ SOLN
10000.0000 [IU] | INTRAMUSCULAR | Status: DC
  Administered 2018-02-18: 10000 [IU] via INTRAVENOUS

## 2018-02-18 MED ORDER — AMLODIPINE BESYLATE 5 MG PO TABS
5.0000 mg | ORAL_TABLET | Freq: Every day | ORAL | Status: DC
  Administered 2018-02-18 – 2018-02-19 (×2): 5 mg via ORAL
  Filled 2018-02-18 (×2): qty 1

## 2018-02-18 MED ORDER — ONDANSETRON HCL 4 MG/2ML IJ SOLN: 4.0000 mg | Freq: Four times a day (QID) | INTRAMUSCULAR | Status: DC | PRN

## 2018-02-18 NOTE — Progress Notes (Signed)
[place] [facility] [place] NAME: Jacqueline Rodriguez    MR#:  [id number]  DATE OF BIRTH:  [birth year]  SUBJECTIVE:   Patient still requiring some oxygen.  Shortness of breath is improved since yesterday.  Patient going for dialysis today.  REVIEW OF SYSTEMS:    Review of Systems  Constitutional: Negative for fever, chills weight loss HENT: Negative for ear pain, nosebleeds, congestion, facial swelling, rhinorrhea, neck pain, neck stiffness and ear discharge.   Respiratory: Negative for cough, +MILD shortness of breath, no wheezing  Cardiovascular: Negative for chest pain, palpitations and ++leg swelling.  Gastrointestinal: Negative for heartburn, abdominal pain, vomiting, diarrhea or consitpation Genitourinary: Negative for dysuria, urgency, frequency, hematuria Musculoskeletal: Negative for back pain or joint pain Neurological: Negative for dizziness, seizures, syncope, focal weakness,  numbness and headaches.  Hematological: Does not bruise/bleed easily.  Psychiatric/Behavioral: Negative for hallucinations, confusion, dysphoric mood    Tolerating Diet: yes      DRUG ALLERGIES:   Allergies  Allergen Reactions  . Ambien [Zolpidem] Other (See Comments)    hallucinations    VITALS:  Blood pressure (!) 126/36, pulse 81, temperature 99.1 F (37.3 C), temperature source Oral, resp. rate 20, height 5\' 9"  (1.753 m), weight 94.7 kg, last menstrual period 09/18/2013, SpO2 100 %.  PHYSICAL EXAMINATION:  Constitutional: Appears well-developed and well-nourished. No distress. HENT: Normocephalic. [name] Oropharynx is clear and moist.  Eyes: Conjunctivae and EOM are normal. PERRLA, no scleral icterus.  Neck: Normal ROM. Neck supple. No JVD. No tracheal deviation. CVS: RRR, S1/S2 +, no murmurs, no gallops, no carotid bruit.  Pulmonary: Effort and breath sounds normal, no stridor, rhonchi, wheezes, rales.  Abdominal: Soft. BS +,  no distension, tenderness,  rebound or guarding.  Musculoskeletal: Normal range of motion. 2+ LEE and no tenderness.  Neuro: Alert. CN 2-12 grossly intact. No focal deficits. Skin: Skin is warm and dry. No rash noted. Psychiatric: Normal mood and affect.      LABORATORY PANEL:   CBC Recent Labs  Lab 02/18/18 0434  WBC 8.8  HGB 7.0*  HCT 21.7*  PLT 255   ------------------------------------------------------------------------------------------------------------------  Chemistries  Recent Labs  Lab 02/17/18 1553  02/18/18 0434  NA 132*   < > 136  K 7.5*   < > 5.4*  CL 97*   < > 98  CO2 18*   < > 27  GLUCOSE 145*   < > 67*  BUN 63*   < > 35*  CREATININE 10.68*   < > 7.22*  CALCIUM 9.6   < > 7.9*  AST 31  --   --   ALT 21  --   --   ALKPHOS 60  --   --   BILITOT 1.0  --   --    < > = values in this interval not displayed.   ------------------------------------------------------------------------------------------------------------------  Cardiac Enzymes Recent Labs  Lab 02/17/18 1553  TROPONINI 0.03*   ------------------------------------------------------------------------------------------------------------------  RADIOLOGY:  Dg Chest 1 View  Result Date: 02/17/2018 CLINICAL DATA:  Missed dialysis for 1 week.  Short of breath. EXAM: CHEST  1 VIEW COMPARISON:  01/02/2018 . FINDINGS: the heart size is enlarged. There is aortic atherosclerosis. Moderate right pleural effusion and smaller left pleural effusion identified. Moderate diffuse pulmonary edema identified. Nodular density projecting over the left upper lobe is indeterminate. Not confidently identified on previous imaging. IMPRESSION: 1. Bilateral pleural effusions and moderate pulmonary edema compatible with fluid overload state consistent with missing dialysis.  2. Indeterminate nodular density in the left upper lobe. Not confidently identified on previous imaging. Recommend follow-up imaging after dialysis and resolution of pulmonary  symptoms with upright PA and lateral chest radiograph to reassess. If persistent and suspicious a CT of the chest may be obtained. Electronically Signed   By: Kerby Moors M.D.   On: 02/17/2018 16:13     ASSESSMENT AND PLAN:   58 year old female with end-stage renal disease on hemodialysis, peripheral vascular disease and chronic diastolic heart failure who presented to the emergency room due to confusion.   1.  Acute metabolic encephalopathy in the setting of hypoxic respiratory failure: Patient's mental status is at baseline  2.  Acute hypoxic respiratory failure in the setting of missing dialysis for 1 week: Patient was on BiPAP now titrated to nasal cannula  3.  Chronic diastolic heart failure with preserved ejection fraction: Etiology of pulmonary edema is due to missing dialysis  4.  Hyperkalemia: Patient will undergo dialysis again  5.  End-stage renal disease on hemodialysis: Dialysis as per nephrology.   Management plans discussed with the patient and she is in agreement.  CODE STATUS: full  TOTAL TIME TAKING CARE OF THIS PATIENT: 30 minutes.   D/w dr Holley Raring  POSSIBLE D/C tomorrow, DEPENDING ON CLINICAL CONDITION.   [name] M.D on 02/18/2018 at 1:14 PM  Between 7am to 6pm - Pager - [phone] After 6pm go to [email or web address] - password [email or web address] [facility]  Office  [phone]  CC: Primary care physician; Tracie Harrier, MD  Note: This dictation was prepared with Dragon dictation along with smaller phrase technology. Any transcriptional errors that result from this process are unintentional.

## 2018-02-18 NOTE — Progress Notes (Signed)
Post HD Assessment: Tolerated Tx well, 2168mL net UF removal. Vital signs stable, ok to return to room.    02/18/18 1953  Neurological  Level of Consciousness Alert  Orientation Level Oriented X4  Respiratory  Respiratory Pattern Regular;Dyspnea with exertion  Chest Assessment Chest expansion symmetrical  Bilateral Breath Sounds Diminished;Expiratory wheezes  Cough Non-productive  Cardiac  Pulse Regular  Cardiac Rhythm NSR  Vascular  R Radial Pulse +2  L Radial Pulse +2  Edema Generalized  Generalized Edema +1  Integumentary  Integumentary (WDL) X  Skin Color Appropriate for ethnicity  Skin Condition Dry (ecchymosis noted,prior multiple hand/arm venipunct. attempt)  Musculoskeletal  Musculoskeletal (WDL) X  Generalized Weakness Yes  Gastrointestinal  Bowel Sounds Assessment Active  GU Assessment  Genitourinary (WDL) X  Genitourinary Symptoms Oliguria (HD)  Psychosocial  Psychosocial (WDL) WDL

## 2018-02-18 NOTE — Progress Notes (Signed)
Pre HD Tx   02/18/18 1515  Vital Signs  Temp Source Oral  Pulse Rate 80  Pulse Rate Source Monitor  Resp 20  BP 135/63  BP Location Right Arm  BP Method Automatic  Patient Position (if appropriate) Lying  Oxygen Therapy  SpO2 100 %  O2 Device Nasal Cannula  O2 Flow Rate (L/min) 5 L/min  Pain Assessment  Pain Scale 0-10  Pain Score 0  Dialysis Weight  Weight 94.7 kg  Type of Weight Pre-Dialysis  Time-Out for Hemodialysis  What Procedure? HD  Pt Identifiers(min of two) First/Last Name;MRN/Account#  Correct Site? Yes  Correct Side? Yes  Correct Procedure? Yes  Consents Verified? Yes  Rad Studies Available? N/A  Safety Precautions Reviewed? Yes  Engineer, civil (consulting) Number 1  Station Number 2  UF/Alarm Test Passed  Conductivity: Meter 14  Conductivity: Machine  14  pH 7.4  Reverse Osmosis main  Normal Saline Lot Number [id number]  Dialyzer Lot Number [id number]  Disposable Set Lot Number [id number]  Machine Temperature 98.6 F (37 C)  Musician and Audible Yes  Blood Lines Intact and Secured Yes  Pre Treatment Patient Checks  Vascular access used during treatment Fistula  Hepatitis B Surface Antigen Results Negative  Date Hepatitis B Surface Antigen Drawn 01/26/18  Hepatitis B Surface Antibody  (<10)  Date Hepatitis B Surface Antibody Drawn 01/26/18  Hemodialysis Consent Verified Yes  Hemodialysis Standing Orders Initiated Yes  ECG (Telemetry) Monitor On Yes  Prime Ordered Normal Saline  Length of  DialysisTreatment -hour(s) 3.5 Hour(s)  Dialyzer Elisio 17H NR  Dialysate 2K, 2.5 Ca  Dialysate Flow Ordered 800  Blood Flow Rate Ordered 350 mL/min  Ultrafiltration Goal 2 Liters  Pre Treatment Labs Phosphorus  Dialysis Blood Pressure Support Ordered Normal Saline  Education / Care Plan  Dialysis Education Provided Yes  Documented Education in Care Plan Yes  Fistula / Graft Left Upper arm Arteriovenous fistula  Placement Date/Time: 10/01/15 1110   Placed  prior to admission: No  Orientation: Left  Access Location: Upper arm  Access Type: Arteriovenous fistula  Site Condition No complications  Fistula / Graft Assessment Bruit;Thrill;Present  Status Patent

## 2018-02-18 NOTE — Progress Notes (Signed)
HD Tx Initiation   02/18/18 1540  Vital Signs  Pulse Rate 80  Pulse Rate Source Monitor  Resp 20  BP 114/60  BP Location Right Arm  BP Method Automatic  Patient Position (if appropriate) Lying  Oxygen Therapy  SpO2 99 %  O2 Device Nasal Cannula  O2 Flow Rate (L/min) 5 L/min  During Hemodialysis Assessment  Blood Flow Rate (mL/min) 200 mL/min  Arterial Pressure (mmHg) -70 mmHg  Venous Pressure (mmHg) 60 mmHg  Transmembrane Pressure (mmHg) 40 mmHg  Ultrafiltration Rate (mL/min) 710 mL/min  Dialysate Flow Rate (mL/min) 800 ml/min  Conductivity: Machine  13.8  HD Safety Checks Performed Yes  Dialysis Fluid Bolus Normal Saline  Bolus Amount (mL) 250 mL  Intra-Hemodialysis Comments Tx initiated (tx initiated, cannulated successfully, BFR to 350)  Fistula / Graft Left Upper arm Arteriovenous fistula  Placement Date/Time: 10/01/15 1110   Placed prior to admission: No  Orientation: Left  Access Location: Upper arm  Access Type: Arteriovenous fistula  Site Condition No complications  Fistula / Graft Assessment Thrill;Bruit;Present  Status Accessed;Patent  Needle Size 16  (per HD facility MD order )  Drainage Description None

## 2018-02-18 NOTE — Progress Notes (Signed)
Pre Tx HD Assessment   02/18/18 1515  Neurological  Level of Consciousness Alert  Orientation Level Oriented X4  Respiratory  Respiratory Pattern Regular;Labored  Chest Assessment Chest expansion symmetrical  Bilateral Breath Sounds Diminished;Expiratory wheezes  Cough Non-productive  Cardiac  Pulse Regular  Cardiac Rhythm NSR  Vascular  R Radial Pulse +2  L Radial Pulse +2  Edema Generalized  Generalized Edema +1  Integumentary  Integumentary (WDL) X  Skin Color Appropriate for ethnicity  Skin Condition Dry (ecchymosis noted,prior multiple hand/arm venipunct. attempt)  Musculoskeletal  Musculoskeletal (WDL) X  Generalized Weakness Yes  Gastrointestinal  Bowel Sounds Assessment Active  Last BM Date 02/17/18  GU Assessment  Genitourinary (WDL) X  Genitourinary Symptoms Oliguria (HD)  Psychosocial  Psychosocial (WDL) WDL

## 2018-02-18 NOTE — Progress Notes (Signed)
Post HDTx   02/18/18 1915  Vital Signs  Pulse Rate 81  Resp 10  BP 133/67  Oxygen Therapy  SpO2 95 %  During Hemodialysis Assessment  Blood Flow Rate (mL/min) 200 mL/min  Arterial Pressure (mmHg) -110 mmHg  Venous Pressure (mmHg) 180 mmHg  Transmembrane Pressure (mmHg) 50 mmHg  Ultrafiltration Rate (mL/min) 980 mL/min  Dialysate Flow Rate (mL/min) 900 ml/min  Conductivity: Machine  13.8  HD Safety Checks Performed Yes  Dialysis Fluid Bolus Normal Saline  Bolus Amount (mL) 250 mL  Intra-Hemodialysis Comments Tolerated well;Tx completed

## 2018-02-18 NOTE — Progress Notes (Signed)
[facility]  ROUNDING NOTE   Subjective:  Patient underwent hemodialysis urgently yesterday. Much more awake and alert today. Potassium still a bit high at 5.4. Hemoglobin also low at 7.0.   Objective:  Vital signs in last 24 hours:  Temp:  [94.8 F (34.9 C)-99.7 F (37.6 C)] 99.1 F (37.3 C) (02/08 0817) Pulse Rate:  [50-122] 81 (02/08 0817) Resp:  [11-23] 20 (02/08 0817) BP: (99-158)/(36-146) 126/36 (02/08 0817) SpO2:  [80 %-100 %] 100 % (02/08 0817) Weight:  [94.7 kg-106.6 kg] 94.7 kg (02/08 0014)  Weight change:  Filed Weights   02/17/18 1705 02/17/18 1712 02/18/18 0014  Weight: 106.6 kg 106.6 kg 94.7 kg    Intake/Output: I/O last 3 completed shifts: In: -  Out: 1700 [Other:1700]   Intake/Output this shift:  No intake/output data recorded.  Physical Exam: General: No acute distress  Head: Normocephalic, atraumatic. Moist oral mucosal membranes  Eyes: Anicteric  Neck: Supple, trachea midline  Lungs:  Bilateral rales, normal effort  Heart: S1S2 no rubs  Abdomen:  Soft, nontender, bowel sounds present  Extremities: 3+ peripheral edema.  Neurologic: Awake, alert, follows commands  Skin: No lesions  Access: AVF    Basic Metabolic Panel: Recent Labs  Lab 02/17/18 1553 02/17/18 1700 02/17/18 2141 02/18/18 0434  NA 132* 136 137 136  K 7.5* 6.0* 4.7 5.4*  CL 97* 99 98 98  CO2 18* 21* 27 27  GLUCOSE 145* 108* 63* 67*  BUN 63* 57* 31* 35*  CREATININE 10.68* 10.23* 6.61* 7.22*  CALCIUM 9.6 7.9* 8.0* 7.9*  PHOS  --  10.0*  --   --     Liver Function Tests: Recent Labs  Lab 02/17/18 1553  AST 31  ALT 21  ALKPHOS 60  BILITOT 1.0  PROT 6.6  ALBUMIN 3.3*   No results for input(s): LIPASE, AMYLASE in the last 168 hours. No results for input(s): AMMONIA in the last 168 hours.  CBC: Recent Labs  Lab 02/17/18 1528 02/18/18 0434  WBC 12.1* 8.8  NEUTROABS 10.9*  --   HGB 9.0* 7.0*  HCT 28.7* 21.7*  MCV 90.8 88.2  PLT 311 255     Cardiac Enzymes: Recent Labs  Lab 02/17/18 1553  TROPONINI 0.03*    BNP: Invalid input(s): POCBNP  CBG: No results for input(s): GLUCAP in the last 168 hours.  Microbiology: Results for orders placed or performed during the hospital encounter of 02/17/18  Blood culture (routine x 2)     Status: None (Preliminary result)   Collection Time: 02/17/18  3:53 PM  Result Value Ref Range Status   Specimen Description BLOOD RIGHT ANTECUBITAL  Final   Special Requests   Final    BOTTLES DRAWN AEROBIC AND ANAEROBIC Blood Culture results may not be optimal due to an excessive volume of blood received in culture bottles   Culture   Final    NO GROWTH < 24 HOURS Performed at [facility], [address] [place], [place] [address]    Report Status PENDING  Incomplete  Blood culture (routine x 2)     Status: None (Preliminary result)   Collection Time: 02/17/18  9:41 PM  Result Value Ref Range Status   Specimen Description BLOOD RIGHT ANTECUBITAL  Final   Special Requests   Final    BOTTLES DRAWN AEROBIC AND ANAEROBIC Blood Culture results may not be optimal due to an excessive volume of blood received in culture bottles   Culture   Final  NO GROWTH < 12 HOURS Performed at [facility], [address] [place], [place] [address]    Report Status PENDING  Incomplete  MRSA PCR Screening     Status: None   Collection Time: 02/18/18  1:11 AM  Result Value Ref Range Status   MRSA by PCR NEGATIVE NEGATIVE Final    Comment:        The GeneXpert MRSA Assay (FDA approved for NASAL specimens only), is one component of a comprehensive MRSA colonization surveillance program. It is not intended to diagnose MRSA infection nor to guide or monitor treatment for MRSA infections. Performed at [facility], [address] [place], [place] [address]     Coagulation Studies: No results for input(s): LABPROT, INR in the last 72 hours.  Urinalysis: No  results for input(s): COLORURINE, LABSPEC, PHURINE, GLUCOSEU, HGBUR, BILIRUBINUR, KETONESUR, PROTEINUR, UROBILINOGEN, NITRITE, LEUKOCYTESUR in the last 72 hours.  Invalid input(s): APPERANCEUR    Imaging: Dg Chest 1 View  Result Date: 02/17/2018 CLINICAL DATA:  Missed dialysis for 1 week.  Short of breath. EXAM: CHEST  1 VIEW COMPARISON:  01/02/2018 . FINDINGS: the heart size is enlarged. There is aortic atherosclerosis. Moderate right pleural effusion and smaller left pleural effusion identified. Moderate diffuse pulmonary edema identified. Nodular density projecting over the left upper lobe is indeterminate. Not confidently identified on previous imaging. IMPRESSION: 1. Bilateral pleural effusions and moderate pulmonary edema compatible with fluid overload state consistent with missing dialysis. 2. Indeterminate nodular density in the left upper lobe. Not confidently identified on previous imaging. Recommend follow-up imaging after dialysis and resolution of pulmonary symptoms with upright PA and lateral chest radiograph to reassess. If persistent and suspicious a CT of the chest may be obtained. Electronically Signed   By: Kerby Moors M.D.   On: 02/17/2018 16:13     Medications:   . furosemide     . amLODipine  5 mg Oral Daily  . aspirin EC  162 mg Oral QHS  . Chlorhexidine Gluconate Cloth  6 each Topical Q0600  . cloNIDine  0.2 mg Oral BID  . gabapentin  100 mg Oral TID  . heparin  5,000 Units Subcutaneous Q8H  . hydrALAZINE  25 mg Oral TID  . isosorbide mononitrate  30 mg Oral Daily  . [START ON 02/20/2018] lidocaine-prilocaine   Topical Once per day on Mon Wed Fri  . nystatin   Topical TID   acetaminophen **OR** acetaminophen, alteplase, heparin, lidocaine (PF), lidocaine-prilocaine, ondansetron **OR** ondansetron (ZOFRAN) IV, pentafluoroprop-tetrafluoroeth  Assessment/ Plan:  58 y.o. female with end-stage renal disease, history of noncompliance, currently hemodialysis, diabetes,  GERD, COPD, peripheral vascular disease   [facility]/left arm AV fistula  1.  ESRD on HD TTS.  Patient with documented dialysis nonadherence of both peritoneal dialysis and hemodialysis.  Missed dialysis for 1 week.   -We will plan for another dialysis session today as she still has a bit of hyperkalemia.  Patient counseled on the importance of keeping regularly scheduled dialysis appointments.  2.  Severe hyperkalemia.  Potassium down to 5.4.  This should normalize today with dialysis treatment.  3.  Anemia of chronic kidney disease.  Hemoglobin currently 7.0.  Continue to monitor closely.  Start the patient on Epogen 10,000 units IV with dialysis.  4.  Secondary hyperparathyroidism.  Phosphorus noted to be quite high due to lack of dialysis.  Repeat serum phosphorus today.   LOS: 1 Jacqueline Rodriguez 2/8/20201:13 PM

## 2018-02-18 NOTE — Progress Notes (Signed)
Post HD Tx Assessment   02/18/18 1921  Vital Signs  Temp 97.9 F (36.6 C)  Temp Source Oral  Pulse Rate 85  Pulse Rate Source Monitor  Resp 14  BP 134/67  BP Location Right Arm  BP Method Automatic  Patient Position (if appropriate) Lying  Oxygen Therapy  SpO2 95 %  Post-Hemodialysis Assessment  Rinseback Volume (mL) 2850 mL  KECN  (BVP)  Dialyzer Clearance Lightly streaked  Duration of HD Treatment -hour(s) 3.5 hour(s)  Hemodialysis Intake (mL) 600 mL  UF Total -Machine (mL) 2700 mL  Net UF (mL) 2100 mL  Tolerated HD Treatment Yes  AVG/AVF Arterial Site Held (minutes) 10 minutes  AVG/AVF Venous Site Held (minutes) 10 minutes  Fistula / Graft Left Upper arm Arteriovenous fistula  Placement Date/Time: 10/01/15 1110   Placed prior to admission: No  Orientation: Left  Access Location: Upper arm  Access Type: Arteriovenous fistula  Site Condition No complications  Fistula / Graft Assessment Thrill;Bruit;Present  Status Deaccessed  Drainage Description None

## 2018-02-19 LAB — FOLATE: FOLATE: 3.8 ng/mL — AB (ref >5.9)

## 2018-02-19 LAB — RETICULOCYTES
Immature Retic Fract: 18.3 % — ABNORMAL HIGH (ref 2.3–15.9)
RBC.: 2.36 MIL/uL — ABNORMAL LOW (ref 3.87–5.11)
Retic Count, Absolute: 40.1 10*3/uL (ref 19.0–186.0)
Retic Ct Pct: 1.7 % (ref 0.4–3.1)

## 2018-02-19 LAB — CBC
HCT: 20.8 % — ABNORMAL LOW (ref 36.0–46.0)
Hemoglobin: 6.6 g/dL — ABNORMAL LOW (ref 12.0–15.0)
MCH: 28.4 pg (ref 26.0–34.0)
MCHC: 31.7 g/dL (ref 30.0–36.0)
MCV: 89.7 fL (ref 80.0–100.0)
Platelets: 238 10*3/uL (ref 150–400)
RBC: 2.32 MIL/uL — ABNORMAL LOW (ref 3.87–5.11)
RDW: 15.9 % — ABNORMAL HIGH (ref 11.5–15.5)
WBC: 6.3 10*3/uL (ref 4.0–10.5)
nRBC: 0 % (ref 0.0–0.2)

## 2018-02-19 LAB — BASIC METABOLIC PANEL
ANION GAP: 6 (ref 5–15)
BUN: 16 mg/dL (ref 6–20)
CO2: 31 mmol/L (ref 22–32)
Calcium: 7.5 mg/dL — ABNORMAL LOW (ref 8.9–10.3)
Chloride: 100 mmol/L (ref 98–111)
Creatinine, Ser: 4.45 mg/dL — ABNORMAL HIGH (ref 0.44–1.00)
GFR calc Af Amer: 12 mL/min — ABNORMAL LOW (ref >60)
GFR calc non Af Amer: 10 mL/min — ABNORMAL LOW (ref >60)
Glucose, Bld: 116 mg/dL — ABNORMAL HIGH (ref 70–99)
POTASSIUM: 4.1 mmol/L (ref 3.5–5.1)
Sodium: 137 mmol/L (ref 135–145)

## 2018-02-19 LAB — IRON AND TIBC
Iron: 28 ug/dL (ref 28–170)
Saturation Ratios: 15 % (ref 10.4–31.8)
TIBC: 189 ug/dL — ABNORMAL LOW (ref 250–450)
UIBC: 161 ug/dL

## 2018-02-19 LAB — PREPARE RBC (CROSSMATCH)

## 2018-02-19 LAB — FERRITIN: Ferritin: 536 ng/mL — ABNORMAL HIGH (ref 11–307)

## 2018-02-19 LAB — HEPATITIS B SURFACE ANTIGEN: Hepatitis B Surface Ag: NEGATIVE

## 2018-02-19 MED ORDER — SODIUM CHLORIDE 0.9% IV SOLUTION
Freq: Once | INTRAVENOUS | Status: AC
  Administered 2018-02-19: 14:00:00 via INTRAVENOUS

## 2018-02-19 NOTE — Progress Notes (Signed)
[facility]  ROUNDING NOTE   Subjective:  Patient underwent hemodialysis yesterday. Tolerated well. Hemoglobin low today at 6.6. Blood transfusion planned.   Objective:  Vital signs in last 24 hours:  Temp:  [97.3 F (36.3 C)-97.9 F (36.6 C)] 97.3 F (36.3 C) (02/09 0835) Pulse Rate:  [72-85] 75 (02/09 0915) Resp:  [9-20] 18 (02/09 0308) BP: (102-148)/(46-109) 130/65 (02/09 0835) SpO2:  [91 %-100 %] 91 % (02/09 0915) Weight:  [93.9 kg-94.7 kg] 93.9 kg (02/09 0442)  Weight change: -0.3 kg Filed Weights   02/18/18 0014 02/18/18 1515 02/19/18 0442  Weight: 94.7 kg 94.7 kg 93.9 kg    Intake/Output: I/O last 3 completed shifts: In: -  Out: 3800 [Other:3800]   Intake/Output this shift:  No intake/output data recorded.  Physical Exam: General: No acute distress  Head: Normocephalic, atraumatic. Moist oral mucosal membranes  Eyes: Anicteric  Neck: Supple, trachea midline  Lungs:  Bilateral rales, normal effort  Heart: S1S2 no rubs  Abdomen:  Soft, nontender, bowel sounds present  Extremities: 3+ peripheral edema.  Neurologic: Awake, alert, follows commands  Skin: No lesions  Access: AVF    Basic Metabolic Panel: Recent Labs  Lab 02/17/18 1553 02/17/18 1700 02/17/18 2141 02/18/18 0434 02/18/18 1521 02/19/18 0810  NA 132* 136 137 136  --  137  K 7.5* 6.0* 4.7 5.4*  --  4.1  CL 97* 99 98 98  --  100  CO2 18* 21* 27 27  --  31  GLUCOSE 145* 108* 63* 67*  --  116*  BUN 63* 57* 31* 35*  --  16  CREATININE 10.68* 10.23* 6.61* 7.22*  --  4.45*  CALCIUM 9.6 7.9* 8.0* 7.9*  --  7.5*  PHOS  --  10.0*  --   --  5.1*  --     Liver Function Tests: Recent Labs  Lab 02/17/18 1553  AST 31  ALT 21  ALKPHOS 60  BILITOT 1.0  PROT 6.6  ALBUMIN 3.3*   No results for input(s): LIPASE, AMYLASE in the last 168 hours. No results for input(s): AMMONIA in the last 168 hours.  CBC: Recent Labs  Lab 02/17/18 1528 02/18/18 0434 02/19/18 0810  WBC  12.1* 8.8 6.3  NEUTROABS 10.9*  --   --   HGB 9.0* 7.0* 6.6*  HCT 28.7* 21.7* 20.8*  MCV 90.8 88.2 89.7  PLT 311 255 238    Cardiac Enzymes: Recent Labs  Lab 02/17/18 1553  TROPONINI 0.03*    BNP: Invalid input(s): POCBNP  CBG: Recent Labs  Lab 02/18/18 2012  GLUCAP 92    Microbiology: Results for orders placed or performed during the hospital encounter of 02/17/18  Blood culture (routine x 2)     Status: None (Preliminary result)   Collection Time: 02/17/18  3:53 PM  Result Value Ref Range Status   Specimen Description BLOOD RIGHT ANTECUBITAL  Final   Special Requests   Final    BOTTLES DRAWN AEROBIC AND ANAEROBIC Blood Culture results may not be optimal due to an excessive volume of blood received in culture bottles   Culture   Final    NO GROWTH 2 DAYS Performed at [facility], [address], [place], [place] [address]    Report Status PENDING  Incomplete  Blood culture (routine x 2)     Status: Abnormal   Collection Time: 02/17/18  9:41 PM  Result Value Ref Range Status   Specimen Description   Final  BLOOD RIGHT ANTECUBITAL Performed at [facility], [address] [place], [place] [address]    Special Requests   Final    BOTTLES DRAWN AEROBIC AND ANAEROBIC Blood Culture results may not be optimal due to an excessive volume of blood received in culture bottles Performed at [facility], [address], [place], [place] [address]    Culture  Setup Time   Final    GRAM POSITIVE COCCI AEROBIC BOTTLE ONLY CRITICAL RESULT CALLED TO, READ BACK BY AND VERIFIED WITH: KISHAN PATEL @ 3428 ON 02/18/2018 BY [facility] ANAEROBIC BOTTLE ONLY GRAM POSITIVE RODS CRITICAL RESULT CALLED TO, READ BACK BY AND VERIFIED WITH: MATT MCBANE AT [place] ON 02/19/2018 JJB Performed at [facility], [address] [place], [place] [address]    Culture (A)  Final    STAPHYLOCOCCUS SPECIES (COAGULASE NEGATIVE) THE SIGNIFICANCE OF ISOLATING THIS  ORGANISM FROM A SINGLE SET OF BLOOD CULTURES WHEN MULTIPLE SETS ARE DRAWN IS UNCERTAIN. PLEASE NOTIFY THE MICROBIOLOGY DEPARTMENT WITHIN ONE WEEK IF SPECIATION AND SENSITIVITIES ARE REQUIRED. Performed at [facility], [place] [address], [place], [place] [address]    Report Status 02/19/2018 FINAL  Final  Blood Culture ID Panel (Reflexed)     Status: Abnormal   Collection Time: 02/17/18  9:41 PM  Result Value Ref Range Status   Enterococcus species NOT DETECTED NOT DETECTED Final   Listeria monocytogenes NOT DETECTED NOT DETECTED Final   Staphylococcus species DETECTED (A) NOT DETECTED Final    Comment: Methicillin (oxacillin) resistant coagulase negative staphylococcus. Possible blood culture contaminant (unless isolated from more than one blood culture draw or clinical case suggests pathogenicity). No antibiotic treatment is indicated for blood  culture contaminants. CRITICAL RESULT CALLED TO, READ BACK BY AND VERIFIED WITH: KISHAN PATEL @ 0355 ON 02/18/2018 BY [facility]    Staphylococcus aureus (BCID) NOT DETECTED NOT DETECTED Final   Methicillin resistance DETECTED (A) NOT DETECTED Final    Comment: CRITICAL RESULT CALLED TO, READ BACK BY AND VERIFIED WITH: KISHAN PATEL @ 9741 ON 02/18/2018 BY [facility]    Streptococcus species NOT DETECTED NOT DETECTED Final   Streptococcus agalactiae NOT DETECTED NOT DETECTED Final   Streptococcus pneumoniae NOT DETECTED NOT DETECTED Final   Streptococcus pyogenes NOT DETECTED NOT DETECTED Final   Acinetobacter baumannii NOT DETECTED NOT DETECTED Final   Enterobacteriaceae species NOT DETECTED NOT DETECTED Final   Enterobacter cloacae complex NOT DETECTED NOT DETECTED Final   Escherichia coli NOT DETECTED NOT DETECTED Final   Klebsiella oxytoca NOT DETECTED NOT DETECTED Final   Klebsiella pneumoniae NOT DETECTED NOT DETECTED Final   Proteus species NOT DETECTED NOT DETECTED Final   Serratia marcescens NOT DETECTED NOT DETECTED Final   Haemophilus influenzae  NOT DETECTED NOT DETECTED Final   Neisseria meningitidis NOT DETECTED NOT DETECTED Final   Pseudomonas aeruginosa NOT DETECTED NOT DETECTED Final   Candida albicans NOT DETECTED NOT DETECTED Final   Candida glabrata NOT DETECTED NOT DETECTED Final   Candida krusei NOT DETECTED NOT DETECTED Final   Candida parapsilosis NOT DETECTED NOT DETECTED Final   Candida tropicalis NOT DETECTED NOT DETECTED Final    Comment: Performed at [facility], [address] [place], [place] [address]  MRSA PCR Screening     Status: None   Collection Time: 02/18/18  1:11 AM  Result Value Ref Range Status   MRSA by PCR NEGATIVE NEGATIVE Final    Comment:        The GeneXpert MRSA  Assay (FDA approved for NASAL specimens only), is one component of a comprehensive MRSA colonization surveillance program. It is not intended to diagnose MRSA infection nor to guide or monitor treatment for MRSA infections. Performed at [facility], [address] [place], [place] [address]     Coagulation Studies: No results for input(s): LABPROT, INR in the last 72 hours.  Urinalysis: No results for input(s): COLORURINE, LABSPEC, PHURINE, GLUCOSEU, HGBUR, BILIRUBINUR, KETONESUR, PROTEINUR, UROBILINOGEN, NITRITE, LEUKOCYTESUR in the last 72 hours.  Invalid input(s): APPERANCEUR    Imaging: Dg Chest 1 View  Result Date: 02/17/2018 CLINICAL DATA:  Missed dialysis for 1 week.  Short of breath. EXAM: CHEST  1 VIEW COMPARISON:  01/02/2018 . FINDINGS: the heart size is enlarged. There is aortic atherosclerosis. Moderate right pleural effusion and smaller left pleural effusion identified. Moderate diffuse pulmonary edema identified. Nodular density projecting over the left upper lobe is indeterminate. Not confidently identified on previous imaging. IMPRESSION: 1. Bilateral pleural effusions and moderate pulmonary edema compatible with fluid overload state consistent with missing dialysis. 2. Indeterminate  nodular density in the left upper lobe. Not confidently identified on previous imaging. Recommend follow-up imaging after dialysis and resolution of pulmonary symptoms with upright PA and lateral chest radiograph to reassess. If persistent and suspicious a CT of the chest may be obtained. Electronically Signed   By: Kerby Moors M.D.   On: 02/17/2018 16:13     Medications:   . furosemide     . amLODipine  5 mg Oral Daily  . aspirin EC  162 mg Oral QHS  . Chlorhexidine Gluconate Cloth  6 each Topical Q0600  . cloNIDine  0.2 mg Oral BID  . [START ON 02/21/2018] epoetin (EPOGEN/PROCRIT) injection  10,000 Units Intravenous Q T,Th,Sa-HD  . gabapentin  100 mg Oral TID  . heparin  5,000 Units Subcutaneous Q8H  . hydrALAZINE  25 mg Oral TID  . isosorbide mononitrate  30 mg Oral Daily  . [START ON 02/20/2018] lidocaine-prilocaine   Topical Once per day on Mon Wed Fri  . nystatin   Topical TID   acetaminophen **OR** acetaminophen, alteplase, heparin, lidocaine (PF), lidocaine-prilocaine, ondansetron **OR** ondansetron (ZOFRAN) IV, pentafluoroprop-tetrafluoroeth  Assessment/ Plan:  58 y.o. female with end-stage renal disease, history of noncompliance, currently hemodialysis, diabetes, GERD, COPD, peripheral vascular disease   [facility]/left arm AV fistula  1.  ESRD on HD TTS.  Patient with documented dialysis nonadherence of both peritoneal dialysis and hemodialysis.  Missed dialysis for 1 week.   -Patient still significantly volume overloaded however no urgent indication for dialysis today.  Consider another dialysis session tomorrow as well as on Tuesday.  2.  Severe hyperkalemia.  Potassium improved and down to 4.1 with dialysis.  Reevaluate as an outpatient as well.  3.  Anemia of chronic kidney disease.  Hemoglobin down to 6.6.  Patient to be administered blood transfusion today.  4.  Secondary hyperparathyroidism.  Phosphorus now down to 5.1.  Continue to monitor as an  outpatient.   LOS: 2 Gabriellah Rabel 2/9/20202:14 PM

## 2018-02-19 NOTE — Progress Notes (Addendum)
[facility] NAME: Jacqueline Rodriguez    MR#:  [id number]  DATE OF BIRTH:  [birth year]  SUBJECTIVE:   Patient off of O2 wants to go home but hgb low this am Denies SOB, chest pain, dizziness  REVIEW OF SYSTEMS:    Review of Systems  Constitutional: Negative for fever, chills weight loss HENT: Negative for ear pain, nosebleeds, congestion, facial swelling, rhinorrhea, neck pain, neck stiffness and ear discharge.   Respiratory: Negative for cough,shortness of breath, no wheezing  Cardiovascular: Negative for chest pain, palpitations and ++leg swelling.  Gastrointestinal: Negative for heartburn, abdominal pain, vomiting, diarrhea or consitpation Genitourinary: Negative for dysuria, urgency, frequency, hematuria Musculoskeletal: Negative for back pain or joint pain Neurological: Negative for dizziness, seizures, syncope, focal weakness,  numbness and headaches.  Hematological: Does not bruise/bleed easily.  Psychiatric/Behavioral: Negative for hallucinations, confusion, dysphoric mood    Tolerating Diet: yes      DRUG ALLERGIES:   Allergies  Allergen Reactions  . Ambien [Zolpidem] Other (See Comments)    hallucinations    VITALS:  Blood pressure 130/65, pulse 75, temperature (!) 97.3 F (36.3 C), temperature source Oral, resp. rate 18, height 5\' 9"  (1.753 m), weight 93.9 kg, last menstrual period 09/18/2013, SpO2 91 %.  PHYSICAL EXAMINATION:  Constitutional: Appears well-developed and well-nourished. No distress. HENT: Normocephalic. [name] Oropharynx is clear and moist.  Eyes: Conjunctivae and EOM are normal. PERRLA, no scleral icterus.  Neck: Normal ROM. Neck supple. No JVD. No tracheal deviation. CVS: RRR, S1/S2 +, no murmurs, no gallops, no carotid bruit.  Pulmonary: Effort and breath sounds normal, no stridor, rhonchi, wheezes, rales.  Abdominal: Soft. BS +,  no distension, tenderness, rebound or guarding.  Musculoskeletal:  Normal range of motion. 2+ LEE and no tenderness.  Neuro: Alert. CN 2-12 grossly intact. No focal deficits. Skin: Skin is warm and dry. No rash noted. Psychiatric: Normal mood and affect.      LABORATORY PANEL:   CBC Recent Labs  Lab 02/19/18 0810  WBC 6.3  HGB 6.6*  HCT 20.8*  PLT 238   ------------------------------------------------------------------------------------------------------------------  Chemistries  Recent Labs  Lab 02/17/18 1553  02/19/18 0810  NA 132*   < > 137  K 7.5*   < > 4.1  CL 97*   < > 100  CO2 18*   < > 31  GLUCOSE 145*   < > 116*  BUN 63*   < > 16  CREATININE 10.68*   < > 4.45*  CALCIUM 9.6   < > 7.5*  AST 31  --   --   ALT 21  --   --   ALKPHOS 60  --   --   BILITOT 1.0  --   --    < > = values in this interval not displayed.   ------------------------------------------------------------------------------------------------------------------  Cardiac Enzymes Recent Labs  Lab 02/17/18 1553  TROPONINI 0.03*   ------------------------------------------------------------------------------------------------------------------  RADIOLOGY:  Dg Chest 1 View  Result Date: 02/17/2018 CLINICAL DATA:  Missed dialysis for 1 week.  Short of breath. EXAM: CHEST  1 VIEW COMPARISON:  01/02/2018 . FINDINGS: the heart size is enlarged. There is aortic atherosclerosis. Moderate right pleural effusion and smaller left pleural effusion identified. Moderate diffuse pulmonary edema identified. Nodular density projecting over the left upper lobe is indeterminate. Not confidently identified on previous imaging. IMPRESSION: 1. Bilateral pleural effusions and moderate pulmonary edema compatible with fluid overload state consistent with missing dialysis. 2. Indeterminate nodular  density in the left upper lobe. Not confidently identified on previous imaging. Recommend follow-up imaging after dialysis and resolution of pulmonary symptoms with upright PA and lateral chest  radiograph to reassess. If persistent and suspicious a CT of the chest may be obtained. Electronically Signed   By: Kerby Moors M.D.   On: 02/17/2018 16:13     ASSESSMENT AND PLAN:   58 year old female with end-stage renal disease on hemodialysis, peripheral vascular disease and chronic diastolic heart failure who presented to the emergency room due to confusion.   1.  Acute metabolic encephalopathy in the setting of hypoxic respiratory failure: Patient's mental status is at baseline  2.  Acute hypoxic respiratory failure in the setting of missing dialysis for 1 week:  Patient has been titrated off of oxygen and BiPAP.    3.  Acute on chronic anemia: Patient has consented for  blood transfusion. 1 unit to be given over 4 hours. Anemia panel ordered   4.  Chronic diastolic heart failure with preserved ejection fraction: Etiology of pulmonary edema is due to missing dialysis  5  Hyperkalemia: Potassium level has normalized after dialysis.  6.  End-stage renal disease on hemodialysis: Dialysis as per her routine schedule Tuesday, Thursday and Saturday  7.  Staph species 1 out of 2 bottles: Looks like a contaminant.  Repeat blood cultures have been ordered.    PT consult for discharge planning Management plans discussed with the patient and she is in agreement.  CODE STATUS: full  TOTAL TIME TAKING CARE OF THIS PATIENT: 32 minutes.   D/w dr Holley Raring  POSSIBLE D/C tomorrow, DEPENDING ON CLINICAL CONDITION.   Leigha Olberding M.D on 02/19/2018 at 10:33 AM  Between 7am to 6pm - Pager - [phone] After 6pm go to [email or web address] - password [email or web address] [facility]  Office  [phone]  CC: Primary care physician; Tracie Harrier, MD  Note: This dictation was prepared with Dragon dictation along with smaller phrase technology. Any transcriptional errors that result from this process are unintentional.

## 2018-02-19 NOTE — Progress Notes (Signed)
Patient is being D/c home with husband. Patient was cleaned up before leaving and patient was not in any distress. IV removed before d/c. Catheter intact site clean and dry no bleeding. Patient wheeled in wheelchair to family.

## 2018-02-19 NOTE — Progress Notes (Signed)
PHARMACY - PHYSICIAN COMMUNICATION CRITICAL VALUE ALERT - BLOOD CULTURE IDENTIFICATION (BCID)  Jacqueline Rodriguez is an 58 y.o. female who presented to [facility] on 02/17/2018 with a chief complaint of   Assessment:  GPR, Staph spp, mecA(+) (include suspected source if known)  Name of physician (or Provider) Contacted: Marcille Blanco  Current antibiotics: n/a  Changes to prescribed antibiotics recommended:  n/a  Results for orders placed or performed during the hospital encounter of 02/17/18  Blood Culture ID Panel (Reflexed) (Collected: 02/17/2018  9:41 PM)  Result Value Ref Range   Enterococcus species NOT DETECTED NOT DETECTED   Listeria monocytogenes NOT DETECTED NOT DETECTED   Staphylococcus species DETECTED (A) NOT DETECTED   Staphylococcus aureus (BCID) NOT DETECTED NOT DETECTED   Methicillin resistance DETECTED (A) NOT DETECTED   Streptococcus species NOT DETECTED NOT DETECTED   Streptococcus agalactiae NOT DETECTED NOT DETECTED   Streptococcus pneumoniae NOT DETECTED NOT DETECTED   Streptococcus pyogenes NOT DETECTED NOT DETECTED   Acinetobacter baumannii NOT DETECTED NOT DETECTED   Enterobacteriaceae species NOT DETECTED NOT DETECTED   Enterobacter cloacae complex NOT DETECTED NOT DETECTED   Escherichia coli NOT DETECTED NOT DETECTED   Klebsiella oxytoca NOT DETECTED NOT DETECTED   Klebsiella pneumoniae NOT DETECTED NOT DETECTED   Proteus species NOT DETECTED NOT DETECTED   Serratia marcescens NOT DETECTED NOT DETECTED   Haemophilus influenzae NOT DETECTED NOT DETECTED   Neisseria meningitidis NOT DETECTED NOT DETECTED   Pseudomonas aeruginosa NOT DETECTED NOT DETECTED   Candida albicans NOT DETECTED NOT DETECTED   Candida glabrata NOT DETECTED NOT DETECTED   Candida krusei NOT DETECTED NOT DETECTED   Candida parapsilosis NOT DETECTED NOT DETECTED   Candida tropicalis NOT DETECTED NOT DETECTED    Nathan Moctezuma S 02/19/2018  5:59 AM

## 2018-02-19 NOTE — Plan of Care (Signed)
  Problem: Clinical Measurements: Goal: Will remain free from infection Outcome: Progressing Goal: Respiratory complications will improve Outcome: Progressing   Problem: Coping: Goal: Level of anxiety will decrease Outcome: Progressing   Problem: Pain Managment: Goal: General experience of comfort will improve Outcome: Progressing   Problem: Skin Integrity: Goal: Risk for impaired skin integrity will decrease Outcome: Progressing

## 2018-02-19 NOTE — Discharge Summary (Signed)
Jacqueline Rodriguez at [place] NAME: Jacqueline Rodriguez    MR#:  [id number]  DATE OF BIRTH:  November 01, 1960  DATE OF ADMISSION:  02/17/2018 ADMITTING PHYSICIAN: Gladstone Lighter, MD  DATE OF DISCHARGE: 02/19/2018  PRIMARY CARE PHYSICIAN: Tracie Harrier, MD    ADMISSION DIAGNOSIS:  Hyperkalemia [E87.5] Acute pulmonary edema ([place]) [J81.0] Hypoxia [R09.02] Altered mental status, unspecified altered mental status type [R41.82]  DISCHARGE DIAGNOSIS:  Active Problems:   Hyperkalemia   SECONDARY DIAGNOSIS:   Past Medical History:  Diagnosis Date  . Anemia   . Chronic bronchitis ([place])   . Chronic diastolic CHF (congestive heart failure) ([place])   . CKD (chronic kidney disease), stage III ([place])   . COPD (chronic obstructive pulmonary disease) ([place])   . Diabetes mellitus with renal complications ([place])   . ESRD (end stage renal disease) ([place])   . Essential hypertension   . GERD (gastroesophageal reflux disease)   . Obesity   . Peripheral vascular disease ([place])   . Pulmonary hypertension ([place])   . Renal insufficiency 09/19/2015   Stage 3 CKD. Beginning dialysis.    HOSPITAL COURSE:  58 year old female with end-stage renal disease on hemodialysis, peripheral vascular disease and chronic diastolic heart failure who presented to the emergency room due to confusion.   1.  Acute metabolic encephalopathy in the setting of hypoxic respiratory failure: Patient's mental status is at baseline  2.  Acute hypoxic respiratory failure in the setting of missing dialysis for 1 week:  Patient has been titrated off of oxygen and BiPAP.    3.  Acute on chronic anemia: Patient has consented for  blood transfusion. 1 unit was  given over 4 hours. Anemia panel was ordered. Patient wanted to go home so we ask PCP to follow up on the final results.  4.  Chronic diastolic heart failure with preserved ejection fraction: Etiology of pulmonary edema is due to missing  dialysis. CHF clinic at discharge was requested.  5  Hyperkalemia: Potassium level has normalized after dialysis.  6.  End-stage renal disease on hemodialysis: Dialysis as per her routine schedule Tuesday, Thursday and Saturday  7.  Staph species 1 out of 2 bottles:This is  a contaminant.  Repeat blood cultures have been negative.   DISCHARGE CONDITIONS AND DIET:  Stable Renal diet  CONSULTS OBTAINED:  Treatment Team:  Anthonette Legato, MD  DRUG ALLERGIES:   Allergies  Allergen Reactions  . Ambien [Zolpidem] Other (See Comments)    hallucinations    DISCHARGE MEDICATIONS:   Allergies as of 02/19/2018      Reactions   Ambien [zolpidem] Other (See Comments)   hallucinations      Medication List    STOP taking these medications   lisinopril 10 MG tablet Commonly known as:  PRINIVIL,ZESTRIL   ondansetron 4 MG tablet Commonly known as:  ZOFRAN   sevelamer carbonate 800 MG tablet Commonly known as:  RENVELA     TAKE these medications   acetaminophen 500 MG tablet Commonly known as:  TYLENOL Take 500 mg by mouth every 6 (six) hours as needed.   albuterol 108 (90 Base) MCG/ACT inhaler Commonly known as:  PROVENTIL HFA;VENTOLIN HFA Inhale 2 puffs into the lungs every 6 (six) hours as needed for wheezing or shortness of breath.   amLODipine 5 MG tablet Commonly known as:  NORVASC Take 5 mg by mouth daily.   aspirin 81 MG EC tablet Take 2 tablets (162 mg total) by mouth at bedtime.  cetirizine 10 MG tablet Commonly known as:  ZYRTEC Take 10 mg by mouth daily as needed for allergies.   cloNIDine 0.2 MG tablet Commonly known as:  CATAPRES Take 0.2 mg by mouth 2 (two) times daily.   docusate sodium 100 MG capsule Commonly known as:  COLACE Take 100 mg by mouth 2 (two) times daily.   furosemide 40 MG tablet Commonly known as:  LASIX Take 40 mg by mouth daily.   gabapentin 100 MG capsule Commonly known as:  NEURONTIN Take 100 mg by mouth 3 (three)  times daily as needed. For neuropathy. (patient usually only takes one capsule a day)   GLUCOSAMINE-CHONDROITIN PO Take 4,000 mg by mouth daily.   hydrALAZINE 25 MG tablet Commonly known as:  APRESOLINE Take 25 mg by mouth 3 (three) times daily.   isosorbide mononitrate 30 MG 24 hr tablet Commonly known as:  IMDUR Take 1 tablet by mouth daily.   lactulose 10 GM/15ML solution Commonly known as:  CHRONULAC Take 30 mLs by mouth every other day.   lidocaine-prilocaine cream Commonly known as:  EMLA Apply topically 3 (three) times a week. Apply 45 minutes prior to dialysis treatment three times a week   meclizine 25 MG tablet Commonly known as:  ANTIVERT Take 1 tablet (25 mg total) by mouth 3 (three) times daily as needed for dizziness. What changed:  when to take this         Today   CHIEF COMPLAINT:  Wants to go home has urgent issues to take care of denies SOB, melena   VITAL SIGNS:  Blood pressure 101/62, pulse 79, temperature 98 F (36.7 C), temperature source Oral, resp. rate 18, height 5\' 9"  (1.753 m), weight 93.9 kg, last menstrual period 09/18/2013, SpO2 100 %.   REVIEW OF SYSTEMS:  Review of Systems  Constitutional: Negative.  Negative for chills, fever and malaise/fatigue.  HENT: Negative.  Negative for ear discharge, ear pain, hearing loss, nosebleeds and sore throat.   Eyes: Negative.  Negative for blurred vision and pain.  Respiratory: Negative.  Negative for cough, hemoptysis, shortness of breath and wheezing.   Cardiovascular: Negative.  Negative for chest pain, palpitations and leg swelling.  Gastrointestinal: Negative.  Negative for abdominal pain, blood in stool, diarrhea, nausea and vomiting.  Genitourinary: Negative.  Negative for dysuria.  Musculoskeletal: Negative.  Negative for back pain.  Skin: Negative.   Neurological: Negative for dizziness, tremors, speech change, focal weakness, seizures and headaches.  Endo/Heme/Allergies: Negative.   Does not bruise/bleed easily.  Psychiatric/Behavioral: Negative.  Negative for depression, hallucinations and suicidal ideas.     PHYSICAL EXAMINATION:  GENERAL:  58 y.o.-year-old patient lying in the bed with no acute distress.  NECK:  Supple, no jugular venous distention. No thyroid enlargement, no tenderness.  LUNGS: Normal breath sounds bilaterally, no wheezing, rales,rhonchi  No use of accessory muscles of respiration.  CARDIOVASCULAR: S1, S2 normal. No murmurs, rubs, or gallops.  ABDOMEN: Soft, non-tender, non-distended. Bowel sounds present. No organomegaly or mass.  EXTREMITIES: No pedal edema, cyanosis, or clubbing.  PSYCHIATRIC: The patient is alert and oriented x 3.  SKIN: No obvious rash, lesion, or ulcer.   DATA REVIEW:   CBC Recent Labs  Lab 02/19/18 0810  WBC 6.3  HGB 6.6*  HCT 20.8*  PLT 238    Chemistries  Recent Labs  Lab 02/17/18 1553  02/19/18 0810  NA 132*   < > 137  K 7.5*   < > 4.1  CL 97*   < >  100  CO2 18*   < > 31  GLUCOSE 145*   < > 116*  BUN 63*   < > 16  CREATININE 10.68*   < > 4.45*  CALCIUM 9.6   < > 7.5*  AST 31  --   --   ALT 21  --   --   ALKPHOS 60  --   --   BILITOT 1.0  --   --    < > = values in this interval not displayed.    Cardiac Enzymes Recent Labs  Lab 02/17/18 1553  TROPONINI 0.03*    Microbiology Results  @MICRORSLT48 @  RADIOLOGY:  No results found.    Allergies as of 02/19/2018      Reactions   Ambien [zolpidem] Other (See Comments)   hallucinations      Medication List    STOP taking these medications   lisinopril 10 MG tablet Commonly known as:  PRINIVIL,ZESTRIL   ondansetron 4 MG tablet Commonly known as:  ZOFRAN   sevelamer carbonate 800 MG tablet Commonly known as:  RENVELA     TAKE these medications   acetaminophen 500 MG tablet Commonly known as:  TYLENOL Take 500 mg by mouth every 6 (six) hours as needed.   albuterol 108 (90 Base) MCG/ACT inhaler Commonly known as:  PROVENTIL  HFA;VENTOLIN HFA Inhale 2 puffs into the lungs every 6 (six) hours as needed for wheezing or shortness of breath.   amLODipine 5 MG tablet Commonly known as:  NORVASC Take 5 mg by mouth daily.   aspirin 81 MG EC tablet Take 2 tablets (162 mg total) by mouth at bedtime.   cetirizine 10 MG tablet Commonly known as:  ZYRTEC Take 10 mg by mouth daily as needed for allergies.   cloNIDine 0.2 MG tablet Commonly known as:  CATAPRES Take 0.2 mg by mouth 2 (two) times daily.   docusate sodium 100 MG capsule Commonly known as:  COLACE Take 100 mg by mouth 2 (two) times daily.   furosemide 40 MG tablet Commonly known as:  LASIX Take 40 mg by mouth daily.   gabapentin 100 MG capsule Commonly known as:  NEURONTIN Take 100 mg by mouth 3 (three) times daily as needed. For neuropathy. (patient usually only takes one capsule a day)   GLUCOSAMINE-CHONDROITIN PO Take 4,000 mg by mouth daily.   hydrALAZINE 25 MG tablet Commonly known as:  APRESOLINE Take 25 mg by mouth 3 (three) times daily.   isosorbide mononitrate 30 MG 24 hr tablet Commonly known as:  IMDUR Take 1 tablet by mouth daily.   lactulose 10 GM/15ML solution Commonly known as:  CHRONULAC Take 30 mLs by mouth every other day.   lidocaine-prilocaine cream Commonly known as:  EMLA Apply topically 3 (three) times a week. Apply 45 minutes prior to dialysis treatment three times a week   meclizine 25 MG tablet Commonly known as:  ANTIVERT Take 1 tablet (25 mg total) by mouth 3 (three) times daily as needed for dizziness. What changed:  when to take this          Management plans discussed with the patient and she is in agreement. Stable for discharge home  Patient should follow up with pcp  CODE STATUS:     Code Status Orders  (From admission, onward)         Start     Ordered   02/18/18 0006  Full code  Continuous     02/18/18 0005  Code Status History    Date Active Date Inactive Code Status  Order ID Comments User Context   12/28/2017 2322 01/02/2018 2102 Full Code [id number]  Sedalia Muta, MD ED   12/21/2017 1215 12/23/2017 0930 Full Code [id number]  Nicholes Mango, MD Inpatient   09/07/2017 1813 09/10/2017 2239 Full Code [id number]  Henreitta Leber, MD Inpatient   12/16/2016 0814 12/17/2016 1438 Full Code [id number]  Saundra Shelling, MD Inpatient   12/03/2016 1201 12/04/2016 [place] Full Code [id number]  Max Sane, MD Inpatient   09/13/2016 1629 09/17/2016 1817 Full Code [id number]  Epifanio Lesches, MD ED   09/08/2016 1631 09/09/2016 1823 Full Code [id number]  Fritzi Mandes, MD Inpatient   07/17/2015 1244 07/24/2015 1911 Full Code [id number]  Epifanio Lesches, MD ED   03/15/2015 1956 03/20/2015 1548 Full Code [id number]  Idelle Crouch, MD Inpatient    Advance Directive Documentation     Most Recent Value  Type of Advance Directive  Healthcare Power of Attorney  Pre-existing out of facility DNR order (yellow form or pink MOST form)  -  "MOST" Form in Place?  -      TOTAL TIME TAKING CARE OF THIS PATIENT: 38 minutes.    Note: This dictation was prepared with Dragon dictation along with smaller phrase technology. Any transcriptional errors that result from this process are unintentional.  Demar Shad M.D on 02/19/2018 at 5:43 PM  Between 7am to 6pm - Pager - [phone] After 6pm go to [email or web address] - password [email or web address]  Office  [phone]  CC: Primary care physician; Tracie Harrier, MD

## 2018-02-20 DIAGNOSIS — D631 Anemia in chronic kidney disease: Secondary | ICD-10-CM | POA: Diagnosis not present

## 2018-02-20 DIAGNOSIS — N186 End stage renal disease: Secondary | ICD-10-CM | POA: Diagnosis not present

## 2018-02-20 DIAGNOSIS — D509 Iron deficiency anemia, unspecified: Secondary | ICD-10-CM | POA: Diagnosis not present

## 2018-02-20 DIAGNOSIS — N2581 Secondary hyperparathyroidism of renal origin: Secondary | ICD-10-CM | POA: Diagnosis not present

## 2018-02-20 DIAGNOSIS — Z992 Dependence on renal dialysis: Secondary | ICD-10-CM | POA: Diagnosis not present

## 2018-02-20 LAB — TYPE AND SCREEN
ABO/RH(D): A NEG
Antibody Screen: NEGATIVE
Unit division: 0

## 2018-02-20 LAB — VITAMIN B12: Vitamin B-12: 646 pg/mL (ref 180–914)

## 2018-02-20 LAB — BPAM RBC
Blood Product Expiration Date: 202002192359
ISSUE DATE / TIME: 202002091432
UNIT TYPE AND RH: 600

## 2018-02-22 LAB — CULTURE, BLOOD (ROUTINE X 2): Culture: NO GROWTH

## 2018-02-23 DIAGNOSIS — D631 Anemia in chronic kidney disease: Secondary | ICD-10-CM | POA: Diagnosis not present

## 2018-02-23 DIAGNOSIS — N2581 Secondary hyperparathyroidism of renal origin: Secondary | ICD-10-CM | POA: Diagnosis not present

## 2018-02-23 DIAGNOSIS — Z992 Dependence on renal dialysis: Secondary | ICD-10-CM | POA: Diagnosis not present

## 2018-02-23 DIAGNOSIS — D509 Iron deficiency anemia, unspecified: Secondary | ICD-10-CM | POA: Diagnosis not present

## 2018-02-23 DIAGNOSIS — N186 End stage renal disease: Secondary | ICD-10-CM | POA: Diagnosis not present

## 2018-02-23 LAB — CULTURE, BLOOD (ROUTINE X 2)

## 2018-02-24 LAB — CULTURE, BLOOD (ROUTINE X 2)
CULTURE: NO GROWTH
Culture: NO GROWTH
SPECIAL REQUESTS: ADEQUATE
Special Requests: ADEQUATE

## 2018-02-26 NOTE — Progress Notes (Deleted)
   Patient ID: Jacqueline Rodriguez, female    DOB: 17-Jul-1960, 58 y.o.   MRN: [id number]  HPI  Jacqueline Rodriguez is a 58 y/o female with a history of  Echo report from 12/03/16 reviewed and showed an EF of 65-70%.   Admitted 02/17/2018 due to acute metabolic encephalopathy in the setting of hypoxic respiratory failure after missing dialysis for one week. Initially needed bipap and oxygen but was able to be weaned off both of them. Given 1 unit of PRBC's due to anemia. Discharged after 2 days. Admitted 12/28/17 due to severe hypoglycemia. Discharged after 5 days.     She presents today for a follow-up visit although hasn't been seen since July 2017. She presents with a chief complaint of   Review of Systems    Physical Exam    Assessment & Plan:  1: Chronic heart failure with preserved ejection fraction- - NYHA class - BNP 02/17/2018 was 1750.0  2; HTN- - BP - BMP from 02/17/2018 reviewed and showed sodium 137, potassium 4.1, creatinine 4.45 and GFR 10  3: DM- - A1c 12/29/17 was 5.5% - on dialysis T, Th & Sat

## 2018-02-27 ENCOUNTER — Ambulatory Visit: Payer: Medicare Other | Admitting: Family

## 2018-02-27 ENCOUNTER — Telehealth: Payer: Self-pay | Admitting: Family

## 2018-02-27 NOTE — Telephone Encounter (Signed)
Patient did not show for her Heart Failure Clinic appointment on 02/27/2018. Will attempt to reschedule.

## 2018-02-28 DIAGNOSIS — N186 End stage renal disease: Secondary | ICD-10-CM | POA: Diagnosis not present

## 2018-02-28 DIAGNOSIS — N2581 Secondary hyperparathyroidism of renal origin: Secondary | ICD-10-CM | POA: Diagnosis not present

## 2018-02-28 DIAGNOSIS — D631 Anemia in chronic kidney disease: Secondary | ICD-10-CM | POA: Diagnosis not present

## 2018-02-28 DIAGNOSIS — D509 Iron deficiency anemia, unspecified: Secondary | ICD-10-CM | POA: Diagnosis not present

## 2018-02-28 DIAGNOSIS — Z992 Dependence on renal dialysis: Secondary | ICD-10-CM | POA: Diagnosis not present

## 2018-03-04 DIAGNOSIS — D509 Iron deficiency anemia, unspecified: Secondary | ICD-10-CM | POA: Diagnosis not present

## 2018-03-04 DIAGNOSIS — D631 Anemia in chronic kidney disease: Secondary | ICD-10-CM | POA: Diagnosis not present

## 2018-03-04 DIAGNOSIS — Z992 Dependence on renal dialysis: Secondary | ICD-10-CM | POA: Diagnosis not present

## 2018-03-04 DIAGNOSIS — N186 End stage renal disease: Secondary | ICD-10-CM | POA: Diagnosis not present

## 2018-03-04 DIAGNOSIS — N2581 Secondary hyperparathyroidism of renal origin: Secondary | ICD-10-CM | POA: Diagnosis not present

## 2018-03-11 DIAGNOSIS — N186 End stage renal disease: Secondary | ICD-10-CM | POA: Diagnosis not present

## 2018-03-11 DIAGNOSIS — N2581 Secondary hyperparathyroidism of renal origin: Secondary | ICD-10-CM | POA: Diagnosis not present

## 2018-03-11 DIAGNOSIS — D631 Anemia in chronic kidney disease: Secondary | ICD-10-CM | POA: Diagnosis not present

## 2018-03-11 DIAGNOSIS — Z992 Dependence on renal dialysis: Secondary | ICD-10-CM | POA: Diagnosis not present

## 2018-03-11 DIAGNOSIS — D509 Iron deficiency anemia, unspecified: Secondary | ICD-10-CM | POA: Diagnosis not present

## 2018-03-16 DIAGNOSIS — D631 Anemia in chronic kidney disease: Secondary | ICD-10-CM | POA: Diagnosis not present

## 2018-03-16 DIAGNOSIS — Z992 Dependence on renal dialysis: Secondary | ICD-10-CM | POA: Diagnosis not present

## 2018-03-16 DIAGNOSIS — N2581 Secondary hyperparathyroidism of renal origin: Secondary | ICD-10-CM | POA: Diagnosis not present

## 2018-03-16 DIAGNOSIS — D509 Iron deficiency anemia, unspecified: Secondary | ICD-10-CM | POA: Diagnosis not present

## 2018-03-16 DIAGNOSIS — N186 End stage renal disease: Secondary | ICD-10-CM | POA: Diagnosis not present

## 2018-03-23 DIAGNOSIS — Z992 Dependence on renal dialysis: Secondary | ICD-10-CM | POA: Diagnosis not present

## 2018-03-23 DIAGNOSIS — N186 End stage renal disease: Secondary | ICD-10-CM | POA: Diagnosis not present

## 2018-03-23 DIAGNOSIS — D509 Iron deficiency anemia, unspecified: Secondary | ICD-10-CM | POA: Diagnosis not present

## 2018-03-23 DIAGNOSIS — N2581 Secondary hyperparathyroidism of renal origin: Secondary | ICD-10-CM | POA: Diagnosis not present

## 2018-03-23 DIAGNOSIS — D631 Anemia in chronic kidney disease: Secondary | ICD-10-CM | POA: Diagnosis not present

## 2018-03-26 ENCOUNTER — Inpatient Hospital Stay
Admission: EM | Admit: 2018-03-26 | Discharge: 2018-04-12 | DRG: 291 | Disposition: E | Payer: Medicare Other | Attending: Pulmonary Disease | Admitting: Pulmonary Disease

## 2018-03-26 ENCOUNTER — Other Ambulatory Visit: Payer: Self-pay

## 2018-03-26 ENCOUNTER — Emergency Department: Payer: Medicare Other

## 2018-03-26 DIAGNOSIS — N186 End stage renal disease: Secondary | ICD-10-CM | POA: Diagnosis present

## 2018-03-26 DIAGNOSIS — J9601 Acute respiratory failure with hypoxia: Secondary | ICD-10-CM | POA: Diagnosis present

## 2018-03-26 DIAGNOSIS — I120 Hypertensive chronic kidney disease with stage 5 chronic kidney disease or end stage renal disease: Secondary | ICD-10-CM | POA: Diagnosis not present

## 2018-03-26 DIAGNOSIS — E1122 Type 2 diabetes mellitus with diabetic chronic kidney disease: Secondary | ICD-10-CM | POA: Diagnosis present

## 2018-03-26 DIAGNOSIS — R578 Other shock: Secondary | ICD-10-CM | POA: Diagnosis present

## 2018-03-26 DIAGNOSIS — Z515 Encounter for palliative care: Secondary | ICD-10-CM | POA: Diagnosis present

## 2018-03-26 DIAGNOSIS — E1151 Type 2 diabetes mellitus with diabetic peripheral angiopathy without gangrene: Secondary | ICD-10-CM | POA: Diagnosis present

## 2018-03-26 DIAGNOSIS — R404 Transient alteration of awareness: Secondary | ICD-10-CM | POA: Diagnosis not present

## 2018-03-26 DIAGNOSIS — Z66 Do not resuscitate: Secondary | ICD-10-CM | POA: Diagnosis present

## 2018-03-26 DIAGNOSIS — N2581 Secondary hyperparathyroidism of renal origin: Secondary | ICD-10-CM | POA: Diagnosis present

## 2018-03-26 DIAGNOSIS — E162 Hypoglycemia, unspecified: Secondary | ICD-10-CM | POA: Diagnosis not present

## 2018-03-26 DIAGNOSIS — J90 Pleural effusion, not elsewhere classified: Secondary | ICD-10-CM | POA: Diagnosis not present

## 2018-03-26 DIAGNOSIS — J9602 Acute respiratory failure with hypercapnia: Secondary | ICD-10-CM | POA: Diagnosis present

## 2018-03-26 DIAGNOSIS — J449 Chronic obstructive pulmonary disease, unspecified: Secondary | ICD-10-CM | POA: Diagnosis present

## 2018-03-26 DIAGNOSIS — Z87891 Personal history of nicotine dependence: Secondary | ICD-10-CM

## 2018-03-26 DIAGNOSIS — Z9119 Patient's noncompliance with other medical treatment and regimen: Secondary | ICD-10-CM

## 2018-03-26 DIAGNOSIS — K219 Gastro-esophageal reflux disease without esophagitis: Secondary | ICD-10-CM | POA: Diagnosis present

## 2018-03-26 DIAGNOSIS — G9341 Metabolic encephalopathy: Secondary | ICD-10-CM | POA: Diagnosis not present

## 2018-03-26 DIAGNOSIS — T383X5A Adverse effect of insulin and oral hypoglycemic [antidiabetic] drugs, initial encounter: Secondary | ICD-10-CM | POA: Diagnosis present

## 2018-03-26 DIAGNOSIS — L89151 Pressure ulcer of sacral region, stage 1: Secondary | ICD-10-CM | POA: Diagnosis present

## 2018-03-26 DIAGNOSIS — I509 Heart failure, unspecified: Secondary | ICD-10-CM | POA: Diagnosis not present

## 2018-03-26 DIAGNOSIS — R0602 Shortness of breath: Secondary | ICD-10-CM

## 2018-03-26 DIAGNOSIS — N184 Chronic kidney disease, stage 4 (severe): Secondary | ICD-10-CM

## 2018-03-26 DIAGNOSIS — Z79899 Other long term (current) drug therapy: Secondary | ICD-10-CM

## 2018-03-26 DIAGNOSIS — R569 Unspecified convulsions: Secondary | ICD-10-CM | POA: Diagnosis present

## 2018-03-26 DIAGNOSIS — I10 Essential (primary) hypertension: Secondary | ICD-10-CM

## 2018-03-26 DIAGNOSIS — Z833 Family history of diabetes mellitus: Secondary | ICD-10-CM

## 2018-03-26 DIAGNOSIS — R0902 Hypoxemia: Secondary | ICD-10-CM | POA: Diagnosis not present

## 2018-03-26 DIAGNOSIS — I272 Pulmonary hypertension, unspecified: Secondary | ICD-10-CM | POA: Diagnosis present

## 2018-03-26 DIAGNOSIS — D631 Anemia in chronic kidney disease: Secondary | ICD-10-CM | POA: Diagnosis present

## 2018-03-26 DIAGNOSIS — I132 Hypertensive heart and chronic kidney disease with heart failure and with stage 5 chronic kidney disease, or end stage renal disease: Secondary | ICD-10-CM | POA: Diagnosis not present

## 2018-03-26 DIAGNOSIS — L97509 Non-pressure chronic ulcer of other part of unspecified foot with unspecified severity: Secondary | ICD-10-CM | POA: Diagnosis present

## 2018-03-26 DIAGNOSIS — Z6831 Body mass index (BMI) 31.0-31.9, adult: Secondary | ICD-10-CM | POA: Diagnosis not present

## 2018-03-26 DIAGNOSIS — Z992 Dependence on renal dialysis: Secondary | ICD-10-CM

## 2018-03-26 DIAGNOSIS — L8990 Pressure ulcer of unspecified site, unspecified stage: Secondary | ICD-10-CM

## 2018-03-26 DIAGNOSIS — E161 Other hypoglycemia: Secondary | ICD-10-CM | POA: Diagnosis not present

## 2018-03-26 DIAGNOSIS — R601 Generalized edema: Secondary | ICD-10-CM

## 2018-03-26 DIAGNOSIS — J9621 Acute and chronic respiratory failure with hypoxia: Secondary | ICD-10-CM | POA: Diagnosis not present

## 2018-03-26 DIAGNOSIS — I5032 Chronic diastolic (congestive) heart failure: Secondary | ICD-10-CM | POA: Diagnosis not present

## 2018-03-26 DIAGNOSIS — Z7982 Long term (current) use of aspirin: Secondary | ICD-10-CM

## 2018-03-26 DIAGNOSIS — E669 Obesity, unspecified: Secondary | ICD-10-CM | POA: Diagnosis present

## 2018-03-26 DIAGNOSIS — R0689 Other abnormalities of breathing: Secondary | ICD-10-CM | POA: Diagnosis not present

## 2018-03-26 DIAGNOSIS — R0600 Dyspnea, unspecified: Secondary | ICD-10-CM

## 2018-03-26 DIAGNOSIS — Z7984 Long term (current) use of oral hypoglycemic drugs: Secondary | ICD-10-CM

## 2018-03-26 DIAGNOSIS — J811 Chronic pulmonary edema: Secondary | ICD-10-CM

## 2018-03-26 DIAGNOSIS — Z888 Allergy status to other drugs, medicaments and biological substances status: Secondary | ICD-10-CM

## 2018-03-26 DIAGNOSIS — E11649 Type 2 diabetes mellitus with hypoglycemia without coma: Secondary | ICD-10-CM | POA: Diagnosis present

## 2018-03-26 DIAGNOSIS — E8770 Fluid overload, unspecified: Secondary | ICD-10-CM | POA: Diagnosis not present

## 2018-03-26 DIAGNOSIS — I5033 Acute on chronic diastolic (congestive) heart failure: Secondary | ICD-10-CM | POA: Diagnosis present

## 2018-03-26 DIAGNOSIS — Z8249 Family history of ischemic heart disease and other diseases of the circulatory system: Secondary | ICD-10-CM

## 2018-03-26 LAB — COMPREHENSIVE METABOLIC PANEL
ALBUMIN: 3.4 g/dL — AB (ref 3.5–5.0)
ALT: 17 U/L (ref 0–44)
AST: 27 U/L (ref 15–41)
Alkaline Phosphatase: 69 U/L (ref 38–126)
Anion gap: 16 — ABNORMAL HIGH (ref 5–15)
BILIRUBIN TOTAL: 0.7 mg/dL (ref 0.3–1.2)
BUN: 43 mg/dL — ABNORMAL HIGH (ref 6–20)
CO2: 23 mmol/L (ref 22–32)
Calcium: 8 mg/dL — ABNORMAL LOW (ref 8.9–10.3)
Chloride: 97 mmol/L — ABNORMAL LOW (ref 98–111)
Creatinine, Ser: 7.49 mg/dL — ABNORMAL HIGH (ref 0.44–1.00)
GFR calc Af Amer: 6 mL/min — ABNORMAL LOW (ref >60)
GFR calc non Af Amer: 5 mL/min — ABNORMAL LOW (ref >60)
GLUCOSE: 39 mg/dL — AB (ref 70–99)
Potassium: 5.9 mmol/L — ABNORMAL HIGH (ref 3.5–5.1)
SODIUM: 136 mmol/L (ref 135–145)
Total Protein: 7.5 g/dL (ref 6.5–8.1)

## 2018-03-26 LAB — LACTIC ACID, PLASMA: Lactic Acid, Venous: 1 mmol/L (ref 0.5–1.9)

## 2018-03-26 LAB — GLUCOSE, CAPILLARY
GLUCOSE-CAPILLARY: 95 mg/dL (ref 70–99)
Glucose-Capillary: 100 mg/dL — ABNORMAL HIGH (ref 70–99)
Glucose-Capillary: 31 mg/dL — CL (ref 70–99)
Glucose-Capillary: 43 mg/dL — CL (ref 70–99)
Glucose-Capillary: 73 mg/dL (ref 70–99)
Glucose-Capillary: 76 mg/dL (ref 70–99)

## 2018-03-26 LAB — CBC WITH DIFFERENTIAL/PLATELET
Abs Immature Granulocytes: 0.03 10*3/uL (ref 0.00–0.07)
Basophils Absolute: 0 10*3/uL (ref 0.0–0.1)
Basophils Relative: 0 %
Eosinophils Absolute: 0 10*3/uL (ref 0.0–0.5)
Eosinophils Relative: 0 %
HCT: 35.1 % — ABNORMAL LOW (ref 36.0–46.0)
Hemoglobin: 11 g/dL — ABNORMAL LOW (ref 12.0–15.0)
Immature Granulocytes: 0 %
LYMPHS ABS: 0.3 10*3/uL — AB (ref 0.7–4.0)
LYMPHS PCT: 3 %
MCH: 28.5 pg (ref 26.0–34.0)
MCHC: 31.3 g/dL (ref 30.0–36.0)
MCV: 90.9 fL (ref 80.0–100.0)
Monocytes Absolute: 0.4 10*3/uL (ref 0.1–1.0)
Monocytes Relative: 5 %
NRBC: 0 % (ref 0.0–0.2)
Neutro Abs: 7.4 10*3/uL (ref 1.7–7.7)
Neutrophils Relative %: 92 %
Platelets: 234 10*3/uL (ref 150–400)
RBC: 3.86 MIL/uL — ABNORMAL LOW (ref 3.87–5.11)
RDW: 14.6 % (ref 11.5–15.5)
WBC: 8.2 10*3/uL (ref 4.0–10.5)

## 2018-03-26 LAB — PROCALCITONIN: Procalcitonin: 0.32 ng/mL

## 2018-03-26 LAB — BRAIN NATRIURETIC PEPTIDE: B Natriuretic Peptide: 2752 pg/mL — ABNORMAL HIGH (ref 0.0–100.0)

## 2018-03-26 LAB — INFLUENZA PANEL BY PCR (TYPE A & B)
Influenza A By PCR: NEGATIVE
Influenza B By PCR: NEGATIVE

## 2018-03-26 MED ORDER — DEXTROSE 50 % IV SOLN
1.0000 | INTRAVENOUS | Status: DC | PRN
  Administered 2018-03-26 – 2018-03-27 (×4): 50 mL via INTRAVENOUS
  Filled 2018-03-26 (×4): qty 50

## 2018-03-26 MED ORDER — INSULIN ASPART 100 UNIT/ML ~~LOC~~ SOLN: 0.0000 [IU] | Freq: Three times a day (TID) | SUBCUTANEOUS | Status: DC

## 2018-03-26 MED ORDER — ISOSORBIDE MONONITRATE ER 30 MG PO TB24: 30.0000 mg | ORAL_TABLET | Freq: Every day | ORAL | Status: DC

## 2018-03-26 MED ORDER — DEXTROSE 50 % IV SOLN
INTRAVENOUS | Status: AC
  Filled 2018-03-26: qty 50

## 2018-03-26 MED ORDER — DEXTROSE 50 % IV SOLN
1.0000 | Freq: Once | INTRAVENOUS | Status: AC
  Administered 2018-03-26: 50 mL via INTRAVENOUS

## 2018-03-26 MED ORDER — MECLIZINE HCL 25 MG PO TABS
25.0000 mg | ORAL_TABLET | Freq: Three times a day (TID) | ORAL | Status: DC | PRN
  Filled 2018-03-26: qty 1

## 2018-03-26 MED ORDER — ACETAMINOPHEN 500 MG PO TABS: 500.0000 mg | ORAL_TABLET | Freq: Four times a day (QID) | ORAL | Status: DC | PRN

## 2018-03-26 MED ORDER — DOCUSATE SODIUM 100 MG PO CAPS: 100.0000 mg | ORAL_CAPSULE | Freq: Two times a day (BID) | ORAL | Status: DC

## 2018-03-26 MED ORDER — CARVEDILOL 3.125 MG PO TABS: 3.1250 mg | ORAL_TABLET | Freq: Two times a day (BID) | ORAL | Status: DC

## 2018-03-26 MED ORDER — IPRATROPIUM-ALBUTEROL 0.5-2.5 (3) MG/3ML IN SOLN: 3.0000 mL | Freq: Once | RESPIRATORY_TRACT | Status: DC

## 2018-03-26 MED ORDER — CLONIDINE HCL 0.1 MG PO TABS: 0.2000 mg | ORAL_TABLET | Freq: Two times a day (BID) | ORAL | Status: DC

## 2018-03-26 MED ORDER — DEXTROSE 10 % IV SOLN
INTRAVENOUS | Status: DC
  Administered 2018-03-26: 17:00:00 via INTRAVENOUS

## 2018-03-26 MED ORDER — HEPARIN SODIUM (PORCINE) 1000 UNIT/ML DIALYSIS
20.0000 [IU]/kg | INTRAMUSCULAR | Status: DC | PRN
  Filled 2018-03-26: qty 2

## 2018-03-26 MED ORDER — SODIUM CHLORIDE 0.9 % IV SOLN
2.0000 g | Freq: Once | INTRAVENOUS | Status: AC
  Administered 2018-03-26: 2 g via INTRAVENOUS
  Filled 2018-03-26: qty 2

## 2018-03-26 MED ORDER — LIDOCAINE-PRILOCAINE 2.5-2.5 % EX CREA
TOPICAL_CREAM | CUTANEOUS | Status: DC
  Filled 2018-03-26: qty 5

## 2018-03-26 MED ORDER — VANCOMYCIN HCL IN DEXTROSE 1-5 GM/200ML-% IV SOLN
1000.0000 mg | Freq: Once | INTRAVENOUS | Status: AC
  Administered 2018-03-26: 1000 mg via INTRAVENOUS
  Filled 2018-03-26: qty 200

## 2018-03-26 MED ORDER — ALBUTEROL SULFATE (2.5 MG/3ML) 0.083% IN NEBU: 2.5000 mg | INHALATION_SOLUTION | Freq: Four times a day (QID) | RESPIRATORY_TRACT | Status: DC | PRN

## 2018-03-26 MED ORDER — ASPIRIN EC 81 MG PO TBEC: 162.0000 mg | DELAYED_RELEASE_TABLET | Freq: Every day | ORAL | Status: DC

## 2018-03-26 MED ORDER — FUROSEMIDE 10 MG/ML IJ SOLN
40.0000 mg | Freq: Two times a day (BID) | INTRAMUSCULAR | Status: DC
  Administered 2018-03-27: 40 mg via INTRAVENOUS
  Filled 2018-03-26: qty 4

## 2018-03-26 MED ORDER — HYDRALAZINE HCL 50 MG PO TABS: 25.0000 mg | ORAL_TABLET | Freq: Three times a day (TID) | ORAL | Status: DC

## 2018-03-26 MED ORDER — CHLORHEXIDINE GLUCONATE CLOTH 2 % EX PADS
6.0000 | MEDICATED_PAD | Freq: Every day | CUTANEOUS | Status: DC
  Administered 2018-03-27: 6 via TOPICAL
  Filled 2018-03-26: qty 6

## 2018-03-26 MED ORDER — GABAPENTIN 100 MG PO CAPS: 100.0000 mg | ORAL_CAPSULE | Freq: Every day | ORAL | Status: DC | PRN

## 2018-03-26 MED ORDER — LORATADINE 10 MG PO TABS: 10.0000 mg | ORAL_TABLET | Freq: Every day | ORAL | Status: DC

## 2018-03-26 MED ORDER — IPRATROPIUM-ALBUTEROL 0.5-2.5 (3) MG/3ML IN SOLN
3.0000 mL | RESPIRATORY_TRACT | Status: DC | PRN
  Administered 2018-03-27: 3 mL via RESPIRATORY_TRACT
  Filled 2018-03-26: qty 3

## 2018-03-26 MED ORDER — EPOETIN ALFA 4000 UNIT/ML IJ SOLN: 4000.0000 [IU] | INTRAMUSCULAR | Status: DC

## 2018-03-26 MED ORDER — LACTULOSE 10 GM/15ML PO SOLN: 20.0000 g | ORAL | Status: DC

## 2018-03-26 MED ORDER — GLUCAGON HCL (RDNA) 1 MG IJ SOLR
1.0000 mg | Freq: Once | INTRAMUSCULAR | Status: AC
  Administered 2018-03-26: 1 mg via INTRAVENOUS
  Filled 2018-03-26 (×3): qty 1

## 2018-03-26 MED ORDER — INSULIN ASPART 100 UNIT/ML ~~LOC~~ SOLN: 0.0000 [IU] | Freq: Every day | SUBCUTANEOUS | Status: DC

## 2018-03-26 NOTE — H&P (Signed)
[facility] [place] NAME: Jacqueline Rodriguez    MR#:  [id number]  DATE OF BIRTH:  1960/04/14  DATE OF ADMISSION:  03/17/2018  PRIMARY CARE PHYSICIAN: Tracie Harrier, MD   REQUESTING/REFERRING PHYSICIAN:   CHIEF COMPLAINT:   Chief Complaint  Patient presents with  . Shortness of Breath  . Hypoglycemia    HISTORY OF PRESENT ILLNESS: Jacqueline Rodriguez  is a 58 y.o. female with a known history history per below which includes end-stage renal disease, has missed hemodialysis since Tuesday, presented to emergency room with shortness of breath, noted glucose of 43 in route, hypothermic, in the emergency room patient was found to be in heart failure, potassium 5.9, creatinine 7.4, Dr. Singh/nephrology planning urgent hemodialysis, patient evaluated in the emergency room, husband at the bedside, patient wishes to be DNR going forward, patient is now being admitted for acute on chronic diastolic congestive heart failure due to acute fluid overload state from missed hemodialysis/end-stage renal disease.  PAST MEDICAL HISTORY:   Past Medical History:  Diagnosis Date  . Anemia   . Chronic bronchitis ([place])   . Chronic diastolic CHF (congestive heart failure) ([place])   . CKD (chronic kidney disease), stage III ([place])   . COPD (chronic obstructive pulmonary disease) ([place])   . Diabetes mellitus with renal complications ([place])   . ESRD (end stage renal disease) ([place])   . Essential hypertension   . GERD (gastroesophageal reflux disease)   . Obesity   . Peripheral vascular disease ([place])   . Pulmonary hypertension ([place])   . Renal insufficiency 09/19/2015   Stage 3 CKD. Beginning dialysis.    PAST SURGICAL HISTORY:  Past Surgical History:  Procedure Laterality Date  . A/V FISTULAGRAM N/A 09/16/2016   Procedure: A/V Fistulagram;  Surgeon: Algernon Huxley, MD;  Location: [facility];  Service: Cardiovascular;  Laterality: N/A;  . AV FISTULA PLACEMENT Left 10/01/2015    Procedure: ARTERIOVENOUS (AV) FISTULA CREATION ( BRACHIAL CEPHALIC );  Surgeon: Algernon Huxley, MD;  Location: [facility];  Service: Vascular;  Laterality: Left;  . CAPD INSERTION N/A 06/10/2016   Procedure: LAPAROSCOPIC INSERTION CONTINUOUS AMBULATORY PERITONEAL DIALYSIS  (CAPD) CATHETER;  Surgeon: Algernon Huxley, MD;  Location: [facility];  Service: Vascular;  Laterality: N/A;  . DIALYSIS/PERMA CATHETER INSERTION  09/16/2016   Procedure: DIALYSIS/PERMA CATHETER INSERTION;  Surgeon: Algernon Huxley, MD;  Location: [facility];  Service: Cardiovascular;;  . DIALYSIS/PERMA CATHETER REMOVAL N/A 12/06/2016   Procedure: DIALYSIS/PERMA CATHETER REMOVAL;  Surgeon: Algernon Huxley, MD;  Location: [facility];  Service: Cardiovascular;  Laterality: N/A;  . PERIPHERAL VASCULAR CATHETERIZATION N/A 07/21/2015   Procedure: Dialysis/Perma Catheter;  Surgeon: Algernon Huxley, MD;  Location: [facility];  Service: Cardiovascular;  Laterality: N/A;  . PERIPHERAL VASCULAR CATHETERIZATION N/A 12/11/2015   Procedure: Dialysis/Perma Catheter Removal;  Surgeon: Algernon Huxley, MD;  Location: [facility];  Service: Cardiovascular;  Laterality: N/A;    SOCIAL HISTORY:  Social History   Tobacco Use  . Smoking status: Former Smoker    Packs/day: 0.50    Years: 5.00    Pack years: 2.50    Types: Cigarettes    Last attempt to quit: 03/24/1985    Years since quitting: 33.0  . Smokeless tobacco: Never Used  Substance Use Topics  . Alcohol use: No    Alcohol/week: 0.0 standard drinks    FAMILY HISTORY:  Family History  Problem  Relation Age of Onset  . Hypertension Mother   . Diabetes Mellitus II Mother   . CAD Father   . Hypertension Father   . Heart attack Father     DRUG ALLERGIES:  Allergies  Allergen Reactions  . Ambien [Zolpidem] Other (See Comments)    hallucinations    REVIEW OF SYSTEMS:   CONSTITUTIONAL: No fever, fatigue or weakness.  EYES: No blurred or double vision.   EARS, NOSE, AND THROAT: No tinnitus or ear pain.  RESPIRATORY: + cough, shortness of breath, wheezing or hemoptysis.  CARDIOVASCULAR: No chest pain, orthopnea, edema.  GASTROINTESTINAL: No nausea, vomiting, diarrhea or abdominal pain.  GENITOURINARY: No dysuria, hematuria.  ENDOCRINE: No polyuria, nocturia,  HEMATOLOGY: No anemia, easy bruising or bleeding SKIN: No rash or lesion. MUSCULOSKELETAL: No joint pain or arthritis.   NEUROLOGIC: No tingling, numbness, weakness.  PSYCHIATRY: No anxiety or depression.   MEDICATIONS AT HOME:  Prior to Admission medications   Medication Sig Start Date End Date Taking? Authorizing Provider  acetaminophen (TYLENOL) 500 MG tablet Take 500 mg by mouth every 6 (six) hours as needed.    [provider]  albuterol (PROVENTIL HFA;VENTOLIN HFA) 108 (90 Base) MCG/ACT inhaler Inhale 2 puffs into the lungs every 6 (six) hours as needed for wheezing or shortness of breath. 01/08/15   Gregor Hams, MD  amLODipine (NORVASC) 5 MG tablet Take 5 mg by mouth daily. 06/22/17   [provider]  aspirin 81 MG EC tablet Take 2 tablets (162 mg total) by mouth at bedtime. 12/31/17   Fritzi Mandes, MD  cetirizine (ZYRTEC) 10 MG tablet Take 10 mg by mouth daily as needed for allergies.    [provider]  cloNIDine (CATAPRES) 0.2 MG tablet Take 0.2 mg by mouth 2 (two) times daily. 07/02/17   [provider]  docusate sodium (COLACE) 100 MG capsule Take 100 mg by mouth 2 (two) times daily.    [provider]  furosemide (LASIX) 40 MG tablet Take 40 mg by mouth daily. 12/04/17   [provider]  gabapentin (NEURONTIN) 100 MG capsule Take 100 mg by mouth 3 (three) times daily as needed. For neuropathy. (patient usually only takes one capsule a day) 03/05/15   [provider]  GLUCOSAMINE-CHONDROITIN PO Take 4,000 mg by mouth daily.    [provider]  hydrALAZINE (APRESOLINE) 25 MG tablet Take 25 mg by mouth  3 (three) times daily.    [provider]  isosorbide mononitrate (IMDUR) 30 MG 24 hr tablet Take 1 tablet by mouth daily.    [provider]  lactulose (CHRONULAC) 10 GM/15ML solution Take 30 mLs by mouth every other day.  08/15/17   [provider]  lidocaine-prilocaine (EMLA) cream Apply topically 3 (three) times a week. Apply 45 minutes prior to dialysis treatment three times a week 10/22/17   [provider]  meclizine (ANTIVERT) 25 MG tablet Take 1 tablet (25 mg total) by mouth 3 (three) times daily as needed for dizziness. Patient taking differently: Take 25 mg by mouth daily.  12/08/15   Nance Pear, MD      PHYSICAL EXAMINATION:   VITAL SIGNS: Blood pressure 126/65, pulse 85, temperature (!) 96.4 F (35.8 C), temperature source Rectal, resp. rate 20, height 5\' 8"  (1.727 m), weight 84.8 kg, last menstrual period 09/18/2013, SpO2 96 %.  GENERAL:  58 y.o.-year-old patient lying in the bed with no acute distress.  Obese EYES: Pupils equal, round, reactive to light and  accommodation. No scleral icterus. Extraocular muscles intact.  HEENT: Head atraumatic, normocephalic. Oropharynx and nasopharynx clear.  NECK:  Supple, no jugular venous distention. No thyroid enlargement, no tenderness.  LUNGS: Bilateral diminished breath sounds with Rales/rhonchi.  No use of accessory muscles of respiration.  CARDIOVASCULAR: S1, S2 normal. No murmurs, rubs, or gallops.  ABDOMEN: Soft, nontender, nondistended. Bowel sounds present. No organomegaly or mass.  EXTREMITIES: No pedal edema, cyanosis, or clubbing.  NEUROLOGIC: Cranial nerves II through XII are intact. Muscle strength 5/5 in all extremities. Sensation intact. Gait not checked.  PSYCHIATRIC: The patient is lethargic, oriented x 3.  SKIN: No obvious rash, lesion, or ulcer.   LABORATORY PANEL:   CBC Recent Labs  Lab 03/23/2018 1620  WBC 8.2  HGB 11.0*  HCT 35.1*  PLT 234  MCV 90.9  MCH 28.5  MCHC  31.3  RDW 14.6  LYMPHSABS 0.3*  MONOABS 0.4  EOSABS 0.0  BASOSABS 0.0   ------------------------------------------------------------------------------------------------------------------  Chemistries  Recent Labs  Lab 04/05/2018 1620  NA 136  K 5.9*  CL 97*  CO2 23  GLUCOSE 39*  BUN 43*  CREATININE 7.49*  CALCIUM 8.0*  AST 27  ALT 17  ALKPHOS 69  BILITOT 0.7   ------------------------------------------------------------------------------------------------------------------ estimated creatinine clearance is 9.1 mL/min (A) (by C-G formula based on SCr of 7.49 mg/dL (H)). ------------------------------------------------------------------------------------------------------------------ No results for input(s): TSH, T4TOTAL, T3FREE, THYROIDAB in the last 72 hours.  Invalid input(s): FREET3   Coagulation profile No results for input(s): INR, PROTIME in the last 168 hours. ------------------------------------------------------------------------------------------------------------------- No results for input(s): DDIMER in the last 72 hours. -------------------------------------------------------------------------------------------------------------------  Cardiac Enzymes No results for input(s): CKMB, TROPONINI, MYOGLOBIN in the last 168 hours.  Invalid input(s): CK ------------------------------------------------------------------------------------------------------------------ Invalid input(s): POCBNP  ---------------------------------------------------------------------------------------------------------------  Urinalysis    Component Value Date/Time   COLORURINE YELLOW (A) 12/03/2016 1059   APPEARANCEUR CLOUDY (A) 12/03/2016 1059   APPEARANCEUR Hazy 12/29/2013 2030   LABSPEC 1.016 12/03/2016 1059   LABSPEC 1.028 12/29/2013 2030   PHURINE 5.0 12/03/2016 1059   GLUCOSEU >=500 (A) 12/03/2016 1059   GLUCOSEU >=500 12/29/2013 2030   HGBUR SMALL (A) 12/03/2016 1059    BILIRUBINUR NEGATIVE 12/03/2016 1059   BILIRUBINUR Negative 12/29/2013 2030   KETONESUR NEGATIVE 12/03/2016 1059   PROTEINUR >=300 (A) 12/03/2016 1059   NITRITE NEGATIVE 12/03/2016 1059   LEUKOCYTESUR TRACE (A) 12/03/2016 1059   LEUKOCYTESUR Negative 12/29/2013 2030     RADIOLOGY: Dg Chest Port 1 View  Result Date: 04/03/2018 CLINICAL DATA:  Patient with shortness of breath EXAM: PORTABLE CHEST 1 VIEW COMPARISON:  Chest radiograph 02/17/2018 FINDINGS: Monitoring leads overlie the patient. Stable cardiomegaly. Persistent moderate right pleural effusion. Small left pleural effusion. Underlying opacities. Diffuse bilateral interstitial opacities. IMPRESSION: Moderate right and small left pleural effusions with underlying opacities. Findings suggestive of pulmonary edema. Electronically Signed   By: Lovey Newcomer M.D.   On: 03/22/2018 16:40    EKG: Orders placed or performed during the hospital encounter of 03/29/2018  . EKG 12-Lead  . EKG 12-Lead    IMPRESSION AND PLAN: *Acute on chronic diastolic congestive heart failure exacerbation Secondary to acute fluid overload state from missed hemodialysis/end-stage renal disease Admit to regular nursing floor bed with telemetry on our congestive heart failure protocol, IV Lasix twice daily, strict I&O monitoring, daily weights, Dr. Singh/nephrology planning urgent hemodialysis, supplemental oxygen wean as tolerated  *Acute fluid overload state secondary to end-stage renal disease/missed hemodialysis Plan of care as stated above  *Chronic end-stage renal disease Plan of  care as stated above  *Acute hypoglycemia chronic diabetes mellitus type 2 Etiology unknown Continue IV fluids with dextrose, Accu-Cheks every 2 hours for now, sliding scale insulin with Accu-Cheks per routine, renal diet given hypoglycemia with fluid restriction  *Chronic benign essential hypertension Stable Continue current regiment   All the records are reviewed and case  discussed with ED provider. Management plans discussed with the patient, family and they are in agreement.  CODE STATUS:dnr Code Status History    Date Active Date Inactive Code Status Order ID Comments User Context   02/18/2018 0005 02/20/2018 0132 Full Code [id number]  Gladstone Lighter, MD Inpatient   12/28/2017 2322 01/02/2018 2102 Full Code [id number]  Sedalia Muta, MD ED   12/21/2017 1215 12/23/2017 0930 Full Code [id number]  Nicholes Mango, MD Inpatient   09/07/2017 1813 09/10/2017 2239 Full Code [id number]  Henreitta Leber, MD Inpatient   12/16/2016 0814 12/17/2016 1438 Full Code [id number]  Saundra Shelling, MD Inpatient   12/03/2016 1201 12/04/2016 [place] Full Code [id number]  Max Sane, MD Inpatient   09/13/2016 1629 09/17/2016 1817 Full Code [id number]  Epifanio Lesches, MD ED   09/08/2016 1631 09/09/2016 1823 Full Code [id number]  Fritzi Mandes, MD Inpatient   07/17/2015 1244 07/24/2015 1911 Full Code [id number]  Epifanio Lesches, MD ED   03/15/2015 1956 03/20/2015 1548 Full Code [id number]  Idelle Crouch, MD Inpatient    Advance Directive Documentation     Most Recent Value  Type of Advance Directive  Healthcare Power of Attorney  Pre-existing out of facility DNR order (yellow form or pink MOST form)  -  "MOST" Form in Place?  -       TOTAL TIME TAKING CARE OF THIS PATIENT: 40 minutes.    Avel Peace Chidi Shirer M.D on 03/31/2018   Between 7am to 6pm - Pager - [phone]  After 6pm go to [email or web address] - password [email or web address] [facility]  Office  [phone]  CC: Primary care physician; Tracie Harrier, MD   Note: This dictation was prepared with Dragon dictation along with smaller phrase technology. Any transcriptional errors that result from this process are unintentional.

## 2018-03-26 NOTE — ED Triage Notes (Signed)
Pt comes from home via EMS with hypoglycemia and SOB. Pt currently on 3L . Pt is dialysis and has only gone twice in last 2 weeks with last being this past Thursday. CBG was 43 at first then 55 with 250 D10. With EMS, pt was 92% on 3L. BP was 143/71.

## 2018-03-26 NOTE — ED Notes (Addendum)
Pt given OJ and applesauce. Also given PB and crackers.

## 2018-03-26 NOTE — ED Notes (Signed)
HUSBAND WOULD LIKE TO BE CONTACTED WITH PT IS DONE WITH DIALYSIS

## 2018-03-26 NOTE — ED Notes (Signed)
CBG 31 at this time.

## 2018-03-26 NOTE — Progress Notes (Signed)
Pre HD Tx    03/15/2018 1950  Vital Signs  Temp Source Axillary  Pulse Rate 86  Pulse Rate Source Monitor  Resp 16  BP (!) 122/51  BP Location Right Arm  BP Method Automatic  Patient Position (if appropriate) Lying  Oxygen Therapy  SpO2 97 %  O2 Device Nasal Cannula  O2 Flow Rate (L/min) 4 L/min  Pain Assessment  Pain Scale 0-10  Pain Score 0  Dialysis Weight  Weight 84.8 kg  Type of Weight Pre-Dialysis  Time-Out for Hemodialysis  What Procedure? Hemodialysis   Pt Identifiers(min of two) First/Last Name;MRN/Account#  Correct Site? Yes  Correct Side? Yes  Correct Procedure? Yes  Consents Verified? Yes  Rad Studies Available? N/A  Safety Precautions Reviewed? Yes  Engineer, civil (consulting) Number 2  Station Number 1  UF/Alarm Test Passed  Conductivity: Meter 14  Conductivity: Machine  13.8  pH 7.4  Reverse Osmosis Main  Normal Saline Lot Number [id number]  Machine Temperature 98.6 F (37 C)  Musician and Audible Yes  Blood Lines Intact and Secured Yes  Pre Treatment Patient Checks  Vascular access used during treatment Fistula  Hepatitis B Surface Antigen Results Negative  Date Hepatitis B Surface Antigen Drawn 03/23/18  Hepatitis B Surface Antibody  (<10)  Date Hepatitis B Surface Antibody Drawn 03/23/18  Hemodialysis Consent Verified Yes  Hemodialysis Standing Orders Initiated Yes  ECG (Telemetry) Monitor On Yes  Prime Ordered Normal Saline  Length of  DialysisTreatment -hour(s) 3 Hour(s)  Dialysis Treatment Comments Na 140  Dialyzer Elisio 17H NR  Dialysate 2K, 2.5 Ca  Dialysate Flow Ordered 800  Blood Flow Rate Ordered 400 mL/min  Dialysis Blood Pressure Support Ordered Normal Saline  Education / Care Plan  Dialysis Education Provided Yes  Documented Education in Care Plan Yes  Fistula / Graft Left Upper arm Arteriovenous fistula  Placement Date/Time: 10/01/15 1110   Placed prior to admission: No  Orientation: Left  Access Location: Upper arm   Access Type: Arteriovenous fistula  Site Condition No complications  Fistula / Graft Assessment Present;Thrill;Bruit  Status Accessed  Needle Size 15  Drainage Description None

## 2018-03-26 NOTE — Progress Notes (Signed)
HD Tx End   04/11/2018 2300  Hand-Off documentation  Report given to (Full Name) [facility] ED RN  Report received from (Full Name) Trellis Paganini RN  Vital Signs  Temp 98.2 F (36.8 C)  Temp Source Axillary  Pulse Rate 97  Pulse Rate Source Monitor  Resp 20  BP 130/62  BP Location Right Arm  BP Method Automatic  Patient Position (if appropriate) Lying  Oxygen Therapy  SpO2 100 %  O2 Device Nasal Cannula  O2 Flow Rate (L/min) 4 L/min  Pain Assessment  Pain Scale 0-10  Pain Score 0  Dialysis Weight  Weight 81.8 kg  Type of Weight Post-Dialysis  During Hemodialysis Assessment  Blood Flow Rate (mL/min) 200 mL/min  Arterial Pressure (mmHg) -100 mmHg  Venous Pressure (mmHg) 120 mmHg  Transmembrane Pressure (mmHg) 40 mmHg  Ultrafiltration Rate (mL/min) 1160 mL/min  Dialysate Flow Rate (mL/min) 800 ml/min  Conductivity: Machine  14  HD Safety Checks Performed Yes  Dialysis Fluid Bolus Normal Saline  Bolus Amount (mL) 250 mL  Intra-Hemodialysis Comments Tx completed (CBG low again, 40. 2 glucose tabs given, pt alert.)  Fistula / Graft Left Upper arm Arteriovenous fistula  Placement Date/Time: 10/01/15 1110   Placed prior to admission: No  Orientation: Left  Access Location: Upper arm  Access Type: Arteriovenous fistula  Site Condition No complications  Fistula / Graft Assessment Present;Thrill;Bruit  Status Deaccessed  Drainage Description None

## 2018-03-26 NOTE — Progress Notes (Signed)
Family Meeting Note  Advance Directive:yes  Today a meeting took place with the Patient and spouse.  Patient is able to participate   The following clinical team members were present during this meeting:MD  The following were discussed:Patient's diagnosis: Respiratory failure, Patient's progosis: Unable to determine and Goals for treatment: DNR  Additional follow-up to be provided: prn  Time spent during discussion:20 minutes  Gorden Harms, MD

## 2018-03-26 NOTE — Progress Notes (Signed)
HD Tx Start   03/22/2018 2000  Vital Signs  Pulse Rate 85  Resp 18  BP (!) 146/69  Oxygen Therapy  SpO2 98 %  During Hemodialysis Assessment  Blood Flow Rate (mL/min) 400 mL/min  Arterial Pressure (mmHg) -170 mmHg  Venous Pressure (mmHg) 180 mmHg  Transmembrane Pressure (mmHg) 70 mmHg  Ultrafiltration Rate (mL/min) 1160 mL/min (1160 mL removed per HOUR )  Dialysate Flow Rate (mL/min) 800 ml/min  Conductivity: Machine  14  HD Safety Checks Performed Yes  Dialysis Fluid Bolus Normal Saline  Bolus Amount (mL) 250 mL  Intra-Hemodialysis Comments Tx initiated

## 2018-03-26 NOTE — ED Notes (Signed)
Pt given 3 blankets and will recheck temp.

## 2018-03-26 NOTE — ED Notes (Signed)
Pt also given two more blankets.

## 2018-03-26 NOTE — Progress Notes (Signed)
Post HD Tx   03/30/2018 2305  Vital Signs  Pulse Rate 98  Resp 17  BP (!) 150/64  Oxygen Therapy  SpO2 100 %  Post-Hemodialysis Assessment  Rinseback Volume (mL) 250 mL  KECN 67.2 V  Dialyzer Clearance Lightly streaked  Duration of HD Treatment -hour(s) 3 hour(s)  Hemodialysis Intake (mL) 500 mL  UF Total -Machine (mL) 3500 mL  Net UF (mL) 3000 mL  Tolerated HD Treatment Yes  Post-Hemodialysis Comments rechecking CBG, RN in ED aware of low CBG issue  AVG/AVF Arterial Site Held (minutes) 15 minutes  AVG/AVF Venous Site Held (minutes) 15 minutes

## 2018-03-26 NOTE — Progress Notes (Signed)
Pre HD Assessment    04/11/2018 1951  Neurological  Level of Consciousness Responds to Voice  Orientation Level Oriented X4  Respiratory  Respiratory Pattern Labored;Dyspnea at rest  Chest Assessment Chest expansion symmetrical  Bilateral Breath Sounds Expiratory wheezes;Coarse crackles  Cough Non-productive  Cardiac  Pulse Regular  ECG Monitor Yes  Cardiac Rhythm NSR  Vascular  R Radial Pulse +2  L Radial Pulse +2  Edema Generalized  Integumentary  Integumentary (WDL) X  Skin Color Pale  Skin Condition Dry  Additional Integumentary Comments  (HD pt )  Musculoskeletal  Musculoskeletal (WDL) X  Generalized Weakness Yes  Psychosocial  Psychosocial (WDL) WDL  Emotional support given Given to patient

## 2018-03-26 NOTE — ED Notes (Signed)
Pt given two OJ

## 2018-03-26 NOTE — Progress Notes (Signed)
Post HD Assessment    04/05/2018 2313  Neurological  Level of Consciousness Responds to Voice  Orientation Level Oriented X4  Respiratory  Respiratory Pattern Labored;Dyspnea at rest  Chest Assessment Chest expansion symmetrical  Bilateral Breath Sounds Expiratory wheezes;Coarse crackles;Inspiratory wheezes  Cough Non-productive  Cardiac  Pulse Regular  ECG Monitor Yes  Cardiac Rhythm NSR  Vascular  R Radial Pulse +2  L Radial Pulse +2  Edema Generalized  Integumentary  Integumentary (WDL) X  Skin Color Pale  Skin Condition Dry  Musculoskeletal  Musculoskeletal (WDL) X  Generalized Weakness Yes  Psychosocial  Psychosocial (WDL) WDL  Emotional support given Given to patient

## 2018-03-26 NOTE — ED Provider Notes (Addendum)
[facility] Emergency Department Provider Note    First MD Initiated Contact with Patient 04/11/2018 1606     (approximate)  I have reviewed the triage vital signs and the nursing notes.   HISTORY  Chief Complaint Shortness of Breath and Hypoglycemia    HPI Jacqueline Rodriguez is a 58 y.o. female below listed past medical history arrives with respiratory distress as well as swelling.  Having difficulty catching her breath.  Has missed several days of dialysis due to difficulty with transportation.  Denies any measured fevers but has had productive cough.  Not currently on any antibiotics.  Does also have a history of COPD.  Denies any abdominal pain but does endorse significant lower extremity swelling.    Past Medical History:  Diagnosis Date  . Anemia   . Chronic bronchitis ([place])   . Chronic diastolic CHF (congestive heart failure) ([place])   . CKD (chronic kidney disease), stage III ([place])   . COPD (chronic obstructive pulmonary disease) ([place])   . Diabetes mellitus with renal complications ([place])   . ESRD (end stage renal disease) ([place])   . Essential hypertension   . GERD (gastroesophageal reflux disease)   . Obesity   . Peripheral vascular disease ([place])   . Pulmonary hypertension ([place])   . Renal insufficiency 09/19/2015   Stage 3 CKD. Beginning dialysis.   Family History  Problem Relation Age of Onset  . Hypertension Mother   . Diabetes Mellitus II Mother   . CAD Father   . Hypertension Father   . Heart attack Father    Past Surgical History:  Procedure Laterality Date  . A/V FISTULAGRAM N/A 09/16/2016   Procedure: A/V Fistulagram;  Surgeon: Algernon Huxley, MD;  Location: [facility];  Service: Cardiovascular;  Laterality: N/A;  . AV FISTULA PLACEMENT Left 10/01/2015   Procedure: ARTERIOVENOUS (AV) FISTULA CREATION ( BRACHIAL CEPHALIC );  Surgeon: Algernon Huxley, MD;  Location: [facility];  Service: Vascular;  Laterality: Left;  . CAPD INSERTION N/A  06/10/2016   Procedure: LAPAROSCOPIC INSERTION CONTINUOUS AMBULATORY PERITONEAL DIALYSIS  (CAPD) CATHETER;  Surgeon: Algernon Huxley, MD;  Location: [facility];  Service: Vascular;  Laterality: N/A;  . DIALYSIS/PERMA CATHETER INSERTION  09/16/2016   Procedure: DIALYSIS/PERMA CATHETER INSERTION;  Surgeon: Algernon Huxley, MD;  Location: [facility];  Service: Cardiovascular;;  . DIALYSIS/PERMA CATHETER REMOVAL N/A 12/06/2016   Procedure: DIALYSIS/PERMA CATHETER REMOVAL;  Surgeon: Algernon Huxley, MD;  Location: [facility];  Service: Cardiovascular;  Laterality: N/A;  . PERIPHERAL VASCULAR CATHETERIZATION N/A 07/21/2015   Procedure: Dialysis/Perma Catheter;  Surgeon: Algernon Huxley, MD;  Location: [facility];  Service: Cardiovascular;  Laterality: N/A;  . PERIPHERAL VASCULAR CATHETERIZATION N/A 12/11/2015   Procedure: Dialysis/Perma Catheter Removal;  Surgeon: Algernon Huxley, MD;  Location: [facility];  Service: Cardiovascular;  Laterality: N/A;   Patient Active Problem List   Diagnosis Date Noted  . Hypoglycemia 12/28/2017  . AMS (altered mental status) 12/21/2017  . Hypotension 09/07/2017  . Peritoneal dialysis catheter exit site infection ([place]) 10/05/2016  . 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 Hypocalcemia 09/14/2016  . Hyperkalemia 09/13/2016  . Syncope 09/08/2016  . ESRD on dialysis ([place]) 05/25/2016  . Chronic diastolic heart failure ([place]) 07/30/2015  . Acute renal failure superimposed on stage 3 chronic kidney disease ([place]) 07/18/2015  . Diabetes mellitus with renal complications ([place])   . CKD (chronic kidney disease), stage III ([place])   .  Pulmonary hypertension ([place])   . Obesity   . Morbid obesity due to excess calories ([place])   . Anemia in chronic renal disease   . Shortness of breath   . Back pain 05/01/2015  . Essential hypertension 03/25/2015  . Diabetes ([place]) 03/25/2015      Prior to Admission medications   Medication Sig Start Date End  Date Taking? Authorizing Provider  acetaminophen (TYLENOL) 500 MG tablet Take 500 mg by mouth every 6 (six) hours as needed.    [provider]  albuterol (PROVENTIL HFA;VENTOLIN HFA) 108 (90 Base) MCG/ACT inhaler Inhale 2 puffs into the lungs every 6 (six) hours as needed for wheezing or shortness of breath. 01/08/15   Gregor Hams, MD  amLODipine (NORVASC) 5 MG tablet Take 5 mg by mouth daily. 06/22/17   [provider]  aspirin 81 MG EC tablet Take 2 tablets (162 mg total) by mouth at bedtime. 12/31/17   Fritzi Mandes, MD  cetirizine (ZYRTEC) 10 MG tablet Take 10 mg by mouth daily as needed for allergies.    [provider]  cloNIDine (CATAPRES) 0.2 MG tablet Take 0.2 mg by mouth 2 (two) times daily. 07/02/17   [provider]  docusate sodium (COLACE) 100 MG capsule Take 100 mg by mouth 2 (two) times daily.    [provider]  furosemide (LASIX) 40 MG tablet Take 40 mg by mouth daily. 12/04/17   [provider]  gabapentin (NEURONTIN) 100 MG capsule Take 100 mg by mouth 3 (three) times daily as needed. For neuropathy. (patient usually only takes one capsule a day) 03/05/15   [provider]  GLUCOSAMINE-CHONDROITIN PO Take 4,000 mg by mouth daily.    [provider]  hydrALAZINE (APRESOLINE) 25 MG tablet Take 25 mg by mouth 3 (three) times daily.    [provider]  isosorbide mononitrate (IMDUR) 30 MG 24 hr tablet Take 1 tablet by mouth daily.    [provider]  lactulose (CHRONULAC) 10 GM/15ML solution Take 30 mLs by mouth every other day.  08/15/17   [provider]  lidocaine-prilocaine (EMLA) cream Apply topically 3 (three) times a week. Apply 45 minutes prior to dialysis treatment three times a week 10/22/17   [provider]  meclizine (ANTIVERT) 25 MG tablet Take 1 tablet (25 mg total) by mouth 3 (three) times daily as needed for dizziness. Patient taking differently: Take 25 mg by  mouth daily.  12/08/15   Nance Pear, MD    Allergies Ambien [zolpidem]    Social History Social History   Tobacco Use  . Smoking status: Former Smoker    Packs/day: 0.50    Years: 5.00    Pack years: 2.50    Types: Cigarettes    Last attempt to quit: 03/24/1985    Years since quitting: 33.0  . Smokeless tobacco: Never Used  Substance Use Topics  . Alcohol use: No    Alcohol/week: 0.0 standard drinks  . Drug use: No    Review of Systems Patient denies headaches, rhinorrhea, blurry vision, numbness, shortness of breath, chest pain, edema, cough, abdominal pain, nausea, vomiting, diarrhea, dysuria, fevers, rashes or hallucinations unless otherwise stated above in HPI. ____________________________________________   PHYSICAL EXAM:  VITAL SIGNS: Vitals:   04/05/2018 1821 03/22/2018 1900  BP:  133/69  Pulse:  84  Resp:  18  Temp: (!) 96.4 F (35.8 C)   SpO2:  97%    Constitutional: Alert and oriented. Ill appearingEyes: Conjunctivae are normal.  Head: Atraumatic. Nose: No congestion/rhinnorhea. Mouth/Throat: Mucous membranes are moist.   Neck: No stridor. Painless ROM.  Cardiovascular: Normal rate, regular rhythm. Grossly normal heart sounds.  Good peripheral circulation. Respiratory: tachypnea with diminished posterior lung soundsGastrointestinal: Soft and nontender. No distention. No abdominal bruits. No CVA tenderness. Genitourinary: deferred Musculoskeletal: No lower extremity tenderness 2+ edema.  No joint effusions. Neurologic:  Normal speech and language. No gross focal neurologic deficits are appreciated. No facial droop Skin:  Skin is warm, dry and intact. No rash noted. Psychiatric: Mood and affect are normal. Speech and behavior are normal.  ____________________________________________   LABS (all labs ordered are listed, but only abnormal results are displayed)  Results for orders placed or performed during the hospital encounter of 03/21/2018 (from  the past 24 hour(s))  Glucose, capillary     Status: Abnormal   Collection Time: 03/13/2018  4:06 PM  Result Value Ref Range   Glucose-Capillary 31 (LL) 70 - 99 mg/dL   Comment 1 Notify RN   Comprehensive metabolic panel     Status: Abnormal   Collection Time: 03/25/2018  4:20 PM  Result Value Ref Range   Sodium 136 135 - 145 mmol/L   Potassium 5.9 (H) 3.5 - 5.1 mmol/L   Chloride 97 (L) 98 - 111 mmol/L   CO2 23 22 - 32 mmol/L   Glucose, Bld 39 (LL) 70 - 99 mg/dL   BUN 43 (H) 6 - 20 mg/dL   Creatinine, Ser 7.49 (H) 0.44 - 1.00 mg/dL   Calcium 8.0 (L) 8.9 - 10.3 mg/dL   Total Protein 7.5 6.5 - 8.1 g/dL   Albumin 3.4 (L) 3.5 - 5.0 g/dL   AST 27 15 - 41 U/L   ALT 17 0 - 44 U/L   Alkaline Phosphatase 69 38 - 126 U/L   Total Bilirubin 0.7 0.3 - 1.2 mg/dL   GFR calc non Af Amer 5 (L) >60 mL/min   GFR calc Af Amer 6 (L) >60 mL/min   Anion gap 16 (H) 5 - 15  CBC WITH DIFFERENTIAL     Status: Abnormal   Collection Time: 03/25/2018  4:20 PM  Result Value Ref Range   WBC 8.2 4.0 - 10.5 K/uL   RBC 3.86 (L) 3.87 - 5.11 MIL/uL   Hemoglobin 11.0 (L) 12.0 - 15.0 g/dL   HCT 35.1 (L) 36.0 - 46.0 %   MCV 90.9 80.0 - 100.0 fL   MCH 28.5 26.0 - 34.0 pg   MCHC 31.3 30.0 - 36.0 g/dL   RDW 14.6 11.5 - 15.5 %   Platelets 234 150 - 400 K/uL   nRBC 0.0 0.0 - 0.2 %   Neutrophils Relative % 92 %   Neutro Abs 7.4 1.7 - 7.7 K/uL   Lymphocytes Relative 3 %   Lymphs Abs 0.3 (L) 0.7 - 4.0 K/uL   Monocytes Relative 5 %   Monocytes Absolute 0.4 0.1 - 1.0 K/uL   Eosinophils Relative 0 %   Eosinophils Absolute 0.0 0.0 - 0.5 K/uL   Basophils Relative 0 %   Basophils Absolute 0.0 0.0 - 0.1 K/uL   Immature Granulocytes 0 %   Abs Immature Granulocytes 0.03 0.00 - 0.07 K/uL  Procalcitonin     Status: None   Collection Time: 03/22/2018  4:20 PM  Result Value Ref Range   Procalcitonin 0.32 ng/mL  Glucose, capillary     Status: Abnormal   Collection Time: 04/11/2018  4:23 PM  Result Value Ref Range  Glucose-Capillary 43 (LL) 70 - 99 mg/dL   Comment 1 Notify RN    Comment 2 Document in Chart    Comment 3 Call MD NNP PA CNM   Lactic acid, plasma     Status: None   Collection Time: 03/19/2018  4:40 PM  Result Value Ref Range   Lactic Acid, Venous 1.0 0.5 - 1.9 mmol/L  Influenza panel by PCR (type A & B)     Status: None   Collection Time: 03/25/2018  4:40 PM  Result Value Ref Range   Influenza A By PCR NEGATIVE NEGATIVE   Influenza B By PCR NEGATIVE NEGATIVE  Glucose, capillary     Status: None   Collection Time: 03/25/2018  4:53 PM  Result Value Ref Range   Glucose-Capillary 95 70 - 99 mg/dL  Glucose, capillary     Status: Abnormal   Collection Time: 03/26/18  6:17 PM  Result Value Ref Range   Glucose-Capillary 100 (H) 70 - 99 mg/dL   ____________________________________________  EKG My review and personal interpretation at Time: 16:05   Indication: sob  Rate: 80  Rhythm: sinus Axis: normal Other: normal intervals, no stemi ____________________________________________  RADIOLOGY  I personally reviewed all radiographic images ordered to evaluate for the above acute complaints and reviewed radiology reports and findings.  These findings were personally discussed with the patient.  Please see medical record for radiology report.  ____________________________________________   PROCEDURES  Procedure(s) performed:  .Critical Care Performed by: Merlyn Lot, MD Authorized by: Merlyn Lot, MD   Critical care provider statement:    Critical care time (minutes):  30   Critical care time was exclusive of:  Separately billable procedures and treating other patients   Critical care was necessary to treat or prevent imminent or life-threatening deterioration of the following conditions:  Renal failure and respiratory failure   Critical care was time spent personally by me on the following activities:  Development of treatment plan with patient or surrogate, discussions with  consultants, evaluation of patient's response to treatment, examination of patient, obtaining history from patient or surrogate, ordering and performing treatments and interventions, ordering and review of laboratory studies, ordering and review of radiographic studies, pulse oximetry, re-evaluation of patient's condition and review of old charts      Critical Care performed: yes ____________________________________________   INITIAL IMPRESSION / ASSESSMENT AND PLAN / ED COURSE  Pertinent labs & imaging results that were available during my care of the patient were reviewed by me and considered in my medical decision making (see chart for details).   DDX: Asthma, copd, CHF, pna, ptx, malignancy, Pe, anemia   Shatonya S Debarge is a 58 y.o. who presents to the ED with respiratory failure with hypoxia as described above.  Looks clinically volume overloaded almost anasarca.  She is hypothermic.  Will order blood work to evaluate for evidence of sepsis.  Hypoxia improved with supplemental oxygen.  Clinical Course as of Mar 26 1911  Sun Mar 26, 2018  1655 Reassessed and feels improved on supplemental oxygen.  Chest x-ray does show effusions and edema.  Still awaiting blood work.  EKG without any hyperacute T waves.  Blood sugars stabilizing.  Will discuss with nephrology for urgent dialysis.   [PR]  1739 Potassium is 5.9.  Glucose is stabilizing.  Believe she stable for dialysis.  Discussed with hospitalist for admission.   [PR]    Clinical Course User Index [PR] Merlyn Lot, MD     As part of my  medical decision making, I reviewed the following data within the [place] notes reviewed and incorporated, Labs reviewed, notes from prior ED visits and [place] Controlled Substance Database   ____________________________________________   FINAL CLINICAL IMPRESSION(S) / ED DIAGNOSES  Final diagnoses:  Hypoglycemia  Acute respiratory failure with hypoxia ([place])   Anasarca      NEW MEDICATIONS STARTED DURING THIS VISIT:  New Prescriptions   No medications on file     Note:  This document was prepared using Dragon voice recognition software and may include unintentional dictation errors   Merlyn Lot, MD 03/15/2018 Marko Stai    Merlyn Lot, MD 03/26/18 1914

## 2018-03-26 NOTE — ED Notes (Signed)
Dialysis nurse called this nurse and notified of arrival time in about an hour. RN notified of pt potassium and will call this nurse back when dialysis is ready downstairs.

## 2018-03-27 ENCOUNTER — Inpatient Hospital Stay: Payer: Medicare Other

## 2018-03-27 ENCOUNTER — Encounter: Payer: Self-pay | Admitting: Pulmonary Disease

## 2018-03-27 DIAGNOSIS — L8990 Pressure ulcer of unspecified site, unspecified stage: Secondary | ICD-10-CM

## 2018-03-27 LAB — LACTIC ACID, PLASMA: Lactic Acid, Venous: 1.5 mmol/L (ref 0.5–1.9)

## 2018-03-27 LAB — GLUCOSE, CAPILLARY
GLUCOSE-CAPILLARY: 21 mg/dL — AB (ref 70–99)
Glucose-Capillary: 123 mg/dL — ABNORMAL HIGH (ref 70–99)
Glucose-Capillary: 148 mg/dL — ABNORMAL HIGH (ref 70–99)
Glucose-Capillary: 27 mg/dL — CL (ref 70–99)
Glucose-Capillary: 30 mg/dL — CL (ref 70–99)
Glucose-Capillary: 110 mg/dL — ABNORMAL HIGH (ref 70–99)
Glucose-Capillary: 42 mg/dL — CL (ref 70–99)
Glucose-Capillary: 100 mg/dL — ABNORMAL HIGH (ref 70–99)
Glucose-Capillary: 69 mg/dL — ABNORMAL LOW (ref 70–99)
Glucose-Capillary: <10 mg/dL — CL (ref 70–99)
Glucose-Capillary: 156 mg/dL — ABNORMAL HIGH (ref 70–99)
Glucose-Capillary: 25 mg/dL — CL (ref 70–99)
Glucose-Capillary: 54 mg/dL — ABNORMAL LOW (ref 70–99)
Glucose-Capillary: 58 mg/dL — ABNORMAL LOW (ref 70–99)
Glucose-Capillary: <10 mg/dL — CL (ref 70–99)
Glucose-Capillary: <10 mg/dL — CL (ref 70–99)

## 2018-03-27 LAB — COMPREHENSIVE METABOLIC PANEL
ALT: 14 U/L (ref 0–44)
AST: 21 U/L (ref 15–41)
Albumin: 2.5 g/dL — ABNORMAL LOW (ref 3.5–5.0)
Alkaline Phosphatase: 51 U/L (ref 38–126)
Anion gap: 11 (ref 5–15)
BUN: 24 mg/dL — ABNORMAL HIGH (ref 6–20)
CO2: 28 mmol/L (ref 22–32)
Calcium: 7.2 mg/dL — ABNORMAL LOW (ref 8.9–10.3)
Chloride: 97 mmol/L — ABNORMAL LOW (ref 98–111)
Creatinine, Ser: 5.42 mg/dL — ABNORMAL HIGH (ref 0.44–1.00)
GFR calc Af Amer: 9 mL/min — ABNORMAL LOW (ref >60)
GFR calc non Af Amer: 8 mL/min — ABNORMAL LOW (ref >60)
Glucose, Bld: 120 mg/dL — ABNORMAL HIGH (ref 70–99)
Potassium: 4.9 mmol/L (ref 3.5–5.1)
Sodium: 136 mmol/L (ref 135–145)
Total Bilirubin: 0.5 mg/dL (ref 0.3–1.2)
Total Protein: 5.7 g/dL — ABNORMAL LOW (ref 6.5–8.1)

## 2018-03-27 LAB — BASIC METABOLIC PANEL
Anion gap: 11 (ref 5–15)
BUN: 26 mg/dL — ABNORMAL HIGH (ref 6–20)
CO2: 28 mmol/L (ref 22–32)
Calcium: 7.2 mg/dL — ABNORMAL LOW (ref 8.9–10.3)
Chloride: 97 mmol/L — ABNORMAL LOW (ref 98–111)
Creatinine, Ser: 5.33 mg/dL — ABNORMAL HIGH (ref 0.44–1.00)
GFR calc Af Amer: 9 mL/min — ABNORMAL LOW (ref >60)
GFR calc non Af Amer: 8 mL/min — ABNORMAL LOW (ref >60)
Glucose, Bld: 75 mg/dL (ref 70–99)
Potassium: 4.9 mmol/L (ref 3.5–5.1)
Sodium: 136 mmol/L (ref 135–145)

## 2018-03-27 LAB — CBC WITH DIFFERENTIAL/PLATELET
Abs Immature Granulocytes: 0.03 10*3/uL (ref 0.00–0.07)
BASOS PCT: 0 %
Basophils Absolute: 0 10*3/uL (ref 0.0–0.1)
Eosinophils Absolute: 0 10*3/uL (ref 0.0–0.5)
Eosinophils Relative: 0 %
HCT: 27.6 % — ABNORMAL LOW (ref 36.0–46.0)
Hemoglobin: 8.4 g/dL — ABNORMAL LOW (ref 12.0–15.0)
Immature Granulocytes: 0 %
Lymphocytes Relative: 2 %
Lymphs Abs: 0.2 10*3/uL — ABNORMAL LOW (ref 0.7–4.0)
MCH: 28.2 pg (ref 26.0–34.0)
MCHC: 30.4 g/dL (ref 30.0–36.0)
MCV: 92.6 fL (ref 80.0–100.0)
Monocytes Absolute: 0.3 10*3/uL (ref 0.1–1.0)
Monocytes Relative: 4 %
Neutro Abs: 7.3 10*3/uL (ref 1.7–7.7)
Neutrophils Relative %: 94 %
Platelets: 191 10*3/uL (ref 150–400)
RBC: 2.98 MIL/uL — ABNORMAL LOW (ref 3.87–5.11)
RDW: 14.6 % (ref 11.5–15.5)
WBC: 7.8 10*3/uL (ref 4.0–10.5)
nRBC: 0 % (ref 0.0–0.2)

## 2018-03-27 LAB — PHOSPHORUS
PHOSPHORUS: 6.3 mg/dL — AB (ref 2.5–4.6)
Phosphorus: 6.2 mg/dL — ABNORMAL HIGH (ref 2.5–4.6)

## 2018-03-27 LAB — MAGNESIUM: Magnesium: 1.6 mg/dL — ABNORMAL LOW (ref 1.7–2.4)

## 2018-03-27 LAB — PROTIME-INR
INR: 1.1 (ref 0.8–1.2)
Prothrombin Time: 14.5 seconds (ref 11.4–15.2)

## 2018-03-27 LAB — APTT: aPTT: 34 seconds (ref 24–36)

## 2018-03-27 LAB — GLUCOSE, RANDOM: Glucose, Bld: 68 mg/dL — ABNORMAL LOW (ref 70–99)

## 2018-03-27 LAB — PROCALCITONIN: Procalcitonin: 1.9 ng/mL

## 2018-03-27 LAB — MRSA PCR SCREENING: MRSA by PCR: NEGATIVE

## 2018-03-27 MED ORDER — LORAZEPAM 2 MG/ML IJ SOLN
1.0000 mg | INTRAMUSCULAR | Status: AC
  Administered 2018-03-27: 1 mg via INTRAVENOUS

## 2018-03-27 MED ORDER — DEXTROSE 10 % IV SOLN
INTRAVENOUS | Status: DC
  Administered 2018-03-27: 05:00:00 via INTRAVENOUS

## 2018-03-27 MED ORDER — DEXTROSE 50 % IV SOLN
1.0000 | Freq: Once | INTRAVENOUS | Status: AC
  Administered 2018-03-27: 50 mL via INTRAVENOUS

## 2018-03-27 MED ORDER — DEXTROSE 50 % IV SOLN
2.0000 | Freq: Once | INTRAVENOUS | Status: AC
  Administered 2018-03-27: 100 mL via INTRAVENOUS

## 2018-03-27 MED ORDER — MORPHINE BOLUS VIA INFUSION
2.0000 mg | INTRAVENOUS | Status: DC | PRN
  Filled 2018-03-27: qty 2

## 2018-03-27 MED ORDER — MORPHINE 100MG IN NS 100ML (1MG/ML) PREMIX INFUSION
4.0000 mg/h | INTRAVENOUS | Status: DC
  Administered 2018-03-27: 4 mg/h via INTRAVENOUS
  Filled 2018-03-27: qty 100

## 2018-03-27 MED ORDER — ACETAMINOPHEN 650 MG RE SUPP: 650.0000 mg | RECTAL | Status: DC | PRN

## 2018-03-27 MED ORDER — DEXTROSE 50 % IV SOLN
INTRAVENOUS | Status: AC
  Filled 2018-03-27: qty 50

## 2018-03-27 MED ORDER — SODIUM CHLORIDE 0.9% FLUSH: 3.0000 mL | INTRAVENOUS | Status: DC | PRN

## 2018-03-27 MED ORDER — DEXTROSE 50 % IV SOLN
50.0000 mL | Freq: Once | INTRAVENOUS | Status: AC
  Administered 2018-03-27: 50 mL via INTRAVENOUS

## 2018-03-27 MED ORDER — LORAZEPAM 2 MG/ML IJ SOLN
INTRAMUSCULAR | Status: AC
  Filled 2018-03-27: qty 1

## 2018-03-27 MED ORDER — LORAZEPAM 2 MG/ML IJ SOLN: 2.0000 mg | INTRAMUSCULAR | Status: DC | PRN

## 2018-03-27 MED ORDER — LEVETIRACETAM IN NACL 500 MG/100ML IV SOLN: 500.0000 mg | Freq: Two times a day (BID) | INTRAVENOUS | Status: DC

## 2018-03-27 MED ORDER — DEXTROSE 50 % IV SOLN: 50.0000 mL | Freq: Once | INTRAVENOUS | Status: AC

## 2018-03-27 MED ORDER — GLUCAGON HCL RDNA (DIAGNOSTIC) 1 MG IJ SOLR
1.0000 mg | Freq: Once | INTRAMUSCULAR | Status: AC | PRN
  Administered 2018-03-27: 1 mg via INTRAVENOUS
  Filled 2018-03-27: qty 1

## 2018-03-27 MED ORDER — DEXTROSE 50 % IV SOLN
INTRAVENOUS | Status: AC
  Filled 2018-03-27: qty 100

## 2018-03-27 MED ORDER — SODIUM CHLORIDE 0.9 % IV SOLN: 250.0000 mL | INTRAVENOUS | Status: DC | PRN

## 2018-03-27 MED ORDER — MORPHINE SULFATE (PF) 2 MG/ML IV SOLN: 2.0000 mg | INTRAVENOUS | Status: DC | PRN

## 2018-03-27 MED ORDER — FUROSEMIDE 10 MG/ML IJ SOLN
60.0000 mg | Freq: Once | INTRAMUSCULAR | Status: AC
  Administered 2018-03-27: 60 mg via INTRAVENOUS
  Filled 2018-03-27: qty 6

## 2018-03-27 MED ORDER — GLUCAGON HCL RDNA (DIAGNOSTIC) 1 MG IJ SOLR
INTRAMUSCULAR | Status: AC
  Filled 2018-03-27: qty 1

## 2018-03-27 MED ORDER — HEPARIN SODIUM (PORCINE) 5000 UNIT/ML IJ SOLN
5000.0000 [IU] | Freq: Three times a day (TID) | INTRAMUSCULAR | Status: DC
  Administered 2018-03-27: 5000 [IU] via SUBCUTANEOUS
  Filled 2018-03-27: qty 1

## 2018-03-27 MED ORDER — LEVETIRACETAM IN NACL 1000 MG/100ML IV SOLN
1000.0000 mg | Freq: Once | INTRAVENOUS | Status: AC
  Administered 2018-03-27: 1000 mg via INTRAVENOUS
  Filled 2018-03-27: qty 100

## 2018-03-27 MED ORDER — GLUCAGON HCL RDNA (DIAGNOSTIC) 1 MG IJ SOLR
1.0000 mg | Freq: Once | INTRAMUSCULAR | Status: AC
  Administered 2018-03-27: 1 mg via INTRAVENOUS

## 2018-03-27 MED ORDER — SODIUM CHLORIDE 0.9% FLUSH: 3.0000 mL | Freq: Two times a day (BID) | INTRAVENOUS | Status: DC

## 2018-03-27 MED ORDER — MORPHINE 100MG IN NS 100ML (1MG/ML) PREMIX INFUSION
4.0000 mg/h | INTRAVENOUS | Status: DC
  Filled 2018-03-27: qty 100

## 2018-03-27 MED ORDER — GLUCAGON HCL RDNA (DIAGNOSTIC) 1 MG IJ SOLR: 1.0000 mg | Freq: Once | INTRAMUSCULAR | Status: DC | PRN

## 2018-03-28 LAB — URINE CULTURE: Culture: NO GROWTH

## 2018-03-30 LAB — GLUCOSE, CAPILLARY
Glucose-Capillary: <10 mg/dL — CL (ref 70–99)
Glucose-Capillary: 23 mg/dL — CL (ref 70–99)

## 2018-03-31 LAB — CULTURE, BLOOD (ROUTINE X 2)
CULTURE: NO GROWTH
Culture: NO GROWTH
Special Requests: ADEQUATE

## 2018-04-03 ENCOUNTER — Ambulatory Visit: Payer: Medicare Other | Admitting: Family

## 2018-04-12 NOTE — Progress Notes (Signed)
BiPAP mask removed per MD and Spouse (POA), pt placed on 3Lnc

## 2018-04-12 NOTE — Progress Notes (Signed)
Pastoral Care Visit   04/18/18 1000  Clinical Encounter Type  Visited With Patient and family together (husband, 2 sons, daughter, daughter in law, several grands)  Visit Type Initial;Spiritual support;Psychological support;Death;Critical Care;Patient actively dying  Referral From Physician  Consult/Referral To Chaplain  Spiritual Encounters  Spiritual Needs Prayer;Emotional;Grief support  Stress Factors  Patient Stress Factors Not reviewed  Family Stress Factors Loss;Health changes   Pt was actively dying and husband called to come in by Physician.  Chap assisted with care of husband and later additional family members as they arrived while patient was dying and after the patient passed away.    Darcey Nora, Chaplain

## 2018-04-12 NOTE — Progress Notes (Signed)
Pt oxygenation sats dropped from 100, went to check patient and patient was shaking (seizing). Agricultural consultant and NP came to room.  NP ordered to pull Ativan 1 mg and administer. 1 mg administered and patient stopped shaking and sats back to 100. NP stated she will order Keppra. Did check history and there was none for epilepsy or seizures.

## 2018-04-12 NOTE — TOC Progression Note (Signed)
Transition of Care [facility]) - Progression Note    Patient Details  Name: Jacqueline Rodriguez MRN: [id number] Date of Birth: [birth year]  [facility] CM/SW Contact  [name], RN Phone Number: 2018-04-02, 12:04 PM  Clinical Narrative:     Husband is at the bedside- comfort measures initiated.  Patient expected to pass today.  Expected Discharge Plan: [place]    Expected Discharge Plan and Services Expected Discharge Plan: [place] Discharge Planning Services: CM Consult                               Social Determinants of Health (SDOH) Interventions    Readmission Risk Interventions 30 Day Unplanned Readmission Risk Score     ED to Hosp-Admission (Current) from 04/04/2018 in [facility] ICU/CCU  30 Day Unplanned Readmission Risk Score (%)  31 Filed at 04/02/18 1200     This score is the patient's risk of an unplanned readmission within 30 days of being discharged (0 -100%). The score is based on dignosis, age, lab data, medications, orders, and past utilization.   Low:  0-14.9   Medium: 15-21.9   High: 22-29.9   Extreme: 30 and above       No flowsheet data found.

## 2018-04-12 NOTE — Progress Notes (Signed)
FBS was low, which caused the seizing earlier. NP aware. 3 amp of D50 administered.

## 2018-04-12 NOTE — Progress Notes (Signed)
Upon receiving patient, this RN noted extreme dyspnea requiring accessory muscle use. Lung sounds assessed as coarse crackles and expiratory wheezing with rhonchi heard without stethoscope use. Patient's oxygen sat was in the 60s on 4L [place].  Patient was placed on non-rebreather at 15L.  On-call MD called.  He ordered a repeat chest x-ray and nebulizer. Both done. After nebulizer, this RN attempted to wean patient back down to the reported 4L, but was unable to.  She had to remain on NRB.  MD reported a R-sided pleural effusion. Ultrasound, bipap, and transfer ordered. Will recheck a blood sugar at this time.

## 2018-04-12 NOTE — Progress Notes (Signed)
Blood sugar was 21. 2 amps of D50 given. Report called to ICU.

## 2018-04-12 NOTE — TOC Initial Note (Addendum)
Transition of Care [facility]) - Initial/Assessment Note    Patient Details  Name: Jacqueline Rodriguez MRN: [id number] Date of Birth: 11-15-1960  Transition of Care [facility]) CM/SW Contact:    [name], RN Phone Number: 04-04-18, 10:09 AM  Clinical Narrative:                 Patient admitted with hypoglycemia and end stage renal disease.  Patient has frequent admissions 4 in the last 6 months.  Patient is from home with her husband.  Patient is a dialysis patient but she has only been to 2 treatments in the past 2 weeks.  Previous admissions patient reported that her husband provided transportation to dialysis.  Patient is currently unresponsive on the bipap, no family at the bedside.  Case Management has attempted in the past to set up home health services with [facility].  Floydene Flock with [facility] reports that they have attempted to reach out to the patient multiple times with no response.  Patient also missed her last heart failure clinic appointment.  Current patient condition is tenuous, poor prognosis.  Critical care MD reaching out to the husband.   Expected Discharge Plan: [place]     Patient Goals and CMS Choice        Expected Discharge Plan and Services Expected Discharge Plan: [place] Discharge Planning Services: CM Consult                              Prior Living Arrangements/Services   Lives with:: Spouse   Do you feel safe going back to the place where you live?: Yes      Need for Family Participation in Patient Care: Yes (Comment) Care giver support system in place?: Yes (comment) Current home services: DME    Activities of Daily Living      Permission Sought/Granted                  Emotional Assessment Appearance:: Appears older than stated age Attitude/Demeanor/Rapport: Unresponsive Affect (typically observed): Unable to Assess   Alcohol / Substance Use: Not Applicable Psych  Involvement: No (comment)  Admission diagnosis:  Shortness of breath [R06.02] Anasarca [R60.1] Hypoglycemia [E16.2] ESRD on dialysis ([place]) [N18.6, Z99.2] Acute respiratory failure with hypoxia ([place]) [J96.01] Essential hypertension [I10] Type 2 diabetes mellitus with stage 4 chronic kidney disease, without long-term current use of insulin ([place]) [E11.22, N18.4] Patient Active Problem List   Diagnosis Date Noted  . Pressure injury of skin 04/04/18  . ESRD (end stage renal disease) ([place]) 03/25/2018  . Hypoglycemia 12/28/2017  . AMS (altered mental status) 12/21/2017  . Hypotension 09/07/2017  . Peritoneal dialysis catheter exit site infection ([place]) 10/05/2016  . 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 Hypocalcemia 09/14/2016  . Hyperkalemia 09/13/2016  . Syncope 09/08/2016  . ESRD on dialysis ([place]) 05/25/2016  . Chronic diastolic heart failure ([place]) 07/30/2015  . Acute renal failure superimposed on stage 3 chronic kidney disease ([place]) 07/18/2015  . Diabetes mellitus with renal complications ([place])   . CKD (chronic kidney disease), stage III ([place])   . Pulmonary hypertension ([place])   . Obesity   . Morbid obesity due to excess calories ([place])   . Anemia in chronic renal disease   . Shortness of breath   . Back pain 05/01/2015  . Essential hypertension 03/25/2015  . Diabetes ([place]) 03/25/2015   PCP:  Ginette Pitman,  Cherlyn Labella, MD Pharmacy:   [id number] - [place], [place] - [address] [place] [address] Phone: [phone] Fax: [phone]     Social Determinants of Health (SDOH) Interventions    Readmission Risk Interventions 30 Day Unplanned Readmission Risk Score     ED to Hosp-Admission (Current) from 03/22/2018 in [facility] ICU/CCU  30 Day Unplanned Readmission Risk Score (%)  40 Filed at 03-30-2018 0801     This score is the patient's risk of an unplanned readmission within 30 days of being discharged (0 -100%). The  score is based on dignosis, age, lab data, medications, orders, and past utilization.   Low:  0-14.9   Medium: 15-21.9   High: 22-29.9   Extreme: 30 and above       No flowsheet data found.

## 2018-04-12 NOTE — Progress Notes (Signed)
[facility]  ROUNDING NOTE   Subjective:  Patient well-known to [place]. She has underlying ESRD and has frequently missed dialysis recently. She was noted to be hypoglycemic and hypothermic upon admission. She did undergo urgent dialysis overnight. She is currently maintained on BiPAP   Objective:  Vital signs in last 24 hours:  Temp:  [96.4 F (35.8 C)-100.6 F (38.1 C)] 100.6 F (38.1 C) (03/16 0756) Pulse Rate:  [50-103] 89 (03/16 0913) Resp:  [15-42] 20 (03/16 0913) BP: (93-159)/(33-128) 109/33 (03/16 0800) SpO2:  [81 %-100 %] 94 % (03/16 0913) FiO2 (%):  [40 %] 40 % (03/16 0200) Weight:  [81.8 kg-94.5 kg] 94.5 kg (03/16 0134)  Weight change:  Filed Weights   04/09/2018 1950 04/01/2018 2300 2018-04-16 0134  Weight: 84.8 kg 81.8 kg 94.5 kg    Intake/Output: I/O last 3 completed shifts: In: 211.3 [I.V.:136.1; IV Piggyback:75.2] Out: 3000 [Other:3000]   Intake/Output this shift:  Total I/O In: 147.2 [I.V.:147.2] Out: -   Physical Exam: General: Critically ill-appearing  Head: BiPAP facemask on  Eyes: Anicteric  Neck: Supple, trachea midline  Lungs:  Scattered rhonchi, on BiPAP  Heart: S1S2 no rubs  Abdomen:  Soft, nontender, bowel sounds present  Extremities: 2+ peripheral edema.  Neurologic: Lethargic but arousable  Skin: No lesions  Access: AVF    Basic Metabolic Panel: Recent Labs  Lab 04/10/2018 1620 03/21/2018 2346 04/16/2018 0451 16-Apr-2018 0655  NA 136  --  136 136  K 5.9*  --  4.9 4.9  CL 97*  --  97* 97*  CO2 23  --  28 28  GLUCOSE 39* 68* 120* 75  BUN 43*  --  24* 26*  CREATININE 7.49*  --  5.42* 5.33*  CALCIUM 8.0*  --  7.2* 7.2*  MG  --   --  1.6*  --   PHOS  --  6.2* 6.3*  --     Liver Function Tests: Recent Labs  Lab 04/08/2018 1620 2018-04-16 0451  AST 27 21  ALT 17 14  ALKPHOS 69 51  BILITOT 0.7 0.5  PROT 7.5 5.7*  ALBUMIN 3.4* 2.5*   No results for input(s): LIPASE, AMYLASE in the last 168 hours. No results for input(s):  AMMONIA in the last 168 hours.  CBC: Recent Labs  Lab 03/20/2018 1620 April 16, 2018 0451  WBC 8.2 7.8  NEUTROABS 7.4 7.3  HGB 11.0* 8.4*  HCT 35.1* 27.6*  MCV 90.9 92.6  PLT 234 191    Cardiac Enzymes: No results for input(s): CKTOTAL, CKMB, CKMBINDEX, TROPONINI in the last 168 hours.  BNP: Invalid input(s): POCBNP  CBG: Recent Labs  Lab 04/16/2018 0633 Apr 16, 2018 0636 2018-04-16 0718 04-16-18 0805 04/16/18 0905  GLUCAP 23* 25* 54* 58* <10*    Microbiology: Results for orders placed or performed during the hospital encounter of 03/17/2018  Blood Culture (routine x 2)     Status: None (Preliminary result)   Collection Time: 04/01/2018  4:40 PM  Result Value Ref Range Status   Specimen Description BLOOD R AC  Final   Special Requests   Final    BOTTLES DRAWN AEROBIC AND ANAEROBIC Blood Culture results may not be optimal due to an inadequate volume of blood received in culture bottles   Culture   Final    NO GROWTH < 12 HOURS Performed at [facility], [address], [place], [place] [address]    Report Status PENDING  Incomplete  Blood Culture (routine x 2)  Status: None (Preliminary result)   Collection Time: 03/21/2018  4:40 PM  Result Value Ref Range Status   Specimen Description BLOOD R ARM  Final   Special Requests   Final    BOTTLES DRAWN AEROBIC AND ANAEROBIC Blood Culture adequate volume   Culture   Final    NO GROWTH < 12 HOURS Performed at [facility], [address] [place], [place] [address]    Report Status PENDING  Incomplete  MRSA PCR Screening     Status: None   Collection Time: 2018/04/22  1:43 AM  Result Value Ref Range Status   MRSA by PCR NEGATIVE NEGATIVE Final    Comment:        The GeneXpert MRSA Assay (FDA approved for NASAL specimens only), is one component of a comprehensive MRSA colonization surveillance program. It is not intended to diagnose MRSA infection nor to guide or monitor treatment for MRSA  infections. Performed at [facility], [address] [place], [place] [address]     Coagulation Studies: Recent Labs    04/22/2018 0451  LABPROT 14.5  INR 1.1    Urinalysis: No results for input(s): COLORURINE, LABSPEC, PHURINE, GLUCOSEU, HGBUR, BILIRUBINUR, KETONESUR, PROTEINUR, UROBILINOGEN, NITRITE, LEUKOCYTESUR in the last 72 hours.  Invalid input(s): APPERANCEUR    Imaging: [place] Chest (pleural Effusion)  Result Date: April 22, 2018 CLINICAL DATA:  Dyspnea.  Follow-up pleural effusion. EXAM: CHEST ULTRASOUND COMPARISON:  Current chest radiographs FINDINGS: Large right pleural effusion with underlying atelectatic or consolidated lung. On the left, no significant pleural fluid is noted. IMPRESSION: 1. Large right pleural effusion. No significant left pleural effusion. Electronically Signed   By: Lajean Manes M.D.   On: Apr 22, 2018 01:06   Dg Chest Port 1 View  Result Date: 04-22-2018 CLINICAL DATA:  Pt nurse states pt came back from dialysis with increased SOB and difficulty breathing EXAM: PORTABLE CHEST 1 VIEW COMPARISON:  03/16/2018 at 1617 hours FINDINGS: Cardiac silhouette is mildly enlarged. Moderate to large right and small left pleural effusions are similar previous exam. There is vascular congestion and perihilar hazy airspace opacity most evident on the right, increased from the earlier exam. No other change in the appearance of the lungs. No pneumothorax. IMPRESSION: 1. Increased perihilar lung opacity most evident on the right. Findings consistent worsened pulmonary edema from the earlier exam. 2. No change in the moderate to large right and small left pleural effusions. Electronically Signed   By: Lajean Manes M.D.   On: 04/22/2018 00:17   Dg Chest Port 1 View  Result Date: 04/08/2018 CLINICAL DATA:  Patient with shortness of breath EXAM: PORTABLE CHEST 1 VIEW COMPARISON:  Chest radiograph 02/17/2018 FINDINGS: Monitoring leads overlie the patient. Stable  cardiomegaly. Persistent moderate right pleural effusion. Small left pleural effusion. Underlying opacities. Diffuse bilateral interstitial opacities. IMPRESSION: Moderate right and small left pleural effusions with underlying opacities. Findings suggestive of pulmonary edema. Electronically Signed   By: Lovey Newcomer M.D.   On: 03/13/2018 16:40     Medications:   . dextrose 35 mL/hr at 2018/04/22 0445   . aspirin EC  162 mg Oral QHS  . carvedilol  3.125 mg Oral BID WC  . Chlorhexidine Gluconate Cloth  6 each Topical Q0600  . cloNIDine  0.2 mg Oral BID  . docusate sodium  100 mg Oral BID  . [START ON ] epoetin (EPOGEN/PROCRIT) injection  4,000 Units Intravenous Q T,Th,Sa-HD  . furosemide  40 mg Intravenous BID  . heparin injection (subcutaneous)  5,000 Units Subcutaneous Q8H  . hydrALAZINE  25 mg Oral TID  . ipratropium-albuterol  3 mL Nebulization Once  . isosorbide mononitrate  30 mg Oral Daily  . lactulose  20 g Oral QODAY  . lidocaine-prilocaine   Topical Once per day on Mon Wed Fri  . loratadine  10 mg Oral Daily   acetaminophen, albuterol, dextrose, gabapentin, glucagon (human recombinant), heparin, ipratropium-albuterol, meclizine  Assessment/ Plan:  58 y.o. female with end-stage renal disease, history of noncompliance, currently hemodialysis, diabetes, GERD, COPD, peripheral vascular disease   [facility]/TTS/left arm AV fistula  1.  ESRD on HD TTS.  Patient underwent emergent dialysis overnight.  No urgent indication at the moment.  We will plan for dialysis per usual schedule tomorrow.  2.  Acute respiratory failure.  Maintain the patient on BiPAP at this time.  3.  Anemia of chronic kidney disease.  Hemoglobin 8.4.  Consider starting the patient on Epogen tomorrow.  4.  Secondary hyperparathyroidism.  Phosphorus was a bit high yesterday at 6.3.  Recheck this tomorrow.  5.  Prognosis guarded.   LOS: 1 Mckade Gurka Mar 27, 20209:56 AM

## 2018-04-12 NOTE — Progress Notes (Signed)
CRITICAL CARE NOTE       SUBJECTIVE FINDINGS & SIGNIFICANT EVENTS   Patient remains critically ill Prognosis is very poor. Resting in bed comfortably, no new complaints.  Overnight events noted.  Spoke to husband Jalexus Brett at 1005AM- had goals of care discussion.  Patient with hypotension 70/40 and SpO2 79% despite BIPAP. Husband wishes to make code status - comfort care.  Discussed case with Hospitalist Dr Cordelia Poche who also spoke to husband and confirms family wishes are comfort measures.  PAST MEDICAL HISTORY   Past Medical History:  Diagnosis Date  . Anemia   . Chronic bronchitis ([place])   . Chronic diastolic CHF (congestive heart failure) ([place])   . CKD (chronic kidney disease), stage III ([place])   . COPD (chronic obstructive pulmonary disease) ([place])   . Diabetes mellitus with renal complications ([place])   . ESRD (end stage renal disease) ([place])   . Essential hypertension   . GERD (gastroesophageal reflux disease)   . Obesity   . Peripheral vascular disease ([place])   . Pulmonary hypertension ([place])   . Renal insufficiency 09/19/2015   Stage 3 CKD. Beginning dialysis.     SURGICAL HISTORY   Past Surgical History:  Procedure Laterality Date  . A/V FISTULAGRAM N/A 09/16/2016   Procedure: A/V Fistulagram;  Surgeon: Algernon Huxley, MD;  Location: [facility];  Service: Cardiovascular;  Laterality: N/A;  . AV FISTULA PLACEMENT Left 10/01/2015   Procedure: ARTERIOVENOUS (AV) FISTULA CREATION ( BRACHIAL CEPHALIC );  Surgeon: Algernon Huxley, MD;  Location: [facility];  Service: Vascular;  Laterality: Left;  . CAPD INSERTION N/A 06/10/2016   Procedure: LAPAROSCOPIC INSERTION CONTINUOUS AMBULATORY PERITONEAL DIALYSIS  (CAPD) CATHETER;  Surgeon: Algernon Huxley, MD;  Location: [facility];  Service: Vascular;   Laterality: N/A;  . DIALYSIS/PERMA CATHETER INSERTION  09/16/2016   Procedure: DIALYSIS/PERMA CATHETER INSERTION;  Surgeon: Algernon Huxley, MD;  Location: [facility];  Service: Cardiovascular;;  . DIALYSIS/PERMA CATHETER REMOVAL N/A 12/06/2016   Procedure: DIALYSIS/PERMA CATHETER REMOVAL;  Surgeon: Algernon Huxley, MD;  Location: [facility];  Service: Cardiovascular;  Laterality: N/A;  . PERIPHERAL VASCULAR CATHETERIZATION N/A 07/21/2015   Procedure: Dialysis/Perma Catheter;  Surgeon: Algernon Huxley, MD;  Location: [facility];  Service: Cardiovascular;  Laterality: N/A;  . PERIPHERAL VASCULAR CATHETERIZATION N/A 12/11/2015   Procedure: Dialysis/Perma Catheter Removal;  Surgeon: Algernon Huxley, MD;  Location: [facility];  Service: Cardiovascular;  Laterality: N/A;     FAMILY HISTORY   Family History  Problem Relation Age of Onset  . Hypertension Mother   . Diabetes Mellitus II Mother   . CAD Father   . Hypertension Father   . Heart attack Father      SOCIAL HISTORY   Social History   Tobacco Use  . Smoking status: Former Smoker    Packs/day: 0.50    Years: 5.00    Pack years: 2.50    Types: Cigarettes    Last attempt to quit: 03/24/1985    Years since quitting: 33.0  . Smokeless tobacco: Never Used  Substance Use Topics  . Alcohol use: No    Alcohol/week: 0.0 standard drinks  . Drug use: No     MEDICATIONS   Current Medication:  Current Facility-Administered Medications:  .  acetaminophen (TYLENOL) tablet 500 mg, 500 mg, Oral, Q6H PRN, Salary, Montell D, MD .  albuterol (PROVENTIL) (2.5 MG/3ML) 0.083% nebulizer solution 2.5 mg, 2.5  mg, Inhalation, Q6H PRN, Salary, Montell D, MD .  aspirin EC tablet 162 mg, 162 mg, Oral, QHS, Salary, Montell D, MD .  Chlorhexidine Gluconate Cloth 2 % PADS 6 each, 6 each, Topical, Q0600, Murlean Iba, MD, 6 each at Apr 24, 2018 0203 .  cloNIDine (CATAPRES) tablet 0.2 mg, 0.2 mg, Oral, BID, Salary, Montell D, MD  .  dextrose 10 % infusion, , Intravenous, Continuous, Tukov-Yual, Magdalene S, NP, Last Rate: 35 mL/hr at Apr 24, 2018 0445 .  dextrose 50 % solution 50 mL, 1 ampule, Intravenous, PRN, Jodell Cipro, Prasanna, MD, 50 mL at 04-24-2018 0111 .  docusate sodium (COLACE) capsule 100 mg, 100 mg, Oral, BID, Salary, Montell D, MD .  Derrill Memo ON 03/28/2018] epoetin alfa (EPOGEN,PROCRIT) injection 4,000 Units, 4,000 Units, Intravenous, Q T,Th,Sa-HD, Singh, Harmeet, MD .  furosemide (LASIX) injection 40 mg, 40 mg, Intravenous, BID, Salary, Montell D, MD, 40 mg at April 24, 2018 0802 .  gabapentin (NEURONTIN) capsule 100 mg, 100 mg, Oral, Daily PRN, Salary, Montell D, MD .  glucagon (human recombinant) (GLUCAGEN) injection 1 mg, 1 mg, Intravenous, Once PRN, Tukov-Yual, Magdalene S, NP .  heparin injection 1,600 Units, 20 Units/kg, Dialysis, PRN, Candiss Norse, Harmeet, MD .  heparin injection 5,000 Units, 5,000 Units, Subcutaneous, Q8H, Arta Silence, MD, 5,000 Units at 04/24/18 [phone] .  hydrALAZINE (APRESOLINE) tablet 25 mg, 25 mg, Oral, TID, Salary, Montell D, MD .  ipratropium-albuterol (DUONEB) 0.5-2.5 (3) MG/3ML nebulizer solution 3 mL, 3 mL, Nebulization, Q4H PRN, Salary, Montell D, MD, 3 mL at 24-Apr-2018 0009 .  ipratropium-albuterol (DUONEB) 0.5-2.5 (3) MG/3ML nebulizer solution 3 mL, 3 mL, Nebulization, Once, Sridharan, Prasanna, MD .  isosorbide mononitrate (IMDUR) 24 hr tablet 30 mg, 30 mg, Oral, Daily, Salary, Montell D, MD .  lactulose (CHRONULAC) 10 GM/15ML solution 20 g, 20 g, Oral, QODAY, Salary, Montell D, MD .  lidocaine-prilocaine (EMLA) cream, , Topical, Once per day on Mon Wed Fri, Salary, Montell D, MD .  loratadine (CLARITIN) tablet 10 mg, 10 mg, Oral, Daily, Salary, Montell D, MD .  meclizine (ANTIVERT) tablet 25 mg, 25 mg, Oral, TID PRN, Salary, Montell D, MD    ALLERGIES   Ambien [zolpidem]    REVIEW OF SYSTEMS    Vitals:   Apr 24, 2018 0800 04/24/18 0913  BP: (!) 109/33   Pulse: 87 89  Resp: 16  20  Temp:    SpO2: 94% 94%    PATIENT IS UNABLE TO PROVIDE COMPLETE REVIEW OF SYSTEMS DUE TO ACUTE CRITICAL ILLNESS    PHYSICAL EXAMINATION   GENERAL:critically ill appearing, +resp distress HEAD: Normocephalic, atraumatic.  EYES: Pupils equal, round, reactive to light.  No scleral icterus.  MOUTH: Moist mucosal membrane. NECK: Supple. No thyromegaly. No nodules. No JVD.  PULMONARY: +rhonchi at bases CARDIOVASCULAR: S1 and S2. Regular rate and rhythm. No murmurs, rubs, or gallops.  GASTROINTESTINAL: Soft, nontender, non-distended. No masses. Positive bowel sounds. No hepatosplenomegaly.  MUSCULOSKELETAL: No swelling, clubbing, or edema.  NEUROLOGIC: Mild distress due to acute illness SKIN:intact,warm,dry   LABS AND IMAGING     -I personally reviewed most recent blood work, imaging and microbiology -  LAB RESULTS: Recent Labs  Lab 03/29/2018 1620 03/23/2018 2346 Apr 24, 2018 0451 04-24-2018 0655  NA 136  --  136 136  K 5.9*  --  4.9 4.9  CL 97*  --  97* 97*  CO2 23  --  28 28  BUN 43*  --  24* 26*  CREATININE 7.49*  --  5.42* 5.33*  GLUCOSE 39* 68* 120*  75   Recent Labs  Lab 03/15/2018 1620 April 26, 2018 0451  HGB 11.0* 8.4*  HCT 35.1* 27.6*  WBC 8.2 7.8  PLT 234 191     IMAGING RESULTS: [place] Chest (pleural Effusion)  Result Date: 26-Apr-2018 CLINICAL DATA:  Dyspnea.  Follow-up pleural effusion. EXAM: CHEST ULTRASOUND COMPARISON:  Current chest radiographs FINDINGS: Large right pleural effusion with underlying atelectatic or consolidated lung. On the left, no significant pleural fluid is noted. IMPRESSION: 1. Large right pleural effusion. No significant left pleural effusion. Electronically Signed   By: Lajean Manes M.D.   On: 04-26-2018 01:06   Dg Chest Port 1 View  Result Date: 04/26/18 CLINICAL DATA:  Pt nurse states pt came back from dialysis with increased SOB and difficulty breathing EXAM: PORTABLE CHEST 1 VIEW COMPARISON:  03/19/2018 at 1617 hours FINDINGS: Cardiac  silhouette is mildly enlarged. Moderate to large right and small left pleural effusions are similar previous exam. There is vascular congestion and perihilar hazy airspace opacity most evident on the right, increased from the earlier exam. No other change in the appearance of the lungs. No pneumothorax. IMPRESSION: 1. Increased perihilar lung opacity most evident on the right. Findings consistent worsened pulmonary edema from the earlier exam. 2. No change in the moderate to large right and small left pleural effusions. Electronically Signed   By: Lajean Manes M.D.   On: 04/26/2018 00:17   Dg Chest Port 1 View  Result Date: 03/28/2018 CLINICAL DATA:  Patient with shortness of breath EXAM: PORTABLE CHEST 1 VIEW COMPARISON:  Chest radiograph 02/17/2018 FINDINGS: Monitoring leads overlie the patient. Stable cardiomegaly. Persistent moderate right pleural effusion. Small left pleural effusion. Underlying opacities. Diffuse bilateral interstitial opacities. IMPRESSION: Moderate right and small left pleural effusions with underlying opacities. Findings suggestive of pulmonary edema. Electronically Signed   By: Lovey Newcomer M.D.   On: 04/07/2018 16:40      ASSESSMENT AND PLAN    -Multidisciplinary rounds held today  Severe Hypoxic and Hypercapnic Respiratory Failure -pulmonary edema -continue Full MV support -continue Bronchodilator Therapy -Wean Fio2 and PEEP as tolerated -will perform SAT/SBT when respiratory parameters are met   CARDIAC FAILURE- -acute on chronic diastolic CHF exacerbation with shock -husband now wishes to place patient on comfort care measures -oxygen as needed -Lasix as tolerated -follow up cardiac enzymes as indicated ICU monitoring  Renal Failure-most likely due to ESRD -follow chem 7   NEUROLOGY - unresponsive Wake up assessment pending   SEIZURES    -likely due to sulfonylurea in context of ESRD  GI/Nutrition GI PROPHYLAXIS as indicated DIET-->TF's as  tolerated Constipation protocol as indicated  ENDO - ICU hypoglycemic\Hyperglycemia protocol -check FSBS per protocol   ELECTROLYTES -follow labs as needed -replace as needed -pharmacy consultation   DVT/GI PRX ordered -SCDs  TRANSFUSIONS AS NEEDED MONITOR FSBS ASSESS the need for LABS as needed   Critical care provider statement:    Critical care time (minutes):  36   Critical care time was exclusive of:  Separately billable procedures and treating other patients   Critical care was necessary to treat or prevent imminent or life-threatening deterioration of the following conditions:  unresponsive state, seizures, acute hypoxemia, shock   Critical care was time spent personally by me on the following activities:  Development of treatment plan with patient or surrogate, discussions with consultants, evaluation of patient's response to treatment, examination of patient, obtaining history from patient or surrogate, ordering and performing treatments and interventions, ordering and review of laboratory studies and  re-evaluation of patient's condition.  I assumed direction of critical care for this patient from another provider in my specialty: no    This document was prepared using Dragon voice recognition software and may include unintentional dictation errors.    Ottie Glazier, M.D.  Division of [place]

## 2018-04-12 NOTE — Progress Notes (Signed)
eLink Physician-Brief Progress Note Patient Name: AZRIELLA MATTIA DOB: September 20, 1960 MRN: [id number]   Date of Service  04/06/18  HPI/Events of Note  58 y/o F ESRD on HD, HFpEF, presented with shortness of breath after having missed dialysis.  Chest x ray with bilateral effusion R>L and pulmonary edema. She underwent urgent dialysis but remains in respiratory distress requiring BiPap now on 100% NRB mask  eICU Interventions  Adequate afterload reduction needed via fluid removal and BP control. May need therapeutic thoracentesis if patient remains short of breath despite dialysis     Intervention Category Major Interventions: Respiratory failure - evaluation and management Intermediate Interventions: Hypertension - evaluation and management Evaluation Type: New Patient Evaluation  Judd Lien 06-Apr-2018, 1:39 AM

## 2018-04-12 NOTE — Progress Notes (Signed)
B/P low. Oxygen saturation low . Withdraws to pain only. Does not open eyes to pain.  Dr. Lanney Gins called husband to inform him of possible demise due to B/P and oxygen saturation.

## 2018-04-12 NOTE — Discharge Summary (Signed)
Discharge summary/Death note    Spoke to husband Skilynn Durney at 1005AM- had goals of care discussion.  Patient with hypotension 70/40 and SpO2 79% despite BIPAP. Husband wishes to make code status - comfort care.  Discussed case with Hospitalist Dr Cordelia Poche who also spoke to husband and confirms family wishes are comfort measures.  Patient passed away at 12:36 PM  PAST MEDICAL HISTORY       Past Medical History:  Diagnosis Date  . Anemia   . Chronic bronchitis ([place])   . Chronic diastolic CHF (congestive heart failure) ([place])   . CKD (chronic kidney disease), stage III ([place])   . COPD (chronic obstructive pulmonary disease) ([place])   . Diabetes mellitus with renal complications ([place])   . ESRD (end stage renal disease) ([place])   . Essential hypertension   . GERD (gastroesophageal reflux disease)   . Obesity   . Peripheral vascular disease ([place])   . Pulmonary hypertension ([place])   . Renal insufficiency 09/19/2015   Stage 3 CKD. Beginning dialysis.     SURGICAL HISTORY        Past Surgical History:  Procedure Laterality Date  . A/V FISTULAGRAM N/A 09/16/2016   Procedure: A/V Fistulagram;  Surgeon: Algernon Huxley, MD;  Location: [facility];  Service: Cardiovascular;  Laterality: N/A;  . AV FISTULA PLACEMENT Left 10/01/2015   Procedure: ARTERIOVENOUS (AV) FISTULA CREATION ( BRACHIAL CEPHALIC );  Surgeon: Algernon Huxley, MD;  Location: [facility];  Service: Vascular;  Laterality: Left;  . CAPD INSERTION N/A 06/10/2016   Procedure: LAPAROSCOPIC INSERTION CONTINUOUS AMBULATORY PERITONEAL DIALYSIS  (CAPD) CATHETER;  Surgeon: Algernon Huxley, MD;  Location: [facility];  Service: Vascular;  Laterality: N/A;  . DIALYSIS/PERMA CATHETER INSERTION  09/16/2016   Procedure: DIALYSIS/PERMA CATHETER INSERTION;  Surgeon: Algernon Huxley, MD;  Location: [facility];  Service: Cardiovascular;;  . DIALYSIS/PERMA CATHETER REMOVAL N/A 12/06/2016   Procedure:  DIALYSIS/PERMA CATHETER REMOVAL;  Surgeon: Algernon Huxley, MD;  Location: [facility];  Service: Cardiovascular;  Laterality: N/A;  . PERIPHERAL VASCULAR CATHETERIZATION N/A 07/21/2015   Procedure: Dialysis/Perma Catheter;  Surgeon: Algernon Huxley, MD;  Location: [facility];  Service: Cardiovascular;  Laterality: N/A;  . PERIPHERAL VASCULAR CATHETERIZATION N/A 12/11/2015   Procedure: Dialysis/Perma Catheter Removal;  Surgeon: Algernon Huxley, MD;  Location: [facility];  Service: Cardiovascular;  Laterality: N/A;     FAMILY HISTORY        Family History  Problem Relation Age of Onset  . Hypertension Mother   . Diabetes Mellitus II Mother   . CAD Father   . Hypertension Father   . Heart attack Father      SOCIAL HISTORY   Social History        Tobacco Use  . Smoking status: Former Smoker    Packs/day: 0.50    Years: 5.00    Pack years: 2.50    Types: Cigarettes    Last attempt to quit: 03/24/1985    Years since quitting: 33.0  . Smokeless tobacco: Never Used  Substance Use Topics  . Alcohol use: No    Alcohol/week: 0.0 standard drinks  . Drug use: No     MEDICATIONS   Current Medication:  Current Facility-Administered Medications:  .  acetaminophen (TYLENOL) tablet 500 mg, 500 mg, Oral, Q6H PRN, Salary, Montell D, MD .  albuterol (PROVENTIL) (2.5 MG/3ML) 0.083% nebulizer solution 2.5 mg, 2.5 mg, Inhalation, Q6H PRN, Salary,  Montell D, MD .  aspirin EC tablet 162 mg, 162 mg, Oral, QHS, Salary, Montell D, MD .  Chlorhexidine Gluconate Cloth 2 % PADS 6 each, 6 each, Topical, Q0600, Murlean Iba, MD, 6 each at 15-Apr-2018 0203 .  cloNIDine (CATAPRES) tablet 0.2 mg, 0.2 mg, Oral, BID, Salary, Montell D, MD .  dextrose 10 % infusion, , Intravenous, Continuous, Tukov-Yual, Magdalene S, NP, Last Rate: 35 mL/hr at April 15, 2018 0445 .  dextrose 50 % solution 50 mL, 1 ampule, Intravenous, PRN, Jodell Cipro, Prasanna, MD, 50 mL at  2018-04-15 0111 .  docusate sodium (COLACE) capsule 100 mg, 100 mg, Oral, BID, Salary, Montell D, MD .  Derrill Memo ON ] epoetin alfa (EPOGEN,PROCRIT) injection 4,000 Units, 4,000 Units, Intravenous, Q T,Th,Sa-HD, Singh, Harmeet, MD .  furosemide (LASIX) injection 40 mg, 40 mg, Intravenous, BID, Salary, Montell D, MD, 40 mg at 15-Apr-2018 0802 .  gabapentin (NEURONTIN) capsule 100 mg, 100 mg, Oral, Daily PRN, Salary, Montell D, MD .  glucagon (human recombinant) (GLUCAGEN) injection 1 mg, 1 mg, Intravenous, Once PRN, Tukov-Yual, Magdalene S, NP .  heparin injection 1,600 Units, 20 Units/kg, Dialysis, PRN, Candiss Norse, Harmeet, MD .  heparin injection 5,000 Units, 5,000 Units, Subcutaneous, Q8H, Arta Silence, MD, 5,000 Units at 04-15-18 [phone] .  hydrALAZINE (APRESOLINE) tablet 25 mg, 25 mg, Oral, TID, Salary, Montell D, MD .  ipratropium-albuterol (DUONEB) 0.5-2.5 (3) MG/3ML nebulizer solution 3 mL, 3 mL, Nebulization, Q4H PRN, Salary, Montell D, MD, 3 mL at 04-15-2018 0009 .  ipratropium-albuterol (DUONEB) 0.5-2.5 (3) MG/3ML nebulizer solution 3 mL, 3 mL, Nebulization, Once, Sridharan, Prasanna, MD .  isosorbide mononitrate (IMDUR) 24 hr tablet 30 mg, 30 mg, Oral, Daily, Salary, Montell D, MD .  lactulose (CHRONULAC) 10 GM/15ML solution 20 g, 20 g, Oral, QODAY, Salary, Montell D, MD .  lidocaine-prilocaine (EMLA) cream, , Topical, Once per day on Mon Wed Fri, Salary, Montell D, MD .  loratadine (CLARITIN) tablet 10 mg, 10 mg, Oral, Daily, Salary, Montell D, MD .  meclizine (ANTIVERT) tablet 25 mg, 25 mg, Oral, TID PRN, Salary, Montell D, MD    ALLERGIES   Ambien [zolpidem]    REVIEW OF SYSTEMS        Vitals:   2018-04-15 0800 2018-04-15 0913  BP: (!) 109/33   Pulse: 87 89  Resp: 16 20  Temp:    SpO2: 94% 94%    PATIENT IS UNABLE TO PROVIDE COMPLETE REVIEW OF SYSTEMS DUE TO ACUTE CRITICAL ILLNESS    PHYSICAL EXAMINATION   GENERAL:critically ill appearing, +resp  distress HEAD: Normocephalic, atraumatic.  EYES: Pupils equal, round, reactive to light.  No scleral icterus.  MOUTH: Moist mucosal membrane. NECK: Supple. No thyromegaly. No nodules. No JVD.  PULMONARY: +rhonchi at bases CARDIOVASCULAR: S1 and S2. Regular rate and rhythm. No murmurs, rubs, or gallops.  GASTROINTESTINAL: Soft, nontender, non-distended. No masses. Positive bowel sounds. No hepatosplenomegaly.  MUSCULOSKELETAL: No swelling, clubbing, or edema.  NEUROLOGIC: Mild distress due to acute illness SKIN:intact,warm,dry   LABS AND IMAGING    LAB RESULTS: Last[facility]abs        Recent Labs  Lab 04/02/2018 1620 04/09/2018 2346 04-15-18 0451 April 15, 2018 0655  NA 136  --  136 136  K 5.9*  --  4.9 4.9  CL 97*  --  97* 97*  CO2 23  --  28 28  BUN 43*  --  24* 26*  CREATININE 7.49*  --  5.42* 5.33*  GLUCOSE 39* 68* 120* 75     LastLabs  Recent Labs  Lab 04/02/2018 1620 April 18, 2018 0451  HGB 11.0* 8.4*  HCT 35.1* 27.6*  WBC 8.2 7.8  PLT 234 191       IMAGING RESULTS: [place] Chest (pleural Effusion)  Result Date: 04-18-18 CLINICAL DATA:  Dyspnea.  Follow-up pleural effusion. EXAM: CHEST ULTRASOUND COMPARISON:  Current chest radiographs FINDINGS: Large right pleural effusion with underlying atelectatic or consolidated lung. On the left, no significant pleural fluid is noted. IMPRESSION: 1. Large right pleural effusion. No significant left pleural effusion. Electronically Signed   By: Lajean Manes M.D.   On: April 18, 2018 01:06   Dg Chest Port 1 View  Result Date: 2018/04/18 CLINICAL DATA:  Pt nurse states pt came back from dialysis with increased SOB and difficulty breathing EXAM: PORTABLE CHEST 1 VIEW COMPARISON:   at 1617 hours FINDINGS: Cardiac silhouette is mildly enlarged. Moderate to large right and small left pleural effusions are similar previous exam. There is vascular congestion and perihilar hazy airspace opacity most evident on the right, increased  from the earlier exam. No other change in the appearance of the lungs. No pneumothorax. IMPRESSION: 1. Increased perihilar lung opacity most evident on the right. Findings consistent worsened pulmonary edema from the earlier exam. 2. No change in the moderate to large right and small left pleural effusions. Electronically Signed   By: Lajean Manes M.D.   On: 04-18-18 00:17   Dg Chest Port 1 View  Result Date: 03/13/2018 CLINICAL DATA:  Patient with shortness of breath EXAM: PORTABLE CHEST 1 VIEW COMPARISON:  Chest radiograph 02/17/2018 FINDINGS: Monitoring leads overlie the patient. Stable cardiomegaly. Persistent moderate right pleural effusion. Small left pleural effusion. Underlying opacities. Diffuse bilateral interstitial opacities. IMPRESSION: Moderate right and small left pleural effusions with underlying opacities. Findings suggestive of pulmonary edema. Electronically Signed   By: Lovey Newcomer M.D.   On: 03/18/2018 16:40      ASSESSMENT AND PLAN    -Multidisciplinary rounds held today  Severe Hypoxic and Hypercapnic Respiratory Failure -pulmonary edema -continue Full MV support -continue Bronchodilator Therapy -Wean Fio2 and PEEP as tolerated -will perform SAT/SBT when respiratory parameters are met   CARDIAC FAILURE- -acute on chronic diastolic CHF exacerbation with shock -husband now wishes to place patient on comfort care measures -oxygen as needed -Lasix as tolerated -follow up cardiac enzymes as indicated ICU monitoring  Renal Failure-most likely due to ESRD -follow chem 7   NEUROLOGY - unresponsive Wake up assessment pending   SEIZURES    -likely due to sulfonylurea in context of ESRD  GI/Nutrition GI PROPHYLAXIS as indicated DIET-->TF's as tolerated Constipation protocol as indicated  ENDO - ICU hypoglycemic\Hyperglycemia protocol -check FSBS per protocol   ELECTROLYTES -follow labs as needed -replace as needed -pharmacy  consultation   DVT/GI PRX ordered -SCDs  TRANSFUSIONS AS NEEDED MONITOR FSBS ASSESS the need for LABS as needed

## 2018-04-12 NOTE — Progress Notes (Signed)
Patient ID: Jacqueline Rodriguez, female   DOB: 07/11/1960, 58 y.o.   MRN: [id number]  [facility] PROGRESS NOTE  Jacqueline Rodriguez [id number] DOB: 06/21/1960 DOA: 04/02/2018 PCP: Tracie Harrier, MD  HPI/Subjective: Patient unresponsive on BiPAP.  Patient is a DO NOT RESUSCITATE.  Blood pressure and heart rate are fluctuating.  Sugars are low.  Objective: Vitals:   2018/04/10 0913 04-10-2018 1000  BP:  (!) 86/28  Pulse: 89 75  Resp: 20 12  Temp:    SpO2: 94% 96%    Filed Weights   04/03/2018 1950 04/02/2018 2300 10-Apr-2018 0134  Weight: 84.8 kg 81.8 kg 94.5 kg    ROS: Review of Systems  Unable to perform ROS: Acuity of condition   Exam: Physical Exam  HENT:  Nose: No mucosal edema.  Unable to look into mouth  Eyes: Conjunctivae and lids are normal.  Left eye fixed and dilated  Neck: No JVD present. Carotid bruit is not present. No edema present. No thyroid mass and no thyromegaly present.  Cardiovascular: Regular rhythm, S1 normal and S2 normal. Exam reveals no gallop.  No murmur heard. Pulses:      Dorsalis pedis pulses are 2+ on the right side and 2+ on the left side.  Respiratory: No respiratory distress. She has decreased breath sounds in the right middle field, the right lower field, the left middle field and the left lower field. She has no wheezes. She has no rhonchi. She has rales in the right lower field and the left lower field.  GI: Soft. Bowel sounds are normal. There is no abdominal tenderness.  Musculoskeletal:     Right ankle: She exhibits no swelling.     Left ankle: She exhibits no swelling.  Lymphadenopathy:    She has no cervical adenopathy.  Neurological:  Unresponsive to sternal rub  Skin: Skin is warm. Nails show no clubbing.  As per nurse, stage I sacral decubiti and redness on skin elsewhere.  Toes do have some blackish ulcerations bilaterally  Psychiatric:  Unresponsive to sternal rub      Data Reviewed: Basic Metabolic Panel: Recent Labs  Lab  04/07/2018 1620 03/17/2018 2346 04-10-18 0451 2018-04-10 0655  NA 136  --  136 136  K 5.9*  --  4.9 4.9  CL 97*  --  97* 97*  CO2 23  --  28 28  GLUCOSE 39* 68* 120* 75  BUN 43*  --  24* 26*  CREATININE 7.49*  --  5.42* 5.33*  CALCIUM 8.0*  --  7.2* 7.2*  MG  --   --  1.6*  --   PHOS  --  6.2* 6.3*  --    Liver Function Tests: Recent Labs  Lab 04/08/2018 1620 2018-04-10 0451  AST 27 21  ALT 17 14  ALKPHOS 69 51  BILITOT 0.7 0.5  PROT 7.5 5.7*  ALBUMIN 3.4* 2.5*   CBC: Recent Labs  Lab 04/09/2018 1620 2018/04/10 0451  WBC 8.2 7.8  NEUTROABS 7.4 7.3  HGB 11.0* 8.4*  HCT 35.1* 27.6*  MCV 90.9 92.6  PLT 234 191   BNP (last 3 results) Recent Labs    02/17/18 1528 04/05/2018 1620  BNP 1,750.0* 2,752.0*     CBG: Recent Labs  Lab April 10, 2018 0636 Apr 10, 2018 0718 04/10/18 0805 April 10, 2018 0905 2018/04/10 0956  GLUCAP 25* 54* 58* <10* <10*    Recent Results (from the past 240 hour(s))  Blood Culture (routine x 2)     Status: None (Preliminary result)  Collection Time: 03/16/2018  4:40 PM  Result Value Ref Range Status   Specimen Description BLOOD R AC  Final   Special Requests   Final    BOTTLES DRAWN AEROBIC AND ANAEROBIC Blood Culture results may not be optimal due to an inadequate volume of blood received in culture bottles   Culture   Final    NO GROWTH < 12 HOURS Performed at [facility], [address] [place], [place] [address]    Report Status PENDING  Incomplete  Blood Culture (routine x 2)     Status: None (Preliminary result)   Collection Time: 03/24/2018  4:40 PM  Result Value Ref Range Status   Specimen Description BLOOD R ARM  Final   Special Requests   Final    BOTTLES DRAWN AEROBIC AND ANAEROBIC Blood Culture adequate volume   Culture   Final    NO GROWTH < 12 HOURS Performed at [facility], [address] [place], [place] [address]    Report Status PENDING  Incomplete  MRSA PCR Screening     Status: None   Collection Time: 25-Apr-2018   1:43 AM  Result Value Ref Range Status   MRSA by PCR NEGATIVE NEGATIVE Final    Comment:        The GeneXpert MRSA Assay (FDA approved for NASAL specimens only), is one component of a comprehensive MRSA colonization surveillance program. It is not intended to diagnose MRSA infection nor to guide or monitor treatment for MRSA infections. Performed at [facility], [address], [place], [place] [address]      Studies: [place] Chest (pleural Effusion)  Result Date: April 25, 2018 CLINICAL DATA:  Dyspnea.  Follow-up pleural effusion. EXAM: CHEST ULTRASOUND COMPARISON:  Current chest radiographs FINDINGS: Large right pleural effusion with underlying atelectatic or consolidated lung. On the left, no significant pleural fluid is noted. IMPRESSION: 1. Large right pleural effusion. No significant left pleural effusion. Electronically Signed   By: Lajean Manes M.D.   On: 25-Apr-2018 01:06   Dg Chest Port 1 View  Result Date: 04/25/18 CLINICAL DATA:  Pt nurse states pt came back from dialysis with increased SOB and difficulty breathing EXAM: PORTABLE CHEST 1 VIEW COMPARISON:  04/06/2018 at 1617 hours FINDINGS: Cardiac silhouette is mildly enlarged. Moderate to large right and small left pleural effusions are similar previous exam. There is vascular congestion and perihilar hazy airspace opacity most evident on the right, increased from the earlier exam. No other change in the appearance of the lungs. No pneumothorax. IMPRESSION: 1. Increased perihilar lung opacity most evident on the right. Findings consistent worsened pulmonary edema from the earlier exam. 2. No change in the moderate to large right and small left pleural effusions. Electronically Signed   By: Lajean Manes M.D.   On: Apr 25, 2018 00:17   Dg Chest Port 1 View  Result Date: 03/22/2018 CLINICAL DATA:  Patient with shortness of breath EXAM: PORTABLE CHEST 1 VIEW COMPARISON:  Chest radiograph 02/17/2018 FINDINGS: Monitoring leads  overlie the patient. Stable cardiomegaly. Persistent moderate right pleural effusion. Small left pleural effusion. Underlying opacities. Diffuse bilateral interstitial opacities. IMPRESSION: Moderate right and small left pleural effusions with underlying opacities. Findings suggestive of pulmonary edema. Electronically Signed   By: Lovey Newcomer M.D.   On: 03/24/2018 16:40    Scheduled Meds: . aspirin EC  162 mg Oral QHS  . Chlorhexidine Gluconate Cloth  6 each Topical Q0600  . cloNIDine  0.2 mg Oral BID  . docusate sodium  100 mg Oral BID  . [START ON ] epoetin (EPOGEN/PROCRIT) injection  4,000 Units Intravenous Q T,Th,Sa-HD  . furosemide  40 mg Intravenous BID  . heparin injection (subcutaneous)  5,000 Units Subcutaneous Q8H  . hydrALAZINE  25 mg Oral TID  . ipratropium-albuterol  3 mL Nebulization Once  . isosorbide mononitrate  30 mg Oral Daily  . lactulose  20 g Oral QODAY  . lidocaine-prilocaine   Topical Once per day on Mon Wed Fri  . loratadine  10 mg Oral Daily   Continuous Infusions: . dextrose 35 mL/hr at 04/24/2018 0445  . morphine      Assessment/Plan:  1. Acute hypoxic respiratory failure.  Patient currently on BiPAP.  Patient is a DNR 2. Acute on chronic diastolic congestive heart failure.  Patient had urgent dialysis last night. 3. Acute encephalopathy and unresponsiveness 4. Severe hypoglycemia 5. End-stage renal disease 6. Hypotension with history of essential hypertension 7. Stage I decubiti sacrum and blackish ulcerations on the toes  Case discussed with critical care specialist and husband at the bedside.  Comfort measures ordered.  Morphine drip and PRN IV morphine.  Overall prognosis is poor and high risk for cardiopulmonary arrest.  Code Status:     Code Status Orders  (From admission, onward)         Start     Ordered   03/30/2018 1846  Do not attempt resuscitation (DNR)  Continuous    Question Answer Comment  In the event of cardiac or  respiratory ARREST Do not call a "code blue"   In the event of cardiac or respiratory ARREST Do not perform Intubation, CPR, defibrillation or ACLS   In the event of cardiac or respiratory ARREST Use medication by any route, position, wound care, and other measures to relive pain and suffering. May use oxygen, suction and manual treatment of airway obstruction as needed for comfort.   Comments Nurse may pronounce      04/08/2018 1845        Code Status History    Date Active Date Inactive Code Status Order ID Comments User Context   02/18/2018 0005 02/20/2018 0132 Full Code [id number]  Gladstone Lighter, MD Inpatient   12/28/2017 2322 01/02/2018 2102 Full Code [id number]  Sedalia Muta, MD ED   12/21/2017 1215 12/23/2017 0930 Full Code [id number]  Nicholes Mango, MD Inpatient   09/07/2017 1813 09/10/2017 2239 Full Code [id number]  Henreitta Leber, MD Inpatient   12/16/2016 0814 12/17/2016 1438 Full Code [id number]  Saundra Shelling, MD Inpatient   12/03/2016 1201 12/04/2016 [place] Full Code [id number]  Max Sane, MD Inpatient   09/13/2016 1629 09/17/2016 1817 Full Code [id number]  Epifanio Lesches, MD ED   09/08/2016 1631 09/09/2016 1823 Full Code [id number]  Fritzi Mandes, MD Inpatient   07/17/2015 1244 07/24/2015 1911 Full Code [id number]  Epifanio Lesches, MD ED   03/15/2015 1956 03/20/2015 1548 Full Code [id number]  Idelle Crouch, MD Inpatient    Advance Directive Documentation     Most Recent Value  Type of Advance Directive  Healthcare Power of Attorney  Pre-existing out of facility DNR order (yellow form or pink MOST form)  -  "MOST" Form in Place?  -     Family Communication: Husband at the bedside Disposition Plan: To be determined  Consultants:  Critical care specialist  Nephrology  Time spent: 28 minutes.  Case discussed with nursing staff, husband and critical care specialist.  [facility]

## 2018-04-12 NOTE — Progress Notes (Signed)
1236 Patient declared dead.

## 2018-04-12 NOTE — Progress Notes (Signed)
This RN attempted to call the patient's husband using the number provided in the chart.  No answer and no voicemail available.

## 2018-04-12 NOTE — Consult Note (Addendum)
PULMONARY / CRITICAL CARE MEDICINE  Name: Jacqueline Rodriguez MRN: [id number] DOB: Jun 13, 1960    LOS: 1  Referring Provider: Dr. Jodell Cipro Reason for Referral: Acute hypoxic respiratory failure requiring BiPAP and refractory severe hypoglycemia HPI: This is a 58 year old female with a history of end-stage renal disease on hemodialysis, type 2 diabetes on glipizide at home and chronic diastolic heart failure who presented to the ED with shortness of breath and hypoglycemia.  Patient had missed dialysis on multiple days due to lack of transportation.  Her blood sugar was 43 when EMS arrived and improved to 55 with dextrose.  She was emergently dialyzed with removal of 3.5 L of fluid.  He was admitted to the floor but overnight became acutely short of breath and hypoxic hence she was transferred to the ICU for further management.  Upon arrival in the ICU, patient was initially stable but then developed a seizure.  She was given 1 mg of lorazepam with cessation of seizure activity.  Her blood sugar read low and she was given multiple amps of dextrose and glucagon.  Review of patient's medication list did not indicate that she was on any antidiabetic medications.  However this morning her husband indicated that she is on glipizide and she took the last dose yesterday morning.  She remains barely responsive on BiPAP.  She is a DNR  Past Medical History:  Diagnosis Date  . Anemia   . Chronic bronchitis ([place])   . Chronic diastolic CHF (congestive heart failure) ([place])   . CKD (chronic kidney disease), stage III ([place])   . COPD (chronic obstructive pulmonary disease) ([place])   . Diabetes mellitus with renal complications ([place])   . ESRD (end stage renal disease) ([place])   . Essential hypertension   . GERD (gastroesophageal reflux disease)   . Obesity   . Peripheral vascular disease ([place])   . Pulmonary hypertension ([place])   . Renal insufficiency 09/19/2015   Stage 3 CKD. Beginning dialysis.   Past Surgical  History:  Procedure Laterality Date  . A/V FISTULAGRAM N/A 09/16/2016   Procedure: A/V Fistulagram;  Surgeon: Algernon Huxley, MD;  Location: [facility];  Service: Cardiovascular;  Laterality: N/A;  . AV FISTULA PLACEMENT Left 10/01/2015   Procedure: ARTERIOVENOUS (AV) FISTULA CREATION ( BRACHIAL CEPHALIC );  Surgeon: Algernon Huxley, MD;  Location: [facility];  Service: Vascular;  Laterality: Left;  . CAPD INSERTION N/A 06/10/2016   Procedure: LAPAROSCOPIC INSERTION CONTINUOUS AMBULATORY PERITONEAL DIALYSIS  (CAPD) CATHETER;  Surgeon: Algernon Huxley, MD;  Location: [facility];  Service: Vascular;  Laterality: N/A;  . DIALYSIS/PERMA CATHETER INSERTION  09/16/2016   Procedure: DIALYSIS/PERMA CATHETER INSERTION;  Surgeon: Algernon Huxley, MD;  Location: [facility];  Service: Cardiovascular;;  . DIALYSIS/PERMA CATHETER REMOVAL N/A 12/06/2016   Procedure: DIALYSIS/PERMA CATHETER REMOVAL;  Surgeon: Algernon Huxley, MD;  Location: [facility];  Service: Cardiovascular;  Laterality: N/A;  . PERIPHERAL VASCULAR CATHETERIZATION N/A 07/21/2015   Procedure: Dialysis/Perma Catheter;  Surgeon: Algernon Huxley, MD;  Location: [facility];  Service: Cardiovascular;  Laterality: N/A;  . PERIPHERAL VASCULAR CATHETERIZATION N/A 12/11/2015   Procedure: Dialysis/Perma Catheter Removal;  Surgeon: Algernon Huxley, MD;  Location: [facility];  Service: Cardiovascular;  Laterality: N/A;   Prior to Admission medications   Medication Sig Start Date End Date Taking? Authorizing Provider  amLODipine (NORVASC) 5 MG tablet Take 5 mg by mouth daily.   Yes [provider]  clopidogrel (PLAVIX) 75 MG tablet Take 75 mg by mouth daily.   Yes [provider]  donepezil (ARICEPT) 5 MG tablet Take 1 tablet (5 mg total) by mouth at bedtime. 09/05/17 10/15/17 Yes Sowles, Drue Stager, MD  empagliflozin (JARDIANCE) 25 MG TABS tablet Take 25 mg by mouth daily.   Yes [provider]  glycopyrrolate  (ROBINUL) 1 MG tablet Take 1 mg by mouth 2 (two) times daily.   Yes [provider]  insulin aspart (NOVOLOG FLEXPEN) 100 UNIT/ML FlexPen Inject 12 Units into the skin 2 (two) times daily.   Yes [provider]  insulin aspart (NOVOLOG) 100 UNIT/ML FlexPen Inject 18 Units into the skin daily. At 1700   Yes [provider]  Insulin Degludec-Liraglutide (XULTOPHY) 100-3.6 UNIT-MG/ML SOPN Inject 50 Units into the skin daily.   Yes [provider]  levETIRAcetam (KEPPRA) 500 MG tablet Take 500 mg by mouth 2 (two) times daily.   Yes [provider]  lipase/protease/amylase (CREON) 12000 units CPEP capsule Take 6,000 Units by mouth 3 (three) times daily before meals.   Yes [provider]  lipase/protease/amylase (CREON) 12000 units CPEP capsule Take 3,000 Units by mouth at bedtime. With snack   Yes [provider]  lisinopril (PRINIVIL,ZESTRIL) 5 MG tablet Take 5 mg by mouth daily.   Yes [provider]  metoprolol succinate (TOPROL-XL) 25 MG 24 hr tablet Take 1 tablet (25 mg total) by mouth daily. 09/05/17  Yes Sowles, Drue Stager, MD  rosuvastatin (CRESTOR) 40 MG tablet Take 1 tablet (40 mg total) by mouth daily. 09/05/17 10/15/17 Yes Steele Sizer, MD  aspirin EC 81 MG tablet Take 81 mg by mouth daily.    [provider]  famotidine (PEPCID) 20 MG tablet Take 1 tablet (20 mg total) by mouth 2 (two) times daily. 09/05/17 10/05/17  Steele Sizer, MD  gabapentin (NEURONTIN) 300 MG capsule Take 1 capsule (300 mg total) by mouth 2 (two) times daily. 09/05/17 10/05/17  Steele Sizer, MD  insulin glargine (LANTUS) 100 UNIT/ML injection Inject 0.1 mLs (10 Units total) into the skin daily. 09/05/17 10/05/17  Steele Sizer, MD  lacosamide 100 MG TABS Take 1 tablet (100 mg total) by mouth 2 (two) times daily. Patient not taking: Reported on 10/15/2017 01/21/17   Fritzi Mandes, MD  promethazine (PHENERGAN) 12.5 MG tablet Take 1 tablet (12.5 mg  total) by mouth every 6 (six) hours as needed for nausea or vomiting. Patient not taking: Reported on 10/15/2017 11/09/16   Stark Klein, MD  sertraline (ZOLOFT) 25 MG tablet Take 1 tablet (25 mg total) by mouth daily. Patient not taking: Reported on 10/15/2017 09/05/17   Steele Sizer, MD   Allergies Allergies  Allergen Reactions  . Ambien [Zolpidem] Other (See Comments)    hallucinations    Family History Family History  Problem Relation Age of Onset  . Hypertension Mother   . Diabetes Mellitus II Mother   . CAD Father   . Hypertension Father   . Heart attack Father    Social History  reports that she quit smoking about 33 years ago. Her smoking use included cigarettes. She has a 2.50 pack-year smoking history. She has never used smokeless tobacco. She reports that she does not drink alcohol or use drugs.  Review Of Systems: Unable to obtain as patient is somnolent  VITAL SIGNS: BP (!) 139/128   Pulse (!) 101   Temp (!) 97.5 F (36.4 C) (Axillary)   Resp 16   Ht 5\' 8"  (  1.727 m)   Wt 94.5 kg   LMP 09/18/2013 (Approximate) Comment: years ago per pt prior to 2015  SpO2 98%   BMI 31.68 kg/m   HEMODYNAMICS:    VENTILATOR SETTINGS:    INTAKE / OUTPUT: No intake/output data recorded.  PHYSICAL EXAMINATION: General: Acutely ill looking, in moderate respiratory distress HEENT: PERRLA, trachea midline, moderate JVD Neuro: Awakens to voice and noxious stimulus, unable to follow commands, moves all extremities Cardiovascular: Apical pulse regular, R4-E3, grade 3 systolic murmur, +2 edema bilaterally Lungs: Increased work of breathing, BiPAP mask in place, diffuse rhonchi and crackles in all lung fields Abdomen: Nondistended, normal bowel sounds in all 4 quadrants, peritoneal dialysis catheter in place Musculoskeletal: Positive range of motion, no joint deformities Skin: Warm and dry  LABS:  BMET Recent Labs  Lab 04/10/2018 1620 03/17/2018 2346  NA 136  --   K 5.9*   --   CL 97*  --   CO2 23  --   BUN 43*  --   CREATININE 7.49*  --   GLUCOSE 39* 68*    Electrolytes Recent Labs  Lab 03/17/2018 1620 03/19/2018 2346  CALCIUM 8.0*  --   PHOS  --  6.2*    CBC Recent Labs  Lab 03/25/2018 1620  WBC 8.2  HGB 11.0*  HCT 35.1*  PLT 234    Coag's No results for input(s): APTT, INR in the last 168 hours.  Sepsis Markers Recent Labs  Lab 04/07/2018 1620 03/20/2018 1640  LATICACIDVEN  --  1.0  PROCALCITON 0.32  --     ABG No results for input(s): PHART, PCO2ART, PO2ART in the last 168 hours.  Liver Enzymes Recent Labs  Lab 04/02/2018 1620  AST 27  ALT 17  ALKPHOS 69  BILITOT 0.7  ALBUMIN 3.4*    Cardiac Enzymes No results for input(s): TROPONINI, PROBNP in the last 168 hours.  Glucose Recent Labs  Lab 03/30/2018 1653 03/17/2018 1817 03/29/2018 1924 03/23/2018 2342 04-09-2018 0059 04/09/2018 0136  GLUCAP 95 100* 73 76 21* 100*    Imaging [place] Chest (pleural Effusion)  Result Date: April 09, 2018 CLINICAL DATA:  Dyspnea.  Follow-up pleural effusion. EXAM: CHEST ULTRASOUND COMPARISON:  Current chest radiographs FINDINGS: Large right pleural effusion with underlying atelectatic or consolidated lung. On the left, no significant pleural fluid is noted. IMPRESSION: 1. Large right pleural effusion. No significant left pleural effusion. Electronically Signed   By: Lajean Manes M.D.   On: 09-Apr-2018 01:06   Dg Chest Port 1 View  Result Date: 09-Apr-2018 CLINICAL DATA:  Pt nurse states pt came back from dialysis with increased SOB and difficulty breathing EXAM: PORTABLE CHEST 1 VIEW COMPARISON:  03/22/2018 at 1617 hours FINDINGS: Cardiac silhouette is mildly enlarged. Moderate to large right and small left pleural effusions are similar previous exam. There is vascular congestion and perihilar hazy airspace opacity most evident on the right, increased from the earlier exam. No other change in the appearance of the lungs. No pneumothorax. IMPRESSION: 1.  Increased perihilar lung opacity most evident on the right. Findings consistent worsened pulmonary edema from the earlier exam. 2. No change in the moderate to large right and small left pleural effusions. Electronically Signed   By: Lajean Manes M.D.   On: Apr 09, 2018 00:17   Dg Chest Port 1 View  Result Date: 03/19/2018 CLINICAL DATA:  Patient with shortness of breath EXAM: PORTABLE CHEST 1 VIEW COMPARISON:  Chest radiograph 02/17/2018 FINDINGS: Monitoring leads overlie the patient. Stable cardiomegaly. Persistent moderate  right pleural effusion. Small left pleural effusion. Underlying opacities. Diffuse bilateral interstitial opacities. IMPRESSION: Moderate right and small left pleural effusions with underlying opacities. Findings suggestive of pulmonary edema. Electronically Signed   By: Lovey Newcomer M.D.   On: 04/07/2018 16:40   STUDIES:  2D echo pending  CULTURES: Blood cultures x2  ANTIBIOTICS: Cefepime and vancomycin given in the ED Not currently on antibiotics  SIGNIFICANT EVENTS: 03/21/2018: Admitted  LINES/TUBES: Peripheral IVs Left arm HD shunt   ASSESSMENT Acute hypoxic respiratory failure secondary to volume overload Acute pulmonary edema Refractory hypoglycemia due to glipizide End-stage renal disease on hemodialysis Hypomagnesemia Seizure secondary to hypoglycemia Acute on chronic diastolic heart failure with exacerbation   PLAN Hemodynamic monitoring per ICU protocol Continues BiPAP and titrate to nasal cannula as tolerated Nephrology following, hemodialysis per nephrology Monitor and correct electrolytes Glucagon, dextrose 10% and dextrose 50% as needed for blood sugar less than 70 mg/dL Discontinue all antidiabetic medications Seizure precautions until blood sugar normalizes Overall prognosis is poor.  Patient is a DNR  Best Practice: Code Status: DNR Diet: N.p.o. until mental status improves GI prophylaxis: Not indicated VTE prophylaxis: SCDs and subcu  heparin  FAMILY  - Updates: Patient's husband updated at bedside  Magdalene S. Tukov-Yual ANP-BC Pulmonary and [place] Pager [phone] or [phone]  NB: This document was prepared using Dragon voice recognition software and may include unintentional dictation errors.    04/08/18, 1:57 AM

## 2018-04-12 DEATH — deceased

## 2019-01-01 IMAGING — CT CT ANGIO NECK
1 of 11 series · 5 of 33 positions shown · IV contrast (APPLIED)
Comparison: None.

CLINICAL DATA: Persistent vertigo, central.

EXAM:
CT ANGIOGRAPHY HEAD AND NECK
TECHNIQUE: Multidetector CT imaging of the head and neck was performed using
the standard protocol during bolus administration of intravenous
contrast. Multiplanar CT image reconstructions and MIPs were
obtained to evaluate the vascular anatomy. Carotid stenosis
measurements (when applicable) are obtained utilizing NASCET
criteria, using the distal internal carotid diameter as the
denominator.
CONTRAST:  75 cc Isovue 370 intravenous

[Series 7: ax thin · axial · 0.37mm/px · z∈[+216,+435]mm · 5 of 341 slices shown]
[im 57/341  soft-tissue]
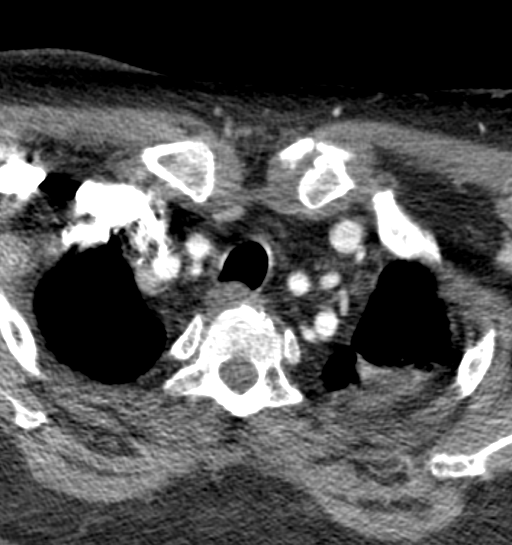
[im 114/341  bone]
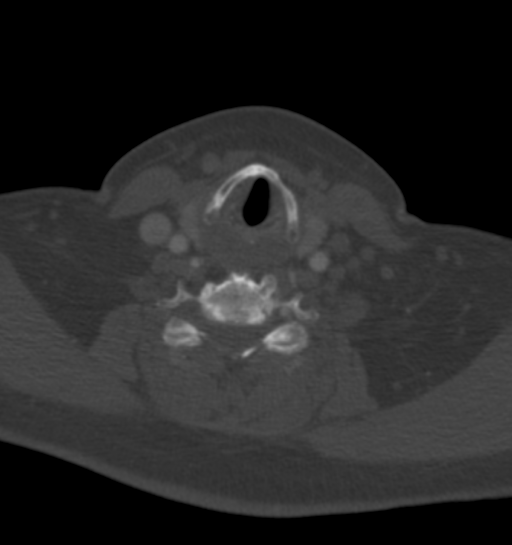
[im 171/341  soft-tissue]
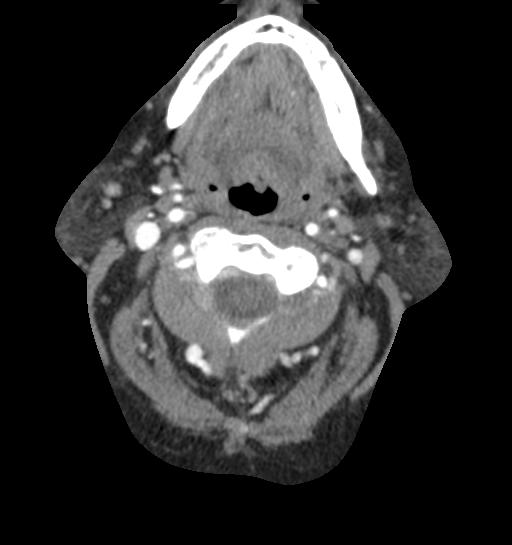
[im 227/341  bone]
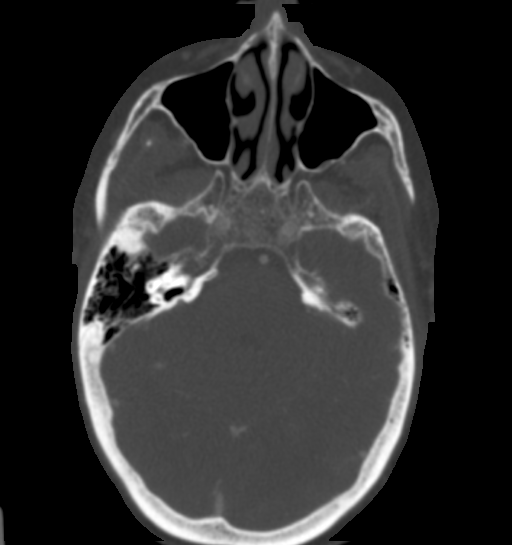
[im 284/341  soft-tissue]
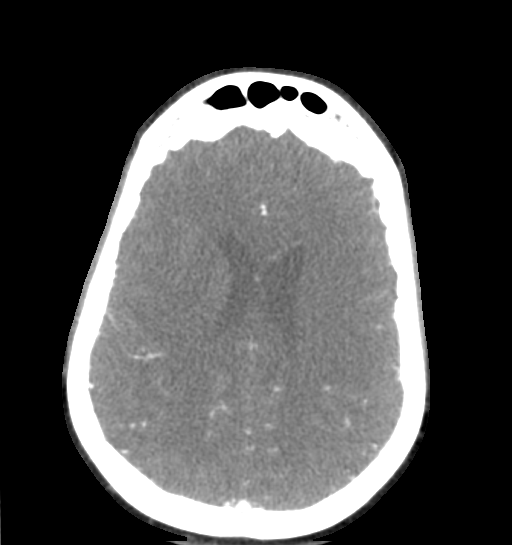

[5 of 33 positions shown; findings below may reference images not displayed]

FINDINGS: CT head:

Brain: No evidence of infarct, hemorrhage, hydrocephalus, or
masslike finding.

Vascular: See below.

Skull: Negative.

Sinuses and orbits: Negative.

CTA NECK FINDINGS

Aortic arch: Atherosclerotic plaque on the aorta. Three vessel
branching. No acute finding.

Right carotid system: Mild partially calcified plaque at the common
carotid bifurcation. No stenosis, ulceration, or dissection.

Left carotid system: Prominent plaque at the CCA origin with 
50%
luminal stenosis. Moderate peripherally calcified centrally
low-density plaque at the common carotid bifurcation with 25%
proximal ICA narrowing. No definite ulceration. There is mild motion
artifact at the level of the left CCA bifurcation. Mild proximal ECA
narrowing.

Vertebral arteries: No proximal subclavian stenosis. Both vertebral
arteries are smooth and widely patent to the dura.

Skeleton: No acute or aggressive finding.

Other neck: No incidental mass or inflammation noted.

Upper chest: Chronic scar-like opacity at the left apex with
calcification, also seen on chest CT 01/08/2015. Hazy appearance of
the upper lungs which could be from atelectasis. Fissural fluid in
the upper left major fissure. Chest x-ray performed today.

Review of the MIP images confirms the above findings

CTA HEAD FINDINGS

Anterior circulation: Atherosclerotic plaque on the carotid siphons
without stenosis or ulceration. No branch occlusion, beading, or
aneurysm.

Posterior circulation: Essentially codominant vertebral arteries.
Vertebral and basilar arteries are smooth and widely patent

Venous sinuses: Patent

Anatomic variants: None significant

Delayed phase: No abnormal intracranial enhancement.

Review of the MIP images confirms the above findings
IMPRESSION: 1. No acute finding.  No evidence of vertebrobasilar insufficiency.
2. Atherosclerosis, most notable in the left carotid circulation
where there is 50% common carotid origins stenosis and 25% proximal
ICA stenosis.
3.

## 2019-03-28 IMAGING — DX DG CHEST 1V
1 series · 1 of 1 positions shown · non-contrast
Comparison: 09/08/2016 and prior exams

CLINICAL DATA: Weakness and fall.

EXAM:
CHEST 1 VIEW

[chest ap]
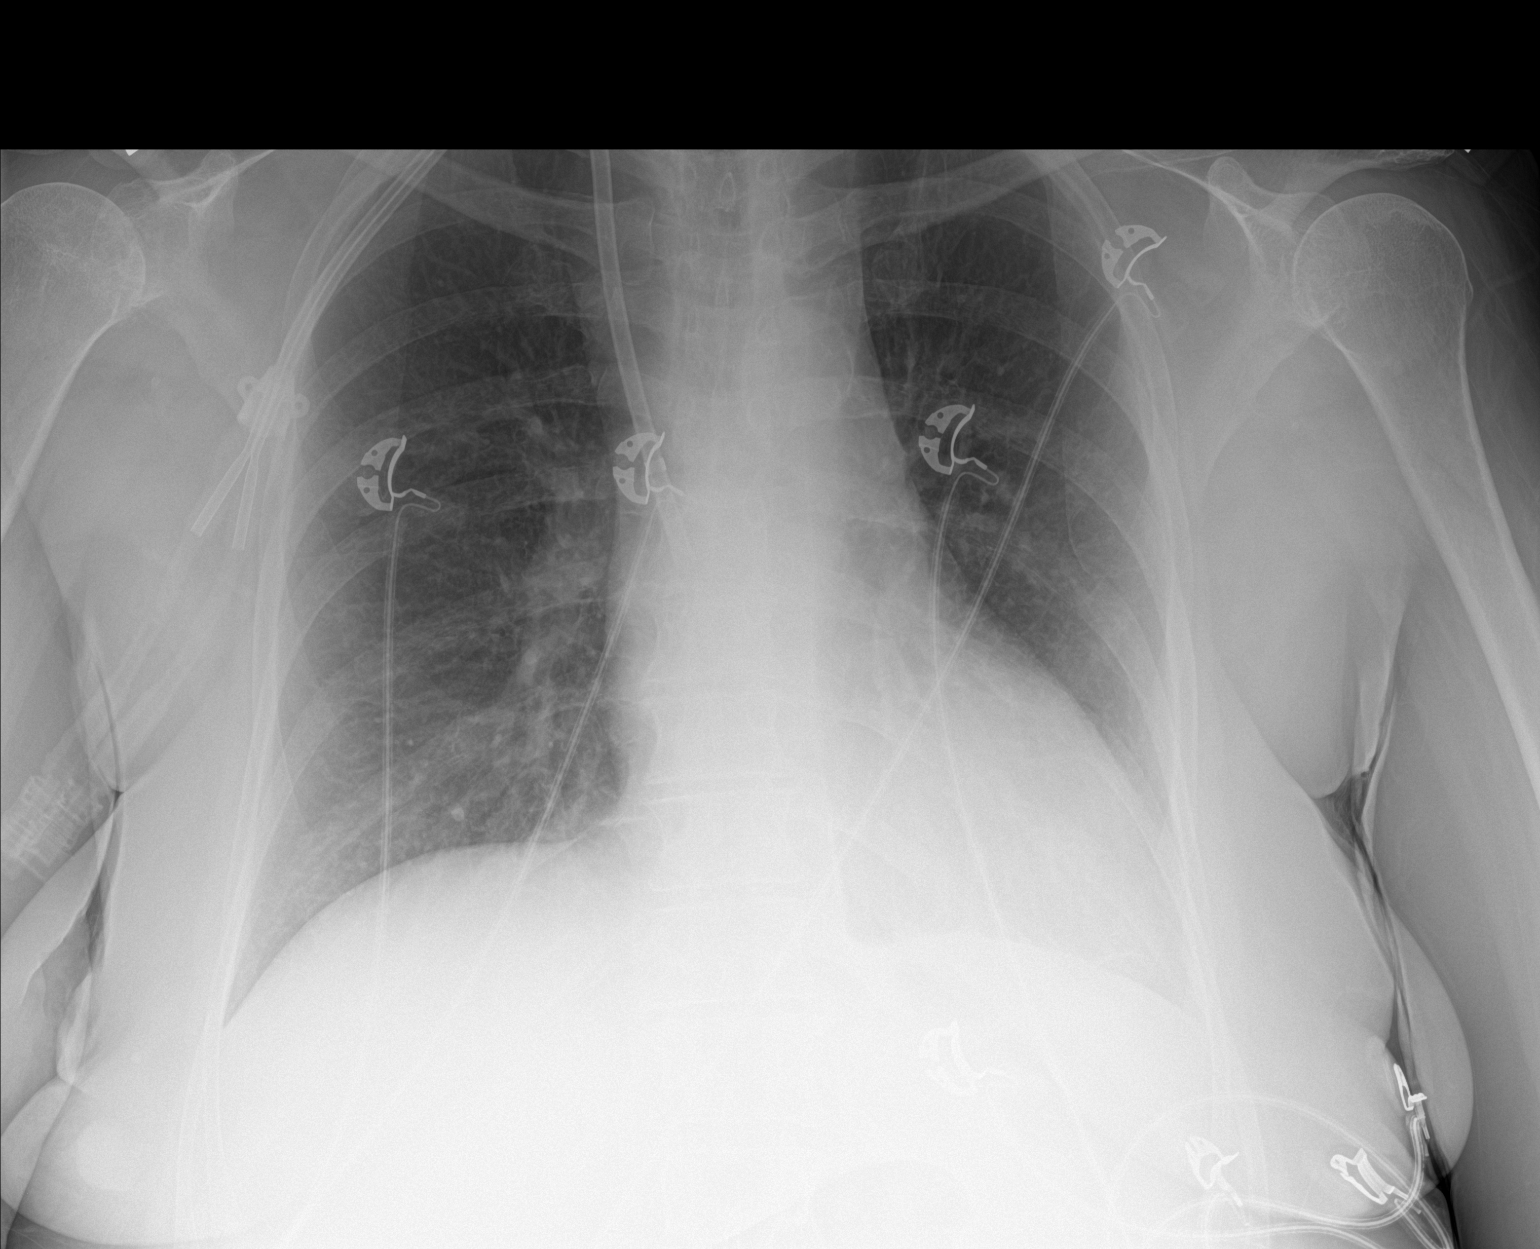

[1 of 1 positions shown; findings below may reference images not displayed]

FINDINGS: The cardiomediastinal silhouette is unremarkable.

A right IJ central venous catheter is noted with tip overlying the
lower SVC.

There is no evidence of focal airspace disease, pulmonary edema,
suspicious pulmonary nodule/mass, pleural effusion, or pneumothorax.

Left apical pleuroparenchymal scarring again noted.

No acute bony abnormalities are identified.
IMPRESSION: No active disease.
